# Patient Record
Sex: Female | Born: 1960
Health system: Southern US, Community
[De-identification: ages and names within clinical notes are randomized; demographics above are authoritative.]

## PROBLEM LIST (undated history)

## (undated) DIAGNOSIS — M51379 Other intervertebral disc degeneration, lumbosacral region without mention of lumbar back pain or lower extremity pain: Secondary | ICD-10-CM

## (undated) DIAGNOSIS — E785 Hyperlipidemia, unspecified: Secondary | ICD-10-CM

## (undated) DIAGNOSIS — M5382 Other specified dorsopathies, cervical region: Secondary | ICD-10-CM

## (undated) DIAGNOSIS — M47816 Spondylosis without myelopathy or radiculopathy, lumbar region: Secondary | ICD-10-CM

## (undated) DIAGNOSIS — T7840XA Allergy, unspecified, initial encounter: Secondary | ICD-10-CM

## (undated) DIAGNOSIS — J449 Chronic obstructive pulmonary disease, unspecified: Secondary | ICD-10-CM

## (undated) DIAGNOSIS — G894 Chronic pain syndrome: Secondary | ICD-10-CM

## (undated) DIAGNOSIS — M1710 Unilateral primary osteoarthritis, unspecified knee: Secondary | ICD-10-CM

## (undated) DIAGNOSIS — G43019 Migraine without aura, intractable, without status migrainosus: Secondary | ICD-10-CM

## (undated) DIAGNOSIS — F32A Depression, unspecified: Secondary | ICD-10-CM

## (undated) DIAGNOSIS — F329 Major depressive disorder, single episode, unspecified: Secondary | ICD-10-CM

## (undated) DIAGNOSIS — M5106 Intervertebral disc disorders with myelopathy, lumbar region: Secondary | ICD-10-CM

## (undated) DIAGNOSIS — K219 Gastro-esophageal reflux disease without esophagitis: Secondary | ICD-10-CM

## (undated) DIAGNOSIS — E079 Disorder of thyroid, unspecified: Secondary | ICD-10-CM

## (undated) DIAGNOSIS — F419 Anxiety disorder, unspecified: Secondary | ICD-10-CM

## (undated) DIAGNOSIS — M47817 Spondylosis without myelopathy or radiculopathy, lumbosacral region: Secondary | ICD-10-CM

## (undated) DIAGNOSIS — C801 Malignant (primary) neoplasm, unspecified: Secondary | ICD-10-CM

## (undated) DIAGNOSIS — M5137 Other intervertebral disc degeneration, lumbosacral region: Secondary | ICD-10-CM

## (undated) HISTORY — PX: OTHER SURGICAL HISTORY: SHX169

## (undated) HISTORY — DX: Depression, unspecified: F32.A

## (undated) HISTORY — DX: Disorder of thyroid, unspecified: E07.9

## (undated) HISTORY — DX: Hyperlipidemia, unspecified: E78.5

## (undated) HISTORY — PX: COLONOSCOPY: SHX174

## (undated) HISTORY — DX: Migraine without aura, intractable, without status migrainosus: G43.019

## (undated) HISTORY — DX: Malignant (primary) neoplasm, unspecified: C80.1

## (undated) HISTORY — DX: Major depressive disorder, single episode, unspecified: F32.9

## (undated) HISTORY — DX: Other intervertebral disc degeneration, lumbosacral region: M51.37

## (undated) HISTORY — DX: Allergy, unspecified, initial encounter: T78.40XA

## (undated) HISTORY — DX: Chronic obstructive pulmonary disease, unspecified: J44.9

## (undated) HISTORY — DX: Spondylosis without myelopathy or radiculopathy, lumbar region: M47.816

## (undated) HISTORY — DX: Other specified dorsopathies, cervical region: M53.82

## (undated) HISTORY — PX: JOINT REPLACEMENT: SHX530

## (undated) HISTORY — DX: Intervertebral disc disorders with myelopathy, lumbar region: M51.06

## (undated) HISTORY — PX: BREAST CYST ASPIRATION: SHX578

## (undated) HISTORY — PX: THYROID SURGERY: SHX805

## (undated) HISTORY — PX: KNEE ARTHROPLASTY: SHX992

## (undated) HISTORY — DX: Other intervertebral disc degeneration, lumbosacral region without mention of lumbar back pain or lower extremity pain: M51.379

## (undated) HISTORY — DX: Anxiety disorder, unspecified: F41.9

## (undated) HISTORY — DX: Spondylosis without myelopathy or radiculopathy, lumbosacral region: M47.817

## (undated) HISTORY — DX: Unilateral primary osteoarthritis, unspecified knee: M17.10

## (undated) HISTORY — DX: Gastro-esophageal reflux disease without esophagitis: K21.9

## (undated) HISTORY — DX: Chronic pain syndrome: G89.4

## (undated) HISTORY — PX: COSMETIC SURGERY: SHX468

---
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
## 1993-03-09 HISTORY — PX: OTHER SURGICAL HISTORY: SHX169

## 1993-07-07 HISTORY — PX: CARPAL TUNNEL RELEASE: SHX101

## 1997-10-07 HISTORY — PX: TUBAL LIGATION: SHX77

## 2001-08-06 HISTORY — PX: HEMORROIDECTOMY: SUR656

## 2002-04-30 ENCOUNTER — Encounter: Admission: RE | Admit: 2002-04-30 | Discharge: 2002-07-29 | Payer: Self-pay

## 2002-06-07 HISTORY — PX: ABDOMINAL HYSTERECTOMY: SHX81

## 2002-08-26 ENCOUNTER — Encounter
Admission: RE | Admit: 2002-08-26 | Discharge: 2002-11-24 | Payer: Self-pay | Admitting: Physical Medicine & Rehabilitation

## 2003-01-21 ENCOUNTER — Encounter
Admission: RE | Admit: 2003-01-21 | Discharge: 2003-04-21 | Payer: Self-pay | Admitting: Physical Medicine & Rehabilitation

## 2003-02-07 HISTORY — PX: KNEE ARTHROSCOPY: SUR90

## 2003-05-27 ENCOUNTER — Encounter: Admission: RE | Admit: 2003-05-27 | Discharge: 2003-08-25 | Payer: Self-pay | Admitting: Family Medicine

## 2003-09-09 ENCOUNTER — Encounter
Admission: RE | Admit: 2003-09-09 | Discharge: 2003-11-09 | Payer: Self-pay | Admitting: Physical Medicine & Rehabilitation

## 2003-11-09 ENCOUNTER — Encounter
Admission: RE | Admit: 2003-11-09 | Discharge: 2004-02-07 | Payer: Self-pay | Admitting: Physical Medicine & Rehabilitation

## 2003-11-10 ENCOUNTER — Ambulatory Visit: Payer: Self-pay | Admitting: Physical Medicine & Rehabilitation

## 2003-12-08 HISTORY — PX: REPLACEMENT TOTAL KNEE: SUR1224

## 2003-12-25 ENCOUNTER — Inpatient Hospital Stay (HOSPITAL_COMMUNITY): Admission: RE | Admit: 2003-12-25 | Discharge: 2003-12-28 | Payer: Self-pay | Admitting: Orthopedic Surgery

## 2004-03-10 ENCOUNTER — Encounter
Admission: RE | Admit: 2004-03-10 | Discharge: 2004-06-03 | Payer: Self-pay | Admitting: Physical Medicine & Rehabilitation

## 2004-03-11 ENCOUNTER — Ambulatory Visit: Payer: Self-pay | Admitting: Physical Medicine & Rehabilitation

## 2004-06-03 ENCOUNTER — Encounter
Admission: RE | Admit: 2004-06-03 | Discharge: 2004-09-01 | Payer: Self-pay | Admitting: Physical Medicine & Rehabilitation

## 2004-06-07 ENCOUNTER — Ambulatory Visit: Payer: Self-pay | Admitting: Physical Medicine & Rehabilitation

## 2004-09-16 ENCOUNTER — Ambulatory Visit: Payer: Self-pay | Admitting: Physical Medicine & Rehabilitation

## 2004-09-16 ENCOUNTER — Encounter
Admission: RE | Admit: 2004-09-16 | Discharge: 2004-12-15 | Payer: Self-pay | Admitting: Physical Medicine & Rehabilitation

## 2004-10-27 ENCOUNTER — Encounter
Admission: RE | Admit: 2004-10-27 | Discharge: 2004-10-27 | Payer: Self-pay | Admitting: Physical Medicine and Rehabilitation

## 2004-11-07 ENCOUNTER — Other Ambulatory Visit: Admission: RE | Admit: 2004-11-07 | Discharge: 2004-11-07 | Payer: Self-pay | Admitting: Interventional Radiology

## 2004-11-07 ENCOUNTER — Encounter
Admission: RE | Admit: 2004-11-07 | Discharge: 2004-11-07 | Payer: Self-pay | Admitting: Physical Medicine and Rehabilitation

## 2004-11-07 ENCOUNTER — Encounter (INDEPENDENT_AMBULATORY_CARE_PROVIDER_SITE_OTHER): Payer: Self-pay | Admitting: Specialist

## 2004-11-17 ENCOUNTER — Ambulatory Visit: Payer: Self-pay | Admitting: Physical Medicine & Rehabilitation

## 2005-01-06 HISTORY — PX: THYROID LOBECTOMY: SHX420

## 2005-01-12 ENCOUNTER — Encounter (INDEPENDENT_AMBULATORY_CARE_PROVIDER_SITE_OTHER): Payer: Self-pay | Admitting: Specialist

## 2005-01-12 ENCOUNTER — Ambulatory Visit (HOSPITAL_COMMUNITY): Admission: RE | Admit: 2005-01-12 | Discharge: 2005-01-13 | Payer: Self-pay | Admitting: Surgery

## 2005-04-05 ENCOUNTER — Ambulatory Visit (HOSPITAL_COMMUNITY): Admission: RE | Admit: 2005-04-05 | Discharge: 2005-04-05 | Payer: Self-pay | Admitting: Surgery

## 2005-04-10 ENCOUNTER — Ambulatory Visit (HOSPITAL_COMMUNITY): Admission: RE | Admit: 2005-04-10 | Discharge: 2005-04-10 | Payer: Self-pay

## 2005-06-26 ENCOUNTER — Encounter (INDEPENDENT_AMBULATORY_CARE_PROVIDER_SITE_OTHER): Payer: Self-pay | Admitting: Specialist

## 2005-06-26 ENCOUNTER — Ambulatory Visit (HOSPITAL_BASED_OUTPATIENT_CLINIC_OR_DEPARTMENT_OTHER): Admission: RE | Admit: 2005-06-26 | Discharge: 2005-06-26 | Payer: Self-pay | Admitting: Plastic Surgery

## 2005-06-28 ENCOUNTER — Ambulatory Visit (HOSPITAL_BASED_OUTPATIENT_CLINIC_OR_DEPARTMENT_OTHER): Admission: RE | Admit: 2005-06-28 | Discharge: 2005-06-28 | Payer: Self-pay | Admitting: Plastic Surgery

## 2005-07-11 ENCOUNTER — Ambulatory Visit (HOSPITAL_BASED_OUTPATIENT_CLINIC_OR_DEPARTMENT_OTHER): Admission: RE | Admit: 2005-07-11 | Discharge: 2005-07-11 | Payer: Self-pay | Admitting: Plastic Surgery

## 2005-07-11 ENCOUNTER — Encounter (INDEPENDENT_AMBULATORY_CARE_PROVIDER_SITE_OTHER): Payer: Self-pay | Admitting: *Deleted

## 2005-08-02 ENCOUNTER — Ambulatory Visit (HOSPITAL_BASED_OUTPATIENT_CLINIC_OR_DEPARTMENT_OTHER): Admission: RE | Admit: 2005-08-02 | Discharge: 2005-08-02 | Payer: Self-pay | Admitting: Plastic Surgery

## 2005-08-29 ENCOUNTER — Encounter: Admission: RE | Admit: 2005-08-29 | Discharge: 2005-08-29 | Payer: Self-pay | Admitting: Surgery

## 2005-11-06 ENCOUNTER — Encounter
Admission: RE | Admit: 2005-11-06 | Discharge: 2006-02-04 | Payer: Self-pay | Admitting: Physical Medicine & Rehabilitation

## 2005-11-06 ENCOUNTER — Ambulatory Visit: Payer: Self-pay | Admitting: Physical Medicine & Rehabilitation

## 2005-12-04 ENCOUNTER — Encounter
Admission: RE | Admit: 2005-12-04 | Discharge: 2006-03-04 | Payer: Self-pay | Admitting: Physical Medicine & Rehabilitation

## 2005-12-27 ENCOUNTER — Encounter
Admission: RE | Admit: 2005-12-27 | Discharge: 2006-03-27 | Payer: Self-pay | Admitting: Physical Medicine & Rehabilitation

## 2006-01-01 ENCOUNTER — Ambulatory Visit: Payer: Self-pay | Admitting: Physical Medicine & Rehabilitation

## 2006-02-07 ENCOUNTER — Encounter
Admission: RE | Admit: 2006-02-07 | Discharge: 2006-05-08 | Payer: Self-pay | Admitting: Physical Medicine & Rehabilitation

## 2006-02-15 ENCOUNTER — Ambulatory Visit: Payer: Self-pay | Admitting: Physical Medicine & Rehabilitation

## 2006-02-28 ENCOUNTER — Encounter: Admission: RE | Admit: 2006-02-28 | Discharge: 2006-02-28 | Payer: Self-pay | Admitting: Surgery

## 2006-03-15 ENCOUNTER — Encounter
Admission: RE | Admit: 2006-03-15 | Discharge: 2006-06-13 | Payer: Self-pay | Admitting: Physical Medicine & Rehabilitation

## 2006-03-19 ENCOUNTER — Ambulatory Visit: Payer: Self-pay | Admitting: Physical Medicine & Rehabilitation

## 2006-04-02 ENCOUNTER — Ambulatory Visit: Payer: Self-pay | Admitting: Physical Medicine & Rehabilitation

## 2006-04-13 ENCOUNTER — Ambulatory Visit: Payer: Self-pay | Admitting: Physical Medicine & Rehabilitation

## 2006-06-20 ENCOUNTER — Ambulatory Visit: Payer: Self-pay | Admitting: Physical Medicine & Rehabilitation

## 2006-06-20 ENCOUNTER — Encounter
Admission: RE | Admit: 2006-06-20 | Discharge: 2006-09-18 | Payer: Self-pay | Admitting: Physical Medicine & Rehabilitation

## 2006-07-23 ENCOUNTER — Ambulatory Visit: Payer: Self-pay | Admitting: Physical Medicine & Rehabilitation

## 2006-09-19 ENCOUNTER — Encounter
Admission: RE | Admit: 2006-09-19 | Discharge: 2006-10-18 | Payer: Self-pay | Admitting: Physical Medicine & Rehabilitation

## 2006-09-20 ENCOUNTER — Ambulatory Visit: Payer: Self-pay | Admitting: Physical Medicine & Rehabilitation

## 2006-11-19 ENCOUNTER — Encounter
Admission: RE | Admit: 2006-11-19 | Discharge: 2007-02-17 | Payer: Self-pay | Admitting: Physical Medicine & Rehabilitation

## 2006-11-29 ENCOUNTER — Ambulatory Visit: Payer: Self-pay | Admitting: Physical Medicine & Rehabilitation

## 2006-12-13 ENCOUNTER — Inpatient Hospital Stay (HOSPITAL_COMMUNITY): Admission: RE | Admit: 2006-12-13 | Discharge: 2006-12-16 | Payer: Self-pay | Admitting: Orthopedic Surgery

## 2007-02-12 ENCOUNTER — Ambulatory Visit: Payer: Self-pay | Admitting: Physical Medicine & Rehabilitation

## 2007-03-15 ENCOUNTER — Encounter
Admission: RE | Admit: 2007-03-15 | Discharge: 2007-03-18 | Payer: Self-pay | Admitting: Physical Medicine & Rehabilitation

## 2007-03-15 ENCOUNTER — Ambulatory Visit: Payer: Self-pay | Admitting: Physical Medicine & Rehabilitation

## 2007-05-15 ENCOUNTER — Ambulatory Visit: Payer: Self-pay | Admitting: Physical Medicine & Rehabilitation

## 2007-05-15 ENCOUNTER — Encounter
Admission: RE | Admit: 2007-05-15 | Discharge: 2007-05-15 | Payer: Self-pay | Admitting: Physical Medicine & Rehabilitation

## 2007-08-02 ENCOUNTER — Encounter
Admission: RE | Admit: 2007-08-02 | Discharge: 2007-10-31 | Payer: Self-pay | Admitting: Physical Medicine & Rehabilitation

## 2007-08-02 ENCOUNTER — Ambulatory Visit: Payer: Self-pay | Admitting: Physical Medicine & Rehabilitation

## 2007-09-09 ENCOUNTER — Ambulatory Visit: Payer: Self-pay | Admitting: Physical Medicine & Rehabilitation

## 2007-10-16 ENCOUNTER — Ambulatory Visit: Payer: Self-pay | Admitting: Physical Medicine & Rehabilitation

## 2007-11-11 ENCOUNTER — Encounter
Admission: RE | Admit: 2007-11-11 | Discharge: 2008-02-09 | Payer: Self-pay | Admitting: Physical Medicine & Rehabilitation

## 2007-11-12 ENCOUNTER — Ambulatory Visit: Payer: Self-pay | Admitting: Physical Medicine & Rehabilitation

## 2007-12-10 ENCOUNTER — Ambulatory Visit: Payer: Self-pay | Admitting: Physical Medicine & Rehabilitation

## 2008-01-10 ENCOUNTER — Ambulatory Visit: Payer: Self-pay | Admitting: Physical Medicine & Rehabilitation

## 2008-02-11 ENCOUNTER — Encounter
Admission: RE | Admit: 2008-02-11 | Discharge: 2008-05-11 | Payer: Self-pay | Admitting: Physical Medicine & Rehabilitation

## 2008-02-12 ENCOUNTER — Ambulatory Visit: Payer: Self-pay | Admitting: Physical Medicine & Rehabilitation

## 2008-03-11 ENCOUNTER — Ambulatory Visit: Payer: Self-pay | Admitting: Physical Medicine & Rehabilitation

## 2008-03-20 ENCOUNTER — Encounter: Admission: RE | Admit: 2008-03-20 | Discharge: 2008-03-20 | Payer: Self-pay | Admitting: Surgery

## 2008-04-08 ENCOUNTER — Ambulatory Visit: Payer: Self-pay | Admitting: Physical Medicine & Rehabilitation

## 2008-05-05 ENCOUNTER — Encounter
Admission: RE | Admit: 2008-05-05 | Discharge: 2008-08-03 | Payer: Self-pay | Admitting: Physical Medicine & Rehabilitation

## 2008-05-11 ENCOUNTER — Ambulatory Visit: Payer: Self-pay | Admitting: Physical Medicine & Rehabilitation

## 2008-06-05 ENCOUNTER — Ambulatory Visit: Payer: Self-pay | Admitting: Physical Medicine & Rehabilitation

## 2008-07-03 ENCOUNTER — Ambulatory Visit: Payer: Self-pay | Admitting: Physical Medicine & Rehabilitation

## 2008-07-30 ENCOUNTER — Encounter
Admission: RE | Admit: 2008-07-30 | Discharge: 2008-10-28 | Payer: Self-pay | Admitting: Physical Medicine & Rehabilitation

## 2008-08-04 ENCOUNTER — Ambulatory Visit: Payer: Self-pay | Admitting: Physical Medicine & Rehabilitation

## 2008-09-02 ENCOUNTER — Ambulatory Visit: Payer: Self-pay | Admitting: Physical Medicine & Rehabilitation

## 2008-10-02 ENCOUNTER — Ambulatory Visit: Payer: Self-pay | Admitting: Physical Medicine & Rehabilitation

## 2008-10-30 ENCOUNTER — Encounter
Admission: RE | Admit: 2008-10-30 | Discharge: 2009-01-28 | Payer: Self-pay | Admitting: Physical Medicine & Rehabilitation

## 2008-11-02 ENCOUNTER — Ambulatory Visit: Payer: Self-pay | Admitting: Physical Medicine & Rehabilitation

## 2008-12-01 ENCOUNTER — Ambulatory Visit: Payer: Self-pay | Admitting: Physical Medicine & Rehabilitation

## 2008-12-29 ENCOUNTER — Ambulatory Visit: Payer: Self-pay | Admitting: Physical Medicine & Rehabilitation

## 2009-01-27 ENCOUNTER — Ambulatory Visit: Payer: Self-pay | Admitting: Physical Medicine & Rehabilitation

## 2009-02-18 ENCOUNTER — Encounter
Admission: RE | Admit: 2009-02-18 | Discharge: 2009-05-19 | Payer: Self-pay | Admitting: Physical Medicine & Rehabilitation

## 2009-02-24 ENCOUNTER — Ambulatory Visit: Payer: Self-pay | Admitting: Physical Medicine & Rehabilitation

## 2009-03-25 ENCOUNTER — Ambulatory Visit: Payer: Self-pay | Admitting: Physical Medicine & Rehabilitation

## 2009-04-23 ENCOUNTER — Ambulatory Visit: Payer: Self-pay | Admitting: Physical Medicine & Rehabilitation

## 2009-05-19 ENCOUNTER — Ambulatory Visit: Payer: Self-pay | Admitting: Physical Medicine & Rehabilitation

## 2009-06-15 ENCOUNTER — Encounter
Admission: RE | Admit: 2009-06-15 | Discharge: 2009-09-13 | Payer: Self-pay | Admitting: Physical Medicine & Rehabilitation

## 2009-06-17 ENCOUNTER — Ambulatory Visit: Payer: Self-pay | Admitting: Physical Medicine & Rehabilitation

## 2009-07-15 ENCOUNTER — Ambulatory Visit: Payer: Self-pay | Admitting: Physical Medicine & Rehabilitation

## 2009-08-13 ENCOUNTER — Ambulatory Visit: Payer: Self-pay | Admitting: Physical Medicine & Rehabilitation

## 2009-09-16 ENCOUNTER — Ambulatory Visit: Payer: Self-pay | Admitting: Physical Medicine & Rehabilitation

## 2009-09-16 ENCOUNTER — Encounter
Admission: RE | Admit: 2009-09-16 | Discharge: 2009-12-15 | Payer: Self-pay | Admitting: Physical Medicine & Rehabilitation

## 2009-10-14 ENCOUNTER — Ambulatory Visit: Payer: Self-pay | Admitting: Physical Medicine & Rehabilitation

## 2009-11-15 ENCOUNTER — Ambulatory Visit: Payer: Self-pay | Admitting: Physical Medicine & Rehabilitation

## 2009-12-09 ENCOUNTER — Ambulatory Visit: Payer: Self-pay | Admitting: Physical Medicine & Rehabilitation

## 2010-01-04 ENCOUNTER — Encounter
Admission: RE | Admit: 2010-01-04 | Discharge: 2010-02-25 | Payer: Self-pay | Source: Home / Self Care | Attending: Physical Medicine & Rehabilitation | Admitting: Physical Medicine & Rehabilitation

## 2010-01-06 ENCOUNTER — Ambulatory Visit: Payer: Self-pay | Admitting: Physical Medicine & Rehabilitation

## 2010-02-03 ENCOUNTER — Ambulatory Visit: Payer: Self-pay | Admitting: Physical Medicine & Rehabilitation

## 2010-02-03 DIAGNOSIS — M47816 Spondylosis without myelopathy or radiculopathy, lumbar region: Secondary | ICD-10-CM

## 2010-02-25 ENCOUNTER — Encounter
Admission: RE | Admit: 2010-02-25 | Discharge: 2010-03-02 | Payer: Self-pay | Source: Home / Self Care | Attending: Physical Medicine & Rehabilitation | Admitting: Physical Medicine & Rehabilitation

## 2010-03-02 ENCOUNTER — Ambulatory Visit
Admission: RE | Admit: 2010-03-02 | Discharge: 2010-03-02 | Payer: Self-pay | Source: Home / Self Care | Attending: Physical Medicine & Rehabilitation | Admitting: Physical Medicine & Rehabilitation

## 2010-03-31 ENCOUNTER — Ambulatory Visit: Payer: Self-pay

## 2010-04-07 ENCOUNTER — Encounter: Payer: Medicare Other | Attending: Physical Medicine & Rehabilitation

## 2010-04-07 ENCOUNTER — Ambulatory Visit

## 2010-04-07 DIAGNOSIS — G8929 Other chronic pain: Secondary | ICD-10-CM | POA: Insufficient documentation

## 2010-04-07 DIAGNOSIS — M1710 Unilateral primary osteoarthritis, unspecified knee: Secondary | ICD-10-CM | POA: Insufficient documentation

## 2010-04-07 DIAGNOSIS — M47817 Spondylosis without myelopathy or radiculopathy, lumbosacral region: Secondary | ICD-10-CM | POA: Insufficient documentation

## 2010-04-07 DIAGNOSIS — M5382 Other specified dorsopathies, cervical region: Secondary | ICD-10-CM

## 2010-04-07 DIAGNOSIS — F17200 Nicotine dependence, unspecified, uncomplicated: Secondary | ICD-10-CM | POA: Insufficient documentation

## 2010-04-07 DIAGNOSIS — M129 Arthropathy, unspecified: Secondary | ICD-10-CM | POA: Insufficient documentation

## 2010-04-07 DIAGNOSIS — M5137 Other intervertebral disc degeneration, lumbosacral region: Secondary | ICD-10-CM

## 2010-04-07 DIAGNOSIS — G43909 Migraine, unspecified, not intractable, without status migrainosus: Secondary | ICD-10-CM | POA: Insufficient documentation

## 2010-04-07 DIAGNOSIS — M545 Low back pain, unspecified: Secondary | ICD-10-CM | POA: Insufficient documentation

## 2010-04-07 DIAGNOSIS — M51379 Other intervertebral disc degeneration, lumbosacral region without mention of lumbar back pain or lower extremity pain: Secondary | ICD-10-CM

## 2010-04-07 DIAGNOSIS — M5380 Other specified dorsopathies, site unspecified: Secondary | ICD-10-CM

## 2010-05-10 ENCOUNTER — Encounter: Payer: Medicare Other | Attending: Physical Medicine & Rehabilitation

## 2010-05-10 DIAGNOSIS — M79609 Pain in unspecified limb: Secondary | ICD-10-CM | POA: Insufficient documentation

## 2010-05-10 DIAGNOSIS — F341 Dysthymic disorder: Secondary | ICD-10-CM | POA: Insufficient documentation

## 2010-05-10 DIAGNOSIS — M1710 Unilateral primary osteoarthritis, unspecified knee: Secondary | ICD-10-CM

## 2010-05-10 DIAGNOSIS — M5380 Other specified dorsopathies, site unspecified: Secondary | ICD-10-CM

## 2010-05-10 DIAGNOSIS — G43909 Migraine, unspecified, not intractable, without status migrainosus: Secondary | ICD-10-CM | POA: Insufficient documentation

## 2010-05-10 DIAGNOSIS — M5137 Other intervertebral disc degeneration, lumbosacral region: Secondary | ICD-10-CM

## 2010-05-10 DIAGNOSIS — M5382 Other specified dorsopathies, cervical region: Secondary | ICD-10-CM

## 2010-05-10 DIAGNOSIS — M545 Low back pain, unspecified: Secondary | ICD-10-CM | POA: Insufficient documentation

## 2010-05-10 DIAGNOSIS — M47817 Spondylosis without myelopathy or radiculopathy, lumbosacral region: Secondary | ICD-10-CM | POA: Insufficient documentation

## 2010-05-10 DIAGNOSIS — Z79899 Other long term (current) drug therapy: Secondary | ICD-10-CM | POA: Insufficient documentation

## 2010-05-10 DIAGNOSIS — M51379 Other intervertebral disc degeneration, lumbosacral region without mention of lumbar back pain or lower extremity pain: Secondary | ICD-10-CM

## 2010-06-08 ENCOUNTER — Encounter: Payer: Medicare Other | Attending: Physical Medicine & Rehabilitation | Admitting: Physical Medicine & Rehabilitation

## 2010-06-08 DIAGNOSIS — F329 Major depressive disorder, single episode, unspecified: Secondary | ICD-10-CM

## 2010-06-08 DIAGNOSIS — M25579 Pain in unspecified ankle and joints of unspecified foot: Secondary | ICD-10-CM | POA: Insufficient documentation

## 2010-06-08 DIAGNOSIS — M545 Low back pain, unspecified: Secondary | ICD-10-CM | POA: Insufficient documentation

## 2010-06-08 DIAGNOSIS — Z96659 Presence of unspecified artificial knee joint: Secondary | ICD-10-CM | POA: Insufficient documentation

## 2010-06-08 DIAGNOSIS — G43909 Migraine, unspecified, not intractable, without status migrainosus: Secondary | ICD-10-CM | POA: Insufficient documentation

## 2010-06-08 DIAGNOSIS — M1710 Unilateral primary osteoarthritis, unspecified knee: Secondary | ICD-10-CM | POA: Insufficient documentation

## 2010-06-08 DIAGNOSIS — G8929 Other chronic pain: Secondary | ICD-10-CM | POA: Insufficient documentation

## 2010-06-08 DIAGNOSIS — M25569 Pain in unspecified knee: Secondary | ICD-10-CM | POA: Insufficient documentation

## 2010-06-08 DIAGNOSIS — M5380 Other specified dorsopathies, site unspecified: Secondary | ICD-10-CM

## 2010-06-08 DIAGNOSIS — M129 Arthropathy, unspecified: Secondary | ICD-10-CM | POA: Insufficient documentation

## 2010-06-08 DIAGNOSIS — M47817 Spondylosis without myelopathy or radiculopathy, lumbosacral region: Secondary | ICD-10-CM | POA: Insufficient documentation

## 2010-06-08 DIAGNOSIS — G43019 Migraine without aura, intractable, without status migrainosus: Secondary | ICD-10-CM

## 2010-06-09 NOTE — Assessment & Plan Note (Signed)
[name] is back regarding her low back and leg pain.  She continues to do well with the osteoarthritis brace for left knee, feels more stable, and has more independence.  She recently purchased a new cane, that is able stand up on its own and swivels with a small base, and she has been happy with this as well.  She did notice significant  improvement with the increase in Neurontin.  Sleep has been poor.  Mood has been generally at baseline.  She rates her pain overall 7/10.  REVIEW OF SYSTEMS:  Notable for back spasms, occasional numbness, trouble walking, anxiety, depression, confusion, constipation, poor appetite.  Full 12-point review is in the written health and history section of the chart.  SOCIAL HISTORY:  The patient is married, living with her spouse, still smoking half-pack of cigarettes per day.  She does state that she and her husband are now serious about trying to quit.  PHYSICAL EXAMINATION:  VITAL SIGNS:  Blood pressure is 146/70, pulse 126, respiratory rate 18, she is satting 96% on room air. GENERAL:  The patient is pleasant, alert.  Weight is stable.  Knee is unchanged with some generalized swelling.  Color is intact as well as temperature.  She walked with much better shift on the left side today. She still does not bend the knee enough in stance phase, but overall shift at that side is much improved. HEART:  Regular. CHEST:  Clear. ABDOMEN:  Soft, nontender. NEUROLOGIC:  Cognitively, she is alert and appropriate.  Cranial nerve exam is intact.  ASSESSMENT: 1. Chronic low back pain with lumbar spondylosis and facet     arthropathy. 2. Osteoarthritis of bilateral knees, left greater than right. 3. History of migraine headaches.  PLAN: 1. We will trial propranolol 10 mg b.i.d. for 1 week, then t.i.d.     thereafter.  She may stay with the Neurontin as well. 2. Encouraged smoking cessation efforts as these will impact her     headaches as well. 3. I  refilled her MS Contin 100 mg q.12 hours #60 as well oxycodone 15     mg one q.6 hours p.r.n. #120. 4. I will see the patient back here in about 3 months.  I will have     her followup with the nurse practitioner in 1 month.  She will call     about 2 weeks to give me an update regarding her trial of     propranolol.     Ranelle Oyster, M.D. Electronically Signed    ZTS/MedQ D:  06/08/2010 12:28:55  T:  06/09/2010 00:23:42  Job #:  [id number]  cc:   Jonny Ruiz L. Rendall, M.D. Fax: [phone]  Hewitt Shorts

## 2010-06-21 NOTE — Assessment & Plan Note (Signed)
[name] is back regarding her back and leg pain.  She went back on  Lyrica and has not noticed much of a change.  She had continued with  Tofranil and Cymbalta as well.  She does feel that the Opana immediate  release and extended release are helping her especially at the 15 mg  immediate release dose  p.r.n.  She is still smoking.  Her life has not  changed dramatically.  She is not involved in any type of social or  recreational activities at this point.  She rates her pain at 5 to 6/10  on the average.  She describes as a sharp, burning, stabbing, aching,  and constant.  Pain is related to walking and bending and in most  standing activities.  She can walk 5-8 minutes without stopping using  her cane.  She has not followed up with surgery recently.  Essentially  not much has changed.   REVIEW OF SYSTEMS:  Notable for bladder control issue, numbness,  tingling, back spasm, trouble walking, knee pain, weight gain, nausea,  vomiting, constipation, and poor appetite.   SOCIAL HISTORY:  The patient is married, living with her spouse and  still smoking a pack a day of cigarettes.   PHYSICAL EXAMINATION:  VITAL SIGNS:  Blood pressure is 108/63, pulse is  107, and respiratory rate 18.  She is sating 94% on room air.  GENERAL:  The patient is a pleasant, alert, and oriented.  She remains  antalgic on the left.  She has pain in the back with range of motion  today.  Leg is chronically discolored per routine with no allodynia or  temperature changes.  She has some edema still around the surgical site.  HEART:  Regular.  CHEST:  Clear.  ABDOMEN:  Soft and nontender, and she remains overweight.   ASSESSMENT:  1. Chronic low back pain with a facet arthropathy and degenerative      disk disease.  2. Questionable left sacroiliac joint involvement.  3. Chronic osteoarthritis of the knees, status post left total knee      replacement, November 2006.   PLAN:  1. Continue Lyrica for now.  2.  We will transition over from Cymbalta to [facility] to see if we could      improve her mood and energy.  Her life seems to continue centering      around her pain.  Until she is able to shift her focus somewhat      things will be difficult, what I believe for her.  3. Continue Opana ER/IR with 40 mg and 10 mg doses respectively.  We      wrote prescriptions for #90 and #180 today.  4. I will see her back in about 6 weeks' time.  Consider Lyrica taper      and trial of another anticonvulsant.      Ranelle Oyster, M.D.  Electronically Signed     ZTS/MedQ  D:  08/02/2007 17:26:35  T:  08/03/2007 08:19:54  Job #:  [id number]   cc:   Jonny Ruiz L. Rendall, M.D.  Fax: [phone]

## 2010-06-21 NOTE — Procedures (Signed)
NAMEHARLEEN, [name] NO.:  [id number]   MEDICAL RECORD NO.:  [id number]          PATIENT TYPE:  REC   LOCATION:  [facility]                          FACILITY:  [facility]   PHYSICIAN:  Erick Colace, M.D.DATE OF BIRTH:  03-25-1960   DATE OF PROCEDURE:  10/18/2006  DATE OF DISCHARGE:                               OPERATIVE REPORT   PROCEDURE:  Bilateral sacroiliac injection.   INDICATIONS:  Low back, buttock pain.  Has had complete relief for 2  weeks with left SI injection, now feels to be wearing off.  She does  note that her right side appears to be more painful.  She has had rest  pain at 4 out of 10, however, with activity 8 out of 10.  Her pain is  only partially responsive to narcotic analgesic medications and not to  conservative care   Informed consent was obtained after describing risks and benefits of the  procedure to the patient.  These include bleeding, bruising, infection,  loss of bowel or bladder function, temporary or permanent paralysis.  She elects to proceed and has given written consent.  The patient was  placed prone on fluoroscopy table.  Betadine prep and sterile drape.  A  25-gauge inch and a half needle was used to anesthetize and skin and  subcu tissue with 1% lidocaine x2 mL and 25 gauge 3 inch spinal needle  was inserted first in the left SI joint.  AP and lateral imaging  utilized.  Omnipaque 180 x 0.5 mL demonstrated no intravascular uptake  then 1.5 mL of a solution containing 1 mL of 40 mg per mL Depo-Medrol  and 2 mL of 2% MPF lidocaine was injected.  The same procedure was  repeated on the right side and the same injectate, same technique and  equipment.  The patient tolerated procedure well.  Pre and post  injection vitals stable.  Post injection instructions given.  She will  follow up with Dr. Riley Morales next month and as I explained to the patient  can have 3 to 4 of these injections per year.  Would like to hold off  for another 2  to 3 months prior to the next one.  This was discussed  briefly on radiofrequency of sacroiliac although this is less reliable  than lumbar radiofrequency.  Pre injection pain level 3-4/10.  Post  injection 1/10.      Erick Colace, M.D.  Electronically Signed     AEK/MEDQ  D:  10/18/2006 14:58:01  T:  10/19/2006 09:36:48  Job:  [id number]

## 2010-06-21 NOTE — Assessment & Plan Note (Signed)
[name] is back regarding her chronic back and knee pain.  She is  complaining of some left leg pain radiating from her back down into her  foot.  The knee pain is there still as well.  She rates her pain at a 4  to 5 out of 10.  She states that it has been better at times.  She calls  the pain sharp, burning, stabbing, tingling, and aching.  The pain  bothers her most when walking, bending, and standing.  We recently  increased her Opana in March to 40 mg q.8h and seems to be helping  somewhat with Oxycodone for breakthrough pain.  She maintains on  Neurontin and Cymbalta as well.  Sleep remains poor.   She is awaiting surgery at some point from Dr. Priscille Kluver.  She tells me  Dr. Priscille Kluver is waiting for her pain to be substantially improved before  entering surgery.   REVIEW OF SYSTEMS:  Notable for bladder control problems, weakness,  confusion, depression, tingling, trouble walking, spasms.  She also has  had some suicidal thoughts and would like some help in dealing with  these.  The patient reports weight gain, nausea, constipation, poor  appetite, limb swelling.   SOCIAL HISTORY:  The patient is married and living with her spouse.  She  still smokes one-and-a-half packs of cigarettes per day.   PHYSICAL EXAMINATION:  VITAL SIGNS:  Blood pressure is 106/67, pulse is  102, respiratory rate 16, her saturation is 95% on room air.  GENERAL:  The patient is pleasant, alert and oriented x3.  Her affect is  a bit flat.  EXTREMITIES:  She walks with a limp in the left leg.  She has crepitus  and pain in the left knee with extension and flexion, particularly with  resistance.  She has pain with meniscal maneuvers.  No obvious knee  instability was seen.  She has sensitivity over the distal anterior  thigh as well as the medial leg across the knee joint.  I would not call  this frank allodynia.  There is no obvious temperature change.  The area  is slightly reddened, but not significantly  so.  The legs are edematous  at 1+ bilaterally.  The back is stable with extension and flexion.  Straight leg testing and seated slump test were equivocal.  HEART:  Regular rhythm, slightly tachycardic.  CHEST:  Clear.  ABDOMEN:  Soft and nontender.   ASSESSMENT:  1. Chronic osteoarthritis of the knee, status post previous surgery,      with pending left total knee replacement.  2. Chronic low back pain and facet arthropathy.   PLAN:  1. Continue with Opana at 40 mg q.8h and Oxycodone 15 mg q.4-6h p.r.n.  2. Will switch from Neurontin over to Lyrica, transitioning over, over      the next month's time.  3. Begin a trial of Tofranil-PM 75 mg q.h.s.  4. Cymbalta will remain at 60 mg daily to assist with neuropathic      pain.  5. May consider potentially a lumbar sympathetic ganglion block if she      does not improve further with medications.  6. She will get a Psychology referral.  7. Will see her back in one month with our nurse clinician and in two      months with me.      Ranelle Oyster, M.D.  Electronically Signed     ZTS/MedQ  D:  06/22/2006 16:56:36  T:  06/22/2006  20:19:45  Job #:  [id number]   cc:   Jonny Ruiz L. Rendall, M.D.  Fax: [phone]

## 2010-06-21 NOTE — Discharge Summary (Signed)
[name], [name]          ACCOUNT NO.:  [id number]   MEDICAL RECORD NO.:  [id number]          PATIENT TYPE:  INP   LOCATION:  1604                         FACILITY:  [facility]   PHYSICIAN:  John L. Morales, M.D.  DATE OF BIRTH:  April 09, 1960   DATE OF ADMISSION:  12/13/2006  DATE OF DISCHARGE:  12/16/2006                               DISCHARGE SUMMARY   ADMISSION DIAGNOSIS:  Osteoarthritis of left knee, status post  patellofemoral knee prosthesis.   PROCEDURE:  Revision of left knee with removal of patellofemoral  prosthesis and conversion to a total knee arthroplasty with computer  navigation.   HISTORY:  This is a 50 year old white female with a history of  patellofemoral arthroplasty on the left knee by Dr. Lequita Halt in November  2005.  Since surgery, she has had pain in the left knee.  She is in  constant pain,  which is worse with standing, walking, or any activity.  It wakes her at nighttime.  She has failed conservative treatment and  because of this sharp, dull, stabbing, throbbing and aching pain is  indicated for removal of the patellofemoral arthroplasty and change to a  total knee arthroplasty.   PREOPERATIVE DIAGNOSIS:  Failed patellofemoral prosthesis.   DISCHARGE DIAGNOSIS:  1. Failed patellofemoral prosthesis.  2. History of thyroid cancer.  3. History of reflex sympathetic dystrophy.  4. Hypothyroidism.  5. Migraines.  6. Gastroesophageal reflux disease.  7. Exogenous obesity.   HOSPITAL COURSE:  A 50 year old female admitted December 13, 2006, after  appropriate laboratory studies were obtained as well as taken to the  operating room, where she underwent removal of a patellofemoral  prosthesis and revision to a total knee arthroplasty, left knee.  She  tolerated the procedure well.  Placed on a Dilaudid PCA full-dose pump.  Two  grams of Ancef IV was given intraoperatively.  A Foley was also  placed intraoperatively.  Postoperatively placed on Arixtra 2.5  mg  injected daily starting December 14, 2006 at 8:00 a.m.  Consults with PT,  OT were made.  She was allowed to be weightbearing as tolerated.  She  was allowed out of bed to chair the following day.  Chest x-ray was  ordered.  She was started on Chantix for her nicotine use.  The  following day, her Foley was discontinued.  She was placed on oxycodone  15 mg p.o. q.6h. p.r.n. breakthrough pain.  The remainder of her  hospital course was uneventful and she was discharged on the 9th to  return back to the office for followup.   LABORATORY DATA:  Admitted with hemoglobin of 16, hematocrit 47%, white  count 14,400, platelets 312,000.  Discharge hemoglobin 10.8, hematocrit  31.9%, white count 11,100, platelets 277,000.  Preop sodium 141,  potassium 4.1, chloride 104, CO2 27, glucose 111, BUN 7, creatinine  0.77, GFR greater than 60, total protein 6.2, albumin 3.5, AST 25, ALT  28, ALP 102, bilirubin 0.7.  Discharge sodium 140, potassium 4, chloride  107, CO2 29, glucose 142, BUN 3, creatinine 0.61.  Urinalysis revealed a  small amount of hemoglobin, few epithelials, 0 to 2 whites,  rare  bacteria, and hyaline casts and granular casts were noted.  Blood type  A+.  Antibody screen negative.  Gram stain of November 6 revealed  abundant white cells, predominantly  mononuclear.  Urine culture showed  no growth.  Wound and tissue cultures x2 aerobically and anaerobically  revealed no growth.  Radiographic studies of November 3 revealed chest x-  ray with chronic lung markings without evidence of active or acute  disease.  An November 7 chest x-ray showed chronic lung changes, no  acute pulmonary changes.   DISCHARGE INSTRUCTIONS:  No restrictions on diet.  Keep her wound clean  and dry and change dressing daily.  Call for any signs of infection.  She is to use crutches for ambulation, weightbearing as tolerated.  She  is to follow the blue instruction sheet.  CPM 0 to 90 degrees for 6 to 8  hours  per day,  increasing by 10 degrees daily.  [facility] for home  health.   MEDICATIONS:  1. Include Arixtra 2.5 mg 1 injection daily at 8:00 a.m., last dose      December 20, 2006.  2. Opana 40 mg ER 1 tablet q.8h. as needed for pain.  3. Oxycodone 15 mg 1 tablet q.4-6h. p.r.n. breakthrough pain.  4. Robaxin 500 mg 1 tablet every 8 hours as needed for spasms.   Appointment with Dr. Priscille Kluver 1 week after discharge.  She was discharged  in improved condition.      Oris Drone Petrarca, P.A.-C.      [name], M.D.  Electronically Signed    BDP/MEDQ  D:  01/16/2007  T:  01/16/2007  Job:  [id number]

## 2010-06-21 NOTE — Assessment & Plan Note (Signed)
REASON FOR VISIT:  [name] is back regarding her knee and back pain.  She had good results initially with the left SI joint injection, but it  began to wear off and she had bilateral ones done on September 11, which  did not give her as a good of result.  She now has knee surgery  scheduled for November 6, at [facility], with Dr. Priscille Kluver.  Overall, she rates her pain a 4-5/10.  She has been seeing Dr. Wallace Cullens of  psychiatry in [place] and has really been enjoying visits and  feels that it is benefitting her, especially from a counseling  standpoint.  She describes her pain as sharp, burning, tingling,  stabbing and aching.  Pain interferes with general activity,  relationship with others and enjoyment of life on a moderate to severe  level.   REVIEW OF SYSTEMS:  Notable for spasms, numbness, occasional confusion,  depression and anxiety, weight gain, constipation, nausea, limb  swelling.  Other pertinent positives are listed above.   SOCIAL HISTORY:  The patient is married and living with her spouse.  She  still smokes one-half packs of cigarettes per day.   PHYSICAL EXAMINATION:  VITAL SIGNS:  Blood pressure 127/80, pulse 118,  respirations 18, saturations 96% on room air.  GENERAL:  The patient is pleasant, alert and oriented x3.  I told her  mood was brighter today.  NEUROLOGIC:  She is walking with her cane and is antalgic to the left.  Motor and sensory exam is generally intact except for some pain in the  left leg.  She still has some peripatellar swelling on the left knee.  No temperature or color changes are noted on the left leg.  HEART:  Regular rhythm, but tachycardic.  CHEST:  Clear.  ABDOMEN:  Soft, nontender.  Weight is still unchanged.   ASSESSMENT:  1. Chronic low back pain with facet arthropathy and degenerative disc      disease.  She has had some left sacroiliac joint inflammation as      well.  2. Chronic osteoarthritis of the left knee, due now  for left total      knee replacement on December 13, 2006.   PLAN:  1. I will see the patient in the hospital for pain management as      needed.  2. I refilled Opana ER 40 mg q.8 h. and oxycodone 15 mg q.4 h. p.r.n.      today.  3. Continue Tofranil and Lyrica for nerve pain and mood.  Cymbalta is      on board as well for mood and neuropathic pain.  4. Will prescribe Chantix 1 mg x12 weeks for smoking cessation as the      patient seems to be earnest about this as she was recently      diagnosed with COPD by her PCP.  5. Schedule her back with me in 3 months here in the office and she      will see the nurses in approximately 1 month.      Ranelle Oyster, M.D.  Electronically Signed    ZTS/MedQ  D:  11/30/2006 09:59:18  T:  11/30/2006 21:40:13  Job #:  [id number]   cc:   Jonny Ruiz L. Rendall, M.D.  Fax: [phone]

## 2010-06-21 NOTE — Assessment & Plan Note (Signed)
[name] is back regarding her chronic knee pain and back pain.  Her  pain has been generally stable to 5-6/10.  Has not any problems with the  change of MS Contin 100 q.12 h, and in fact today I think, she is doing  a bit better.  She uses her oxycodone for breakthrough pain.  Neurontin  has helped a bit from a generalized pain standpoint.  She describes pain  as sharp, burning, stabbing, dull, tingling, and aching.  Pain  interferes general activity, relations with others, and enjoyment of  life on a moderate-to-severe level.  Sleep is fair.   REVIEW OF SYSTEMS:  Notable for the above.  Does report some depression  and anxiety, as well as numbness, tremor, tingling, weight gain  recently.  Night sweats, limb swelling, poor appetite, constipation.  Full 14-point review is in the written health and history section of the  chart.   SOCIAL HISTORY:  The patient is married, living with her spouse.  She is  still smoking half pack a day, but trying to cut down.   PHYSICAL EXAMINATION:  Blood pressure is 112/56, pulse is 87,  respiratory rate 80.  She is sating 95% on room air.  The patient is  pleasant, alert and oriented x3.  She walks with her cane and still has  some antalgia on the left side.  Mood is generally fair.  She is a bit  more bright than she was last visit.  Cognitively, she is intact.  She  is still displaying some pain inhibition in the left knee and lower  extremity.  There is moderate swelling of the left knee again.  Back is  limited with flexion and extension.  Heart is regular.  Chest is clear.  Abdomen is soft, nontender.   ASSESSMENT:  1. Chronic low back pain with facet arthropathy and degenerative disk      disease.  2. Osteoarthritis of the bilateral knees, left greater than right      status post left knee replacement.  3. Left sacroiliac joint dysfunction.  4. Coccydynia.   PLAN:  1. Continue Neurontin 600 mg t.i.d.  We will stay with MS Contin 100  mg q.12 h #60 and oxycodone 15 mg one q. 6 h p.r.n. #100.      Encourage smoking cessation, weight loss, and increased exercise.  2. We will see her back with nursing in 1 month and me in 3 months.      [name], M.D.  Electronically Signed     ZTS/MedQ  D:  05/11/2008 10:01:05  T:  05/11/2008 22:27:17  Job #:  [id number]   cc:   Jonny Ruiz L. Rendall, M.D.  Fax: [phone]

## 2010-06-21 NOTE — Assessment & Plan Note (Signed)
[name] is back regarding her knee and back pain.  No substantial  changes are noted from last visit in June.  She switched over to [facility]  and does not notice a big change with it specifically.  Her biggest  issue today is that she is temporally out of insurance until her  Medicare-D Program kicks in.  Several of her medications including  Savella, Lyrica, and the Opana cannot be paid for currently.  She wants  to discuss alternatives today.  She rates her pain as a 6 to 7/10.  Describes it as sharp burning, stabbing, aching, and tingling.  The pain  is usually worse with walking and activities as well as stress.   REVIEW OF SYSTEMS:  Notable for bladder control problems, weakness,  numbness, tingling, trouble walking, spasms, confusion, depression,  anxiety, history of suicidal thoughts, waking, vomiting, and nausea.  Full review is in the written health and history section in the chart.   SOCIAL HISTORY:  The patient is married and living with the spouse and  smoking one pack of cigarettes per day.   PHYSICAL EXAMINATION:  VITAL SIGNS:  Blood pressure is 129/70, pulse is  94, and respiratory rate 18.  She is sating 95% on room air.  GENERAL:  The patient is pleasant, alert, and oriented x3.  Mood is  generally appropriate.  She is antalgic on the left side.  She remains  overweight.  MUSCULOSKELETAL:  Left knee is swollen without frank allodynia.  Strength is inhibited at the knee due to pain inhibition.  HEART:  Regular.  CHEST:  Clear.  ABDOMEN:  Soft and nontender.   ASSESSMENT:  1. Chronic low back pain with facet arthropathy and degenerative disk      disease.  2. Questionable left sacroiliac joint dysfunction.  3. Osteoarthritis of the knee status post left total knee replacement      in November 2006.   PLAN:  1. We will switch Lyrica to Neurontin 300 mg t.i.d.  2. We will change Opana ER to MS Contin 60 mg q.8 h. with 50 mg      oxycodone for breakthrough pain  q.6 h. p.r.n.  The current Opana 40      q.8 h. comes out to a greater dose equally analgesically compared      to the MS Contin I wrote today.  However, financial considerations      are being made here.  If she needs adjustment upward, we can do so.      I asked her to call our office if any problems or symptoms of      withdrawal are noted.  3. I gave her samples of Savella for now.  She will take the      equivalent of 50 mg once a day for the time being.  She should have      coverage in the fall.  4. We will see her back in one month with the nurse clinic for      followup and refills and then I will see her in 2 months.      [name], M.D.  Electronically Signed     ZTS/MedQ  D:  09/09/2007 13:18:03  T:  09/10/2007 03:56:27  Job #:  [id number]   cc:   [name], M.D.  Fax: [phone]

## 2010-06-21 NOTE — Assessment & Plan Note (Signed)
Ms. Tove is back regarding her chronic pain, particularly in the  back and knee.  She has not done as well with the change from Opana to  MS Contin.  She was unable to continue with Savella due to lack of  coverage by her insurance, which is Medicare AP plan.  She is also  complaining of coccygeal discomfort as well.  Her pain is a 7/10 today  and described as stabbing, sharp, burning, constant, aching, and  tingling.  Pain interferes with general activity, in relationship with  others, and enjoyment of life on a 9/10 level.  Sleep is fair.  Pain  increases with most activities of exertion and improves with rest, heat,  ice, medications, TENS unit, etc.   REVIEW OF SYSTEMS:  Notable for the above as well as some bladder  control issues, confusion, depression, anxiety, back spasms, and  numbness in her hands.  She also has been losing weight by desire.  She  lost about 25 pounds.  She reports occasional nausea, reflux,  constipation, poor appetite, and limb swelling.  full review is in the  written health and history section of the chart.   SOCIAL HISTORY:  The patient is married, living with her spouse, and her  spouse and she are still smoking 1 pack of cigarette per day, but they  both want to quit.   PHYSICAL EXAMINATION:  VITAL SIGNS:  Blood pressure is 114/78, pulse is  102, respiratory rate 18, and she is sating 95% on room air.  GENERAL:  The patient is pleasant, alert, and oriented x3.  I thought  her mood was generally more upbeat.  She has lost weight.  Left knee is  somewhat swollen without redness or irritation.  Strength is still an  issue at the knee due to some pain inhibition proximally.  BACK:  Somewhat tender with range of motion today.  She had pain with  palpation over the lower lumbar segments as well as the coccygeal region  today, left more than right.  HEART:  Regular.  CHEST:  Clear.  ABDOMEN:  Soft and nontender.   IMPRESSION:  1. Chronic low back  pain with facet arthropathy and degenerative disk      disease.  2. Questionable left sacroiliac joint dysfunction.  3. Osteoarthritis of left knee status post left knee replacement and      chronic associated pain.  4. Coccydynia.   PLAN:  1. Continue with Neurontin.  2. We will change MS Contin back to Opana ER 40 mg every 8 hours.  3. She will use oxycodone 15 mg IR for breakthrough pain 1 every 6      hours p.r.n., #90.  4. Encourage weight loss.  She needs to increase her activity levels      as a whole.  5. I wrote her a prescription for Chantix 1 mg b.i.d.  6. We will begin Celexa 20 mg nightly for mood.  7. We will see her back in about 1 month's time.      Ranelle Oyster, M.D.  Electronically Signed     ZTS/MedQ  D:  11/12/2007 13:14:12  T:  11/13/2007 02:31:03  Job #:  [id number]   cc:   Jonny Ruiz L. Rendall, M.D.  Fax: [phone]

## 2010-06-21 NOTE — Procedures (Signed)
[name], [name] NO.:  [id number]   MEDICAL RECORD NO.:  [id number]           PATIENT TYPE:   LOCATION:                                 FACILITY:   PHYSICIAN:  Erick Colace, M.D.DATE OF BIRTH:  September 07, 1960   DATE OF PROCEDURE:  09/20/2006  DATE OF DISCHARGE:                               OPERATIVE REPORT   PROCEDURE:  This is a left sacroiliac joint injection under fluoroscopic  guidance.   INDICATION:  Sacroiliac pain only partially relieved with medication  management pain interferes with mobility and ADLs.   ALLERGIES:  NSAIDs.   PROCEDURE IN DETAIL:  Informed consent was obtained after describing  risks and benefits of the procedure to the patient.  These include  bleeding, bruising, infection, loss of bowel or bladder function,  temporary or permanent paralysis, and she elected to proceed and has  given written consent.  The patient was placed prone on fluoroscopy  table.  Betadine prep, sterile drape, 25 gauge inch and a half needle  was used to anesthetize skin and subcu tissue 1% lidocaine x2 mL.  A 25  gauge 3 inch spinal needle was inserted in the left SI joint under  fluoroscopic guidance.  AP lateral imaging utilized.  Omnipaque 180 x  0.5 mL demonstrated no intravascular uptake and solution containing 0.5  mL of 40 mg mL Depo-Medrol and 1 mL of 2% MPF lidocaine were injected.  The patient tolerated the procedure well.  Post injection instructions  given.      Erick Colace, M.D.  Electronically Signed     AEK/MEDQ  D:  12/06/2006 17:21:08  T:  12/07/2006 11:44:40  Job:  [id number]

## 2010-06-21 NOTE — Op Note (Signed)
[name], [name]          ACCOUNT NO.:  [id number]   MEDICAL RECORD NO.:  [id number]          PATIENT TYPE:  INP   LOCATION:  1604                         FACILITY:  [facility]   PHYSICIAN:  John L. Rendall, M.D.  DATE OF BIRTH:  October 01, 1960   DATE OF PROCEDURE:  DATE OF DISCHARGE:                               OPERATIVE REPORT   PREOPERATIVE DIAGNOSIS:  Painful patellofemoral left knee prosthesis.   SURGICAL PROCEDURE:  Removal of patellofemoral prosthesis and insertion  of full total knee arthroplasty with computer navigation assistance.   POSTOPERATIVE DIAGNOSIS:  Painful patellofemoral left knee prosthesis.   SURGEON:  John L. Rendall, M.D.   ASSISTANT:  Richardean Canal, P.A.-C.   ANESTHESIA:  General with femoral nerve block.   FINDINGS:  Some bony overgrowth and reaction about the femoral portion  of the patellofemoral prosthesis with chronic synovitis  there plus a 6-  cm vertical midline cyst down the center of the tibia.   PROCEDURE:  Under general anesthesia with femoral nerve block, the left  leg was prepared with DuraPrep and draped as a sterile field.  The  previous midline surgical scar was excised.  The patella was everted  with the medial parapatellar extension.  The patellar component was  dislodged and base plate left in place.  The knee was then debrided in  preparation for computer navigation.  Schanz pins were placed in the  superomedial tibia through stab wounds and in the distal medial femur.  After appropriate debridement, the femoral head was then located using  the computer.  The medial and lateral malleoli were identified.  The  proximal tibia and distal femur were then mapped.  At this point,  decision was made to remove the patellar baseplate, as the total  thickness of the patellar component was approximately 15 mm of bone.  It  was removed, taking it down to 13 mm of bone and then freshened up,  removing another 1.5 mm so it was right at 11 mm  thick.  It was then  redrilled for three pegs.  The femoral component was then removed by  undermining it around the periphery with 1/2-inch osteotome, and then  undermining it superiorly, it was able to be popped loose.  At this  point, it turned into a standard knee replacement.  It should be noted  that the joint fluid appeared normal.  There did not appear to be any  infection, but still, gram stain and C&S were sent.  The joint fluid  came back as only a few white blood cells, mixture of polys and lymphs.  Following this, the proximal tibia was resected at a level of about 8  mm.  Balancing of the ligaments was then done within 1-1/2 degrees of  anatomical alignment.  The distal femur was then trimmed, taking the  anterior and posterior flare first for a size standard femoral  component. The distal femoral cut was then made.  It was found with the  blocks that 15-mm flexion and extension gaps were perfect.  At this  point, the lamina spreader was inserted, and it was noted that the  center of the tibia was hollow, and immediately you could place more  than your little finger down it right down the middle to a level of  about 6 cm.  The remaining peripheral bone, however, looked good.  Culture and sensitivity of fluid in this cyst was sent.  At this point,  the recessing guide was used on the femur after completing meniscal and  cruciate ligament debridement.  The proximal tibia was sized to a #3 due  to this large cyst cavity. A trial of an MBT revision, size 3, was used  along with a 15 bearing and a standard femur.  This gave excellent fit,  alignment, and stability.  The permanent components were then obtained.  The bony surfaces were prepared with pulse irrigation.  All components  were then cemented in place and excess cement removed.  The tourniquet  was let down at 1 hour and 35 minutes.  Several small vessels were  cauterized.  Minor resection of debris was carried out.  The  knee was  then closed in layers with #1 Tycron, #1 Vicryl, 2-0 Vicryl and  subcutaneous suture with Steri-Strips.   The patient returned to recovery in good condition.      John L. Rendall, M.D.  Electronically Signed     JLR/MEDQ  D:  12/13/2006  T:  12/13/2006  Job:  [id number]

## 2010-06-21 NOTE — Assessment & Plan Note (Signed)
[name] is back regarding her chronic knee pain and back pain.  Knee  has continued to be a problem, particularly in the left side.  She rates  her pain at 5 to 6/10.  She has frequent swelling with activity.  Pain  is sharp, burning, stabbing, tingling, and aching.  Pain interferes with  general activity, relations with others, and enjoyment of life on a  moderate-to-severe level.  Sleep is poor.  She continues to have  depression and anxiety.  She does not do much around the house at this  point.  She feels that she is limited by pain.  She remains on oxycodone  15 mg 1 q.6 h. p.r.n. and has been on MS Contin 60 mg q.8 h. for pain  control as she could no longer afford the Opana due to her donut hole.   REVIEW OF SYSTEMS:  Notable for tingling, spasms, trouble walking,  nausea, occasional constipation, fluctuating weight, and night sweats.  Other pertinent positives are above and full review is in the written  health and history section of the chart.   SOCIAL HISTORY:  The patient is married, living with her spouse.  She is  still smoking a half pack of cigarettes per day.   PHYSICAL EXAMINATION:  VITAL SIGNS:  Blood pressure is 113/80, pulse is  103, and respiratory rate 18.  She is sating 95% on room air.  GENERAL:  The patient is generally pleasant, alert, and oriented x3.  She walks with a cane.  She is antalgic to the left.  Mood is fair,  although generally flat and remains somewhat defeatist in attitude.  Cognition is appropriate.  Alert and oriented.  Strength was decreased  due to pain inhibition in the left knee more than right.  She has  moderate swelling in the left leg today.  Back is stable, no specific  changes noted there.  She has some pain in the lower lumbar regions.  HEART:  Regular.  CHEST:  Clear.  ABDOMEN:  Soft and nontender.   ASSESSMENT:  1. Chronic low back pain with facet arthropathy and degenerative disk      disease.  2. Osteoarthritis of bilateral  knees, left affected more than right      status post left knee replacement.  3. Questionable left sacroiliac joint dysfunction.  4. Coccydynia.   PLAN:  1. We will increase Neurontin ultimately up to 600 mg t.i.d.  2. Change MS Contin to 100 mg q.12 h.  Consider further titration.  I      felt, however, that going from 60 q.8 to 100 q.8 was too big a jump      today.  3. We will give her few extra oxycodone 15 mg for breakthrough pain,      #100.  4. Continue weight control.  5. We will increase Celexa to 40 mg at bedtime.  6. I will see her back in 3 months with nurse clinic followup in 1      month's time.      Ranelle Oyster, M.D.  Electronically Signed     ZTS/MedQ  D:  02/12/2008 10:43:12  T:  02/13/2008 02:02:32  Job #:  [id number]   cc:   Jonny Ruiz L. Rendall, M.D.  Fax: [phone]

## 2010-06-21 NOTE — Assessment & Plan Note (Signed)
[name] is back regarding her back and knee pain.  The left knee has  been about stable.  She states that her left low back is bothering her  more than anything now.  She remains on Opana 40 mg q.8 hours and  oxycodone 15 mg for breakthrough pain q.4-6 hours p.r.n.  The patient  describes the pain as sharp, burning, stabbing, tingling, aching,  constant.  Pain interferes with general activity, relations with others  and enjoyment of life on a severe level.  Sleep is fair.  Lyrica has not  made much of a difference for her in her generalized pain.  The Tofranil  has helped her sleep a bit more.   REVIEW OF SYSTEMS:  The patient does report depression, anxiety, and  some suicidal thoughts, although she has no intentions of following  through with this.  She reports some tingling, bladder control problems,  weight gain, nausea, vomiting and limb swelling.  Other pertinent  positives as listed above.   SOCIAL HISTORY:  Is without significant change today.  Her husband is  supportive and she still smokes 2 packs of cigarettes per day.   PHYSICAL EXAMINATION:  Blood pressure is 110/86, pulse is 99,  respiratory rate 18.  She is sating 97% on room air.  The patient is pleasant, alert and oriented x3.  Affect is generally appropriate. She is a bit flat at times.  She walks with a limp on her left side.  She continues to have crepitus  and painful range of motion left knee, although may be a bit improved.  Knee is slightly swollen.  No warmth or erythema is appreciated.  No  knee instability is appreciated.  Low back is painful with flexion and  extension today.  Left PSIS area is painful with deep palpation.  Patrick's test is positive as is compression of the left pelvis.  Strength is generally a 4+ to 5 out of 5 with pain inhibition on the  left.  HEART:  Regular.  CHEST:  Clear.  ABDOMEN:  Soft, nontender.  The patient remains overweight.   ASSESSMENT:  1. Chronic osteoarthritis of  the left knee status post prior surgery      with pending left total knee replacement.  2. Chronic low back pain with facet arthropathy and degenerative disc      disease.  The patient's pain may perhaps be left sacroiliac joints      inflammation.   PLAN:  1. Set patient up for a left SI joint injection with Dr. Wynn Banker.  2. Will increase Lyrica to 150 mg b.i.d. then to t.i.d. after 1 week's      time.  3. Increase Tofranil to 150 mg nightly.  4. To use Cymbalta for neuropathic pain.  5. Will make referral to psychology in the [facility] area for pain      management and coping skills.  6. Patient is clear from my standpoint to have surgery in her left      knee.  I am not sure if there is any need to wait      further here.  Pain in the left knee is definitely related to      weightbearing and activity levels.  7. I will see her back pending SI joint injection.      Ranelle Oyster, M.D.  Electronically Signed     ZTS/MedQ  D:  08/29/2006 09:54:57  T:  08/29/2006 14:28:49  Job #:  [id number]   cc:  John L. Rendall, M.D.  Fax: [phone]

## 2010-06-21 NOTE — Assessment & Plan Note (Signed)
[name] is back regarding her chronic low back and left knee pain.  Not a lot of change since I last saw her.  She has a good and bad days.  She has tried to increase her activity somewhat.  She has been engaged  with birth of her new grandchild and spent few weeks with her daughter  helping care for the baby.  She has not had any new contact with  orthopedic surgery regarding her left knee.  Her pain seems to be in the  6-7/10 range and she described as sharp, burning, dull, stabbing,  constant tingling, and aching.  Pain interferes with general activity,  relations with others, enjoyment of life on a 9/10 level.  Sleep is  still poor and reasons for this are multifactorial due to restlessness,  pain, and interruptions at night.  Pain increases with walking, bending,  standing, and some activities and improves with heat, sometimes ice to  her knees, therapy, her medications, and relative rest.   REVIEW OF SYSTEMS:  Notable for multiple issues including bladder  control issues, numbness, tremor, tingling, spasms, depression,  confusion, anxiety, weight gain, night sweats, skin rash, nausea,  vomiting, constipation, poor appetite, and limb swelling.  She does  report of suicidal thoughts in the past, but not actively.  Full review  is in the written health and history section of the chart.   SOCIAL HISTORY:  Noted above.  She is married and living with her  spouse.  She continues to smoke half pack of cigarettes per day.   PHYSICAL EXAMINATION:  VITAL SIGNS:  Blood pressure is 98/59, pulse is  72, respiratory rate 18.  She is sating 95% on room air.  EXTREMITIES:  The patient walks with significant antalgia on the left.  She had some stiffness in the hamstrings particularly today and has some  problems with full extension as well as knee flexion past 90 degrees on  the left side.  Significant crepitus was appreciated with range today  and she had pain with palpation over the anterior  aspect of the knee.  A  moderate swelling is seen around the knee and slight discoloration of  the skin is seen as well, but nothing dramatic.  There is no frank  allodynia, warmth, or other acute changes noted.  Mood is generally  appropriate.  She is a bit flat and at times become somewhat tearful,  but seems to be functional from a mood and cognitive standpoint at this  point.  BACK:  Remains limited more flexion than extension on exam today.  HEART:  Regular.  CHEST:  Clear.  ABDOMEN:  Soft, nontender.  She remains overweight.   ASSESSMENT:  1. Chronic low back pain with facet arthropathy and degenerative disk      disease.  2. Osteoarthritis of bilateral knees left greater than right, status      post left knee replacement.  Left knee continues to cause chronic      pain.  3. Left sacroiliac joint dysfunction.  4. Constant coccydynia.   PLAN:  1. We will not change Neurontin 600 mg t.i.d.  I did refill this      today.  2. Continue MS Contin 100 mg q.12 h #16.  3. Oxycodone 15 mg 1 q. 6 hours p.r.n. #100.  4. Continue work on weight loss, appropriate diet, and smoking      cessation.  5. We will set the patient up with a TENS unit for her left knee  to      see if she feels any benefit with this.  6. Voltaren gel p.r.n. to the left knee as well.  7. I will see her back in 3 months with 11-month Nursing Clinic visit.      Ranelle Oyster, M.D.  Electronically Signed     ZTS/MedQ  D:  08/04/2008 13:00:39  T:  08/05/2008 04:35:54  Job #:  [id number]   cc:   Bard Herbert

## 2010-06-21 NOTE — Assessment & Plan Note (Signed)
[name] is back regarding her left knee.  She had her total knee  replacement on November 6.  She feels that her leg is moving much better  after therapy, etc.  She is still having some pain over the patella and  infrapatellar regions, however.  She describes it as aching and tingling  at times.  She does note swelling with it intermittently, but no  temperature or color changes.  The area can be sensitive.  She uses  Opana ER for baseline pain control with oxycodone for breakthrough.  She  is also on Cymbalta, Tofranil and Lyrica.  She has had problems getting  these filled due to financial constraints.  She really has not had  Lyrica since November.   REVIEW OF SYSTEMS:  Notable for trouble walking, spasms, tingling,  depression, confusion, anxiety, suicidal thoughts, weight gain with more  recent weight loss, vomiting, nausea, abdominal pain, limb swelling.  Nausea particularly has been a problem.   SOCIAL HISTORY:  Patient is married, living with her spouse.  She smokes  1-1/2 packs of cigarettes per day.   PHYSICAL EXAMINATION:  Blood pressure 123/66, pulse 96, respiratory rate  20.  She is satting 96% on room air.  Patient is pleasant, alert and  oriented x3.  She walks with less antalgia than I have seen in some time  on the left leg.  The left leg is still tender with range of motion.  The leg was slightly reddish in coloration.  No frank allodynia was  appreciated today.  Some edema was noted in the infrapatellar region.  No temperature change was appreciated.  HEART:  Regular.  CHEST:  Clear.  ABDOMEN:  Soft, nontender.   ASSESSMENT:  1. Chronic low back pain with  facet arthropathy and degenerative disk      disease.  2. Questionable left SI joint involvement.  3. Chronic osteoarthritis of the knee status post left total knee      replacement November 6.  Patient still with some symptoms of pain      surrounding the left knee, which remain a problem.   PLAN:  1. I  wonder if the patient being off Lyrica for the last two to three      months has played a role in increasing pain in the left knee.  I      would like her to resume that medication.  2. Continue Tofranil and Cymbalta as well.  3. We will change her oxycodone to Opana immediate release, 10 mg 1      q.6 hours p.r.n. #120 to replace the oxycodone.  4. Patient needs to take her smoking cessation seriously for both      health and monetary reasons.  5. I will see her back in about three months here in Pain Clinic with      nurse followup in two months.      Ranelle Oyster, M.D.  Electronically Signed     ZTS/MedQ  D:  03/18/2007 16:39:09  T:  03/19/2007 15:17:36  Job #:  [id number]   cc:   Bard Herbert, M.D.

## 2010-06-24 NOTE — H&P (Signed)
NAMEQUETZALLI, CLOS NO.:  [id number]   MEDICAL RECORD NO.:  [id number]          PATIENT TYPE:  INP   LOCATION:  0452                         FACILITY:  [facility]   PHYSICIAN:  Ollen Gross, M.D.    DATE OF BIRTH:  06-Nov-1960   DATE OF ADMISSION:  12/25/2003  DATE OF DISCHARGE:                                HISTORY & PHYSICAL   Date of office visit, history and physical:  December 22, 2003.   CHIEF COMPLAINT:  Left knee pain.   HISTORY OF PRESENT ILLNESS:  The patient is a 50 year old female who has  been seen by Dr. Lequita Halt for ongoing left knee pain.  She has undergone  workup and had an MRI, which did show normal medial and lateral compartments  of the knee but she had significant degeneration of the patellofemoral  compartment.  She also had some subchondral cyst formation of the patella.  It looked like the patella was centered nicely, but the degeneration has  persisted and worsened.  This has been an ongoing problem for her.  She has  moderate pain with ambulation especially in and around the kneecap.  Her  symptoms have been quite progressive in nature, centered around the  patellofemoral compartment.  Due to the lack of improvement with  conservative measures, it is felt the next course would be to pursue a  patellofemoral replacement.  This is discussed at length with her, went over  everything in detail and the patient elected to proceed with a  patellofemoral replacement.   ALLERGIES:  NORVASC causes swelling of the hands and feet.  INFLAMMATORIES,  NSAIDs cause swelling of hands, feet, neck, and face with some choking.  IBUPROFEN, swelling of the face, neck, hands, and feet, difficulty  breathing.   Intolerances:  Aspirin causes stomach bleeding.  Imitrex lowers blood  pressure.   PAST MEDICAL HISTORY:  1.  Migraines.  2.  Depression.  3.  History of bronchitis.  4.  History of pneumonia.  5.  Hemorrhoids.  6.  History of urinary tract  infection.  7.  Urinary incontinence.  8.  Uterine fibroid disease.   PAST SURGICAL HISTORY:  1.  Tonsillectomy.  2.  Right elbow ulnar nerve surgery.  3.  Left elbow ulnar nerve surgery.  4.  Bilateral carpal tunnel procedures.  5.  Tubal ligation.  6.  Exploratory laparotomy with removal of scar tissue from tubal ligation.  7.  Hemorrhoid surgery.  8.  Left knee arthroscopic procedure.  9.  Abdominal hysterectomy with right oophorectomy.  10. Right knee arthroscopic knee surgery and a second left knee arthroscopic      knee surgery.   SOCIAL HISTORY:  Married.  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  Smoker, less than one pack per day.  No alcohol.  Has two  children.  Her husband and friend will be assisting with care after surgery.   FAMILY HISTORY:  Father, age 28, with a history of hypertension, heart  disease, diabetes, and also hemophilia.  Mother, age 32, with a history of  hypertension and arthritis.  She  does have grandparents with a history of  heart disease, diabetes, stroke, arthritis, and Alzheimer's.   REVIEW OF SYSTEMS:  GENERAL:  No fevers, chills, night sweats.  NEUROLOGIC:  Occasional blurred vision.  No seizure, syncope, or paralysis.  RESPIRATORY:  No shortness of breath, productive cough, or hemoptysis.  CARDIOVASCULAR:  No chest pain, angina, or orthopnea.  GASTROINTESTINAL:  She does have some  hemorrhoids and has had some previous blood but no recent blood or mucus in  the stool. No nausea or vomiting.  GENITOURINARY:  She does have a little  bit of frequency and some nocturia, occasional urgency.  She does have some  urinary incontinence.  No dysuria or hematuria.  MUSCULOSKELETAL:  Pertinent  to that of the knee, found in the history of present illness.   PHYSICAL EXAMINATION:  VITAL SIGNS:  Pulse 80, respirations 16, blood  pressure 116/80.  GENERAL:  A 50 year old white female, well-nourished, well-developed, large   frame.  Slightly overweight.  Alert and oriented and cooperative.  HEENT:  Normocephalic, atraumatic.  Pupils are round and reactive.  EOMs  intact.  NECK:  Supple.  CHEST:  Clear anterior, posterior.  No rhonchi, rubs, or wheezing.  CARDIAC:  Regular rate and rhythm, no murmurs.  ABDOMEN:  Soft, slightly round abdomen.  Bowel sounds are present.  Nontender.  RECTAL, BREASTS, GENITALIA:  Not done, not pertinent to present illness.  EXTREMITIES:  Left knee shows range of motion of 0-120 degrees, normal  tracking in the patella, trochlear groove, marked crepitus is noted.   IMPRESSION:  1.  Left patellofemoral osteoarthritis.  2.  Migraines.  3.  Depression.  4.  History of bronchitis.  5.  History of pneumonia.  6.  Hemorrhoids.  7.  Past history of urinary tract infection.  8.  Urinary incontinence.  9.  History of uterine fibroid disease leading to hysterectomy.   PLAN:  The patient will be admitted to [facility] to undergo left  knee patellofemoral replacement arthroplasty.  Surgery will be performed by  Ollen Gross, M.D.     Alex   ALP/MEDQ  D:  12/27/2003  T:  12/28/2003  Job:  [id number]

## 2010-07-07 ENCOUNTER — Encounter: Payer: Medicare Other | Admitting: Neurosurgery

## 2010-07-07 DIAGNOSIS — M545 Low back pain, unspecified: Secondary | ICD-10-CM

## 2010-07-07 DIAGNOSIS — M25559 Pain in unspecified hip: Secondary | ICD-10-CM

## 2010-07-08 NOTE — Assessment & Plan Note (Signed)
SUBJECTIVE:  [name] returns today as followup for chronic leg pain with some knee and ankle pain.  She states that nothing really new has been ongoing.  She has also had history of migraines.  She was started on propranolol by Dr. Riley Kill last appointment.  She is up to three times a day now.  She states that her migraines decreased in length as well as intensity.  She rates her pain now as average 6-7, sharp anywhere from dull, stabbing, and constant.  All activities aggravate.  Heat, rest, ice therapy, and medication tend to help.  She walks with a cane.  She does have a brace, it is stabilized and has brace on her left knee where she has had a knee replacement.  REVIEW OF SYSTEMS:  Notable for those difficulties described above as well as weight loss, nausea, vomiting, constipation, poor appetite, limb swelling, as well as some bladder control issues, paresthesias, trouble walking, dizziness, depression, anxiety.  She has had suicidal thoughts. No plans.  No signs of aberrant behavior.  PAST MEDICAL HISTORY:  Unchanged.  SOCIAL HISTORY:  She is married and lives with her spouse.  FAMILY HISTORY:  Unchanged.  PHYSICAL EXAMINATION:  Blood pressure 120/67, pulse 86, respiration 18, O2 sats 95% on room air.  She has weak motor strength in the left lower extremity with forward extension, but her gait is somewhat improved with the use of brace and the cane.  She still walks with a limp. Constitutionally, she is slightly obese and she is alert and oriented x3.  Her affect is bright.  She is very pleasant person.  ASSESSMENT: 1. Chronic low back pain with lumbar spondylosis and facet     arthropathy. 2. Osteoarthritis of the knees status post left knee replacement. 3. Migraine headaches.  PLAN: 1. She will continue her propranolol as prescribed three times a day. 2. She will continue her Neurontin. 3. We went ahead and refilled her MS Contin 100 mg q.12, 60 and     oxycodone  15 mg q.6, 120. 4. She will follow up back here in a month for nurse visit and another     month with Dr. Riley Kill.  Questions were encouraged and answered.     [name] Electronically Signed    RLW/MedQ D:  07/07/2010 12:01:51  T:  07/08/2010 02:14:35  Job #:  [id number]

## 2010-08-04 ENCOUNTER — Encounter: Payer: Medicare Other | Attending: Physical Medicine & Rehabilitation | Admitting: Neurosurgery

## 2010-08-04 DIAGNOSIS — M47817 Spondylosis without myelopathy or radiculopathy, lumbosacral region: Secondary | ICD-10-CM

## 2010-08-04 DIAGNOSIS — M1710 Unilateral primary osteoarthritis, unspecified knee: Secondary | ICD-10-CM | POA: Insufficient documentation

## 2010-08-04 DIAGNOSIS — G43909 Migraine, unspecified, not intractable, without status migrainosus: Secondary | ICD-10-CM | POA: Insufficient documentation

## 2010-08-04 DIAGNOSIS — Z79899 Other long term (current) drug therapy: Secondary | ICD-10-CM | POA: Insufficient documentation

## 2010-08-04 DIAGNOSIS — F341 Dysthymic disorder: Secondary | ICD-10-CM | POA: Insufficient documentation

## 2010-08-04 DIAGNOSIS — M79609 Pain in unspecified limb: Secondary | ICD-10-CM | POA: Insufficient documentation

## 2010-08-04 DIAGNOSIS — M545 Low back pain, unspecified: Secondary | ICD-10-CM | POA: Insufficient documentation

## 2010-08-05 NOTE — Assessment & Plan Note (Signed)
[name] follows up today for her chronic leg pain with some left knee and ankle pain.  She states that this is unchanged.  She does have a hinged brace on today that tends to help, but she has problems with migraines and feels like she has one coming on today.  She rates her pain is at a 6-9, sharp burning, stabbing-type pain that is constant. General activity level is 9.  Sleep patterns are poor.  Walking, bending, standing, and most activities aggravate.  Heat, rest, therapy, TENS, and injections help with medication.  She uses a cane to ambulate. She does drive and climb steps.  She can walk about 10 minutes at a time.  REVIEW OF SYSTEMS:  Notable for those difficulties described above as well as some weight loss and gain fluctuations, night sweats, nausea, vomiting, constipation, poor appetite, and limb swelling.  She has some bladder control issues as well as paresthesias, depression, anxiety, and suicidal thoughts, though she has no plans and no signs of aberrant behavior.  PAST MEDICAL HISTORY:  Unchanged.  SOCIAL HISTORY:  She is married, lives with her spouse.  FAMILY HISTORY:  Unchanged.  PHYSICAL EXAMINATION:  Blood pressure 122/68, pulse 86, respirations 12, and O2 sats 96 on room air.  Her motor strength is weak in the left lower extremity, about 3/5 in the iliopsoas, 5/5 on the right to resistance testing.  Her sensation is intact.  She does have a migraine today, so she is photosensitive light.  Constitutionally, she is obese. She is alert and oriented x3.  Her affect is bright.  ASSESSMENT: 1. Chronic low back pain with spondylosis and facet arthropathy. 2. Arthritis of the knees, left knee replacement history. 3. Migraine headaches.  PLAN: 1. She will continue her current medication regimen. 2. Refilled her oxycodone 15 mg 1 p.o. q.6 h., #120 with no refill and     MS Contin 100 mg 1 p.o. 12 hours, #60 with no refill.  We will see     her back in 1 month.   Her questions were encouraged and answered.     [name] L. [name] Electronically Signed    RLW/MedQ D:  08/04/2010 11:33:32  T:  08/05/2010 01:09:58  Job #:  [id number]

## 2010-09-01 ENCOUNTER — Encounter: Payer: Medicare Other | Admitting: Neurosurgery

## 2010-09-05 ENCOUNTER — Encounter: Payer: Medicare Other | Attending: Neurosurgery | Admitting: Neurosurgery

## 2010-09-05 DIAGNOSIS — M545 Low back pain, unspecified: Secondary | ICD-10-CM | POA: Insufficient documentation

## 2010-09-05 DIAGNOSIS — M1710 Unilateral primary osteoarthritis, unspecified knee: Secondary | ICD-10-CM | POA: Insufficient documentation

## 2010-09-05 DIAGNOSIS — M533 Sacrococcygeal disorders, not elsewhere classified: Secondary | ICD-10-CM | POA: Insufficient documentation

## 2010-09-05 DIAGNOSIS — G8929 Other chronic pain: Secondary | ICD-10-CM | POA: Insufficient documentation

## 2010-09-05 DIAGNOSIS — IMO0002 Reserved for concepts with insufficient information to code with codable children: Secondary | ICD-10-CM | POA: Insufficient documentation

## 2010-09-05 DIAGNOSIS — M129 Arthropathy, unspecified: Secondary | ICD-10-CM | POA: Insufficient documentation

## 2010-09-06 NOTE — Assessment & Plan Note (Signed)
[name] is a patient of Dr. Rosalyn Charters, seen for chronic low back pain and chronic bilateral knee osteoarthritis.  She reports no change today even though she does have some increased pain in her knees from time to time.  She states it is pretty much the same as always, it is a constant pain, sharp, burning, stabbing with paresthesias.  Average pain is a 7 to an 8.  Activity level is a 9.  Sleep patterns are poor.  Most all activities aggravate it; rest, heat, TENS unit, medication tend to help.  She does get injections from time to time.  She walks with a cane.  She can climb steps and drive.  She can only walk 5-10 minutes at a time.  She is on disability.  REVIEW OF SYSTEMS:  Notable for those difficulties described above as well as tremors and tingling, confusion, depression, anxiety.  Had thoughts of suicide with no plan.  She has no aberrant behaviors.  Her Oswestry score is 74.  She has also had some problems with weight fluctuations, night sweats, GI upset, poor appetite, and lower extremity limb swelling.  PAST MEDICAL HISTORY:  Unchanged.  SOCIAL HISTORY:  She is married.  She lives with her husband.  FAMILY HISTORY:  Unchanged.  PHYSICAL EXAMINATION:  VITAL SIGNS:  Her blood pressure is 115/31, second pressure was 97/63, pulse 97, respirations 18, O2 sats 97 on room air. EXTREMITIES:  She does give way to pain in the lower extremities.  Her reflexes are somewhat diminished about 1 at the quadriceps.  Her sensation is intact, diminished in the lower extremities. CONSTITUTIONAL:  She is within normal limits.  She is alert and oriented x3.  Her affect is bright today.  She does ambulate again with a cane and she has a significant limp.  She has a brace on her lower extremity.  ASSESSMENT: 1. Chronic low back pain, facet arthropathy. 2. Degenerative disk disease. 3. Osteoarthritis of knees bilaterally. 4. Left sacroiliac joint dysfunction. 5. Calcodynia.  PLAN: 1.  Refill her oxycodone 15 mg 1 p.o. q.6 h. p.r.n., 120 with no     refill. 2. MS Contin 100 mg 1 p.o. q.12, 60 with no refill. 3. She will follow up with Dr. Riley Kill in 1 month.  She states she has     appointment already scheduled.  Her questions were encouraged and     answered.     Derrel Moore L. Blima Dessert Electronically Signed    RLW/MedQ D:  09/05/2010 16:04:14  T:  09/06/2010 02:14:53  Job #:  [id number]

## 2010-10-03 ENCOUNTER — Encounter: Payer: Medicare Other | Attending: Neurosurgery | Admitting: Neurosurgery

## 2010-10-03 DIAGNOSIS — M25569 Pain in unspecified knee: Secondary | ICD-10-CM

## 2010-10-03 DIAGNOSIS — M545 Low back pain, unspecified: Secondary | ICD-10-CM

## 2010-10-03 DIAGNOSIS — M1710 Unilateral primary osteoarthritis, unspecified knee: Secondary | ICD-10-CM

## 2010-10-03 DIAGNOSIS — M51379 Other intervertebral disc degeneration, lumbosacral region without mention of lumbar back pain or lower extremity pain: Secondary | ICD-10-CM | POA: Insufficient documentation

## 2010-10-03 DIAGNOSIS — M5137 Other intervertebral disc degeneration, lumbosacral region: Secondary | ICD-10-CM | POA: Insufficient documentation

## 2010-10-03 DIAGNOSIS — M129 Arthropathy, unspecified: Secondary | ICD-10-CM | POA: Insufficient documentation

## 2010-10-03 DIAGNOSIS — G8929 Other chronic pain: Secondary | ICD-10-CM | POA: Insufficient documentation

## 2010-10-03 NOTE — Assessment & Plan Note (Signed)
This is a patient of Dr. Riley Kill, seen for chronic low back pain, bilateral knee osteoarthritis, and she reports that she has been having some interscapular spasm-type pain, but otherwise no changes.  She reports her pain is 6-7.  This is a sharp burning with constant pain that is tingling, aching, dull, stabbing sensation.  General activity level of 9.  Sleep patterns are poor.  Walking, bending, standing, and most activity aggravates.  Heat therapy, medication, and some injections help.  She uses a cane for ambulation.  She does climb steps and drive. She can only walk about 5 minutes at a time.  She is on disability.  REVIEW OF SYSTEMS:  Notable for those difficulties described above as well as some weight gain, night sweats, nausea, vomiting, constipation, abdominal pain, poor appetite, limb swelling, coughing, paresthesias, trouble with ambulation, confusion, depression, anxiety, panic attacks. She does have suicidal thoughts, no plan.  Her Oswestry score is 74.  No apparent aberrant behaviors.  PAST MEDICAL HISTORY:  Unchanged.  SOCIAL HISTORY:  She is married.  FAMILY HISTORY:  Unchanged.  PHYSICAL EXAMINATION:  VITAL SIGNS:  Blood pressure 127/81, pulse 87, respirations 18, O2 sats 96 on room air. NEUROLOGIC:  Motor strength is challenged in lower extremities.  She has a brace on her left lower extremity.  She has had a history of knee replacement there.  Sensation is somewhat diminished throughout the upper and lower extremities.  Constitutionally, she is obese.  She is alert and oriented x3.  She has her left brace on and cane available today.  ASSESSMENT: 1. Chronic low back pain. 2. Facet arthropathy. 3. Degenerative disk disease. 4. Osteoarthritis of the knees.  PLAN: 1. Refill her oxycodone 15 mg 1 p.o. q.6 h p.r.n., 120 with no refill. 2. MS Contin 100 mg 1 p.o. q.12 h, 60 with no refill.  She was     counseled on both medicine.  She is about 2 days short on  both as     far as her pill counts.  She will follow in the clinic in 1 month.     Her questions were encouraged and answered. 3. For her spasms in her interscapular area, we are going to start her     on Flexeril 5 mg 1 p.o. t.i.d. p.r.n., 160 with 1 refill.     [name] L. Blima Dessert Electronically Signed    RLW/MedQ D:  10/03/2010 15:05:47  T:  10/03/2010 23:12:28  Job #:  [id number]

## 2010-10-31 ENCOUNTER — Encounter: Payer: Medicare Other | Admitting: Physical Medicine & Rehabilitation

## 2010-11-03 ENCOUNTER — Encounter: Payer: Medicare Other | Attending: Neurosurgery | Admitting: Neurosurgery

## 2010-11-03 ENCOUNTER — Ambulatory Visit: Payer: Medicare Other | Admitting: Neurosurgery

## 2010-11-04 ENCOUNTER — Ambulatory Visit: Payer: Medicare Other | Admitting: Neurosurgery

## 2010-11-15 LAB — CBC
HCT: 31.9 — ABNORMAL LOW
HCT: 33.5 — ABNORMAL LOW
HCT: 43.6
HCT: 47 — ABNORMAL HIGH
Hemoglobin: 10.8 — ABNORMAL LOW
Hemoglobin: 11.5 — ABNORMAL LOW
Hemoglobin: 14.9
Hemoglobin: 16 — ABNORMAL HIGH
MCHC: 33.9
MCHC: 34.1
MCHC: 34.2
MCV: 89.9
MCV: 89.9
MCV: 90.4
MCV: 90.4
Platelets: 242
Platelets: 277
Platelets: 312
Platelets: 312
RBC: 3.55 — ABNORMAL LOW
RBC: 3.73 — ABNORMAL LOW
RBC: 4.06
RBC: 4.83
RBC: 5.2 — ABNORMAL HIGH
RDW: 12.5
RDW: 13.5
RDW: 13.7
WBC: 11.1 — ABNORMAL HIGH
WBC: 14.4 — ABNORMAL HIGH
WBC: 7.6
WBC: 9.1
WBC: 9.8

## 2010-11-15 LAB — BASIC METABOLIC PANEL WITH GFR
BUN: 4 — ABNORMAL LOW
CO2: 27
Calcium: 7.9 — ABNORMAL LOW
Chloride: 105
Creatinine, Ser: 0.68
GFR calc non Af Amer: >60
Glucose, Bld: 116 — ABNORMAL HIGH
Potassium: 4.3
Sodium: 139

## 2010-11-15 LAB — BASIC METABOLIC PANEL
CO2: 29
CO2: 30
Chloride: 104
Chloride: 107
Creatinine, Ser: 0.61
Creatinine, Ser: 0.66
GFR calc Af Amer: >60
GFR calc Af Amer: >60
Glucose, Bld: 142 — ABNORMAL HIGH
Potassium: 3.7
Sodium: 138

## 2010-11-15 LAB — URINE MICROSCOPIC-ADD ON

## 2010-11-15 LAB — WOUND CULTURE
Culture: NO GROWTH
Culture: NO GROWTH
Gram Stain: NONE SEEN

## 2010-11-15 LAB — URINALYSIS, ROUTINE W REFLEX MICROSCOPIC
Bilirubin Urine: NEGATIVE
Glucose, UA: NEGATIVE
Ketones, ur: NEGATIVE
Leukocytes, UA: NEGATIVE
Nitrite: NEGATIVE
Protein, ur: NEGATIVE
Specific Gravity, Urine: 1.021
Urobilinogen, UA: 1
pH: 5.5

## 2010-11-15 LAB — CROSSMATCH
ABO/RH(D): A POS
Antibody Screen: NEGATIVE

## 2010-11-15 LAB — DIFFERENTIAL
Basophils Absolute: 0
Basophils Absolute: 0.1
Basophils Relative: 0
Basophils Relative: 1
Eosinophils Absolute: 0.1
Eosinophils Absolute: 0.1
Eosinophils Relative: 1
Eosinophils Relative: 2
Lymphocytes Relative: 20
Lymphocytes Relative: 35
Lymphs Abs: 2.7
Lymphs Abs: 2.9
Monocytes Absolute: 0.6
Monocytes Absolute: 0.9 — ABNORMAL HIGH
Monocytes Relative: 7
Monocytes Relative: 8
Neutro Abs: 10.3 — ABNORMAL HIGH
Neutro Abs: 4.2
Neutrophils Relative %: 55
Neutrophils Relative %: 72

## 2010-11-15 LAB — GRAM STAIN: Gram Stain: NONE SEEN

## 2010-11-15 LAB — COMPREHENSIVE METABOLIC PANEL
ALT: 28
AST: 25
Albumin: 3.5
Alkaline Phosphatase: 102
BUN: 7
CO2: 27
Calcium: 9.2
Chloride: 104
Creatinine, Ser: 0.77
GFR calc Af Amer: >60
GFR calc non Af Amer: >60
Glucose, Bld: 111 — ABNORMAL HIGH
Potassium: 4.1
Sodium: 141
Total Bilirubin: 0.7
Total Protein: 6.2

## 2010-11-15 LAB — URINE CULTURE
Colony Count: NO GROWTH
Culture: NO GROWTH
Special Requests: NEGATIVE

## 2010-11-15 LAB — ANAEROBIC CULTURE: Gram Stain: NONE SEEN

## 2010-11-15 LAB — PROTIME-INR
INR: 0.9
Prothrombin Time: 12.4

## 2010-11-15 LAB — APTT: aPTT: 31

## 2010-11-15 LAB — ABO/RH: ABO/RH(D): A POS

## 2010-12-09 ENCOUNTER — Encounter: Payer: Medicare Other | Attending: Physical Medicine & Rehabilitation | Admitting: Physical Medicine & Rehabilitation

## 2010-12-09 DIAGNOSIS — M545 Low back pain, unspecified: Secondary | ICD-10-CM | POA: Insufficient documentation

## 2010-12-09 DIAGNOSIS — M1710 Unilateral primary osteoarthritis, unspecified knee: Secondary | ICD-10-CM

## 2010-12-09 DIAGNOSIS — M5380 Other specified dorsopathies, site unspecified: Secondary | ICD-10-CM

## 2010-12-09 DIAGNOSIS — M549 Dorsalgia, unspecified: Secondary | ICD-10-CM | POA: Insufficient documentation

## 2010-12-09 DIAGNOSIS — M25519 Pain in unspecified shoulder: Secondary | ICD-10-CM | POA: Insufficient documentation

## 2010-12-09 DIAGNOSIS — G894 Chronic pain syndrome: Secondary | ICD-10-CM | POA: Insufficient documentation

## 2010-12-09 DIAGNOSIS — M51379 Other intervertebral disc degeneration, lumbosacral region without mention of lumbar back pain or lower extremity pain: Secondary | ICD-10-CM | POA: Insufficient documentation

## 2010-12-09 DIAGNOSIS — M47817 Spondylosis without myelopathy or radiculopathy, lumbosacral region: Secondary | ICD-10-CM

## 2010-12-09 DIAGNOSIS — M479 Spondylosis, unspecified: Secondary | ICD-10-CM | POA: Insufficient documentation

## 2010-12-09 DIAGNOSIS — M5137 Other intervertebral disc degeneration, lumbosacral region: Secondary | ICD-10-CM | POA: Insufficient documentation

## 2010-12-09 DIAGNOSIS — IMO0001 Reserved for inherently not codable concepts without codable children: Secondary | ICD-10-CM

## 2010-12-09 DIAGNOSIS — R109 Unspecified abdominal pain: Secondary | ICD-10-CM | POA: Insufficient documentation

## 2010-12-09 NOTE — Assessment & Plan Note (Signed)
[name] is back regarding her chronic pain syndrome.  From a knee standpoint, she is still pretty much in the same boat with pain more with exertion and weightbearing.  Medications seemed to fairly control that pain.  She has complaints of more left flank/side pain as well as pain in her left upper back/shoulder region.  Pain is about 7-8/10.  Our nurse practitioner started her on some Flexeril at last visit that seemed to help her a bit.  Sleep is poor.  REVIEW OF SYSTEMS:  Notable for multiple items.  Full 12-point review is in the written health and history section of the chart.  SOCIAL HISTORY:  The patient is married, living with her spouse.  She is still smoking half-pack of cigarettes per day.  PHYSICAL EXAMINATION:  VITAL SIGNS:  Blood pressure is 121/78, pulse is 101, respiratory rate 18 and she is satting 92% on room air. GENERAL:  The patient is pleasant and alert. EXTREMITIES:  She remains antalgic on the left leg.  The left leg generally has some more edema than the right, but is cool to touch. Wound is well approximated.  She has pain with palpation over the left lower back and into the left and right flanks.  Pain on either side is worse with rotation as well as lateral bending to the respective side. She had minimal pain with flexion or extension, however.  Left shoulder is tender along the rhomboid specifically the lower rhomboids.  She had some pain with rotator cuff and scapular movement.  Strength is generally preserved throughout 5/5 except for pain inhibition at the left knee.  ASSESSMENT: 1. Chronic low back pain. 2. Facet arthropathy. 3. Degenerative disk disease. 4. Osteoarthritis of the knees. 5. Myofascial pain over the lower trunk and/low back as well as the     left rhomboid.  PLAN: 1. After informed consent, I injected the trigger point and the left     rhomboid with 2 mL of 1% lidocaine.  The patient tolerated well. 2. Continue with Flexeril for  muscle spasms 1 t.i.d. p.r.n. 3. I refilled her oxycodone #120 15 mg and MS Contin 100 mg 1 q.12 h.     scheduled today. 4. Refilled Celexa, Pamelor and propranolol.  Regarding the     propranolol, I asked her to try go up to 20 mg q.8 h. for migraine     prophylaxis.  She did note that 10 mg is all that beneficial.  If     this does not prove helpful, we will look at tapering this     medication off. 5. We will see her back in a month.     Ranelle Oyster, M.D. Electronically Signed    ZTS/MedQ D:  12/09/2010 13:21:17  T:  12/09/2010 13:42:04  Job #:  [id number]

## 2011-01-06 ENCOUNTER — Encounter: Payer: Medicare Other | Attending: Neurosurgery | Admitting: Neurosurgery

## 2011-01-06 DIAGNOSIS — M25569 Pain in unspecified knee: Secondary | ICD-10-CM | POA: Insufficient documentation

## 2011-01-06 DIAGNOSIS — M1710 Unilateral primary osteoarthritis, unspecified knee: Secondary | ICD-10-CM | POA: Insufficient documentation

## 2011-01-06 DIAGNOSIS — M545 Low back pain, unspecified: Secondary | ICD-10-CM | POA: Insufficient documentation

## 2011-01-06 DIAGNOSIS — R42 Dizziness and giddiness: Secondary | ICD-10-CM | POA: Insufficient documentation

## 2011-01-06 DIAGNOSIS — F411 Generalized anxiety disorder: Secondary | ICD-10-CM | POA: Insufficient documentation

## 2011-01-06 DIAGNOSIS — M129 Arthropathy, unspecified: Secondary | ICD-10-CM | POA: Insufficient documentation

## 2011-01-06 DIAGNOSIS — R279 Unspecified lack of coordination: Secondary | ICD-10-CM | POA: Insufficient documentation

## 2011-01-06 DIAGNOSIS — M51379 Other intervertebral disc degeneration, lumbosacral region without mention of lumbar back pain or lower extremity pain: Secondary | ICD-10-CM | POA: Insufficient documentation

## 2011-01-06 DIAGNOSIS — M5106 Intervertebral disc disorders with myelopathy, lumbar region: Secondary | ICD-10-CM

## 2011-01-06 DIAGNOSIS — R209 Unspecified disturbances of skin sensation: Secondary | ICD-10-CM | POA: Insufficient documentation

## 2011-01-06 DIAGNOSIS — F3289 Other specified depressive episodes: Secondary | ICD-10-CM | POA: Insufficient documentation

## 2011-01-06 DIAGNOSIS — G894 Chronic pain syndrome: Secondary | ICD-10-CM

## 2011-01-06 DIAGNOSIS — M5137 Other intervertebral disc degeneration, lumbosacral region: Secondary | ICD-10-CM | POA: Insufficient documentation

## 2011-01-06 DIAGNOSIS — F329 Major depressive disorder, single episode, unspecified: Secondary | ICD-10-CM | POA: Insufficient documentation

## 2011-01-06 DIAGNOSIS — M7989 Other specified soft tissue disorders: Secondary | ICD-10-CM | POA: Insufficient documentation

## 2011-01-07 NOTE — Assessment & Plan Note (Signed)
This is a patient of Dr. Riley Kill seen for chronic low back and knee pain. She reports no change in her pain, it is 7 or 8.  It is sharp, burning, dull, stabbing, tingling, aching, constant pain.  General activity level is none.  Sleep patterns are poor.  Walking, bending, standing and activity aggravate.  Rest, heat, medication and injections help.  She uses a cane for ambulation.  She climb steps, drive.  She can walk about 10 minutes at a time.  She is on disability.  She needs help with bathing, meal prep, and household duties.  Review of systems are notable for difficulties as described above as well as some weight fluctuations, night sweats, GI issues, limb swelling, bladder control issues, weakness, paresthesias, trouble walking, dizziness, depression and anxiety.  She has panic attack with suicidal thoughts, but she has no plan.  Encouraged her to see a primary care if it worsens or the ED at [facility].  Past medical history, social history, and family history are unchanged.  PHYSICAL EXAMINATION:  Her blood pressure is 143/76, pulse 120, respirations 18, O2 sats 97% on room air.  Motor strength and sensation are somewhat diminished in the upper and lower extremities given her pain.  Constitutionally, she is obese.  She is alert and oriented x3.  ASSESSMENT: 1. Chronic low back pain. 2. Facet arthropathy. 3. Degenerative disk disease. 4. Osteoarthritis of the knees.  PLAN: 1. Refill MS Contin 100 mg one p.o. q.12 hours, 60 with no refill. 2. Oxycodone 15 mg one p.o. q.6 hours p.r.n., 120 with no refill.  Her     questions were encouraged and answered.  We will see her in a     month.     [name] L. Blima Dessert Electronically Signed    RLW/MedQ D:  01/06/2011 14:32:16  T:  01/07/2011 06:12:44  Job #:  [id number]

## 2011-02-01 ENCOUNTER — Encounter: Payer: Medicare Other | Attending: Neurosurgery | Admitting: Neurosurgery

## 2011-02-01 DIAGNOSIS — M51379 Other intervertebral disc degeneration, lumbosacral region without mention of lumbar back pain or lower extremity pain: Secondary | ICD-10-CM | POA: Insufficient documentation

## 2011-02-01 DIAGNOSIS — M545 Low back pain, unspecified: Secondary | ICD-10-CM | POA: Insufficient documentation

## 2011-02-01 DIAGNOSIS — M1710 Unilateral primary osteoarthritis, unspecified knee: Secondary | ICD-10-CM

## 2011-02-01 DIAGNOSIS — M5137 Other intervertebral disc degeneration, lumbosacral region: Secondary | ICD-10-CM | POA: Insufficient documentation

## 2011-02-01 DIAGNOSIS — M129 Arthropathy, unspecified: Secondary | ICD-10-CM | POA: Insufficient documentation

## 2011-02-01 DIAGNOSIS — M25569 Pain in unspecified knee: Secondary | ICD-10-CM | POA: Insufficient documentation

## 2011-02-01 DIAGNOSIS — G8929 Other chronic pain: Secondary | ICD-10-CM | POA: Insufficient documentation

## 2011-02-01 DIAGNOSIS — M5380 Other specified dorsopathies, site unspecified: Secondary | ICD-10-CM

## 2011-02-02 NOTE — Assessment & Plan Note (Signed)
This is a patient of Dr. Riley Kill seen for chronic low back and knee pain. She reports no change in her pain.  It is 6 or 7.  It is a sharp, stabbing, dull, tingling, aching pain.  General activity level is none. Sleep patterns are poor.  Walking, bending, standing, activity aggravate.  Rest, heat, injections, meds, and TENs unit help.  She uses a cane for ambulation.  She can climb steps and drive.  She can walk about 5 minutes at a time.  She is on disability.  She needs some help with household duties.  REVIEW OF SYSTEMS:  Notable for difficulties described above as well as some night sweats, weight gain, weight loss, nausea, vomiting, constipation, abdominal pain, poor appetite, limb swelling, bladder control issues, weakness, numbness, tingling, trouble walking, spasms, dizziness, confusion, depression, anxiety, suicidal thoughts, panic attacks.  She is encouraged to see her primary care in the ED if that worsens.  No aberrant behaviors.  PAST MEDICAL HISTORY/SOCIAL HISTORY/FAMILY HISTORY:  Unchanged.  PHYSICAL EXAMINATION:  Blood pressure is 122/70, pulse 86, respirations 16, O2 sats 95 on room air.  Her motor strength and sensation are diminished in the upper and lower extremities throughout. Constitutionally she is morbidly obese.  She is alert and oriented x3. Very depressed affect.  She walks with a significant limp with single- point cane.  ASSESSMENT: 1. Chronic low back pain. 2. Facet arthropathy. 3. Degenerative disc disease. 4. Osteoarthritis of the knees.  PLAN: 1. Refill OxyContin 15 mg, 1 p.o. q.6 hours p.r.n., #120 with no     refill 2. MS Contin 100 mg 1 p.o. q.12 hours, #60 with no refill.  Her     questions were encouraged and answered.  We will see her in a     month.     Joselinne Lawal L. Blima Dessert Electronically Signed    RLW/MedQ D:  02/01/2011 13:10:42  T:  02/02/2011 04:29:51  Job #:  [id number]

## 2011-03-02 ENCOUNTER — Encounter: Payer: Medicare Other | Attending: Neurosurgery | Admitting: Neurosurgery

## 2011-03-02 DIAGNOSIS — M545 Low back pain, unspecified: Secondary | ICD-10-CM | POA: Insufficient documentation

## 2011-03-02 DIAGNOSIS — M5380 Other specified dorsopathies, site unspecified: Secondary | ICD-10-CM

## 2011-03-02 DIAGNOSIS — M129 Arthropathy, unspecified: Secondary | ICD-10-CM | POA: Insufficient documentation

## 2011-03-02 DIAGNOSIS — IMO0002 Reserved for concepts with insufficient information to code with codable children: Secondary | ICD-10-CM | POA: Insufficient documentation

## 2011-03-02 DIAGNOSIS — M1710 Unilateral primary osteoarthritis, unspecified knee: Secondary | ICD-10-CM | POA: Insufficient documentation

## 2011-03-02 DIAGNOSIS — M25569 Pain in unspecified knee: Secondary | ICD-10-CM | POA: Insufficient documentation

## 2011-03-02 NOTE — Assessment & Plan Note (Signed)
This is a patient of Dr. Rosalyn Charters, seen for chronic low back pain and knee pain.  She reports no change in her pain.  It is a sharp, burning, stabbing, constant pain.  General activity level is 9.  Sleep patterns are poor.  All activities aggravate.  Rest, heat, medication, TENS unit help.  She walks with a cane.  She does climb steps and drive.  She can walk about 10 minutes at a time.  Functionally, she is unemployed, she needs help with household duties.  REVIEW OF SYSTEMS:  Notable for the difficulties described above as well as some bladder control issues, night sweats, weight gain, GI issues, limb swelling, paresthesia, dizziness, depression, anxiety, confusion, panic attacks with suicidal thoughts.  She is encouraged to follow up with ED or primary care if that worsens.  No aberrant behaviors.  Last pill count and UDS correct.  PAST MEDICAL HISTORY:  Unchanged.  SOCIAL HISTORY:  Unchanged.  FAMILY HISTORY:  Unchanged.  PHYSICAL EXAMINATION:  VITAL SIGNS:  Blood pressure is 121/64, pulse 88, respirations 16, O2 sats 95 on room air. CONSTITUTIONAL:  She is obese.  She is alert and oriented x3. NEUROLOGIC:  She does walk with a limp and a brace on the lower extremity.  Sensation is intact.  Strength is somewhat diminished in the lower extremities.  ASSESSMENT: 1. Chronic low back pain. 2. Facet arthropathy. 3. Degenerative disk disease. 4. Osteoarthritis of the knees.  PLAN: 1. Refill MS Contin 100 mg 1 p.o. q.12 h., 60 with no refill. 2. Oxycodone 15 mg 1 p.o. q.6 h. p.r.n., 120 with no refill.  Her     questions were encouraged and answered.  She will follow up in a     month.     [name] L. [name] Electronically Signed    RLW/MedQ D:  03/02/2011 10:45:12  T:  03/02/2011 20:51:36  Job #:  [id number]

## 2011-03-28 ENCOUNTER — Encounter: Payer: Medicare Other | Attending: Physical Medicine & Rehabilitation | Admitting: *Deleted

## 2011-03-28 ENCOUNTER — Encounter: Payer: Self-pay | Admitting: *Deleted

## 2011-03-28 VITALS — BP 140/82 | HR 122 | Resp 18 | Ht 69.0 in | Wt 239.0 lb

## 2011-03-28 DIAGNOSIS — G8929 Other chronic pain: Secondary | ICD-10-CM | POA: Insufficient documentation

## 2011-03-28 DIAGNOSIS — M545 Low back pain, unspecified: Secondary | ICD-10-CM | POA: Insufficient documentation

## 2011-03-28 DIAGNOSIS — M129 Arthropathy, unspecified: Secondary | ICD-10-CM | POA: Insufficient documentation

## 2011-03-28 DIAGNOSIS — M1710 Unilateral primary osteoarthritis, unspecified knee: Secondary | ICD-10-CM | POA: Insufficient documentation

## 2011-03-28 DIAGNOSIS — G894 Chronic pain syndrome: Secondary | ICD-10-CM

## 2011-03-28 DIAGNOSIS — IMO0002 Reserved for concepts with insufficient information to code with codable children: Secondary | ICD-10-CM | POA: Insufficient documentation

## 2011-03-28 MED ORDER — OXYCODONE HCL 15 MG PO TABS: 15.0000 mg | ORAL_TABLET | Freq: Four times a day (QID) | ORAL | Status: DC | PRN

## 2011-03-28 MED ORDER — MORPHINE SULFATE CR 100 MG PO TB12: 100.0000 mg | ORAL_TABLET | Freq: Two times a day (BID) | ORAL | Status: DC

## 2011-03-28 NOTE — Progress Notes (Signed)
[name] c/o increased sensitivity in knees lately; cannot stand weight of bedcovers on her legs. Not wearing brace today due to knee tenderness. Gait is slow and unsteady. Denies recent falls.

## 2011-04-05 ENCOUNTER — Ambulatory Visit (INDEPENDENT_AMBULATORY_CARE_PROVIDER_SITE_OTHER): Payer: Medicare Other | Admitting: Family Medicine

## 2011-04-05 ENCOUNTER — Encounter: Payer: Self-pay | Admitting: Family Medicine

## 2011-04-05 VITALS — BP 146/83 | HR 128 | Ht 69.0 in | Wt 239.0 lb

## 2011-04-05 DIAGNOSIS — R110 Nausea: Secondary | ICD-10-CM

## 2011-04-05 DIAGNOSIS — Z13220 Encounter for screening for lipoid disorders: Secondary | ICD-10-CM

## 2011-04-05 DIAGNOSIS — C4491 Basal cell carcinoma of skin, unspecified: Secondary | ICD-10-CM | POA: Insufficient documentation

## 2011-04-05 DIAGNOSIS — Z720 Tobacco use: Secondary | ICD-10-CM

## 2011-04-05 DIAGNOSIS — G9050 Complex regional pain syndrome I, unspecified: Secondary | ICD-10-CM

## 2011-04-05 DIAGNOSIS — Z85850 Personal history of malignant neoplasm of thyroid: Secondary | ICD-10-CM

## 2011-04-05 DIAGNOSIS — Z1231 Encounter for screening mammogram for malignant neoplasm of breast: Secondary | ICD-10-CM

## 2011-04-05 DIAGNOSIS — C73 Malignant neoplasm of thyroid gland: Secondary | ICD-10-CM

## 2011-04-05 DIAGNOSIS — G43119 Migraine with aura, intractable, without status migrainosus: Secondary | ICD-10-CM | POA: Insufficient documentation

## 2011-04-05 DIAGNOSIS — Z85828 Personal history of other malignant neoplasm of skin: Secondary | ICD-10-CM

## 2011-04-05 DIAGNOSIS — F17200 Nicotine dependence, unspecified, uncomplicated: Secondary | ICD-10-CM

## 2011-04-05 DIAGNOSIS — Z87891 Personal history of nicotine dependence: Secondary | ICD-10-CM | POA: Insufficient documentation

## 2011-04-05 DIAGNOSIS — Z1211 Encounter for screening for malignant neoplasm of colon: Secondary | ICD-10-CM

## 2011-04-05 LAB — COMPLETE METABOLIC PANEL WITH GFR
ALT: 29 U/L (ref 0–35)
CO2: 20 mEq/L (ref 19–32)
Creat: 0.7 mg/dL (ref 0.50–1.10)
GFR, Est African American: >89 mL/min
GFR, Est Non African American: >89 mL/min
Glucose, Bld: 124 mg/dL — ABNORMAL HIGH (ref 70–99)
Total Bilirubin: 0.5 mg/dL (ref 0.3–1.2)

## 2011-04-05 LAB — LIPID PANEL
HDL: 49 mg/dL (ref >39)
Triglycerides: 235 mg/dL — ABNORMAL HIGH (ref <150)

## 2011-04-05 LAB — TSH: TSH: 6.098 u[IU]/mL — ABNORMAL HIGH (ref 0.350–4.500)

## 2011-04-05 NOTE — Patient Instructions (Signed)
We will call you with your lab results. If you don't here from us in about a week then please give us a call at [phone].  

## 2011-04-05 NOTE — Progress Notes (Signed)
Subjective:    Patient ID: [name], female    DOB: [birth year], 51 y.o.   MRN: [id number]  HPI  Here to estab care.  BP usually runs in the 120s.  Normall BP is well controlled.  SE with amlodipine.  No BP meds on her list.   Seen at [facility].  Dr. Riley Kill.  There for almost 9 years for chronic pain, RSD. He rx most of her meds  Hx of basal cell Ca- has several new places on her face. Will refer to Derm for full skin check.   Tob Abuse - She and her husband plan on quitting. She is looking at the e-cigarettes.  Hasn't set a quit date.    Hx of thyroid cancer- no new sxs but overdue for her yearly bloodwork and [place].    Review of Systems  Constitutional: Positive for diaphoresis. Negative for fever and unexpected weight change.       Weakness   HENT: Negative for hearing loss, rhinorrhea and tinnitus.        Allergies.   Eyes: Negative for visual disturbance.  Respiratory: Negative for cough and wheezing.   Cardiovascular: Negative for chest pain and palpitations.  Gastrointestinal: Positive for nausea and vomiting. Negative for diarrhea and blood in stool.  Genitourinary: Negative for vaginal bleeding, vaginal discharge and difficulty urinating.  Musculoskeletal: Negative for myalgias and arthralgias.  Skin: Negative for rash.       Change in mole.   Neurological: Positive for headaches.  Hematological: Negative for adenopathy. Does not bruise/bleed easily.  Psychiatric/Behavioral: Positive for sleep disturbance and dysphoric mood. The patient is nervous/anxious.     BP 146/83  Pulse 128  Ht 5\' 9"  (1.753 m)  Wt 239 lb (108.41 kg)  BMI 35.29 kg/m2    Allergies  Allergen Reactions  . Aspirin Other (See Comments)    Abdominal bleeding   . Ibuprofen Swelling  . Imitrex (Sumatriptan Base) Other (See Comments)    Drops BP   . Norvasc (Amlodipine Besylate) Swelling  . Nsaids Swelling  . Tape     Skin blisters under surgical tape    Past  Medical History  Diagnosis Date  . Chronic pain syndrome   . Intervertebral lumbar disc disorder with myelopathy, lumbar region   . Facet syndrome, lumbar   . Primary localized osteoarthrosis, lower leg   . Degeneration of lumbar or lumbosacral intervertebral disc   . Unspecified musculoskeletal disorders and symptoms referable to neck     cervical/trapezius  . Migraine without aura, with intractable migraine, so stated, without mention of status migrainosus   . Depression   . Lumbosacral spondylosis without myelopathy     Past Surgical History  Procedure Date  . Tonsillectomy 09/1972  . Ulnar nerve entrapment Feb 1995    left elbow  . Carpal tunnel release 07/1993    both hands  . Tubal ligation Sept 1999  . Hemorroidectomy July 2003  . Knee arthroplasty 09/2001, 05/2003    left knee, chondromalasia  . Abdominal hysterectomy May 2004  . Replacement total knee Nov 2005    Left   . Thyroid lobectomy 01/2005    malignant area removed from right lobe  . Basel cell carcinoma 06/2005, 07/2005    nasal tip and reconstruction  . Knee arthroscopy jan 2005    Right knee, tisse release, chrondromalacia    History   Social History  . Marital Status: Married    Spouse Name: Sullivan Lone  Number of Children: N/A  . Years of Education: N/A   Occupational History  . On disability    Social History Main Topics  . Smoking status: Current Everyday Smoker -- 0.5 packs/day for 39 years    Types: Cigarettes  . Smokeless tobacco: Not on file   Comment: going to try e-sticks to quit  . Alcohol Use: No  . Drug Use: No  . Sexually Active: Not Currently -- Female partner(s)   Other Topics Concern  . Not on file   Social History Narrative   8-10 cups of soda a day.  On disability. No regular exercise.      Family History  Problem Relation Age of Onset  . Depression Mother   . Hypertension Mother   . Hypertension Father   . Heart failure Father   . Diabetes Father   . Heart attack  Father          Objective:   Physical Exam  Constitutional: She is oriented to person, place, and time. She appears well-developed and well-nourished.  HENT:  Head: Normocephalic and atraumatic.  Neck: Neck supple. No thyromegaly present.  Cardiovascular: Normal rate, regular rhythm and normal heart sounds.   Pulmonary/Chest: Effort normal and breath sounds normal.  Lymphadenopathy:    She has no cervical adenopathy.  Neurological: She is alert and oriented to person, place, and time.  Skin: Skin is warm and dry.  Psychiatric: She has a normal mood and affect. Her behavior is normal.          Assessment & Plan:  Hx of Thyroid Cancer- Will schedule for [place] adn check TSH  Elevated BP - Schedule CPE in next month nd will recheck BP  Tob Abuse - Gave support.  Call if need additional help  Basal cell with new skin finding on face - REfer to Derm for full skin check.    Due for screening lipids and mammo. Given lab slip.  Will refer for screening mammo.    Due for screening colon cancer. Will refer.

## 2011-04-06 ENCOUNTER — Other Ambulatory Visit: Payer: Self-pay | Admitting: Family Medicine

## 2011-04-06 ENCOUNTER — Encounter: Payer: Self-pay | Admitting: Family Medicine

## 2011-04-06 ENCOUNTER — Encounter: Payer: Self-pay | Admitting: Gastroenterology

## 2011-04-06 DIAGNOSIS — E785 Hyperlipidemia, unspecified: Secondary | ICD-10-CM | POA: Insufficient documentation

## 2011-04-06 MED ORDER — LEVOTHYROXINE SODIUM 25 MCG PO TABS: 25.0000 ug | ORAL_TABLET | Freq: Every day | ORAL | Status: DC

## 2011-04-06 MED ORDER — PRAVASTATIN SODIUM 40 MG PO TABS: 40.0000 mg | ORAL_TABLET | Freq: Every day | ORAL | Status: DC

## 2011-04-12 ENCOUNTER — Ambulatory Visit (AMBULATORY_SURGERY_CENTER): Payer: Medicare Other | Admitting: *Deleted

## 2011-04-12 VITALS — Ht 69.0 in | Wt 237.0 lb

## 2011-04-12 DIAGNOSIS — Z1211 Encounter for screening for malignant neoplasm of colon: Secondary | ICD-10-CM

## 2011-04-12 MED ORDER — PEG-KCL-NACL-NASULF-NA ASC-C 100 G PO SOLR: ORAL | Status: DC

## 2011-04-12 NOTE — Progress Notes (Signed)
Pt had a colonoscopy in [facility] 12 years ago.  Doesn't remember who did the procedure but states she didn't have any polyps  No allergy to egg or soy products

## 2011-04-18 ENCOUNTER — Ambulatory Visit
Admission: RE | Admit: 2011-04-18 | Discharge: 2011-04-18 | Disposition: A | Discharge status: Home / Self Care | Payer: Medicare Other | Source: Ambulatory Visit | Attending: Family Medicine | Admitting: Family Medicine

## 2011-04-18 DIAGNOSIS — C73 Malignant neoplasm of thyroid gland: Secondary | ICD-10-CM

## 2011-04-18 DIAGNOSIS — Z1231 Encounter for screening mammogram for malignant neoplasm of breast: Secondary | ICD-10-CM

## 2011-04-20 ENCOUNTER — Ambulatory Visit (INDEPENDENT_AMBULATORY_CARE_PROVIDER_SITE_OTHER): Payer: Medicare Other | Admitting: Family Medicine

## 2011-04-20 ENCOUNTER — Encounter: Payer: Self-pay | Admitting: Family Medicine

## 2011-04-20 ENCOUNTER — Telehealth: Payer: Self-pay | Admitting: Family Medicine

## 2011-04-20 VITALS — BP 132/85 | HR 95 | Ht 69.0 in | Wt 241.0 lb

## 2011-04-20 DIAGNOSIS — R7309 Other abnormal glucose: Secondary | ICD-10-CM

## 2011-04-20 DIAGNOSIS — Z0000 Encounter for general adult medical examination without abnormal findings: Secondary | ICD-10-CM

## 2011-04-20 DIAGNOSIS — F329 Major depressive disorder, single episode, unspecified: Secondary | ICD-10-CM | POA: Insufficient documentation

## 2011-04-20 DIAGNOSIS — F339 Major depressive disorder, recurrent, unspecified: Secondary | ICD-10-CM | POA: Insufficient documentation

## 2011-04-20 DIAGNOSIS — F32A Depression, unspecified: Secondary | ICD-10-CM

## 2011-04-20 DIAGNOSIS — Z9181 History of falling: Secondary | ICD-10-CM

## 2011-04-20 DIAGNOSIS — Z23 Encounter for immunization: Secondary | ICD-10-CM

## 2011-04-20 NOTE — Progress Notes (Signed)
Subjective:    [name] is a 51 y.o. female who presents for Medicare Annual/Subsequent preventive examination.  Preventive Screening-Counseling & Management  Tobacco History  Smoking status  . Current Everyday Smoker -- 0.5 packs/day for 39 years  . Types: Cigarettes  Smokeless tobacco  . Never Used  Comment: going to try e-sticks to quit     Problems Prior to Visit 1.   Current Problems (verified) Patient Active Problem List  Diagnoses  . Basal cell cancer  . Tobacco abuse  . RSD (reflex sympathetic dystrophy)  . Migraine with aura  . History of thyroid cancer  . Hyperlipidemia    Medications Prior to Visit Current Outpatient Prescriptions on File Prior to Visit  Medication Sig Dispense Refill  . citalopram (CELEXA) 40 MG tablet Take 40 mg by mouth at bedtime.      . cyclobenzaprine (FLEXERIL) 5 MG tablet Take 5 mg by mouth 3 (three) times daily as needed.      . diclofenac sodium (VOLTAREN) 1 % GEL Apply 1 application topically 4 (four) times daily. L knee and lower back      . gabapentin (NEURONTIN) 600 MG tablet Take 600 mg by mouth 4 (four) times daily.      Marland Kitchen levothyroxine (SYNTHROID, LEVOTHROID) 25 MCG tablet Take 1 tablet (25 mcg total) by mouth daily.  30 tablet  3  . morphine (MS CONTIN) 100 MG 12 hr tablet Take 1 tablet (100 mg total) by mouth every 12 (twelve) hours.  60 tablet  0  . nortriptyline (PAMELOR) 25 MG capsule Take 25 mg by mouth at bedtime.      Marland Kitchen oxyCODONE (ROXICODONE) 15 MG immediate release tablet Take 1 tablet (15 mg total) by mouth every 6 (six) hours as needed for pain.  120 tablet  0  . peg 3350 powder (MOVIPREP) 100 G SOLR Moviprep as directed  1 kit  0  . pravastatin (PRAVACHOL) 40 MG tablet Take 1 tablet (40 mg total) by mouth at bedtime.  30 tablet  3  . promethazine (PHENERGAN) 12.5 MG tablet Take 12.5 mg by mouth every 12 (twelve) hours.      . rizatriptan (MAXALT) 10 MG tablet Take 10 mg by mouth as needed. May repeat in  2 hours if needed        Current Medications (verified) Current Outpatient Prescriptions  Medication Sig Dispense Refill  . citalopram (CELEXA) 40 MG tablet Take 40 mg by mouth at bedtime.      . cyclobenzaprine (FLEXERIL) 5 MG tablet Take 5 mg by mouth 3 (three) times daily as needed.      . diclofenac sodium (VOLTAREN) 1 % GEL Apply 1 application topically 4 (four) times daily. L knee and lower back      . gabapentin (NEURONTIN) 600 MG tablet Take 600 mg by mouth 4 (four) times daily.      Marland Kitchen levothyroxine (SYNTHROID, LEVOTHROID) 25 MCG tablet Take 1 tablet (25 mcg total) by mouth daily.  30 tablet  3  . morphine (MS CONTIN) 100 MG 12 hr tablet Take 1 tablet (100 mg total) by mouth every 12 (twelve) hours.  60 tablet  0  . nortriptyline (PAMELOR) 25 MG capsule Take 25 mg by mouth at bedtime.      Marland Kitchen oxyCODONE (ROXICODONE) 15 MG immediate release tablet Take 1 tablet (15 mg total) by mouth every 6 (six) hours as needed for pain.  120 tablet  0  . peg 3350 powder (MOVIPREP) 100 G  SOLR Moviprep as directed  1 kit  0  . pravastatin (PRAVACHOL) 40 MG tablet Take 1 tablet (40 mg total) by mouth at bedtime.  30 tablet  3  . promethazine (PHENERGAN) 12.5 MG tablet Take 12.5 mg by mouth every 12 (twelve) hours.      . rizatriptan (MAXALT) 10 MG tablet Take 10 mg by mouth as needed. May repeat in 2 hours if needed         Allergies (verified) Aspirin; Ibuprofen; Imitrex; Norvasc; Nsaids; and Tape   PAST HISTORY  Family History Family History  Problem Relation Age of Onset  . Depression Mother   . Hypertension Mother   . Hypertension Father   . Heart failure Father   . Diabetes Father   . Heart attack Father   . Colon cancer Neg Hx   . Esophageal cancer Neg Hx   . Stomach cancer Neg Hx   . Rectal cancer Neg Hx     Social History History  Substance Use Topics  . Smoking status: Current Everyday Smoker -- 0.5 packs/day for 39 years    Types: Cigarettes  . Smokeless tobacco: Never Used    Comment: going to try e-sticks to quit  . Alcohol Use: No     Are there smokers in your home (other than you)? Yes  Risk Factors Current exercise habits: no exercise secondary to back problems  Dietary issues discussed: generally healthy   Cardiac risk factors: dyslipidemia, obesity (BMI >= 30 kg/m2), sedentary lifestyle and smoking/ tobacco exposure.  Depression Screen (Note: if answer to either of the following is "Yes", a more complete depression screening is indicated)   Over the past two weeks, have you felt down, depressed or hopeless? Yes  Over the past two weeks, have you felt little interest or pleasure in doing things? Yes  Have you lost interest or pleasure in daily life? Yes  Do you often feel hopeless? Yes    Activities of Daily Living In your present state of health, do you have any difficulty performing the following activities?:  Driving? No Managing money?  No Feeding yourself? No Getting from bed to chair? No Climbing a flight of stairs? No Preparing food and eating?: No Bathing or showering? No Getting dressed: No Getting to the toilet? No Using the toilet:No Moving around from place to place: No In the past year have you fallen or had a near fall?:No   Are you sexually active?  Yes  Do you have more than one partner?  No  Hearing Difficulties: No Do you often ask people to speak up or repeat themselves? No Do you experience ringing or noises in your ears? No Do you have difficulty understanding soft or whispered voices? No   Do you feel that you have a problem with memory? No  Do you often misplace items? No  Do you feel safe at home?  Yes  Cognitive Testing  Alert? Yes  Normal Appearance?Yes  Oriented to person? Yes  Place? Yes   Time? Yes  Recall of three objects?  Yes  Can perform simple calculations? Yes  Displays appropriate judgment?Yes  Can read the correct time from a watch face?Yes   List the Names of Other  Physician/Practitioners you currently use: 1.  Dr. Riley Kill for pain management.   Indicate any recent Medical Services you may have received from other than [facility] providers in the past year (date may be approximate).   There is no immunization history on file for this  patient.  Screening Tests Health Maintenance  Topic Date Due  . Colonoscopy  02/06/2009  . Influenza Vaccine  11/07/2011  . Mammogram  04/17/2013  . Tetanus/tdap  02/07/2016    All answers were reviewed with the patient and necessary referrals were made:  Raychell Holcomb,Laytoya, MD   04/20/2011   History reviewed: allergies, current medications, past family history, past medical history, past social history, past surgical history and problem list  Review of Systems A comprehensive review of systems was negative.    Objective:     Vision by Snellen chart: right eye:20/30, left eye:20/30  Body mass index is 35.59 kg/(m^2). BP 132/85  Pulse 95  Ht 5\' 9"  (1.753 m)  Wt 241 lb (109.317 kg)  BMI 35.59 kg/m2  BP 132/85  Pulse 95  Ht 5\' 9"  (1.753 m)  Wt 241 lb (109.317 kg)  BMI 35.59 kg/m2  General Appearance:    Alert, cooperative, no distress, appears stated age  Head:    Normocephalic, without obvious abnormality, atraumatic  Eyes:    PERRL, conjunctiva/corneas clear, EOM's intact, fundi    benign, both eyes  Ears:    Normal TM's and external ear canals, both ears  Nose:   Nares normal, septum midline, mucosa normal, no drainage    or sinus tenderness  Throat:   Lips, mucosa, and tongue normal; teeth and gums normal  Neck:   Supple, symmetrical, trachea midline, no adenopathy;    thyroid:  no enlargement/tenderness/nodules; no carotid   bruit or JVD  Back:     Symmetric, no curvature, ROM normal, no CVA tenderness  Lungs:     Clear to auscultation bilaterally, respirations unlabored  Chest Wall:    No tenderness or deformity   Heart:    Regular rate and rhythm, S1 and S2 normal, no murmur, rub   or gallop  Breast  Exam:    No tenderness, masses, or nipple abnormality  Abdomen:     Soft, non-tender, bowel sounds active all four quadrants,    no masses, no organomegaly  Genitalia:    Normal female without lesion, discharge or tenderness  Rectal:    Normal tone, normal prostate, no masses or tenderness;   guaiac negative stool  Extremities:   Extremities normal, atraumatic, no cyanosis or edema  Pulses:   2+ and symmetric all extremities  Skin:   Skin color, texture, turgor normal, no rashes or lesions  Lymph nodes:   Cervical, supraclavicular, and axillary nodes normal  Neurologic:   CNII-XII intact, normal strength, sensation and reflexes    throughout       Assessment:     Annual Wellness Exam      Plan:     During the course of the visit the patient was educated and counseled about appropriate screening and preventive services including:    Pneumococcal vaccine   Smoking cessation counseling  Colonoscopy - She has it schedule fore [birth year] on [birth year].   Had her mammo done 2 days ago.   She does have depression based on her depression screening questionnaire. Her PHQ 9 score was 22 today which is significant for moderate to severe depression. I will have her come back for followup visit to discuss her mood. Abnormal glucose-she did have an elevated blood sugar reading on her fasting labs. Her A1c was reassuring today at 5.5  Lab Results  Component Value Date   HGBA1C 5.5 04/20/2011     Diet review for nutrition referral? Yes ____  Not Indicated _x___  Patient Instructions (the written plan) was given to the patient.  Medicare Attestation I have personally reviewed: The patient's medical and social history Their use of alcohol, tobacco or illicit drugs Their current medications and supplements The patient's functional ability including ADLs,fall risks, home safety risks, cognitive, and hearing and visual impairment Diet and physical activities Evidence for depression  or mood disorders  The patient's weight, height, BMI, and visual acuity have been recorded in the chart.  I have made referrals, counseling, and provided education to the patient based on review of the above and I have provided the patient with a written personalized care plan for preventive services.     Wandell Scullion,Daphine, MD   04/20/2011    Hyperlipidemia-she did start her side and is tolerating it well. We will recheck her lab work in about 2 months.  Hypothyroidism-we restarted her thyroid medication which he had been off of for quite some time. Recheck lab work in 2 months and we can adjust her medication.

## 2011-04-20 NOTE — Telephone Encounter (Signed)
Call pt: Based on the quiestionnaire she filled out her score was 22. I know she is already on citalopram. Is she interested in seeing a couselor?  I think it would be helpful. Or we could consider changing her med.  Has she tried something that has worked better than the citalopram?

## 2011-04-21 NOTE — Telephone Encounter (Signed)
Pt informed and states that she will think about it.

## 2011-04-21 NOTE — Telephone Encounter (Signed)
LM for pt to returncall

## 2011-04-21 NOTE — Telephone Encounter (Signed)
OK, that is fair but it could help her cope with her loss and dealing with her chronic medical problems.

## 2011-04-21 NOTE — Telephone Encounter (Signed)
Pt states that she has been on 3-4 different medications. Says that she feels the Celexa is working for her b/c she is not crying as much. States that she is feeling down right now due to her missing daughter and grandbaby since they have went back home. Says she is also down b/c she is in pain all the time and is a burden to her husband. States that she doesn't feel that counseling will help b/c it won't change her situation.

## 2011-04-25 ENCOUNTER — Other Ambulatory Visit: Payer: Medicare Other

## 2011-04-25 ENCOUNTER — Ambulatory Visit: Payer: Medicare Other

## 2011-04-26 ENCOUNTER — Encounter: Payer: Self-pay | Admitting: Gastroenterology

## 2011-04-26 ENCOUNTER — Ambulatory Visit (AMBULATORY_SURGERY_CENTER): Payer: Medicare Other | Admitting: Gastroenterology

## 2011-04-26 VITALS — BP 111/70 | HR 93 | Temp 98.2°F | Resp 17 | Ht 69.0 in | Wt 237.0 lb

## 2011-04-26 DIAGNOSIS — D175 Benign lipomatous neoplasm of intra-abdominal organs: Secondary | ICD-10-CM

## 2011-04-26 DIAGNOSIS — Z1211 Encounter for screening for malignant neoplasm of colon: Secondary | ICD-10-CM

## 2011-04-26 MED ORDER — SODIUM CHLORIDE 0.9 % IV SOLN: 500.0000 mL | INTRAVENOUS | Status: DC

## 2011-04-26 NOTE — Progress Notes (Signed)
Patient did not experience any of the following events: a burn prior to discharge; a fall within the facility; wrong site/side/patient/procedure/implant event; or a hospital transfer or hospital admission upon discharge from the facility. (G8907) Patient did not have preoperative order for IV antibiotic SSI prophylaxis. (G8918)  

## 2011-04-26 NOTE — Op Note (Signed)
[facility] [address] [place], [place]  [address]  COLONOSCOPY PROCEDURE REPORT  PATIENT:  [name], [name]  MR#:  [id number] BIRTHDATE:  1960/06/11, 50 yrs. old  GENDER:  female ENDOSCOPIST:  Vania Rea. Jarold Motto, MD, [facility] REF. BY:  [name], M.D. PROCEDURE DATE:  04/26/2011 PROCEDURE:  Average-risk screening colonoscopy G0121 ASA CLASS:  Class III INDICATIONS:  Routine Risk Screening MEDICATIONS:   propofol (Diprivan) 250 mg IV  DESCRIPTION OF PROCEDURE:   After the risks and benefits and of the procedure were explained, informed consent was obtained. Digital rectal exam was performed and revealed no abnormalities. The [id number] endoscope was introduced through the anus and advanced to the cecum, which was identified by both the appendix and ileocecal valve.  The quality of the prep was excellent, using MoviPrep.  The instrument was then slowly withdrawn as the colon was fully examined. <<PROCEDUREIMAGES>>  FINDINGS:  ULTRASONIC FINDINGS:  A lipoma was found in the right colon.  large right colon lipomas noted.see pictures. No polyps or cancers were seen.   Retroflexed views in the rectum revealed no abnormalities.    The scope was then withdrawn from the patient and the procedure completed.  COMPLICATIONS:  None ENDOSCOPIC IMPRESSION: 1) Lipoma in the right colon 2) Normal colonoscopy otherwise RECOMMENDATIONS: 1) Continue current colorectal screening recommendations for "routine risk" patients with a repeat colonoscopy in 10 years.  REPEAT EXAM:  No  ______________________________ Vania Rea. Jarold Motto, MD, [facility]  CC:  n. eSIGNED:   Vania Rea. Virdell Hoiland at 04/26/2011 02:00 PM  Felicity Pellegrini, [id number]

## 2011-04-26 NOTE — Progress Notes (Signed)
Pressure applied to abdomen to reach cecum.  

## 2011-04-26 NOTE — Patient Instructions (Signed)
YOU HAD AN ENDOSCOPIC PROCEDURE TODAY AT THE [facility]: Refer to the procedure report that was given to you for any specific questions about what was found during the examination.  If the procedure report does not answer your questions, please call your gastroenterologist to clarify.  If you requested that your care partner not be given the details of your procedure findings, then the procedure report has been included in a sealed envelope for you to review at your convenience later.  YOU SHOULD EXPECT: Some feelings of bloating in the abdomen. Passage of more gas than usual.  Walking can help get rid of the air that was put into your GI tract during the procedure and reduce the bloating. If you had a lower endoscopy (such as a colonoscopy or flexible sigmoidoscopy) you may notice spotting of blood in your stool or on the toilet paper. If you underwent a bowel prep for your procedure, then you may not have a normal bowel movement for a few days.  DIET: Your first meal following the procedure should be a light meal and then it is ok to progress to your normal diet.  A half-sandwich or bowl of soup is an example of a good first meal.  Heavy or fried foods are harder to digest and may make you feel nauseous or bloated.  Likewise meals heavy in dairy and vegetables can cause extra gas to form and this can also increase the bloating.  Drink plenty of fluids but you should avoid alcoholic beverages for 24 hours.  ACTIVITY: Your care partner should take you home directly after the procedure.  You should plan to take it easy, moving slowly for the rest of the day.  You can resume normal activity the day after the procedure however you should NOT DRIVE or use heavy machinery for 24 hours (because of the sedation medicines used during the test).    SYMPTOMS TO REPORT IMMEDIATELY: A gastroenterologist can be reached at any hour.  During normal business hours, 8:30 AM to 5:00 PM Monday through Friday,  call [phone].  After hours and on weekends, please call the GI answering service at [phone] who will take a message and have the physician on call contact you.   Following lower endoscopy (colonoscopy or flexible sigmoidoscopy):  Excessive amounts of blood in the stool  Significant tenderness or worsening of abdominal pains  Swelling of the abdomen that is new, acute  Fever of 100F or higher    FOLLOW UP: If any biopsies were taken you will be contacted by phone or by letter within the next 1-3 weeks.  Call your gastroenterologist if you have not heard about the biopsies in 3 weeks.  Our staff will call the home number listed on your records the next business day following your procedure to check on you and address any questions or concerns that you may have at that time regarding the information given to you following your procedure. This is a courtesy call and so if there is no answer at the home number and we have not heard from you through the emergency physician on call, we will assume that you have returned to your regular daily activities without incident.  SIGNATURES/CONFIDENTIALITY: You and/or your care partner have signed paperwork which will be entered into your electronic medical record.  These signatures attest to the fact that that the information above on your After Visit Summary has been reviewed and is understood.  Full responsibility of the confidentiality   of this discharge information lies with you and/or your care-partner.     

## 2011-04-27 ENCOUNTER — Telehealth: Payer: Self-pay | Admitting: *Deleted

## 2011-04-27 NOTE — Telephone Encounter (Signed)
  Follow up Call-  Call back number 04/26/2011  Post procedure Call Back phone  # [phone]  Permission to leave phone message Yes     Patient questions:  Do you have a fever, pain , or abdominal swelling? no Pain Score  0 *  Have you tolerated food without any problems? yes  Have you been able to return to your normal activities? yes  Do you have any questions about your discharge instructions: Diet   no Medications  no Follow up visit  no  Do you have questions or concerns about your Care? no  Actions: * If pain score is 4 or above: No action needed, pain <4.

## 2011-05-02 ENCOUNTER — Encounter: Payer: Self-pay | Admitting: Physical Medicine & Rehabilitation

## 2011-05-02 ENCOUNTER — Encounter: Payer: Medicare Other | Attending: Neurosurgery | Admitting: Physical Medicine & Rehabilitation

## 2011-05-02 VITALS — BP 151/85 | HR 116 | Resp 20 | Ht 69.0 in | Wt 243.8 lb

## 2011-05-02 DIAGNOSIS — M5137 Other intervertebral disc degeneration, lumbosacral region: Secondary | ICD-10-CM

## 2011-05-02 DIAGNOSIS — M1710 Unilateral primary osteoarthritis, unspecified knee: Secondary | ICD-10-CM | POA: Insufficient documentation

## 2011-05-02 DIAGNOSIS — M1712 Unilateral primary osteoarthritis, left knee: Secondary | ICD-10-CM

## 2011-05-02 DIAGNOSIS — M47816 Spondylosis without myelopathy or radiculopathy, lumbar region: Secondary | ICD-10-CM

## 2011-05-02 DIAGNOSIS — M47817 Spondylosis without myelopathy or radiculopathy, lumbosacral region: Secondary | ICD-10-CM

## 2011-05-02 DIAGNOSIS — M51379 Other intervertebral disc degeneration, lumbosacral region without mention of lumbar back pain or lower extremity pain: Secondary | ICD-10-CM

## 2011-05-02 DIAGNOSIS — G43109 Migraine with aura, not intractable, without status migrainosus: Secondary | ICD-10-CM

## 2011-05-02 DIAGNOSIS — IMO0002 Reserved for concepts with insufficient information to code with codable children: Secondary | ICD-10-CM

## 2011-05-02 DIAGNOSIS — Z96652 Presence of left artificial knee joint: Secondary | ICD-10-CM | POA: Insufficient documentation

## 2011-05-02 DIAGNOSIS — M5136 Other intervertebral disc degeneration, lumbar region: Secondary | ICD-10-CM

## 2011-05-02 MED ORDER — MORPHINE SULFATE CR 100 MG PO TB12: 100.0000 mg | ORAL_TABLET | Freq: Two times a day (BID) | ORAL | Status: DC

## 2011-05-02 MED ORDER — OXYCODONE HCL 15 MG PO TABS: 15.0000 mg | ORAL_TABLET | Freq: Four times a day (QID) | ORAL | Status: DC | PRN

## 2011-05-02 MED ORDER — MORPHINE SULFATE CR 100 MG PO TB12: 100.0000 mg | ORAL_TABLET | Freq: Two times a day (BID) | ORAL | Status: AC

## 2011-05-02 MED ORDER — OXYCODONE HCL 15 MG PO TABS: 15.0000 mg | ORAL_TABLET | ORAL | Status: AC | PRN

## 2011-05-02 MED ORDER — RIZATRIPTAN BENZOATE 10 MG PO TABS: 10.0000 mg | ORAL_TABLET | ORAL | Status: DC | PRN

## 2011-05-02 MED ORDER — OXYCODONE HCL 15 MG PO TABS: 15.0000 mg | ORAL_TABLET | Freq: Four times a day (QID) | ORAL | Status: AC | PRN

## 2011-05-02 NOTE — Progress Notes (Signed)
Subjective:    Patient ID: [name], female    DOB: [birth year], 51 y.o.   MRN: [id number]  HPI [name] is back regarding her chronic pain issues.  Pain levels have been stable.  Screening testing done per family physician was done last month and was all negative.  She sees Cintia Gleed for her primary care currently.   Headaches have been holding steady.  She uses maxalt for rescue therapy with some success. She sometimes has the headache upon waking the next morning.   Low back and knee are unchanged. Her current medications make the pain tolerable.  She is moving her bowels and bladder without difficulty. She had a normal colonoscopy last month as part of her work up.   Pain Inventory Average Pain 6 Pain Right Now 9 My pain is constant, sharp, burning, dull, stabbing, tingling and aching  In the last 24 hours, has pain interfered with the following? General activity 9 Relation with others 9 Enjoyment of life 9 What TIME of day is your pain at its worst? changes, but mostly all of the time Sleep (in general) Poor  Pain is worse with: walking, bending, standing and some activites Pain improves with: rest, heat/ice, therapy/exercise, medication, TENS and injections Relief from Meds: 6  Mobility use a cane how many minutes can you walk? 5-10 ability to climb steps?  yes do you drive?  yes Do you have any goals in this area?  yes  Function disabled: date disabled 08/2005 I need assistance with the following:  bathing, meal prep, household duties and shopping Do you have any goals in this area?  yes  Neuro/Psych weakness numbness tremor tingling trouble walking spasms dizziness confusion depression anxiety suicidal thoughts Does not have a plan in place  Prior Studies Any changes since last visit?  no  Physicians involved in your care Any changes since last visit?  no       Review of Systems  Constitutional: Positive for diaphoresis and  unexpected weight change.       Weight gain, night sweats, poor appetite  Cardiovascular: Positive for leg swelling.  Gastrointestinal: Positive for nausea, vomiting and constipation.  Musculoskeletal: Positive for back pain and gait problem.  Neurological: Positive for tremors, weakness and numbness.  Psychiatric/Behavioral: Positive for suicidal ideas and dysphoric mood. The patient is nervous/anxious.        No plan for suicide identified, just thoughts, panic attacks       Objective:   Physical Exam  Constitutional: She is oriented to person, place, and time. She appears well-developed and well-nourished.       overweight  HENT:  Head: Normocephalic.  Eyes: Conjunctivae and EOM are normal. Pupils are equal, round, and reactive to light.  Neck: Normal range of motion.  Cardiovascular: Normal rate and regular rhythm.   Pulmonary/Chest: Effort normal and breath sounds normal.  Abdominal: Soft.  Musculoskeletal: Normal range of motion.       Left knee with warmth and swelling with pain with resisted flex and ext.  She can flex easily and touch her toes. She has pain with extension past 20 degrees. She has some discomfort with extension from seated position  She ambulates using a straight cane to off load the left knee.  Neurological: She is alert and oriented to person, place, and time. She has normal strength. No cranial nerve deficit or sensory deficit.  Skin: Skin is warm.  Psychiatric: She has a normal mood and affect. Her behavior is normal.  Judgment and thought content normal.          Assessment & Plan:  ASSESSMENT:  1. Chronic low back pain.  2. Facet arthropathy.  3. Degenerative disk disease.  4. Osteoarthritis of the knees. 5. Migraine headaches with aura  PLAN:  1. Refill MS Contin 100 mg 1 p.o. q.12 h., 60 with no refill.  I gave her second scripts for next month.  She has been stable with these meds. Encouraged ongoing activity as tolerated. 2. Oxycodone 15  mg 1 p.o. q.6 h. p.r.n., 120 with no refill with a script for next month. 3. maxalt was refilled today.    Her questions were encouraged and answered. She will follow up in a  Month. All in all she's doing fairly well right now.

## 2011-05-02 NOTE — Patient Instructions (Signed)
Continue activity to tolerance! 

## 2011-05-03 ENCOUNTER — Encounter: Payer: Self-pay | Admitting: Physical Medicine & Rehabilitation

## 2011-06-26 ENCOUNTER — Other Ambulatory Visit: Payer: Self-pay | Admitting: *Deleted

## 2011-06-26 MED ORDER — CITALOPRAM HYDROBROMIDE 40 MG PO TABS: 40.0000 mg | ORAL_TABLET | Freq: Every day | ORAL | Status: DC

## 2011-06-26 MED ORDER — NORTRIPTYLINE HCL 25 MG PO CAPS: 25.0000 mg | ORAL_CAPSULE | Freq: Every day | ORAL | Status: DC

## 2011-06-30 ENCOUNTER — Encounter: Payer: Medicare Other | Attending: Physical Medicine & Rehabilitation | Admitting: Physical Medicine and Rehabilitation

## 2011-06-30 ENCOUNTER — Encounter: Payer: Self-pay | Admitting: Physical Medicine and Rehabilitation

## 2011-06-30 VITALS — BP 133/71 | HR 109 | Ht 69.0 in | Wt 251.0 lb

## 2011-06-30 DIAGNOSIS — G8929 Other chronic pain: Secondary | ICD-10-CM | POA: Insufficient documentation

## 2011-06-30 DIAGNOSIS — M1710 Unilateral primary osteoarthritis, unspecified knee: Secondary | ICD-10-CM | POA: Insufficient documentation

## 2011-06-30 DIAGNOSIS — G43109 Migraine with aura, not intractable, without status migrainosus: Secondary | ICD-10-CM | POA: Insufficient documentation

## 2011-06-30 DIAGNOSIS — M545 Low back pain, unspecified: Secondary | ICD-10-CM

## 2011-06-30 DIAGNOSIS — M25561 Pain in right knee: Secondary | ICD-10-CM

## 2011-06-30 DIAGNOSIS — M25569 Pain in unspecified knee: Secondary | ICD-10-CM

## 2011-06-30 DIAGNOSIS — M129 Arthropathy, unspecified: Secondary | ICD-10-CM | POA: Insufficient documentation

## 2011-06-30 DIAGNOSIS — IMO0002 Reserved for concepts with insufficient information to code with codable children: Secondary | ICD-10-CM | POA: Insufficient documentation

## 2011-06-30 DIAGNOSIS — M533 Sacrococcygeal disorders, not elsewhere classified: Secondary | ICD-10-CM | POA: Insufficient documentation

## 2011-06-30 MED ORDER — OXYCODONE HCL 15 MG PO TABS: 15.0000 mg | ORAL_TABLET | Freq: Four times a day (QID) | ORAL | Status: DC | PRN

## 2011-06-30 MED ORDER — MORPHINE SULFATE ER 100 MG PO TBCR: 100.0000 mg | EXTENDED_RELEASE_TABLET | Freq: Two times a day (BID) | ORAL | Status: DC

## 2011-06-30 NOTE — Patient Instructions (Signed)
Continue with exercise program for your back and your knees. Continue with pain medication.

## 2011-06-30 NOTE — Progress Notes (Signed)
Subjective:    Patient ID: [name], female    DOB: 07/04/1960, 51 y.o.   MRN: [id number]  Shoulder Pain   Back Pain  Knee Pain   The patient is a 51 year old female female, who presents withLBP and bilateral knee pain , worse on the left s/p TKR .  The patient complains about  moderate to severe pain in her low back, especially in her sacrum when sitting for a prolonged time. Patient denies any radiating symptoms. Patient reports, that her symptoms are stable, she has good and bad days. Applying heat/ ice, taking medications ,changing positions alleviate the symptoms. Prolonged sitting or standing  aggrevates the symptoms. The patient grades his pain as a 7 /10. Patient is here for a refill med check.  Pain Inventory Average Pain 7 Pain Right Now 7 My pain is constant, sharp, burning, dull, stabbing, tingling and aching  In the last 24 hours, has pain interfered with the following? General activity 9 Relation with others 9 Enjoyment of life 9 What TIME of day is your pain at its worst? varies Sleep (in general) Poor  Pain is worse with: walking, bending, standing and some activites Pain improves with: rest, heat/ice, therapy/exercise, medication, TENS and injections Relief from Meds: 6  Mobility use a cane how many minutes can you walk? 5 ability to climb steps?  yes do you drive?  yes Do you have any goals in this area?  yes  Function disabled: date disabled 2005/2007 I need assistance with the following:  bathing, meal prep, household duties and shopping Do you have any goals in this area?  yes  Neuro/Psych bladder control problems weakness numbness tremor tingling trouble walking spasms dizziness confusion depression anxiety suicidal thoughts-no plan on going issue.  Prior Studies Any changes since last visit?  no  Physicians involved in your care Any changes since last visit?  no   Family History  Problem Relation Age of Onset  .  Depression Mother   . Hypertension Mother   . Hypertension Father   . Heart failure Father   . Diabetes Father   . Heart attack Father   . Colon cancer Neg Hx   . Esophageal cancer Neg Hx   . Stomach cancer Neg Hx   . Rectal cancer Neg Hx    History   Social History  . Marital Status: Married    Spouse Name: Sullivan Lone    Number of Children: 1  . Years of Education: some colle   Occupational History  . On disability    Social History Main Topics  . Smoking status: Current Everyday Smoker -- 0.5 packs/day for 39 years    Types: Cigarettes  . Smokeless tobacco: Never Used   Comment: going to try e-sticks to quit  . Alcohol Use: No  . Drug Use: No  . Sexually Active: Not Currently -- Female partner(s)   Other Topics Concern  . None   Social History Narrative   8-10 cups of soda a day.  On disability. No regular exercise.     Past Surgical History  Procedure Date  . Tonsillectomy 09/1972  . Ulnar nerve entrapment Feb 1995    left elbow  . Carpal tunnel release 07/1993    both hands  . Tubal ligation Sept 1999  . Hemorroidectomy July 2003  . Knee arthroplasty 09/2001, 05/2003    left knee, chondromalasia  . Abdominal hysterectomy May 2004  . Replacement total knee Nov 2005    Left   .  Thyroid lobectomy 01/2005    malignant area removed from right lobe  . Basel cell carcinoma 06/2005, 07/2005    nasal tip and reconstruction  . Knee arthroscopy jan 2005    Right knee, tisse release, chrondromalacia  . Colonoscopy   . Thyroid surgery     thyroid lobe removal   Past Medical History  Diagnosis Date  . Chronic pain syndrome   . Intervertebral lumbar disc disorder with myelopathy, lumbar region   . Facet syndrome, lumbar   . Primary localized osteoarthrosis, lower leg   . Degeneration of lumbar or lumbosacral intervertebral disc   . Unspecified musculoskeletal disorders and symptoms referable to neck     cervical/trapezius  . Migraine without aura, with intractable  migraine, so stated, without mention of status migrainosus   . Depression   . Lumbosacral spondylosis without myelopathy   . Allergy   . Anxiety   . Cancer     cancerous nodules on thyroid  . Cancer     basal cell Cancer-nose  . COPD (chronic obstructive pulmonary disease)   . GERD (gastroesophageal reflux disease)   . Hyperlipidemia   . Thyroid disease     hypothyroid   BP 133/71  Pulse 109  Ht 5\' 9"  (1.753 m)  Wt 251 lb (113.853 kg)  BMI 37.07 kg/m2  SpO2 94%     Review of Systems  Constitutional: Positive for diaphoresis and unexpected weight change.  Gastrointestinal: Positive for nausea and vomiting.  Musculoskeletal: Positive for back pain.  All other systems reviewed and are negative.       Objective:   Physical Exam  Constitutional: She is oriented to person, place, and time. She appears well-developed.  HENT:  Head: Normocephalic.  Eyes: Pupils are equal, round, and reactive to light.  Neck: Normal range of motion.  Neurological: She is alert and oriented to person, place, and time.  Skin: Skin is warm and dry.  Psychiatric: She has a normal mood and affect.  Symmetric normal motor tone is noted throughout. Normal muscle bulk. Muscle testing reveals 5/5 muscle strength of the upper extremity, and 5/5 of the lower extremity, except left quadriceps 4/5. Full range of motion in upper and lower extremities, except left knee restricted in extension 10 degrees.. ROM of spine is restricted.  DTR in the upper and lower extremity are present and symmetric 1+. No clonus is noted.  Patient arises from chair with difficulty. Wide  based gait with cane and forward flexed spine. Left knee is mildly swollen, but not warm.         Assessment & Plan:  1. Chronic low back pain.  2. Facet arthropathy.  3. Degenerative disk disease.  4. Osteoarthritis of the knees.  5. Migraine headaches with aura  PLAN:  1. Refill MS Contin 100 mg 1 p.o. q.12 h., 60 with no refill.  She has been stable with these meds. Encouraged ongoing activity as tolerated, and continue exercise program for her knees and lower back. Also showed her some exercises for her neck.  2. Oxycodone 15 mg 1 p.o. q.6 h. p.r.n., 120 with no refills.

## 2011-07-11 ENCOUNTER — Telehealth: Payer: Self-pay | Admitting: Family Medicine

## 2011-07-11 NOTE — Telephone Encounter (Signed)
Call pt: Got a letter from her insurance co about her meds.   They rec EKG once a year to look for something called QT prolongation that can happen as a med SE from her psych meds.  We can do anytime that she would like.

## 2011-07-11 NOTE — Telephone Encounter (Signed)
Pt notified and pt states she will call back because her husband had back surgery and it s not a good time right now

## 2011-07-19 ENCOUNTER — Other Ambulatory Visit: Payer: Self-pay

## 2011-07-19 MED ORDER — CYCLOBENZAPRINE HCL 5 MG PO TABS: 5.0000 mg | ORAL_TABLET | Freq: Three times a day (TID) | ORAL | Status: DC | PRN

## 2011-07-31 ENCOUNTER — Encounter: Payer: Medicare Other | Attending: Physical Medicine & Rehabilitation | Admitting: Physical Medicine and Rehabilitation

## 2011-07-31 ENCOUNTER — Encounter: Payer: Self-pay | Admitting: Physical Medicine and Rehabilitation

## 2011-07-31 VITALS — BP 142/66 | HR 120 | Resp 16 | Ht 69.0 in | Wt 249.0 lb

## 2011-07-31 DIAGNOSIS — M1710 Unilateral primary osteoarthritis, unspecified knee: Secondary | ICD-10-CM | POA: Insufficient documentation

## 2011-07-31 DIAGNOSIS — Z96659 Presence of unspecified artificial knee joint: Secondary | ICD-10-CM | POA: Insufficient documentation

## 2011-07-31 DIAGNOSIS — G43109 Migraine with aura, not intractable, without status migrainosus: Secondary | ICD-10-CM | POA: Insufficient documentation

## 2011-07-31 DIAGNOSIS — M25561 Pain in right knee: Secondary | ICD-10-CM

## 2011-07-31 DIAGNOSIS — E039 Hypothyroidism, unspecified: Secondary | ICD-10-CM | POA: Insufficient documentation

## 2011-07-31 DIAGNOSIS — F17200 Nicotine dependence, unspecified, uncomplicated: Secondary | ICD-10-CM | POA: Insufficient documentation

## 2011-07-31 DIAGNOSIS — M129 Arthropathy, unspecified: Secondary | ICD-10-CM | POA: Insufficient documentation

## 2011-07-31 DIAGNOSIS — M545 Low back pain, unspecified: Secondary | ICD-10-CM | POA: Insufficient documentation

## 2011-07-31 DIAGNOSIS — G8929 Other chronic pain: Secondary | ICD-10-CM | POA: Insufficient documentation

## 2011-07-31 DIAGNOSIS — J449 Chronic obstructive pulmonary disease, unspecified: Secondary | ICD-10-CM | POA: Insufficient documentation

## 2011-07-31 DIAGNOSIS — E785 Hyperlipidemia, unspecified: Secondary | ICD-10-CM | POA: Insufficient documentation

## 2011-07-31 DIAGNOSIS — M25562 Pain in left knee: Secondary | ICD-10-CM

## 2011-07-31 DIAGNOSIS — K219 Gastro-esophageal reflux disease without esophagitis: Secondary | ICD-10-CM | POA: Insufficient documentation

## 2011-07-31 DIAGNOSIS — J4489 Other specified chronic obstructive pulmonary disease: Secondary | ICD-10-CM | POA: Insufficient documentation

## 2011-07-31 DIAGNOSIS — M25569 Pain in unspecified knee: Secondary | ICD-10-CM | POA: Insufficient documentation

## 2011-07-31 DIAGNOSIS — IMO0002 Reserved for concepts with insufficient information to code with codable children: Secondary | ICD-10-CM | POA: Insufficient documentation

## 2011-07-31 MED ORDER — OXYCODONE HCL 15 MG PO TABS: 15.0000 mg | ORAL_TABLET | Freq: Four times a day (QID) | ORAL | Status: DC | PRN

## 2011-07-31 MED ORDER — MORPHINE SULFATE ER 100 MG PO TBCR: 100.0000 mg | EXTENDED_RELEASE_TABLET | Freq: Two times a day (BID) | ORAL | Status: DC

## 2011-07-31 NOTE — Patient Instructions (Signed)
Look into water exercises at your [employer], silversneakers program, continue with medication, continue with exercising.

## 2011-07-31 NOTE — Progress Notes (Signed)
Subjective:    Patient ID: [name], female    DOB: 1960/07/23, 51 y.o.   MRN: [id number]  HPI The patient is a 51 year old female female, who presents withLBP and bilateral knee pain , worse on the left s/p TKR . The patient complains about moderate to severe pain in her low back, especially in her sacrum when sitting for a prolonged time. Patient denies any radiating symptoms. Patient reports, that her symptoms are stable, she has good and bad days. Applying heat/ ice, taking medications ,changing positions alleviate the symptoms. Prolonged sitting or standing aggrevates the symptoms. The patient grades his pain as a 7 /10. Patient is here for a refill med check.  Pain Inventory Average Pain 7 Pain Right Now 8 My pain is constant, sharp, burning, dull, stabbing, tingling and aching  In the last 24 hours, has pain interfered with the following? General activity 9 Relation with others 9 Enjoyment of life 9 What TIME of day is your pain at its worst? it vareis Sleep (in general) Poor  Pain is worse with: walking, bending, standing and some activites Pain improves with: rest, heat/ice, medication, TENS and injections Relief from Meds: 6  Mobility use a cane ability to climb steps?  yes do you drive?  yes Do you have any goals in this area?  yes  Function disabled: date disabled 2007 I need assistance with the following:  bathing, meal prep, household duties and shopping Do you have any goals in this area?  yes  Neuro/Psych bladder control problems weakness tremor tingling trouble walking spasms dizziness confusion depression anxiety suicidal thoughts-no plan nothing new.  Prior Studies Any changes since last visit?  no  Physicians involved in your care Any changes since last visit?  no   Family History  Problem Relation Age of Onset  . Depression Mother   . Hypertension Mother   . Hypertension Father   . Heart failure Father   . Diabetes Father   .  Heart attack Father   . Colon cancer Neg Hx   . Esophageal cancer Neg Hx   . Stomach cancer Neg Hx   . Rectal cancer Neg Hx    History   Social History  . Marital Status: Married    Spouse Name: Sullivan Lone    Number of Children: 1  . Years of Education: some colle   Occupational History  . On disability    Social History Main Topics  . Smoking status: Current Everyday Smoker -- 0.5 packs/day for 39 years    Types: Cigarettes  . Smokeless tobacco: Never Used   Comment: going to try e-sticks to quit  . Alcohol Use: No  . Drug Use: No  . Sexually Active: Not Currently -- Female partner(s)   Other Topics Concern  . None   Social History Narrative   8-10 cups of soda a day.  On disability. No regular exercise.     Past Surgical History  Procedure Date  . Tonsillectomy 09/1972  . Ulnar nerve entrapment Feb 1995    left elbow  . Carpal tunnel release 07/1993    both hands  . Tubal ligation Sept 1999  . Hemorroidectomy July 2003  . Knee arthroplasty 09/2001, 05/2003    left knee, chondromalasia  . Abdominal hysterectomy May 2004  . Replacement total knee Nov 2005    Left   . Thyroid lobectomy 01/2005    malignant area removed from right lobe  . Basel cell carcinoma 06/2005, 07/2005  nasal tip and reconstruction  . Knee arthroscopy jan 2005    Right knee, tisse release, chrondromalacia  . Colonoscopy   . Thyroid surgery     thyroid lobe removal   Past Medical History  Diagnosis Date  . Chronic pain syndrome   . Intervertebral lumbar disc disorder with myelopathy, lumbar region   . Facet syndrome, lumbar   . Primary localized osteoarthrosis, lower leg   . Degeneration of lumbar or lumbosacral intervertebral disc   . Unspecified musculoskeletal disorders and symptoms referable to neck     cervical/trapezius  . Migraine without aura, with intractable migraine, so stated, without mention of status migrainosus   . Depression   . Lumbosacral spondylosis without myelopathy     . Allergy   . Anxiety   . Cancer     cancerous nodules on thyroid  . Cancer     basal cell Cancer-nose  . COPD (chronic obstructive pulmonary disease)   . GERD (gastroesophageal reflux disease)   . Hyperlipidemia   . Thyroid disease     hypothyroid   BP 142/66  Pulse 120  Resp 16  Ht 5\' 9"  (1.753 m)  Wt 249 lb (112.946 kg)  BMI 36.77 kg/m2  SpO2 94%     Review of Systems     Objective:   Physical Exam Constitutional: She is oriented to person, place, and time. She appears well-developed.  HENT:  Head: Normocephalic.  Eyes: Pupils are equal, round, and reactive to light.  Neck: Normal range of motion.  Neurological: She is alert and oriented to person, place, and time.  Skin: Skin is warm and dry.  Psychiatric: She has a normal mood and affect.  Symmetric normal motor tone is noted throughout. Normal muscle bulk. Muscle testing reveals 5/5 muscle strength of the upper extremity, and 5/5 of the lower extremity, except left quadriceps 4/5. Full range of motion in upper and lower extremities, except left knee restricted in extension 10 degrees.. ROM of spine is restricted.  DTR in the upper and lower extremity are present and symmetric 1+. No clonus is noted.  Patient arises from chair with difficulty. Wide based gait with cane and forward flexed spine. Left knee is mildly swollen, but not warm.         Assessment & Plan:  1. Chronic low back pain.  2. Facet arthropathy.  3. Degenerative disk disease.  4. Osteoarthritis of the knees.  5. Migraine headaches with aura  PLAN:  1. Refill MS Contin 100 mg 1 p.o. q.12 h., 60 with no refill. She has been stable with these meds. Encouraged ongoing activity as tolerated, and continue exercise program for her knees and lower back. Also showed her some exercises for her neck. Encouraged patient to do water exercises or walking in the water, she reports that her insurance with even pay for this when she goes to a gym were they  have the silver sneaker program. 2. Oxycodone 15 mg 1 p.o. q.6 h. p.r.n., 120 with no refills. Followup in a month

## 2011-08-02 ENCOUNTER — Other Ambulatory Visit: Payer: Self-pay | Admitting: *Deleted

## 2011-08-02 MED ORDER — LEVOTHYROXINE SODIUM 25 MCG PO TABS: 25.0000 ug | ORAL_TABLET | Freq: Every day | ORAL | Status: DC

## 2011-08-02 MED ORDER — PRAVASTATIN SODIUM 40 MG PO TABS: 40.0000 mg | ORAL_TABLET | Freq: Every day | ORAL | Status: DC

## 2011-08-22 ENCOUNTER — Other Ambulatory Visit: Payer: Self-pay | Admitting: *Deleted

## 2011-08-22 MED ORDER — CITALOPRAM HYDROBROMIDE 40 MG PO TABS: 40.0000 mg | ORAL_TABLET | Freq: Every day | ORAL | Status: DC

## 2011-08-22 MED ORDER — CYCLOBENZAPRINE HCL 5 MG PO TABS: 5.0000 mg | ORAL_TABLET | Freq: Three times a day (TID) | ORAL | Status: DC | PRN

## 2011-08-22 MED ORDER — NORTRIPTYLINE HCL 25 MG PO CAPS: 25.0000 mg | ORAL_CAPSULE | Freq: Every day | ORAL | Status: DC

## 2011-08-28 ENCOUNTER — Encounter: Payer: Medicare Other | Attending: Physical Medicine & Rehabilitation | Admitting: Physical Medicine & Rehabilitation

## 2011-08-28 ENCOUNTER — Encounter: Payer: Self-pay | Admitting: Physical Medicine & Rehabilitation

## 2011-08-28 VITALS — BP 137/72 | HR 129 | Resp 16 | Ht 69.0 in | Wt 251.0 lb

## 2011-08-28 DIAGNOSIS — F32A Depression, unspecified: Secondary | ICD-10-CM

## 2011-08-28 DIAGNOSIS — G43109 Migraine with aura, not intractable, without status migrainosus: Secondary | ICD-10-CM

## 2011-08-28 DIAGNOSIS — M51369 Other intervertebral disc degeneration, lumbar region without mention of lumbar back pain or lower extremity pain: Secondary | ICD-10-CM

## 2011-08-28 DIAGNOSIS — M1712 Unilateral primary osteoarthritis, left knee: Secondary | ICD-10-CM

## 2011-08-28 DIAGNOSIS — M1710 Unilateral primary osteoarthritis, unspecified knee: Secondary | ICD-10-CM

## 2011-08-28 DIAGNOSIS — M47817 Spondylosis without myelopathy or radiculopathy, lumbosacral region: Secondary | ICD-10-CM

## 2011-08-28 DIAGNOSIS — M5137 Other intervertebral disc degeneration, lumbosacral region: Secondary | ICD-10-CM

## 2011-08-28 DIAGNOSIS — M47816 Spondylosis without myelopathy or radiculopathy, lumbar region: Secondary | ICD-10-CM

## 2011-08-28 DIAGNOSIS — M5136 Other intervertebral disc degeneration, lumbar region: Secondary | ICD-10-CM

## 2011-08-28 DIAGNOSIS — F3289 Other specified depressive episodes: Secondary | ICD-10-CM | POA: Insufficient documentation

## 2011-08-28 DIAGNOSIS — IMO0002 Reserved for concepts with insufficient information to code with codable children: Secondary | ICD-10-CM | POA: Insufficient documentation

## 2011-08-28 DIAGNOSIS — M51379 Other intervertebral disc degeneration, lumbosacral region without mention of lumbar back pain or lower extremity pain: Secondary | ICD-10-CM | POA: Insufficient documentation

## 2011-08-28 DIAGNOSIS — F329 Major depressive disorder, single episode, unspecified: Secondary | ICD-10-CM

## 2011-08-28 MED ORDER — OXYCODONE HCL 15 MG PO TABS: 15.0000 mg | ORAL_TABLET | Freq: Four times a day (QID) | ORAL | Status: DC | PRN

## 2011-08-28 MED ORDER — NORTRIPTYLINE HCL 25 MG PO CAPS: 25.0000 mg | ORAL_CAPSULE | Freq: Every day | ORAL | Status: DC

## 2011-08-28 MED ORDER — CITALOPRAM HYDROBROMIDE 40 MG PO TABS: 40.0000 mg | ORAL_TABLET | Freq: Every day | ORAL | Status: DC

## 2011-08-28 MED ORDER — MORPHINE SULFATE ER 100 MG PO TBCR: 100.0000 mg | EXTENDED_RELEASE_TABLET | Freq: Two times a day (BID) | ORAL | Status: DC

## 2011-08-28 MED ORDER — CYCLOBENZAPRINE HCL 5 MG PO TABS: 5.0000 mg | ORAL_TABLET | Freq: Three times a day (TID) | ORAL | Status: DC | PRN

## 2011-08-28 MED ORDER — GABAPENTIN 600 MG PO TABS: 600.0000 mg | ORAL_TABLET | Freq: Four times a day (QID) | ORAL | Status: DC

## 2011-08-28 NOTE — Progress Notes (Signed)
Subjective:    Patient ID: [name], female    DOB: 1960-10-26, 51 y.o.   MRN: [id number]  HPI  Adonna Horsley, is back regarding her chronic low back and knee pain. She had a rough month or so as her favorite dog passed away from [employer]. She also fell twice at the beginning of the month when she got up from her recliner. She attributes the falls to her headache/vertigo symptoms.   From a knee standpoint, her knees have been fairly stable. She is limited in her every day mobility still due to pain and swelling. She is able to walk short distances within the house. She can't make it to her mailbox due to pain and fatigue.   Her back flares up when she stands longer periods of time. The left low back area seems to flare up first.   She is interested in a powered mobility device such as a scooter to help her move occasionally at home and to get out into the community as well.  She feels confined to her house due to her pain.   Pain Inventory Average Pain 8 Pain Right Now 7 My pain is constant, sharp, burning, dull, stabbing, tingling and aching  In the last 24 hours, has pain interfered with the following? General activity 9 Relation with others 9 Enjoyment of life 9 What TIME of day is your pain at its worst? varies Sleep (in general) Poor  Pain is worse with: walking, bending, standing and some activites Pain improves with: rest, heat/ice, therapy/exercise, medication, TENS and injections Relief from Meds: 6  Mobility use a cane how many minutes can you walk? less than 5  ability to climb steps?  yes do you drive?  yes Do you have any goals in this area?  yes  Function disabled: date disabled 2007 I need assistance with the following:  bathing, meal prep, household duties and shopping Do you have any goals in this area?  yes  Neuro/Psych bladder control problems weakness tremor tingling trouble  walking spasms dizziness confusion depression anxiety  Prior Studies Any changes since last visit?  no  Physicians involved in your care Any changes since last visit?  no   Family History  Problem Relation Age of Onset  . Depression Mother   . Hypertension Mother   . Hypertension Father   . Heart failure Father   . Diabetes Father   . Heart attack Father   . Colon cancer Neg Hx   . Esophageal cancer Neg Hx   . Stomach cancer Neg Hx   . Rectal cancer Neg Hx    History   Social History  . Marital Status: Married    Spouse Name: Sullivan Lone    Number of Children: 1  . Years of Education: some colle   Occupational History  . On disability    Social History Main Topics  . Smoking status: Current Everyday Smoker -- 0.5 packs/day for 39 years    Types: Cigarettes  . Smokeless tobacco: Never Used   Comment: going to try e-sticks to quit  . Alcohol Use: No  . Drug Use: No  . Sexually Active: Not Currently -- Female partner(s)   Other Topics Concern  . None   Social History Narrative   8-10 cups of soda a day.  On disability. No regular exercise.     Past Surgical History  Procedure Date  . Tonsillectomy 09/1972  . Ulnar nerve entrapment Feb 1995    left elbow  .  Carpal tunnel release 07/1993    both hands  . Tubal ligation Sept 1999  . Hemorroidectomy July 2003  . Knee arthroplasty 09/2001, 05/2003    left knee, chondromalasia  . Abdominal hysterectomy May 2004  . Replacement total knee Nov 2005    Left   . Thyroid lobectomy 01/2005    malignant area removed from right lobe  . Basel cell carcinoma 06/2005, 07/2005    nasal tip and reconstruction  . Knee arthroscopy jan 2005    Right knee, tisse release, chrondromalacia  . Colonoscopy   . Thyroid surgery     thyroid lobe removal   Past Medical History  Diagnosis Date  . Chronic pain syndrome   . Intervertebral lumbar disc disorder with myelopathy, lumbar region   . Facet syndrome, lumbar   . Primary  localized osteoarthrosis, lower leg   . Degeneration of lumbar or lumbosacral intervertebral disc   . Unspecified musculoskeletal disorders and symptoms referable to neck     cervical/trapezius  . Migraine without aura, with intractable migraine, so stated, without mention of status migrainosus   . Depression   . Lumbosacral spondylosis without myelopathy   . Allergy   . Anxiety   . Cancer     cancerous nodules on thyroid  . Cancer     basal cell Cancer-nose  . COPD (chronic obstructive pulmonary disease)   . GERD (gastroesophageal reflux disease)   . Hyperlipidemia   . Thyroid disease     hypothyroid   BP 137/72  Pulse 129  Resp 16  Ht 5\' 9"  (1.753 m)  Wt 251 lb (113.853 kg)  BMI 37.07 kg/m2  SpO2 95%     Review of Systems  Constitutional: Positive for diaphoresis and unexpected weight change.  Gastrointestinal: Positive for nausea, vomiting and constipation.  Musculoskeletal: Positive for myalgias, back pain, arthralgias and gait problem.  Neurological: Positive for weakness and numbness.  Psychiatric/Behavioral: Positive for suicidal ideas and dysphoric mood. The patient is nervous/anxious.   All other systems reviewed and are negative.       Objective:   Physical Exam Constitutional: She is oriented to person, place, and time. She appears well-developed and well-nourished.  overweight  HENT:  Head: Normocephalic.  Eyes: Conjunctivae and EOM are normal. Pupils are equal, round, and reactive to light.  Neck: Normal range of motion.  Cardiovascular: Normal rate and regular rhythm.  Pulmonary/Chest: Effort normal and breath sounds normal.  Abdominal: Soft.  Musculoskeletal: Normal range of motion.  Left knee with warmth and swelling with pain with resisted flex and ext. She is wearing her OA knee  Brace. She can flex and touch her toes with pain. She has pain with extension past 20 degrees. She has some discomfort with extension from seated position  She  ambulates using a straight cane to off load the left knee. Significant pain with WB on the left is noted.   Neurological: She is alert and oriented to person, place, and time. She has normal strength. No cranial nerve deficit or sensory deficit.  Skin: Skin is warm.  Psychiatric: She has a normal mood and affect. Her behavior is normal. Judgment and thought content normal.  Assessment & Plan:   ASSESSMENT:  1. Chronic low back pain.  2. Facet arthropathy.  3. Degenerative disk disease.  4. Osteoarthritis of the knees.  5. Migraine headaches with aura   PLAN:  1. Refill MS Contin 100 mg 1 p.o. q.12 h., 60 with no refill. I gave her second  scripts for next month. She has been stable with these meds. Encouraged ongoing activity as tolerated.  2. Oxycodone 15 mg 1 p.o. q.6 h. p.r.n., 120 with no refill with a script for next month.  3. I think she is appropriate for a powered scooter to allow her safer mobility around her home, to allow her to get out in the community, and to help minimize the pain from her knee and back issues.   4. Her questions were encouraged and answered. She will follow up in a  Month. I did refill her neurontin, pamelor, flexeril, and celexa today

## 2011-08-28 NOTE — Patient Instructions (Signed)
Call me with questions 

## 2011-09-04 ENCOUNTER — Telehealth: Payer: Self-pay | Admitting: Physical Medicine & Rehabilitation

## 2011-09-04 NOTE — Telephone Encounter (Signed)
[facility] called and said she had appointment on 09/04/11. Does not have another appointment until 10/02/11. (Not quite sure of the purpose of this message other than we might be getting a fax from [facility]).

## 2011-09-07 ENCOUNTER — Telehealth: Payer: Self-pay | Admitting: Physical Medicine & Rehabilitation

## 2011-09-07 NOTE — Telephone Encounter (Signed)
I contacted [facility] and told them that the pt has to be seen to get this paperwork done per [facility]. She has an appointment on 10/02/11.

## 2011-09-07 NOTE — Telephone Encounter (Signed)
F/u on paperwork faxed 08/23/11.  Needs Rx and note from 09/04/11. ASAP.

## 2011-10-02 ENCOUNTER — Encounter
Payer: Medicare Other | Attending: Physical Medicine and Rehabilitation | Admitting: Physical Medicine and Rehabilitation

## 2011-10-02 ENCOUNTER — Encounter: Payer: Self-pay | Admitting: Physical Medicine and Rehabilitation

## 2011-10-02 VITALS — BP 118/62 | HR 102 | Resp 14 | Ht 69.0 in | Wt 254.0 lb

## 2011-10-02 DIAGNOSIS — J449 Chronic obstructive pulmonary disease, unspecified: Secondary | ICD-10-CM | POA: Insufficient documentation

## 2011-10-02 DIAGNOSIS — F32A Depression, unspecified: Secondary | ICD-10-CM

## 2011-10-02 DIAGNOSIS — M1710 Unilateral primary osteoarthritis, unspecified knee: Secondary | ICD-10-CM | POA: Insufficient documentation

## 2011-10-02 DIAGNOSIS — E039 Hypothyroidism, unspecified: Secondary | ICD-10-CM | POA: Insufficient documentation

## 2011-10-02 DIAGNOSIS — M1712 Unilateral primary osteoarthritis, left knee: Secondary | ICD-10-CM

## 2011-10-02 DIAGNOSIS — Z85828 Personal history of other malignant neoplasm of skin: Secondary | ICD-10-CM | POA: Insufficient documentation

## 2011-10-02 DIAGNOSIS — M545 Low back pain, unspecified: Secondary | ICD-10-CM

## 2011-10-02 DIAGNOSIS — Z85850 Personal history of malignant neoplasm of thyroid: Secondary | ICD-10-CM | POA: Insufficient documentation

## 2011-10-02 DIAGNOSIS — F329 Major depressive disorder, single episode, unspecified: Secondary | ICD-10-CM

## 2011-10-02 DIAGNOSIS — M129 Arthropathy, unspecified: Secondary | ICD-10-CM | POA: Insufficient documentation

## 2011-10-02 DIAGNOSIS — M47817 Spondylosis without myelopathy or radiculopathy, lumbosacral region: Secondary | ICD-10-CM

## 2011-10-02 DIAGNOSIS — E785 Hyperlipidemia, unspecified: Secondary | ICD-10-CM | POA: Insufficient documentation

## 2011-10-02 DIAGNOSIS — Z96659 Presence of unspecified artificial knee joint: Secondary | ICD-10-CM | POA: Insufficient documentation

## 2011-10-02 DIAGNOSIS — IMO0002 Reserved for concepts with insufficient information to code with codable children: Secondary | ICD-10-CM

## 2011-10-02 DIAGNOSIS — G43109 Migraine with aura, not intractable, without status migrainosus: Secondary | ICD-10-CM

## 2011-10-02 DIAGNOSIS — M5136 Other intervertebral disc degeneration, lumbar region: Secondary | ICD-10-CM

## 2011-10-02 DIAGNOSIS — K219 Gastro-esophageal reflux disease without esophagitis: Secondary | ICD-10-CM | POA: Insufficient documentation

## 2011-10-02 DIAGNOSIS — F17200 Nicotine dependence, unspecified, uncomplicated: Secondary | ICD-10-CM | POA: Insufficient documentation

## 2011-10-02 DIAGNOSIS — M25569 Pain in unspecified knee: Secondary | ICD-10-CM | POA: Insufficient documentation

## 2011-10-02 DIAGNOSIS — J4489 Other specified chronic obstructive pulmonary disease: Secondary | ICD-10-CM | POA: Insufficient documentation

## 2011-10-02 DIAGNOSIS — G8929 Other chronic pain: Secondary | ICD-10-CM

## 2011-10-02 DIAGNOSIS — M47816 Spondylosis without myelopathy or radiculopathy, lumbar region: Secondary | ICD-10-CM

## 2011-10-02 DIAGNOSIS — M5137 Other intervertebral disc degeneration, lumbosacral region: Secondary | ICD-10-CM

## 2011-10-02 MED ORDER — MORPHINE SULFATE ER 100 MG PO TBCR: 100.0000 mg | EXTENDED_RELEASE_TABLET | Freq: Two times a day (BID) | ORAL | Status: DC

## 2011-10-02 MED ORDER — OXYCODONE HCL 15 MG PO TABS: 15.0000 mg | ORAL_TABLET | Freq: Four times a day (QID) | ORAL | Status: DC | PRN

## 2011-10-02 NOTE — Progress Notes (Signed)
Subjective:    Patient ID: [name], female    DOB: 1960-10-11, 51 y.o.   MRN: [id number]  HPI The patient is a 51 year old female female, who presents withLBP and bilateral knee pain , worse on the left s/p TKR . The patient complains about moderate to severe pain in her low back, especially in her sacrum when sitting for a prolonged time. Patient denies any radiating symptoms. Patient reports, that her symptoms are stable, she has good and bad days. Applying heat/ ice, taking medications ,changing positions alleviate the symptoms. Prolonged sitting or standing aggrevates the symptoms. The patient grades his pain as a 7 /10. Patient is here for a refill med check. The patient reports, that her insurance has denied her a scooter.  Pain Inventory Average Pain 8 Pain Right Now 7 My pain is constant, sharp, burning, dull, stabbing, tingling and aching  In the last 24 hours, has pain interfered with the following? General activity 9 Relation with others 9 Enjoyment of life 9 What TIME of day is your pain at its worst? varies Sleep (in general) Poor  Pain is worse with: walking, bending, standing and some activites Pain improves with: rest, heat/ice, therapy/exercise, medication, TENS and injections Relief from Meds: 6  Mobility use a cane how many minutes can you walk? 5 ability to climb steps?  yes do you drive?  yes Do you have any goals in this area?  yes  Function not employed: date last employed 1/05 disabled: date disabled 07/07 I need assistance with the following:  bathing, meal prep, household duties and shopping Do you have any goals in this area?  yes  Neuro/Psych bladder control problems weakness numbness tremor tingling trouble walking spasms dizziness confusion depression anxiety suicidal thoughts  Prior Studies Any changes since last visit?  no  Physicians involved in your care Any changes since last visit?  no   Family History  Problem  Relation Age of Onset  . Depression Mother   . Hypertension Mother   . Hypertension Father   . Heart failure Father   . Diabetes Father   . Heart attack Father   . Colon cancer Neg Hx   . Esophageal cancer Neg Hx   . Stomach cancer Neg Hx   . Rectal cancer Neg Hx    History   Social History  . Marital Status: Married    Spouse Name: Sullivan Lone    Number of Children: 1  . Years of Education: some colle   Occupational History  . On disability    Social History Main Topics  . Smoking status: Current Everyday Smoker -- 0.5 packs/day for 39 years    Types: Cigarettes  . Smokeless tobacco: Never Used   Comment: going to try e-sticks to quit  . Alcohol Use: No  . Drug Use: No  . Sexually Active: Not Currently -- Female partner(s)   Other Topics Concern  . None   Social History Narrative   8-10 cups of soda a day.  On disability. No regular exercise.     Past Surgical History  Procedure Date  . Tonsillectomy 09/1972  . Ulnar nerve entrapment Feb 1995    left elbow  . Carpal tunnel release 07/1993    both hands  . Tubal ligation Sept 1999  . Hemorroidectomy July 2003  . Knee arthroplasty 09/2001, 05/2003    left knee, chondromalasia  . Abdominal hysterectomy May 2004  . Replacement total knee Nov 2005    Left   .  Thyroid lobectomy 01/2005    malignant area removed from right lobe  . Basel cell carcinoma 06/2005, 07/2005    nasal tip and reconstruction  . Knee arthroscopy jan 2005    Right knee, tisse release, chrondromalacia  . Colonoscopy   . Thyroid surgery     thyroid lobe removal   Past Medical History  Diagnosis Date  . Chronic pain syndrome   . Intervertebral lumbar disc disorder with myelopathy, lumbar region   . Facet syndrome, lumbar   . Primary localized osteoarthrosis, lower leg   . Degeneration of lumbar or lumbosacral intervertebral disc   . Unspecified musculoskeletal disorders and symptoms referable to neck     cervical/trapezius  . Migraine without  aura, with intractable migraine, so stated, without mention of status migrainosus   . Depression   . Lumbosacral spondylosis without myelopathy   . Allergy   . Anxiety   . Cancer     cancerous nodules on thyroid  . Cancer     basal cell Cancer-nose  . COPD (chronic obstructive pulmonary disease)   . GERD (gastroesophageal reflux disease)   . Hyperlipidemia   . Thyroid disease     hypothyroid   BP 118/62  Pulse 102  Resp 14  Ht 5\' 9"  (1.753 m)  Wt 254 lb (115.214 kg)  BMI 37.51 kg/m2  SpO2 94%    Review of Systems  Constitutional: Positive for appetite change and unexpected weight change.  Gastrointestinal: Positive for constipation.  Musculoskeletal: Positive for myalgias, back pain, arthralgias and gait problem.  Neurological: Positive for dizziness, weakness and numbness.  Psychiatric/Behavioral: Positive for suicidal ideas, confusion and dysphoric mood. The patient is nervous/anxious.   All other systems reviewed and are negative.       Objective:   Physical Exam Constitutional: She is oriented to person, place, and time. She appears well-developed.  HENT:  Head: Normocephalic.  Eyes: Pupils are equal, round, and reactive to light.  Neck: Normal range of motion.  Neurological: She is alert and oriented to person, place, and time.  Skin: Skin is warm and dry.  Psychiatric: She has a normal mood and affect.  Symmetric normal motor tone is noted throughout. Normal muscle bulk. Muscle testing reveals 5/5 muscle strength of the upper extremity, and 5/5 of the lower extremity, except left quadriceps 4/5. Full range of motion in upper and lower extremities, except left knee restricted in extension 10 degrees.. ROM of spine is restricted.  DTR in the upper and lower extremity are present and symmetric 1+. No clonus is noted.  Patient arises from chair with difficulty. Wide based gait with cane and forward flexed spine. Left knee is mildly swollen, but not warm.           Assessment & Plan:  1. Chronic low back pain.  2. Facet arthropathy.  3. Degenerative disk disease.  4. Osteoarthritis of the knees.  5. Migraine headaches with aura  PLAN:  1. Refill MS Contin 100 mg 1 p.o. q.12 h., 60 with no refill. She has been stable with these meds. Encouraged ongoing activity as tolerated, and continue exercise program for her knees and lower back. Also showed her some exercises for her neck. Encouraged patient to do water exercises or walking in the water, she reports that her insurance will even pay for this when she goes to a gym were they have the silver sneaker program. Encouraged patient to reapply for a scooter, because I think it would be very beneficial for her,  to get to her gym, and to get around safely. 2. Oxycodone 15 mg 1 p.o. q.6 h. p.r.n., 120 with no refills.  Follow up in one month. Followup in a month

## 2011-10-02 NOTE — Patient Instructions (Signed)
Advised patient to start water aerobics, which would be very beneficial.

## 2011-10-25 ENCOUNTER — Ambulatory Visit: Payer: Medicare Other | Admitting: Family Medicine

## 2011-10-30 ENCOUNTER — Encounter
Payer: Medicare Other | Attending: Physical Medicine and Rehabilitation | Admitting: Physical Medicine and Rehabilitation

## 2011-10-30 ENCOUNTER — Encounter: Payer: Self-pay | Admitting: Physical Medicine and Rehabilitation

## 2011-10-30 VITALS — BP 145/78 | HR 128 | Resp 16 | Ht 69.0 in | Wt 251.0 lb

## 2011-10-30 DIAGNOSIS — M47816 Spondylosis without myelopathy or radiculopathy, lumbar region: Secondary | ICD-10-CM

## 2011-10-30 DIAGNOSIS — F329 Major depressive disorder, single episode, unspecified: Secondary | ICD-10-CM

## 2011-10-30 DIAGNOSIS — G43109 Migraine with aura, not intractable, without status migrainosus: Secondary | ICD-10-CM

## 2011-10-30 DIAGNOSIS — M1710 Unilateral primary osteoarthritis, unspecified knee: Secondary | ICD-10-CM | POA: Insufficient documentation

## 2011-10-30 DIAGNOSIS — M47817 Spondylosis without myelopathy or radiculopathy, lumbosacral region: Secondary | ICD-10-CM

## 2011-10-30 DIAGNOSIS — M5137 Other intervertebral disc degeneration, lumbosacral region: Secondary | ICD-10-CM

## 2011-10-30 DIAGNOSIS — M1712 Unilateral primary osteoarthritis, left knee: Secondary | ICD-10-CM

## 2011-10-30 DIAGNOSIS — M545 Low back pain, unspecified: Secondary | ICD-10-CM

## 2011-10-30 DIAGNOSIS — IMO0002 Reserved for concepts with insufficient information to code with codable children: Secondary | ICD-10-CM | POA: Insufficient documentation

## 2011-10-30 DIAGNOSIS — G8929 Other chronic pain: Secondary | ICD-10-CM | POA: Insufficient documentation

## 2011-10-30 DIAGNOSIS — M5136 Other intervertebral disc degeneration, lumbar region: Secondary | ICD-10-CM

## 2011-10-30 DIAGNOSIS — F32A Depression, unspecified: Secondary | ICD-10-CM

## 2011-10-30 DIAGNOSIS — M129 Arthropathy, unspecified: Secondary | ICD-10-CM | POA: Insufficient documentation

## 2011-10-30 MED ORDER — MORPHINE SULFATE ER 100 MG PO TBCR: 100.0000 mg | EXTENDED_RELEASE_TABLET | Freq: Two times a day (BID) | ORAL | Status: DC

## 2011-10-30 MED ORDER — OXYCODONE HCL 15 MG PO TABS: 15.0000 mg | ORAL_TABLET | Freq: Four times a day (QID) | ORAL | Status: DC | PRN

## 2011-10-30 NOTE — Patient Instructions (Signed)
Start with aquatic exercising.

## 2011-10-30 NOTE — Progress Notes (Signed)
Subjective:    Patient ID: [name], female    DOB: January 13, 1961, 51 y.o.   MRN: [id number]  HPI The patient is a 51 year old female female, who presents withLBP and bilateral knee pain , worse on the left s/p TKR . The patient complains about moderate to severe pain in her low back, especially in her sacrum when sitting for a prolonged time. Patient denies any radiating symptoms. Patient reports, that her symptoms are stable, she has good and bad days. Applying heat/ ice, taking medications ,changing positions alleviate the symptoms. Prolonged sitting or standing aggrevates the symptoms. The patient grades his pain as a 7 /10.The patient states, that her insurance is paying for a membership in a gym, and that she wants to start water exercises. Patient is here for a refill med check.  Pain Inventory Average Pain 8 Pain Right Now 8 My pain is constant, sharp, burning, dull, stabbing, tingling and aching  In the last 24 hours, has pain interfered with the following? General activity 9 Relation with others 9 Enjoyment of life 9 What TIME of day is your pain at its worst? varies Sleep (in general) Poor  Pain is worse with: walking, bending, sitting, standing and some activites Pain improves with: rest, heat/ice, therapy/exercise, pacing activities, medication, TENS and injections Relief from Meds: 6  Mobility use a cane how many minutes can you walk? 5 ability to climb steps?  yes do you drive?  yes Do you have any goals in this area?  yes  Function not employed: date last employed 1/05 disabled: date disabled 07/07  Neuro/Psych bladder control problems weakness numbness tremor tingling trouble walking spasms dizziness confusion depression anxiety suicidal thoughts  Prior Studies Any changes since last visit?  no  Physicians involved in your care Any changes since last visit?  no   Family History  Problem Relation Age of Onset  . Depression Mother   .  Hypertension Mother   . Hypertension Father   . Heart failure Father   . Diabetes Father   . Heart attack Father   . Colon cancer Neg Hx   . Esophageal cancer Neg Hx   . Stomach cancer Neg Hx   . Rectal cancer Neg Hx    History   Social History  . Marital Status: Married    Spouse Name: Sullivan Lone    Number of Children: 1  . Years of Education: some colle   Occupational History  . On disability    Social History Main Topics  . Smoking status: Current Every Day Smoker -- 0.5 packs/day for 39 years    Types: Cigarettes  . Smokeless tobacco: Never Used   Comment: going to try e-sticks to quit  . Alcohol Use: No  . Drug Use: No  . Sexually Active: Not Currently -- Female partner(s)   Other Topics Concern  . None   Social History Narrative   8-10 cups of soda a day.  On disability. No regular exercise.     Past Surgical History  Procedure Date  . Tonsillectomy 09/1972  . Ulnar nerve entrapment Feb 1995    left elbow  . Carpal tunnel release 07/1993    both hands  . Tubal ligation Sept 1999  . Hemorroidectomy July 2003  . Knee arthroplasty 09/2001, 05/2003    left knee, chondromalasia  . Abdominal hysterectomy May 2004  . Replacement total knee Nov 2005    Left   . Thyroid lobectomy 01/2005    malignant  area removed from right lobe  . Basel cell carcinoma 06/2005, 07/2005    nasal tip and reconstruction  . Knee arthroscopy jan 2005    Right knee, tisse release, chrondromalacia  . Colonoscopy   . Thyroid surgery     thyroid lobe removal   Past Medical History  Diagnosis Date  . Chronic pain syndrome   . Intervertebral lumbar disc disorder with myelopathy, lumbar region   . Facet syndrome, lumbar   . Primary localized osteoarthrosis, lower leg   . Degeneration of lumbar or lumbosacral intervertebral disc   . Unspecified musculoskeletal disorders and symptoms referable to neck     cervical/trapezius  . Migraine without aura, with intractable migraine, so stated,  without mention of status migrainosus   . Depression   . Lumbosacral spondylosis without myelopathy   . Allergy   . Anxiety   . Cancer     cancerous nodules on thyroid  . Cancer     basal cell Cancer-nose  . COPD (chronic obstructive pulmonary disease)   . GERD (gastroesophageal reflux disease)   . Hyperlipidemia   . Thyroid disease     hypothyroid   BP 145/78  Pulse 128  Resp 16  Ht 5\' 9"  (1.753 m)  Wt 251 lb (113.853 kg)  BMI 37.07 kg/m2  SpO2 92%     Review of Systems  Constitutional: Positive for diaphoresis and unexpected weight change.  Gastrointestinal: Positive for nausea, vomiting and constipation.  Musculoskeletal: Positive for myalgias, back pain, arthralgias and gait problem.  Neurological: Positive for dizziness, weakness and numbness.  Psychiatric/Behavioral: Positive for suicidal ideas, confusion and dysphoric mood. The patient is nervous/anxious.   All other systems reviewed and are negative.       Objective:   Physical Exam Constitutional: She is oriented to person, place, and time. She appears well-developed.  HENT:  Head: Normocephalic.  Eyes: Pupils are equal, round, and reactive to light.  Neck: Normal range of motion.  Neurological: She is alert and oriented to person, place, and time.  Skin: Skin is warm and dry.  Psychiatric: She has a normal mood and affect.  Symmetric normal motor tone is noted throughout. Normal muscle bulk. Muscle testing reveals 5/5 muscle strength of the upper extremity, and 5/5 of the lower extremity, except left quadriceps 4/5. Full range of motion in upper and lower extremities, except left knee restricted in extension 10 degrees.. ROM of spine is restricted.  DTR in the upper and lower extremity are present and symmetric 1+. No clonus is noted.  Patient arises from chair with difficulty. Wide based gait with cane and forward flexed spine. Left knee is mildly swollen, but not warm.         Assessment & Plan:  1.  Chronic low back pain.  2. Facet arthropathy.  3. Degenerative disk disease.  4. Osteoarthritis of the knees.  5. Migraine headaches with aura  PLAN:  1. Refill MS Contin 100 mg 1 p.o. q.12 h., 60 with no refill. She has been stable with these meds. Encouraged ongoing activity as tolerated, and continue exercise program for her knees and lower back. Also showed her some exercises for her neck. Encouraged patient to do water exercises or walking in the water, she reports that her insurance will even pay for this when she goes to a gym were they have the silver sneaker program.   2. Oxycodone 15 mg 1 p.o. q.6 h. p.r.n., 120 with no refills.  Follow up in one month.

## 2011-11-27 ENCOUNTER — Encounter: Payer: Self-pay | Admitting: Physical Medicine and Rehabilitation

## 2011-11-27 ENCOUNTER — Encounter
Payer: Medicare Other | Attending: Physical Medicine and Rehabilitation | Admitting: Physical Medicine and Rehabilitation

## 2011-11-27 VITALS — BP 139/79 | HR 119 | Resp 16 | Ht 69.0 in | Wt 252.0 lb

## 2011-11-27 DIAGNOSIS — M5136 Other intervertebral disc degeneration, lumbar region: Secondary | ICD-10-CM

## 2011-11-27 DIAGNOSIS — E785 Hyperlipidemia, unspecified: Secondary | ICD-10-CM | POA: Insufficient documentation

## 2011-11-27 DIAGNOSIS — J449 Chronic obstructive pulmonary disease, unspecified: Secondary | ICD-10-CM | POA: Insufficient documentation

## 2011-11-27 DIAGNOSIS — E039 Hypothyroidism, unspecified: Secondary | ICD-10-CM | POA: Insufficient documentation

## 2011-11-27 DIAGNOSIS — F17200 Nicotine dependence, unspecified, uncomplicated: Secondary | ICD-10-CM | POA: Insufficient documentation

## 2011-11-27 DIAGNOSIS — K219 Gastro-esophageal reflux disease without esophagitis: Secondary | ICD-10-CM | POA: Insufficient documentation

## 2011-11-27 DIAGNOSIS — M51379 Other intervertebral disc degeneration, lumbosacral region without mention of lumbar back pain or lower extremity pain: Secondary | ICD-10-CM | POA: Insufficient documentation

## 2011-11-27 DIAGNOSIS — F329 Major depressive disorder, single episode, unspecified: Secondary | ICD-10-CM

## 2011-11-27 DIAGNOSIS — M47817 Spondylosis without myelopathy or radiculopathy, lumbosacral region: Secondary | ICD-10-CM

## 2011-11-27 DIAGNOSIS — M47816 Spondylosis without myelopathy or radiculopathy, lumbar region: Secondary | ICD-10-CM

## 2011-11-27 DIAGNOSIS — G8929 Other chronic pain: Secondary | ICD-10-CM | POA: Insufficient documentation

## 2011-11-27 DIAGNOSIS — M545 Low back pain, unspecified: Secondary | ICD-10-CM | POA: Insufficient documentation

## 2011-11-27 DIAGNOSIS — M1712 Unilateral primary osteoarthritis, left knee: Secondary | ICD-10-CM

## 2011-11-27 DIAGNOSIS — M129 Arthropathy, unspecified: Secondary | ICD-10-CM | POA: Insufficient documentation

## 2011-11-27 DIAGNOSIS — G43109 Migraine with aura, not intractable, without status migrainosus: Secondary | ICD-10-CM

## 2011-11-27 DIAGNOSIS — F32A Depression, unspecified: Secondary | ICD-10-CM

## 2011-11-27 DIAGNOSIS — IMO0002 Reserved for concepts with insufficient information to code with codable children: Secondary | ICD-10-CM

## 2011-11-27 DIAGNOSIS — M1710 Unilateral primary osteoarthritis, unspecified knee: Secondary | ICD-10-CM | POA: Insufficient documentation

## 2011-11-27 DIAGNOSIS — J4489 Other specified chronic obstructive pulmonary disease: Secondary | ICD-10-CM | POA: Insufficient documentation

## 2011-11-27 DIAGNOSIS — M5137 Other intervertebral disc degeneration, lumbosacral region: Secondary | ICD-10-CM

## 2011-11-27 DIAGNOSIS — M25569 Pain in unspecified knee: Secondary | ICD-10-CM | POA: Insufficient documentation

## 2011-11-27 MED ORDER — OXYCODONE HCL 15 MG PO TABS: 15.0000 mg | ORAL_TABLET | Freq: Four times a day (QID) | ORAL | Status: DC | PRN

## 2011-11-27 MED ORDER — MORPHINE SULFATE ER 100 MG PO TBCR: 100.0000 mg | EXTENDED_RELEASE_TABLET | Freq: Two times a day (BID) | ORAL | Status: DC

## 2011-11-27 NOTE — Patient Instructions (Signed)
[facility]

## 2011-11-27 NOTE — Progress Notes (Signed)
Subjective:    Patient ID: [name], female    DOB: April 02, 1960, 51 y.o.   MRN: [id number]  HPI The patient is a 51 year old female female, who presents withLBP and bilateral knee pain , worse on the left s/p TKR . The patient complains about moderate to severe pain in her low back, especially in her sacrum when sitting for a prolonged time. Patient denies any radiating symptoms. Patient reports, that her symptoms are stable, she has good and bad days. Applying heat/ ice, taking medications ,changing positions alleviate the symptoms. Prolonged sitting or standing aggrevates the symptoms. The patient grades his pain as a 7 /10.The patient states, that her insurance is paying for a membership in a gym, and that she wants to start water exercises. Patient is here for a refill med check.  Pain Inventory Average Pain 8 Pain Right Now 7 My pain is constant, sharp, burning, dull, stabbing, tingling and aching  In the last 24 hours, has pain interfered with the following? General activity 9 Relation with others 9 Enjoyment of life 9 What TIME of day is your pain at its worst? varies Sleep (in general) Poor  Pain is worse with: walking, bending, standing and some activites Pain improves with: rest, heat/ice, therapy/exercise, medication, TENS and injections Relief from Meds: 7  Mobility use a cane how many minutes can you walk? 5-10 ability to climb steps?  yes do you drive?  yes Do you have any goals in this area?  yes  Function not employed: date last employed 01/05 disabled: date disabled 07/07 I need assistance with the following:  bathing, meal prep, household duties and shopping Do you have any goals in this area?  yes  Neuro/Psych bladder control problems weakness numbness tremor tingling trouble walking spasms dizziness confusion depression anxiety suicidal thoughts  Prior Studies Any changes since last visit?  no  Physicians involved in your care Any  changes since last visit?  no   Family History  Problem Relation Age of Onset  . Depression Mother   . Hypertension Mother   . Hypertension Father   . Heart failure Father   . Diabetes Father   . Heart attack Father   . Colon cancer Neg Hx   . Esophageal cancer Neg Hx   . Stomach cancer Neg Hx   . Rectal cancer Neg Hx    History   Social History  . Marital Status: Married    Spouse Name: Sullivan Lone    Number of Children: 1  . Years of Education: some colle   Occupational History  . On disability    Social History Main Topics  . Smoking status: Current Every Day Smoker -- 0.5 packs/day for 39 years    Types: Cigarettes  . Smokeless tobacco: Never Used   Comment: going to try e-sticks to quit  . Alcohol Use: No  . Drug Use: No  . Sexually Active: Not Currently -- Female partner(s)   Other Topics Concern  . None   Social History Narrative   8-10 cups of soda a day.  On disability. No regular exercise.     Past Surgical History  Procedure Date  . Tonsillectomy 09/1972  . Ulnar nerve entrapment Feb 1995    left elbow  . Carpal tunnel release 07/1993    both hands  . Tubal ligation Sept 1999  . Hemorroidectomy July 2003  . Knee arthroplasty 09/2001, 05/2003    left knee, chondromalasia  . Abdominal hysterectomy May 2004  .  Replacement total knee Nov 2005    Left   . Thyroid lobectomy 01/2005    malignant area removed from right lobe  . Basel cell carcinoma 06/2005, 07/2005    nasal tip and reconstruction  . Knee arthroscopy jan 2005    Right knee, tisse release, chrondromalacia  . Colonoscopy   . Thyroid surgery     thyroid lobe removal   Past Medical History  Diagnosis Date  . Chronic pain syndrome   . Intervertebral lumbar disc disorder with myelopathy, lumbar region   . Facet syndrome, lumbar   . Primary localized osteoarthrosis, lower leg   . Degeneration of lumbar or lumbosacral intervertebral disc   . Unspecified musculoskeletal disorders and symptoms  referable to neck     cervical/trapezius  . Migraine without aura, with intractable migraine, so stated, without mention of status migrainosus   . Depression   . Lumbosacral spondylosis without myelopathy   . Allergy   . Anxiety   . Cancer     cancerous nodules on thyroid  . Cancer     basal cell Cancer-nose  . COPD (chronic obstructive pulmonary disease)   . GERD (gastroesophageal reflux disease)   . Hyperlipidemia   . Thyroid disease     hypothyroid   BP 139/79  Pulse 119  Resp 16  Ht 5\' 9"  (1.753 m)  Wt 252 lb (114.306 kg)  BMI 37.21 kg/m2  SpO2 95%     Review of Systems  Constitutional: Positive for diaphoresis.  Gastrointestinal: Positive for nausea, vomiting and constipation.  Neurological: Positive for dizziness, weakness and numbness.  Psychiatric/Behavioral: Positive for confusion and dysphoric mood. The patient is nervous/anxious.   All other systems reviewed and are negative.       Objective:   Physical Exam Physical Exam  Constitutional: She is oriented to person, place, and time. She appears well-developed.  HENT:  Head: Normocephalic.  Eyes: Pupils are equal, round, and reactive to light.  Neck: Normal range of motion.  Neurological: She is alert and oriented to person, place, and time.  Skin: Skin is warm and dry.  Psychiatric: She has a normal mood and affect.  Symmetric normal motor tone is noted throughout. Normal muscle bulk. Muscle testing reveals 5/5 muscle strength of the upper extremity, and 5/5 of the lower extremity, except left quadriceps 4/5. Full range of motion in upper and lower extremities, except left knee restricted in extension 10 degrees.. ROM of spine is restricted.  DTR in the upper and lower extremity are present and symmetric 1+. No clonus is noted.  Patient arises from chair with difficulty. Wide based gait with cane and forward flexed spine. Left knee is mildly swollen, but not warm.         Assessment & Plan:  1.  Chronic low back pain.  2. Facet arthropathy.  3. Degenerative disk disease.  4. Osteoarthritis of the knees.  5. Migraine headaches with aura  PLAN:  1. Refill MS Contin 100 mg 1 p.o. q.12 h., 60 with no refill. She has been stable with these meds. Encouraged ongoing activity as tolerated, and continue exercise program for her knees and lower back. Also showed her some exercises for her neck. Encouraged patient to do water exercises or walking in the water, she reports that her insurance will even pay for this when she goes to a gym were they have the silver sneaker program.  2. Oxycodone 15 mg 1 p.o. q.6 h. p.r.n., 120 with no refills.  Follow up  in one month.

## 2011-11-28 ENCOUNTER — Ambulatory Visit (INDEPENDENT_AMBULATORY_CARE_PROVIDER_SITE_OTHER): Payer: Medicare Other | Admitting: Family Medicine

## 2011-11-28 ENCOUNTER — Encounter: Payer: Self-pay | Admitting: Family Medicine

## 2011-11-28 VITALS — BP 133/78 | HR 119 | Ht 69.0 in | Wt 252.0 lb

## 2011-11-28 DIAGNOSIS — F17200 Nicotine dependence, unspecified, uncomplicated: Secondary | ICD-10-CM

## 2011-11-28 DIAGNOSIS — D239 Other benign neoplasm of skin, unspecified: Secondary | ICD-10-CM

## 2011-11-28 DIAGNOSIS — L989 Disorder of the skin and subcutaneous tissue, unspecified: Secondary | ICD-10-CM

## 2011-11-28 DIAGNOSIS — Z716 Tobacco abuse counseling: Secondary | ICD-10-CM

## 2011-11-28 DIAGNOSIS — L820 Inflamed seborrheic keratosis: Secondary | ICD-10-CM

## 2011-11-28 DIAGNOSIS — L821 Other seborrheic keratosis: Secondary | ICD-10-CM

## 2011-11-28 DIAGNOSIS — D229 Melanocytic nevi, unspecified: Secondary | ICD-10-CM

## 2011-11-28 DIAGNOSIS — Z7189 Other specified counseling: Secondary | ICD-10-CM

## 2011-11-28 DIAGNOSIS — Z720 Tobacco use: Secondary | ICD-10-CM

## 2011-11-28 MED ORDER — VARENICLINE TARTRATE 0.5 MG X 11 & 1 MG X 42 PO MISC: ORAL | Status: DC

## 2011-11-28 NOTE — Patient Instructions (Addendum)

## 2011-11-28 NOTE — Progress Notes (Signed)
Subjective:    Patient ID: [name], female    DOB: Oct 20, 1960, 51 y.o.   MRN: [id number]  HPI Tob abuse - here because she is interested in going smoking. She's here today with her husband who also wants to quit smoking. She has taken Chantix in the past and would like to start again. She tolerated it well. She is currently smoking a half to 1 pack a day.  She has a lesion on her left upper back that she would like me to look at today. She said she had seen a dermatologist not on with her for most of her moles and was told that everything looked reassuring as not bothersome. They recently she noticed some irritation in the mall on her left upper back. It started bleeding. She does not remember traumatizing it. She also has a new lesion on low mid back as well. This lesion started about a year ago has also been irritated and itchy. She says she's worried about it because it looks very much like a basal cell skin cancer she had removed from her nose.  Review of Systems     Objective:   Physical Exam  Constitutional: She is oriented to person, place, and time. She appears well-developed and well-nourished.  HENT:  Head: Normocephalic and atraumatic.  Cardiovascular: Normal rate, regular rhythm and normal heart sounds.   Pulmonary/Chest: Effort normal and breath sounds normal.  Neurological: She is alert and oriented to person, place, and time.  Skin: Skin is warm and dry.       She has an approximately 1/2 cm cracked and bleeding seborrheic keratosis on her left upper back. She has some smaller seborrheic keratoses scattered on her back. On the lower mid back right over the spine she has a dome shaped hyperkeratotic lesion. No cracking or bleeding or evidence of infection.  Psychiatric: She has a normal mood and affect. Her behavior is normal.          Assessment & Plan:  Tob abuse - Discussed chantix.  We discussed the potential side effects of the medication has taken  appropriately. I strongly encouraged her to complete 12 week course and she can extend longer she would like. We also discussed using a smoking cessation program in conjunction with the medication to increase her potential for success.  Inflammed/irriated seb keratosis, actively bleeding.  We discussed the option of removing the inflamed seborrheic keratosis especially because his actively bleeding and irritated. I am concerned like to do a shave biopsy and sent for pathology. The lesion on her low back was hyperkeratotic and very domed shaped. I did a shave biopsy of this lesion as well to sent for pathology since she does have a prior history of basal cancer. No pearlescense to the lesions.  Shave Biopsy Procedure Note  Pre-operative Diagnosis: Inflamed seborrheic keratosis.  Post-operative Diagnosis: same  Locations:left upper back  Indications: bleeding irritated  Anesthesia: Lidocaine 1% with epinephrine without added sodium bicarbonate  Procedure Details  History of allergy to iodine: no  Patient informed of the risks (including bleeding and infection) and benefits of the  procedure and Verbal informed consent obtained.  The lesion and surrounding area were given a sterile prep using betadyne and draped in the usual sterile fashion. A scalpel was used to shave an area of skin approximately 1.5cm by 1cm.  Hemostasis achieved with alumuninum chloride. Antibiotic ointment and a sterile dressing applied.  The specimen was sent for pathologic examination. The patient tolerated  the procedure well.  EBL: 0 ml  Findings: Will send for path  Condition: Stable  Complications: none.  Plan: 1. Instructed to keep the wound dry and covered for 24-48h and clean thereafter. 2. Warning signs of infection were reviewed.   3. Recommended that the patient use on chronic pain meds as needed for pain.  4. Return in prn  Shave Biopsy Procedure Note  Pre-operative Diagnosis: Suspicious  lesion  Post-operative Diagnosis: same  Locations:middle  lower back  Indications: worrisome, present x 1 year,   Anesthesia: Lidocaine 1% with epinephrine without added sodium bicarbonate  Procedure Details  History of allergy to iodine: no  Patient informed of the risks (including bleeding and infection) and benefits of the  procedure and Verbal informed consent obtained.  The lesion and surrounding area were given a sterile prep using betadyne and draped in the usual sterile fashion. A scalpel was used to shave an area of skin approximately 0.8 cm by 0.8cm.  Hemostasis achieved with alumuninum chloride. Antibiotic ointment and a sterile dressing applied.  The specimen was sent for pathologic examination. The patient tolerated the procedure well.  EBL: 0 ml  Findings: Will send for path  Condition: Stable  Complications: none.  Plan: 1. Instructed to keep the wound dry and covered for 24-48h and clean thereafter. 2. Warning signs of infection were reviewed.   3. Recommended that the patient use on chronic pain meds as needed for pain.  4. Return in prn

## 2011-12-05 ENCOUNTER — Other Ambulatory Visit: Payer: Self-pay | Admitting: *Deleted

## 2011-12-05 MED ORDER — PRAVASTATIN SODIUM 40 MG PO TABS: 40.0000 mg | ORAL_TABLET | Freq: Every day | ORAL | Status: DC

## 2011-12-05 MED ORDER — LEVOTHYROXINE SODIUM 25 MCG PO TABS: 25.0000 ug | ORAL_TABLET | Freq: Every day | ORAL | Status: DC

## 2011-12-26 ENCOUNTER — Encounter: Payer: Self-pay | Admitting: Physical Medicine and Rehabilitation

## 2011-12-26 ENCOUNTER — Encounter
Payer: Medicare Other | Attending: Physical Medicine and Rehabilitation | Admitting: Physical Medicine and Rehabilitation

## 2011-12-26 VITALS — BP 145/81 | HR 110 | Resp 16 | Ht 69.0 in | Wt 256.0 lb

## 2011-12-26 DIAGNOSIS — M545 Low back pain, unspecified: Secondary | ICD-10-CM | POA: Insufficient documentation

## 2011-12-26 DIAGNOSIS — G43109 Migraine with aura, not intractable, without status migrainosus: Secondary | ICD-10-CM

## 2011-12-26 DIAGNOSIS — IMO0002 Reserved for concepts with insufficient information to code with codable children: Secondary | ICD-10-CM

## 2011-12-26 DIAGNOSIS — G8929 Other chronic pain: Secondary | ICD-10-CM | POA: Insufficient documentation

## 2011-12-26 DIAGNOSIS — F329 Major depressive disorder, single episode, unspecified: Secondary | ICD-10-CM

## 2011-12-26 DIAGNOSIS — F32A Depression, unspecified: Secondary | ICD-10-CM

## 2011-12-26 DIAGNOSIS — Z5181 Encounter for therapeutic drug level monitoring: Secondary | ICD-10-CM

## 2011-12-26 DIAGNOSIS — M5137 Other intervertebral disc degeneration, lumbosacral region: Secondary | ICD-10-CM

## 2011-12-26 DIAGNOSIS — M47816 Spondylosis without myelopathy or radiculopathy, lumbar region: Secondary | ICD-10-CM

## 2011-12-26 DIAGNOSIS — M47817 Spondylosis without myelopathy or radiculopathy, lumbosacral region: Secondary | ICD-10-CM

## 2011-12-26 DIAGNOSIS — M1710 Unilateral primary osteoarthritis, unspecified knee: Secondary | ICD-10-CM | POA: Insufficient documentation

## 2011-12-26 DIAGNOSIS — G9050 Complex regional pain syndrome I, unspecified: Secondary | ICD-10-CM

## 2011-12-26 DIAGNOSIS — M1712 Unilateral primary osteoarthritis, left knee: Secondary | ICD-10-CM

## 2011-12-26 DIAGNOSIS — M549 Dorsalgia, unspecified: Secondary | ICD-10-CM

## 2011-12-26 DIAGNOSIS — M129 Arthropathy, unspecified: Secondary | ICD-10-CM | POA: Insufficient documentation

## 2011-12-26 DIAGNOSIS — M5136 Other intervertebral disc degeneration, lumbar region: Secondary | ICD-10-CM

## 2011-12-26 MED ORDER — OXYCODONE HCL 15 MG PO TABS: 15.0000 mg | ORAL_TABLET | Freq: Four times a day (QID) | ORAL | Status: DC | PRN

## 2011-12-26 MED ORDER — MORPHINE SULFATE ER 100 MG PO TBCR: 100.0000 mg | EXTENDED_RELEASE_TABLET | Freq: Two times a day (BID) | ORAL | Status: DC

## 2011-12-26 NOTE — Patient Instructions (Signed)
Try to  work out in the pool regularly, as pain and migraines allow.

## 2011-12-26 NOTE — Progress Notes (Signed)
Subjective:    Patient ID: [name], female    DOB: March 16, 1960, 51 y.o.   MRN: [id number]  HPI The patient is a 51 year old female female, who presents withLBP and bilateral knee pain , worse on the left s/p TKR . The patient complains about moderate to severe pain in her low back, especially in her sacrum when sitting for a prolonged time. Patient denies any radiating symptoms. Patient reports, that her symptoms are stable, she has good and bad days. Applying heat/ ice, taking medications ,changing positions alleviate the symptoms. Prolonged sitting or standing aggrevates the symptoms. The patient grades his pain as a 7 /10.The patient states, that her insurance is paying for a membership in a gym, and that she wants to start water exercises. Patient is here for a refill med check.  Pain Inventory Average Pain 8 Pain Right Now 7 My pain is constant, sharp, burning, dull, stabbing, tingling and aching  In the last 24 hours, has pain interfered with the following? General activity 9 Relation with others 9 Enjoyment of life 9 What TIME of day is your pain at its worst? all the time Sleep (in general) Poor  Pain is worse with: walking, bending, standing and some activites Pain improves with: rest, heat/ice, therapy/exercise, pacing activities, medication, TENS and injections Relief from Meds: 6  Mobility use a cane how many minutes can you walk? 5 ability to climb steps?  yes do you drive?  yes Do you have any goals in this area?  yes  Function disabled: date disabled 1/05 I need assistance with the following:  bathing, meal prep, household duties and shopping Do you have any goals in this area?  yes  Neuro/Psych bladder control problems weakness numbness tremor tingling trouble walking spasms dizziness confusion depression anxiety suicidal thoughts  Prior Studies Any changes since last visit?  no  Physicians involved in your care Any changes since last  visit?  no   Family History  Problem Relation Age of Onset  . Depression Mother   . Hypertension Mother   . Hypertension Father   . Heart failure Father   . Diabetes Father   . Heart attack Father   . Colon cancer Neg Hx   . Esophageal cancer Neg Hx   . Stomach cancer Neg Hx   . Rectal cancer Neg Hx    History   Social History  . Marital Status: Married    Spouse Name: Sullivan Lone    Number of Children: 1  . Years of Education: some colle   Occupational History  . On disability    Social History Main Topics  . Smoking status: Current Every Day Smoker -- 0.5 packs/day for 39 years    Types: Cigarettes  . Smokeless tobacco: Never Used     Comment: going to try e-sticks to quit  . Alcohol Use: No  . Drug Use: No  . Sexually Active: Not Currently -- Female partner(s)   Other Topics Concern  . None   Social History Narrative   8-10 cups of soda a day.  On disability. No regular exercise.     Past Surgical History  Procedure Date  . Tonsillectomy 09/1972  . Ulnar nerve entrapment Feb 1995    left elbow  . Carpal tunnel release 07/1993    both hands  . Tubal ligation Sept 1999  . Hemorroidectomy July 2003  . Knee arthroplasty 09/2001, 05/2003    left knee, chondromalasia  . Abdominal hysterectomy May 2004  .  Replacement total knee Nov 2005    Left   . Thyroid lobectomy 01/2005    malignant area removed from right lobe  . Basel cell carcinoma 06/2005, 07/2005    nasal tip and reconstruction  . Knee arthroscopy jan 2005    Right knee, tisse release, chrondromalacia  . Colonoscopy   . Thyroid surgery     thyroid lobe removal   Past Medical History  Diagnosis Date  . Chronic pain syndrome   . Intervertebral lumbar disc disorder with myelopathy, lumbar region   . Facet syndrome, lumbar   . Primary localized osteoarthrosis, lower leg   . Degeneration of lumbar or lumbosacral intervertebral disc   . Unspecified musculoskeletal disorders and symptoms referable to neck       cervical/trapezius  . Migraine without aura, with intractable migraine, so stated, without mention of status migrainosus   . Depression   . Lumbosacral spondylosis without myelopathy   . Allergy   . Anxiety   . Cancer     cancerous nodules on thyroid  . Cancer     basal cell Cancer-nose  . COPD (chronic obstructive pulmonary disease)   . GERD (gastroesophageal reflux disease)   . Hyperlipidemia   . Thyroid disease     hypothyroid   BP 145/81  Pulse 110  Resp 16  Ht 5\' 9"  (1.753 m)  Wt 256 lb (116.121 kg)  BMI 37.80 kg/m2  SpO2 95%     Review of Systems  Constitutional: Positive for diaphoresis and unexpected weight change.  Gastrointestinal: Positive for nausea and constipation.  Musculoskeletal: Positive for myalgias, back pain, arthralgias and gait problem.  Neurological: Positive for dizziness, weakness and numbness.  Psychiatric/Behavioral: Positive for suicidal ideas, confusion and dysphoric mood. The patient is nervous/anxious.   All other systems reviewed and are negative.       Objective:   Physical Exam Constitutional: She is oriented to person, place, and time. She appears well-developed.  HENT:  Head: Normocephalic.  Eyes: Pupils are equal, round, and reactive to light.  Neck: Normal range of motion.  Neurological: She is alert and oriented to person, place, and time.  Skin: Skin is warm and dry.  Psychiatric: She has a normal mood and affect.  Symmetric normal motor tone is noted throughout. Normal muscle bulk. Muscle testing reveals 5/5 muscle strength of the upper extremity, and 5/5 of the lower extremity, except left quadriceps 4/5. Full range of motion in upper and lower extremities, except left knee restricted in extension 10 degrees.. ROM of spine is restricted.  DTR in the upper and lower extremity are present and symmetric 1+. No clonus is noted.  Patient arises from chair with difficulty. Wide based gait with cane and forward flexed spine.  Left knee is mildly swollen, but not warm.         Assessment & Plan:  1. Chronic low back pain.  2. Facet arthropathy.  3. Degenerative disk disease.  4. Osteoarthritis of the knees.  5. Migraine headaches with aura  PLAN:  1. Refill MS Contin 100 mg 1 p.o. q.12 h., 60 with no refill. She has been stable with these meds. Encouraged ongoing activity as tolerated, and continue exercise program for her knees and lower back. Also showed her some exercises for her neck. Encouraged patient to do water exercises or walking in the water, she reports that her insurance will even pay for this when she goes to a gym were they have the silver sneaker program.  2. Oxycodone  15 mg 1 p.o. q.6 h. p.r.n., 120 with no refills.  Encouraged patient to stop smoking, with the chantix when she can afford the medication. Follow up in one month.

## 2012-01-01 ENCOUNTER — Other Ambulatory Visit: Payer: Self-pay | Admitting: *Deleted

## 2012-01-01 DIAGNOSIS — M5136 Other intervertebral disc degeneration, lumbar region: Secondary | ICD-10-CM

## 2012-01-01 DIAGNOSIS — M47816 Spondylosis without myelopathy or radiculopathy, lumbar region: Secondary | ICD-10-CM

## 2012-01-01 DIAGNOSIS — M1712 Unilateral primary osteoarthritis, left knee: Secondary | ICD-10-CM

## 2012-01-01 DIAGNOSIS — G43109 Migraine with aura, not intractable, without status migrainosus: Secondary | ICD-10-CM

## 2012-01-01 DIAGNOSIS — F329 Major depressive disorder, single episode, unspecified: Secondary | ICD-10-CM

## 2012-01-01 DIAGNOSIS — F32A Depression, unspecified: Secondary | ICD-10-CM

## 2012-01-01 MED ORDER — GABAPENTIN 600 MG PO TABS: 600.0000 mg | ORAL_TABLET | Freq: Four times a day (QID) | ORAL | Status: DC

## 2012-01-24 ENCOUNTER — Encounter: Payer: Self-pay | Admitting: Physical Medicine & Rehabilitation

## 2012-01-24 ENCOUNTER — Encounter: Payer: Medicare Other | Attending: Physical Medicine and Rehabilitation | Admitting: Physical Medicine & Rehabilitation

## 2012-01-24 VITALS — BP 143/88 | HR 121 | Resp 16 | Ht 69.0 in | Wt 254.0 lb

## 2012-01-24 DIAGNOSIS — M7918 Myalgia, other site: Secondary | ICD-10-CM

## 2012-01-24 DIAGNOSIS — IMO0002 Reserved for concepts with insufficient information to code with codable children: Secondary | ICD-10-CM | POA: Insufficient documentation

## 2012-01-24 DIAGNOSIS — M47817 Spondylosis without myelopathy or radiculopathy, lumbosacral region: Secondary | ICD-10-CM

## 2012-01-24 DIAGNOSIS — M1710 Unilateral primary osteoarthritis, unspecified knee: Secondary | ICD-10-CM | POA: Insufficient documentation

## 2012-01-24 DIAGNOSIS — F3289 Other specified depressive episodes: Secondary | ICD-10-CM | POA: Insufficient documentation

## 2012-01-24 DIAGNOSIS — G43109 Migraine with aura, not intractable, without status migrainosus: Secondary | ICD-10-CM | POA: Insufficient documentation

## 2012-01-24 DIAGNOSIS — M47816 Spondylosis without myelopathy or radiculopathy, lumbar region: Secondary | ICD-10-CM

## 2012-01-24 DIAGNOSIS — F32A Depression, unspecified: Secondary | ICD-10-CM

## 2012-01-24 DIAGNOSIS — M51379 Other intervertebral disc degeneration, lumbosacral region without mention of lumbar back pain or lower extremity pain: Secondary | ICD-10-CM | POA: Insufficient documentation

## 2012-01-24 DIAGNOSIS — IMO0001 Reserved for inherently not codable concepts without codable children: Secondary | ICD-10-CM

## 2012-01-24 DIAGNOSIS — M5136 Other intervertebral disc degeneration, lumbar region: Secondary | ICD-10-CM

## 2012-01-24 DIAGNOSIS — M5137 Other intervertebral disc degeneration, lumbosacral region: Secondary | ICD-10-CM

## 2012-01-24 DIAGNOSIS — M1712 Unilateral primary osteoarthritis, left knee: Secondary | ICD-10-CM

## 2012-01-24 DIAGNOSIS — F329 Major depressive disorder, single episode, unspecified: Secondary | ICD-10-CM

## 2012-01-24 MED ORDER — OXYCODONE HCL 15 MG PO TABS: 15.0000 mg | ORAL_TABLET | Freq: Four times a day (QID) | ORAL | Status: DC | PRN

## 2012-01-24 MED ORDER — MORPHINE SULFATE ER 100 MG PO TBCR: 100.0000 mg | EXTENDED_RELEASE_TABLET | Freq: Two times a day (BID) | ORAL | Status: DC

## 2012-01-24 NOTE — Progress Notes (Signed)
Subjective:    Patient ID: [name], female    DOB: 11/14/1960, 51 y.o.   MRN: [id number]  HPI  Lamira is back regarding her chronic pain. She has been suffering with a bronchitis over the last couple weeks. The bronchitis triggered 2 migraine headaches.   She has had a shooting pain under her left shoulder blade. We had given her a TPI in the past which was quite helpful. The area affects her when she's sitting, standing, reading, etc. The pain generally shoots up to her neck.  Her left knee still remains problematic. The pain is worse with standing and any activity. She wears her knee brace whenver possible, although she doesn't wear it as often with pants because it slides.  Pain Inventory Average Pain 7 Pain Right Now 7 My pain is constant, sharp, burning, dull, stabbing, tingling and aching  In the last 24 hours, has pain interfered with the following? General activity 9 Relation with others 9 Enjoyment of life 9 What TIME of day is your pain at its worst? all the time Sleep (in general) Poor  Pain is worse with: walking, bending, standing and some activites Pain improves with: rest, heat/ice, therapy/exercise, medication and TENS Relief from Meds: 6  Mobility use a cane how many minutes can you walk? 5 ability to climb steps?  yes do you drive?  yes Do you have any goals in this area?  yes  Function not employed: date last employed 1/05 disabled: date disabled 07/07 I need assistance with the following:  bathing, meal prep, household duties and shopping Do you have any goals in this area?  yes  Neuro/Psych bladder control problems weakness numbness tremor tingling trouble walking spasms dizziness confusion depression anxiety suicidal thoughts  Prior Studies Any changes since last visit?  no  Physicians involved in your care Any changes since last visit?  no   Family History  Problem Relation Age of Onset  . Depression Mother   .  Hypertension Mother   . Hypertension Father   . Heart failure Father   . Diabetes Father   . Heart attack Father   . Colon cancer Neg Hx   . Esophageal cancer Neg Hx   . Stomach cancer Neg Hx   . Rectal cancer Neg Hx    History   Social History  . Marital Status: Married    Spouse Name: Sullivan Lone    Number of Children: 1  . Years of Education: some colle   Occupational History  . On disability    Social History Main Topics  . Smoking status: Current Every Day Smoker -- 0.5 packs/day for 39 years    Types: Cigarettes  . Smokeless tobacco: Never Used     Comment: going to try e-sticks to quit  . Alcohol Use: No  . Drug Use: No  . Sexually Active: Not Currently -- Female partner(s)   Other Topics Concern  . None   Social History Narrative   8-10 cups of soda a day.  On disability. No regular exercise.     Past Surgical History  Procedure Date  . Tonsillectomy 09/1972  . Ulnar nerve entrapment Feb 1995    left elbow  . Carpal tunnel release 07/1993    both hands  . Tubal ligation Sept 1999  . Hemorroidectomy July 2003  . Knee arthroplasty 09/2001, 05/2003    left knee, chondromalasia  . Abdominal hysterectomy May 2004  . Replacement total knee Nov 2005    Left   .  Thyroid lobectomy 01/2005    malignant area removed from right lobe  . Basel cell carcinoma 06/2005, 07/2005    nasal tip and reconstruction  . Knee arthroscopy jan 2005    Right knee, tisse release, chrondromalacia  . Colonoscopy   . Thyroid surgery     thyroid lobe removal   Past Medical History  Diagnosis Date  . Chronic pain syndrome   . Intervertebral lumbar disc disorder with myelopathy, lumbar region   . Facet syndrome, lumbar   . Primary localized osteoarthrosis, lower leg   . Degeneration of lumbar or lumbosacral intervertebral disc   . Unspecified musculoskeletal disorders and symptoms referable to neck     cervical/trapezius  . Migraine without aura, with intractable migraine, so stated,  without mention of status migrainosus   . Depression   . Lumbosacral spondylosis without myelopathy   . Allergy   . Anxiety   . Cancer     cancerous nodules on thyroid  . Cancer     basal cell Cancer-nose  . COPD (chronic obstructive pulmonary disease)   . GERD (gastroesophageal reflux disease)   . Hyperlipidemia   . Thyroid disease     hypothyroid   BP 143/88  Pulse 121  Resp 16  Ht 5\' 9"  (1.753 m)  Wt 254 lb (115.214 kg)  BMI 37.51 kg/m2  SpO2 93%     Review of Systems  Constitutional: Positive for diaphoresis and unexpected weight change.  HENT: Positive for neck pain.   Respiratory: Positive for cough and wheezing.   Gastrointestinal: Positive for nausea, vomiting, abdominal pain and constipation.  Musculoskeletal: Positive for back pain and gait problem.  Neurological: Positive for dizziness, tremors, weakness and numbness.  Psychiatric/Behavioral: Positive for confusion and dysphoric mood. The patient is nervous/anxious.   All other systems reviewed and are negative.       Objective:   Physical Exam  Constitutional: She is oriented to person, place, and time. She appears well-developed and well-nourished.  overweight  HENT:  Head: Normocephalic.  Eyes: Conjunctivae and EOM are normal. Pupils are equal, round, and reactive to light.  Neck: Normal range of motion.  Cardiovascular: Normal rate and regular rhythm.  Pulmonary/Chest: Effort normal and breath sounds normal.  Abdominal: Soft.  Musculoskeletal: Normal range of motion.  Left knee with warmth and swelling with pain with resisted flex and ext. She is wearing her OA knee Brace. She can flex and touch her toes with pain. She has pain with extension past 20-30 degrees. She has some discomfort with extension from seated position  She ambulates using a straight cane to off load the left knee. Continued  pain with WB on the left is noted.  Trigger point noted at the inferior/medial edge of the left scapula.   Neurological: She is alert and oriented to person, place, and time. She has normal strength. No cranial nerve deficit or sensory deficit.  Skin: Skin is warm.  Psychiatric: She has a normal mood and affect. Her behavior is normal. Judgment and thought content normal.   Assessment & Plan:   ASSESSMENT:  1. Chronic low back pain.  2. Facet arthropathy.  3. Degenerative disk disease.  4. Osteoarthritis of the knees.  5. Migraine headaches with aura  6. Myofascial pain---rhomboids affected on the left today  PLAN:  1. Refill MS Contin 100 mg 1 p.o. q.12 h., 60 with no refill. I gave her second scripts for next month. She has been stable with these meds. Encouraged ongoing activity as  tolerated.  2. Oxycodone 15 mg 1 p.o. q.6 h. p.r.n., 120 with no refill with a script for next month.  3. After informed consent, I injected the left periscapular trigger point with 2cc 1% lidocaine. The patient tolerated well.  4. Her questions were encouraged and answered. She will follow up in a  Month with PA. I did refill her neurontin, pamelor, flexeril, and celexa today

## 2012-01-24 NOTE — Patient Instructions (Signed)
Call me with questions 

## 2012-02-21 ENCOUNTER — Encounter: Payer: Self-pay | Admitting: Physical Medicine and Rehabilitation

## 2012-02-21 ENCOUNTER — Telehealth: Payer: Self-pay | Admitting: *Deleted

## 2012-02-21 ENCOUNTER — Encounter
Payer: Medicare Other | Attending: Physical Medicine and Rehabilitation | Admitting: Physical Medicine and Rehabilitation

## 2012-02-21 VITALS — BP 173/83 | HR 124 | Resp 18 | Ht 69.0 in | Wt 259.0 lb

## 2012-02-21 DIAGNOSIS — M25569 Pain in unspecified knee: Secondary | ICD-10-CM | POA: Insufficient documentation

## 2012-02-21 DIAGNOSIS — F329 Major depressive disorder, single episode, unspecified: Secondary | ICD-10-CM

## 2012-02-21 DIAGNOSIS — G43109 Migraine with aura, not intractable, without status migrainosus: Secondary | ICD-10-CM

## 2012-02-21 DIAGNOSIS — M545 Low back pain, unspecified: Secondary | ICD-10-CM | POA: Insufficient documentation

## 2012-02-21 DIAGNOSIS — M5137 Other intervertebral disc degeneration, lumbosacral region: Secondary | ICD-10-CM

## 2012-02-21 DIAGNOSIS — M533 Sacrococcygeal disorders, not elsewhere classified: Secondary | ICD-10-CM | POA: Insufficient documentation

## 2012-02-21 DIAGNOSIS — F32A Depression, unspecified: Secondary | ICD-10-CM

## 2012-02-21 DIAGNOSIS — M1712 Unilateral primary osteoarthritis, left knee: Secondary | ICD-10-CM

## 2012-02-21 DIAGNOSIS — M1710 Unilateral primary osteoarthritis, unspecified knee: Secondary | ICD-10-CM

## 2012-02-21 DIAGNOSIS — M5136 Other intervertebral disc degeneration, lumbar region: Secondary | ICD-10-CM

## 2012-02-21 DIAGNOSIS — M47816 Spondylosis without myelopathy or radiculopathy, lumbar region: Secondary | ICD-10-CM

## 2012-02-21 DIAGNOSIS — IMO0002 Reserved for concepts with insufficient information to code with codable children: Secondary | ICD-10-CM

## 2012-02-21 DIAGNOSIS — M47817 Spondylosis without myelopathy or radiculopathy, lumbosacral region: Secondary | ICD-10-CM

## 2012-02-21 MED ORDER — MORPHINE SULFATE ER 100 MG PO TBCR: 100.0000 mg | EXTENDED_RELEASE_TABLET | Freq: Two times a day (BID) | ORAL | Status: DC

## 2012-02-21 MED ORDER — VARENICLINE TARTRATE 1 MG PO TABS: 1.0000 mg | ORAL_TABLET | Freq: Two times a day (BID) | ORAL | Status: DC

## 2012-02-21 MED ORDER — OXYCODONE HCL 15 MG PO TABS: 15.0000 mg | ORAL_TABLET | Freq: Four times a day (QID) | ORAL | Status: DC | PRN

## 2012-02-21 NOTE — Telephone Encounter (Signed)
Pt calls and would like to get the Chantix 2nd month pack sent to [facility]

## 2012-02-21 NOTE — Progress Notes (Signed)
Subjective:    Patient ID: [name], female    DOB: [birth year], 52 y.o.   MRN: [id number]  HPI The patient is a 52 year old female female, who presents withLBP and bilateral knee pain , worse on the left s/p TKR . The patient complains about moderate to severe pain in her low back, especially in her sacrum when sitting for a prolonged time. Patient denies any radiating symptoms. Patient reports, that her symptoms are stable, she has good and bad days. Applying heat/ ice, taking medications ,changing positions alleviate the symptoms. Prolonged sitting or standing aggrevates the symptoms. The patient grades his pain as a 7 /10.The patient states, that she has started water exercises, and that she is not smoking for 19 days now !. Patient is here for a refill med check.She states, that it will be difficult to get her medication from the small pharmacy she has used until now, she might switch pharmacies.  Pain Inventory Average Pain 7 Pain Right Now 7 My pain is constant, sharp, burning, dull, stabbing, tingling and aching  In the last 24 hours, has pain interfered with the following? General activity 9 Relation with others 9 Enjoyment of life 9 What TIME of day is your pain at its worst? all the time Sleep (in general) Poor  Pain is worse with: walking, bending, standing and some activites Pain improves with: rest, heat/ice, therapy/exercise, medication, TENS and injections Relief from Meds: 6  Mobility use a cane how many minutes can you walk? 5-10 ability to climb steps?  yes do you drive?  yes Do you have any goals in this area?  yes  Function not employed: date last employed 02-24-03 disabled: date disabled 7/07 I need assistance with the following:  bathing, meal prep, household duties and shopping Do you have any goals in this area?  yes  Neuro/Psych bladder control problems weakness numbness tremor tingling trouble  walking spasms dizziness confusion depression anxiety suicidal thoughts no plan  Prior Studies x-rays CT/MRI  Physicians involved in your care Any changes since last visit?  no   Family History  Problem Relation Age of Onset  . Depression Mother   . Hypertension Mother   . Hypertension Father   . Heart failure Father   . Diabetes Father   . Heart attack Father   . Colon cancer Neg Hx   . Esophageal cancer Neg Hx   . Stomach cancer Neg Hx   . Rectal cancer Neg Hx    History   Social History  . Marital Status: Married    Spouse Name: Sullivan Lone    Number of Children: 1  . Years of Education: some colle   Occupational History  . On disability    Social History Main Topics  . Smoking status: Former Smoker -- 0.5 packs/day for 39 years    Types: Cigarettes    Quit date: 02/03/2012  . Smokeless tobacco: Never Used     Comment: going to try e-sticks to quit  . Alcohol Use: No  . Drug Use: No  . Sexually Active: Not Currently -- Female partner(s)   Other Topics Concern  . None   Social History Narrative   8-10 cups of soda a day.  On disability. No regular exercise.     Past Surgical History  Procedure Date  . Tonsillectomy 09/1972  . Ulnar nerve entrapment Feb 1995    left elbow  . Carpal tunnel release 07/1993    both hands  . Tubal  ligation Sept 1999  . Hemorroidectomy July 2003  . Knee arthroplasty 09/2001, 05/2003    left knee, chondromalasia  . Abdominal hysterectomy May 2004  . Replacement total knee Nov 2005    Left   . Thyroid lobectomy 01/2005    malignant area removed from right lobe  . Basel cell carcinoma 06/2005, 07/2005    nasal tip and reconstruction  . Knee arthroscopy jan 2005    Right knee, tisse release, chrondromalacia  . Colonoscopy   . Thyroid surgery     thyroid lobe removal   Past Medical History  Diagnosis Date  . Chronic pain syndrome   . Intervertebral lumbar disc disorder with myelopathy, lumbar region   . Facet syndrome,  lumbar   . Primary localized osteoarthrosis, lower leg   . Degeneration of lumbar or lumbosacral intervertebral disc   . Unspecified musculoskeletal disorders and symptoms referable to neck     cervical/trapezius  . Migraine without aura, with intractable migraine, so stated, without mention of status migrainosus   . Depression   . Lumbosacral spondylosis without myelopathy   . Allergy   . Anxiety   . Cancer     cancerous nodules on thyroid  . Cancer     basal cell Cancer-nose  . COPD (chronic obstructive pulmonary disease)   . GERD (gastroesophageal reflux disease)   . Hyperlipidemia   . Thyroid disease     hypothyroid   BP 173/83  Pulse 124  Resp 18  Ht 5\' 9"  (1.753 m)  Wt 259 lb (117.482 kg)  BMI 38.25 kg/m2  SpO2 97%    Review of Systems  Constitutional: Positive for diaphoresis, appetite change and unexpected weight change.  Respiratory: Positive for cough and wheezing.   Cardiovascular: Positive for leg swelling.  Gastrointestinal: Positive for nausea, vomiting and constipation.  Musculoskeletal: Positive for back pain and gait problem.  Neurological: Positive for dizziness, tremors, weakness and numbness.       Tingling, spasms  Psychiatric/Behavioral: Positive for suicidal ideas, confusion and dysphoric mood. The patient is nervous/anxious.   All other systems reviewed and are negative.       Objective:   Physical Exam Constitutional: She is oriented to person, place, and time. She appears well-developed.  HENT:  Head: Normocephalic.  Eyes: Pupils are equal, round, and reactive to light.  Neck: Normal range of motion.  Neurological: She is alert and oriented to person, place, and time.  Skin: Skin is warm and dry.  Psychiatric: She has a normal mood and affect.  Symmetric normal motor tone is noted throughout. Normal muscle bulk. Muscle testing reveals 5/5 muscle strength of the upper extremity, and 5/5 of the lower extremity, except left quadriceps 4/5.  Full range of motion in upper and lower extremities, except left knee restricted in extension 10 degrees.. ROM of spine is restricted.  DTR in the upper and lower extremity are present and symmetric 1+. No clonus is noted.  Patient arises from chair with difficulty. Wide based gait with cane and forward flexed spine. Left knee is mildly swollen, but not warm.         Assessment & Plan:  1. Refill MS Contin 100 mg 1 p.o. q.12 h., 60 with no refill. She has been stable with these meds. Encouraged ongoing activity as tolerated, and continue exercise program for her knees and lower back. Also showed her some exercises for her neck. Encouraged patient to continue with water exercises or walking in the water.  2. Oxycodone 15  mg 1 p.o. q.6 h. p.r.n., 120 with no refills.  Encouraged patient to continue with not smoking, with the chantix.  She states, that it will be difficult to get her medication from the small pharmacy she has used until now, she might switch pharmacies.She will keep [place] informed . Follow up in one month.

## 2012-02-21 NOTE — Telephone Encounter (Signed)
Pt informed

## 2012-02-21 NOTE — Patient Instructions (Signed)
Continue with exercising in the pool, as pain permits.

## 2012-02-21 NOTE — Telephone Encounter (Signed)
rx sent

## 2012-02-26 ENCOUNTER — Other Ambulatory Visit: Payer: Self-pay | Admitting: *Deleted

## 2012-02-26 DIAGNOSIS — M5136 Other intervertebral disc degeneration, lumbar region: Secondary | ICD-10-CM

## 2012-02-26 DIAGNOSIS — M47816 Spondylosis without myelopathy or radiculopathy, lumbar region: Secondary | ICD-10-CM

## 2012-02-26 DIAGNOSIS — F329 Major depressive disorder, single episode, unspecified: Secondary | ICD-10-CM

## 2012-02-26 DIAGNOSIS — F32A Depression, unspecified: Secondary | ICD-10-CM

## 2012-02-26 DIAGNOSIS — G43109 Migraine with aura, not intractable, without status migrainosus: Secondary | ICD-10-CM

## 2012-02-26 DIAGNOSIS — M1712 Unilateral primary osteoarthritis, left knee: Secondary | ICD-10-CM

## 2012-02-26 MED ORDER — CYCLOBENZAPRINE HCL 5 MG PO TABS: 5.0000 mg | ORAL_TABLET | Freq: Three times a day (TID) | ORAL | Status: DC | PRN

## 2012-03-04 ENCOUNTER — Telehealth: Payer: Self-pay | Admitting: *Deleted

## 2012-03-04 NOTE — Telephone Encounter (Signed)
[name]'s new pharmacy is calling about refilling her promethazine. You probably have filled it before in the old red chart but I se no mention of it in any of the notes. Do you want to refill?

## 2012-03-05 MED ORDER — PROMETHAZINE HCL 12.5 MG PO TABS: 12.5000 mg | ORAL_TABLET | Freq: Two times a day (BID) | ORAL | Status: DC

## 2012-03-05 NOTE — Telephone Encounter (Signed)
Ok to fill phenergan

## 2012-03-05 NOTE — Telephone Encounter (Signed)
Phenergan escribed to Pharmacy

## 2012-03-21 ENCOUNTER — Encounter: Payer: Medicare Other | Admitting: Physical Medicine and Rehabilitation

## 2012-04-08 ENCOUNTER — Encounter: Payer: Self-pay | Admitting: Physical Medicine and Rehabilitation

## 2012-04-08 ENCOUNTER — Encounter
Payer: Medicare Other | Attending: Physical Medicine and Rehabilitation | Admitting: Physical Medicine and Rehabilitation

## 2012-04-08 VITALS — BP 148/67 | HR 123 | Resp 16 | Ht 69.0 in | Wt 254.0 lb

## 2012-04-08 DIAGNOSIS — M5136 Other intervertebral disc degeneration, lumbar region: Secondary | ICD-10-CM

## 2012-04-08 DIAGNOSIS — M5137 Other intervertebral disc degeneration, lumbosacral region: Secondary | ICD-10-CM

## 2012-04-08 DIAGNOSIS — G43109 Migraine with aura, not intractable, without status migrainosus: Secondary | ICD-10-CM

## 2012-04-08 DIAGNOSIS — K219 Gastro-esophageal reflux disease without esophagitis: Secondary | ICD-10-CM | POA: Insufficient documentation

## 2012-04-08 DIAGNOSIS — M47816 Spondylosis without myelopathy or radiculopathy, lumbar region: Secondary | ICD-10-CM

## 2012-04-08 DIAGNOSIS — F329 Major depressive disorder, single episode, unspecified: Secondary | ICD-10-CM

## 2012-04-08 DIAGNOSIS — Z96659 Presence of unspecified artificial knee joint: Secondary | ICD-10-CM | POA: Insufficient documentation

## 2012-04-08 DIAGNOSIS — E039 Hypothyroidism, unspecified: Secondary | ICD-10-CM | POA: Insufficient documentation

## 2012-04-08 DIAGNOSIS — M545 Low back pain, unspecified: Secondary | ICD-10-CM | POA: Insufficient documentation

## 2012-04-08 DIAGNOSIS — M47817 Spondylosis without myelopathy or radiculopathy, lumbosacral region: Secondary | ICD-10-CM

## 2012-04-08 DIAGNOSIS — J449 Chronic obstructive pulmonary disease, unspecified: Secondary | ICD-10-CM | POA: Insufficient documentation

## 2012-04-08 DIAGNOSIS — IMO0002 Reserved for concepts with insufficient information to code with codable children: Secondary | ICD-10-CM

## 2012-04-08 DIAGNOSIS — M25569 Pain in unspecified knee: Secondary | ICD-10-CM | POA: Insufficient documentation

## 2012-04-08 DIAGNOSIS — F32A Depression, unspecified: Secondary | ICD-10-CM

## 2012-04-08 DIAGNOSIS — J4489 Other specified chronic obstructive pulmonary disease: Secondary | ICD-10-CM | POA: Insufficient documentation

## 2012-04-08 DIAGNOSIS — E785 Hyperlipidemia, unspecified: Secondary | ICD-10-CM | POA: Insufficient documentation

## 2012-04-08 DIAGNOSIS — M1712 Unilateral primary osteoarthritis, left knee: Secondary | ICD-10-CM

## 2012-04-08 MED ORDER — CITALOPRAM HYDROBROMIDE 40 MG PO TABS: 40.0000 mg | ORAL_TABLET | Freq: Every day | ORAL | Status: DC

## 2012-04-08 MED ORDER — MORPHINE SULFATE ER 100 MG PO TBCR: 100.0000 mg | EXTENDED_RELEASE_TABLET | Freq: Two times a day (BID) | ORAL | Status: DC

## 2012-04-08 MED ORDER — PROMETHAZINE HCL 12.5 MG PO TABS: 12.5000 mg | ORAL_TABLET | Freq: Two times a day (BID) | ORAL | Status: DC

## 2012-04-08 MED ORDER — OXYCODONE HCL 15 MG PO TABS: 15.0000 mg | ORAL_TABLET | Freq: Four times a day (QID) | ORAL | Status: DC | PRN

## 2012-04-08 MED ORDER — NORTRIPTYLINE HCL 25 MG PO CAPS: 25.0000 mg | ORAL_CAPSULE | Freq: Every day | ORAL | Status: DC

## 2012-04-08 NOTE — Patient Instructions (Signed)
Try to stay as active as tolerated 

## 2012-04-08 NOTE — Progress Notes (Signed)
Subjective:    Patient ID: [name], female    DOB: June 02, 1960, 52 y.o.   MRN: [id number]  HPI The patient is a 52 year old female female, who presents withLBP and bilateral knee pain , worse on the left s/p TKR . The patient complains about moderate to severe pain in her low back, especially in her sacrum when sitting for a prolonged time. Patient denies any radiating symptoms. Patient reports, that her symptoms are stable, she has good and bad days. Applying heat/ ice, taking medications ,changing positions alleviate the symptoms. Prolonged sitting or standing aggrevates the symptoms. The patient grades his pain as a 7 /10.The patient states, that she has started water exercises, and that she is not smoking for 10 weeks now !. Patient is here for a refill med check. pharmacies.   Pain Inventory Average Pain 8 Pain Right Now 7 My pain is constant, sharp, burning, dull, stabbing, tingling and aching  In the last 24 hours, has pain interfered with the following? General activity 9 Relation with others 9 Enjoyment of life 9 What TIME of day is your pain at its worst? all Sleep (in general) Poor  Pain is worse with: walking, bending, standing and some activites Pain improves with: rest, heat/ice, therapy/exercise, medication, TENS and injections Relief from Meds: 6  Mobility use a cane how many minutes can you walk? 5 ability to climb steps?  yes do you drive?  yes  Function disabled: date disabled 02/24/2003 I need assistance with the following:  bathing, meal prep, household duties and shopping  Neuro/Psych bladder control problems weakness numbness tremor tingling trouble walking spasms dizziness confusion depression anxiety  Prior Studies Any changes since last visit?  no  Physicians involved in your care Any changes since last visit?  no   Family History  Problem Relation Age of Onset  . Depression Mother   . Hypertension Mother   . Hypertension  Father   . Heart failure Father   . Diabetes Father   . Heart attack Father   . Colon cancer Neg Hx   . Esophageal cancer Neg Hx   . Stomach cancer Neg Hx   . Rectal cancer Neg Hx    History   Social History  . Marital Status: Married    Spouse Name: Sullivan Lone    Number of Children: 1  . Years of Education: some colle   Occupational History  . On disability    Social History Main Topics  . Smoking status: Former Smoker -- 0.50 packs/day for 39 years    Types: Cigarettes    Quit date: 02/03/2012  . Smokeless tobacco: Never Used     Comment: going to try e-sticks to quit  . Alcohol Use: No  . Drug Use: No  . Sexually Active: Not Currently -- Female partner(s)   Other Topics Concern  . None   Social History Narrative   8-10 cups of soda a day.  On disability. No regular exercise.     Past Surgical History  Procedure Laterality Date  . Tonsillectomy  09/1972  . Ulnar nerve entrapment  Feb 1995    left elbow  . Carpal tunnel release  07/1993    both hands  . Tubal ligation  Sept 1999  . Hemorroidectomy  July 2003  . Knee arthroplasty  09/2001, 05/2003    left knee, chondromalasia  . Abdominal hysterectomy  May 2004  . Replacement total knee  Nov 2005    Left   .  Thyroid lobectomy  01/2005    malignant area removed from right lobe  . Basel cell carcinoma  06/2005, 07/2005    nasal tip and reconstruction  . Knee arthroscopy  jan 2005    Right knee, tisse release, chrondromalacia  . Colonoscopy    . Thyroid surgery      thyroid lobe removal   Past Medical History  Diagnosis Date  . Chronic pain syndrome   . Intervertebral lumbar disc disorder with myelopathy, lumbar region   . Facet syndrome, lumbar   . Primary localized osteoarthrosis, lower leg   . Degeneration of lumbar or lumbosacral intervertebral disc   . Unspecified musculoskeletal disorders and symptoms referable to neck     cervical/trapezius  . Migraine without aura, with intractable migraine, so stated,  without mention of status migrainosus   . Depression   . Lumbosacral spondylosis without myelopathy   . Allergy   . Anxiety   . Cancer     cancerous nodules on thyroid  . Cancer     basal cell Cancer-nose  . COPD (chronic obstructive pulmonary disease)   . GERD (gastroesophageal reflux disease)   . Hyperlipidemia   . Thyroid disease     hypothyroid   BP 148/67  Pulse 123  Resp 16  Ht 5\' 9"  (1.753 m)  Wt 254 lb (115.214 kg)  BMI 37.49 kg/m2  SpO2 93%   Review of Systems  Constitutional: Positive for chills, diaphoresis and appetite change.  Gastrointestinal: Positive for nausea, vomiting and diarrhea.  Genitourinary:       Bladder control problems  Musculoskeletal: Positive for gait problem.       Spasms  Neurological: Positive for tremors, weakness and numbness.       Tingling  Psychiatric/Behavioral: Positive for confusion and dysphoric mood. The patient is nervous/anxious.   All other systems reviewed and are negative.       Objective:   Physical Exam Constitutional: She is oriented to person, place, and time. She appears well-developed.  HENT:  Head: Normocephalic.  Eyes: Pupils are equal, round, and reactive to light.  Neck: Normal range of motion.  Neurological: She is alert and oriented to person, place, and time.  Skin: Skin is warm and dry.  Psychiatric: She has a normal mood and affect.  Symmetric normal motor tone is noted throughout. Normal muscle bulk. Muscle testing reveals 5/5 muscle strength of the upper extremity, and 5/5 of the lower extremity, except left quadriceps 4/5. Full range of motion in upper and lower extremities, except left knee restricted in extension 10 degrees.. ROM of spine is restricted.  DTR in the upper and lower extremity are present and symmetric 1+. No clonus is noted.  Patient arises from chair with difficulty. Wide based gait with cane and forward flexed spine.        Assessment & Plan:  1. Refill MS Contin 100 mg 1  p.o. q.12 h., 60 with no refill. She has been stable with these meds. Encouraged ongoing activity as tolerated, and continue exercise program for her knees and lower back. Also showed her some exercises for her neck. Encouraged patient to continue with water exercises or walking in the water.  2. Oxycodone 15 mg 1 p.o. q.6 h. p.r.n., 120 with no refills.  Encouraged patient to continue with not smoking, with the chantix.    Follow up in one month.

## 2012-04-22 ENCOUNTER — Telehealth: Payer: Self-pay

## 2012-04-22 ENCOUNTER — Other Ambulatory Visit: Payer: Self-pay

## 2012-04-22 DIAGNOSIS — G43109 Migraine with aura, not intractable, without status migrainosus: Secondary | ICD-10-CM

## 2012-04-22 DIAGNOSIS — M1712 Unilateral primary osteoarthritis, left knee: Secondary | ICD-10-CM

## 2012-04-22 DIAGNOSIS — M5136 Other intervertebral disc degeneration, lumbar region: Secondary | ICD-10-CM

## 2012-04-22 DIAGNOSIS — F329 Major depressive disorder, single episode, unspecified: Secondary | ICD-10-CM

## 2012-04-22 DIAGNOSIS — F32A Depression, unspecified: Secondary | ICD-10-CM

## 2012-04-22 DIAGNOSIS — M47816 Spondylosis without myelopathy or radiculopathy, lumbar region: Secondary | ICD-10-CM

## 2012-04-22 MED ORDER — CYCLOBENZAPRINE HCL 5 MG PO TABS: 5.0000 mg | ORAL_TABLET | Freq: Three times a day (TID) | ORAL | Status: DC | PRN

## 2012-04-22 NOTE — Telephone Encounter (Signed)
Flexeril needs prior auth per pharmacy.  Other medications patient can try include meloxicam, diclofenac.

## 2012-04-23 NOTE — Telephone Encounter (Signed)
Prior auth initiated and faxed.  Waiting on approval.

## 2012-05-07 ENCOUNTER — Encounter
Payer: Medicare Other | Attending: Physical Medicine and Rehabilitation | Admitting: Physical Medicine and Rehabilitation

## 2012-05-07 ENCOUNTER — Encounter: Payer: Self-pay | Admitting: Physical Medicine and Rehabilitation

## 2012-05-07 VITALS — BP 144/81 | HR 106 | Resp 16 | Ht 69.0 in | Wt 257.0 lb

## 2012-05-07 DIAGNOSIS — IMO0002 Reserved for concepts with insufficient information to code with codable children: Secondary | ICD-10-CM

## 2012-05-07 DIAGNOSIS — G8929 Other chronic pain: Secondary | ICD-10-CM

## 2012-05-07 DIAGNOSIS — F329 Major depressive disorder, single episode, unspecified: Secondary | ICD-10-CM

## 2012-05-07 DIAGNOSIS — M25569 Pain in unspecified knee: Secondary | ICD-10-CM

## 2012-05-07 DIAGNOSIS — M1710 Unilateral primary osteoarthritis, unspecified knee: Secondary | ICD-10-CM

## 2012-05-07 DIAGNOSIS — Z79899 Other long term (current) drug therapy: Secondary | ICD-10-CM | POA: Insufficient documentation

## 2012-05-07 DIAGNOSIS — M47817 Spondylosis without myelopathy or radiculopathy, lumbosacral region: Secondary | ICD-10-CM | POA: Insufficient documentation

## 2012-05-07 DIAGNOSIS — M25562 Pain in left knee: Secondary | ICD-10-CM

## 2012-05-07 DIAGNOSIS — M51379 Other intervertebral disc degeneration, lumbosacral region without mention of lumbar back pain or lower extremity pain: Secondary | ICD-10-CM | POA: Insufficient documentation

## 2012-05-07 DIAGNOSIS — G43109 Migraine with aura, not intractable, without status migrainosus: Secondary | ICD-10-CM | POA: Insufficient documentation

## 2012-05-07 DIAGNOSIS — Z96659 Presence of unspecified artificial knee joint: Secondary | ICD-10-CM | POA: Insufficient documentation

## 2012-05-07 DIAGNOSIS — M5137 Other intervertebral disc degeneration, lumbosacral region: Secondary | ICD-10-CM

## 2012-05-07 DIAGNOSIS — M5136 Other intervertebral disc degeneration, lumbar region: Secondary | ICD-10-CM

## 2012-05-07 DIAGNOSIS — F3289 Other specified depressive episodes: Secondary | ICD-10-CM | POA: Insufficient documentation

## 2012-05-07 DIAGNOSIS — F32A Depression, unspecified: Secondary | ICD-10-CM

## 2012-05-07 DIAGNOSIS — Z5181 Encounter for therapeutic drug level monitoring: Secondary | ICD-10-CM | POA: Insufficient documentation

## 2012-05-07 DIAGNOSIS — M1712 Unilateral primary osteoarthritis, left knee: Secondary | ICD-10-CM

## 2012-05-07 DIAGNOSIS — M47816 Spondylosis without myelopathy or radiculopathy, lumbar region: Secondary | ICD-10-CM

## 2012-05-07 MED ORDER — OXYCODONE HCL 15 MG PO TABS: 15.0000 mg | ORAL_TABLET | Freq: Four times a day (QID) | ORAL | Status: DC | PRN

## 2012-05-07 MED ORDER — MORPHINE SULFATE ER 100 MG PO TBCR: 100.0000 mg | EXTENDED_RELEASE_TABLET | Freq: Two times a day (BID) | ORAL | Status: DC

## 2012-05-07 NOTE — Progress Notes (Signed)
Subjective:    Patient ID: [name], female    DOB: April 12, 1960, 52 y.o.   MRN: [id number]  HPI The patient is a 52 year old female female, who presents withLBP and bilateral knee pain , worse on the left s/p TKR . The patient complains about moderate to severe pain in her low back, especially in her sacrum when sitting for a prolonged time. Patient denies any radiating symptoms. Patient reports, that her symptoms are stable, she has good and bad days. Applying heat/ ice, taking medications ,changing positions alleviate the symptoms. Prolonged sitting or standing aggrevates the symptoms. The patient grades his pain as a 7 /10.The patient states, that she is doing water exercises, and that she has not smoked for 14 weeks now !. Patient is here for a refill med check.   Pain Inventory Average Pain 7 Pain Right Now 7 My pain is constant, sharp, burning, dull, stabbing, tingling and aching  In the last 24 hours, has pain interfered with the following? General activity 9 Relation with others 9 Enjoyment of life 9 What TIME of day is your pain at its worst? all the time Sleep (in general) Poor  Pain is worse with: walking, bending, standing and some activites Pain improves with: rest, heat/ice, therapy/exercise, pacing activities, medication, TENS and injections Relief from Meds: 7  Mobility use a cane how many minutes can you walk? 5 ability to climb steps?  yes do you drive?  yes Do you have any goals in this area?  yes  Function not employed: date last employed 05 disabled: date disabled 07 I need assistance with the following:  bathing, meal prep, household duties and shopping Do you have any goals in this area?  yes  Neuro/Psych bladder control problems weakness numbness tremor tingling trouble walking spasms dizziness confusion depression anxiety suicidal thoughts  Prior Studies Any changes since last visit?  no  Physicians involved in your care Any  changes since last visit?  no   Family History  Problem Relation Age of Onset  . Depression Mother   . Hypertension Mother   . Hypertension Father   . Heart failure Father   . Diabetes Father   . Heart attack Father   . Colon cancer Neg Hx   . Esophageal cancer Neg Hx   . Stomach cancer Neg Hx   . Rectal cancer Neg Hx    History   Social History  . Marital Status: Married    Spouse Name: Sullivan Lone    Number of Children: 1  . Years of Education: some colle   Occupational History  . On disability    Social History Main Topics  . Smoking status: Former Smoker -- 0.50 packs/day for 39 years    Types: Cigarettes    Quit date: 02/03/2012  . Smokeless tobacco: Never Used     Comment: going to try e-sticks to quit  . Alcohol Use: No  . Drug Use: No  . Sexually Active: Not Currently -- Female partner(s)   Other Topics Concern  . None   Social History Narrative   8-10 cups of soda a day.  On disability. No regular exercise.     Past Surgical History  Procedure Laterality Date  . Tonsillectomy  09/1972  . Ulnar nerve entrapment  Feb 1995    left elbow  . Carpal tunnel release  07/1993    both hands  . Tubal ligation  Sept 1999  . Hemorroidectomy  July 2003  . Knee  arthroplasty  09/2001, 05/2003    left knee, chondromalasia  . Abdominal hysterectomy  May 2004  . Replacement total knee  Nov 2005    Left   . Thyroid lobectomy  01/2005    malignant area removed from right lobe  . Basel cell carcinoma  06/2005, 07/2005    nasal tip and reconstruction  . Knee arthroscopy  jan 2005    Right knee, tisse release, chrondromalacia  . Colonoscopy    . Thyroid surgery      thyroid lobe removal   Past Medical History  Diagnosis Date  . Chronic pain syndrome   . Intervertebral lumbar disc disorder with myelopathy, lumbar region   . Facet syndrome, lumbar   . Primary localized osteoarthrosis, lower leg   . Degeneration of lumbar or lumbosacral intervertebral disc   . Unspecified  musculoskeletal disorders and symptoms referable to neck     cervical/trapezius  . Migraine without aura, with intractable migraine, so stated, without mention of status migrainosus   . Depression   . Lumbosacral spondylosis without myelopathy   . Allergy   . Anxiety   . Cancer     cancerous nodules on thyroid  . Cancer     basal cell Cancer-nose  . COPD (chronic obstructive pulmonary disease)   . GERD (gastroesophageal reflux disease)   . Hyperlipidemia   . Thyroid disease     hypothyroid   BP 144/81  Pulse 106  Resp 16  Ht 5\' 9"  (1.753 m)  Wt 257 lb (116.574 kg)  BMI 37.93 kg/m2  SpO2 96%     Review of Systems  Constitutional: Positive for diaphoresis.  Respiratory: Positive for cough.   Gastrointestinal: Positive for nausea and constipation.  Genitourinary: Positive for difficulty urinating.  Neurological: Positive for dizziness, tremors, weakness and numbness.  Psychiatric/Behavioral: Positive for suicidal ideas.  All other systems reviewed and are negative.       Objective:   Physical Exam Constitutional: She is oriented to person, place, and time. She appears well-developed.  HENT:  Head: Normocephalic.  Eyes: Pupils are equal, round, and reactive to light.  Neck: Normal range of motion.  Neurological: She is alert and oriented to person, place, and time.  Skin: Skin is warm and dry.  Psychiatric: She has a normal mood and affect.  Symmetric normal motor tone is noted throughout. Normal muscle bulk. Muscle testing reveals 5/5 muscle strength of the upper extremity, and 5/5 of the lower extremity, except left quadriceps 4/5. Full range of motion in upper and lower extremities, except left knee restricted in extension 10 degrees.. ROM of spine is restricted.  DTR in the upper and lower extremity are present and symmetric 1+. No clonus is noted.  Patient arises from chair with difficulty. Wide based gait with cane and forward flexed spine.         Assessment & Plan:  1. Refill MS Contin 100 mg 1 p.o. q.12 h., 60 with no refill. She has been stable with these meds. Encouraged ongoing activity as tolerated, and continue exercise program for her knees and lower back. Also showed her some exercises for her neck. Encouraged patient to continue with water exercises or walking in the water.  2. Oxycodone 15 mg 1 p.o. q.6 h. p.r.n., 120 with no refills.  Encouraged patient to continue with not smoking, with the chantix.  Follow up in one month.

## 2012-05-07 NOTE — Patient Instructions (Signed)
Continue with your aquatic exercises 

## 2012-05-14 ENCOUNTER — Telehealth: Payer: Self-pay | Admitting: *Deleted

## 2012-05-14 NOTE — Telephone Encounter (Signed)
Requires office visit due to Dr. M's absence. 

## 2012-05-14 NOTE — Telephone Encounter (Signed)
Pt calls and was taking the Chantix and states " she fell off the wagon" and would like to start over on the med

## 2012-05-15 NOTE — Telephone Encounter (Signed)
Patient notified and will schedule office visit

## 2012-05-22 ENCOUNTER — Telehealth: Payer: Self-pay | Admitting: *Deleted

## 2012-05-22 MED ORDER — VARENICLINE TARTRATE 0.5 MG X 11 & 1 MG X 42 PO MISC: ORAL | Status: DC

## 2012-05-22 NOTE — Telephone Encounter (Signed)
Pt calls and sttaes they started the Chantix back in December but they fell off the wagon a few weeks ago and started back smoking due to stressors but they would like to try again. Request you send the Chantix start pack again. Barry Dienes, LPN

## 2012-05-22 NOTE — Telephone Encounter (Signed)
Okay to resend starter pack.

## 2012-05-26 ENCOUNTER — Other Ambulatory Visit: Payer: Self-pay | Admitting: Family Medicine

## 2012-06-05 ENCOUNTER — Encounter (HOSPITAL_BASED_OUTPATIENT_CLINIC_OR_DEPARTMENT_OTHER): Payer: Medicare Other | Admitting: Physical Medicine and Rehabilitation

## 2012-06-05 ENCOUNTER — Encounter: Payer: Self-pay | Admitting: Physical Medicine and Rehabilitation

## 2012-06-05 VITALS — BP 132/76 | HR 112 | Resp 14 | Ht 69.0 in | Wt 254.0 lb

## 2012-06-05 DIAGNOSIS — Z79899 Other long term (current) drug therapy: Secondary | ICD-10-CM

## 2012-06-05 DIAGNOSIS — F329 Major depressive disorder, single episode, unspecified: Secondary | ICD-10-CM

## 2012-06-05 DIAGNOSIS — M1710 Unilateral primary osteoarthritis, unspecified knee: Secondary | ICD-10-CM

## 2012-06-05 DIAGNOSIS — Z96659 Presence of unspecified artificial knee joint: Secondary | ICD-10-CM

## 2012-06-05 DIAGNOSIS — M1712 Unilateral primary osteoarthritis, left knee: Secondary | ICD-10-CM

## 2012-06-05 DIAGNOSIS — M47817 Spondylosis without myelopathy or radiculopathy, lumbosacral region: Secondary | ICD-10-CM

## 2012-06-05 DIAGNOSIS — M5136 Other intervertebral disc degeneration, lumbar region: Secondary | ICD-10-CM

## 2012-06-05 DIAGNOSIS — Z96652 Presence of left artificial knee joint: Secondary | ICD-10-CM

## 2012-06-05 DIAGNOSIS — Z5181 Encounter for therapeutic drug level monitoring: Secondary | ICD-10-CM

## 2012-06-05 DIAGNOSIS — M47816 Spondylosis without myelopathy or radiculopathy, lumbar region: Secondary | ICD-10-CM

## 2012-06-05 DIAGNOSIS — F32A Depression, unspecified: Secondary | ICD-10-CM

## 2012-06-05 DIAGNOSIS — G43109 Migraine with aura, not intractable, without status migrainosus: Secondary | ICD-10-CM

## 2012-06-05 DIAGNOSIS — M5137 Other intervertebral disc degeneration, lumbosacral region: Secondary | ICD-10-CM

## 2012-06-05 DIAGNOSIS — IMO0002 Reserved for concepts with insufficient information to code with codable children: Secondary | ICD-10-CM

## 2012-06-05 MED ORDER — OXYCODONE HCL 15 MG PO TABS: 15.0000 mg | ORAL_TABLET | Freq: Four times a day (QID) | ORAL | Status: DC | PRN

## 2012-06-05 MED ORDER — MORPHINE SULFATE ER 100 MG PO TBCR: 100.0000 mg | EXTENDED_RELEASE_TABLET | Freq: Two times a day (BID) | ORAL | Status: DC

## 2012-06-05 NOTE — Progress Notes (Signed)
Subjective:    Patient ID: [name], female    DOB: 1960-11-05, 52 y.o.   MRN: [id number]  HPI The patient is a 52 year old female female, who presents withLBP and bilateral knee pain , worse on the left s/p TKR . The patient complains about moderate to severe pain in her low back, especially in her sacrum when sitting for a prolonged time. Patient denies any radiating symptoms. Patient reports, that her symptoms are stable, she has good and bad days. Applying heat/ ice, taking medications ,changing positions alleviate the symptoms. Prolonged sitting or standing aggrevates the symptoms. The patient grades his pain as a 7 /10.The patient states, that she is doing water exercises. Patient is here for a refill med check.   Pain Inventory Average Pain 8 Pain Right Now 7 My pain is constant, sharp, burning, dull, stabbing, tingling and aching  In the last 24 hours, has pain interfered with the following? General activity 9 Relation with others 9 Enjoyment of life 9 What TIME of day is your pain at its worst? all the time Sleep (in general) Poor  Pain is worse with: walking, bending, standing and some activites Pain improves with: rest, heat/ice, therapy/exercise, medication, TENS and injections Relief from Meds: 7  Mobility use a cane how many minutes can you walk? 5-10 ability to climb steps?  yes do you drive?  yes Do you have any goals in this area?  yes  Function disabled: date disabled 07 I need assistance with the following:  bathing, meal prep, household duties and shopping Do you have any goals in this area?  yes  Neuro/Psych bladder control problems weakness numbness tremor tingling trouble walking spasms dizziness confusion depression anxiety suicidal thoughts  Prior Studies Any changes since last visit?  no  Physicians involved in your care Any changes since last visit?  no   Family History  Problem Relation Age of Onset  . Depression Mother    . Hypertension Mother   . Hypertension Father   . Heart failure Father   . Diabetes Father   . Heart attack Father   . Colon cancer Neg Hx   . Esophageal cancer Neg Hx   . Stomach cancer Neg Hx   . Rectal cancer Neg Hx    History   Social History  . Marital Status: Married    Spouse Name: Sullivan Lone    Number of Children: 1  . Years of Education: some colle   Occupational History  . On disability    Social History Main Topics  . Smoking status: Former Smoker -- 0.50 packs/day for 39 years    Types: Cigarettes    Quit date: 02/03/2012  . Smokeless tobacco: Never Used     Comment: going to try e-sticks to quit  . Alcohol Use: No  . Drug Use: No  . Sexually Active: Not Currently -- Female partner(s)   Other Topics Concern  . None   Social History Narrative   8-10 cups of soda a day.  On disability. No regular exercise.     Past Surgical History  Procedure Laterality Date  . Tonsillectomy  09/1972  . Ulnar nerve entrapment  Feb 1995    left elbow  . Carpal tunnel release  07/1993    both hands  . Tubal ligation  Sept 1999  . Hemorroidectomy  July 2003  . Knee arthroplasty  09/2001, 05/2003    left knee, chondromalasia  . Abdominal hysterectomy  May 2004  .  Replacement total knee  Nov 2005    Left   . Thyroid lobectomy  01/2005    malignant area removed from right lobe  . Basel cell carcinoma  06/2005, 07/2005    nasal tip and reconstruction  . Knee arthroscopy  jan 2005    Right knee, tisse release, chrondromalacia  . Colonoscopy    . Thyroid surgery      thyroid lobe removal   Past Medical History  Diagnosis Date  . Chronic pain syndrome   . Intervertebral lumbar disc disorder with myelopathy, lumbar region   . Facet syndrome, lumbar   . Primary localized osteoarthrosis, lower leg   . Degeneration of lumbar or lumbosacral intervertebral disc   . Unspecified musculoskeletal disorders and symptoms referable to neck     cervical/trapezius  . Migraine without  aura, with intractable migraine, so stated, without mention of status migrainosus   . Depression   . Lumbosacral spondylosis without myelopathy   . Allergy   . Anxiety   . Cancer     cancerous nodules on thyroid  . Cancer     basal cell Cancer-nose  . COPD (chronic obstructive pulmonary disease)   . GERD (gastroesophageal reflux disease)   . Hyperlipidemia   . Thyroid disease     hypothyroid   BP 132/76  Pulse 112  Resp 14  Ht 5\' 9"  (1.753 m)  Wt 254 lb (115.214 kg)  BMI 37.49 kg/m2  SpO2 94%     Review of Systems  Constitutional: Positive for unexpected weight change.  Cardiovascular: Positive for leg swelling.  Gastrointestinal: Positive for vomiting, diarrhea and constipation.  Genitourinary: Positive for difficulty urinating.  Musculoskeletal: Positive for back pain and gait problem.  Neurological: Positive for dizziness, tremors, weakness and numbness.  Psychiatric/Behavioral: Positive for confusion and dysphoric mood. The patient is nervous/anxious.   All other systems reviewed and are negative.       Objective:   Physical Exam Constitutional: She is oriented to person, place, and time. She appears well-developed.  HENT:  Head: Normocephalic.  Eyes: Pupils are equal, round, and reactive to light.  Neck: Normal range of motion.  Neurological: She is alert and oriented to person, place, and time.  Skin: Skin is warm and dry.  Psychiatric: She has a normal mood and affect.  Symmetric normal motor tone is noted throughout. Normal muscle bulk. Muscle testing reveals 5/5 muscle strength of the upper extremity, and 5/5 of the lower extremity, except left quadriceps 4/5. Full range of motion in upper and lower extremities, except left knee restricted in extension 10 degrees.. ROM of spine is restricted.  DTR in the upper and lower extremity are present and symmetric 1+. No clonus is noted.  Patient arises from chair with difficulty. Wide based gait with cane and  forward flexed spine.        Assessment & Plan:  This is a 52 year old female with 1.Lumbar spondylosis 2.Hx of TKR on the left 3.Migraine 4.Myalgia   Plan 1. Refill MS Contin 100 mg 1 p.o. q.12 h., 60 with no refill. She has been stable with these meds. Encouraged ongoing activity as tolerated, and continue exercise program for her knees and lower back. Also showed her some exercises for her neck. Encouraged patient to continue with water exercises or walking in the water.  2. Oxycodone 15 mg 1 p.o. q.6 h. p.r.n., 120 with no refills.  Encouraged patient to continue with not smoking, with the chantix.  Follow up in  one month.

## 2012-06-05 NOTE — Addendum Note (Signed)
Addended by: [name] on: 06/05/2012 11:56 AM   Modules accepted: Orders

## 2012-06-05 NOTE — Patient Instructions (Signed)
Continue with your aquatic exercises, continue with your smoking cessation.

## 2012-06-17 ENCOUNTER — Ambulatory Visit (INDEPENDENT_AMBULATORY_CARE_PROVIDER_SITE_OTHER): Payer: Medicare Other | Admitting: Family Medicine

## 2012-06-17 ENCOUNTER — Encounter: Payer: Self-pay | Admitting: Family Medicine

## 2012-06-17 VITALS — BP 146/90 | HR 113 | Ht 69.0 in | Wt 256.0 lb

## 2012-06-17 DIAGNOSIS — R599 Enlarged lymph nodes, unspecified: Secondary | ICD-10-CM

## 2012-06-17 DIAGNOSIS — R591 Generalized enlarged lymph nodes: Secondary | ICD-10-CM

## 2012-06-17 DIAGNOSIS — E039 Hypothyroidism, unspecified: Secondary | ICD-10-CM

## 2012-06-17 NOTE — Progress Notes (Signed)
  Subjective:    Patient ID: [name], female    DOB: 07/24/1960, 52 y.o.   MRN: [id number]  HPI  Home health noticed swollen LN under her ear on the right. Nontender or painful. Has been there for 3 months but no ST or dysphagia.  Says sizes goes up an down. No ear pain pressure.   No dental pain. Needs some dental work but has lost of fillings but no pain ow swelling or abscess.    Hypothyroid - Due tor recheck levels.  No weight, skin, or hair changes.      Review of Systems     Objective:   Physical Exam  Constitutional: She is oriented to person, place, and time. She appears well-developed and well-nourished.  HENT:  Head: Normocephalic and atraumatic.  Right Ear: External ear normal.  Left Ear: External ear normal.  Nose: Nose normal.  Mouth/Throat: Oropharynx is clear and moist.  TMs and canals are clear.   Eyes: Conjunctivae and EOM are normal. Pupils are equal, round, and reactive to light.  Neck: Neck supple. No thyromegaly present.  She actually has a very small lymph node over the upper anterior cervical neck on the right. It is soft and mobile. It's less than half a centimeter. The area that she was concerned about was actually almost over the lower jaw. I was unable to feel any distinct lymph nodes. There is a little bit more soft tissue there but this could just be anatomical as well as maybe a little bit of swelling. She has for dentition which could be contributing.  Cardiovascular: Normal rate, regular rhythm and normal heart sounds.   Pulmonary/Chest: Effort normal and breath sounds normal. She has no wheezes.  Lymphadenopathy:    She has no cervical adenopathy.  Neurological: She is alert and oriented to person, place, and time.  Skin: Skin is warm and dry.  Psychiatric: She has a normal mood and affect. Her behavior is normal.          Assessment & Plan:  Right lymphadenopathy - will check CBC w/ diff today. Gave reassurance. Mointor for change  in size, increased firmness.    Hypothyroid - Recheck TSH today.   Blood pressures are slightly elevated today this is unusual for her. We will keep an eye on it. She is followed closely with her pain management Dr.

## 2012-06-17 NOTE — Patient Instructions (Signed)
And you can check with your insurance to see if they will cover removal of a seborrheic keratosis.

## 2012-06-18 ENCOUNTER — Encounter: Payer: Self-pay | Admitting: Family Medicine

## 2012-06-18 LAB — CBC WITH DIFFERENTIAL/PLATELET
Basophils Absolute: 0 10*3/uL (ref 0.0–0.1)
Eosinophils Absolute: 0.2 10*3/uL (ref 0.0–0.7)
Eosinophils Relative: 2 % (ref 0–5)
Lymphocytes Relative: 41 % (ref 12–46)
MCV: 89.3 fL (ref 78.0–100.0)
Neutrophils Relative %: 48 % (ref 43–77)
Platelets: 350 10*3/uL (ref 150–400)
RBC: 5.21 MIL/uL — ABNORMAL HIGH (ref 3.87–5.11)
RDW: 12.5 % (ref 11.5–15.5)
WBC: 10.4 10*3/uL (ref 4.0–10.5)

## 2012-06-18 LAB — TSH: TSH: 4.201 u[IU]/mL (ref 0.350–4.500)

## 2012-06-27 ENCOUNTER — Telehealth: Payer: Self-pay | Admitting: *Deleted

## 2012-06-27 NOTE — Telephone Encounter (Signed)
Needs rx for continue month boxes of Chantix sent to [facility]. Pt completed the starter pack

## 2012-06-28 ENCOUNTER — Other Ambulatory Visit: Payer: Self-pay | Admitting: Family Medicine

## 2012-07-02 ENCOUNTER — Telehealth: Payer: Self-pay | Admitting: Family Medicine

## 2012-07-02 NOTE — Telephone Encounter (Signed)
Due for screening choleserterol.  Needs lipid panel and CMP.  WE can order and she can come fasting any time.

## 2012-07-03 NOTE — Telephone Encounter (Signed)
Pt called and lvm for pt and his wife informing them that they are overdue for labs and if he needed a referral to have an eye exam done or if he is currently seeing someone he will need to schedule an appt. Pt's asked to return call for f/u.Loralee Pacas [place]

## 2012-07-04 NOTE — Telephone Encounter (Signed)
Pt will call and schedule an appt next week.[name]

## 2012-07-05 ENCOUNTER — Encounter
Payer: Medicare Other | Attending: Physical Medicine and Rehabilitation | Admitting: Physical Medicine and Rehabilitation

## 2012-07-05 ENCOUNTER — Encounter: Payer: Self-pay | Admitting: Physical Medicine and Rehabilitation

## 2012-07-05 VITALS — BP 123/71 | HR 120 | Resp 17 | Ht 69.0 in | Wt 254.0 lb

## 2012-07-05 DIAGNOSIS — Z96659 Presence of unspecified artificial knee joint: Secondary | ICD-10-CM | POA: Insufficient documentation

## 2012-07-05 DIAGNOSIS — M47812 Spondylosis without myelopathy or radiculopathy, cervical region: Secondary | ICD-10-CM | POA: Insufficient documentation

## 2012-07-05 DIAGNOSIS — F32A Depression, unspecified: Secondary | ICD-10-CM

## 2012-07-05 DIAGNOSIS — F329 Major depressive disorder, single episode, unspecified: Secondary | ICD-10-CM

## 2012-07-05 DIAGNOSIS — M1710 Unilateral primary osteoarthritis, unspecified knee: Secondary | ICD-10-CM

## 2012-07-05 DIAGNOSIS — M25562 Pain in left knee: Secondary | ICD-10-CM

## 2012-07-05 DIAGNOSIS — Z96652 Presence of left artificial knee joint: Secondary | ICD-10-CM

## 2012-07-05 DIAGNOSIS — M1712 Unilateral primary osteoarthritis, left knee: Secondary | ICD-10-CM

## 2012-07-05 DIAGNOSIS — G43909 Migraine, unspecified, not intractable, without status migrainosus: Secondary | ICD-10-CM | POA: Insufficient documentation

## 2012-07-05 DIAGNOSIS — M47816 Spondylosis without myelopathy or radiculopathy, lumbar region: Secondary | ICD-10-CM

## 2012-07-05 DIAGNOSIS — M25569 Pain in unspecified knee: Secondary | ICD-10-CM | POA: Insufficient documentation

## 2012-07-05 DIAGNOSIS — M47817 Spondylosis without myelopathy or radiculopathy, lumbosacral region: Secondary | ICD-10-CM

## 2012-07-05 DIAGNOSIS — IMO0002 Reserved for concepts with insufficient information to code with codable children: Secondary | ICD-10-CM

## 2012-07-05 DIAGNOSIS — G43109 Migraine with aura, not intractable, without status migrainosus: Secondary | ICD-10-CM

## 2012-07-05 DIAGNOSIS — M5137 Other intervertebral disc degeneration, lumbosacral region: Secondary | ICD-10-CM

## 2012-07-05 DIAGNOSIS — M5136 Other intervertebral disc degeneration, lumbar region: Secondary | ICD-10-CM

## 2012-07-05 DIAGNOSIS — IMO0001 Reserved for inherently not codable concepts without codable children: Secondary | ICD-10-CM | POA: Insufficient documentation

## 2012-07-05 MED ORDER — OXYCODONE HCL 15 MG PO TABS: 15.0000 mg | ORAL_TABLET | Freq: Four times a day (QID) | ORAL | Status: DC | PRN

## 2012-07-05 MED ORDER — MORPHINE SULFATE ER 100 MG PO TBCR: 100.0000 mg | EXTENDED_RELEASE_TABLET | Freq: Two times a day (BID) | ORAL | Status: DC

## 2012-07-05 MED ORDER — GABAPENTIN 600 MG PO TABS: 600.0000 mg | ORAL_TABLET | Freq: Four times a day (QID) | ORAL | Status: DC

## 2012-07-05 NOTE — Progress Notes (Signed)
Subjective:    Patient ID: [name], female    DOB: April 22, 1960, 52 y.o.   MRN: [id number]  HPI The patient is a 52 year old female female, who presents withLBP and bilateral knee pain , worse on the left s/p TKR . The patient complains about moderate to severe pain in her low back, especially in her sacrum when sitting for a prolonged time. Patient denies any radiating symptoms. Patient reports, that her symptoms are stable, she has good and bad days. Applying heat/ ice, taking medications ,changing positions alleviate the symptoms. Prolonged sitting or standing aggrevates the symptoms. The patient grades his pain as a 7 /10.The patient states, that she is doing water exercises. Patient is here for a refill med check.   Pain Inventory Average Pain 7 Pain Right Now 7 My pain is constant, sharp, burning, dull, stabbing, tingling and aching  In the last 24 hours, has pain interfered with the following? General activity 9 Relation with others 9 Enjoyment of life 9 What TIME of day is your pain at its worst? na Sleep (in general) na  Pain is worse with: walking, sitting, standing and some activites Pain improves with: rest, heat/ice, therapy/exercise, medication, TENS and injections Relief from Meds: 6  Mobility use a cane how many minutes can you walk? 5 ability to climb steps?  yes do you drive?  yes  Function disabled: date disabled 7-07 I need assistance with the following:  bathing, meal prep, household duties and shopping Do you have any goals in this area?  yes  Neuro/Psych bladder control problems weakness numbness tremor tingling trouble walking spasms dizziness confusion depression anxiety  Prior Studies Any changes since last visit?  no  Physicians involved in your care Any changes since last visit?  no   Family History  Problem Relation Age of Onset  . Depression Mother   . Hypertension Mother   . Hypertension Father   . Heart failure Father    . Diabetes Father   . Heart attack Father   . Colon cancer Neg Hx   . Esophageal cancer Neg Hx   . Stomach cancer Neg Hx   . Rectal cancer Neg Hx    History   Social History  . Marital Status: Married    Spouse Name: Sullivan Lone    Number of Children: 1  . Years of Education: some colle   Occupational History  . On disability    Social History Main Topics  . Smoking status: Former Smoker -- 0.50 packs/day for 39 years    Types: Cigarettes    Quit date: 02/03/2012  . Smokeless tobacco: Never Used     Comment: going to try e-sticks to quit  . Alcohol Use: No  . Drug Use: No  . Sexually Active: Not Currently -- Female partner(s)   Other Topics Concern  . None   Social History Narrative   8-10 cups of soda a day.  On disability. No regular exercise.     Past Surgical History  Procedure Laterality Date  . Tonsillectomy  09/1972  . Ulnar nerve entrapment  Feb 1995    left elbow  . Carpal tunnel release  07/1993    both hands  . Tubal ligation  Sept 1999  . Hemorroidectomy  July 2003  . Knee arthroplasty  09/2001, 05/2003    left knee, chondromalasia  . Abdominal hysterectomy  May 2004  . Replacement total knee  Nov 2005    Left   . Thyroid  lobectomy  01/2005    malignant area removed from right lobe  . Basel cell carcinoma  06/2005, 07/2005    nasal tip and reconstruction  . Knee arthroscopy  jan 2005    Right knee, tisse release, chrondromalacia  . Colonoscopy    . Thyroid surgery      thyroid lobe removal   Past Medical History  Diagnosis Date  . Chronic pain syndrome   . Intervertebral lumbar disc disorder with myelopathy, lumbar region   . Facet syndrome, lumbar   . Primary localized osteoarthrosis, lower leg   . Degeneration of lumbar or lumbosacral intervertebral disc   . Unspecified musculoskeletal disorders and symptoms referable to neck     cervical/trapezius  . Migraine without aura, with intractable migraine, so stated, without mention of status  migrainosus   . Depression   . Lumbosacral spondylosis without myelopathy   . Allergy   . Anxiety   . Cancer     cancerous nodules on thyroid  . Cancer     basal cell Cancer-nose  . COPD (chronic obstructive pulmonary disease)   . GERD (gastroesophageal reflux disease)   . Hyperlipidemia   . Thyroid disease     hypothyroid   BP 123/71  Pulse 120  Resp 17  Ht 5\' 9"  (1.753 m)  Wt 254 lb (115.214 kg)  BMI 37.49 kg/m2  SpO2 96%      Review of Systems  Constitutional: Positive for diaphoresis, appetite change and unexpected weight change.  Cardiovascular: Positive for leg swelling.  Gastrointestinal: Positive for nausea, vomiting, abdominal pain and constipation.  Musculoskeletal: Positive for gait problem.  Neurological: Positive for dizziness, tremors, weakness and numbness.       Tingling, and spasms   Psychiatric/Behavioral: Positive for confusion and dysphoric mood.  All other systems reviewed and are negative.       Objective:   Physical Exam  Constitutional: She is oriented to person, place, and time. She appears well-developed.  HENT:  Head: Normocephalic.  Eyes: Pupils are equal, round, and reactive to light.  Neck: Normal range of motion.  Neurological: She is alert and oriented to person, place, and time.  Skin: Skin is warm and dry.  Psychiatric: She has a normal mood and affect.  Symmetric normal motor tone is noted throughout. Normal muscle bulk. Muscle testing reveals 5/5 muscle strength of the upper extremity, and 5/5 of the lower extremity, except left quadriceps 4/5. Full range of motion in upper and lower extremities, except left knee restricted in extension 10 degrees.. ROM of spine is restricted.  DTR in the upper and lower extremity are present and symmetric 1+. No clonus is noted.  Patient arises from chair with difficulty. Wide based gait with cane and forward flexed spine.       Assessment & Plan:  This is a 51 year old female with   1.Lumbar spondylosis  2.Hx of TKR on the left  3.Migraine  4.Myalgia  Plan  1. Refill MS Contin 100 mg 1 p.o. q.12 h., 60 with no refill. She has been stable with these meds. Encouraged ongoing activity as tolerated, and continue exercise program for her knees and lower back. Also showed her some exercises for her neck. Encouraged patient to continue with water exercises or walking in the water.  2. Oxycodone 15 mg 1 p.o. q.6 h. p.r.n., 120 with no refills.  Encouraged patient to continue with not smoking, with the chantix.  Follow up in one month.

## 2012-07-05 NOTE — Patient Instructions (Signed)
Continue with your aquatic exercising 

## 2012-08-02 ENCOUNTER — Encounter: Payer: Self-pay | Admitting: Physical Medicine & Rehabilitation

## 2012-08-02 ENCOUNTER — Encounter: Payer: Medicare Other | Attending: Physical Medicine and Rehabilitation | Admitting: Physical Medicine & Rehabilitation

## 2012-08-02 VITALS — BP 156/76 | HR 111 | Resp 17 | Ht 69.0 in | Wt 255.0 lb

## 2012-08-02 DIAGNOSIS — F329 Major depressive disorder, single episode, unspecified: Secondary | ICD-10-CM

## 2012-08-02 DIAGNOSIS — M5136 Other intervertebral disc degeneration, lumbar region: Secondary | ICD-10-CM

## 2012-08-02 DIAGNOSIS — M1712 Unilateral primary osteoarthritis, left knee: Secondary | ICD-10-CM

## 2012-08-02 DIAGNOSIS — G90529 Complex regional pain syndrome I of unspecified lower limb: Secondary | ICD-10-CM

## 2012-08-02 DIAGNOSIS — M51379 Other intervertebral disc degeneration, lumbosacral region without mention of lumbar back pain or lower extremity pain: Secondary | ICD-10-CM | POA: Insufficient documentation

## 2012-08-02 DIAGNOSIS — IMO0001 Reserved for inherently not codable concepts without codable children: Secondary | ICD-10-CM

## 2012-08-02 DIAGNOSIS — M7918 Myalgia, other site: Secondary | ICD-10-CM

## 2012-08-02 DIAGNOSIS — IMO0002 Reserved for concepts with insufficient information to code with codable children: Secondary | ICD-10-CM | POA: Insufficient documentation

## 2012-08-02 DIAGNOSIS — M47817 Spondylosis without myelopathy or radiculopathy, lumbosacral region: Secondary | ICD-10-CM

## 2012-08-02 DIAGNOSIS — M47816 Spondylosis without myelopathy or radiculopathy, lumbar region: Secondary | ICD-10-CM

## 2012-08-02 DIAGNOSIS — G90522 Complex regional pain syndrome I of left lower limb: Secondary | ICD-10-CM | POA: Insufficient documentation

## 2012-08-02 DIAGNOSIS — M5137 Other intervertebral disc degeneration, lumbosacral region: Secondary | ICD-10-CM

## 2012-08-02 DIAGNOSIS — F3289 Other specified depressive episodes: Secondary | ICD-10-CM | POA: Insufficient documentation

## 2012-08-02 DIAGNOSIS — F32A Depression, unspecified: Secondary | ICD-10-CM

## 2012-08-02 DIAGNOSIS — G43109 Migraine with aura, not intractable, without status migrainosus: Secondary | ICD-10-CM

## 2012-08-02 DIAGNOSIS — M1710 Unilateral primary osteoarthritis, unspecified knee: Secondary | ICD-10-CM | POA: Insufficient documentation

## 2012-08-02 MED ORDER — MORPHINE SULFATE ER 100 MG PO TBCR: 100.0000 mg | EXTENDED_RELEASE_TABLET | Freq: Two times a day (BID) | ORAL | Status: DC

## 2012-08-02 MED ORDER — OXYCODONE HCL 15 MG PO TABS: 15.0000 mg | ORAL_TABLET | Freq: Four times a day (QID) | ORAL | Status: DC | PRN

## 2012-08-02 MED ORDER — GABAPENTIN 800 MG PO TABS: 800.0000 mg | ORAL_TABLET | Freq: Four times a day (QID) | ORAL | Status: DC

## 2012-08-02 NOTE — Progress Notes (Signed)
Subjective:    Patient ID: [name], female    DOB: 01-29-1961, 52 y.o.   MRN: [id number]  HPI  [name] is back regarding her chronic pain. She generally has been doing fairly well. She tries to get to the pool when she can. She has a headache today. She feels hyper-sensitive to pain today. This happens every 3 weeks or so where she feels pins and needles particularly at the left knee/leg.   She continues on her gabapentin but is not taking it 4 x a day, because she forgot it was rx'ed that way. She feels that it helps her tingling,pain. She is on the pamelor at bedtime as prescribed.  She remains as active as possible but is limited by her pain in general.   No recent changes with her narcotics have been recently made. She is tolerating them well and feels that they are effective.   Pain Inventory Average Pain 7 Pain Right Now 8 My pain is constant, sharp, burning, dull, stabbing, tingling and aching  In the last 24 hours, has pain interfered with the following? General activity 9 Relation with others 9 Enjoyment of life 9 What TIME of day is your pain at its worst? na Sleep (in general) Poor  Pain is worse with: walking, bending, standing and some activites Pain improves with: rest, heat/ice, therapy/exercise, medication, TENS and injections Relief from Meds: 6  Mobility use a cane how many minutes can you walk? 5-10 ability to climb steps?  yes do you drive?  yes Do you have any goals in this area?  yes  Function disabled: date disabled 08-2005 I need assistance with the following:  bathing, meal prep, household duties and shopping Do you have any goals in this area?  yes  Neuro/Psych bladder control problems weakness tremor tingling trouble walking spasms dizziness confusion depression anxiety suicidal thoughts  Prior Studies Any changes since last visit?  no  Physicians involved in your care Any changes since last visit?  no   Family  History  Problem Relation Age of Onset  . Depression Mother   . Hypertension Mother   . Hypertension Father   . Heart failure Father   . Diabetes Father   . Heart attack Father   . Colon cancer Neg Hx   . Esophageal cancer Neg Hx   . Stomach cancer Neg Hx   . Rectal cancer Neg Hx    History   Social History  . Marital Status: Married    Spouse Name: Sullivan Lone    Number of Children: 1  . Years of Education: some colle   Occupational History  . On disability    Social History Main Topics  . Smoking status: Former Smoker -- 0.50 packs/day for 39 years    Types: Cigarettes    Quit date: 02/03/2012  . Smokeless tobacco: Never Used     Comment: going to try e-sticks to quit  . Alcohol Use: No  . Drug Use: No  . Sexually Active: Not Currently -- Female partner(s)   Other Topics Concern  . None   Social History Narrative   8-10 cups of soda a day.  On disability. No regular exercise.     Past Surgical History  Procedure Laterality Date  . Tonsillectomy  09/1972  . Ulnar nerve entrapment  Feb 1995    left elbow  . Carpal tunnel release  07/1993    both hands  . Tubal ligation  Sept 1999  . Hemorroidectomy  July  2003  . Knee arthroplasty  09/2001, 05/2003    left knee, chondromalasia  . Abdominal hysterectomy  May 2004  . Replacement total knee  Nov 2005    Left   . Thyroid lobectomy  01/2005    malignant area removed from right lobe  . Basel cell carcinoma  06/2005, 07/2005    nasal tip and reconstruction  . Knee arthroscopy  jan 2005    Right knee, tisse release, chrondromalacia  . Colonoscopy    . Thyroid surgery      thyroid lobe removal   Past Medical History  Diagnosis Date  . Chronic pain syndrome   . Intervertebral lumbar disc disorder with myelopathy, lumbar region   . Facet syndrome, lumbar   . Primary localized osteoarthrosis, lower leg   . Degeneration of lumbar or lumbosacral intervertebral disc   . Unspecified musculoskeletal disorders and symptoms  referable to neck     cervical/trapezius  . Migraine without aura, with intractable migraine, so stated, without mention of status migrainosus   . Depression   . Lumbosacral spondylosis without myelopathy   . Allergy   . Anxiety   . Cancer     cancerous nodules on thyroid  . Cancer     basal cell Cancer-nose  . COPD (chronic obstructive pulmonary disease)   . GERD (gastroesophageal reflux disease)   . Hyperlipidemia   . Thyroid disease     hypothyroid   BP 156/76  Pulse 111  Resp 17  Ht 5\' 9"  (1.753 m)  Wt 255 lb (115.667 kg)  BMI 37.64 kg/m2  SpO2 97%     Review of Systems  Constitutional: Positive for diaphoresis.  Cardiovascular: Positive for leg swelling.  Gastrointestinal: Positive for nausea, vomiting and constipation.  Musculoskeletal: Positive for gait problem.  Neurological: Positive for dizziness, tremors and weakness.       Tingling,spasms  Psychiatric/Behavioral: Positive for suicidal ideas, confusion, dysphoric mood and agitation.  All other systems reviewed and are negative.       Objective:   Physical Exam  Constitutional: She is oriented to person, place, and time. She appears well-developed and well-nourished.  overweight  HENT:  Head: Normocephalic.  Eyes: Conjunctivae and EOM are normal. Pupils are equal, round, and reactive to light.  Neck: Normal range of motion.  Cardiovascular: Normal rate and regular rhythm.  Pulmonary/Chest: Effort normal and breath sounds normal.  Abdominal: Soft.  Musculoskeletal: Normal range of motion.  Left knee with warmth and swelling with pain with resisted flex and ext. She is wearing her OA knee Brace. She can flex and touch her toes with pain. She has pain with extension past 20-30 degrees. She has some discomfort with extension from seated position.  She ambulates using a straight cane to off load the left knee. Continued pain with WB on the left is noted. The left leg/thigh is almost allodynic to touch.  Color and temperature are normal.  Trigger point noted at the inferior/medial edge of the left scapula.  Neurological: She is alert and oriented to person, place, and time. She has normal strength. No cranial nerve deficit or sensory deficit.  Skin: Skin is warm.  Psychiatric: She has a normal mood and affect. Her behavior is normal. Judgment and thought content normal.   Assessment & Plan:   ASSESSMENT:  1. Chronic low back pain.  2. Facet arthropathy.  3. Degenerative disk disease.  4. Osteoarthritis of the knees.  5. Migraine headaches with aura  6. Myofascial pain 7. RSD  left lower extremity   PLAN:  1. Refilled MS Contin 100 mg 1 p.o. q.12 h., 60 with no refill. I gave her second scripts for next month. She has been stable with these meds. Encouraged ongoing activity as tolerated.  2. Oxycodone 15 mg 1 p.o. q.6 h. p.r.n., 120 with no refill with a script for next month.  3. She is having what it appears to be ongoing neuropathic pain which is flaring every few weeks or so in the left leg. The gabapentin helps. Will get her back up to the prescribed dose for one month then titrate to 800mg  qid thereafter. Also can consider increasing her pamelor to 50mg  qhs---she will call me if she would like to pursue that.  4. Her questions were encouraged and answered. She will follow up in a  Month with my PA. I did refill her neurontin, pamelor, flexeril, and celexa today

## 2012-08-02 NOTE — Patient Instructions (Signed)
INCREASE YOUR GABAPENTIN TO 600MG  4X DAILY---CONTINUE UNTIL BOTTLE IS EMPTY AND FILL THE NEW PRESCRIPTION I GAVE YOU TODAY.   IF YOU WOULD LIKE, IN A FEW WEEKS, INCREASE THE NORTRIPTYLINE TO 50MG  AT BEDTIME TO SEE IF IT HELPS YOUR PAIN.     CALL ME WITH ANY PROBLEMS OR QUESTIONS (#[phone]).  HAVE A GOOD DAY

## 2012-08-12 ENCOUNTER — Telehealth: Payer: Self-pay

## 2012-08-12 MED ORDER — NORTRIPTYLINE HCL 50 MG PO CAPS: 50.0000 mg | ORAL_CAPSULE | Freq: Every day | ORAL | Status: DC

## 2012-08-12 NOTE — Telephone Encounter (Signed)
Patient says doubling her nortriptyline has helped.  She request a refill since she has double her old script.  Per note new script for nortriptyline 50mg  sent to pharmacy.  Patient aware.

## 2012-08-20 ENCOUNTER — Other Ambulatory Visit: Payer: Self-pay | Admitting: Family Medicine

## 2012-08-26 ENCOUNTER — Other Ambulatory Visit: Payer: Self-pay

## 2012-08-26 DIAGNOSIS — M1712 Unilateral primary osteoarthritis, left knee: Secondary | ICD-10-CM

## 2012-08-26 DIAGNOSIS — F32A Depression, unspecified: Secondary | ICD-10-CM

## 2012-08-26 DIAGNOSIS — F329 Major depressive disorder, single episode, unspecified: Secondary | ICD-10-CM

## 2012-08-26 DIAGNOSIS — G43109 Migraine with aura, not intractable, without status migrainosus: Secondary | ICD-10-CM

## 2012-08-26 DIAGNOSIS — M51369 Other intervertebral disc degeneration, lumbar region without mention of lumbar back pain or lower extremity pain: Secondary | ICD-10-CM

## 2012-08-26 DIAGNOSIS — M5136 Other intervertebral disc degeneration, lumbar region: Secondary | ICD-10-CM

## 2012-08-26 DIAGNOSIS — M47816 Spondylosis without myelopathy or radiculopathy, lumbar region: Secondary | ICD-10-CM

## 2012-08-26 MED ORDER — CYCLOBENZAPRINE HCL 5 MG PO TABS: 5.0000 mg | ORAL_TABLET | Freq: Three times a day (TID) | ORAL | Status: DC | PRN

## 2012-08-29 ENCOUNTER — Encounter
Payer: Medicare Other | Attending: Physical Medicine and Rehabilitation | Admitting: Physical Medicine and Rehabilitation

## 2012-08-29 ENCOUNTER — Encounter: Payer: Self-pay | Admitting: Physical Medicine and Rehabilitation

## 2012-08-29 VITALS — BP 151/73 | HR 130 | Resp 14 | Ht 69.0 in | Wt 260.0 lb

## 2012-08-29 DIAGNOSIS — F3289 Other specified depressive episodes: Secondary | ICD-10-CM

## 2012-08-29 DIAGNOSIS — M1712 Unilateral primary osteoarthritis, left knee: Secondary | ICD-10-CM

## 2012-08-29 DIAGNOSIS — G90529 Complex regional pain syndrome I of unspecified lower limb: Secondary | ICD-10-CM | POA: Insufficient documentation

## 2012-08-29 DIAGNOSIS — G43109 Migraine with aura, not intractable, without status migrainosus: Secondary | ICD-10-CM

## 2012-08-29 DIAGNOSIS — F32A Depression, unspecified: Secondary | ICD-10-CM

## 2012-08-29 DIAGNOSIS — IMO0002 Reserved for concepts with insufficient information to code with codable children: Secondary | ICD-10-CM

## 2012-08-29 DIAGNOSIS — M51379 Other intervertebral disc degeneration, lumbosacral region without mention of lumbar back pain or lower extremity pain: Secondary | ICD-10-CM

## 2012-08-29 DIAGNOSIS — M1710 Unilateral primary osteoarthritis, unspecified knee: Secondary | ICD-10-CM | POA: Insufficient documentation

## 2012-08-29 DIAGNOSIS — F329 Major depressive disorder, single episode, unspecified: Secondary | ICD-10-CM

## 2012-08-29 DIAGNOSIS — Z79899 Other long term (current) drug therapy: Secondary | ICD-10-CM | POA: Insufficient documentation

## 2012-08-29 DIAGNOSIS — G8929 Other chronic pain: Secondary | ICD-10-CM | POA: Insufficient documentation

## 2012-08-29 DIAGNOSIS — M5136 Other intervertebral disc degeneration, lumbar region: Secondary | ICD-10-CM

## 2012-08-29 DIAGNOSIS — M47817 Spondylosis without myelopathy or radiculopathy, lumbosacral region: Secondary | ICD-10-CM

## 2012-08-29 DIAGNOSIS — M5137 Other intervertebral disc degeneration, lumbosacral region: Secondary | ICD-10-CM | POA: Insufficient documentation

## 2012-08-29 DIAGNOSIS — IMO0001 Reserved for inherently not codable concepts without codable children: Secondary | ICD-10-CM | POA: Insufficient documentation

## 2012-08-29 DIAGNOSIS — M47816 Spondylosis without myelopathy or radiculopathy, lumbar region: Secondary | ICD-10-CM

## 2012-08-29 MED ORDER — OXYCODONE HCL 15 MG PO TABS: 15.0000 mg | ORAL_TABLET | Freq: Four times a day (QID) | ORAL | Status: DC | PRN

## 2012-08-29 MED ORDER — MORPHINE SULFATE ER 100 MG PO TBCR: 100.0000 mg | EXTENDED_RELEASE_TABLET | Freq: Two times a day (BID) | ORAL | Status: DC

## 2012-08-29 NOTE — Patient Instructions (Signed)
Continue with your aquatic exercise program 

## 2012-08-29 NOTE — Progress Notes (Signed)
Subjective:    Patient ID: [name], female    DOB: 05-26-1960, 51 y.o.   MRN: [id number]  HPI [name] is back regarding her chronic pain. She generally has been doing fairly well. She tries to get to the pool when she can.  . She is on the pamelor at bedtime as prescribed.  She remains as active as possible but is limited by her pain in general.  No recent changes with her narcotics have been recently made. She is tolerating them well and feels that they are effective.   Pain Inventory Average Pain 7 Pain Right Now 6 My pain is constant, sharp, burning, dull, stabbing, tingling and aching  In the last 24 hours, has pain interfered with the following? General activity 9 Relation with others 9 Enjoyment of life 9 What TIME of day is your pain at its worst? constant Sleep (in general) Poor  Pain is worse with: walking, bending, standing and some activites Pain improves with: rest, heat/ice, therapy/exercise, medication, TENS and injections Relief from Meds: 7  Mobility use a cane how many minutes can you walk? 5-10 ability to climb steps?  yes do you drive?  yes Do you have any goals in this area?  yes  Function disabled: date disabled 2007 I need assistance with the following:  bathing, meal prep, household duties and shopping Do you have any goals in this area?  yes  Neuro/Psych weakness tremor tingling trouble walking spasms dizziness confusion depression anxiety suicidal thoughts-no plan  Prior Studies Any changes since last visit?  no  Physicians involved in your care Any changes since last visit?  no   Family History  Problem Relation Age of Onset  . Depression Mother   . Hypertension Mother   . Hypertension Father   . Heart failure Father   . Diabetes Father   . Heart attack Father   . Colon cancer Neg Hx   . Esophageal cancer Neg Hx   . Stomach cancer Neg Hx   . Rectal cancer Neg Hx    History   Social History  . Marital  Status: Married    Spouse Name: [name]    Number of Children: 1  . Years of Education: some colle   Occupational History  . On disability    Social History Main Topics  . Smoking status: Former Smoker -- 0.50 packs/day for 39 years    Types: Cigarettes    Quit date: 02/03/2012  . Smokeless tobacco: Never Used     Comment: going to try e-sticks to quit  . Alcohol Use: No  . Drug Use: No  . Sexually Active: Not Currently -- Female partner(s)   Other Topics Concern  . None   Social History Narrative   8-10 cups of soda a day.  On disability. No regular exercise.     Past Surgical History  Procedure Laterality Date  . Tonsillectomy  09/1972  . Ulnar nerve entrapment  Feb 1995    left elbow  . Carpal tunnel release  07/1993    both hands  . Tubal ligation  Sept 1999  . Hemorroidectomy  July 2003  . Knee arthroplasty  09/2001, 05/2003    left knee, chondromalasia  . Abdominal hysterectomy  May 2004  . Replacement total knee  Nov 2005    Left   . Thyroid lobectomy  01/2005    malignant area removed from right lobe  . Basel cell carcinoma  06/2005, 07/2005    nasal tip  and reconstruction  . Knee arthroscopy  jan 2005    Right knee, tisse release, chrondromalacia  . Colonoscopy    . Thyroid surgery      thyroid lobe removal   Past Medical History  Diagnosis Date  . Chronic pain syndrome   . Intervertebral lumbar disc disorder with myelopathy, lumbar region   . Facet syndrome, lumbar   . Primary localized osteoarthrosis, lower leg   . Degeneration of lumbar or lumbosacral intervertebral disc   . Unspecified musculoskeletal disorders and symptoms referable to neck     cervical/trapezius  . Migraine without aura, with intractable migraine, so stated, without mention of status migrainosus   . Depression   . Lumbosacral spondylosis without myelopathy   . Allergy   . Anxiety   . Cancer     cancerous nodules on thyroid  . Cancer     basal cell Cancer-nose  . COPD (chronic  obstructive pulmonary disease)   . GERD (gastroesophageal reflux disease)   . Hyperlipidemia   . Thyroid disease     hypothyroid   BP 151/73  Pulse 130  Resp 14  Ht 5\' 9"  (1.753 m)  Wt 260 lb (117.935 kg)  BMI 38.38 kg/m2  SpO2 16%     Review of Systems  Constitutional: Positive for diaphoresis.  Gastrointestinal: Positive for nausea, vomiting and constipation.  Musculoskeletal: Positive for back pain and gait problem.  Neurological: Positive for tremors and weakness.  Psychiatric/Behavioral: Positive for suicidal ideas, confusion and dysphoric mood. The patient is nervous/anxious.   All other systems reviewed and are negative.       Objective:   Physical Exam .   Constitutional: She is oriented to person, place, and time. She appears well-developed.  HENT:  Head: Normocephalic.  Eyes: Pupils are equal, round, and reactive to light.  Neck: Normal range of motion.  Neurological: She is alert and oriented to person, place, and time.  Skin: Skin is warm and dry.  Psychiatric: She has a normal mood and affect.  Symmetric normal motor tone is noted throughout. Normal muscle bulk. Muscle testing reveals 5/5 muscle strength of the upper extremity, and 5/5 of the lower extremity, except left quadriceps 4/5. Full range of motion in upper and lower extremities, except left knee restricted in extension 10 degrees.. ROM of spine is restricted.  DTR in the upper and lower extremity are present and symmetric 1+. No clonus is noted.  Patient arises from chair with difficulty. Wide based gait with cane and forward flexed spine.       Assessment & Plan:  1. Chronic low back pain.  2. Facet arthropathy.  3. Degenerative disk disease.  4. Osteoarthritis of the knees.  5. Migraine headaches with aura  6. Myofascial pain  7. RSD left lower extremity  PLAN:  1. Refilled MS Contin 100 mg 1 p.o. q.12 h., 60 with no refill. . She has been stable with these meds. Encouraged ongoing  activity as tolerated.  2. Oxycodone 15 mg 1 p.o. q.6 h. p.r.n., 120 with no refill .  3. She is having what it appears to be ongoing neuropathic pain which is flaring every few weeks or so in the left leg. The gabapentin helps. Will get her back up to the prescribed dose for one month then titrate to 800mg  qid thereafter. Also can consider increasing her pamelor to 50mg  qhs---she will call me if she would like to pursue that, which she did, we increased the pamelor..  4. Her questions  were encouraged and answered. She will follow up in a  Month with PA.

## 2012-09-24 ENCOUNTER — Encounter: Payer: Self-pay | Admitting: Physical Medicine & Rehabilitation

## 2012-09-24 ENCOUNTER — Encounter: Payer: Medicare Other | Attending: Physical Medicine and Rehabilitation | Admitting: Physical Medicine & Rehabilitation

## 2012-09-24 VITALS — BP 163/80 | HR 123 | Resp 18 | Ht 69.0 in | Wt 267.0 lb

## 2012-09-24 DIAGNOSIS — M47816 Spondylosis without myelopathy or radiculopathy, lumbar region: Secondary | ICD-10-CM

## 2012-09-24 DIAGNOSIS — G90529 Complex regional pain syndrome I of unspecified lower limb: Secondary | ICD-10-CM

## 2012-09-24 DIAGNOSIS — G43119 Migraine with aura, intractable, without status migrainosus: Secondary | ICD-10-CM

## 2012-09-24 DIAGNOSIS — M5137 Other intervertebral disc degeneration, lumbosacral region: Secondary | ICD-10-CM

## 2012-09-24 DIAGNOSIS — M7918 Myalgia, other site: Secondary | ICD-10-CM

## 2012-09-24 DIAGNOSIS — F329 Major depressive disorder, single episode, unspecified: Secondary | ICD-10-CM

## 2012-09-24 DIAGNOSIS — IMO0002 Reserved for concepts with insufficient information to code with codable children: Secondary | ICD-10-CM

## 2012-09-24 DIAGNOSIS — F32A Depression, unspecified: Secondary | ICD-10-CM

## 2012-09-24 DIAGNOSIS — M47817 Spondylosis without myelopathy or radiculopathy, lumbosacral region: Secondary | ICD-10-CM

## 2012-09-24 DIAGNOSIS — M5136 Other intervertebral disc degeneration, lumbar region: Secondary | ICD-10-CM

## 2012-09-24 DIAGNOSIS — IMO0001 Reserved for inherently not codable concepts without codable children: Secondary | ICD-10-CM

## 2012-09-24 DIAGNOSIS — M1712 Unilateral primary osteoarthritis, left knee: Secondary | ICD-10-CM

## 2012-09-24 DIAGNOSIS — M1710 Unilateral primary osteoarthritis, unspecified knee: Secondary | ICD-10-CM

## 2012-09-24 DIAGNOSIS — G90522 Complex regional pain syndrome I of left lower limb: Secondary | ICD-10-CM

## 2012-09-24 MED ORDER — OXYCODONE HCL 15 MG PO TABS: 15.0000 mg | ORAL_TABLET | Freq: Four times a day (QID) | ORAL | Status: DC | PRN

## 2012-09-24 MED ORDER — MORPHINE SULFATE ER 100 MG PO TBCR: 100.0000 mg | EXTENDED_RELEASE_TABLET | Freq: Two times a day (BID) | ORAL | Status: DC

## 2012-09-24 NOTE — Progress Notes (Signed)
Subjective:    Patient ID: [name], female    DOB: [birth year], 52 y.o.   MRN: [id number]  HPI  [name] is back regarding her chronic pain. She is on day 3 of migraine, with pain predominantly behind her eyes, photosensitivity. Her maxalt didn't stop it. Typically she's getting about 3 per month, often more. On average she has 11-12 days per month with headaches. She uses the maxalt with onset of the headaches. She typically uses 10-20mg  for rescue which often works.   She is having problems with constipation. She is taking miralax once daily.      Pain Inventory Average Pain 8 Pain Right Now 7 My pain is constant, sharp, burning, dull, stabbing, tingling and aching  In the last 24 hours, has pain interfered with the following? General activity 9 Relation with others 9 Enjoyment of life 9 What TIME of day is your pain at its worst? constant Sleep (in general) Poor  Pain is worse with: walking, bending, standing and some activites Pain improves with: rest, heat/ice, therapy/exercise, pacing activities, medication, TENS and injections Relief from Meds: 5  Mobility use a cane how many minutes can you walk? 5-10 ability to climb steps?  yes do you drive?  yes Do you have any goals in this area?  yes  Function disabled: date disabled 2007 I need assistance with the following:  dressing, meal prep, household duties and shopping Do you have any goals in this area?  yes  Neuro/Psych bladder control problems weakness numbness tremor tingling trouble walking spasms dizziness confusion depression anxiety suicidal thoughts  Prior Studies Any changes since last visit?  no  Physicians involved in your care Any changes since last visit?  no   Family History  Problem Relation Age of Onset  . Depression Mother   . Hypertension Mother   . Hypertension Father   . Heart failure Father   . Diabetes Father   . Heart attack Father   . Colon cancer Neg Hx    . Esophageal cancer Neg Hx   . Stomach cancer Neg Hx   . Rectal cancer Neg Hx    History   Social History  . Marital Status: Married    Spouse Name: Sullivan Lone    Number of Children: 1  . Years of Education: some colle   Occupational History  . On disability    Social History Main Topics  . Smoking status: Former Smoker -- 0.50 packs/day for 39 years    Types: Cigarettes    Quit date: 02/03/2012  . Smokeless tobacco: Never Used     Comment: going to try e-sticks to quit  . Alcohol Use: No  . Drug Use: No  . Sexual Activity: Not Currently    Partners: Male   Other Topics Concern  . None   Social History Narrative   8-10 cups of soda a day.  On disability. No regular exercise.     Past Surgical History  Procedure Laterality Date  . Tonsillectomy  09/1972  . Ulnar nerve entrapment  Feb 1995    left elbow  . Carpal tunnel release  07/1993    both hands  . Tubal ligation  Sept 1999  . Hemorroidectomy  July 2003  . Knee arthroplasty  09/2001, 05/2003    left knee, chondromalasia  . Abdominal hysterectomy  May 2004  . Replacement total knee  Nov 2005    Left   . Thyroid lobectomy  01/2005    malignant area removed  from right lobe  . Basel cell carcinoma  06/2005, 07/2005    nasal tip and reconstruction  . Knee arthroscopy  jan 2005    Right knee, tisse release, chrondromalacia  . Colonoscopy    . Thyroid surgery      thyroid lobe removal   Past Medical History  Diagnosis Date  . Chronic pain syndrome   . Intervertebral lumbar disc disorder with myelopathy, lumbar region   . Facet syndrome, lumbar   . Primary localized osteoarthrosis, lower leg   . Degeneration of lumbar or lumbosacral intervertebral disc   . Unspecified musculoskeletal disorders and symptoms referable to neck     cervical/trapezius  . Migraine without aura, with intractable migraine, so stated, without mention of status migrainosus   . Depression   . Lumbosacral spondylosis without myelopathy   .  Allergy   . Anxiety   . Cancer     cancerous nodules on thyroid  . Cancer     basal cell Cancer-nose  . COPD (chronic obstructive pulmonary disease)   . GERD (gastroesophageal reflux disease)   . Hyperlipidemia   . Thyroid disease     hypothyroid   BP 163/80  Pulse 123  Resp 18  Ht 5\' 9"  (1.753 m)  Wt 267 lb (121.11 kg)  BMI 39.41 kg/m2  SpO2 93%     Review of Systems  Constitutional: Positive for diaphoresis, appetite change and unexpected weight change.  HENT: Positive for neck pain.   Gastrointestinal: Positive for nausea, vomiting, abdominal pain and constipation.  Musculoskeletal: Positive for myalgias, back pain, arthralgias and gait problem.  Neurological: Positive for dizziness, tremors, weakness, numbness and headaches.  Psychiatric/Behavioral: Positive for suicidal ideas, confusion and dysphoric mood. The patient is nervous/anxious.   All other systems reviewed and are negative.       Objective:   Physical Exam  Constitutional: She is oriented to person, place, and time. She appears well-developed and well-nourished.  overweight  HENT:  Head: Normocephalic.  Eyes: Conjunctivae and EOM are normal. Pupils are equal, round, and reactive to light.  Neck: Normal range of motion.  Cardiovascular: Normal rate and regular rhythm.  Pulmonary/Chest: Effort normal and breath sounds normal.  Abdominal: Soft.  Musculoskeletal: Normal range of motion.  Left knee with warmth and swelling with pain with resisted flex and ext as well as touch.  She is wearing her OA knee Brace. She is wearing a cotton sleeve over the knee. She has pain with extension past 20-30 degrees. She has some discomfort with extension from seated position.  She ambulates using a straight cane to off load the left knee. Continued pain with WB on the left is noted. The left leg/thigh is almost allodynic to touch. Color and temperature are normal.  Trigger point noted at the inferior/medial edge of the  left scapula.  Neurological: She is alert and oriented to person, place, and time. She has normal strength. No cranial nerve deficit or sensory deficit. She is photophobic today. CN norma.  Skin: Skin is warm.  Psychiatric: She has a normal mood and affect. Her behavior is normal. Judgment and thought content normal.  Assessment & Plan:   ASSESSMENT:  1. Chronic low back pain.  2. Facet arthropathy.  3. Degenerative disk disease.  4. Osteoarthritis of the knees.  5. Migraine headaches intractable with aura  6. Myofascial pain  7. RSD left lower extremity    PLAN:  1. Refilled MS Contin 100 mg 1 p.o. q.12 h., 60 with no  refill. I gave her second scripts for next month. She has been stable with these meds.  2. Oxycodone 15 mg 1 p.o. q.6 h. p.r.n., 120 with no refill with a script for next month.  3. Continue neuronting 800mg  qid for neuropathic pain. 4. Will see if we can get her set up with botox injections for intractable migraine headaches given her headache intensity and frequency.  5.Her questions were encouraged and answered. She will follow up in about a month regarding injections if we attain approval. Additionally we reviewed options for rx of her constipation.

## 2012-09-24 NOTE — Patient Instructions (Signed)
CALL ME WITH ANY PROBLEMS OR QUESTIONS (#[phone]).  HAVE A GOOD DAY  

## 2012-10-17 ENCOUNTER — Other Ambulatory Visit: Payer: Self-pay

## 2012-10-17 MED ORDER — NORTRIPTYLINE HCL 50 MG PO CAPS: 50.0000 mg | ORAL_CAPSULE | Freq: Every day | ORAL | Status: DC

## 2012-10-23 ENCOUNTER — Encounter: Payer: Self-pay | Admitting: Physical Medicine & Rehabilitation

## 2012-10-23 ENCOUNTER — Encounter: Payer: Medicare Other | Attending: Physical Medicine and Rehabilitation | Admitting: Physical Medicine & Rehabilitation

## 2012-10-23 VITALS — HR 132 | Resp 16 | Ht 69.0 in | Wt 264.0 lb

## 2012-10-23 DIAGNOSIS — M1710 Unilateral primary osteoarthritis, unspecified knee: Secondary | ICD-10-CM | POA: Insufficient documentation

## 2012-10-23 DIAGNOSIS — M5137 Other intervertebral disc degeneration, lumbosacral region: Secondary | ICD-10-CM | POA: Insufficient documentation

## 2012-10-23 DIAGNOSIS — Z79899 Other long term (current) drug therapy: Secondary | ICD-10-CM | POA: Insufficient documentation

## 2012-10-23 DIAGNOSIS — G43119 Migraine with aura, intractable, without status migrainosus: Secondary | ICD-10-CM

## 2012-10-23 DIAGNOSIS — M47817 Spondylosis without myelopathy or radiculopathy, lumbosacral region: Secondary | ICD-10-CM | POA: Insufficient documentation

## 2012-10-23 DIAGNOSIS — F329 Major depressive disorder, single episode, unspecified: Secondary | ICD-10-CM

## 2012-10-23 DIAGNOSIS — IMO0002 Reserved for concepts with insufficient information to code with codable children: Secondary | ICD-10-CM | POA: Insufficient documentation

## 2012-10-23 DIAGNOSIS — Z5181 Encounter for therapeutic drug level monitoring: Secondary | ICD-10-CM | POA: Insufficient documentation

## 2012-10-23 DIAGNOSIS — G90529 Complex regional pain syndrome I of unspecified lower limb: Secondary | ICD-10-CM

## 2012-10-23 DIAGNOSIS — F3289 Other specified depressive episodes: Secondary | ICD-10-CM | POA: Insufficient documentation

## 2012-10-23 DIAGNOSIS — M51379 Other intervertebral disc degeneration, lumbosacral region without mention of lumbar back pain or lower extremity pain: Secondary | ICD-10-CM | POA: Insufficient documentation

## 2012-10-23 DIAGNOSIS — IMO0001 Reserved for inherently not codable concepts without codable children: Secondary | ICD-10-CM | POA: Insufficient documentation

## 2012-10-23 MED ORDER — OXYCODONE HCL 15 MG PO TABS: 15.0000 mg | ORAL_TABLET | Freq: Four times a day (QID) | ORAL | Status: DC | PRN

## 2012-10-23 MED ORDER — MORPHINE SULFATE ER 100 MG PO TBCR: 100.0000 mg | EXTENDED_RELEASE_TABLET | Freq: Two times a day (BID) | ORAL | Status: DC

## 2012-10-23 NOTE — Progress Notes (Signed)
Botox Injection for chronic migraine headaches  Dilution: 100 Units/ 2ml preservative free NS Indication: refractory headaches incompletely responsive to other more conservative measures.  Informed consent was obtained after describing risks and benefits of the procedure with the patient. This includes bleeding, bruising, infection, excessive weakness, or medication side effects. A REMS form is on file and signed. Needle: 27g 1/2 inch needle   Number of units per muscle:  Right suboccipital 10 units 2 access points Right frontalis 30 units 4 access points Procerus 20 units 2 access points Left frontalis 30 units 4 access points Left suboccipital 10 units 2 access points  All injections were done after  after negative drawback for blood. The patient tolerated the procedure well. Post procedure instructions were given. A followup appointment was made.  I refilled her narcotics for next month.

## 2012-10-23 NOTE — Patient Instructions (Signed)
CALL ME WITH ANY PROBLEMS OR QUESTIONS (#[phone]).  HAVE A GOOD DAY  

## 2012-10-24 ENCOUNTER — Other Ambulatory Visit: Payer: Self-pay | Admitting: Physical Medicine and Rehabilitation

## 2012-11-19 ENCOUNTER — Other Ambulatory Visit: Payer: Self-pay | Admitting: Physical Medicine and Rehabilitation

## 2012-11-20 ENCOUNTER — Other Ambulatory Visit: Payer: Self-pay | Admitting: Family Medicine

## 2012-12-18 ENCOUNTER — Encounter: Payer: Medicare Other | Admitting: Physical Medicine & Rehabilitation

## 2012-12-24 ENCOUNTER — Other Ambulatory Visit: Payer: Self-pay

## 2012-12-24 DIAGNOSIS — F32A Depression, unspecified: Secondary | ICD-10-CM

## 2012-12-24 DIAGNOSIS — F329 Major depressive disorder, single episode, unspecified: Secondary | ICD-10-CM

## 2012-12-24 DIAGNOSIS — M1712 Unilateral primary osteoarthritis, left knee: Secondary | ICD-10-CM

## 2012-12-24 DIAGNOSIS — G43109 Migraine with aura, not intractable, without status migrainosus: Secondary | ICD-10-CM

## 2012-12-24 MED ORDER — GABAPENTIN 800 MG PO TABS: 800.0000 mg | ORAL_TABLET | Freq: Four times a day (QID) | ORAL | Status: DC

## 2012-12-25 ENCOUNTER — Other Ambulatory Visit: Payer: Self-pay

## 2012-12-25 DIAGNOSIS — M1712 Unilateral primary osteoarthritis, left knee: Secondary | ICD-10-CM

## 2012-12-25 DIAGNOSIS — G43109 Migraine with aura, not intractable, without status migrainosus: Secondary | ICD-10-CM

## 2012-12-25 DIAGNOSIS — F329 Major depressive disorder, single episode, unspecified: Secondary | ICD-10-CM

## 2012-12-25 DIAGNOSIS — F32A Depression, unspecified: Secondary | ICD-10-CM

## 2012-12-25 MED ORDER — GABAPENTIN 800 MG PO TABS: 800.0000 mg | ORAL_TABLET | Freq: Four times a day (QID) | ORAL | Status: DC

## 2012-12-26 ENCOUNTER — Encounter
Payer: Medicare Other | Attending: Physical Medicine and Rehabilitation | Admitting: Physical Medicine and Rehabilitation

## 2012-12-26 ENCOUNTER — Encounter: Payer: Self-pay | Admitting: Physical Medicine and Rehabilitation

## 2012-12-26 VITALS — BP 157/69 | HR 125 | Resp 14 | Ht 69.5 in | Wt 263.0 lb

## 2012-12-26 DIAGNOSIS — IMO0001 Reserved for inherently not codable concepts without codable children: Secondary | ICD-10-CM | POA: Insufficient documentation

## 2012-12-26 DIAGNOSIS — M47817 Spondylosis without myelopathy or radiculopathy, lumbosacral region: Secondary | ICD-10-CM

## 2012-12-26 DIAGNOSIS — G90529 Complex regional pain syndrome I of unspecified lower limb: Secondary | ICD-10-CM | POA: Insufficient documentation

## 2012-12-26 DIAGNOSIS — M5137 Other intervertebral disc degeneration, lumbosacral region: Secondary | ICD-10-CM | POA: Insufficient documentation

## 2012-12-26 DIAGNOSIS — M51379 Other intervertebral disc degeneration, lumbosacral region without mention of lumbar back pain or lower extremity pain: Secondary | ICD-10-CM | POA: Insufficient documentation

## 2012-12-26 DIAGNOSIS — G8929 Other chronic pain: Secondary | ICD-10-CM | POA: Insufficient documentation

## 2012-12-26 DIAGNOSIS — G43119 Migraine with aura, intractable, without status migrainosus: Secondary | ICD-10-CM

## 2012-12-26 DIAGNOSIS — M1710 Unilateral primary osteoarthritis, unspecified knee: Secondary | ICD-10-CM | POA: Insufficient documentation

## 2012-12-26 DIAGNOSIS — Z79899 Other long term (current) drug therapy: Secondary | ICD-10-CM | POA: Insufficient documentation

## 2012-12-26 DIAGNOSIS — G43109 Migraine with aura, not intractable, without status migrainosus: Secondary | ICD-10-CM | POA: Insufficient documentation

## 2012-12-26 DIAGNOSIS — G894 Chronic pain syndrome: Secondary | ICD-10-CM

## 2012-12-26 MED ORDER — OXYCODONE HCL 15 MG PO TABS: 15.0000 mg | ORAL_TABLET | Freq: Four times a day (QID) | ORAL | Status: DC | PRN

## 2012-12-26 MED ORDER — MORPHINE SULFATE ER 100 MG PO TBCR: 100.0000 mg | EXTENDED_RELEASE_TABLET | Freq: Two times a day (BID) | ORAL | Status: DC

## 2012-12-26 MED ORDER — RIZATRIPTAN BENZOATE 10 MG PO TABS: 10.0000 mg | ORAL_TABLET | ORAL | Status: DC | PRN

## 2012-12-26 NOTE — Progress Notes (Signed)
Subjective:    Patient ID: [name], female    DOB: [birth year], 52 y.o.   MRN: [id number]  HPI [name] is back regarding her chronic pain. She generally has been doing fairly well. She tries to get to the pool when she can.  . She is on the pamelor at bedtime as prescribed.  She remains as active as possible but is limited by her pain in general.  No recent changes with her narcotics have been recently made. She is tolerating them well and feels that they are effective.  She reports that the last Botox injection has helped to decrease the duration of her migraines.  Pain Inventory Average Pain 8 Pain Right Now 7 My pain is constant, sharp, burning, dull, stabbing, tingling and aching  In the last 24 hours, has pain interfered with the following? General activity 9 Relation with others 9 Enjoyment of life 9 What TIME of day is your pain at its worst? varies Sleep (in general) Poor  Pain is worse with: walking, bending, standing and some activites Pain improves with: rest, heat/ice, therapy/exercise, medication, TENS and injections Relief from Meds: 7  Mobility walk with assistance use a cane how many minutes can you walk? 5 ability to climb steps?  yes do you drive?  yes transfers alone Do you have any goals in this area?  yes  Function not employed: date last employed 02/24/2003 disabled: date disabled 08/2005 I need assistance with the following:  bathing, meal prep, household duties and shopping Do you have any goals in this area?  yes  Neuro/Psych bladder control problems weakness tremor tingling trouble walking spasms dizziness confusion depression anxiety  Prior Studies Any changes since last visit?  no  Physicians involved in your care Any changes since last visit?  no   Family History  Problem Relation Age of Onset  . Depression Mother   . Hypertension Mother   . Hypertension Father   . Heart failure Father   . Diabetes Father    . Heart attack Father   . Colon cancer Neg Hx   . Esophageal cancer Neg Hx   . Stomach cancer Neg Hx   . Rectal cancer Neg Hx    History   Social History  . Marital Status: Married    Spouse Name: Sullivan Lone    Number of Children: 1  . Years of Education: some colle   Occupational History  . On disability    Social History Main Topics  . Smoking status: Former Smoker -- 0.50 packs/day for 39 years    Types: Cigarettes    Quit date: 02/03/2012  . Smokeless tobacco: Never Used     Comment: going to try e-sticks to quit  . Alcohol Use: No  . Drug Use: No  . Sexual Activity: Not Currently    Partners: Male   Other Topics Concern  . None   Social History Narrative   8-10 cups of soda a day.  On disability. No regular exercise.     Past Surgical History  Procedure Laterality Date  . Tonsillectomy  09/1972  . Ulnar nerve entrapment  Feb 1995    left elbow  . Carpal tunnel release  07/1993    both hands  . Tubal ligation  Sept 1999  . Hemorroidectomy  July 2003  . Knee arthroplasty  09/2001, 05/2003    left knee, chondromalasia  . Abdominal hysterectomy  May 2004  . Replacement total knee  Nov 2005    Left   .  Thyroid lobectomy  01/2005    malignant area removed from right lobe  . Basel cell carcinoma  06/2005, 07/2005    nasal tip and reconstruction  . Knee arthroscopy  jan 2005    Right knee, tisse release, chrondromalacia  . Colonoscopy    . Thyroid surgery      thyroid lobe removal   Past Medical History  Diagnosis Date  . Chronic pain syndrome   . Intervertebral lumbar disc disorder with myelopathy, lumbar region   . Facet syndrome, lumbar   . Primary localized osteoarthrosis, lower leg   . Degeneration of lumbar or lumbosacral intervertebral disc   . Unspecified musculoskeletal disorders and symptoms referable to neck     cervical/trapezius  . Migraine without aura, with intractable migraine, so stated, without mention of status migrainosus   . Depression    . Lumbosacral spondylosis without myelopathy   . Allergy   . Anxiety   . Cancer     cancerous nodules on thyroid  . Cancer     basal cell Cancer-nose  . COPD (chronic obstructive pulmonary disease)   . GERD (gastroesophageal reflux disease)   . Hyperlipidemia   . Thyroid disease     hypothyroid   BP 157/69  Pulse 125  Resp 14  Ht 5' 9.5" (1.765 m)  Wt 263 lb (119.296 kg)  BMI 38.29 kg/m2  SpO2 94%      Review of Systems  Constitutional: Positive for diaphoresis and appetite change.  Cardiovascular: Positive for leg swelling.  Gastrointestinal: Positive for nausea and vomiting.  Genitourinary:       Bladder control problems  Musculoskeletal: Positive for gait problem.  Neurological: Positive for dizziness, tremors and weakness.       Tingling, spasms  Psychiatric/Behavioral: Positive for confusion and dysphoric mood. The patient is nervous/anxious.   All other systems reviewed and are negative.       Objective:   Physical Exam Constitutional: She is oriented to person, place, and time. She appears well-developed.  HENT:  Head: Normocephalic.  Eyes: Pupils are equal, round, and reactive to light.  Neck: Normal range of motion.  Neurological: She is alert and oriented to person, place, and time.  Skin: Skin is warm and dry.  Psychiatric: She has a normal mood and affect.  Symmetric normal motor tone is noted throughout. Normal muscle bulk. Muscle testing reveals 5/5 muscle strength of the upper extremity, and 5/5 of the lower extremity, except left quadriceps 4/5. Full range of motion in upper and lower extremities, except left knee restricted in extension 10 degrees.. ROM of spine is restricted.  DTR in the upper and lower extremity are present and symmetric 1+. No clonus is noted.  Patient arises from chair with difficulty. Wide based gait with cane and forward flexed spine.        Assessment & Plan:  1. Chronic low back pain.  2. Facet arthropathy.  3.  Degenerative disk disease.  4. Osteoarthritis of the knees.  5. Migraine headaches with aura, last botox injection helped with decreasing the duration of her migraines  6. Myofascial pain  7. RSD left lower extremity  PLAN:  1. Refilled MS Contin 100 mg 1 p.o. q.12 h., 60 with no refill. . She has been stable with these meds. Encouraged ongoing activity as tolerated.  2. Oxycodone 15 mg 1 p.o. q.6 h. p.r.n., 120 with no refill .  3. She is having what it appears to be ongoing neuropathic pain which is flaring every  few weeks or so in the left leg. The gabapentin 800mg  qid helps. Will get her back up to the prescribed dose for one month then titrate to 800mg  qid thereafter.   Continue withPamelor 50mg  qhs 4. Her questions were encouraged and answered. She will follow up in a  Month with PA.

## 2012-12-26 NOTE — Patient Instructions (Addendum)
Stay as active as tolerated, continue with the aquatic exercises

## 2013-01-09 ENCOUNTER — Other Ambulatory Visit: Payer: Self-pay

## 2013-01-09 DIAGNOSIS — F329 Major depressive disorder, single episode, unspecified: Secondary | ICD-10-CM

## 2013-01-09 DIAGNOSIS — M47816 Spondylosis without myelopathy or radiculopathy, lumbar region: Secondary | ICD-10-CM

## 2013-01-09 DIAGNOSIS — M1712 Unilateral primary osteoarthritis, left knee: Secondary | ICD-10-CM

## 2013-01-09 DIAGNOSIS — M5136 Other intervertebral disc degeneration, lumbar region: Secondary | ICD-10-CM

## 2013-01-09 DIAGNOSIS — F32A Depression, unspecified: Secondary | ICD-10-CM

## 2013-01-09 DIAGNOSIS — G43109 Migraine with aura, not intractable, without status migrainosus: Secondary | ICD-10-CM

## 2013-01-09 MED ORDER — CYCLOBENZAPRINE HCL 5 MG PO TABS: 5.0000 mg | ORAL_TABLET | Freq: Three times a day (TID) | ORAL | Status: DC | PRN

## 2013-01-21 ENCOUNTER — Other Ambulatory Visit: Payer: Self-pay | Admitting: *Deleted

## 2013-01-21 DIAGNOSIS — G43119 Migraine with aura, intractable, without status migrainosus: Secondary | ICD-10-CM

## 2013-01-21 MED ORDER — MORPHINE SULFATE ER 100 MG PO TBCR: 100.0000 mg | EXTENDED_RELEASE_TABLET | Freq: Two times a day (BID) | ORAL | Status: DC

## 2013-01-21 MED ORDER — OXYCODONE HCL 15 MG PO TABS: 15.0000 mg | ORAL_TABLET | Freq: Four times a day (QID) | ORAL | Status: DC | PRN

## 2013-01-21 NOTE — Telephone Encounter (Signed)
RX printed early for controlled medication for the visit with RN on 01/27/13 (to be signed by MD) 

## 2013-01-22 ENCOUNTER — Other Ambulatory Visit: Payer: Self-pay | Admitting: *Deleted

## 2013-01-22 DIAGNOSIS — G43119 Migraine with aura, intractable, without status migrainosus: Secondary | ICD-10-CM

## 2013-01-22 MED ORDER — OXYCODONE HCL 15 MG PO TABS: 15.0000 mg | ORAL_TABLET | Freq: Four times a day (QID) | ORAL | Status: DC | PRN

## 2013-01-22 MED ORDER — MORPHINE SULFATE ER 100 MG PO TBCR: 100.0000 mg | EXTENDED_RELEASE_TABLET | Freq: Two times a day (BID) | ORAL | Status: DC

## 2013-01-22 NOTE — Telephone Encounter (Addendum)
Erroneous encounter- this message and rx's were printed and documented twice. Appt is sched for 01/27/13:: RX printed early for controlled medication for the visit with RN on 01/28/13 (to be signed by MD)

## 2013-01-27 ENCOUNTER — Encounter: Payer: Medicare Other | Attending: Physical Medicine & Rehabilitation | Admitting: *Deleted

## 2013-01-27 ENCOUNTER — Encounter: Payer: Self-pay | Admitting: *Deleted

## 2013-01-27 VITALS — BP 138/44 | HR 111 | Resp 14

## 2013-01-27 DIAGNOSIS — IMO0001 Reserved for inherently not codable concepts without codable children: Secondary | ICD-10-CM | POA: Insufficient documentation

## 2013-01-27 DIAGNOSIS — M7918 Myalgia, other site: Secondary | ICD-10-CM

## 2013-01-27 DIAGNOSIS — G8929 Other chronic pain: Secondary | ICD-10-CM | POA: Insufficient documentation

## 2013-01-27 DIAGNOSIS — G43109 Migraine with aura, not intractable, without status migrainosus: Secondary | ICD-10-CM | POA: Insufficient documentation

## 2013-01-27 DIAGNOSIS — M51379 Other intervertebral disc degeneration, lumbosacral region without mention of lumbar back pain or lower extremity pain: Secondary | ICD-10-CM | POA: Insufficient documentation

## 2013-01-27 DIAGNOSIS — G90522 Complex regional pain syndrome I of left lower limb: Secondary | ICD-10-CM

## 2013-01-27 DIAGNOSIS — G90529 Complex regional pain syndrome I of unspecified lower limb: Secondary | ICD-10-CM | POA: Insufficient documentation

## 2013-01-27 DIAGNOSIS — M5136 Other intervertebral disc degeneration, lumbar region: Secondary | ICD-10-CM

## 2013-01-27 DIAGNOSIS — M1712 Unilateral primary osteoarthritis, left knee: Secondary | ICD-10-CM

## 2013-01-27 DIAGNOSIS — M47816 Spondylosis without myelopathy or radiculopathy, lumbar region: Secondary | ICD-10-CM

## 2013-01-27 DIAGNOSIS — M1710 Unilateral primary osteoarthritis, unspecified knee: Secondary | ICD-10-CM | POA: Insufficient documentation

## 2013-01-27 DIAGNOSIS — M5137 Other intervertebral disc degeneration, lumbosacral region: Secondary | ICD-10-CM | POA: Insufficient documentation

## 2013-01-27 NOTE — Progress Notes (Signed)
Here for pill count and medication refills. No changes in medication list or pain levels.  Morphine Sulfate ER 100 mg Fill date 12/30/12 # 60    Today NV# 11 and Oxycodone 15 mg # 120 Fill date 12/30/12 Today NV# 2.  Pill counts appropriate.  Return to clinic in one month for RN visit pill count and med refill.

## 2013-01-27 NOTE — Patient Instructions (Signed)
Follow up one month with RN for pill count and med refill 

## 2013-02-17 ENCOUNTER — Other Ambulatory Visit: Payer: Self-pay | Admitting: Family Medicine

## 2013-02-19 ENCOUNTER — Other Ambulatory Visit: Payer: Self-pay | Admitting: *Deleted

## 2013-02-19 DIAGNOSIS — G43119 Migraine with aura, intractable, without status migrainosus: Secondary | ICD-10-CM

## 2013-02-19 MED ORDER — OXYCODONE HCL 15 MG PO TABS: 15.0000 mg | ORAL_TABLET | Freq: Four times a day (QID) | ORAL | Status: DC | PRN

## 2013-02-19 MED ORDER — MORPHINE SULFATE ER 100 MG PO TBCR: 100.0000 mg | EXTENDED_RELEASE_TABLET | Freq: Two times a day (BID) | ORAL | Status: DC

## 2013-02-19 NOTE — Telephone Encounter (Signed)
RX printed early for controlled medication for the visit with RN on 02/24/13 (to be signed by MD) 

## 2013-02-21 ENCOUNTER — Other Ambulatory Visit: Payer: Self-pay

## 2013-02-21 MED ORDER — NORTRIPTYLINE HCL 50 MG PO CAPS: 50.0000 mg | ORAL_CAPSULE | Freq: Every day | ORAL | Status: DC

## 2013-02-24 ENCOUNTER — Encounter: Payer: Medicare Other | Attending: Physical Medicine & Rehabilitation | Admitting: *Deleted

## 2013-02-24 ENCOUNTER — Other Ambulatory Visit: Payer: Self-pay

## 2013-02-24 ENCOUNTER — Telehealth: Payer: Self-pay | Admitting: *Deleted

## 2013-02-24 VITALS — BP 144/89 | HR 114 | Resp 16 | Wt 261.0 lb

## 2013-02-24 DIAGNOSIS — M51379 Other intervertebral disc degeneration, lumbosacral region without mention of lumbar back pain or lower extremity pain: Secondary | ICD-10-CM | POA: Insufficient documentation

## 2013-02-24 DIAGNOSIS — IMO0002 Reserved for concepts with insufficient information to code with codable children: Secondary | ICD-10-CM | POA: Insufficient documentation

## 2013-02-24 DIAGNOSIS — Z760 Encounter for issue of repeat prescription: Secondary | ICD-10-CM | POA: Insufficient documentation

## 2013-02-24 DIAGNOSIS — IMO0001 Reserved for inherently not codable concepts without codable children: Secondary | ICD-10-CM | POA: Insufficient documentation

## 2013-02-24 DIAGNOSIS — M5136 Other intervertebral disc degeneration, lumbar region: Secondary | ICD-10-CM

## 2013-02-24 DIAGNOSIS — M47817 Spondylosis without myelopathy or radiculopathy, lumbosacral region: Secondary | ICD-10-CM | POA: Insufficient documentation

## 2013-02-24 DIAGNOSIS — G90522 Complex regional pain syndrome I of left lower limb: Secondary | ICD-10-CM

## 2013-02-24 DIAGNOSIS — M1712 Unilateral primary osteoarthritis, left knee: Secondary | ICD-10-CM

## 2013-02-24 DIAGNOSIS — G90529 Complex regional pain syndrome I of unspecified lower limb: Secondary | ICD-10-CM | POA: Insufficient documentation

## 2013-02-24 DIAGNOSIS — M51369 Other intervertebral disc degeneration, lumbar region without mention of lumbar back pain or lower extremity pain: Secondary | ICD-10-CM

## 2013-02-24 DIAGNOSIS — M5137 Other intervertebral disc degeneration, lumbosacral region: Secondary | ICD-10-CM | POA: Insufficient documentation

## 2013-02-24 DIAGNOSIS — M7918 Myalgia, other site: Secondary | ICD-10-CM

## 2013-02-24 DIAGNOSIS — M47816 Spondylosis without myelopathy or radiculopathy, lumbar region: Secondary | ICD-10-CM

## 2013-02-24 DIAGNOSIS — M1710 Unilateral primary osteoarthritis, unspecified knee: Secondary | ICD-10-CM | POA: Insufficient documentation

## 2013-02-24 MED ORDER — TIZANIDINE HCL 2 MG PO TABS: 2.0000 mg | ORAL_TABLET | Freq: Three times a day (TID) | ORAL | Status: DC | PRN

## 2013-02-24 NOTE — Patient Instructions (Signed)
Follow up in one month with Dr Naaman Plummer or PA

## 2013-02-24 NOTE — Progress Notes (Signed)
Here for pill count and medication refills. Morphine Sulfate ER 100 mg #60 Fill date 01/27/13    Today NV#  16 and Oxycodone 15 mg # 120  Fill date 01/27/13 Today NV# 10  Pill counts are appropriate. VSS    Pain level: 7  Opioid risk score today is 5, because of + history with her mother and prescription meds and herself with chronic low level depression.  No changes in medications or pain level.  Her insurance company wants her to change her muscle relaxer from flexeril to another medication.  Tizanidine is on the list.  I will send this information to Dr Naaman Plummer to switch her to a preferred med. Follow in one month with Dr Naaman Plummer or Algis Liming PA.  She had stopped smoking for about 3 months but she has slipped up and is going to go back to PCP to get another rx for Chantix.

## 2013-02-24 NOTE — Telephone Encounter (Signed)
Tizanidine 2mg  q6 prn #90. May take tid, 4RF

## 2013-02-24 NOTE — Telephone Encounter (Signed)
[name] was in for pill count and med refill today.  Her insurance company is wanting her to switch from flexeril to a preferred med which is tizanidine.  She is currently on flexeril 5 mg tid #90.  Please advise about switching her to the tizanidine.

## 2013-02-24 NOTE — Telephone Encounter (Signed)
Tizanidine sent to pharmacy. Patient notified.

## 2013-02-26 ENCOUNTER — Ambulatory Visit (INDEPENDENT_AMBULATORY_CARE_PROVIDER_SITE_OTHER): Payer: Medicare Other | Admitting: Family Medicine

## 2013-02-26 ENCOUNTER — Encounter: Payer: Self-pay | Admitting: Family Medicine

## 2013-02-26 VITALS — BP 125/65 | HR 108 | Temp 100.6°F | Ht 69.6 in | Wt 261.0 lb

## 2013-02-26 DIAGNOSIS — E039 Hypothyroidism, unspecified: Secondary | ICD-10-CM

## 2013-02-26 DIAGNOSIS — Z9189 Other specified personal risk factors, not elsewhere classified: Secondary | ICD-10-CM

## 2013-02-26 DIAGNOSIS — Z720 Tobacco use: Secondary | ICD-10-CM

## 2013-02-26 DIAGNOSIS — Z9289 Personal history of other medical treatment: Secondary | ICD-10-CM

## 2013-02-26 DIAGNOSIS — L821 Other seborrheic keratosis: Secondary | ICD-10-CM

## 2013-02-26 DIAGNOSIS — F17200 Nicotine dependence, unspecified, uncomplicated: Secondary | ICD-10-CM

## 2013-02-26 MED ORDER — VARENICLINE TARTRATE 0.5 MG X 11 & 1 MG X 42 PO MISC: ORAL | Status: DC

## 2013-02-26 NOTE — Progress Notes (Signed)
   Subjective:    Patient ID: [name], female    DOB: 11-20-1960, 53 y.o.   MRN: [id number]  HPI  Would like to quit smoking. He tried to quit 3 times last year.  She had quit for 3 months. No hx of COPD.  Says does usually gets bronchitis in the spring. Has had Chantix in the past and did well with it.    She has several moles on her face and her back that she would like me to look at today. She would like to have them removed. Not bothersome but she does not like them. On her face she feels like they are disfiguring. Her father had similar moles.   Hypothyroidism-she has gained a few pounds and she is she has been very inactive because of her chronic back pain. Has noticed a year since we last checked her thyroid level. No recent skin or hair changes. Review of Systems     Objective:   Physical Exam  Constitutional: She is oriented to person, place, and time. She appears well-developed and well-nourished.  HENT:  Head: Normocephalic and atraumatic.  Cardiovascular: Normal rate, regular rhythm and normal heart sounds.   Pulmonary/Chest: Effort normal and breath sounds normal.  Neurological: She is alert and oriented to person, place, and time.  Skin: Skin is warm and dry.  She has some scattered brown seborrheic keratoses on her temples and scalp as well as on her back. These are very amenable to cryotherapy.  Psychiatric: She has a normal mood and affect. Her behavior is normal.          Assessment & Plan:  Smoker - will restart Chantix. Discussed potential side effects of the medication. She plans on stopping smoking and the next day or 2. Her husband is quitting as well. Encouraged her to complete the full 3 month treatment plan.   Seborrheic keratosis-explained to her that these are typically hereditary, they are not from sun damage. recommended she check with her insurance to see if it's covered. Typically we can treat these with cryotherapy.  She's interested  in the shingles vaccine. Encouraged her to check with her insurance to see if it's covered or not, then she is under age 23.  Hypothyroidism-recheck TSH today. She's taking her medication consistently.  Obesity-she's very unhappy with her weight gain. She's not able to exercise regularly because of her back. Encouraged her to go online and look at you to 4 beers a chair exercises. These can be done sitting in a chair. She says she does have some hand weights at home and these can be added for extra toning and strengthening. She does get some physical activity and work on at least stretching. This could potentially help with her pain in her back in addition to helping her burn more calories for weight loss.  Refer for mammogram. Last mammogram was the most 2 years ago.

## 2013-02-26 NOTE — Patient Instructions (Addendum)
Most of the lesions on her face and back are seborrheic keratoses. We typically treat this with freezing them with liquid nitrogen called cryotherapy. He can check with your insurance to see if it's a covered benefit. Also recommend that you call your insurance company to see if the shingles vaccine is covered since she were under age 53.    Smoking Cessation, Tips for Success If you are ready to quit smoking, congratulations! You have chosen to help yourself be healthier. Cigarettes bring nicotine, tar, carbon monoxide, and other irritants into your body. Your lungs, heart, and blood vessels will be able to work better without these poisons. There are many different ways to quit smoking. Nicotine gum, nicotine patches, a nicotine inhaler, or nicotine nasal spray can help with physical craving. Hypnosis, support groups, and medicines help break the habit of smoking. WHAT THINGS CAN I DO TO MAKE QUITTING EASIER?  Here are some tips to help you quit for good:  Pick a date when you will quit smoking completely. Tell all of your friends and family about your plan to quit on that date.  Do not try to slowly cut down on the number of cigarettes you are smoking. Pick a quit date and quit smoking completely starting on that day.  Throw away all cigarettes.   Clean and remove all ashtrays from your home, work, and car.   On a card, write down your reasons for quitting. Carry the card with you and read it when you get the urge to smoke.   Cleanse your body of nicotine. Drink enough water and fluids to keep your urine clear or pale yellow. Do this after quitting to flush the nicotine from your body.   Learn to predict your moods. Do not let a bad situation be your excuse to have a cigarette. Some situations in your life might tempt you into wanting a cigarette.   Never have "just one" cigarette. It leads to wanting another and another. Remind yourself of your decision to quit.   Change habits  associated with smoking. If you smoked while driving or when feeling stressed, try other activities to replace smoking. Stand up when drinking your coffee. Brush your teeth after eating. Sit in a different chair when you read the paper. Avoid alcohol while trying to quit, and try to drink fewer caffeinated beverages. Alcohol and caffeine may urge you to smoke.   Avoid foods and drinks that can trigger a desire to smoke, such as sugary or spicy foods and alcohol.   Ask people who smoke not to smoke around you.   Have something planned to do right after eating or having a cup of coffee. For example, plan to take a walk or exercise.   Try a relaxation exercise to calm you down and decrease your stress. Remember, you may be tense and nervous for the first 2 weeks after you quit, but this will pass.   Find new activities to keep your hands busy. Play with a pen, coin, or rubber band. Doodle or draw things on paper.   Brush your teeth right after eating. This will help cut down on the craving for the taste of tobacco after meals. You can also try mouthwash.   Use oral substitutes in place of cigarettes. Try using lemon drops, carrots, cinnamon sticks, or chewing gum. Keep them handy so they are available when you have the urge to smoke.   When you have the urge to smoke, try deep breathing.  Designate your home as a nonsmoking area.   If you are a heavy smoker, ask your health care provider about a prescription for nicotine chewing gum. It can ease your withdrawal from nicotine.   Reward yourself. Set aside the cigarette money you save and buy yourself something nice.   Look for support from others. Join a support group or smoking cessation program. Ask someone at home or at work to help you with your plan to quit smoking.   Always ask yourself, "Do I need this cigarette or is this just a reflex?" Tell yourself, "Today, I choose not to smoke," or "I do not want to smoke." You are  reminding yourself of your decision to quit.  Do not replace cigarette smoking with electronic cigarettes (commonly called e-cigarettes). The safety of e-cigarettes is unknown, and some may contain harmful chemicals.  If you relapse, do not give up! Plan ahead and think about what you will do the next time you get the urge to smoke.  HOW WILL I FEEL WHEN I QUIT SMOKING? You may have symptoms of withdrawal because your body is used to nicotine (the addictive substance in cigarettes). You may crave cigarettes, be irritable, feel very hungry, cough often, get headaches, or have difficulty concentrating. The withdrawal symptoms are only temporary. They are strongest when you first quit but will go away within 10 14 days. When withdrawal symptoms occur, stay in control. Think about your reasons for quitting. Remind yourself that these are signs that your body is healing and getting used to being without cigarettes. Remember that withdrawal symptoms are easier to treat than the major diseases that smoking can cause.  Even after the withdrawal is over, expect periodic urges to smoke. However, these cravings are generally short lived and will go away whether you smoke or not. Do not smoke!  WHAT RESOURCES ARE AVAILABLE TO HELP ME QUIT SMOKING? Your health care provider can direct you to community resources or hospitals for support, which may include:  Group support.  Education.  Hypnosis.  Therapy. Document Released: 10/22/2003 Document Revised: 11/13/2012 Document Reviewed: 07/11/2012 [facility] Patient Information 2014 [place], [place].

## 2013-03-26 ENCOUNTER — Encounter: Payer: Medicare Other | Attending: Physical Medicine and Rehabilitation | Admitting: Physical Medicine & Rehabilitation

## 2013-03-26 ENCOUNTER — Encounter: Payer: Self-pay | Admitting: Physical Medicine & Rehabilitation

## 2013-03-26 ENCOUNTER — Encounter (INDEPENDENT_AMBULATORY_CARE_PROVIDER_SITE_OTHER): Payer: Self-pay

## 2013-03-26 VITALS — BP 133/77 | HR 122 | Resp 14 | Ht 69.0 in | Wt 254.0 lb

## 2013-03-26 DIAGNOSIS — M1712 Unilateral primary osteoarthritis, left knee: Secondary | ICD-10-CM

## 2013-03-26 DIAGNOSIS — IMO0001 Reserved for inherently not codable concepts without codable children: Secondary | ICD-10-CM

## 2013-03-26 DIAGNOSIS — M1710 Unilateral primary osteoarthritis, unspecified knee: Secondary | ICD-10-CM | POA: Insufficient documentation

## 2013-03-26 DIAGNOSIS — M7918 Myalgia, other site: Secondary | ICD-10-CM

## 2013-03-26 DIAGNOSIS — M47816 Spondylosis without myelopathy or radiculopathy, lumbar region: Secondary | ICD-10-CM

## 2013-03-26 DIAGNOSIS — M47817 Spondylosis without myelopathy or radiculopathy, lumbosacral region: Secondary | ICD-10-CM

## 2013-03-26 DIAGNOSIS — M1711 Unilateral primary osteoarthritis, right knee: Secondary | ICD-10-CM

## 2013-03-26 DIAGNOSIS — IMO0002 Reserved for concepts with insufficient information to code with codable children: Secondary | ICD-10-CM | POA: Insufficient documentation

## 2013-03-26 DIAGNOSIS — G9050 Complex regional pain syndrome I, unspecified: Secondary | ICD-10-CM

## 2013-03-26 DIAGNOSIS — G43119 Migraine with aura, intractable, without status migrainosus: Secondary | ICD-10-CM

## 2013-03-26 MED ORDER — NORTRIPTYLINE HCL 50 MG PO CAPS: 100.0000 mg | ORAL_CAPSULE | Freq: Every day | ORAL | Status: DC

## 2013-03-26 MED ORDER — OXYCODONE HCL 15 MG PO TABS: 15.0000 mg | ORAL_TABLET | Freq: Four times a day (QID) | ORAL | Status: DC | PRN

## 2013-03-26 MED ORDER — MORPHINE SULFATE ER 100 MG PO TBCR: 100.0000 mg | EXTENDED_RELEASE_TABLET | Freq: Two times a day (BID) | ORAL | Status: DC

## 2013-03-26 MED ORDER — NONFORMULARY OR COMPOUNDED ITEM: Status: DC

## 2013-03-26 NOTE — Patient Instructions (Signed)
PLEASE CALL ME WITH ANY PROBLEMS OR QUESTIONS (#[phone]).      

## 2013-03-26 NOTE — Progress Notes (Signed)
Subjective:    Patient ID: [name], female    DOB: May 22, 1960, 53 y.o.   MRN: [id number]  HPI  Cathlyn is back regarding her chronic pain. She presents today with increased pain and headache. She also hasn't slept well and her knees have been hypersensitive to touch. She's had difficulty putting her pants on over the last 2 days as her knees have been that sensitive. She is not sure if it is the cold weather or what which has exacerbated her pain.   She remains limited with activity due to her generalized knee pain. She does try to do some basic walking and exercise.  She remains on her other medications as i have previously presribed.   The botox injections in September provided about a month of pain relief. Her headaches tend to be worse when her knee pain is bad and when she doesn't sleep as well.      Pain Inventory Average Pain 8 Pain Right Now 8 My pain is constant, sharp, burning, dull, stabbing, tingling and aching  In the last 24 hours, has pain interfered with the following? General activity 10 Relation with others 10 Enjoyment of life 10 What TIME of day is your pain at its worst? all day Sleep (in general) Poor  Pain is worse with: walking, bending, standing and some activites Pain improves with: rest, heat/ice, therapy/exercise, medication, TENS and injections Relief from Meds: 6  Mobility walk with assistance use a cane how many minutes can you walk? 5 ability to climb steps?  yes do you drive?  yes Do you have any goals in this area?  yes  Function not employed: date last employed 02/24/03 disabled: date disabled 08/2005 I need assistance with the following:  bathing, meal prep, household duties and shopping Do you have any goals in this area?  yes  Neuro/Psych bladder control problems weakness tremor tingling trouble walking spasms dizziness confusion depression anxiety suicidal thoughts  Prior Studies Any changes since last  visit?  no  Physicians involved in your care Any changes since last visit?  no   Family History  Problem Relation Age of Onset  . Depression Mother   . Hypertension Mother   . Hypertension Father   . Heart failure Father   . Diabetes Father   . Heart attack Father   . Colon cancer Neg Hx   . Esophageal cancer Neg Hx   . Stomach cancer Neg Hx   . Rectal cancer Neg Hx    History   Social History  . Marital Status: Married    Spouse Name: [name]    Number of Children: 1  . Years of Education: some colle   Occupational History  . On disability    Social History Main Topics  . Smoking status: Former Smoker -- 0.50 packs/day for 39 years    Types: Cigarettes    Quit date: 02/03/2012  . Smokeless tobacco: Never Used     Comment: going to try e-sticks to quit  . Alcohol Use: No  . Drug Use: No  . Sexual Activity: Not Currently    Partners: Male   Other Topics Concern  . None   Social History Narrative   8-10 cups of soda a day.  On disability. No regular exercise.     Past Surgical History  Procedure Laterality Date  . Tonsillectomy  09/1972  . Ulnar nerve entrapment  Feb 1995    left elbow  . Carpal tunnel release  07/1993  both hands  . Tubal ligation  Sept 1999  . Hemorroidectomy  July 2003  . Knee arthroplasty  09/2001, 05/2003    left knee, chondromalasia  . Abdominal hysterectomy  May 2004  . Replacement total knee  Nov 2005    Left   . Thyroid lobectomy  01/2005    malignant area removed from right lobe  . Basel cell carcinoma  06/2005, 07/2005    nasal tip and reconstruction  . Knee arthroscopy  jan 2005    Right knee, tisse release, chrondromalacia  . Colonoscopy    . Thyroid surgery      thyroid lobe removal   Past Medical History  Diagnosis Date  . Chronic pain syndrome   . Intervertebral lumbar disc disorder with myelopathy, lumbar region   . Facet syndrome, lumbar   . Primary localized osteoarthrosis, lower leg   . Degeneration of lumbar  or lumbosacral intervertebral disc   . Unspecified musculoskeletal disorders and symptoms referable to neck     cervical/trapezius  . Migraine without aura, with intractable migraine, so stated, without mention of status migrainosus   . Depression   . Lumbosacral spondylosis without myelopathy   . Allergy   . Anxiety   . Cancer     cancerous nodules on thyroid  . Cancer     basal cell Cancer-nose  . COPD (chronic obstructive pulmonary disease)   . GERD (gastroesophageal reflux disease)   . Hyperlipidemia   . Thyroid disease     hypothyroid   BP 133/77  Pulse 122  Resp 14  Ht 5\' 9"  (1.753 m)  Wt 254 lb (115.214 kg)  BMI 37.49 kg/m2  SpO2 96%  Opioid Risk Score:   Fall Risk Score: Moderate Fall Risk (6-13 points) (pt educated on fall risk, declined brochure)   Review of Systems  Constitutional: Positive for diaphoresis, appetite change and unexpected weight change.  Cardiovascular: Positive for leg swelling.  Gastrointestinal: Positive for nausea, vomiting and constipation.  Genitourinary:       Bladder control problems  Musculoskeletal: Positive for back pain and neck pain.  Neurological: Positive for dizziness, tremors and weakness.       Tingling, spasms  Psychiatric/Behavioral: Positive for suicidal ideas, confusion and dysphoric mood. The patient is nervous/anxious.        Pt has suicidal thoughts, but she does not have a plan.  All other systems reviewed and are negative.       Objective:   Physical Exam   Constitutional: She is oriented to person, place, and time. She appears well-developed.  HENT:  Head: Normocephalic.  Eyes: Pupils are equal, round, and reactive to light.  Neck: Normal range of motion.  Neurological: She is alert and oriented to person, place, and time.  Skin: Skin is warm and dry.  Psychiatric: She has a normal mood and affect.  Symmetric normal motor tone is noted throughout. Normal muscle bulk. Muscle testing reveals 5/5 muscle  strength of the upper extremity, and 5/5 of the lower extremity, except left quadriceps 4/5. Full range of motion in upper and lower extremities, except left knee restricted in extension largely due to pain. Both knees are slightly erythematous without substantial swelling or warmth. Both knees are allodynic.Marland Kitchen ROM of spine is restricted.  DTR in the upper and lower extremity are present and symmetric 1+. No clonus is noted.    Wide based gait with cane and forward flexed spine.   Assessment & Plan:   1. Chronic low back pain.  2. Facet arthropathy.  3. Degenerative disk disease.  4. Osteoarthritis of the knees.  5. Migraine headaches with aura, last botox injection helped with decreasing the duration of her migraines  6. Myofascial pain  7. RSD left lower extremity and right (knees most affected)    PLAN:  1. Refilled MS Contin 100 mg 1 p.o. q.12 h., 60 with no refill. . She has been stable with these meds. Encouraged ongoing activity as tolerated.  2. Oxycodone 15 mg 1 p.o. q.6 h. p.r.n., 120 with no refill .  3. Continue with gabapentin 800mg  qid which does provide relief 4. Increase pamelor to 100mg  qhs 5. Try custom compounded cream, ketamine 4%, ketoprofen 20%, baclofen 5%, elavil 5% for neuropathic knee pain. 6. Given her increased pain and history of flares such as today's, I would like to pursue aTriple Phase bone scan--to assess definitively for RSD---consider lumbar sympathetic plexus blocks pending study. 7. Her questions were encouraged and answered. She will follow up in a  Month .

## 2013-04-15 ENCOUNTER — Other Ambulatory Visit: Payer: Self-pay | Admitting: *Deleted

## 2013-04-15 DIAGNOSIS — G43119 Migraine with aura, intractable, without status migrainosus: Secondary | ICD-10-CM

## 2013-04-15 MED ORDER — OXYCODONE HCL 15 MG PO TABS: 15.0000 mg | ORAL_TABLET | Freq: Four times a day (QID) | ORAL | Status: DC | PRN

## 2013-04-15 MED ORDER — MORPHINE SULFATE ER 100 MG PO TBCR: 100.0000 mg | EXTENDED_RELEASE_TABLET | Freq: Two times a day (BID) | ORAL | Status: DC

## 2013-04-15 NOTE — Telephone Encounter (Signed)
RX printed for MD to sign for RN visit 04/16/13

## 2013-04-16 ENCOUNTER — Encounter: Payer: Medicare Other | Admitting: *Deleted

## 2013-04-18 ENCOUNTER — Ambulatory Visit (HOSPITAL_COMMUNITY)
Admission: RE | Admit: 2013-04-18 | Discharge: 2013-04-18 | Disposition: A | Discharge status: Home / Self Care | Payer: Medicare Other | Source: Ambulatory Visit | Attending: Physical Medicine & Rehabilitation | Admitting: Physical Medicine & Rehabilitation

## 2013-04-18 ENCOUNTER — Other Ambulatory Visit: Payer: Self-pay | Admitting: Physical Medicine & Rehabilitation

## 2013-04-18 ENCOUNTER — Encounter (HOSPITAL_COMMUNITY)
Admission: RE | Admit: 2013-04-18 | Discharge: 2013-04-18 | Disposition: A | Discharge status: Home / Self Care | Payer: Medicare Other | Source: Ambulatory Visit | Attending: Physical Medicine & Rehabilitation | Admitting: Physical Medicine & Rehabilitation

## 2013-04-18 DIAGNOSIS — M25562 Pain in left knee: Principal | ICD-10-CM

## 2013-04-18 DIAGNOSIS — G9050 Complex regional pain syndrome I, unspecified: Secondary | ICD-10-CM

## 2013-04-18 DIAGNOSIS — M25561 Pain in right knee: Secondary | ICD-10-CM

## 2013-04-18 DIAGNOSIS — M79609 Pain in unspecified limb: Secondary | ICD-10-CM | POA: Insufficient documentation

## 2013-04-18 DIAGNOSIS — M25469 Effusion, unspecified knee: Secondary | ICD-10-CM | POA: Insufficient documentation

## 2013-04-18 DIAGNOSIS — M25569 Pain in unspecified knee: Secondary | ICD-10-CM | POA: Insufficient documentation

## 2013-04-18 DIAGNOSIS — M7989 Other specified soft tissue disorders: Secondary | ICD-10-CM | POA: Insufficient documentation

## 2013-04-18 MED ORDER — TECHNETIUM TC 99M MEDRONATE IV KIT
25.0000 | PACK | Freq: Once | INTRAVENOUS | Status: AC | PRN
  Administered 2013-04-18: 25 via INTRAVENOUS

## 2013-04-21 ENCOUNTER — Other Ambulatory Visit: Payer: Self-pay

## 2013-04-21 ENCOUNTER — Telehealth: Payer: Self-pay | Admitting: Physical Medicine & Rehabilitation

## 2013-04-21 MED ORDER — PROMETHAZINE HCL 12.5 MG PO TABS: ORAL_TABLET | ORAL | Status: DC

## 2013-04-21 NOTE — Telephone Encounter (Signed)
Please inform [name] that there are no signs of RSD/Complex regional pain on her bone scan.

## 2013-04-21 NOTE — Telephone Encounter (Signed)
Patient informed of bone scan results.

## 2013-04-22 ENCOUNTER — Other Ambulatory Visit: Payer: Self-pay

## 2013-04-22 MED ORDER — CITALOPRAM HYDROBROMIDE 40 MG PO TABS: ORAL_TABLET | ORAL | Status: DC

## 2013-05-15 ENCOUNTER — Other Ambulatory Visit: Payer: Self-pay | Admitting: Family Medicine

## 2013-05-19 ENCOUNTER — Other Ambulatory Visit: Payer: Self-pay

## 2013-05-19 DIAGNOSIS — F329 Major depressive disorder, single episode, unspecified: Secondary | ICD-10-CM

## 2013-05-19 DIAGNOSIS — F32A Depression, unspecified: Secondary | ICD-10-CM

## 2013-05-19 DIAGNOSIS — G43109 Migraine with aura, not intractable, without status migrainosus: Secondary | ICD-10-CM

## 2013-05-19 DIAGNOSIS — M1712 Unilateral primary osteoarthritis, left knee: Secondary | ICD-10-CM

## 2013-05-19 MED ORDER — GABAPENTIN 800 MG PO TABS
800.0000 mg | ORAL_TABLET | Freq: Four times a day (QID) | ORAL | Status: DC
Start: 2013-05-19 — End: 2013-10-08

## 2013-05-20 ENCOUNTER — Other Ambulatory Visit: Payer: Self-pay | Admitting: *Deleted

## 2013-05-20 DIAGNOSIS — G43119 Migraine with aura, intractable, without status migrainosus: Secondary | ICD-10-CM

## 2013-05-20 MED ORDER — MORPHINE SULFATE ER 100 MG PO TBCR: 100.0000 mg | EXTENDED_RELEASE_TABLET | Freq: Two times a day (BID) | ORAL | Status: DC

## 2013-05-20 MED ORDER — OXYCODONE HCL 15 MG PO TABS: 15.0000 mg | ORAL_TABLET | Freq: Four times a day (QID) | ORAL | Status: DC | PRN

## 2013-05-20 NOTE — Telephone Encounter (Signed)
[facility] rx printed for MD to sign for RN refill visit 05/23/13

## 2013-05-23 ENCOUNTER — Encounter: Payer: Medicare Other | Attending: Physical Medicine and Rehabilitation | Admitting: *Deleted

## 2013-05-23 ENCOUNTER — Encounter: Payer: Self-pay | Admitting: *Deleted

## 2013-05-23 VITALS — BP 123/57 | HR 110 | Resp 14

## 2013-05-23 DIAGNOSIS — M545 Low back pain, unspecified: Secondary | ICD-10-CM | POA: Insufficient documentation

## 2013-05-23 DIAGNOSIS — M25561 Pain in right knee: Secondary | ICD-10-CM

## 2013-05-23 DIAGNOSIS — M25562 Pain in left knee: Secondary | ICD-10-CM

## 2013-05-23 DIAGNOSIS — Z79899 Other long term (current) drug therapy: Secondary | ICD-10-CM

## 2013-05-23 DIAGNOSIS — Z5181 Encounter for therapeutic drug level monitoring: Secondary | ICD-10-CM

## 2013-05-23 DIAGNOSIS — G8929 Other chronic pain: Secondary | ICD-10-CM

## 2013-05-23 DIAGNOSIS — M25569 Pain in unspecified knee: Secondary | ICD-10-CM | POA: Insufficient documentation

## 2013-05-23 NOTE — Progress Notes (Signed)
Here for pill count and medication refills. MS Contin 100 mg #60 fill date 04/28/13 Today # 18  Oxycodone 15 mg  # 120 Fill date 04/28/13   Today #22. Pill counts are appropriate.   I have given her refills on these medications.  She has been having migraines lately and the last one lasted 3 days.  She had to refill her phenergan which she has not had to do in quite some time. She will return to see the NP next month for refills.

## 2013-05-23 NOTE — Patient Instructions (Signed)
Follow up one month with NP

## 2013-06-02 ENCOUNTER — Other Ambulatory Visit: Payer: Self-pay | Admitting: Physical Medicine & Rehabilitation

## 2013-06-02 NOTE — Progress Notes (Signed)
Urine drug screen from 05/23/2013 was consistent. 

## 2013-06-16 ENCOUNTER — Other Ambulatory Visit: Payer: Self-pay

## 2013-06-16 MED ORDER — PROMETHAZINE HCL 12.5 MG PO TABS: ORAL_TABLET | ORAL | Status: DC

## 2013-06-19 ENCOUNTER — Encounter: Payer: Medicare Other | Attending: Physical Medicine and Rehabilitation | Admitting: Registered Nurse

## 2013-06-19 ENCOUNTER — Encounter: Payer: Self-pay | Admitting: Registered Nurse

## 2013-06-19 VITALS — BP 128/88 | HR 114 | Resp 14 | Ht 69.0 in | Wt 254.0 lb

## 2013-06-19 DIAGNOSIS — M7918 Myalgia, other site: Secondary | ICD-10-CM

## 2013-06-19 DIAGNOSIS — M47817 Spondylosis without myelopathy or radiculopathy, lumbosacral region: Secondary | ICD-10-CM

## 2013-06-19 DIAGNOSIS — M1712 Unilateral primary osteoarthritis, left knee: Secondary | ICD-10-CM

## 2013-06-19 DIAGNOSIS — M1710 Unilateral primary osteoarthritis, unspecified knee: Secondary | ICD-10-CM

## 2013-06-19 DIAGNOSIS — IMO0002 Reserved for concepts with insufficient information to code with codable children: Secondary | ICD-10-CM

## 2013-06-19 DIAGNOSIS — Z79899 Other long term (current) drug therapy: Secondary | ICD-10-CM

## 2013-06-19 DIAGNOSIS — IMO0001 Reserved for inherently not codable concepts without codable children: Secondary | ICD-10-CM

## 2013-06-19 DIAGNOSIS — M47816 Spondylosis without myelopathy or radiculopathy, lumbar region: Secondary | ICD-10-CM

## 2013-06-19 DIAGNOSIS — G43119 Migraine with aura, intractable, without status migrainosus: Secondary | ICD-10-CM | POA: Insufficient documentation

## 2013-06-19 DIAGNOSIS — G9050 Complex regional pain syndrome I, unspecified: Secondary | ICD-10-CM

## 2013-06-19 DIAGNOSIS — G43109 Migraine with aura, not intractable, without status migrainosus: Secondary | ICD-10-CM

## 2013-06-19 DIAGNOSIS — M1711 Unilateral primary osteoarthritis, right knee: Secondary | ICD-10-CM

## 2013-06-19 DIAGNOSIS — Z5181 Encounter for therapeutic drug level monitoring: Secondary | ICD-10-CM

## 2013-06-19 MED ORDER — OXYCODONE HCL 15 MG PO TABS: 15.0000 mg | ORAL_TABLET | Freq: Four times a day (QID) | ORAL | Status: DC | PRN

## 2013-06-19 MED ORDER — MORPHINE SULFATE ER 100 MG PO TBCR: 100.0000 mg | EXTENDED_RELEASE_TABLET | Freq: Two times a day (BID) | ORAL | Status: DC

## 2013-06-19 NOTE — Progress Notes (Signed)
Subjective:    Patient ID: [name], female    DOB: 27-Apr-1960, 53 y.o.   MRN: [id number]  HPI: [name]. [name] is a 53 year old female who returns for follow up for chronic pain and medication refill. She says her pain is located in in her middle back, lower back and left knee. She rates her pain 7. Her current exercise regime is chair exercises, and aquatic exercises. She usually goes to the [facility] twice a week for aquatic exercises. The month of April she was only able to go twice a month due to migraines. She admits she has been having a lot of migraines lately without any aura. She is having a migraine right now that has lasted for 4 days, she says "it's easing up". Also states the Maxalt helps.  Pain Inventory Average Pain 8 Pain Right Now 7 My pain is constant, sharp, burning, dull, stabbing, tingling and aching  In the last 24 hours, has pain interfered with the following? General activity 9 Relation with others 9 Enjoyment of life 9 What TIME of day is your pain at its worst? constant all day Sleep (in general) Poor  Pain is worse with: walking, bending, standing and some activites Pain improves with: rest, heat/ice, therapy/exercise, medication, TENS and injections Relief from Meds: 6  Mobility walk with assistance use a cane how many minutes can you walk? 5-7 ability to climb steps?  yes do you drive?  yes Do you have any goals in this area?  yes  Function not employed: date last employed 02/24/2003 disabled: date disabled 08/2005 I need assistance with the following:  bathing, meal prep, household duties and shopping Do you have any goals in this area?  yes  Neuro/Psych weakness tremor tingling trouble walking spasms dizziness confusion depression anxiety  Prior Studies Any changes since last visit?  no  Physicians involved in your care Any changes since last visit?  no   Family History  Problem Relation Age of Onset  .  Depression Mother   . Hypertension Mother   . Hypertension Father   . Heart failure Father   . Diabetes Father   . Heart attack Father   . Colon cancer Neg Hx   . Esophageal cancer Neg Hx   . Stomach cancer Neg Hx   . Rectal cancer Neg Hx    History   Social History  . Marital Status: Married    Spouse Name: [name]    Number of Children: 1  . Years of Education: some colle   Occupational History  . On disability    Social History Main Topics  . Smoking status: Former Smoker -- 0.50 packs/day for 39 years    Types: Cigarettes    Quit date: 02/03/2012  . Smokeless tobacco: Never Used     Comment: going to try e-sticks to quit  . Alcohol Use: No  . Drug Use: No  . Sexual Activity: Not Currently    Partners: Male   Other Topics Concern  . None   Social History Narrative   8-10 cups of soda a day.  On disability. No regular exercise.     Past Surgical History  Procedure Laterality Date  . Tonsillectomy  09/1972  . Ulnar nerve entrapment  Feb 1995    left elbow  . Carpal tunnel release  07/1993    both hands  . Tubal ligation  Sept 1999  . Hemorroidectomy  July 2003  . Knee arthroplasty  09/2001,  05/2003    left knee, chondromalasia  . Abdominal hysterectomy  May 2004  . Replacement total knee  Nov 2005    Left   . Thyroid lobectomy  01/2005    malignant area removed from right lobe  . Basel cell carcinoma  06/2005, 07/2005    nasal tip and reconstruction  . Knee arthroscopy  jan 2005    Right knee, tisse release, chrondromalacia  . Colonoscopy    . Thyroid surgery      thyroid lobe removal   Past Medical History  Diagnosis Date  . Chronic pain syndrome   . Intervertebral lumbar disc disorder with myelopathy, lumbar region   . Facet syndrome, lumbar   . Primary localized osteoarthrosis, lower leg   . Degeneration of lumbar or lumbosacral intervertebral disc   . Unspecified musculoskeletal disorders and symptoms referable to neck     cervical/trapezius  .  Migraine without aura, with intractable migraine, so stated, without mention of status migrainosus   . Depression   . Lumbosacral spondylosis without myelopathy   . Allergy   . Anxiety   . Cancer     cancerous nodules on thyroid  . Cancer     basal cell Cancer-nose  . COPD (chronic obstructive pulmonary disease)   . GERD (gastroesophageal reflux disease)   . Hyperlipidemia   . Thyroid disease     hypothyroid   BP 128/88  Pulse 114  Resp 14  Ht 5\' 9"  (1.753 m)  Wt 254 lb (115.214 kg)  BMI 37.49 kg/m2  SpO2 97%  Opioid Risk Score:   Fall Risk Score: High Fall Risk (>13 points) (pt educated on fall risk, pt given brochure previously)   Review of Systems  Constitutional: Positive for diaphoresis, appetite change and unexpected weight change.  Cardiovascular: Positive for leg swelling.  Gastrointestinal: Positive for nausea, vomiting and constipation.  Musculoskeletal: Positive for back pain and neck pain.  Neurological: Positive for dizziness, tremors and weakness.       Tingling, spasms  Psychiatric/Behavioral: Positive for confusion. The patient is nervous/anxious.        Depression  All other systems reviewed and are negative.      Objective:   Physical Exam  Nursing note and vitals reviewed. Constitutional: She is oriented to person, place, and time. She appears well-developed and well-nourished.  HENT:  Head: Normocephalic and atraumatic.  Neck: Normal range of motion. Neck supple.  Cardiovascular: Normal rate, regular rhythm and normal heart sounds.   Pulmonary/Chest: Effort normal and breath sounds normal.  Musculoskeletal:  Mormal Muscle Bulk: Muscle Testing Reveals: Upper Extremities: Full ROM and Muscle Strength 5/5 Spine Flexion 90 degrees. Lumbar Paraspinal tenderness: L-4-L-5. Lower Extremities: Bilateral Flexion produces pain into patella, no tenderness or swelling with palpation Walks with a three prong cane. Arises from chair with  ease Circumduction Gait  Neurological: She is alert and oriented to person, place, and time.  Skin: Skin is warm and dry.  Psychiatric: She has a normal mood and affect.          Assessment & Plan:  1. Chronic Low Back Pain: Refilled: Oxycodone 15 mg one tablet every 6 hours as needed #120 and MS Contin 100 mg one tablet BID 2. Degenerative Disk Disease:Continue exercise, and heat therapy. Continue Current Medication Regime. 3. Osteoarthritis of Bilateral Knees: On Compound Ointment. Continue exercise and heat therapy. 4. Migraine Headaches: On Maxalt/ Relief Noted.  20 minutes of face to face patient care time was spent during this visit.  All questions were encouraged and answered.  F/U in 1 month.

## 2013-07-15 ENCOUNTER — Other Ambulatory Visit: Payer: Self-pay

## 2013-07-15 MED ORDER — TIZANIDINE HCL 2 MG PO TABS: 2.0000 mg | ORAL_TABLET | Freq: Three times a day (TID) | ORAL | Status: DC | PRN

## 2013-07-24 ENCOUNTER — Encounter: Payer: Self-pay | Admitting: Registered Nurse

## 2013-07-24 ENCOUNTER — Encounter: Payer: Medicare Other | Attending: Physical Medicine and Rehabilitation | Admitting: Registered Nurse

## 2013-07-24 VITALS — BP 145/95 | HR 110 | Resp 16 | Ht 69.0 in | Wt 254.0 lb

## 2013-07-24 DIAGNOSIS — IMO0001 Reserved for inherently not codable concepts without codable children: Secondary | ICD-10-CM

## 2013-07-24 DIAGNOSIS — IMO0002 Reserved for concepts with insufficient information to code with codable children: Secondary | ICD-10-CM

## 2013-07-24 DIAGNOSIS — M47816 Spondylosis without myelopathy or radiculopathy, lumbar region: Secondary | ICD-10-CM

## 2013-07-24 DIAGNOSIS — M1711 Unilateral primary osteoarthritis, right knee: Secondary | ICD-10-CM

## 2013-07-24 DIAGNOSIS — M1710 Unilateral primary osteoarthritis, unspecified knee: Secondary | ICD-10-CM

## 2013-07-24 DIAGNOSIS — G43119 Migraine with aura, intractable, without status migrainosus: Secondary | ICD-10-CM

## 2013-07-24 DIAGNOSIS — Z79899 Other long term (current) drug therapy: Secondary | ICD-10-CM

## 2013-07-24 DIAGNOSIS — Z5181 Encounter for therapeutic drug level monitoring: Secondary | ICD-10-CM

## 2013-07-24 DIAGNOSIS — G9050 Complex regional pain syndrome I, unspecified: Secondary | ICD-10-CM

## 2013-07-24 DIAGNOSIS — M1712 Unilateral primary osteoarthritis, left knee: Secondary | ICD-10-CM

## 2013-07-24 DIAGNOSIS — M47817 Spondylosis without myelopathy or radiculopathy, lumbosacral region: Secondary | ICD-10-CM

## 2013-07-24 DIAGNOSIS — M7918 Myalgia, other site: Secondary | ICD-10-CM

## 2013-07-24 MED ORDER — OXYCODONE HCL 15 MG PO TABS: 15.0000 mg | ORAL_TABLET | Freq: Four times a day (QID) | ORAL | Status: DC | PRN

## 2013-07-24 MED ORDER — MORPHINE SULFATE ER 100 MG PO TBCR: 100.0000 mg | EXTENDED_RELEASE_TABLET | Freq: Two times a day (BID) | ORAL | Status: DC

## 2013-07-24 NOTE — Progress Notes (Signed)
Subjective:    Patient ID: [name], female    DOB: 1960/08/13, 53 y.o.   MRN: [id number]  HPI: [name] is a 53 year old female who returns for follow up for chronic pain and medication refill. She says her pain is located in her middle back, left hip, and bilateral knees.  Also has been having migraines that have been lasting 3-4 days. She rates her pain 7. Her current exercise regime is performing chair exercises and using the bands. She goes to [facility] for pool therapy. Spoke to her about smoking cessation classes as well. She verbalized understanding.  Pain Inventory Average Pain 8 Pain Right Now 7 My pain is constant, sharp, burning, dull, stabbing, tingling and aching  In the last 24 hours, has pain interfered with the following? General activity 9 Relation with others 9 Enjoyment of life 9 What TIME of day is your pain at its worst? varies Sleep (in general) Poor  Pain is worse with: walking, bending, standing and some activites Pain improves with: rest, heat/ice, medication, TENS and injections Relief from Meds: 5  Mobility use a cane how many minutes can you walk? 5 ability to climb steps?  yes do you drive?  yes Do you have any goals in this area?  yes  Function disabled: date disabled 07 I need assistance with the following:  bathing, meal prep, household duties and shopping Do you have any goals in this area?  yes  Neuro/Psych bladder control problems weakness numbness tremor tingling trouble walking spasms dizziness confusion depression  Prior Studies Any changes since last visit?  no  Physicians involved in your care Any changes since last visit?  no   Family History  Problem Relation Age of Onset  . Depression Mother   . Hypertension Mother   . Hypertension Father   . Heart failure Father   . Diabetes Father   . Heart attack Father   . Colon cancer Neg Hx   . Esophageal cancer Neg Hx   . Stomach cancer Neg Hx     . Rectal cancer Neg Hx    History   Social History  . Marital Status: Married    Spouse Name: [name]    Number of Children: 1  . Years of Education: some colle   Occupational History  . On disability    Social History Main Topics  . Smoking status: Former Smoker -- 0.50 packs/day for 39 years    Types: Cigarettes    Quit date: 02/03/2012  . Smokeless tobacco: Never Used     Comment: going to try e-sticks to quit  . Alcohol Use: No  . Drug Use: No  . Sexual Activity: Not Currently    Partners: Male   Other Topics Concern  . None   Social History Narrative   8-10 cups of soda a day.  On disability. No regular exercise.     Past Surgical History  Procedure Laterality Date  . Tonsillectomy  09/1972  . Ulnar nerve entrapment  Feb 1995    left elbow  . Carpal tunnel release  07/1993    both hands  . Tubal ligation  Sept 1999  . Hemorroidectomy  July 2003  . Knee arthroplasty  09/2001, 05/2003    left knee, chondromalasia  . Abdominal hysterectomy  May 2004  . Replacement total knee  Nov 2005    Left   . Thyroid lobectomy  01/2005    malignant area removed from right  lobe  . Basel cell carcinoma  06/2005, 07/2005    nasal tip and reconstruction  . Knee arthroscopy  jan 2005    Right knee, tisse release, chrondromalacia  . Colonoscopy    . Thyroid surgery      thyroid lobe removal   Past Medical History  Diagnosis Date  . Chronic pain syndrome   . Intervertebral lumbar disc disorder with myelopathy, lumbar region   . Facet syndrome, lumbar   . Primary localized osteoarthrosis, lower leg   . Degeneration of lumbar or lumbosacral intervertebral disc   . Unspecified musculoskeletal disorders and symptoms referable to neck     cervical/trapezius  . Migraine without aura, with intractable migraine, so stated, without mention of status migrainosus   . Depression   . Lumbosacral spondylosis without myelopathy   . Allergy   . Anxiety   . Cancer     cancerous nodules  on thyroid  . Cancer     basal cell Cancer-nose  . COPD (chronic obstructive pulmonary disease)   . GERD (gastroesophageal reflux disease)   . Hyperlipidemia   . Thyroid disease     hypothyroid   BP 145/95  Pulse 110  Resp 16  Ht 5\' 9"  (1.753 m)  Wt 254 lb (115.214 kg)  BMI 37.49 kg/m2  SpO2 94%  Opioid Risk Score:   Fall Risk Score: Moderate Fall Risk (6-13 points) (patient educated handout declined)   Review of Systems  Constitutional: Positive for diaphoresis.  Gastrointestinal: Positive for nausea and vomiting.  Musculoskeletal: Positive for back pain and gait problem.  Neurological: Positive for dizziness, tremors, weakness and numbness.  Psychiatric/Behavioral: Positive for confusion and dysphoric mood. The patient is nervous/anxious.   All other systems reviewed and are negative.      Objective:   Physical Exam  Nursing note and vitals reviewed. Constitutional: She is oriented to person, place, and time. She appears well-developed and well-nourished.  HENT:  Head: Normocephalic and atraumatic.  Neck: Normal range of motion. Neck supple.  Cardiovascular: Normal rate, regular rhythm and normal heart sounds.   Pulmonary/Chest: Effort normal and breath sounds normal.  Musculoskeletal:  Normal Muscle Bulk and Muscle Testing Reveals: Upper Extremities: Full ROM and Musclle Strength 5/5 Spine of Scapula Tenderness on the Left Side. Lower Extremities: Bilateral Flexion Produces Pain into Patella. Hypersensitivity to Anterior portion of thighs Uses a three prong cane for support Arises from chair with ease Antalgic gait.  Neurological: She is alert and oriented to person, place, and time.  Skin: Skin is warm and dry.  Psychiatric: She has a normal mood and affect.          Assessment & Plan:  1.Chronic Low Back Pain: Refilled: Oxycodone 15 mg one tablet every 6 hours as needed #120 and MS Contin 100 mg one tablet BID  2. Degenerative Disk Disease:Continue  exercise, and heat therapy. Continue Current Medication Regime.  3. Osteoarthritis of Bilateral Knees: On Compound Ointment. Continue exercise and heat therapy.  4. Migraine Headaches: On Maxalt/ Relief Noted.  20 minutes of face to face patient care time was spent during this visit. All questions were encouraged and answered.  F/U in 1 month.

## 2013-08-05 ENCOUNTER — Other Ambulatory Visit: Payer: Self-pay

## 2013-08-05 DIAGNOSIS — M47816 Spondylosis without myelopathy or radiculopathy, lumbar region: Secondary | ICD-10-CM

## 2013-08-05 DIAGNOSIS — M7918 Myalgia, other site: Secondary | ICD-10-CM

## 2013-08-05 DIAGNOSIS — G9050 Complex regional pain syndrome I, unspecified: Secondary | ICD-10-CM

## 2013-08-05 MED ORDER — NORTRIPTYLINE HCL 50 MG PO CAPS: 100.0000 mg | ORAL_CAPSULE | Freq: Every day | ORAL | Status: DC

## 2013-08-09 ENCOUNTER — Other Ambulatory Visit: Payer: Self-pay | Admitting: Family Medicine

## 2013-08-21 ENCOUNTER — Encounter: Payer: Self-pay | Admitting: Registered Nurse

## 2013-08-21 ENCOUNTER — Encounter: Payer: Medicare Other | Attending: Physical Medicine and Rehabilitation | Admitting: Registered Nurse

## 2013-08-21 VITALS — BP 96/62 | HR 120 | Resp 16 | Ht 69.0 in | Wt 256.0 lb

## 2013-08-21 DIAGNOSIS — Z5181 Encounter for therapeutic drug level monitoring: Secondary | ICD-10-CM

## 2013-08-21 DIAGNOSIS — M1710 Unilateral primary osteoarthritis, unspecified knee: Secondary | ICD-10-CM

## 2013-08-21 DIAGNOSIS — G9050 Complex regional pain syndrome I, unspecified: Secondary | ICD-10-CM

## 2013-08-21 DIAGNOSIS — M1711 Unilateral primary osteoarthritis, right knee: Secondary | ICD-10-CM

## 2013-08-21 DIAGNOSIS — M7918 Myalgia, other site: Secondary | ICD-10-CM

## 2013-08-21 DIAGNOSIS — G43119 Migraine with aura, intractable, without status migrainosus: Secondary | ICD-10-CM | POA: Insufficient documentation

## 2013-08-21 DIAGNOSIS — M47816 Spondylosis without myelopathy or radiculopathy, lumbar region: Secondary | ICD-10-CM

## 2013-08-21 DIAGNOSIS — Z79899 Other long term (current) drug therapy: Secondary | ICD-10-CM

## 2013-08-21 DIAGNOSIS — M47817 Spondylosis without myelopathy or radiculopathy, lumbosacral region: Secondary | ICD-10-CM

## 2013-08-21 DIAGNOSIS — M1712 Unilateral primary osteoarthritis, left knee: Secondary | ICD-10-CM

## 2013-08-21 DIAGNOSIS — IMO0001 Reserved for inherently not codable concepts without codable children: Secondary | ICD-10-CM

## 2013-08-21 DIAGNOSIS — IMO0002 Reserved for concepts with insufficient information to code with codable children: Secondary | ICD-10-CM

## 2013-08-21 MED ORDER — OXYCODONE HCL 15 MG PO TABS: 15.0000 mg | ORAL_TABLET | Freq: Four times a day (QID) | ORAL | Status: DC | PRN

## 2013-08-21 MED ORDER — MORPHINE SULFATE ER 100 MG PO TBCR: 100.0000 mg | EXTENDED_RELEASE_TABLET | Freq: Two times a day (BID) | ORAL | Status: DC

## 2013-08-21 NOTE — Progress Notes (Signed)
Subjective:    Patient ID: KARLEA MCKIBBIN, female    DOB: [birth year], 53 y.o.   MRN: [id number]  HPI: Mrs. LAKIESHA RALPHS is a 53 year old female who returns for follow up for chronic pain and medication refill. She says her pain is located in her left shoulder blade and bilateral knees. He rates her pain 7. Her current exercise regime is performing chair exercises daily and she has went to the pool twice in a month. She desires to go to pool more often and will be working towards this goal.  Pain Inventory Average Pain 8 Pain Right Now 7 My pain is constant, sharp, burning, dull, stabbing, tingling and aching  In the last 24 hours, has pain interfered with the following? General activity 9 Relation with others 9 Enjoyment of life 9 What TIME of day is your pain at its worst? constant Sleep (in general) Poor  Pain is worse with: walking, bending, standing and some activites Pain improves with: rest, heat/ice, medication, TENS and injections Relief from Meds: 6  Mobility use a cane how many minutes can you walk? 5 ability to climb steps?  yes do you drive?  yes Do you have any goals in this area?  yes  Function not employed: date last employed 02/2003 disabled: date disabled 08/2005 I need assistance with the following:  bathing, meal prep, household duties and shopping Do you have any goals in this area?  yes  Neuro/Psych weakness numbness tremor tingling trouble walking spasms confusion depression anxiety  Prior Studies Any changes since last visit?  no  Physicians involved in your care Any changes since last visit?  no   Family History  Problem Relation Age of Onset  . Depression Mother   . Hypertension Mother   . Hypertension Father   . Heart failure Father   . Diabetes Father   . Heart attack Father   . Colon cancer Neg Hx   . Esophageal cancer Neg Hx   . Stomach cancer Neg Hx   . Rectal cancer Neg Hx    History   Social History  .  Marital Status: Married    Spouse Name: [name]    Number of Children: 1  . Years of Education: some colle   Occupational History  . On disability    Social History Main Topics  . Smoking status: Former Smoker -- 0.50 packs/day for 39 years    Types: Cigarettes    Quit date: 02/03/2012  . Smokeless tobacco: Never Used     Comment: going to try e-sticks to quit  . Alcohol Use: No  . Drug Use: No  . Sexual Activity: Not Currently    Partners: Male   Other Topics Concern  . None   Social History Narrative   8-10 cups of soda a day.  On disability. No regular exercise.     Past Surgical History  Procedure Laterality Date  . Tonsillectomy  09/1972  . Ulnar nerve entrapment  Feb 1995    left elbow  . Carpal tunnel release  07/1993    both hands  . Tubal ligation  Sept 1999  . Hemorroidectomy  July 2003  . Knee arthroplasty  09/2001, 05/2003    left knee, chondromalasia  . Abdominal hysterectomy  May 2004  . Replacement total knee  Nov 2005    Left   . Thyroid lobectomy  01/2005    malignant area removed from right lobe  . Basel cell carcinoma  06/2005, 07/2005    nasal tip and reconstruction  . Knee arthroscopy  jan 2005    Right knee, tisse release, chrondromalacia  . Colonoscopy    . Thyroid surgery      thyroid lobe removal   Past Medical History  Diagnosis Date  . Chronic pain syndrome   . Intervertebral lumbar disc disorder with myelopathy, lumbar region   . Facet syndrome, lumbar   . Primary localized osteoarthrosis, lower leg   . Degeneration of lumbar or lumbosacral intervertebral disc   . Unspecified musculoskeletal disorders and symptoms referable to neck     cervical/trapezius  . Migraine without aura, with intractable migraine, so stated, without mention of status migrainosus   . Depression   . Lumbosacral spondylosis without myelopathy   . Allergy   . Anxiety   . Cancer     cancerous nodules on thyroid  . Cancer     basal cell Cancer-nose  . COPD  (chronic obstructive pulmonary disease)   . GERD (gastroesophageal reflux disease)   . Hyperlipidemia   . Thyroid disease     hypothyroid   BP 96/62  Pulse 120  Resp 16  Ht 5\' 9"  (1.753 m)  Wt 256 lb (116.121 kg)  BMI 37.79 kg/m2  SpO2 94%  Opioid Risk Score:   Fall Risk Score: High Fall Risk (>13 points) (patient educated handout declined)   Review of Systems  Constitutional: Positive for diaphoresis.  Gastrointestinal: Positive for nausea, vomiting and constipation.  Musculoskeletal: Positive for arthralgias, back pain, gait problem and myalgias.  Neurological: Positive for tremors, weakness and numbness.  Psychiatric/Behavioral: Positive for confusion and dysphoric mood. The patient is nervous/anxious.   All other systems reviewed and are negative.      Objective:   Physical Exam  Nursing note and vitals reviewed. Constitutional: She is oriented to person, place, and time. She appears well-developed and well-nourished.  HENT:  Head: Normocephalic and atraumatic.  Neck: Normal range of motion. Neck supple.  Cardiovascular: Normal rate and regular rhythm.   Pulmonary/Chest: Effort normal and breath sounds normal.  Musculoskeletal: She exhibits edema.  Normal Muscle Bulk and Muscle Testing Reveals: Upper Extremities: Full ROM and Muscle Strength 5/5 Left Spine of Scapula Tenderness Lower Extremities: Full ROM and Muscle Strength 5/5 Bilateral Flexion Produces Pain into Patella Lower Extremities Edema 2+ Wide Based Gait/ Antalgic gait  Neurological: She is alert and oriented to person, place, and time.  Skin: Skin is warm and dry.          Assessment & Plan:  1.Chronic Low Back Pain: Refilled: Oxycodone 15 mg one tablet every 6 hours as needed #120 and MS Contin 100 mg one tablet BID  2. Degenerative Disk Disease:Continue exercise, and heat therapy. Continue Current Medication Regime.  3. Osteoarthritis of Bilateral Knees: On Compound Ointment. Continue exercise  and heat therapy.  4. Migraine Headaches: On Maxalt.  20 minutes of face to face patient care time was spent during this visit. All questions were encouraged and answered.   F/U in 1 month.

## 2013-09-12 ENCOUNTER — Telehealth: Payer: Self-pay

## 2013-09-12 NOTE — Telephone Encounter (Signed)
Left a message for patient to return call. She will need a Medicare Wellness visit.

## 2013-09-13 ENCOUNTER — Other Ambulatory Visit: Payer: Self-pay | Admitting: Family Medicine

## 2013-09-30 ENCOUNTER — Encounter: Payer: Medicare Other | Attending: Physical Medicine and Rehabilitation | Admitting: Physical Medicine & Rehabilitation

## 2013-09-30 ENCOUNTER — Encounter: Payer: Self-pay | Admitting: Physical Medicine & Rehabilitation

## 2013-09-30 VITALS — BP 157/87 | HR 125 | Resp 14 | Ht 69.0 in | Wt 245.0 lb

## 2013-09-30 DIAGNOSIS — Z720 Tobacco use: Secondary | ICD-10-CM

## 2013-09-30 DIAGNOSIS — M1711 Unilateral primary osteoarthritis, right knee: Secondary | ICD-10-CM

## 2013-09-30 DIAGNOSIS — G90522 Complex regional pain syndrome I of left lower limb: Secondary | ICD-10-CM

## 2013-09-30 DIAGNOSIS — M1710 Unilateral primary osteoarthritis, unspecified knee: Secondary | ICD-10-CM

## 2013-09-30 DIAGNOSIS — G43119 Migraine with aura, intractable, without status migrainosus: Secondary | ICD-10-CM | POA: Diagnosis not present

## 2013-09-30 DIAGNOSIS — G90529 Complex regional pain syndrome I of unspecified lower limb: Secondary | ICD-10-CM

## 2013-09-30 DIAGNOSIS — M1712 Unilateral primary osteoarthritis, left knee: Secondary | ICD-10-CM

## 2013-09-30 DIAGNOSIS — F32A Depression, unspecified: Secondary | ICD-10-CM

## 2013-09-30 DIAGNOSIS — F329 Major depressive disorder, single episode, unspecified: Secondary | ICD-10-CM

## 2013-09-30 DIAGNOSIS — M51379 Other intervertebral disc degeneration, lumbosacral region without mention of lumbar back pain or lower extremity pain: Secondary | ICD-10-CM

## 2013-09-30 DIAGNOSIS — F3289 Other specified depressive episodes: Secondary | ICD-10-CM

## 2013-09-30 DIAGNOSIS — M5137 Other intervertebral disc degeneration, lumbosacral region: Secondary | ICD-10-CM

## 2013-09-30 DIAGNOSIS — M7918 Myalgia, other site: Secondary | ICD-10-CM

## 2013-09-30 DIAGNOSIS — F17200 Nicotine dependence, unspecified, uncomplicated: Secondary | ICD-10-CM

## 2013-09-30 DIAGNOSIS — IMO0001 Reserved for inherently not codable concepts without codable children: Secondary | ICD-10-CM

## 2013-09-30 DIAGNOSIS — M5136 Other intervertebral disc degeneration, lumbar region: Secondary | ICD-10-CM

## 2013-09-30 MED ORDER — MORPHINE SULFATE ER 100 MG PO TBCR: 100.0000 mg | EXTENDED_RELEASE_TABLET | Freq: Two times a day (BID) | ORAL | Status: DC

## 2013-09-30 MED ORDER — OXYCODONE HCL 15 MG PO TABS: 15.0000 mg | ORAL_TABLET | Freq: Four times a day (QID) | ORAL | Status: DC | PRN

## 2013-09-30 NOTE — Progress Notes (Signed)
Subjective:    Patient ID: [name], female    DOB: [birth year], 53 y.o.   MRN: [id number]  HPI  Kellyanne is back regarding her chronic pain. Her left shoulder has been acting up over the last couple months. It is irritating her with basic activities using the arm and sometimes even with rest. The injection we had performed there last year has been helpful up until recently.   Her triple phase bone scan was negative for CRPS.  Her knee pain is persistent.   She is reporting 3 severe migraine headaches per month. She has not been able to ID a specific stimulus. Sometimes her maxalt helps, and sometimes it doesn't. We have tried anticonvulsants in the past.   She remains on her MS contin and oxycodone.   [name] has cut back to about 5 cigarettes per day. She and her husband are both tryting to quit.   Pain Inventory Average Pain 8 Pain Right Now 7 My pain is constant, sharp, burning, dull, stabbing, tingling and aching  In the last 24 hours, has pain interfered with the following? General activity 9 Relation with others 9 Enjoyment of life 9 What TIME of day is your pain at its worst? varies Sleep (in general) Poor  Pain is worse with: walking, bending, standing and some activites Pain improves with: rest, heat/ice, therapy/exercise, medication, TENS and injections Relief from Meds: 7  Mobility walk with assistance use a cane how many minutes can you walk? 5 ability to climb steps?  yes do you drive?  yes Do you have any goals in this area?  yes  Function disabled: date disabled 02/2005 I need assistance with the following:  bathing, meal prep, household duties and shopping Do you have any goals in this area?  yes  Neuro/Psych bladder control problems weakness tremor tingling trouble walking spasms dizziness confusion depression anxiety  Prior Studies Any changes since last visit?  no  Physicians involved in your care Any changes since last  visit?  no   Family History  Problem Relation Age of Onset  . Depression Mother   . Hypertension Mother   . Hypertension Father   . Heart failure Father   . Diabetes Father   . Heart attack Father   . Colon cancer Neg Hx   . Esophageal cancer Neg Hx   . Stomach cancer Neg Hx   . Rectal cancer Neg Hx    History   Social History  . Marital Status: Married    Spouse Name: [name]    Number of Children: 1  . Years of Education: some colle   Occupational History  . On disability    Social History Main Topics  . Smoking status: Former Smoker -- 0.50 packs/day for 39 years    Types: Cigarettes    Quit date: 02/03/2012  . Smokeless tobacco: Never Used     Comment: going to try e-sticks to quit  . Alcohol Use: No  . Drug Use: No  . Sexual Activity: Not Currently    Partners: Male   Other Topics Concern  . None   Social History Narrative   8-10 cups of soda a day.  On disability. No regular exercise.     Past Surgical History  Procedure Laterality Date  . Tonsillectomy  09/1972  . Ulnar nerve entrapment  Feb 1995    left elbow  . Carpal tunnel release  07/1993    both hands  . Tubal ligation  Sept 1999  .  Hemorroidectomy  July 2003  . Knee arthroplasty  09/2001, 05/2003    left knee, chondromalasia  . Abdominal hysterectomy  May 2004  . Replacement total knee  Nov 2005    Left   . Thyroid lobectomy  01/2005    malignant area removed from right lobe  . Basel cell carcinoma  06/2005, 07/2005    nasal tip and reconstruction  . Knee arthroscopy  jan 2005    Right knee, tisse release, chrondromalacia  . Colonoscopy    . Thyroid surgery      thyroid lobe removal   Past Medical History  Diagnosis Date  . Chronic pain syndrome   . Intervertebral lumbar disc disorder with myelopathy, lumbar region   . Facet syndrome, lumbar   . Primary localized osteoarthrosis, lower leg   . Degeneration of lumbar or lumbosacral intervertebral disc   . Unspecified musculoskeletal  disorders and symptoms referable to neck     cervical/trapezius  . Migraine without aura, with intractable migraine, so stated, without mention of status migrainosus   . Depression   . Lumbosacral spondylosis without myelopathy   . Allergy   . Anxiety   . Cancer     cancerous nodules on thyroid  . Cancer     basal cell Cancer-nose  . COPD (chronic obstructive pulmonary disease)   . GERD (gastroesophageal reflux disease)   . Hyperlipidemia   . Thyroid disease     hypothyroid   BP 157/87  Pulse 125  Resp 14  Ht 5\' 9"  (1.753 m)  Wt 245 lb (111.131 kg)  BMI 36.16 kg/m2  SpO2 94%  Opioid Risk Score:   Fall Risk Score: Moderate Fall Risk (6-13 points) (pt educated, declined handout)    Review of Systems  Constitutional: Positive for diaphoresis, appetite change and unexpected weight change.  Cardiovascular: Positive for leg swelling.  Gastrointestinal: Positive for nausea, vomiting and constipation.  Genitourinary:       Bladder control problems  Musculoskeletal: Positive for back pain and gait problem.  Neurological: Positive for dizziness, tremors and weakness.       Tingling, spasms  Psychiatric/Behavioral: Positive for confusion. The patient is nervous/anxious.        Depression  All other systems reviewed and are negative.      Objective:   Physical Exam  Constitutional: She is oriented to person, place, and time. She appears well-developed.  HENT:  Head: Normocephalic.  Eyes: Pupils are equal, round, and reactive to light.  Neck: Normal range of motion.  Neurological: She is alert and oriented to person, place, and time.  Skin: Skin is warm and dry.  Psychiatric: She has a normal mood and affect.  Symmetric normal motor tone is noted throughout. Normal muscle bulk. Muscle testing reveals 5/5 muscle strength of the upper extremity, and 5/5 of the lower extremity, except left quadriceps 4/5. Full range of motion in upper and lower extremities, except left knee  restricted in extension largely due to pain. Both knees are slightly erythematous without substantial swelling or warmth. Both knees are allodynic.Marland Kitchen ROM of spine is restricted. Left medial scapular border is tender. She has a palpable triggerpoint along the left lower rhomboids/paraspinal. RTC maneuvers are negative DTR in the upper and lower extremity are present and symmetric 1+. No clonus is noted.  Wide based gait with cane and forward flexed spine.  Assessment & Plan:   1. Chronic low back pain.  2. Facet arthropathy.  3. Degenerative disk disease.  4. Osteoarthritis of the  knees.  5. Migraine headaches with aura, last botox injection helped with decreasing the duration of her migraines  6. Myofascial pain. Left rhomboid 7. RSD? left lower extremity and right near knees---TP Bone Scan does not support this dx however.   PLAN:  1. Refilled MS Contin 100 mg 1 p.o. q.12 h., 60 with no refill. . She has been stable with these meds. Encouraged ongoing activity as tolerated.  2. Oxycodone 15 mg 1 p.o. q.6 h. p.r.n., 120 with no refill .  3. Continue with gabapentin 800mg  qid for her centralized pain.discussed perhaps another anticonvulsant trial for migraine prophylaxis, but i asked that she accomplish complete smoking cessation first..  4. mainatin pamelor to 100mg  qhs   5. Can continue custom compounded cream, ketamine 4%, ketoprofen 20%, baclofen 5%, elavil 5% for neuropathic knee pain.  6. After informed consent and preparation of the skin with isopropyl alcohol, I injected the left lower rhomboid with 2cc of 1% lidocaine. The patient tolerated well, and no complications were experienced. Post-injection instructions were provided.    She will follow up in a about a Month with our NP.

## 2013-09-30 NOTE — Patient Instructions (Signed)
PLEASE CALL ME WITH ANY PROBLEMS OR QUESTIONS (#[phone]).      

## 2013-10-02 ENCOUNTER — Telehealth: Payer: Self-pay

## 2013-10-02 MED ORDER — RIZATRIPTAN BENZOATE 10 MG PO TABS: 10.0000 mg | ORAL_TABLET | ORAL | Status: DC | PRN

## 2013-10-02 NOTE — Telephone Encounter (Signed)
Pharmacy request received to refill Rizatriptan. Refill sent to pharmacy.

## 2013-10-08 ENCOUNTER — Other Ambulatory Visit: Payer: Self-pay

## 2013-10-08 DIAGNOSIS — F32A Depression, unspecified: Secondary | ICD-10-CM

## 2013-10-08 DIAGNOSIS — F329 Major depressive disorder, single episode, unspecified: Secondary | ICD-10-CM

## 2013-10-08 MED ORDER — GABAPENTIN 800 MG PO TABS: 800.0000 mg | ORAL_TABLET | Freq: Four times a day (QID) | ORAL | Status: DC

## 2013-10-09 ENCOUNTER — Other Ambulatory Visit: Payer: Self-pay | Admitting: Family Medicine

## 2013-10-16 ENCOUNTER — Other Ambulatory Visit: Payer: Self-pay

## 2013-10-16 MED ORDER — CITALOPRAM HYDROBROMIDE 40 MG PO TABS: ORAL_TABLET | ORAL | Status: DC

## 2013-10-29 ENCOUNTER — Encounter: Payer: Self-pay | Admitting: Registered Nurse

## 2013-10-29 ENCOUNTER — Encounter: Payer: Medicare Other | Attending: Physical Medicine and Rehabilitation | Admitting: Registered Nurse

## 2013-10-29 VITALS — BP 157/99 | HR 110 | Resp 14 | Ht 69.0 in | Wt 250.0 lb

## 2013-10-29 DIAGNOSIS — M5136 Other intervertebral disc degeneration, lumbar region: Secondary | ICD-10-CM

## 2013-10-29 DIAGNOSIS — M7918 Myalgia, other site: Secondary | ICD-10-CM

## 2013-10-29 DIAGNOSIS — IMO0001 Reserved for inherently not codable concepts without codable children: Secondary | ICD-10-CM

## 2013-10-29 DIAGNOSIS — Z79899 Other long term (current) drug therapy: Secondary | ICD-10-CM

## 2013-10-29 DIAGNOSIS — M1710 Unilateral primary osteoarthritis, unspecified knee: Secondary | ICD-10-CM

## 2013-10-29 DIAGNOSIS — M5137 Other intervertebral disc degeneration, lumbosacral region: Secondary | ICD-10-CM

## 2013-10-29 DIAGNOSIS — M1711 Unilateral primary osteoarthritis, right knee: Secondary | ICD-10-CM

## 2013-10-29 DIAGNOSIS — M1712 Unilateral primary osteoarthritis, left knee: Secondary | ICD-10-CM

## 2013-10-29 DIAGNOSIS — Z5181 Encounter for therapeutic drug level monitoring: Secondary | ICD-10-CM

## 2013-10-29 DIAGNOSIS — G90522 Complex regional pain syndrome I of left lower limb: Secondary | ICD-10-CM

## 2013-10-29 DIAGNOSIS — G90529 Complex regional pain syndrome I of unspecified lower limb: Secondary | ICD-10-CM

## 2013-10-29 DIAGNOSIS — G43119 Migraine with aura, intractable, without status migrainosus: Secondary | ICD-10-CM | POA: Insufficient documentation

## 2013-10-29 MED ORDER — OXYCODONE HCL 15 MG PO TABS: 15.0000 mg | ORAL_TABLET | Freq: Four times a day (QID) | ORAL | Status: DC | PRN

## 2013-10-29 MED ORDER — MORPHINE SULFATE ER 100 MG PO TBCR: 100.0000 mg | EXTENDED_RELEASE_TABLET | Freq: Two times a day (BID) | ORAL | Status: DC

## 2013-10-29 NOTE — Addendum Note (Signed)
Addended by: Caro Hight on: 10/29/2013 11:10 AM   Modules accepted: Orders

## 2013-10-29 NOTE — Progress Notes (Signed)
Subjective:    Patient ID: [name], female    DOB: 1960/09/02, 53 y.o.   MRN: [id number]  HPI: [name] is a 53 year old female who returns for follow up for chronic pain and medication refill. She says her pain is located in her mid and lower back, bilateral hips and bilateral knees. She says she is getting over a migraine. She says they are lasting longer, she says this one lasted 4 days. The Maxalt helps at times. She rates her pain 7.  Status Post Lidocaine injection into left lower rhomboid with good relief noted. Her current exercise regime is performing chair exercises daily. She and her husband has stopped smoking, its been a week so far.  Pain Inventory Average Pain 8 Pain Right Now 7 My pain is constant, sharp, burning, dull, stabbing, tingling and aching  In the last 24 hours, has pain interfered with the following? General activity 9 Relation with others 9 Enjoyment of life 9 What TIME of day is your pain at its worst? All Sleep (in general) Poor  Pain is worse with: walking, bending, standing and some activites Pain improves with: rest, heat/ice, medication, TENS and injections Relief from Meds: 7  Mobility use a cane  Function disabled: date disabled 2004  Neuro/Psych bladder control problems weakness numbness tingling trouble walking spasms dizziness confusion depression anxiety  Prior Studies Any changes since last visit?  no  Physicians involved in your care Any changes since last visit?  no   Family History  Problem Relation Age of Onset  . Depression Mother   . Hypertension Mother   . Hypertension Father   . Heart failure Father   . Diabetes Father   . Heart attack Father   . Colon cancer Neg Hx   . Esophageal cancer Neg Hx   . Stomach cancer Neg Hx   . Rectal cancer Neg Hx    History   Social History  . Marital Status: Married    Spouse Name: [name]    Number of Children: 1  . Years of Education:  some colle   Occupational History  . On disability    Social History Main Topics  . Smoking status: Former Smoker -- 0.50 packs/day for 39 years    Types: Cigarettes    Quit date: 02/03/2012  . Smokeless tobacco: Never Used     Comment: going to try e-sticks to quit  . Alcohol Use: No  . Drug Use: No  . Sexual Activity: Not Currently    Partners: Male   Other Topics Concern  . None   Social History Narrative   8-10 cups of soda a day.  On disability. No regular exercise.     Past Surgical History  Procedure Laterality Date  . Tonsillectomy  09/1972  . Ulnar nerve entrapment  Feb 1995    left elbow  . Carpal tunnel release  07/1993    both hands  . Tubal ligation  Sept 1999  . Hemorroidectomy  July 2003  . Knee arthroplasty  09/2001, 05/2003    left knee, chondromalasia  . Abdominal hysterectomy  May 2004  . Replacement total knee  Nov 2005    Left   . Thyroid lobectomy  01/2005    malignant area removed from right lobe  . Basel cell carcinoma  06/2005, 07/2005    nasal tip and reconstruction  . Knee arthroscopy  jan 2005    Right knee, tisse release, chrondromalacia  .  Colonoscopy    . Thyroid surgery      thyroid lobe removal   Past Medical History  Diagnosis Date  . Chronic pain syndrome   . Intervertebral lumbar disc disorder with myelopathy, lumbar region   . Facet syndrome, lumbar   . Primary localized osteoarthrosis, lower leg   . Degeneration of lumbar or lumbosacral intervertebral disc   . Unspecified musculoskeletal disorders and symptoms referable to neck     cervical/trapezius  . Migraine without aura, with intractable migraine, so stated, without mention of status migrainosus   . Depression   . Lumbosacral spondylosis without myelopathy   . Allergy   . Anxiety   . Cancer     cancerous nodules on thyroid  . Cancer     basal cell Cancer-nose  . COPD (chronic obstructive pulmonary disease)   . GERD (gastroesophageal reflux disease)   .  Hyperlipidemia   . Thyroid disease     hypothyroid   BP 157/99  Pulse 110  Resp 14  Ht 5\' 9"  (1.753 m)  Wt 250 lb (113.399 kg)  BMI 36.90 kg/m2  SpO2 96%  Opioid Risk Score:   Fall Risk Score: Low Fall Risk (0-5 points)   Review of Systems     Objective:   Physical Exam  Nursing note and vitals reviewed. Constitutional: She is oriented to person, place, and time. She appears well-developed and well-nourished.  HENT:  Head: Normocephalic and atraumatic.  Neck: Normal range of motion. Neck supple.  Cardiovascular: Normal rate and regular rhythm.   Pulmonary/Chest: Effort normal and breath sounds normal.  Musculoskeletal:  Normal Muscle Bulk and Muscle Testing Reveals: Upper Extremities: Full ROM and Muscle strength 5/5 Thoracic Paraspinal Tenderness: T- 7- T-9 Lumbar Paraspinal Tenderness: L-3- L-4 Sacral Paraspinal Tenderness: S1 Lower Extremities: Full ROM and Muscle Strength 5/5 Bilateral Lower extremities Flexion Produces Pain Into Patella Arises from Chair with slight Difficulty/ Using 3 Prong cane for support Antalgic gait  Neurological: She is alert and oriented to person, place, and time.  Skin: Skin is warm and dry.  Psychiatric: She has a normal mood and affect.          Assessment & Plan:  1.Chronic Low Back Pain: Refilled: Oxycodone 15 mg one tablet every 6 hours as needed #120 and MS Contin 100 mg one tablet BID  2. Degenerative Disk Disease:Continue exercise, and heat therapy. Continue Current Medication Regime.  3. Osteoarthritis of Bilateral Knees: On Compound Ointment. Continue exercise and heat therapy.  4. Migraine Headaches: On Maxalt.  5. Smoking Cessation: Has Quit 7 days.  20 minutes of face to face patient care time was spent during this visit. All questions were encouraged and answered.   F/U in 1 month.

## 2013-11-26 ENCOUNTER — Encounter: Payer: Medicare Other | Attending: Physical Medicine and Rehabilitation | Admitting: *Deleted

## 2013-11-26 ENCOUNTER — Encounter: Payer: Medicare Other | Admitting: Registered Nurse

## 2013-11-26 VITALS — BP 161/83 | HR 111 | Resp 14 | Wt 260.0 lb

## 2013-11-26 DIAGNOSIS — Z760 Encounter for issue of repeat prescription: Secondary | ICD-10-CM | POA: Diagnosis not present

## 2013-11-26 DIAGNOSIS — M5136 Other intervertebral disc degeneration, lumbar region: Secondary | ICD-10-CM

## 2013-11-26 DIAGNOSIS — G90522 Complex regional pain syndrome I of left lower limb: Secondary | ICD-10-CM

## 2013-11-26 DIAGNOSIS — M7918 Myalgia, other site: Secondary | ICD-10-CM

## 2013-11-26 DIAGNOSIS — M1711 Unilateral primary osteoarthritis, right knee: Secondary | ICD-10-CM

## 2013-11-26 DIAGNOSIS — M1712 Unilateral primary osteoarthritis, left knee: Secondary | ICD-10-CM

## 2013-11-26 DIAGNOSIS — Z5181 Encounter for therapeutic drug level monitoring: Secondary | ICD-10-CM

## 2013-11-26 MED ORDER — OXYCODONE HCL 15 MG PO TABS: 15.0000 mg | ORAL_TABLET | Freq: Four times a day (QID) | ORAL | Status: DC | PRN

## 2013-11-26 MED ORDER — MORPHINE SULFATE ER 100 MG PO TBCR: 100.0000 mg | EXTENDED_RELEASE_TABLET | Freq: Two times a day (BID) | ORAL | Status: DC

## 2013-11-26 NOTE — Progress Notes (Signed)
Here for follow up med refill and pill count.  Pill count done and appropriate.  No falls and has been educated previously with handout was declined. Prescription given and will return in one month to see NP.

## 2013-12-02 ENCOUNTER — Other Ambulatory Visit: Payer: Self-pay | Admitting: *Deleted

## 2013-12-02 MED ORDER — PROMETHAZINE HCL 12.5 MG PO TABS: ORAL_TABLET | ORAL | Status: DC

## 2013-12-02 NOTE — Telephone Encounter (Signed)
recvd fax for Promethazine 12.5 mg from [facility]... Sent in electronically

## 2013-12-05 ENCOUNTER — Other Ambulatory Visit: Payer: Self-pay | Admitting: *Deleted

## 2013-12-05 DIAGNOSIS — M47816 Spondylosis without myelopathy or radiculopathy, lumbar region: Secondary | ICD-10-CM

## 2013-12-05 DIAGNOSIS — G9050 Complex regional pain syndrome I, unspecified: Secondary | ICD-10-CM

## 2013-12-05 DIAGNOSIS — M7918 Myalgia, other site: Secondary | ICD-10-CM

## 2013-12-05 MED ORDER — NORTRIPTYLINE HCL 50 MG PO CAPS: 100.0000 mg | ORAL_CAPSULE | Freq: Every day | ORAL | Status: DC

## 2013-12-11 ENCOUNTER — Other Ambulatory Visit: Payer: Self-pay | Admitting: *Deleted

## 2013-12-11 MED ORDER — TIZANIDINE HCL 2 MG PO TABS: 2.0000 mg | ORAL_TABLET | Freq: Three times a day (TID) | ORAL | Status: DC | PRN

## 2013-12-11 NOTE — Telephone Encounter (Signed)
Rec'd fax requesting refill Tizanidine HCL 2 mg tablet, filed electronic refill request per office protocol

## 2013-12-24 ENCOUNTER — Encounter: Payer: Self-pay | Admitting: Registered Nurse

## 2013-12-24 ENCOUNTER — Encounter: Payer: Medicare Other | Attending: Physical Medicine and Rehabilitation | Admitting: Registered Nurse

## 2013-12-24 VITALS — BP 141/77 | HR 98 | Resp 14 | Wt 251.6 lb

## 2013-12-24 DIAGNOSIS — M797 Fibromyalgia: Secondary | ICD-10-CM

## 2013-12-24 DIAGNOSIS — Z79899 Other long term (current) drug therapy: Secondary | ICD-10-CM

## 2013-12-24 DIAGNOSIS — M5136 Other intervertebral disc degeneration, lumbar region: Secondary | ICD-10-CM

## 2013-12-24 DIAGNOSIS — Z760 Encounter for issue of repeat prescription: Secondary | ICD-10-CM | POA: Diagnosis not present

## 2013-12-24 DIAGNOSIS — Z5181 Encounter for therapeutic drug level monitoring: Secondary | ICD-10-CM

## 2013-12-24 DIAGNOSIS — M7918 Myalgia, other site: Secondary | ICD-10-CM

## 2013-12-24 DIAGNOSIS — G90522 Complex regional pain syndrome I of left lower limb: Secondary | ICD-10-CM

## 2013-12-24 DIAGNOSIS — M1711 Unilateral primary osteoarthritis, right knee: Secondary | ICD-10-CM

## 2013-12-24 DIAGNOSIS — M1712 Unilateral primary osteoarthritis, left knee: Secondary | ICD-10-CM

## 2013-12-24 MED ORDER — OXYCODONE HCL 15 MG PO TABS: 15.0000 mg | ORAL_TABLET | Freq: Four times a day (QID) | ORAL | Status: DC | PRN

## 2013-12-24 MED ORDER — MORPHINE SULFATE ER 100 MG PO TBCR: 100.0000 mg | EXTENDED_RELEASE_TABLET | Freq: Two times a day (BID) | ORAL | Status: DC

## 2013-12-24 NOTE — Progress Notes (Signed)
Subjective:    Patient ID: [name], female    DOB: [birth year], 53 y.o.   MRN: [id number]  HPI: [name] is a 53 year old female who returns for follow up for chronic pain and medication refill. She says her pain is located in her neck, bilateral shoulders,lower back,left hip and bilateral knees. She had a migraine last week it lasted 5 days.The Maxalt helps at times. She rates her pain 6. Her current exercise regime is performing chair exercises every other day.  She and her husband are still working on not smoking, their down to 2-3 cigarettes a day. Encouraged to continue. She's having dental surgery on Friday 12/26/2013 instructed not to take the percocet that was prescribed, she has not picked it up. This has been verified via [place].   Script was printed via entry error and discarded.  Pain Inventory Average Pain 7 Pain Right Now 6 My pain is constant, sharp, burning, dull, stabbing, tingling and aching  In the last 24 hours, has pain interfered with the following? General activity 9 Relation with others 9 Enjoyment of life 9 What TIME of day is your pain at its worst? all Sleep (in general) Poor  Pain is worse with: walking, bending, standing and some activites Pain improves with: rest, heat/ice, therapy/exercise, pacing activities, medication, TENS and injections Relief from Meds: 6  Mobility use a cane ability to climb steps?  yes do you drive?  yes  Function disabled: date disabled . I need assistance with the following:  bathing, meal prep, household duties and shopping  Neuro/Psych weakness numbness tremor tingling trouble walking spasms dizziness confusion depression anxiety  Prior Studies Any changes since last visit?  no  Physicians involved in your care Any changes since last visit?  no   Family History  Problem Relation Age of Onset  . Depression Mother   .  Hypertension Mother   . Hypertension Father   . Heart failure Father   . Diabetes Father   . Heart attack Father   . Colon cancer Neg Hx   . Esophageal cancer Neg Hx   . Stomach cancer Neg Hx   . Rectal cancer Neg Hx    History   Social History  . Marital Status: Married    Spouse Name: [name]    Number of Children: 1  . Years of Education: some colle   Occupational History  . On disability    Social History Main Topics  . Smoking status: Former Smoker -- 0.50 packs/day for 39 years    Types: Cigarettes    Quit date: 02/03/2012  . Smokeless tobacco: Never Used     Comment: going to try e-sticks to quit  . Alcohol Use: No  . Drug Use: No  . Sexual Activity:    Partners: Male   Other Topics Concern  . None   Social History Narrative   8-10 cups of soda a day.  On disability. No regular exercise.     Past Surgical History  Procedure Laterality Date  . Tonsillectomy  09/1972  . Ulnar nerve entrapment  Feb 1995    left elbow  . Carpal tunnel release  07/1993    both hands  . Tubal ligation  Sept 1999  . Hemorroidectomy  July 2003  . Knee arthroplasty  09/2001, 05/2003    left knee, chondromalasia  . Abdominal hysterectomy  May 2004  . Replacement total knee  Nov 2005  Left   . Thyroid lobectomy  01/2005    malignant area removed from right lobe  . Basel cell carcinoma  06/2005, 07/2005    nasal tip and reconstruction  . Knee arthroscopy  jan 2005    Right knee, tisse release, chrondromalacia  . Colonoscopy    . Thyroid surgery      thyroid lobe removal   Past Medical History  Diagnosis Date  . Chronic pain syndrome   . Intervertebral lumbar disc disorder with myelopathy, lumbar region   . Facet syndrome, lumbar   . Primary localized osteoarthrosis, lower leg   . Degeneration of lumbar or lumbosacral intervertebral disc   . Unspecified musculoskeletal disorders and symptoms referable to neck     cervical/trapezius  . Migraine without aura, with  intractable migraine, so stated, without mention of status migrainosus   . Depression   . Lumbosacral spondylosis without myelopathy   . Allergy   . Anxiety   . Cancer     cancerous nodules on thyroid  . Cancer     basal cell Cancer-nose  . COPD (chronic obstructive pulmonary disease)   . GERD (gastroesophageal reflux disease)   . Hyperlipidemia   . Thyroid disease     hypothyroid   BP 141/77 mmHg  Pulse 98  Resp 14  Wt 251 lb 9.6 oz (114.125 kg)  SpO2 92%  Opioid Risk Score:   Fall Risk Score: Moderate Fall Risk (6-13 points) (previoulsy educated and given handout on fall  prevention in the home)  Review of Systems  Constitutional: Positive for diaphoresis, appetite change and unexpected weight change.  Gastrointestinal: Positive for nausea, vomiting and constipation.  Musculoskeletal: Positive for gait problem.       Spasms  Neurological: Positive for dizziness, tremors, weakness and numbness.       Tingling  Psychiatric/Behavioral: Positive for dysphoric mood. The patient is nervous/anxious.   All other systems reviewed and are negative.      Objective:   Physical Exam  Constitutional: She is oriented to person, place, and time. She appears well-developed and well-nourished.  HENT:  Head: Normocephalic and atraumatic.  Neck: Normal range of motion. Neck supple.  Cardiovascular: Normal rate and regular rhythm.   Pulmonary/Chest: Effort normal and breath sounds normal.  Musculoskeletal:  Normal Muscle Bulk and Muscle testing Reveals: Upper extremities: Full ROM and Muscle strength 5/5 Right Spine of Scapula Tenderness Lumbar Paraspinal Tenderness: L-3- L-5 Lower Extremities: Full ROM and Muscle strength 5/5 Bilateral Lower extremities Flexion Produces Pain into Patella. Patella with swelling and tenderness with palpation. Arises from chair with ease Antalgic gait   Neurological: She is alert and oriented to person, place, and time.  Skin: Skin is warm and dry.    Psychiatric: She has a normal mood and affect.  Nursing note and vitals reviewed.         Assessment & Plan:  1.Chronic Low Back Pain: Refilled: Oxycodone 15 mg one tablet every 6 hours as needed #120 and MS Contin 100 mg one tablet BID  2. Degenerative Disk Disease:Continue exercise, and heat therapy. Continue Current Medication Regime.  3. Osteoarthritis of Bilateral Knees: On Compound Ointment. Continue exercise and heat therapy.  4. Migraine Headaches: On Maxalt.  5. Smoking Cessation: Cutting Back: Encouraged to Continue  20 minutes of face to face patient care time was spent during this visit. All questions were encouraged and answered.   F/U in 1 month.

## 2014-01-12 ENCOUNTER — Ambulatory Visit: Payer: Medicare Other | Admitting: Family Medicine

## 2014-01-16 ENCOUNTER — Ambulatory Visit: Payer: Medicare Other | Admitting: Family Medicine

## 2014-01-22 ENCOUNTER — Encounter: Payer: Self-pay | Admitting: Family Medicine

## 2014-01-22 ENCOUNTER — Ambulatory Visit (INDEPENDENT_AMBULATORY_CARE_PROVIDER_SITE_OTHER): Payer: Medicare Other | Admitting: Family Medicine

## 2014-01-22 VITALS — BP 122/83 | HR 114 | Ht 69.5 in | Wt 246.0 lb

## 2014-01-22 DIAGNOSIS — E041 Nontoxic single thyroid nodule: Secondary | ICD-10-CM

## 2014-01-22 DIAGNOSIS — Z85850 Personal history of malignant neoplasm of thyroid: Secondary | ICD-10-CM

## 2014-01-22 DIAGNOSIS — E785 Hyperlipidemia, unspecified: Secondary | ICD-10-CM

## 2014-01-22 DIAGNOSIS — Z720 Tobacco use: Secondary | ICD-10-CM

## 2014-01-22 MED ORDER — LEVOTHYROXINE SODIUM 25 MCG PO TABS: ORAL_TABLET | ORAL | Status: DC

## 2014-01-22 MED ORDER — VARENICLINE TARTRATE 0.5 MG X 11 & 1 MG X 42 PO MISC: ORAL | Status: DC

## 2014-01-22 MED ORDER — PRAVASTATIN SODIUM 40 MG PO TABS: 40.0000 mg | ORAL_TABLET | Freq: Every day | ORAL | Status: DC

## 2014-01-22 NOTE — Progress Notes (Signed)
   Subjective:    Patient ID: [name], female    DOB: [birth year], 53 y.o.   MRN: [id number]  HPI Tobacco abuse-she would like assessed to discuss getting on Chantix.  Says not sure it worked last time.  She was smoking 2.5 ppd last time tried to quit. She is now down to half a pack a day.  Her husband is quitting at the same time. Her duaghter has quit as well. Says has been doing adult coloring books to help her reduce stress.    Hyperlipidemia-she's currently on pravastatin. She is due to repeat lab work. No myalgias or S.E.   Hypothyroidism-no recent skin or hair changes. No weight changes. She does have a prior history of thyroid lobe being removed for thyroid cancer. She says her last ultrasound was 2 years ago and she know she is overdue. She would like to schedule that as soon as possible.   Review of Systems     Objective:   Physical Exam  Constitutional: She is oriented to person, place, and time. She appears well-developed and well-nourished.  HENT:  Head: Normocephalic and atraumatic.  Neck: Neck supple. No thyromegaly present.  Cardiovascular: Normal rate, regular rhythm and normal heart sounds.   Pulmonary/Chest: Effort normal and breath sounds normal.  Lymphadenopathy:    She has no cervical adenopathy.  Neurological: She is alert and oriented to person, place, and time.  Skin: Skin is warm and dry.  Psychiatric: She has a normal mood and affect. Her behavior is normal.          Assessment & Plan:  Tobacco abuse-she is willing to try the Chantix again and see if it was helpful. She was able to quit for 3 months when she used it about a year and a half ago. She did not have any side effects at that time. Reminded her about potential side effects and strategies to continue to cut back. Next  Hyperlipidemia-due to repeat lipid panel. Continue with pravastatin. Call with results once available. We'll also check liver enzymes. Next  Hypothyroidism/history  of thyroid cancer-due to recheck TSH just make sure the levels are adequate. Her last level was done over a year ago. She promises that she will have it drawn today. We'll go ahead and schedule for ultrasound evaluation as well. She did have a small nodule 2 years ago that was less than 5 mm.

## 2014-01-27 ENCOUNTER — Encounter: Payer: Medicare Other | Attending: Physical Medicine and Rehabilitation | Admitting: Registered Nurse

## 2014-01-27 ENCOUNTER — Encounter: Payer: Self-pay | Admitting: Registered Nurse

## 2014-01-27 VITALS — BP 125/94 | HR 105 | Resp 14

## 2014-01-27 DIAGNOSIS — M5136 Other intervertebral disc degeneration, lumbar region: Secondary | ICD-10-CM

## 2014-01-27 DIAGNOSIS — G90522 Complex regional pain syndrome I of left lower limb: Secondary | ICD-10-CM

## 2014-01-27 DIAGNOSIS — M797 Fibromyalgia: Secondary | ICD-10-CM

## 2014-01-27 DIAGNOSIS — M1711 Unilateral primary osteoarthritis, right knee: Secondary | ICD-10-CM

## 2014-01-27 DIAGNOSIS — Z79899 Other long term (current) drug therapy: Secondary | ICD-10-CM

## 2014-01-27 DIAGNOSIS — Z5181 Encounter for therapeutic drug level monitoring: Secondary | ICD-10-CM

## 2014-01-27 DIAGNOSIS — M1712 Unilateral primary osteoarthritis, left knee: Secondary | ICD-10-CM

## 2014-01-27 DIAGNOSIS — M7918 Myalgia, other site: Secondary | ICD-10-CM

## 2014-01-27 DIAGNOSIS — M7062 Trochanteric bursitis, left hip: Secondary | ICD-10-CM

## 2014-01-27 DIAGNOSIS — Z760 Encounter for issue of repeat prescription: Secondary | ICD-10-CM | POA: Diagnosis not present

## 2014-01-27 MED ORDER — METHYLPREDNISOLONE 4 MG PO KIT: PACK | ORAL | Status: DC

## 2014-01-27 MED ORDER — MORPHINE SULFATE ER 100 MG PO TBCR: 100.0000 mg | EXTENDED_RELEASE_TABLET | Freq: Two times a day (BID) | ORAL | Status: DC

## 2014-01-27 MED ORDER — OXYCODONE HCL 15 MG PO TABS: 15.0000 mg | ORAL_TABLET | Freq: Four times a day (QID) | ORAL | Status: DC | PRN

## 2014-01-27 NOTE — Progress Notes (Signed)
Subjective:    Patient ID: [name], female    DOB: 1960/04/17, 53 y.o.   MRN: [id number]  HPI: [name] is a 52 year old female who returns for follow up for chronic pain and medication refill. She says her pain is located in her lower back,left hip and bilateral knees right greater than left. She rates her pain 7. Her current exercise regime is performing chair exercises every other day.  She still working on not smoking, she's smokingto 2-3 cigarettes a day. Started Chantix her quit smoking day 01/28/14. Encouraged to continue.  Pain Inventory Average Pain 7 Pain Right Now 7 My pain is constant, sharp, burning, dull, stabbing, tingling and aching  In the last 24 hours, has pain interfered with the following? General activity 9 Relation with others 9 Enjoyment of life 9 What TIME of day is your pain at its worst? all Sleep (in general) Poor  Pain is worse with: walking, bending, standing and some activites Pain improves with: rest, heat/ice, therapy/exercise, medication, TENS and injections Relief from Meds: 7  Mobility walk with assistance use a cane how many minutes can you walk? 5-7 ability to climb steps?  yes do you drive?  yes Do you have any goals in this area?  yes  Function not employed: date last employed 02/14/2003 disabled: date disabled 08/2005 I need assistance with the following:  bathing, meal prep, household duties and shopping Do you have any goals in this area?  yes  Neuro/Psych weakness numbness tremor tingling trouble walking spasms dizziness confusion depression anxiety  Prior Studies Any changes since last visit?  no  Physicians involved in your care Any changes since last visit?  no   Family History  Problem Relation Age of Onset  . Depression Mother   . Hypertension Mother   . Hypertension Father   . Heart failure Father   . Diabetes Father   . Heart attack Father   . Colon cancer Neg Hx   .  Esophageal cancer Neg Hx   . Stomach cancer Neg Hx   . Rectal cancer Neg Hx    History   Social History  . Marital Status: Married    Spouse Name: [name]    Number of Children: 1  . Years of Education: some colle   Occupational History  . On disability    Social History Main Topics  . Smoking status: Former Smoker -- 0.50 packs/day for 39 years    Types: Cigarettes    Quit date: 02/03/2012  . Smokeless tobacco: Never Used     Comment: going to try e-sticks to quit  . Alcohol Use: No  . Drug Use: No  . Sexual Activity:    Partners: Male   Other Topics Concern  . None   Social History Narrative   8-10 cups of soda a day.  On disability. No regular exercise.     Past Surgical History  Procedure Laterality Date  . Tonsillectomy  09/1972  . Ulnar nerve entrapment  Feb 1995    left elbow  . Carpal tunnel release  07/1993    both hands  . Tubal ligation  Sept 1999  . Hemorroidectomy  July 2003  . Knee arthroplasty  09/2001, 05/2003    left knee, chondromalasia  . Abdominal hysterectomy  May 2004  . Replacement total knee  Nov 2005    Left   . Thyroid lobectomy  01/2005    malignant area removed from right lobe  .  Basel cell carcinoma  06/2005, 07/2005    nasal tip and reconstruction  . Knee arthroscopy  jan 2005    Right knee, tisse release, chrondromalacia  . Colonoscopy    . Thyroid surgery      thyroid lobe removal   Past Medical History  Diagnosis Date  . Chronic pain syndrome   . Intervertebral lumbar disc disorder with myelopathy, lumbar region   . Facet syndrome, lumbar   . Primary localized osteoarthrosis, lower leg   . Degeneration of lumbar or lumbosacral intervertebral disc   . Unspecified musculoskeletal disorders and symptoms referable to neck     cervical/trapezius  . Migraine without aura, with intractable migraine, so stated, without mention of status migrainosus   . Depression   . Lumbosacral spondylosis without myelopathy   . Allergy   .  Anxiety   . Cancer     cancerous nodules on thyroid  . Cancer     basal cell Cancer-nose  . COPD (chronic obstructive pulmonary disease)   . GERD (gastroesophageal reflux disease)   . Hyperlipidemia   . Thyroid disease     hypothyroid   BP 125/94 mmHg  Pulse 105  Resp 14  SpO2 93%  Opioid Risk Score:   Fall Risk Score: Moderate Fall Risk (6-13 points) (pt given fall prevention pamphlet today) Review of Systems  Neurological: Positive for dizziness, tremors, weakness and numbness.       Spasms Tingling   Psychiatric/Behavioral: Positive for confusion and dysphoric mood. The patient is nervous/anxious.        Panic attacks  All other systems reviewed and are negative.      Objective:   Physical Exam  Constitutional: She is oriented to person, place, and time. She appears well-developed and well-nourished.  HENT:  Head: Normocephalic and atraumatic.  Neck: Normal range of motion. Neck supple.  Cardiovascular: Normal rate and regular rhythm.   Pulmonary/Chest: Effort normal and breath sounds normal.  Musculoskeletal:  Normal Muscle Bulk and Muscle Testing Reveals: Upper Extremities: Full ROM and Muscle strength 5/5 Left Spine of Scapula Tenderness Thoracic Paraspinal Tenderness: T-7- T-10 Mainly Left Side Left Greater Trochanteric Tenderness Lower Extremities: Full ROM and Muscle strength 5/5 Right Leg Flexion Produces pain into Lumbar and Patella Left Leg Flexion Produces Pain into Left Hip Arises from chair with slight difficulty/ Using three prong cane for support Antalgic Gait   Neurological: She is alert and oriented to person, place, and time.  Skin: Skin is warm and dry.  Psychiatric: She has a normal mood and affect.  Nursing note and vitals reviewed.         Assessment & Plan:  1.Chronic Low Back Pain: Refilled: Oxycodone 15 mg one tablet every 6 hours as needed #120 and MS Contin 100 mg one tablet BID  2. Degenerative Disk Disease:Continue  exercise, and heat therapy. Continue Current Medication Regime.  3. Osteoarthritis of Bilateral Knees: On Compound Ointment. Continue exercise and heat therapy.  4. Migraine Headaches: On Maxalt.  5. Smoking Cessation: Cutting Back: Encouraged to Continue 6. Left Greater Trochanteric Bursitis: RX: Medrol Dose Pak  20 minutes of face to face patient care time was spent during this visit. All questions were encouraged and answered.   F/U in 1 month.

## 2014-02-23 ENCOUNTER — Encounter: Payer: Medicare Other | Attending: Physical Medicine and Rehabilitation | Admitting: Registered Nurse

## 2014-02-23 ENCOUNTER — Encounter: Payer: Self-pay | Admitting: Registered Nurse

## 2014-02-23 ENCOUNTER — Other Ambulatory Visit: Payer: Self-pay | Admitting: Physical Medicine & Rehabilitation

## 2014-02-23 VITALS — BP 98/65 | HR 97 | Resp 14

## 2014-02-23 DIAGNOSIS — G894 Chronic pain syndrome: Secondary | ICD-10-CM | POA: Diagnosis not present

## 2014-02-23 DIAGNOSIS — Z5181 Encounter for therapeutic drug level monitoring: Secondary | ICD-10-CM | POA: Diagnosis not present

## 2014-02-23 DIAGNOSIS — M1712 Unilateral primary osteoarthritis, left knee: Secondary | ICD-10-CM | POA: Diagnosis not present

## 2014-02-23 DIAGNOSIS — Z760 Encounter for issue of repeat prescription: Secondary | ICD-10-CM | POA: Insufficient documentation

## 2014-02-23 DIAGNOSIS — G90522 Complex regional pain syndrome I of left lower limb: Secondary | ICD-10-CM

## 2014-02-23 DIAGNOSIS — Z79899 Other long term (current) drug therapy: Secondary | ICD-10-CM | POA: Diagnosis not present

## 2014-02-23 DIAGNOSIS — M1711 Unilateral primary osteoarthritis, right knee: Secondary | ICD-10-CM

## 2014-02-23 MED ORDER — MORPHINE SULFATE ER 100 MG PO TBCR: 100.0000 mg | EXTENDED_RELEASE_TABLET | Freq: Two times a day (BID) | ORAL | Status: DC

## 2014-02-23 MED ORDER — OXYCODONE HCL 15 MG PO TABS: 15.0000 mg | ORAL_TABLET | Freq: Four times a day (QID) | ORAL | Status: DC | PRN

## 2014-02-23 NOTE — Progress Notes (Signed)
Subjective:    Patient ID: [name], female    DOB: 11-Jun-1960, 54 y.o.   MRN: [id number]  HPI: [name] is a 54 year old female who returns for follow up for chronic pain and medication refill. She says her pain is located in her left shoulder, mid- lower back,  and bilateral knees right greater than left. She rates her pain 8. Her current exercise regime is performing chair exercises every other day and walking.  She still working on not smoking, she's smokingto 2-5 cigarettes a day. Continues  Chantix. Encouraged to continue.   Pain Inventory Average Pain 8 Pain Right Now 8 My pain is constant, sharp, burning, dull, stabbing, tingling and aching  In the last 24 hours, has pain interfered with the following? General activity 9 Relation with others 9 Enjoyment of life 9 What TIME of day is your pain at its worst? all Sleep (in general) Poor  Pain is worse with: walking, bending, standing and some activites Pain improves with: rest, heat/ice, therapy/exercise, medication, TENS and injections Relief from Meds: 7  Mobility walk with assistance use a cane how many minutes can you walk? 5+ ability to climb steps?  yes do you drive?  yes Do you have any goals in this area?  yes  Function not employed: date last employed 02/24/03 disabled: date disabled 08/2005 I need assistance with the following:  bathing, meal prep, household duties and shopping Do you have any goals in this area?  yes  Neuro/Psych weakness numbness tremor tingling trouble walking spasms dizziness confusion depression  Prior Studies Any changes since last visit?  no  Physicians involved in your care Any changes since last visit?  no   Family History  Problem Relation Age of Onset  . Depression Mother   . Hypertension Mother   . Hypertension Father   . Heart failure Father   . Diabetes Father   . Heart attack Father   . Colon cancer Neg Hx   . Esophageal  cancer Neg Hx   . Stomach cancer Neg Hx   . Rectal cancer Neg Hx    History   Social History  . Marital Status: Married    Spouse Name: [name]    Number of Children: 1  . Years of Education: some colle   Occupational History  . On disability    Social History Main Topics  . Smoking status: Former Smoker -- 0.50 packs/day for 39 years    Types: Cigarettes    Quit date: 02/03/2012  . Smokeless tobacco: Never Used     Comment: going to try e-sticks to quit  . Alcohol Use: No  . Drug Use: No  . Sexual Activity:    Partners: Male   Other Topics Concern  . None   Social History Narrative   8-10 cups of soda a day.  On disability. No regular exercise.     Past Surgical History  Procedure Laterality Date  . Tonsillectomy  09/1972  . Ulnar nerve entrapment  Feb 1995    left elbow  . Carpal tunnel release  07/1993    both hands  . Tubal ligation  Sept 1999  . Hemorroidectomy  July 2003  . Knee arthroplasty  09/2001, 05/2003    left knee, chondromalasia  . Abdominal hysterectomy  May 2004  . Replacement total knee  Nov 2005    Left   . Thyroid lobectomy  01/2005    malignant area removed from right  lobe  . Basel cell carcinoma  06/2005, 07/2005    nasal tip and reconstruction  . Knee arthroscopy  jan 2005    Right knee, tisse release, chrondromalacia  . Colonoscopy    . Thyroid surgery      thyroid lobe removal   Past Medical History  Diagnosis Date  . Chronic pain syndrome   . Intervertebral lumbar disc disorder with myelopathy, lumbar region   . Facet syndrome, lumbar   . Primary localized osteoarthrosis, lower leg   . Degeneration of lumbar or lumbosacral intervertebral disc   . Unspecified musculoskeletal disorders and symptoms referable to neck     cervical/trapezius  . Migraine without aura, with intractable migraine, so stated, without mention of status migrainosus   . Depression   . Lumbosacral spondylosis without myelopathy   . Allergy   . Anxiety   .  Cancer     cancerous nodules on thyroid  . Cancer     basal cell Cancer-nose  . COPD (chronic obstructive pulmonary disease)   . GERD (gastroesophageal reflux disease)   . Hyperlipidemia   . Thyroid disease     hypothyroid   BP 98/65 mmHg  Pulse 97  Resp 14  SpO2 90%  Opioid Risk Score:   Fall Risk Score: Moderate Fall Risk (6-13 points)  Review of Systems  Constitutional:       Night sweats  weight gain Poor appetite   Cardiovascular: Positive for leg swelling.  Gastrointestinal: Positive for nausea, vomiting and constipation.  Musculoskeletal: Positive for joint swelling and gait problem.  Neurological: Positive for dizziness, tremors, weakness and numbness.       Tingling Spasms    Psychiatric/Behavioral: Positive for confusion and dysphoric mood. The patient is nervous/anxious.        Panic attacks  All other systems reviewed and are negative.      Objective:   Physical Exam  Constitutional: She is oriented to person, place, and time. She appears well-developed and well-nourished.  HENT:  Head: Normocephalic and atraumatic.  Neck: Normal range of motion. Neck supple.  Cardiovascular: Normal rate.   Pulmonary/Chest: Effort normal and breath sounds normal.  Musculoskeletal:  Normal Muscle Bulk and Muscle Testing Reveals: Upper Extremities: Full ROM and Muscle strength 5/5 Thoracic and Lumbar Hypersensitivity Lower Extremities: Full ROM and Muscle strength 5/5 Arises from chair with ease/ Using 3 prong cane Narrow Based Gait  Neurological: She is alert and oriented to person, place, and time.  Skin: Skin is warm and dry.  Psychiatric: She has a normal mood and affect.  Nursing note and vitals reviewed.         Assessment & Plan:  1.Chronic Low Back Pain: Refilled: Oxycodone 15 mg one tablet every 6 hours as needed #120 and MS Contin 100 mg one tablet BID  2. Degenerative Disk Disease:Continue exercise, and heat therapy. Continue Current Medication  Regime.  3. Osteoarthritis of Bilateral Knees: On Compound Ointment. Continue exercise and heat therapy.  4. Migraine Headaches: On Maxalt.  5. Smoking Cessation: Cutting Back: Encouraged to Continue 6. Left Greater Trochanteric Bursitis: No complaints today  20 minutes of face to face patient care time was spent during this visit. All questions were encouraged and answered.   F/U in 1 month.

## 2014-02-26 LAB — PMP ALCOHOL METABOLITE (ETG): ETGU: NEGATIVE ng/mL

## 2014-03-01 LAB — PRESCRIPTION MONITORING PROFILE (SOLSTAS)
AMPHETAMINE/METH: NEGATIVE ng/mL
BUPRENORPHINE, URINE: NEGATIVE ng/mL
Barbiturate Screen, Urine: NEGATIVE ng/mL
Benzodiazepine Screen, Urine: NEGATIVE ng/mL
Cannabinoid Scrn, Ur: NEGATIVE ng/mL
Carisoprodol, Urine: NEGATIVE ng/mL
Cocaine Metabolites: NEGATIVE ng/mL
Creatinine, Urine: 28.39 mg/dL (ref >20.0)
ECSTASY: NEGATIVE ng/mL
Fentanyl, Ur: NEGATIVE ng/mL
METHADONE SCREEN, URINE: NEGATIVE ng/mL
Meperidine, Ur: NEGATIVE ng/mL
Nitrites, Initial: NEGATIVE ug/mL
Propoxyphene: NEGATIVE ng/mL
Tapentadol, urine: NEGATIVE ng/mL
Tramadol Scrn, Ur: NEGATIVE ng/mL
Zolpidem, Urine: NEGATIVE ng/mL
pH, Initial: 5.7 pH (ref 4.5–8.9)

## 2014-03-01 LAB — OPIATES/OPIOIDS (LC/MS-MS)
Codeine Urine: NEGATIVE ng/mL (ref <50)
HYDROCODONE: NEGATIVE ng/mL (ref <50)
HYDROMORPHONE: 202 ng/mL (ref <50)
Morphine Urine: 22826 ng/mL (ref <50)
Norhydrocodone, Ur: NEGATIVE ng/mL (ref <50)
Noroxycodone, Ur: 1690 ng/mL (ref <50)
Oxycodone, ur: 2592 ng/mL (ref <50)
Oxymorphone: 648 ng/mL (ref <50)

## 2014-03-01 LAB — OXYCODONE, URINE (LC/MS-MS)
Noroxycodone, Ur: 1690 ng/mL (ref <50)
Oxycodone, ur: 2592 ng/mL (ref <50)
Oxymorphone: 648 ng/mL (ref <50)

## 2014-03-04 ENCOUNTER — Other Ambulatory Visit: Payer: Self-pay | Admitting: *Deleted

## 2014-03-04 DIAGNOSIS — F329 Major depressive disorder, single episode, unspecified: Secondary | ICD-10-CM

## 2014-03-04 DIAGNOSIS — F32A Depression, unspecified: Secondary | ICD-10-CM

## 2014-03-04 MED ORDER — GABAPENTIN 800 MG PO TABS: 800.0000 mg | ORAL_TABLET | Freq: Four times a day (QID) | ORAL | Status: DC

## 2014-03-04 NOTE — Telephone Encounter (Signed)
recvd fax refill request - sending electronically.

## 2014-03-17 NOTE — Progress Notes (Signed)
Urine drug screen for this encounter is consistent for prescribed medication 

## 2014-03-25 ENCOUNTER — Encounter: Payer: Medicare Other | Attending: Physical Medicine and Rehabilitation | Admitting: Physical Medicine & Rehabilitation

## 2014-03-25 ENCOUNTER — Encounter: Payer: Self-pay | Admitting: Physical Medicine & Rehabilitation

## 2014-03-25 VITALS — BP 152/86 | HR 106 | Resp 14

## 2014-03-25 DIAGNOSIS — M1712 Unilateral primary osteoarthritis, left knee: Secondary | ICD-10-CM

## 2014-03-25 DIAGNOSIS — G90522 Complex regional pain syndrome I of left lower limb: Secondary | ICD-10-CM

## 2014-03-25 DIAGNOSIS — G9050 Complex regional pain syndrome I, unspecified: Secondary | ICD-10-CM | POA: Diagnosis not present

## 2014-03-25 DIAGNOSIS — M47816 Spondylosis without myelopathy or radiculopathy, lumbar region: Secondary | ICD-10-CM

## 2014-03-25 DIAGNOSIS — Z760 Encounter for issue of repeat prescription: Secondary | ICD-10-CM | POA: Insufficient documentation

## 2014-03-25 DIAGNOSIS — G43119 Migraine with aura, intractable, without status migrainosus: Secondary | ICD-10-CM | POA: Diagnosis not present

## 2014-03-25 MED ORDER — MORPHINE SULFATE ER 100 MG PO TBCR: 100.0000 mg | EXTENDED_RELEASE_TABLET | Freq: Two times a day (BID) | ORAL | Status: DC

## 2014-03-25 MED ORDER — OXYCODONE HCL 15 MG PO TABS: 15.0000 mg | ORAL_TABLET | Freq: Four times a day (QID) | ORAL | Status: DC | PRN

## 2014-03-25 NOTE — Patient Instructions (Signed)
WORK ON DE-SENSITIZING YOUR LEG WITH DIFFERENT TEMPERATURE AND TEXTURE SUBSTANCES.  CONTINUE WORKING ON SMOKING CESSATION

## 2014-03-25 NOTE — Progress Notes (Signed)
Subjective:    Patient ID: [name], female    DOB: October 02, 1960, 54 y.o.   MRN: [id number]  HPI   Sanari is back regarding her chronic pain. She states things are fairly well maintained with her medications.  She is down to less than 10 cigarettes per day. She failed chantix. She is slowly decreasing the amounts.    Her left knee remains sensitive. She wears shorts often because the sensation of fabric on the knees is irritating. She likes to put ice on her knees. She doesn't wear any supportive garments.      Pain Inventory Average Pain 7 Pain Right Now 8 My pain is constant, sharp, burning, dull, stabbing, tingling and aching  In the last 24 hours, has pain interfered with the following? General activity 9 Relation with others 9 Enjoyment of life 10 What TIME of day is your pain at its worst? ALL Sleep (in general) Poor  Pain is worse with: walking, bending, standing and some activites Pain improves with: rest, heat/ice, therapy/exercise, pacing activities, medication, TENS and injections Relief from Meds: 7  Mobility walk without assistance how many minutes can you walk? 5-7 ability to climb steps?  yes do you drive?  yes  Function disabled: date disabled .  Neuro/Psych weakness tremor tingling trouble walking spasms dizziness confusion depression anxiety  Prior Studies Any changes since last visit?  no  Physicians involved in your care Any changes since last visit?  no   Family History  Problem Relation Age of Onset  . Depression Mother   . Hypertension Mother   . Hypertension Father   . Heart failure Father   . Diabetes Father   . Heart attack Father   . Colon cancer Neg Hx   . Esophageal cancer Neg Hx   . Stomach cancer Neg Hx   . Rectal cancer Neg Hx    History   Social History  . Marital Status: Married    Spouse Name: [name]  . Number of Children: 1  . Years of Education: some colle   Occupational History  . On  disability    Social History Main Topics  . Smoking status: Former Smoker -- 0.50 packs/day for 39 years    Types: Cigarettes    Quit date: 02/03/2012  . Smokeless tobacco: Never Used     Comment: going to try e-sticks to quit  . Alcohol Use: No  . Drug Use: No  . Sexual Activity:    Partners: Male   Other Topics Concern  . None   Social History Narrative   8-10 cups of soda a day.  On disability. No regular exercise.     Past Surgical History  Procedure Laterality Date  . Tonsillectomy  09/1972  . Ulnar nerve entrapment  Feb 1995    left elbow  . Carpal tunnel release  07/1993    both hands  . Tubal ligation  Sept 1999  . Hemorroidectomy  July 2003  . Knee arthroplasty  09/2001, 05/2003    left knee, chondromalasia  . Abdominal hysterectomy  May 2004  . Replacement total knee  Nov 2005    Left   . Thyroid lobectomy  01/2005    malignant area removed from right lobe  . Basel cell carcinoma  06/2005, 07/2005    nasal tip and reconstruction  . Knee arthroscopy  jan 2005    Right knee, tisse release, chrondromalacia  . Colonoscopy    . Thyroid surgery  thyroid lobe removal   Past Medical History  Diagnosis Date  . Chronic pain syndrome   . Intervertebral lumbar disc disorder with myelopathy, lumbar region   . Facet syndrome, lumbar   . Primary localized osteoarthrosis, lower leg   . Degeneration of lumbar or lumbosacral intervertebral disc   . Unspecified musculoskeletal disorders and symptoms referable to neck     cervical/trapezius  . Migraine without aura, with intractable migraine, so stated, without mention of status migrainosus   . Depression   . Lumbosacral spondylosis without myelopathy   . Allergy   . Anxiety   . Cancer     cancerous nodules on thyroid  . Cancer     basal cell Cancer-nose  . COPD (chronic obstructive pulmonary disease)   . GERD (gastroesophageal reflux disease)   . Hyperlipidemia   . Thyroid disease     hypothyroid   BP 152/86  mmHg  Pulse 106  Resp 14  SpO2 96%  Opioid Risk Score:   Fall Risk Score: Moderate Fall Risk (6-13 points) Review of Systems  HENT: Negative.   Eyes: Negative.   Respiratory: Negative.   Cardiovascular: Negative.   Gastrointestinal: Negative.   Endocrine: Negative.   Genitourinary: Negative.   Musculoskeletal: Positive for myalgias, back pain and arthralgias.  Skin: Positive for rash.       Skin sensitivity causing pain  Allergic/Immunologic: Negative.   Neurological: Positive for dizziness, tremors, weakness and numbness.       Tingling, trouble walking, spasms  Hematological: Negative.   Psychiatric/Behavioral: Positive for confusion and dysphoric mood. The patient is nervous/anxious.        Objective:   Physical Exam   Constitutional: She is oriented to person, place, and time. She appears well-developed.  HENT:  Head: Normocephalic.  Eyes: Pupils are equal, round, and reactive to light.  Neck: Normal range of motion.  Neurological: She is alert and oriented to person, place, and time.  Skin: Skin is warm and dry.  Psychiatric: She has a normal mood and affect.  Symmetric normal motor tone is noted throughout. Normal muscle bulk. Muscle testing reveals 5/5 muscle strength of the upper extremity, and 5/5 of the lower extremity, except left quadriceps 4/5. Full range of motion in upper and lower extremities, except left knee restricted in extension largely due to pain. Left knee more sensitive to touch than the right---not allodynic DTR in the upper and lower extremity are present and symmetric 1+. No clonus is noted.  Wide based gait with cane and forward flexed spine.  Psych: mood is pleasant and up beat  Assessment & Plan:   1. Chronic low back pain.  2. Facet arthropathy.  3. Degenerative disk disease.  4. Osteoarthritis of the knees.  5. Migraine headaches with aura, last botox injection helped with decreasing the duration of her migraines  6. Myofascial pain.  Left rhomboid  7. RSD? left lower extremity and right near knees---TP Bone Scan does not support this dx however.    PLAN:  1. Refilled MS Contin 100 mg 1 p.o. q.12 h., 60 with no refill. . She has been stable with these meds. Encouraged ongoing activity as tolerated.   2. Oxycodone 15 mg 1 p.o. q.6 h. p.r.n., 120 with no refill .  3. Continue with gabapentin 800mg  qid for her centralized pain.  4. Maintain pamelor to 100mg  qhs  5. Can continue custom compounded cream, ketamine 4%, ketoprofen 20%, baclofen 5%, elavil 5% for neuropathic knee pain. Asked her to work  on desensitization exercises for her left knee (and right) as much as possible. Made several suggestions for her today. 6. She will follow up in a about a Month with our NP.

## 2014-04-08 ENCOUNTER — Other Ambulatory Visit: Payer: Self-pay | Admitting: *Deleted

## 2014-04-08 DIAGNOSIS — G9050 Complex regional pain syndrome I, unspecified: Secondary | ICD-10-CM

## 2014-04-08 DIAGNOSIS — M47816 Spondylosis without myelopathy or radiculopathy, lumbar region: Secondary | ICD-10-CM

## 2014-04-08 DIAGNOSIS — M7918 Myalgia, other site: Secondary | ICD-10-CM

## 2014-04-08 MED ORDER — NORTRIPTYLINE HCL 50 MG PO CAPS: 100.0000 mg | ORAL_CAPSULE | Freq: Every day | ORAL | Status: DC

## 2014-04-20 ENCOUNTER — Other Ambulatory Visit: Payer: Self-pay | Admitting: *Deleted

## 2014-04-20 MED ORDER — CITALOPRAM HYDROBROMIDE 40 MG PO TABS: ORAL_TABLET | ORAL | Status: DC

## 2014-04-21 ENCOUNTER — Encounter: Payer: Medicare Other | Attending: Physical Medicine and Rehabilitation | Admitting: Registered Nurse

## 2014-04-21 ENCOUNTER — Encounter: Payer: Self-pay | Admitting: Registered Nurse

## 2014-04-21 VITALS — BP 123/73 | HR 85 | Resp 16

## 2014-04-21 DIAGNOSIS — G894 Chronic pain syndrome: Secondary | ICD-10-CM

## 2014-04-21 DIAGNOSIS — M1712 Unilateral primary osteoarthritis, left knee: Secondary | ICD-10-CM

## 2014-04-21 DIAGNOSIS — G43119 Migraine with aura, intractable, without status migrainosus: Secondary | ICD-10-CM

## 2014-04-21 DIAGNOSIS — Z79899 Other long term (current) drug therapy: Secondary | ICD-10-CM

## 2014-04-21 DIAGNOSIS — Z5181 Encounter for therapeutic drug level monitoring: Secondary | ICD-10-CM

## 2014-04-21 DIAGNOSIS — Z760 Encounter for issue of repeat prescription: Secondary | ICD-10-CM | POA: Insufficient documentation

## 2014-04-21 DIAGNOSIS — M1711 Unilateral primary osteoarthritis, right knee: Secondary | ICD-10-CM

## 2014-04-21 DIAGNOSIS — F329 Major depressive disorder, single episode, unspecified: Secondary | ICD-10-CM

## 2014-04-21 DIAGNOSIS — G90522 Complex regional pain syndrome I of left lower limb: Secondary | ICD-10-CM

## 2014-04-21 DIAGNOSIS — G9050 Complex regional pain syndrome I, unspecified: Secondary | ICD-10-CM | POA: Diagnosis not present

## 2014-04-21 DIAGNOSIS — F32A Depression, unspecified: Secondary | ICD-10-CM

## 2014-04-21 DIAGNOSIS — M47816 Spondylosis without myelopathy or radiculopathy, lumbar region: Secondary | ICD-10-CM

## 2014-04-21 MED ORDER — MORPHINE SULFATE ER 100 MG PO TBCR: 100.0000 mg | EXTENDED_RELEASE_TABLET | Freq: Two times a day (BID) | ORAL | Status: DC

## 2014-04-21 MED ORDER — OXYCODONE HCL 15 MG PO TABS: 15.0000 mg | ORAL_TABLET | Freq: Four times a day (QID) | ORAL | Status: DC | PRN

## 2014-04-21 NOTE — Progress Notes (Signed)
Subjective:    Patient ID: [name], female    DOB: June 06, 1960, 54 y.o.   MRN: [id number]  HPI:[name] is a 54 year old female who returns for follow up for chronic pain and medication refill. She says her pain is located in her bilateral shoulder's, lower back,and bilateral knees left greater than right. She rates her pain 6. Her current exercise regime is performing chair exercises, using bands and walking three times a week. [name] scored a 20 on her PHQ-9 form, she admits to feeling depressed. She denies sucidal thoughts or plan at this time. Pamphlet for [place] given she says it's too far. A referral for behavior clinic was placed. At this time she says she denies counseling. She says maybe if their is a Social worker in her area she will reconsider.  Will ask Corene Cornea to see if [facility] has a satellite unit in her area, she is aware Corene Cornea will give her a call and verbalizes understanding.  Pain Inventory Average Pain 7 Pain Right Now 6 My pain is constant, sharp, burning, dull, stabbing, tingling and aching  In the last 24 hours, has pain interfered with the following? General activity 10 Relation with others 10 Enjoyment of life 10 What TIME of day is your pain at its worst? all Sleep (in general) Poor  Pain is worse with: walking, bending, standing and some activites Pain improves with: rest, heat/ice, medication, TENS and injections Relief from Meds: 7  Mobility use a cane how many minutes can you walk? 5-7 ability to climb steps?  yes do you drive?  yes  Function disabled: date disabled 07/07 I need assistance with the following:  bathing, meal prep, household duties and shopping  Neuro/Psych bladder control problems weakness tremor tingling trouble walking spasms confusion depression anxiety  Prior Studies Any changes since last visit?  no  Physicians involved in your care Any changes since last visit?   no   Family History  Problem Relation Age of Onset  . Depression Mother   . Hypertension Mother   . Hypertension Father   . Heart failure Father   . Diabetes Father   . Heart attack Father   . Colon cancer Neg Hx   . Esophageal cancer Neg Hx   . Stomach cancer Neg Hx   . Rectal cancer Neg Hx    History   Social History  . Marital Status: Married    Spouse Name: [name]  . Number of Children: 1  . Years of Education: some colle   Occupational History  . On disability    Social History Main Topics  . Smoking status: Former Smoker -- 0.50 packs/day for 39 years    Types: Cigarettes    Quit date: 02/03/2012  . Smokeless tobacco: Never Used     Comment: going to try e-sticks to quit  . Alcohol Use: No  . Drug Use: No  . Sexual Activity:    Partners: Male   Other Topics Concern  . None   Social History Narrative   8-10 cups of soda a day.  On disability. No regular exercise.     Past Surgical History  Procedure Laterality Date  . Tonsillectomy  09/1972  . Ulnar nerve entrapment  Feb 1995    left elbow  . Carpal tunnel release  07/1993    both hands  . Tubal ligation  Sept 1999  . Hemorroidectomy  July 2003  . Knee arthroplasty  09/2001, 05/2003  left knee, chondromalasia  . Abdominal hysterectomy  May 2004  . Replacement total knee  Nov 2005    Left   . Thyroid lobectomy  01/2005    malignant area removed from right lobe  . Basel cell carcinoma  06/2005, 07/2005    nasal tip and reconstruction  . Knee arthroscopy  jan 2005    Right knee, tisse release, chrondromalacia  . Colonoscopy    . Thyroid surgery      thyroid lobe removal   Past Medical History  Diagnosis Date  . Chronic pain syndrome   . Intervertebral lumbar disc disorder with myelopathy, lumbar region   . Facet syndrome, lumbar   . Primary localized osteoarthrosis, lower leg   . Degeneration of lumbar or lumbosacral intervertebral disc   . Unspecified musculoskeletal disorders and symptoms  referable to neck     cervical/trapezius  . Migraine without aura, with intractable migraine, so stated, without mention of status migrainosus   . Depression   . Lumbosacral spondylosis without myelopathy   . Allergy   . Anxiety   . Cancer     cancerous nodules on thyroid  . Cancer     basal cell Cancer-nose  . COPD (chronic obstructive pulmonary disease)   . GERD (gastroesophageal reflux disease)   . Hyperlipidemia   . Thyroid disease     hypothyroid   BP 123/73 mmHg  Pulse 85  Resp 16  SpO2 94%  Opioid Risk Score:   Fall Risk Score: Moderate Fall Risk (6-13 points) (previously educated and given handout)  Review of Systems  Constitutional: Positive for diaphoresis and unexpected weight change.  Cardiovascular: Positive for leg swelling.  Gastrointestinal: Positive for nausea, vomiting and constipation.  Musculoskeletal: Positive for gait problem.       Spasms  Neurological: Positive for tremors, weakness and numbness.       Tingling  Psychiatric/Behavioral: Positive for confusion and dysphoric mood. The patient is nervous/anxious.        Panic attacks  All other systems reviewed and are negative.      Objective:   Physical Exam  Constitutional: She is oriented to person, place, and time. She appears well-developed and well-nourished.  HENT:  Head: Normocephalic and atraumatic.  Neck: Normal range of motion. Neck supple.  Cardiovascular: Normal rate and regular rhythm.   Pulmonary/Chest: Effort normal and breath sounds normal.  Musculoskeletal:  Normal Muscle Bulk and Muscle Testing Reveals: Upper Extremities: Full ROM and Muscle Strength 5/5 Bilateral Spine of Scapula Tenderness Lumbar Paraspinal Tenderness: L-3- L-5 Lower Extremities: Full ROM and Muscle Strength 5/5 Bilateral Lower Extremities Flexion Produces Pain into Patella Patella Tenderness Noted with Palpation Arises from chair with ease/ using three prong cane Wide Based Antalgic gait     Neurological: She is alert and oriented to person, place, and time.  Skin: Skin is warm and dry.  Psychiatric: She has a normal mood and affect.  Nursing note and vitals reviewed.         Assessment & Plan:  1.Chronic Low Back Pain: Refilled: Oxycodone 15 mg one tablet every 6 hours as needed #120 and MS Contin 100 mg one tablet BID  2. Degenerative Disk Disease:Continue exercise, and heat therapy. Continue Current Medication Regime.  3. Osteoarthritis of Bilateral Knees: On Compound Ointment. Continue exercise and heat therapy.  4. Migraine Headaches: On Maxalt.  5. Smoking Cessation: Cutting Back: Encouraged to Continue   20 minutes of face to face patient care time was spent during this visit.  All questions were encouraged and answered.   F/U in 1 month.

## 2014-05-08 ENCOUNTER — Other Ambulatory Visit: Payer: Self-pay | Admitting: *Deleted

## 2014-05-08 MED ORDER — TIZANIDINE HCL 2 MG PO TABS: 2.0000 mg | ORAL_TABLET | Freq: Three times a day (TID) | ORAL | Status: DC | PRN

## 2014-05-18 ENCOUNTER — Encounter: Payer: Self-pay | Admitting: Registered Nurse

## 2014-05-18 ENCOUNTER — Encounter: Payer: Medicare Other | Attending: Physical Medicine and Rehabilitation | Admitting: Registered Nurse

## 2014-05-18 VITALS — BP 134/66 | HR 88 | Resp 14

## 2014-05-18 DIAGNOSIS — M1712 Unilateral primary osteoarthritis, left knee: Secondary | ICD-10-CM | POA: Diagnosis not present

## 2014-05-18 DIAGNOSIS — G90522 Complex regional pain syndrome I of left lower limb: Secondary | ICD-10-CM | POA: Diagnosis not present

## 2014-05-18 DIAGNOSIS — Z760 Encounter for issue of repeat prescription: Secondary | ICD-10-CM | POA: Insufficient documentation

## 2014-05-18 DIAGNOSIS — G894 Chronic pain syndrome: Secondary | ICD-10-CM

## 2014-05-18 DIAGNOSIS — G9050 Complex regional pain syndrome I, unspecified: Secondary | ICD-10-CM | POA: Diagnosis not present

## 2014-05-18 DIAGNOSIS — Z79899 Other long term (current) drug therapy: Secondary | ICD-10-CM

## 2014-05-18 DIAGNOSIS — M7062 Trochanteric bursitis, left hip: Secondary | ICD-10-CM

## 2014-05-18 DIAGNOSIS — Z5181 Encounter for therapeutic drug level monitoring: Secondary | ICD-10-CM

## 2014-05-18 DIAGNOSIS — M47816 Spondylosis without myelopathy or radiculopathy, lumbar region: Secondary | ICD-10-CM

## 2014-05-18 DIAGNOSIS — Z720 Tobacco use: Secondary | ICD-10-CM

## 2014-05-18 MED ORDER — MORPHINE SULFATE ER 100 MG PO TBCR: 100.0000 mg | EXTENDED_RELEASE_TABLET | Freq: Two times a day (BID) | ORAL | Status: DC

## 2014-05-18 MED ORDER — OXYCODONE HCL 15 MG PO TABS: 15.0000 mg | ORAL_TABLET | Freq: Four times a day (QID) | ORAL | Status: DC | PRN

## 2014-05-18 NOTE — Progress Notes (Signed)
Subjective:    Patient ID: [name], female    DOB: 12-03-1960, 54 y.o.   MRN: [id number]  HPI:[name] is a 54 year old female who returns for follow up for chronic pain and medication refill. She says her pain is located in her left shoulder blade, bilateral hips and bilateral knees left greater than right. She rates her pain 8. Her current exercise regime is performing chair exercises, using bands and walking three times a week.   Pain Inventory Average Pain 7 Pain Right Now 8 My pain is constant, sharp, burning, dull, stabbing, tingling and aching  In the last 24 hours, has pain interfered with the following? General activity 9 Relation with others 9 Enjoyment of life 9 What TIME of day is your pain at its worst? ALL Sleep (in general) Poor  Pain is worse with: walking, bending, standing and some activites Pain improves with: rest, heat/ice, therapy/exercise, medication, TENS and injections Relief from Meds: 6  Mobility use a cane how many minutes can you walk? 5 ability to climb steps?  yes do you drive?  yes  Function disabled: date disabled .  Neuro/Psych bladder control problems weakness numbness tingling trouble walking spasms dizziness confusion depression anxiety  Prior Studies Any changes since last visit?  no  Physicians involved in your care Any changes since last visit?  no   Family History  Problem Relation Age of Onset  . Depression Mother   . Hypertension Mother   . Hypertension Father   . Heart failure Father   . Diabetes Father   . Heart attack Father   . Colon cancer Neg Hx   . Esophageal cancer Neg Hx   . Stomach cancer Neg Hx   . Rectal cancer Neg Hx    History   Social History  . Marital Status: Married    Spouse Name: [name]  . Number of Children: 1  . Years of Education: some colle   Occupational History  . On disability    Social History Main Topics  . Smoking status: Former Smoker --  0.50 packs/day for 39 years    Types: Cigarettes    Quit date: 02/03/2012  . Smokeless tobacco: Never Used     Comment: going to try e-sticks to quit  . Alcohol Use: No  . Drug Use: No  . Sexual Activity:    Partners: Male   Other Topics Concern  . None   Social History Narrative   8-10 cups of soda a day.  On disability. No regular exercise.     Past Surgical History  Procedure Laterality Date  . Tonsillectomy  09/1972  . Ulnar nerve entrapment  Feb 1995    left elbow  . Carpal tunnel release  07/1993    both hands  . Tubal ligation  Sept 1999  . Hemorroidectomy  July 2003  . Knee arthroplasty  09/2001, 05/2003    left knee, chondromalasia  . Abdominal hysterectomy  May 2004  . Replacement total knee  Nov 2005    Left   . Thyroid lobectomy  01/2005    malignant area removed from right lobe  . Basel cell carcinoma  06/2005, 07/2005    nasal tip and reconstruction  . Knee arthroscopy  jan 2005    Right knee, tisse release, chrondromalacia  . Colonoscopy    . Thyroid surgery      thyroid lobe removal   Past Medical History  Diagnosis Date  . Chronic  pain syndrome   . Intervertebral lumbar disc disorder with myelopathy, lumbar region   . Facet syndrome, lumbar   . Primary localized osteoarthrosis, lower leg   . Degeneration of lumbar or lumbosacral intervertebral disc   . Unspecified musculoskeletal disorders and symptoms referable to neck     cervical/trapezius  . Migraine without aura, with intractable migraine, so stated, without mention of status migrainosus   . Depression   . Lumbosacral spondylosis without myelopathy   . Allergy   . Anxiety   . Cancer     cancerous nodules on thyroid  . Cancer     basal cell Cancer-nose  . COPD (chronic obstructive pulmonary disease)   . GERD (gastroesophageal reflux disease)   . Hyperlipidemia   . Thyroid disease     hypothyroid   BP 134/66 mmHg  Pulse 88  Resp 14  SpO2 94%  Opioid Risk Score:   Fall Risk Score:  Moderate Fall Risk (6-13 points)`1  Depression screen PHQ 2/9  Depression screen [facility] 2/9 04/21/2014  Decreased Interest 3  PHQ - 2 Score 3  Altered sleeping 3  Tired, decreased energy 3  Change in appetite 3  Feeling bad or failure about yourself  2  Trouble concentrating 2  Moving slowly or fidgety/restless 1  Suicidal thoughts 1  PHQ-9 Score 18     Review of Systems  Constitutional: Positive for appetite change and unexpected weight change.       Night sweats  HENT: Negative.   Eyes: Negative.   Respiratory: Negative.   Cardiovascular: Negative.   Gastrointestinal: Positive for nausea and vomiting.  Endocrine: Negative.   Genitourinary:       Bladder control problems  Musculoskeletal: Positive for myalgias, back pain and arthralgias.  Skin: Negative.   Allergic/Immunologic: Negative.   Neurological: Positive for dizziness, weakness and numbness.       Tingling, trouble walking, spasms  Hematological: Negative.   Psychiatric/Behavioral: Positive for confusion and dysphoric mood. The patient is nervous/anxious.        Objective:   Physical Exam  Constitutional: She is oriented to person, place, and time. She appears well-developed and well-nourished.  HENT:  Head: Normocephalic and atraumatic.  Neck: Normal range of motion. Neck supple.  Cardiovascular: Normal rate and regular rhythm.   Pulmonary/Chest: Effort normal and breath sounds normal.  Musculoskeletal:  Normal Muscle Bulk and Muscle Testing Reveals: Upper Extremities: Full ROM and Muscle Strength 5/5 Left Spine of Scapula Tenderness Lower Extremities: Decreased ROM and Muscle Strength 4/5 Bilateral Lower Extremities Flexion Produces pain into Patella Arises from Chair with slight difficulty Wide Based Antalgic gait    Neurological: She is alert and oriented to person, place, and time.  Skin: Skin is warm and dry.  Psychiatric: She has a normal mood and affect.  Nursing note and vitals  reviewed.         Assessment & Plan:  1.Chronic Low Back Pain: Refilled: Oxycodone 15 mg one tablet every 6 hours as needed #120 and MS Contin 100 mg one tablet BID  2. Degenerative Disk Disease:Continue exercise, and heat therapy. Continue Current Medication Regime.  3. Osteoarthritis of Bilateral Knees: On Compound Ointment. Continue exercise and heat therapy.  4. Migraine Headaches: On Maxalt.  5. Smoking Cessation: Cutting Back ( 3- 5 cigarettes a day): Encouraged to Continue 6. Left Greater Trochanteric Bursitis: Schedule Cortisone Injection of Left Hip 20 minutes of face to face patient care time was spent during this visit. All questions were encouraged and  answered.   F/U in 1 month.

## 2014-06-18 ENCOUNTER — Encounter: Payer: Medicare Other | Attending: Physical Medicine and Rehabilitation | Admitting: Registered Nurse

## 2014-06-18 ENCOUNTER — Encounter: Payer: Self-pay | Admitting: Registered Nurse

## 2014-06-18 VITALS — BP 134/77 | HR 100 | Resp 14

## 2014-06-18 DIAGNOSIS — G90522 Complex regional pain syndrome I of left lower limb: Secondary | ICD-10-CM

## 2014-06-18 DIAGNOSIS — Z760 Encounter for issue of repeat prescription: Secondary | ICD-10-CM | POA: Insufficient documentation

## 2014-06-18 DIAGNOSIS — G894 Chronic pain syndrome: Secondary | ICD-10-CM | POA: Diagnosis not present

## 2014-06-18 DIAGNOSIS — M47816 Spondylosis without myelopathy or radiculopathy, lumbar region: Secondary | ICD-10-CM

## 2014-06-18 DIAGNOSIS — Z79899 Other long term (current) drug therapy: Secondary | ICD-10-CM | POA: Diagnosis not present

## 2014-06-18 DIAGNOSIS — Z5181 Encounter for therapeutic drug level monitoring: Secondary | ICD-10-CM | POA: Diagnosis not present

## 2014-06-18 DIAGNOSIS — Z720 Tobacco use: Secondary | ICD-10-CM

## 2014-06-18 DIAGNOSIS — M1712 Unilateral primary osteoarthritis, left knee: Secondary | ICD-10-CM

## 2014-06-18 MED ORDER — OXYCODONE HCL 15 MG PO TABS: 15.0000 mg | ORAL_TABLET | Freq: Four times a day (QID) | ORAL | Status: DC | PRN

## 2014-06-18 MED ORDER — MORPHINE SULFATE ER 100 MG PO TBCR: 100.0000 mg | EXTENDED_RELEASE_TABLET | Freq: Two times a day (BID) | ORAL | Status: DC

## 2014-06-18 NOTE — Progress Notes (Signed)
Subjective:    Patient ID: [name], female    DOB: 07-14-1960, 54 y.o.   MRN: [id number]  HPI: [name] is a 54 year old female who returns for follow up for chronic pain and medication refill. She says her pain is located in her bilateral shoulder blade's. Also states she has a migraine that has lasted for five days. She's taking her maxalt and phenergan.She rates her pain 7. Her current exercise regime is performing chair exercises, using bands and walking three times a week.   Pain Inventory Average Pain 8 Pain Right Now 7 My pain is constant, sharp, burning, dull, stabbing, tingling and aching  In the last 24 hours, has pain interfered with the following? General activity 9 Relation with others 9 Enjoyment of life 9 What TIME of day is your pain at its worst? varies Sleep (in general) Poor  Pain is worse with: walking, bending, standing and some activites Pain improves with: rest, heat/ice, therapy/exercise, medication, TENS and injections Relief from Meds: 7  Mobility walk with assistance use a cane how many minutes can you walk? 5-7 ability to climb steps?  yes do you drive?  yes Do you have any goals in this area?  yes  Function disabled: date disabled . I need assistance with the following:  bathing, meal prep, household duties and shopping Do you have any goals in this area?  yes  Neuro/Psych bladder control problems weakness tremor tingling trouble walking spasms dizziness confusion depression anxiety  Prior Studies Any changes since last visit?  no  Physicians involved in your care Any changes since last visit?  no   Family History  Problem Relation Age of Onset  . Depression Mother   . Hypertension Mother   . Hypertension Father   . Heart failure Father   . Diabetes Father   . Heart attack Father   . Colon cancer Neg Hx   . Esophageal cancer Neg Hx   . Stomach cancer Neg Hx   . Rectal cancer Neg Hx     History   Social History  . Marital Status: Married    Spouse Name: [name]  . Number of Children: 1  . Years of Education: some colle   Occupational History  . On disability    Social History Main Topics  . Smoking status: Former Smoker -- 0.50 packs/day for 39 years    Types: Cigarettes    Quit date: 02/03/2012  . Smokeless tobacco: Never Used     Comment: going to try e-sticks to quit  . Alcohol Use: No  . Drug Use: No  . Sexual Activity:    Partners: Male   Other Topics Concern  . None   Social History Narrative   8-10 cups of soda a day.  On disability. No regular exercise.     Past Surgical History  Procedure Laterality Date  . Tonsillectomy  09/1972  . Ulnar nerve entrapment  Feb 1995    left elbow  . Carpal tunnel release  07/1993    both hands  . Tubal ligation  Sept 1999  . Hemorroidectomy  July 2003  . Knee arthroplasty  09/2001, 05/2003    left knee, chondromalasia  . Abdominal hysterectomy  May 2004  . Replacement total knee  Nov 2005    Left   . Thyroid lobectomy  01/2005    malignant area removed from right lobe  . Basel cell carcinoma  06/2005, 07/2005    nasal  tip and reconstruction  . Knee arthroscopy  jan 2005    Right knee, tisse release, chrondromalacia  . Colonoscopy    . Thyroid surgery      thyroid lobe removal   Past Medical History  Diagnosis Date  . Chronic pain syndrome   . Intervertebral lumbar disc disorder with myelopathy, lumbar region   . Facet syndrome, lumbar   . Primary localized osteoarthrosis, lower leg   . Degeneration of lumbar or lumbosacral intervertebral disc   . Unspecified musculoskeletal disorders and symptoms referable to neck     cervical/trapezius  . Migraine without aura, with intractable migraine, so stated, without mention of status migrainosus   . Depression   . Lumbosacral spondylosis without myelopathy   . Allergy   . Anxiety   . Cancer     cancerous nodules on thyroid  . Cancer     basal cell  Cancer-nose  . COPD (chronic obstructive pulmonary disease)   . GERD (gastroesophageal reflux disease)   . Hyperlipidemia   . Thyroid disease     hypothyroid   BP 134/77 mmHg  Pulse 100  Resp 14  SpO2 93%  Opioid Risk Score:   Fall Risk Score: Moderate Fall Risk (6-13 points) (patient declined information)`1  Depression screen PHQ 2/9  Depression screen [facility] 2/9 04/21/2014  Decreased Interest 3  PHQ - 2 Score 3  Altered sleeping 3  Tired, decreased energy 3  Change in appetite 3  Feeling bad or failure about yourself  2  Trouble concentrating 2  Moving slowly or fidgety/restless 1  Suicidal thoughts 1  PHQ-9 Score 18     Review of Systems  Constitutional:       Night sweats Weight gain Poor appetite  Cardiovascular: Positive for leg swelling.  Gastrointestinal: Positive for nausea, vomiting, diarrhea and constipation.  Genitourinary:       Incontinence  Neurological: Positive for dizziness, tremors and weakness.       Tingling Spasms   Psychiatric/Behavioral: Positive for confusion. The patient is nervous/anxious.   All other systems reviewed and are negative.      Objective:   Physical Exam  Constitutional: She is oriented to person, place, and time. She appears well-developed and well-nourished.  HENT:  Head: Normocephalic and atraumatic.  Neck: Normal range of motion. Neck supple.  Cardiovascular: Normal rate and regular rhythm.   Pulmonary/Chest: Effort normal and breath sounds normal.  Musculoskeletal: She exhibits edema.  Normal Muscle Bulk and Muscle Testing Reveals: Upper Extremities: Full ROM and Muscle Strength 5/5 Thoracic Paraspinal Tenderness: T-3- T-7 Lumbar Hypersensitivity Lower Extremities: Full ROM and Muscle Strength 5/5 Lower Extremities with Pitting Edema 2 + Arises from chair slowly/ Using 3 Prong Cane for Support Antalgic Gait   Neurological: She is alert and oriented to person, place, and time.  Skin: Skin is warm and dry.    Psychiatric: She has a normal mood and affect.  Nursing note and vitals reviewed.         Assessment & Plan:  1.Chronic Low Back Pain: Refilled: Oxycodone 15 mg one tablet every 6 hours as needed #120 and MS Contin 100 mg one tablet every 12 hours #60. 2. Degenerative Disk Disease:Continue exercise, and heat therapy. Continue Current Medication Regime.  3. Osteoarthritis of Bilateral Knees: On Compound Ointment. Continue exercise and heat therapy.  4. Migraine Headaches: On Maxalt.  5. Smoking Cessation: Cutting Back ( 5 cigarettes a day): Encouraged to Continue with smoking cessation  20 minutes of face to face  patient care time was spent during this visit. All questions were encouraged and answered.   F/U in 1 month.

## 2014-06-19 ENCOUNTER — Other Ambulatory Visit: Payer: Self-pay | Admitting: Registered Nurse

## 2014-06-19 DIAGNOSIS — G894 Chronic pain syndrome: Secondary | ICD-10-CM | POA: Diagnosis not present

## 2014-06-19 DIAGNOSIS — Z79899 Other long term (current) drug therapy: Secondary | ICD-10-CM | POA: Diagnosis not present

## 2014-06-19 DIAGNOSIS — Z5181 Encounter for therapeutic drug level monitoring: Secondary | ICD-10-CM | POA: Diagnosis not present

## 2014-06-20 LAB — PMP ALCOHOL METABOLITE (ETG): Ethyl Glucuronide (EtG): NEGATIVE ng/mL

## 2014-06-23 LAB — OXYCODONE, URINE (LC/MS-MS)
Noroxycodone, Ur: 9775 ng/mL (ref <50)
OXYMORPHONE, URINE: 2057 ng/mL (ref <50)
Oxycodone, ur: 12841 ng/mL (ref <50)

## 2014-06-23 LAB — OPIATES/OPIOIDS (LC/MS-MS)
Codeine Urine: NEGATIVE ng/mL (ref <50)
HYDROMORPHONE: 388 ng/mL (ref <50)
Hydrocodone: NEGATIVE ng/mL (ref <50)
Morphine Urine: >75000 ng/mL (ref <50)
NORHYDROCODONE, UR: NEGATIVE ng/mL (ref <50)
NOROXYCODONE, UR: 9775 ng/mL (ref <50)
OXYCODONE, UR: 12841 ng/mL (ref <50)
OXYMORPHONE, URINE: 2057 ng/mL (ref <50)

## 2014-06-24 LAB — PRESCRIPTION MONITORING PROFILE (SOLSTAS)
Amphetamine/Meth: NEGATIVE ng/mL
Barbiturate Screen, Urine: NEGATIVE ng/mL
Benzodiazepine Screen, Urine: NEGATIVE ng/mL
Buprenorphine, Urine: NEGATIVE ng/mL
CARISOPRODOL, URINE: NEGATIVE ng/mL
COCAINE METABOLITES: NEGATIVE ng/mL
CREATININE, URINE: 130.72 mg/dL (ref >20.0)
Cannabinoid Scrn, Ur: NEGATIVE ng/mL
ECSTASY: NEGATIVE ng/mL
Fentanyl, Ur: NEGATIVE ng/mL
METHADONE SCREEN, URINE: NEGATIVE ng/mL
Meperidine, Ur: NEGATIVE ng/mL
Nitrites, Initial: NEGATIVE ug/mL
PH URINE, INITIAL: 5.6 pH (ref 4.5–8.9)
Propoxyphene: NEGATIVE ng/mL
TRAMADOL UR: NEGATIVE ng/mL
Tapentadol, urine: NEGATIVE ng/mL
Zolpidem, Urine: NEGATIVE ng/mL

## 2014-06-25 NOTE — Progress Notes (Signed)
Urine drug screen for this encounter is consistent for prescribed medication 

## 2014-07-20 ENCOUNTER — Encounter: Payer: Self-pay | Admitting: Physical Medicine & Rehabilitation

## 2014-07-20 ENCOUNTER — Encounter: Payer: Medicare Other | Attending: Physical Medicine and Rehabilitation | Admitting: Physical Medicine & Rehabilitation

## 2014-07-20 VITALS — BP 140/74 | HR 104 | Resp 14

## 2014-07-20 DIAGNOSIS — Z760 Encounter for issue of repeat prescription: Secondary | ICD-10-CM | POA: Diagnosis not present

## 2014-07-20 DIAGNOSIS — M47816 Spondylosis without myelopathy or radiculopathy, lumbar region: Secondary | ICD-10-CM

## 2014-07-20 DIAGNOSIS — M7918 Myalgia, other site: Secondary | ICD-10-CM

## 2014-07-20 DIAGNOSIS — M1712 Unilateral primary osteoarthritis, left knee: Secondary | ICD-10-CM | POA: Diagnosis not present

## 2014-07-20 DIAGNOSIS — G90522 Complex regional pain syndrome I of left lower limb: Secondary | ICD-10-CM | POA: Diagnosis not present

## 2014-07-20 DIAGNOSIS — M797 Fibromyalgia: Secondary | ICD-10-CM

## 2014-07-20 DIAGNOSIS — M51369 Other intervertebral disc degeneration, lumbar region without mention of lumbar back pain or lower extremity pain: Secondary | ICD-10-CM

## 2014-07-20 DIAGNOSIS — M5136 Other intervertebral disc degeneration, lumbar region: Secondary | ICD-10-CM

## 2014-07-20 DIAGNOSIS — M1711 Unilateral primary osteoarthritis, right knee: Secondary | ICD-10-CM

## 2014-07-20 MED ORDER — MORPHINE SULFATE ER 100 MG PO TBCR: 100.0000 mg | EXTENDED_RELEASE_TABLET | Freq: Two times a day (BID) | ORAL | Status: DC

## 2014-07-20 MED ORDER — OXYCODONE HCL 15 MG PO TABS: 15.0000 mg | ORAL_TABLET | Freq: Four times a day (QID) | ORAL | Status: DC | PRN

## 2014-07-20 NOTE — Progress Notes (Signed)
Subjective:    Patient ID: [name], female    DOB: [birth year], 54 y.o.   MRN: [id number]  HPI   [name] is here in follow up of her chronic pain. She has been doing better with her headaches until Saturday when they returned. She typically, has two -three per month which last four days. She had a mild effect with the botox injections after we last did those.   Her knee pain is generally stable.  Her left shoulder blade has been tighter over the last few weeks. She has had good results with trigger points in the past.   [name]'s sleep is still poor. She doesn't feel that she's falling into deep sleep.     Pain Inventory Average Pain 7 Pain Right Now 8 My pain is constant, burning, dull, stabbing, tingling and aching  In the last 24 hours, has pain interfered with the following? General activity 10 Relation with others 10 Enjoyment of life 10 What TIME of day is your pain at its worst? ALL Sleep (in general) Poor  Pain is worse with: walking, bending, standing and some activites Pain improves with: rest, heat/ice, therapy/exercise, pacing activities, medication, TENS and injections Relief from Meds: 6  Mobility walk without assistance use a walker how many minutes can you walk? 5-10 ability to climb steps?  yes do you drive?  yes  Function disabled: date disabled 2007  Neuro/Psych weakness numbness tremor tingling trouble walking spasms dizziness confusion depression anxiety  Prior Studies Any changes since last visit?  no  Physicians involved in your care Any changes since last visit?  no   Family History  Problem Relation Age of Onset  . Depression Mother   . Hypertension Mother   . Hypertension Father   . Heart failure Father   . Diabetes Father   . Heart attack Father   . Colon cancer Neg Hx   . Esophageal cancer Neg Hx   . Stomach cancer Neg Hx   . Rectal cancer Neg Hx    History   Social History  . Marital Status:  Married    Spouse Name: [name]  . Number of Children: 1  . Years of Education: some colle   Occupational History  . On disability    Social History Main Topics  . Smoking status: Former Smoker -- 0.50 packs/day for .5 years    Types: Cigarettes    Quit date: 02/03/2012  . Smokeless tobacco: Never Used     Comment: going to try e-sticks to quit  . Alcohol Use: No  . Drug Use: No  . Sexual Activity:    Partners: Male   Other Topics Concern  . None   Social History Narrative   8-10 cups of soda a day.  On disability. No regular exercise.     Past Surgical History  Procedure Laterality Date  . Tonsillectomy  09/1972  . Ulnar nerve entrapment  Feb 1995    left elbow  . Carpal tunnel release  07/1993    both hands  . Tubal ligation  Sept 1999  . Hemorroidectomy  July 2003  . Knee arthroplasty  09/2001, 05/2003    left knee, chondromalasia  . Abdominal hysterectomy  May 2004  . Replacement total knee  Nov 2005    Left   . Thyroid lobectomy  01/2005    malignant area removed from right lobe  . Basel cell carcinoma  06/2005, 07/2005    nasal tip and reconstruction  .  Knee arthroscopy  jan 2005    Right knee, tisse release, chrondromalacia  . Colonoscopy    . Thyroid surgery      thyroid lobe removal   Past Medical History  Diagnosis Date  . Chronic pain syndrome   . Intervertebral lumbar disc disorder with myelopathy, lumbar region   . Facet syndrome, lumbar   . Primary localized osteoarthrosis, lower leg   . Degeneration of lumbar or lumbosacral intervertebral disc   . Unspecified musculoskeletal disorders and symptoms referable to neck     cervical/trapezius  . Migraine without aura, with intractable migraine, so stated, without mention of status migrainosus   . Depression   . Lumbosacral spondylosis without myelopathy   . Allergy   . Anxiety   . Cancer     cancerous nodules on thyroid  . Cancer     basal cell Cancer-nose  . COPD (chronic obstructive pulmonary  disease)   . GERD (gastroesophageal reflux disease)   . Hyperlipidemia   . Thyroid disease     hypothyroid   BP 140/74 mmHg  Pulse 104  Resp 14  SpO2 96%  Opioid Risk Score:   Fall Risk Score: Moderate Fall Risk (6-13 points)`1  Depression screen PHQ 2/9  Depression screen [facility] 2/9 04/21/2014  Decreased Interest 3  PHQ - 2 Score 3  Altered sleeping 3  Tired, decreased energy 3  Change in appetite 3  Feeling bad or failure about yourself  2  Trouble concentrating 2  Moving slowly or fidgety/restless 1  Suicidal thoughts 1  PHQ-9 Score 18     Review of Systems  Constitutional:       Night sweats, weight loss/gain  HENT: Negative.   Eyes: Negative.   Respiratory: Negative.   Cardiovascular: Negative.   Gastrointestinal: Positive for nausea, vomiting and constipation.  Endocrine: Negative.   Genitourinary: Negative.   Musculoskeletal: Positive for myalgias, back pain, arthralgias and neck pain.  Skin: Negative.   Allergic/Immunologic: Negative.   Neurological: Positive for dizziness, tremors, weakness and numbness.       Tingling, trouble walking, spasms  Hematological: Negative.   Psychiatric/Behavioral: Positive for confusion and dysphoric mood. The patient is nervous/anxious.        Objective:   Physical Exam  Constitutional: She is oriented to person, place, and time. She appears well-developed.  HENT:  Head: Normocephalic.  Eyes: Pupils are equal, round, and reactive to light.  Neck: Normal range of motion.  Neurological: She is alert and oriented to person, place, and time.  Skin: Skin is warm and dry.  Psychiatric: She has a normal mood and affect.  Symmetric normal motor tone is noted throughout. Normal muscle bulk. Muscle testing reveals 5/5 muscle strength of the upper extremity, and 5/5 of the lower extremity, except left quadriceps 4/5. Full range of motion in upper and lower extremities, except left knee restricted in extension largely due to pain.  Left knee more sensitive to touch than the right---not allodynic  DTR in the upper and lower extremity are present and symmetric 1+. No clonus is noted. Uses cane for balance Back: left lower rhomboids taut,tender to touch Psych: mood is pleasant and up beat   Assessment & Plan:   1. Chronic low back pain.  2. Facet arthropathy.  3. Degenerative disk disease.  4. Osteoarthritis of the knees.  5. Migraine headaches with aura 6. Myofascial pain. Left rhomboid  7. RSD? left lower extremity and right near knees---TP Bone Scan does not support this dx however.  PLAN:  1. Refilled MS Contin 100 mg 1 p.o. q.12 h., 60 with no refill.  She remains stable with these meds. Encouraged ongoing activity as tolerated.  2. Oxycodone 15 mg 1 p.o. q.6 h. p.r.n., 120 with no refill.   3. Continue with gabapentin 800mg  qid for her centralized pain. I won't pursue further botox injections 4. After informed consent and preparation of the skin with isopropyl alcohol, I injected the left lower rhomboid with 2cc of 1% lidocaine. The patient tolerated well, and no complications were experienced. Post-injection instructions were provided.  5. Continue custom compounded cream, ketamine 4%, ketoprofen 20%, baclofen 5%, elavil 5% for neuropathic knee pain.  6. Consider sleep study 7. She will follow up in a about a Month with our NP.

## 2014-07-20 NOTE — Patient Instructions (Signed)
PLEASE CALL ME WITH ANY PROBLEMS OR QUESTIONS (#[phone]).  HAVE A GOOD DAY!    

## 2014-08-01 ENCOUNTER — Other Ambulatory Visit: Payer: Self-pay | Admitting: Registered Nurse

## 2014-08-04 ENCOUNTER — Other Ambulatory Visit: Payer: Self-pay | Admitting: *Deleted

## 2014-08-04 DIAGNOSIS — M7918 Myalgia, other site: Secondary | ICD-10-CM

## 2014-08-04 DIAGNOSIS — G9050 Complex regional pain syndrome I, unspecified: Secondary | ICD-10-CM

## 2014-08-04 DIAGNOSIS — M47816 Spondylosis without myelopathy or radiculopathy, lumbar region: Secondary | ICD-10-CM

## 2014-08-04 MED ORDER — NORTRIPTYLINE HCL 50 MG PO CAPS: 100.0000 mg | ORAL_CAPSULE | Freq: Every day | ORAL | Status: DC

## 2014-08-04 NOTE — Telephone Encounter (Signed)
Recd fax refill request for Nortriptyline hcl 50 mg caps #60  #RF 2 sent in electronically

## 2014-08-14 ENCOUNTER — Other Ambulatory Visit: Payer: Self-pay | Admitting: Family Medicine

## 2014-08-18 ENCOUNTER — Ambulatory Visit: Payer: Medicare Other | Admitting: Family Medicine

## 2014-08-19 ENCOUNTER — Encounter: Payer: Self-pay | Admitting: Registered Nurse

## 2014-08-19 ENCOUNTER — Encounter: Payer: Medicare Other | Attending: Physical Medicine and Rehabilitation | Admitting: Registered Nurse

## 2014-08-19 VITALS — BP 114/70 | HR 98 | Resp 14

## 2014-08-19 DIAGNOSIS — Z760 Encounter for issue of repeat prescription: Secondary | ICD-10-CM | POA: Diagnosis not present

## 2014-08-19 DIAGNOSIS — M5136 Other intervertebral disc degeneration, lumbar region: Secondary | ICD-10-CM

## 2014-08-19 DIAGNOSIS — Z5181 Encounter for therapeutic drug level monitoring: Secondary | ICD-10-CM

## 2014-08-19 DIAGNOSIS — M7918 Myalgia, other site: Secondary | ICD-10-CM

## 2014-08-19 DIAGNOSIS — M1712 Unilateral primary osteoarthritis, left knee: Secondary | ICD-10-CM | POA: Diagnosis not present

## 2014-08-19 DIAGNOSIS — M1711 Unilateral primary osteoarthritis, right knee: Secondary | ICD-10-CM

## 2014-08-19 DIAGNOSIS — M797 Fibromyalgia: Secondary | ICD-10-CM | POA: Diagnosis not present

## 2014-08-19 DIAGNOSIS — G894 Chronic pain syndrome: Secondary | ICD-10-CM

## 2014-08-19 DIAGNOSIS — Z79899 Other long term (current) drug therapy: Secondary | ICD-10-CM

## 2014-08-19 MED ORDER — MORPHINE SULFATE ER 100 MG PO TBCR: 100.0000 mg | EXTENDED_RELEASE_TABLET | Freq: Two times a day (BID) | ORAL | Status: DC

## 2014-08-19 MED ORDER — OXYCODONE HCL 15 MG PO TABS: 15.0000 mg | ORAL_TABLET | Freq: Four times a day (QID) | ORAL | Status: DC | PRN

## 2014-08-19 NOTE — Progress Notes (Signed)
Subjective:    Patient ID: [name], female    DOB: 08/06/1960, 54 y.o.   MRN: [id number]  HPI: [name] is a 54 year old female who returns for follow up for chronic pain and medication refill. She says her pain is located in her lower back mainly left side, bilateral hips and bilateral knees.She rates her pain 7. Her current exercise regime is performing chair exercises, using bands and walking three times a week.   Pain Inventory Average Pain 8 Pain Right Now 7 My pain is constant, sharp, burning, dull, stabbing, tingling and aching  In the last 24 hours, has pain interfered with the following? General activity 10 Relation with others 10 Enjoyment of life 10 What TIME of day is your pain at its worst? all Sleep (in general) Poor  Pain is worse with: walking, bending, standing and some activites Pain improves with: rest, heat/ice, therapy/exercise, medication, TENS and injections Relief from Meds: 8  Mobility walk with assistance use a cane ability to climb steps?  yes do you drive?  yes Do you have any goals in this area?  yes  Function not employed: date last employed . disabled: date disabled . I need assistance with the following:  bathing, meal prep, household duties and shopping Do you have any goals in this area?  yes  Neuro/Psych bladder control problems weakness numbness tremor tingling trouble walking spasms dizziness confusion depression anxiety  Prior Studies Any changes since last visit?  no  Physicians involved in your care Any changes since last visit?  no   Family History  Problem Relation Age of Onset  . Depression Mother   . Hypertension Mother   . Hypertension Father   . Heart failure Father   . Diabetes Father   . Heart attack Father   . Colon cancer Neg Hx   . Esophageal cancer Neg Hx   . Stomach cancer Neg Hx   . Rectal cancer Neg Hx    History   Social History  . Marital Status: Married   Spouse Name: [name]  . Number of Children: 1  . Years of Education: some colle   Occupational History  . On disability    Social History Main Topics  . Smoking status: Former Smoker -- 0.50 packs/day for .5 years    Types: Cigarettes    Quit date: 02/03/2012  . Smokeless tobacco: Never Used     Comment: going to try e-sticks to quit  . Alcohol Use: No  . Drug Use: No  . Sexual Activity:    Partners: Male   Other Topics Concern  . None   Social History Narrative   8-10 cups of soda a day.  On disability. No regular exercise.     Past Surgical History  Procedure Laterality Date  . Tonsillectomy  09/1972  . Ulnar nerve entrapment  Feb 1995    left elbow  . Carpal tunnel release  07/1993    both hands  . Tubal ligation  Sept 1999  . Hemorroidectomy  July 2003  . Knee arthroplasty  09/2001, 05/2003    left knee, chondromalasia  . Abdominal hysterectomy  May 2004  . Replacement total knee  Nov 2005    Left   . Thyroid lobectomy  01/2005    malignant area removed from right lobe  . Basel cell carcinoma  06/2005, 07/2005    nasal tip and reconstruction  . Knee arthroscopy  jan 2005  Right knee, tisse release, chrondromalacia  . Colonoscopy    . Thyroid surgery      thyroid lobe removal   Past Medical History  Diagnosis Date  . Chronic pain syndrome   . Intervertebral lumbar disc disorder with myelopathy, lumbar region   . Facet syndrome, lumbar   . Primary localized osteoarthrosis, lower leg   . Degeneration of lumbar or lumbosacral intervertebral disc   . Unspecified musculoskeletal disorders and symptoms referable to neck     cervical/trapezius  . Migraine without aura, with intractable migraine, so stated, without mention of status migrainosus   . Depression   . Lumbosacral spondylosis without myelopathy   . Allergy   . Anxiety   . Cancer     cancerous nodules on thyroid  . Cancer     basal cell Cancer-nose  . COPD (chronic obstructive pulmonary disease)   .  GERD (gastroesophageal reflux disease)   . Hyperlipidemia   . Thyroid disease     hypothyroid   BP 114/70 mmHg  Pulse 98  Resp 14  SpO2 94%  Opioid Risk Score:   Fall Risk Score: Moderate Fall Risk (6-13 points) (pt declined educational material)`1  Depression screen PHQ 2/9  Depression screen [facility] 2/9 04/21/2014  Decreased Interest 3  PHQ - 2 Score 3  Altered sleeping 3  Tired, decreased energy 3  Change in appetite 3  Feeling bad or failure about yourself  2  Trouble concentrating 2  Moving slowly or fidgety/restless 1  Suicidal thoughts 1  PHQ-9 Score 18     Review of Systems  Constitutional: Positive for appetite change and unexpected weight change.       Night sweats Weight gain Poor appetite  Cardiovascular: Positive for leg swelling.  Gastrointestinal: Positive for nausea, vomiting and constipation.  Genitourinary: Positive for urgency.       Incontinence  Musculoskeletal: Positive for gait problem.  Neurological: Positive for dizziness, tremors, weakness and numbness.       Tingling Spasms   Psychiatric/Behavioral: Positive for confusion and dysphoric mood. The patient is nervous/anxious.   All other systems reviewed and are negative.      Objective:   Physical Exam  Constitutional: She is oriented to person, place, and time. She appears well-developed and well-nourished.  HENT:  Head: Normocephalic and atraumatic.  Neck: Normal range of motion. Neck supple.  Cardiovascular: Normal rate and regular rhythm.   Pulmonary/Chest: Effort normal and breath sounds normal.  Musculoskeletal:  Normal Muscle Bulk and Muscle Testing Reveals: Upper Extremities: Full ROM and Muscle Strength 5/5 Thoracic Paraspinal Tenderness: Mainly Right Side T-4-T-7 Lumbar Paraspinal Tenderness: L-3- L-5 Lower Extremities: Full ROM and Muscle Strength 5/5 Arises from chair slowly/ Using three prong cane Wide Based Gait    Neurological: She is alert and oriented to person,  place, and time.  Skin: Skin is warm and dry.  Psychiatric: She has a normal mood and affect.  Nursing note and vitals reviewed.         Assessment & Plan:  1.Chronic Low Back Pain: Refilled: Oxycodone 15 mg one tablet every 6 hours as needed #120 and MS Contin 100 mg one tablet every 12 hours #60. 2. Degenerative Disk Disease:Continue exercise, and heat therapy. Continue Current Medication Regime.  3. Osteoarthritis of Bilateral Knees: On Compound Ointment. Continue exercise and heat therapy.  4. Migraine Headaches: On Maxalt.  5. Smoking Cessation: Cutting Back ( 5 cigarettes a day): Encouraged to Continue with smoking cessation  20 minutes of face  to face patient care time was spent during this visit. All questions were encouraged and answered.   F/U in 1 month.

## 2014-09-02 ENCOUNTER — Ambulatory Visit: Payer: Medicare Other | Admitting: Family Medicine

## 2014-09-14 ENCOUNTER — Other Ambulatory Visit: Payer: Self-pay | Admitting: Family Medicine

## 2014-09-15 NOTE — Telephone Encounter (Signed)
Pt needs lab work completed

## 2014-09-17 ENCOUNTER — Encounter: Payer: Self-pay | Admitting: Registered Nurse

## 2014-09-17 ENCOUNTER — Encounter: Payer: Medicare Other | Attending: Physical Medicine and Rehabilitation | Admitting: Registered Nurse

## 2014-09-17 VITALS — BP 130/69 | HR 93

## 2014-09-17 DIAGNOSIS — G894 Chronic pain syndrome: Secondary | ICD-10-CM

## 2014-09-17 DIAGNOSIS — M1711 Unilateral primary osteoarthritis, right knee: Secondary | ICD-10-CM

## 2014-09-17 DIAGNOSIS — M5136 Other intervertebral disc degeneration, lumbar region: Secondary | ICD-10-CM | POA: Diagnosis not present

## 2014-09-17 DIAGNOSIS — M7918 Myalgia, other site: Secondary | ICD-10-CM

## 2014-09-17 DIAGNOSIS — Z720 Tobacco use: Secondary | ICD-10-CM

## 2014-09-17 DIAGNOSIS — M797 Fibromyalgia: Secondary | ICD-10-CM

## 2014-09-17 DIAGNOSIS — M1712 Unilateral primary osteoarthritis, left knee: Secondary | ICD-10-CM | POA: Diagnosis not present

## 2014-09-17 DIAGNOSIS — Z760 Encounter for issue of repeat prescription: Secondary | ICD-10-CM | POA: Insufficient documentation

## 2014-09-17 DIAGNOSIS — Z5181 Encounter for therapeutic drug level monitoring: Secondary | ICD-10-CM

## 2014-09-17 DIAGNOSIS — Z79899 Other long term (current) drug therapy: Secondary | ICD-10-CM

## 2014-09-17 MED ORDER — MORPHINE SULFATE ER 100 MG PO TBCR: 100.0000 mg | EXTENDED_RELEASE_TABLET | Freq: Two times a day (BID) | ORAL | Status: DC

## 2014-09-17 MED ORDER — OXYCODONE HCL 15 MG PO TABS: 15.0000 mg | ORAL_TABLET | Freq: Four times a day (QID) | ORAL | Status: DC | PRN

## 2014-09-17 NOTE — Progress Notes (Signed)
Subjective:    Patient ID: [name], female    DOB: 10-Jul-1960, 54 y.o.   MRN: [id number]  HPI: [name] is a 54 year old female who returns for follow up for chronic pain and medication refill. She says her pain is located in her lower back mainly left side and bilateral knees left greater than right. She rates her pain 8. Her current exercise regime is performing chair exercises, using bands and walking three times a week.  Her lower extremities edematous, she has a PCP appointment on 10/06/14. Encouraged to keep lower extremities elevated and to follow up with her PCP she verbalizes understanding.  Pain Inventory Average Pain 8 Pain Right Now 8 My pain is constant, sharp, burning, dull, stabbing, tingling and aching  In the last 24 hours, has pain interfered with the following? General activity 10 Relation with others 10 Enjoyment of life 10 What TIME of day is your pain at its worst? all Sleep (in general) Poor  Pain is worse with: walking, bending, standing and some activites Pain improves with: rest, heat/ice, therapy/exercise, pacing activities, medication, TENS and injections Relief from Meds: 7  Mobility use a cane how many minutes can you walk? 5 ability to climb steps?  yes do you drive?  yes  Function disabled: date disabled 08/2005 I need assistance with the following:  bathing, meal prep, household duties and shopping  Neuro/Psych weakness numbness tremor tingling trouble walking spasms dizziness confusion depression anxiety  Panic attacks are worse. She reports nothing has changed about her original phq9 (18)  Prior Studies Any changes since last visit?  no  Physicians involved in your care Any changes since last visit?  no   Family History  Problem Relation Age of Onset  . Depression Mother   . Hypertension Mother   . Hypertension Father   . Heart failure Father   . Diabetes Father   . Heart attack Father   .  Colon cancer Neg Hx   . Esophageal cancer Neg Hx   . Stomach cancer Neg Hx   . Rectal cancer Neg Hx    Social History   Social History  . Marital Status: Married    Spouse Name: [name]  . Number of Children: 1  . Years of Education: some colle   Occupational History  . On disability    Social History Main Topics  . Smoking status: Former Smoker -- 0.50 packs/day for .5 years    Types: Cigarettes    Quit date: 02/03/2012  . Smokeless tobacco: Never Used     Comment: going to try e-sticks to quit  . Alcohol Use: No  . Drug Use: No  . Sexual Activity:    Partners: Male   Other Topics Concern  . None   Social History Narrative   8-10 cups of soda a day.  On disability. No regular exercise.     Past Surgical History  Procedure Laterality Date  . Tonsillectomy  09/1972  . Ulnar nerve entrapment  Feb 1995    left elbow  . Carpal tunnel release  07/1993    both hands  . Tubal ligation  Sept 1999  . Hemorroidectomy  July 2003  . Knee arthroplasty  09/2001, 05/2003    left knee, chondromalasia  . Abdominal hysterectomy  May 2004  . Replacement total knee  Nov 2005    Left   . Thyroid lobectomy  01/2005    malignant area removed from right lobe  .  Basel cell carcinoma  06/2005, 07/2005    nasal tip and reconstruction  . Knee arthroscopy  jan 2005    Right knee, tisse release, chrondromalacia  . Colonoscopy    . Thyroid surgery      thyroid lobe removal   Past Medical History  Diagnosis Date  . Chronic pain syndrome   . Intervertebral lumbar disc disorder with myelopathy, lumbar region   . Facet syndrome, lumbar   . Primary localized osteoarthrosis, lower leg   . Degeneration of lumbar or lumbosacral intervertebral disc   . Unspecified musculoskeletal disorders and symptoms referable to neck     cervical/trapezius  . Migraine without aura, with intractable migraine, so stated, without mention of status migrainosus   . Depression   . Lumbosacral spondylosis without  myelopathy   . Allergy   . Anxiety   . Cancer     cancerous nodules on thyroid  . Cancer     basal cell Cancer-nose  . COPD (chronic obstructive pulmonary disease)   . GERD (gastroesophageal reflux disease)   . Hyperlipidemia   . Thyroid disease     hypothyroid   BP 135/82 mmHg  Pulse 93  SpO2 96%  Opioid Risk Score:   Fall Risk Score:  `1  Depression screen PHQ 2/9  Depression screen [facility] 2/9 09/17/2014 04/21/2014  Decreased Interest 3 3  Down, Depressed, Hopeless 3 -  PHQ - 2 Score 6 3  Altered sleeping - 3  Tired, decreased energy - 3  Change in appetite - 3  Feeling bad or failure about yourself  - 2  Trouble concentrating - 2  Moving slowly or fidgety/restless - 1  Suicidal thoughts - 1  PHQ-9 Score - 18   BP 135/82 mmHg  Pulse 93  SpO2 96%  Opioid Risk Score:   Fall Risk Score:  `1  Depression screen PHQ 2/9  Depression screen [facility] 2/9 09/17/2014 04/21/2014  Decreased Interest 3 3  Down, Depressed, Hopeless 3 -  PHQ - 2 Score 6 3  Altered sleeping - 3  Tired, decreased energy - 3  Change in appetite - 3  Feeling bad or failure about yourself  - 2  Trouble concentrating - 2  Moving slowly or fidgety/restless - 1  Suicidal thoughts - 1  PHQ-9 Score - 18    Review of Systems  Constitutional: Positive for diaphoresis and unexpected weight change.  Cardiovascular: Positive for leg swelling.       Generalized edema today  Gastrointestinal: Positive for nausea, vomiting and constipation.  Musculoskeletal: Positive for back pain.       Spasms  Neurological: Positive for dizziness, tremors and numbness.       Tingling  Psychiatric/Behavioral: Positive for confusion and dysphoric mood. The patient is nervous/anxious.        Panic attacks worse  All other systems reviewed and are negative.      Objective:   Physical Exam  Constitutional: She is oriented to person, place, and time. She appears well-developed and well-nourished.  HENT:  Head:  Normocephalic and atraumatic.  Neck: Normal range of motion. Neck supple.  Cardiovascular: Normal rate and regular rhythm.   Pulmonary/Chest: Effort normal and breath sounds normal.  Musculoskeletal: She exhibits edema.  Normal Muscle Bulk and Muscle testing Reveals:  Upper Extremities: Full ROM and Muscle Strength 5/5 Thoracic Paraspinal Tenderness: T-5- T-7 Lumbar Paraspinal Tenderness: L-3- L-5 Lower Extremities: Full ROM and Muscle Strength 5/5 Hypersensitivity toLower Extremities 3+ Pitting Edema Right Lower Extremity Flexion Produces  Pain into Patella Left Lower Extremity Flexion Produces pain into Patella Arises from chair slowly/ using 3 prong cane for support Antalgic gait  Neurological: She is alert and oriented to person, place, and time.  Skin: Skin is warm and dry.  Psychiatric: She has a normal mood and affect.  Nursing note and vitals reviewed.         Assessment & Plan:  1.Chronic Low Back Pain: Refilled: Oxycodone 15 mg one tablet every 6 hours as needed #120 and MS Contin 100 mg one tablet every 12 hours #60. 2. Degenerative Disk Disease:Continue exercise, and heat therapy. Continue Current Medication Regime.  3. Osteoarthritis of Bilateral Knees: On Compound Ointment. Continue exercise and heat therapy.  4. Migraine Headaches: On Maxalt.  5. Smoking Cessation: Cutting Back ( 5 cigarettes a day): Encouraged to Continue with smoking cessation  20 minutes of face to face patient care time was spent during this visit. All questions were encouraged and answered.   F/U in 1 month.

## 2014-10-06 ENCOUNTER — Ambulatory Visit (INDEPENDENT_AMBULATORY_CARE_PROVIDER_SITE_OTHER): Payer: Medicare Other | Admitting: Family Medicine

## 2014-10-06 ENCOUNTER — Encounter: Payer: Self-pay | Admitting: Family Medicine

## 2014-10-06 VITALS — BP 142/83 | HR 105 | Ht 69.5 in | Wt 257.0 lb

## 2014-10-06 DIAGNOSIS — Z720 Tobacco use: Secondary | ICD-10-CM

## 2014-10-06 DIAGNOSIS — E039 Hypothyroidism, unspecified: Secondary | ICD-10-CM

## 2014-10-06 DIAGNOSIS — L821 Other seborrheic keratosis: Secondary | ICD-10-CM | POA: Diagnosis not present

## 2014-10-06 DIAGNOSIS — E785 Hyperlipidemia, unspecified: Secondary | ICD-10-CM | POA: Diagnosis not present

## 2014-10-06 MED ORDER — LEVOTHYROXINE SODIUM 25 MCG PO TABS: ORAL_TABLET | ORAL | Status: DC

## 2014-10-06 MED ORDER — PRAVASTATIN SODIUM 40 MG PO TABS: 40.0000 mg | ORAL_TABLET | Freq: Every day | ORAL | Status: DC

## 2014-10-06 MED ORDER — NICOTINE 21 MG/24HR TD PT24: 21.0000 mg | MEDICATED_PATCH | Freq: Every day | TRANSDERMAL | Status: DC

## 2014-10-06 NOTE — Progress Notes (Signed)
Subjective:    Patient ID: [name], female    DOB: May 09, 1960, 54 y.o.   MRN: [id number]  HPI tob abuse - says the Chantix didn't work. Says she is interested in the nicotine patch. Has never used it before.  She currently smokes about half a pack per day.  Hypothyroid - needs thyoroid med refilled.   No recent skin or hair changes or weight changes.  Hyperlipidemia - refill the pravastatin.  Tolerated meds well without any myalgias or side effects.  Skin lesion on her temples she would like to remove.   She also has a mole on her right outer breast that she would like me to look at today.  Review of Systems  BP 142/83 mmHg  Pulse 105  Ht 5' 9.5" (1.765 m)  Wt 257 lb (116.574 kg)  BMI 37.42 kg/m2    Allergies  Allergen Reactions  . Aspirin Other (See Comments)    Abdominal bleeding   . Ibuprofen Swelling  . Imitrex [Sumatriptan Base] Other (See Comments)    Drops BP   . Norvasc [Amlodipine Besylate] Swelling  . Nsaids Swelling  . Tape     Skin blisters under surgical tape    Past Medical History  Diagnosis Date  . Chronic pain syndrome   . Intervertebral lumbar disc disorder with myelopathy, lumbar region   . Facet syndrome, lumbar   . Primary localized osteoarthrosis, lower leg   . Degeneration of lumbar or lumbosacral intervertebral disc   . Unspecified musculoskeletal disorders and symptoms referable to neck     cervical/trapezius  . Migraine without aura, with intractable migraine, so stated, without mention of status migrainosus   . Depression   . Lumbosacral spondylosis without myelopathy   . Allergy   . Anxiety   . Cancer     cancerous nodules on thyroid  . Cancer     basal cell Cancer-nose  . COPD (chronic obstructive pulmonary disease)   . GERD (gastroesophageal reflux disease)   . Hyperlipidemia   . Thyroid disease     hypothyroid    Past Surgical History  Procedure Laterality Date  . Tonsillectomy  09/1972  . Ulnar nerve  entrapment  Feb 1995    left elbow  . Carpal tunnel release  07/1993    both hands  . Tubal ligation  Sept 1999  . Hemorroidectomy  July 2003  . Knee arthroplasty  09/2001, 05/2003    left knee, chondromalasia  . Abdominal hysterectomy  May 2004  . Replacement total knee  Nov 2005    Left   . Thyroid lobectomy  01/2005    malignant area removed from right lobe  . Basel cell carcinoma  06/2005, 07/2005    nasal tip and reconstruction  . Knee arthroscopy  jan 2005    Right knee, tisse release, chrondromalacia  . Colonoscopy    . Thyroid surgery      thyroid lobe removal    Social History   Social History  . Marital Status: Married    Spouse Name: [name]  . Number of Children: 1  . Years of Education: some colle   Occupational History  . On disability    Social History Main Topics  . Smoking status: Former Smoker -- 0.50 packs/day for .5 years    Types: Cigarettes    Quit date: 02/03/2012  . Smokeless tobacco: Never Used     Comment: going to try e-sticks to quit  . Alcohol Use: No  .  Drug Use: No  . Sexual Activity:    Partners: Male   Other Topics Concern  . Not on file   Social History Narrative   8-10 cups of soda a day.  On disability. No regular exercise.      Family History  Problem Relation Age of Onset  . Depression Mother   . Hypertension Mother   . Hypertension Father   . Heart failure Father   . Diabetes Father   . Heart attack Father   . Colon cancer Neg Hx   . Esophageal cancer Neg Hx   . Stomach cancer Neg Hx   . Rectal cancer Neg Hx     Outpatient Encounter Prescriptions as of 10/06/2014  Medication Sig  . citalopram (CELEXA) 40 MG tablet take 1 tablet by mouth at bedtime  . gabapentin (NEURONTIN) 800 MG tablet take 1 tablet by mouth four times a day  . levothyroxine (SYNTHROID, LEVOTHROID) 25 MCG tablet take 1 tablet by mouth every morning BEFORE BREAKFAST  . morphine (MS CONTIN) 100 MG 12 hr tablet Take 1 tablet (100 mg total) by mouth 2  (two) times daily.  . nortriptyline (PAMELOR) 50 MG capsule Take 2 capsules (100 mg total) by mouth at bedtime.  Marland Kitchen oxyCODONE (ROXICODONE) 15 MG immediate release tablet Take 1 tablet (15 mg total) by mouth every 6 (six) hours as needed.  . pravastatin (PRAVACHOL) 40 MG tablet Take 1 tablet (40 mg total) by mouth daily.  . promethazine (PHENERGAN) 12.5 MG tablet TAKE 1 TABLET BY MOUTH EVERY 12 HOURS  . rizatriptan (MAXALT) 10 MG tablet Take 1 tablet (10 mg total) by mouth as needed. May repeat in 2 hours if needed  . tiZANidine (ZANAFLEX) 2 MG tablet Take 1 tablet (2 mg total) by mouth 3 (three) times daily as needed for muscle spasms.  . [DISCONTINUED] levothyroxine (SYNTHROID, LEVOTHROID) 25 MCG tablet take 1 tablet by mouth every morning BEFORE BREAKFAST  . [DISCONTINUED] pravastatin (PRAVACHOL) 40 MG tablet take 1 tablet by mouth daily  . nicotine (NICODERM CQ - DOSED IN MG/24 HOURS) 21 mg/24hr patch Place 1 patch (21 mg total) onto the skin daily.   No facility-administered encounter medications on file as of 10/06/2014.          Objective:   Physical Exam  Constitutional: She is oriented to person, place, and time. She appears well-developed and well-nourished.  HENT:  Head: Normocephalic and atraumatic.  Neck: Neck supple. No thyromegaly present.  Cardiovascular: Normal rate, regular rhythm and normal heart sounds.   Pulmonary/Chest: Effort normal and breath sounds normal.  Lymphadenopathy:    She has no cervical adenopathy.  Neurological: She is alert and oriented to person, place, and time.  Skin: Skin is warm and dry.  She has several seborrheic keratoses clustered over both temples. They vary in size from 1 cm to about 2 mm.  Psychiatric: She has a normal mood and affect. Her behavior is normal.          Assessment & Plan:  Tob abuse -  We discussed several options. I do think she'll be great candidate for the nicotine patch. I gave her handout with the protocol on how  to taper the patches. She will do the 21 mg patch for 6 weeks and then stepdown. Perception sent to pharmacy. Also recommended that she's become or lozenges for acute relief especially during the first hour before the patch kicks in and throughout the day as needed to help her taper.  Hypothyroidism-due to recheck TSH.   Hyperlipidemia-due to recheck lipid panel. Refills sent for pravastatin.   seborrheic keratoses on her temples bilaterally. Discussed diagnosis and that these are typically benign and usually genetically inherited. They are not from same damage. We discussed options for treatment if she would like removed. She should respond well to cryotherapy. We did discuss that if any of the lesions recur that she would probably benefit from repeat cryotherapy   Cryotherapy Procedure Note  Pre-operative Diagnosis: seb keratosis  Post-operative Diagnosis: same  Locations: bilateral face over the temples bilateally   Indications: Irritation  Anesthesia: not required    Procedure Details  Patient informed of risks (permanent scarring, infection, light or dark discoloration, bleeding, infection, weakness, numbness and recurrence of the lesion) and benefits of the procedure and verbal informed consent obtained.  The areas are treated with liquid nitrogen therapy, frozen until ice ball extended 2 mm beyond lesion, allowed to thaw, and treated again. The patient tolerated procedure well.  The patient was instructed on post-op care, warned that there may be blister formation, redness and pain. Recommend OTC analgesia as needed for pain.  Condition: Stable  Complications: none.  Plan: 1. Instructed to keep the area dry and covered for 24-48h and clean thereafter. 2. Warning signs of infection were reviewed.   3. Recommended that the patient use OTC acetaminophen as needed for pain.  Once the lesions themselves start to peel in a week or 2 she can just use Vaseline for healing. No  alcohol or peroxide and just regular soap and water in the shower. 4. Return PRN.

## 2014-10-09 ENCOUNTER — Other Ambulatory Visit: Payer: Self-pay | Admitting: *Deleted

## 2014-10-09 MED ORDER — RIZATRIPTAN BENZOATE 10 MG PO TABS: 10.0000 mg | ORAL_TABLET | ORAL | Status: DC | PRN

## 2014-10-14 ENCOUNTER — Other Ambulatory Visit: Payer: Self-pay | Admitting: Registered Nurse

## 2014-10-21 ENCOUNTER — Encounter: Payer: Medicare Other | Attending: Physical Medicine and Rehabilitation | Admitting: Physical Medicine & Rehabilitation

## 2014-10-21 ENCOUNTER — Encounter: Payer: Self-pay | Admitting: Physical Medicine & Rehabilitation

## 2014-10-21 ENCOUNTER — Other Ambulatory Visit: Payer: Self-pay | Admitting: Physical Medicine & Rehabilitation

## 2014-10-21 VITALS — BP 158/76 | HR 107 | Resp 14

## 2014-10-21 DIAGNOSIS — Z5181 Encounter for therapeutic drug level monitoring: Secondary | ICD-10-CM

## 2014-10-21 DIAGNOSIS — Z79899 Other long term (current) drug therapy: Secondary | ICD-10-CM | POA: Diagnosis not present

## 2014-10-21 DIAGNOSIS — G894 Chronic pain syndrome: Secondary | ICD-10-CM

## 2014-10-21 DIAGNOSIS — M797 Fibromyalgia: Secondary | ICD-10-CM | POA: Diagnosis not present

## 2014-10-21 DIAGNOSIS — M7918 Myalgia, other site: Secondary | ICD-10-CM

## 2014-10-21 DIAGNOSIS — Z760 Encounter for issue of repeat prescription: Secondary | ICD-10-CM | POA: Insufficient documentation

## 2014-10-21 MED ORDER — OXYCODONE HCL 15 MG PO TABS: 15.0000 mg | ORAL_TABLET | Freq: Four times a day (QID) | ORAL | Status: DC | PRN

## 2014-10-21 MED ORDER — MORPHINE SULFATE ER 100 MG PO TBCR: 100.0000 mg | EXTENDED_RELEASE_TABLET | Freq: Two times a day (BID) | ORAL | Status: DC

## 2014-10-21 NOTE — Progress Notes (Signed)
After informed consent and preparation of the skin with isopropyl alcohol, I injected the right lower rhomboid with 2cc of 1% lidocaine. The patient tolerated well, and no complications were experienced. Post-injection instructions were provided.   Meds were refilled today including oxycodone and ms contin with second rx's for next month  Meredith Staggers, MD, [facility] 10/21/2014 .

## 2014-10-21 NOTE — Patient Instructions (Signed)
PLEASE CALL ME WITH ANY PROBLEMS OR QUESTIONS (#[phone]).  HAVE A GOOD DAY!    

## 2014-10-22 LAB — PMP ALCOHOL METABOLITE (ETG): ETGU: NEGATIVE ng/mL

## 2014-10-26 LAB — OPIATES/OPIOIDS (LC/MS-MS)
CODEINE URINE: NEGATIVE ng/mL (ref <50)
HYDROCODONE: NEGATIVE ng/mL (ref <50)
Hydromorphone: 220 ng/mL (ref <50)
MORPHINE: 32999 ng/mL (ref <50)
NORHYDROCODONE, UR: NEGATIVE ng/mL (ref <50)
Noroxycodone, Ur: NEGATIVE ng/mL — AB (ref <50)
Oxycodone, ur: NEGATIVE ng/mL — AB (ref <50)
Oxymorphone: NEGATIVE ng/mL — AB (ref <50)

## 2014-10-27 LAB — PRESCRIPTION MONITORING PROFILE (SOLSTAS)
Amphetamine/Meth: NEGATIVE ng/mL
BUPRENORPHINE, URINE: NEGATIVE ng/mL
Barbiturate Screen, Urine: NEGATIVE ng/mL
Benzodiazepine Screen, Urine: NEGATIVE ng/mL
COCAINE METABOLITES: NEGATIVE ng/mL
Cannabinoid Scrn, Ur: NEGATIVE ng/mL
Carisoprodol, Urine: NEGATIVE ng/mL
Creatinine, Urine: 30.73 mg/dL (ref >20.0)
FENTANYL URINE: NEGATIVE ng/mL
MDMA URINE: NEGATIVE ng/mL
MEPERIDINE UR: NEGATIVE ng/mL
METHADONE SCREEN, URINE: NEGATIVE ng/mL
NITRITES URINE, INITIAL: NEGATIVE ug/mL
Oxycodone Screen, Ur: NEGATIVE ng/mL
Propoxyphene: NEGATIVE ng/mL
TAPENTADOLUR: NEGATIVE ng/mL
Tramadol Scrn, Ur: NEGATIVE ng/mL
ZOLPIDEM, URINE: NEGATIVE ng/mL
pH, Initial: 6.5 pH (ref 4.5–8.9)

## 2014-11-03 NOTE — Progress Notes (Signed)
Urine drug screen for this encounter is consistent for prescribed medication 

## 2014-12-01 ENCOUNTER — Other Ambulatory Visit: Payer: Self-pay | Admitting: *Deleted

## 2014-12-01 DIAGNOSIS — M7918 Myalgia, other site: Secondary | ICD-10-CM

## 2014-12-01 DIAGNOSIS — G9050 Complex regional pain syndrome I, unspecified: Secondary | ICD-10-CM

## 2014-12-01 DIAGNOSIS — M47816 Spondylosis without myelopathy or radiculopathy, lumbar region: Secondary | ICD-10-CM

## 2014-12-01 MED ORDER — TIZANIDINE HCL 2 MG PO TABS: 2.0000 mg | ORAL_TABLET | Freq: Three times a day (TID) | ORAL | Status: DC | PRN

## 2014-12-01 MED ORDER — NORTRIPTYLINE HCL 50 MG PO CAPS: 100.0000 mg | ORAL_CAPSULE | Freq: Every day | ORAL | Status: DC

## 2014-12-21 ENCOUNTER — Encounter: Payer: Medicare Other | Attending: Physical Medicine and Rehabilitation | Admitting: Registered Nurse

## 2014-12-21 ENCOUNTER — Encounter: Payer: Self-pay | Admitting: Registered Nurse

## 2014-12-21 VITALS — BP 150/82 | HR 102

## 2014-12-21 DIAGNOSIS — M797 Fibromyalgia: Secondary | ICD-10-CM | POA: Diagnosis not present

## 2014-12-21 DIAGNOSIS — Z760 Encounter for issue of repeat prescription: Secondary | ICD-10-CM | POA: Insufficient documentation

## 2014-12-21 DIAGNOSIS — Z79899 Other long term (current) drug therapy: Secondary | ICD-10-CM

## 2014-12-21 DIAGNOSIS — M5136 Other intervertebral disc degeneration, lumbar region: Secondary | ICD-10-CM | POA: Diagnosis not present

## 2014-12-21 DIAGNOSIS — G894 Chronic pain syndrome: Secondary | ICD-10-CM

## 2014-12-21 DIAGNOSIS — M1711 Unilateral primary osteoarthritis, right knee: Secondary | ICD-10-CM | POA: Diagnosis not present

## 2014-12-21 DIAGNOSIS — M51369 Other intervertebral disc degeneration, lumbar region without mention of lumbar back pain or lower extremity pain: Secondary | ICD-10-CM

## 2014-12-21 DIAGNOSIS — M1712 Unilateral primary osteoarthritis, left knee: Secondary | ICD-10-CM

## 2014-12-21 DIAGNOSIS — Z720 Tobacco use: Secondary | ICD-10-CM

## 2014-12-21 DIAGNOSIS — M7918 Myalgia, other site: Secondary | ICD-10-CM

## 2014-12-21 DIAGNOSIS — Z5181 Encounter for therapeutic drug level monitoring: Secondary | ICD-10-CM

## 2014-12-21 MED ORDER — OXYCODONE HCL 15 MG PO TABS: 15.0000 mg | ORAL_TABLET | Freq: Four times a day (QID) | ORAL | Status: DC | PRN

## 2014-12-21 MED ORDER — MORPHINE SULFATE ER 100 MG PO TBCR: 100.0000 mg | EXTENDED_RELEASE_TABLET | Freq: Two times a day (BID) | ORAL | Status: DC

## 2014-12-21 NOTE — Progress Notes (Signed)
Subjective:    Patient ID: [name], female    DOB: [birth year], 54 y.o.   MRN: [id number]  HPI: [name] is a 54 year old female who returns for follow up for chronic pain and medication refill. She says her pain is located in her lower back mainly left side and bilateral knees right greater than left. She rates her pain 7. Her current exercise regime is performing chair exercises, using bands and stretching.  Pain Inventory Average Pain 8 Pain Right Now 7 My pain is constant, sharp, burning, dull, stabbing, tingling and aching  In the last 24 hours, has pain interfered with the following? General activity 10 Relation with others 9 Enjoyment of life 10 What TIME of day is your pain at its worst? All the time Sleep (in general) Poor  Pain is worse with: walking, bending, standing and some activites Pain improves with: rest, heat/ice, medication, TENS and injections Relief from Meds: 6  Mobility walk with assistance use a cane how many minutes can you walk? 5-7 ability to climb steps?  yes do you drive?  yes Do you have any goals in this area?  yes  Function disabled: date disabled 08/2005 I need assistance with the following:  bathing, meal prep, household duties and shopping Do you have any goals in this area?  yes  Neuro/Psych weakness numbness tremor tingling trouble walking spasms dizziness confusion depression anxiety  Prior Studies Any changes since last visit?  no  Physicians involved in your care Any changes since last visit?  no   Family History  Problem Relation Age of Onset  . Depression Mother   . Hypertension Mother   . Hypertension Father   . Heart failure Father   . Diabetes Father   . Heart attack Father   . Colon cancer Neg Hx   . Esophageal cancer Neg Hx   . Stomach cancer Neg Hx   . Rectal cancer Neg Hx    Social History   Social History  . Marital Status: Married    Spouse Name: [name]  . Number  of Children: 1  . Years of Education: some colle   Occupational History  . On disability    Social History Main Topics  . Smoking status: Current Every Day Smoker -- 0.50 packs/day    Types: Cigarettes    Last Attempt to Quit: 02/03/2012  . Smokeless tobacco: Never Used     Comment: going to try e-sticks to quit  . Alcohol Use: No  . Drug Use: No  . Sexual Activity:    Partners: Male   Other Topics Concern  . None   Social History Narrative   8-10 cups of soda a day.  On disability. No regular exercise.     Past Surgical History  Procedure Laterality Date  . Tonsillectomy  09/1972  . Ulnar nerve entrapment  Feb 1995    left elbow  . Carpal tunnel release  07/1993    both hands  . Tubal ligation  Sept 1999  . Hemorroidectomy  July 2003  . Knee arthroplasty  09/2001, 05/2003    left knee, chondromalasia  . Abdominal hysterectomy  May 2004  . Replacement total knee  Nov 2005    Left   . Thyroid lobectomy  01/2005    malignant area removed from right lobe  . Basel cell carcinoma  06/2005, 07/2005    nasal tip and reconstruction  . Knee arthroscopy  jan 2005  Right knee, tisse release, chrondromalacia  . Colonoscopy    . Thyroid surgery      thyroid lobe removal   Past Medical History  Diagnosis Date  . Chronic pain syndrome   . Intervertebral lumbar disc disorder with myelopathy, lumbar region   . Facet syndrome, lumbar   . Primary localized osteoarthrosis, lower leg   . Degeneration of lumbar or lumbosacral intervertebral disc   . Unspecified musculoskeletal disorders and symptoms referable to neck     cervical/trapezius  . Migraine without aura, with intractable migraine, so stated, without mention of status migrainosus   . Depression   . Lumbosacral spondylosis without myelopathy   . Allergy   . Anxiety   . Cancer ([place])     cancerous nodules on thyroid  . Cancer ([place])     basal cell Cancer-nose  . COPD (chronic obstructive pulmonary disease) ([place])   . GERD  (gastroesophageal reflux disease)   . Hyperlipidemia   . Thyroid disease     hypothyroid   BP 152/77 mmHg  Pulse 111  SpO2 94%  Opioid Risk Score:   Fall Risk Score:  `1  Depression screen PHQ 2/9  Depression screen [facility] 2/9 09/17/2014 04/21/2014  Decreased Interest 3 3  Down, Depressed, Hopeless 3 -  PHQ - 2 Score 6 3  Altered sleeping - 3  Tired, decreased energy - 3  Change in appetite - 3  Feeling bad or failure about yourself  - 2  Trouble concentrating - 2  Moving slowly or fidgety/restless - 1  Suicidal thoughts - 1  PHQ-9 Score - 18     Review of Systems  Constitutional: Positive for diaphoresis, appetite change and unexpected weight change.  Respiratory: Positive for cough.   Cardiovascular: Positive for leg swelling.  Gastrointestinal: Positive for nausea, vomiting and constipation.  Musculoskeletal:       Spasms  Neurological: Positive for dizziness, weakness and numbness.       Gait instability Tingling   Psychiatric/Behavioral: Positive for confusion. The patient is nervous/anxious.        Depression  All other systems reviewed and are negative.      Objective:   Physical Exam  Constitutional: She is oriented to person, place, and time. She appears well-developed and well-nourished.  HENT:  Head: Normocephalic and atraumatic.  Neck: Normal range of motion. Neck supple.  Cardiovascular: Normal rate and regular rhythm.   Pulmonary/Chest: Effort normal and breath sounds normal.  Musculoskeletal:  Normal Muscle Bulk and Muscle Testing Reveals: Upper Extremities: Full ROM and Muscle Strength 5/5 Thoracic Paraspinal Tenderness: T-7- T-9 Lower Extremities: Full ROM and Muscle Strength 5/5 Bilateral Lower Extremities Flexion Produces Pain into Patella's Arises from chair slowly using three prong gait Antalgic Gait  Neurological: She is alert and oriented to person, place, and time.  Skin: Skin is warm and dry.  Psychiatric: She has a normal mood and  affect.  Nursing note and vitals reviewed.         Assessment & Plan:  1.Chronic Low Back Pain: Refilled: Oxycodone 15 mg one tablet every 6 hours as needed #120 and MS Contin 100 mg one tablet every 12 hours #60. 2. Degenerative Disk Disease:Continue exercise, and heat therapy. Continue Current Medication Regime.  3. Osteoarthritis of Bilateral Knees: On Compound Ointment. Continue exercise and heat therapy.  4. Migraine Headaches: On Maxalt.  5. Smoking Cessation: Cutting Back ( 3 cigarettes a day): Encouraged to Continue with smoking cessation  20 minutes of face to face  patient care time was spent during this visit. All questions were encouraged and answered.   F/U in 1 month.

## 2014-12-26 ENCOUNTER — Other Ambulatory Visit: Payer: Self-pay | Admitting: Registered Nurse

## 2015-01-14 ENCOUNTER — Encounter: Payer: Medicare Other | Attending: Physical Medicine and Rehabilitation | Admitting: Registered Nurse

## 2015-01-14 ENCOUNTER — Encounter: Payer: Self-pay | Admitting: Registered Nurse

## 2015-01-14 VITALS — BP 143/73 | HR 103

## 2015-01-14 DIAGNOSIS — Z79899 Other long term (current) drug therapy: Secondary | ICD-10-CM

## 2015-01-14 DIAGNOSIS — M5136 Other intervertebral disc degeneration, lumbar region: Secondary | ICD-10-CM

## 2015-01-14 DIAGNOSIS — Z760 Encounter for issue of repeat prescription: Secondary | ICD-10-CM | POA: Diagnosis not present

## 2015-01-14 DIAGNOSIS — M1712 Unilateral primary osteoarthritis, left knee: Secondary | ICD-10-CM

## 2015-01-14 DIAGNOSIS — M1711 Unilateral primary osteoarthritis, right knee: Secondary | ICD-10-CM | POA: Diagnosis not present

## 2015-01-14 DIAGNOSIS — M797 Fibromyalgia: Secondary | ICD-10-CM

## 2015-01-14 DIAGNOSIS — M7918 Myalgia, other site: Secondary | ICD-10-CM

## 2015-01-14 DIAGNOSIS — Z720 Tobacco use: Secondary | ICD-10-CM

## 2015-01-14 DIAGNOSIS — Z5181 Encounter for therapeutic drug level monitoring: Secondary | ICD-10-CM

## 2015-01-14 DIAGNOSIS — G894 Chronic pain syndrome: Secondary | ICD-10-CM

## 2015-01-14 DIAGNOSIS — K5909 Other constipation: Secondary | ICD-10-CM

## 2015-01-14 MED ORDER — MORPHINE SULFATE ER 100 MG PO TBCR: 100.0000 mg | EXTENDED_RELEASE_TABLET | Freq: Two times a day (BID) | ORAL | Status: DC

## 2015-01-14 MED ORDER — OXYCODONE HCL 15 MG PO TABS: 15.0000 mg | ORAL_TABLET | Freq: Four times a day (QID) | ORAL | Status: DC | PRN

## 2015-01-14 MED ORDER — LINACLOTIDE 145 MCG PO CAPS: 145.0000 ug | ORAL_CAPSULE | Freq: Every day | ORAL | Status: DC

## 2015-01-14 NOTE — Progress Notes (Signed)
Subjective:    Patient ID: [name], female    DOB: 11/11/1960, 54 y.o.   MRN: [id number]  HPI: [name] is a 54 year old female who returns for follow up for chronic pain and medication refill. She says her pain is located in her lower back mainly left side, left thigh radiating into lower extremity into her left knee and right knee. She rates her pain 7. Her current exercise regime is performing chair exercises, stretching using bands and lifting light weights ( 5lbs). Also complaing about constipation she has used colace, dulcolax, ex-lax and currently has been taking MiraLax daily for the last five years. Ineffective we will prescribe  Linzess today, she verbalizes understanding.  Pain Inventory Average Pain 8 Pain Right Now 7 My pain is constant, sharp, burning, dull, stabbing, tingling and aching  In the last 24 hours, has pain interfered with the following? General activity 9 Relation with others 9 Enjoyment of life 9 What TIME of day is your pain at its worst? All the time Sleep (in general) Poor  Pain is worse with: walking, bending, standing and some activites Pain improves with: rest, heat/ice, therapy/exercise, medication, TENS and injections Relief from Meds: 7  Mobility walk with assistance use a cane how many minutes can you walk? 5-7 ability to climb steps?  yes do you drive?  yes Do you have any goals in this area?  yes  Function disabled: date disabled 08/2005 I need assistance with the following:  bathing, meal prep, household duties and shopping Do you have any goals in this area?  yes  Neuro/Psych weakness numbness tremor tingling trouble walking spasms dizziness confusion depression anxiety  Prior Studies Any changes since last visit?  no  Physicians involved in your care Any changes since last visit?  no   Family History  Problem Relation Age of Onset  . Depression Mother   . Hypertension Mother   .  Hypertension Father   . Heart failure Father   . Diabetes Father   . Heart attack Father   . Colon cancer Neg Hx   . Esophageal cancer Neg Hx   . Stomach cancer Neg Hx   . Rectal cancer Neg Hx    Social History   Social History  . Marital Status: Married    Spouse Name: [name]  . Number of Children: 1  . Years of Education: some colle   Occupational History  . On disability    Social History Main Topics  . Smoking status: Current Every Day Smoker -- 0.50 packs/day    Types: Cigarettes    Last Attempt to Quit: 02/03/2012  . Smokeless tobacco: Never Used     Comment: going to try e-sticks to quit  . Alcohol Use: No  . Drug Use: No  . Sexual Activity:    Partners: Male   Other Topics Concern  . None   Social History Narrative   8-10 cups of soda a day.  On disability. No regular exercise.     Past Surgical History  Procedure Laterality Date  . Tonsillectomy  09/1972  . Ulnar nerve entrapment  Feb 1995    left elbow  . Carpal tunnel release  07/1993    both hands  . Tubal ligation  Sept 1999  . Hemorroidectomy  July 2003  . Knee arthroplasty  09/2001, 05/2003    left knee, chondromalasia  . Abdominal hysterectomy  May 2004  . Replacement total knee  Nov 2005  Left   . Thyroid lobectomy  01/2005    malignant area removed from right lobe  . Basel cell carcinoma  06/2005, 07/2005    nasal tip and reconstruction  . Knee arthroscopy  jan 2005    Right knee, tisse release, chrondromalacia  . Colonoscopy    . Thyroid surgery      thyroid lobe removal   Past Medical History  Diagnosis Date  . Chronic pain syndrome   . Intervertebral lumbar disc disorder with myelopathy, lumbar region   . Facet syndrome, lumbar   . Primary localized osteoarthrosis, lower leg   . Degeneration of lumbar or lumbosacral intervertebral disc   . Unspecified musculoskeletal disorders and symptoms referable to neck     cervical/trapezius  . Migraine without aura, with intractable  migraine, so stated, without mention of status migrainosus   . Depression   . Lumbosacral spondylosis without myelopathy   . Allergy   . Anxiety   . Cancer ([place])     cancerous nodules on thyroid  . Cancer ([place])     basal cell Cancer-nose  . COPD (chronic obstructive pulmonary disease) ([place])   . GERD (gastroesophageal reflux disease)   . Hyperlipidemia   . Thyroid disease     hypothyroid   BP 152/75 mmHg  Pulse 108  SpO2 95%  Opioid Risk Score:   Fall Risk Score:  `1  Depression screen PHQ 2/9  Depression screen [facility] 2/9 09/17/2014 04/21/2014  Decreased Interest 3 3  Down, Depressed, Hopeless 3 -  PHQ - 2 Score 6 3  Altered sleeping - 3  Tired, decreased energy - 3  Change in appetite - 3  Feeling bad or failure about yourself  - 2  Trouble concentrating - 2  Moving slowly or fidgety/restless - 1  Suicidal thoughts - 1  PHQ-9 Score - 18     Review of Systems  Constitutional: Positive for diaphoresis and appetite change.  Cardiovascular: Positive for leg swelling.  Gastrointestinal: Positive for nausea, vomiting and constipation.  Musculoskeletal: Positive for gait problem.       Spasms  Neurological: Positive for tremors, weakness and numbness.       Tingling  Psychiatric/Behavioral: Positive for confusion. The patient is nervous/anxious.        Depression  All other systems reviewed and are negative.      Objective:   Physical Exam  Constitutional: She is oriented to person, place, and time. She appears well-developed and well-nourished.  HENT:  Head: Normocephalic and atraumatic.  Neck: Normal range of motion. Neck supple.  Cardiovascular: Normal rate and regular rhythm.   Pulmonary/Chest: Effort normal and breath sounds normal.  Musculoskeletal:  Normal Muscle Bulk and Muscle Testing Reveals: Upper Extremities: Full ROM and Muscle Strength 5/5 Thoracic and Lumbar Hypersensitivity Lower Extremities: Full ROM and Muscle Strength 5/5 Right Lower Extremity  Flexion Produces Pain into Patella Left Lower Extremity Flexion Produces Pain into lower Extremity Arises from chair slowly/ using three prong cane for support Antalgic Gait   Neurological: She is alert and oriented to person, place, and time.  Skin: Skin is warm and dry.  Psychiatric: She has a normal mood and affect.  Nursing note and vitals reviewed.         Assessment & Plan:  1.Chronic Low Back Pain: Refilled: Oxycodone 15 mg one tablet every 6 hours as needed #120 and MS Contin 100 mg one tablet every 12 hours #60. 2. Degenerative Disk Disease:Continue exercise, and heat therapy. Continue Current Medication  Regime.  3. Osteoarthritis of Bilateral Knees: On Compound Ointment. Continue exercise and heat therapy.  4. Migraine Headaches: On Maxalt.  5. Smoking Cessation: Cutting Back ( 5 cigarettes a day): Encouraged to Continue with smoking cessation 6. Constipation: RX: Linzess  20 minutes of face to face patient care time was spent during this visit. All questions were encouraged and answered.   F/U in 2 months.

## 2015-03-16 ENCOUNTER — Encounter: Payer: Medicare Other | Attending: Physical Medicine and Rehabilitation | Admitting: Physical Medicine & Rehabilitation

## 2015-03-16 ENCOUNTER — Encounter: Payer: Self-pay | Admitting: Physical Medicine & Rehabilitation

## 2015-03-16 VITALS — BP 135/96 | HR 96

## 2015-03-16 DIAGNOSIS — M7918 Myalgia, other site: Secondary | ICD-10-CM

## 2015-03-16 DIAGNOSIS — G9050 Complex regional pain syndrome I, unspecified: Secondary | ICD-10-CM

## 2015-03-16 DIAGNOSIS — Z760 Encounter for issue of repeat prescription: Secondary | ICD-10-CM | POA: Insufficient documentation

## 2015-03-16 DIAGNOSIS — G894 Chronic pain syndrome: Secondary | ICD-10-CM

## 2015-03-16 DIAGNOSIS — Z79899 Other long term (current) drug therapy: Secondary | ICD-10-CM

## 2015-03-16 DIAGNOSIS — M797 Fibromyalgia: Secondary | ICD-10-CM | POA: Diagnosis not present

## 2015-03-16 DIAGNOSIS — Z5181 Encounter for therapeutic drug level monitoring: Secondary | ICD-10-CM

## 2015-03-16 MED ORDER — OXYCODONE HCL 15 MG PO TABS: 15.0000 mg | ORAL_TABLET | Freq: Four times a day (QID) | ORAL | Status: DC | PRN

## 2015-03-16 MED ORDER — MORPHINE SULFATE ER 100 MG PO TBCR: 100.0000 mg | EXTENDED_RELEASE_TABLET | Freq: Two times a day (BID) | ORAL | Status: DC

## 2015-03-16 NOTE — Patient Instructions (Signed)
  PLEASE CALL ME WITH ANY PROBLEMS OR QUESTIONS (#[phone]).      

## 2015-03-16 NOTE — Progress Notes (Signed)
Procedure Note  Trigger Point Injection   After informed consent and preparation of the skin with isopropyl alcohol, I injected the left lower rhomboid and left lumbar paraspinals at L5 with 2cc of 1% lidocaine.---two injections total. The patient tolerated well, and no complications were experienced. Post-injection instructions were provided.   Additionally I refilled her narcotic prescriptions today. She also submitted urine for UDS.

## 2015-03-21 LAB — TOXASSURE SELECT,+ANTIDEPR,UR: PDF: 0

## 2015-03-21 LAB — 6-ACETYLMORPHINE,TOXASSURE ADD
6-ACETYLMORPHINE: NEGATIVE
6-acetylmorphine: NOT DETECTED ng/mg creat

## 2015-03-22 NOTE — Progress Notes (Signed)
Urine drug screen for this encounter is consistent for prescribed medication 

## 2015-03-28 ENCOUNTER — Other Ambulatory Visit: Payer: Self-pay | Admitting: Family Medicine

## 2015-04-02 ENCOUNTER — Other Ambulatory Visit: Payer: Self-pay | Admitting: *Deleted

## 2015-04-02 DIAGNOSIS — M47816 Spondylosis without myelopathy or radiculopathy, lumbar region: Secondary | ICD-10-CM

## 2015-04-02 DIAGNOSIS — M7918 Myalgia, other site: Secondary | ICD-10-CM

## 2015-04-02 DIAGNOSIS — G9050 Complex regional pain syndrome I, unspecified: Secondary | ICD-10-CM

## 2015-04-02 MED ORDER — NORTRIPTYLINE HCL 50 MG PO CAPS: 100.0000 mg | ORAL_CAPSULE | Freq: Every day | ORAL | Status: DC

## 2015-04-12 ENCOUNTER — Other Ambulatory Visit: Payer: Self-pay | Admitting: Registered Nurse

## 2015-04-13 ENCOUNTER — Other Ambulatory Visit: Payer: Self-pay | Admitting: *Deleted

## 2015-04-13 MED ORDER — CITALOPRAM HYDROBROMIDE 40 MG PO TABS: 40.0000 mg | ORAL_TABLET | Freq: Every day | ORAL | Status: DC

## 2015-05-10 ENCOUNTER — Encounter: Payer: Self-pay | Admitting: Registered Nurse

## 2015-05-10 ENCOUNTER — Encounter: Payer: Medicare Other | Attending: Physical Medicine and Rehabilitation | Admitting: Registered Nurse

## 2015-05-10 VITALS — BP 118/92 | HR 99 | Resp 14

## 2015-05-10 DIAGNOSIS — M1711 Unilateral primary osteoarthritis, right knee: Secondary | ICD-10-CM

## 2015-05-10 DIAGNOSIS — M7918 Myalgia, other site: Secondary | ICD-10-CM

## 2015-05-10 DIAGNOSIS — Z760 Encounter for issue of repeat prescription: Secondary | ICD-10-CM | POA: Diagnosis not present

## 2015-05-10 DIAGNOSIS — Z5181 Encounter for therapeutic drug level monitoring: Secondary | ICD-10-CM

## 2015-05-10 DIAGNOSIS — M1712 Unilateral primary osteoarthritis, left knee: Secondary | ICD-10-CM | POA: Diagnosis not present

## 2015-05-10 DIAGNOSIS — G894 Chronic pain syndrome: Secondary | ICD-10-CM

## 2015-05-10 DIAGNOSIS — G9050 Complex regional pain syndrome I, unspecified: Secondary | ICD-10-CM | POA: Diagnosis not present

## 2015-05-10 DIAGNOSIS — M5136 Other intervertebral disc degeneration, lumbar region: Secondary | ICD-10-CM

## 2015-05-10 DIAGNOSIS — Z79899 Other long term (current) drug therapy: Secondary | ICD-10-CM

## 2015-05-10 DIAGNOSIS — M797 Fibromyalgia: Secondary | ICD-10-CM | POA: Diagnosis not present

## 2015-05-10 DIAGNOSIS — G90522 Complex regional pain syndrome I of left lower limb: Secondary | ICD-10-CM

## 2015-05-10 MED ORDER — OXYCODONE HCL 15 MG PO TABS: 15.0000 mg | ORAL_TABLET | Freq: Four times a day (QID) | ORAL | Status: DC | PRN

## 2015-05-10 MED ORDER — MORPHINE SULFATE ER 100 MG PO TBCR: 100.0000 mg | EXTENDED_RELEASE_TABLET | Freq: Two times a day (BID) | ORAL | Status: DC

## 2015-05-10 NOTE — Progress Notes (Signed)
Subjective:    Patient ID: [name], female    DOB: May 10, 1960, 54 y.o.   MRN: [id number]  HPI: [name] is a 55 year old female who returns for follow up for chronic pain and medication refill. She states her pain is located in her lower back mainly left side, left hip and bilateral knees left greater than right. She rates her pain 8. Her current exercise regime is performing chair exercises and stretching using bands. S/P Trigger Point Injection Left Rhomboid injection with no relief noted.  Pain Inventory Average Pain 7 Pain Right Now 8 My pain is constant, sharp, burning, dull, stabbing, tingling and aching  In the last 24 hours, has pain interfered with the following? General activity 10 Relation with others 10 Enjoyment of life 10 What TIME of day is your pain at its worst? morning, daytime, evening, night Sleep (in general) Poor  Pain is worse with: walking, bending, standing and some activites Pain improves with: rest, heat/ice, therapy/exercise, medication, TENS and injections Relief from Meds: 5  Mobility use a cane how many minutes can you walk? 5-10 ability to climb steps?  yes do you drive?  yes Do you have any goals in this area?  yes  Function not employed: date last employed 02/24/2003 disabled: date disabled 08/2005 I need assistance with the following:  bathing, meal prep, household duties and shopping Do you have any goals in this area?  yes  Neuro/Psych bladder control problems weakness numbness tremor tingling trouble walking spasms dizziness confusion depression anxiety  Prior Studies Any changes since last visit?  no bone scan x-rays CT/MRI  Physicians involved in your care Any changes since last visit?  no Primary care .   Family History  Problem Relation Age of Onset  . Depression Mother   . Hypertension Mother   . Hypertension Father   . Heart failure Father   . Diabetes Father   . Heart attack  Father   . Colon cancer Neg Hx   . Esophageal cancer Neg Hx   . Stomach cancer Neg Hx   . Rectal cancer Neg Hx    Social History   Social History  . Marital Status: Married    Spouse Name: [name]  . Number of Children: 1  . Years of Education: some colle   Occupational History  . On disability    Social History Main Topics  . Smoking status: Current Every Day Smoker -- 0.50 packs/day    Types: Cigarettes    Last Attempt to Quit: 02/03/2012  . Smokeless tobacco: Never Used     Comment: going to try e-sticks to quit  . Alcohol Use: No  . Drug Use: No  . Sexual Activity:    Partners: Male   Other Topics Concern  . None   Social History Narrative   8-10 cups of soda a day.  On disability. No regular exercise.     Past Surgical History  Procedure Laterality Date  . Tonsillectomy  09/1972  . Ulnar nerve entrapment  Feb 1995    left elbow  . Carpal tunnel release  07/1993    both hands  . Tubal ligation  Sept 1999  . Hemorroidectomy  July 2003  . Knee arthroplasty  09/2001, 05/2003    left knee, chondromalasia  . Abdominal hysterectomy  May 2004  . Replacement total knee  Nov 2005    Left   . Thyroid lobectomy  01/2005    malignant area  removed from right lobe  . Basel cell carcinoma  06/2005, 07/2005    nasal tip and reconstruction  . Knee arthroscopy  jan 2005    Right knee, tisse release, chrondromalacia  . Colonoscopy    . Thyroid surgery      thyroid lobe removal   Past Medical History  Diagnosis Date  . Chronic pain syndrome   . Intervertebral lumbar disc disorder with myelopathy, lumbar region   . Facet syndrome, lumbar   . Primary localized osteoarthrosis, lower leg   . Degeneration of lumbar or lumbosacral intervertebral disc   . Unspecified musculoskeletal disorders and symptoms referable to neck     cervical/trapezius  . Migraine without aura, with intractable migraine, so stated, without mention of status migrainosus   . Depression   . Lumbosacral  spondylosis without myelopathy   . Allergy   . Anxiety   . Cancer ([place])     cancerous nodules on thyroid  . Cancer ([place])     basal cell Cancer-nose  . COPD (chronic obstructive pulmonary disease) ([place])   . GERD (gastroesophageal reflux disease)   . Hyperlipidemia   . Thyroid disease     hypothyroid   BP 154/81 mmHg  Pulse 105  Resp 14  SpO2 96%  Opioid Risk Score:   Fall Risk Score:  `1  Depression screen PHQ 2/9  Depression screen [facility] 2/9 03/16/2015 09/17/2014 04/21/2014  Decreased Interest 3 3 3   Down, Depressed, Hopeless 3 3 -  PHQ - 2 Score 6 6 3   Altered sleeping - - 3  Tired, decreased energy - - 3  Change in appetite - - 3  Feeling bad or failure about yourself  - - 2  Trouble concentrating - - 2  Moving slowly or fidgety/restless - - 1  Suicidal thoughts - - 1  PHQ-9 Score - - 18    `  Review of Systems  Constitutional: Positive for diaphoresis and appetite change.       Bladder control problems   Cardiovascular:       Limb swelling   Gastrointestinal: Positive for nausea, vomiting and constipation.  Neurological: Positive for tremors, weakness and numbness.       Tingling   Psychiatric/Behavioral: Positive for confusion and dysphoric mood. The patient is nervous/anxious.   All other systems reviewed and are negative.      Objective:   Physical Exam  Constitutional: She is oriented to person, place, and time. She appears well-developed and well-nourished.  HENT:  Head: Normocephalic and atraumatic.  Neck: Normal range of motion. Neck supple.  Cardiovascular: Normal rate and regular rhythm.   Pulmonary/Chest: Effort normal and breath sounds normal.  Musculoskeletal:  Normal Muscle Bulk and Muscle Testing Reveals: Upper Extremities: Full ROM and Muscle Strength 5/5 Rhomboid Tenderness: Left Greater than Right Lower Extremities: Right: Full ROM and Muscle Strength 5/5 Right Lower Extremity Flexion Produces Pain into Patella Left Lower Extremity: Full  ROM and Muscle Strength 5/5 Left Lower Extremity Flexion Produces Pain into Left Hip and Left Patella Arises from chair slowly using three prong cane for support Antalgic gait  Neurological: She is alert and oriented to person, place, and time.  Skin: Skin is warm and dry.  Psychiatric: She has a normal mood and affect.  Nursing note and vitals reviewed.         Assessment & Plan:  1.Chronic Low Back Pain: Refilled: Oxycodone 15 mg one tablet every 6 hours as needed #120 and MS Contin 100 mg one  tablet every 12 hours #60. Second script given for the following month. 2. Degenerative Disk Disease:Continue exercise, and heat therapy. Continue Current Medication Regime.  3. Osteoarthritis of Bilateral Knees:  Continue exercise and heat therapy.  4. Migraine Headaches: On Maxalt.  5. Smoking Cessation: Encouraged to Continue with smoking cessation 6. Constipation: Continue Linzess  20 minutes of face to face patient care time was spent during this visit. All questions were encouraged and answered.   F/U in 2 months.

## 2015-05-13 ENCOUNTER — Ambulatory Visit: Payer: Medicare Other | Admitting: Family Medicine

## 2015-05-17 ENCOUNTER — Telehealth: Payer: Self-pay

## 2015-05-17 MED ORDER — TIZANIDINE HCL 2 MG PO TABS: 2.0000 mg | ORAL_TABLET | Freq: Three times a day (TID) | ORAL | Status: DC | PRN

## 2015-05-17 NOTE — Telephone Encounter (Signed)
A refill request came in for Tizanidine 2 mg TID PRN muscle spasms. Please advise on refill? Did not see recent documentation on this.

## 2015-05-17 NOTE — Telephone Encounter (Signed)
i refilled 

## 2015-05-26 ENCOUNTER — Other Ambulatory Visit: Payer: Self-pay | Admitting: Family Medicine

## 2015-05-26 ENCOUNTER — Ambulatory Visit (INDEPENDENT_AMBULATORY_CARE_PROVIDER_SITE_OTHER): Payer: Medicare Other | Admitting: Family Medicine

## 2015-05-26 ENCOUNTER — Encounter: Payer: Self-pay | Admitting: Family Medicine

## 2015-05-26 ENCOUNTER — Ambulatory Visit: Payer: Medicare Other

## 2015-05-26 VITALS — BP 132/81 | HR 102 | Wt 264.0 lb

## 2015-05-26 DIAGNOSIS — E039 Hypothyroidism, unspecified: Secondary | ICD-10-CM

## 2015-05-26 DIAGNOSIS — Z1231 Encounter for screening mammogram for malignant neoplasm of breast: Secondary | ICD-10-CM

## 2015-05-26 DIAGNOSIS — B079 Viral wart, unspecified: Secondary | ICD-10-CM | POA: Diagnosis not present

## 2015-05-26 DIAGNOSIS — L609 Nail disorder, unspecified: Secondary | ICD-10-CM

## 2015-05-26 DIAGNOSIS — G894 Chronic pain syndrome: Secondary | ICD-10-CM

## 2015-05-26 DIAGNOSIS — D509 Iron deficiency anemia, unspecified: Secondary | ICD-10-CM | POA: Diagnosis not present

## 2015-05-26 DIAGNOSIS — E785 Hyperlipidemia, unspecified: Secondary | ICD-10-CM

## 2015-05-26 DIAGNOSIS — R7301 Impaired fasting glucose: Secondary | ICD-10-CM | POA: Diagnosis not present

## 2015-05-26 DIAGNOSIS — L821 Other seborrheic keratosis: Secondary | ICD-10-CM

## 2015-05-26 LAB — COMPLETE METABOLIC PANEL WITH GFR
ALT: 23 U/L (ref 6–29)
AST: 23 U/L (ref 10–35)
Albumin: 4 g/dL (ref 3.6–5.1)
Alkaline Phosphatase: 105 U/L (ref 33–130)
BUN: 7 mg/dL (ref 7–25)
CALCIUM: 9.2 mg/dL (ref 8.6–10.4)
CHLORIDE: 104 mmol/L (ref 98–110)
CO2: 25 mmol/L (ref 20–31)
CREATININE: 0.71 mg/dL (ref 0.50–1.05)
GFR, Est Non African American: >89 mL/min (ref >=60)
Glucose, Bld: 131 mg/dL — ABNORMAL HIGH (ref 65–99)
Potassium: 3.9 mmol/L (ref 3.5–5.3)
Sodium: 140 mmol/L (ref 135–146)
Total Bilirubin: 0.6 mg/dL (ref 0.2–1.2)
Total Protein: 6.3 g/dL (ref 6.1–8.1)

## 2015-05-26 LAB — TSH: TSH: 3.18 mIU/L

## 2015-05-26 LAB — IRON,TIBC AND FERRITIN PANEL
%SAT: 19 % (ref 11–50)
FERRITIN: 42 ng/mL (ref 10–232)
Iron: 64 ug/dL (ref 45–160)
TIBC: 341 ug/dL (ref 250–450)

## 2015-05-26 LAB — HEMOGLOBIN A1C
Hgb A1c MFr Bld: 6.2 % — ABNORMAL HIGH (ref <5.7)
Mean Plasma Glucose: 131 mg/dL

## 2015-05-26 LAB — LIPID PANEL
Cholesterol: 153 mg/dL (ref 125–200)
HDL: 40 mg/dL — AB (ref >=46)
LDL CALC: 67 mg/dL (ref <130)
TRIGLYCERIDES: 228 mg/dL — AB (ref <150)
Total CHOL/HDL Ratio: 3.8 Ratio (ref <=5.0)
VLDL: 46 mg/dL — AB (ref <30)

## 2015-05-26 LAB — MAGNESIUM: Magnesium: 2 mg/dL (ref 1.5–2.5)

## 2015-05-26 NOTE — Progress Notes (Signed)
Subjective:    Patient ID: ODA BREER, female    DOB: Oct 19, 1960, 55 y.o.   MRN: [id number]  HPI Hypothyroidism-she's currently on Levothroid 25 g daily. No skin or hair changes. No major weight changes. Taking medication Regularly on an empty stomach before breakfast. She has noticed that she's had some thinning hair over the last year. She says it's about half the thickness that it was. She denies any major skin changes. History of thyroid cancer.  He also has some vertical nail ridges been there for about 3 years. She just wanted me to look at them today and see if I felt like it could be related to a deficiency.  Complex regional pain syndrome with degenerative arthritis of lumbar spine and other joints. Followed by pain management. Currently on oxycodone and morphine and also on Linzess to counteract constipation.  Review of Systems  BP 132/81 mmHg  Pulse 102  Wt 264 lb (119.75 kg)  SpO2 96%    Allergies  Allergen Reactions  . Aspirin Other (See Comments)    Abdominal bleeding   . Ibuprofen Swelling  . Imitrex [Sumatriptan Base] Other (See Comments)    Drops BP   . Norvasc [Amlodipine Besylate] Swelling  . Nsaids Swelling  . Tape     Skin blisters under surgical tape    Past Medical History  Diagnosis Date  . Chronic pain syndrome   . Intervertebral lumbar disc disorder with myelopathy, lumbar region   . Facet syndrome, lumbar   . Primary localized osteoarthrosis, lower leg   . Degeneration of lumbar or lumbosacral intervertebral disc   . Unspecified musculoskeletal disorders and symptoms referable to neck     cervical/trapezius  . Migraine without aura, with intractable migraine, so stated, without mention of status migrainosus   . Depression   . Lumbosacral spondylosis without myelopathy   . Allergy   . Anxiety   . Cancer ([place])     cancerous nodules on thyroid  . Cancer ([place])     basal cell Cancer-nose  . COPD (chronic obstructive pulmonary  disease) ([place])   . GERD (gastroesophageal reflux disease)   . Hyperlipidemia   . Thyroid disease     hypothyroid    Past Surgical History  Procedure Laterality Date  . Tonsillectomy  09/1972  . Ulnar nerve entrapment  Feb 1995    left elbow  . Carpal tunnel release  07/1993    both hands  . Tubal ligation  Sept 1999  . Hemorroidectomy  July 2003  . Knee arthroplasty  09/2001, 05/2003    left knee, chondromalasia  . Abdominal hysterectomy  May 2004  . Replacement total knee  Nov 2005    Left   . Thyroid lobectomy  01/2005    malignant area removed from right lobe  . Basel cell carcinoma  06/2005, 07/2005    nasal tip and reconstruction  . Knee arthroscopy  jan 2005    Right knee, tisse release, chrondromalacia  . Colonoscopy    . Thyroid surgery      thyroid lobe removal    Social History   Social History  . Marital Status: Married    Spouse Name: [name]  . Number of Children: 1  . Years of Education: some colle   Occupational History  . On disability    Social History Main Topics  . Smoking status: Current Every Day Smoker -- 0.50 packs/day    Types: Cigarettes    Last Attempt to Quit: 02/03/2012  .  Smokeless tobacco: Never Used     Comment: going to try e-sticks to quit  . Alcohol Use: No  . Drug Use: No  . Sexual Activity:    Partners: Male   Other Topics Concern  . Not on file   Social History Narrative   8-10 cups of soda a day.  On disability. No regular exercise.      Family History  Problem Relation Age of Onset  . Depression Mother   . Hypertension Mother   . Hypertension Father   . Heart failure Father   . Diabetes Father   . Heart attack Father   . Colon cancer Neg Hx   . Esophageal cancer Neg Hx   . Stomach cancer Neg Hx   . Rectal cancer Neg Hx     Outpatient Encounter Prescriptions as of 05/26/2015  Medication Sig  . citalopram (CELEXA) 40 MG tablet Take 1 tablet (40 mg total) by mouth at bedtime.  . gabapentin (NEURONTIN) 800 MG  tablet take 1 tablet by mouth four times a day  . levothyroxine (SYNTHROID, LEVOTHROID) 25 MCG tablet Take 1 tablet (25 mcg total) by mouth daily before breakfast. APPOINTMENT NEEDED FOR FUTURE REFILLS  . LINZESS 145 MCG CAPS capsule take 1 capsule by mouth once daily  . morphine (MS CONTIN) 100 MG 12 hr tablet Take 1 tablet (100 mg total) by mouth 2 (two) times daily.  . nortriptyline (PAMELOR) 50 MG capsule Take 2 capsules (100 mg total) by mouth at bedtime.  Marland Kitchen oxyCODONE (ROXICODONE) 15 MG immediate release tablet Take 1 tablet (15 mg total) by mouth every 6 (six) hours as needed.  . pravastatin (PRAVACHOL) 40 MG tablet Take 1 tablet (40 mg total) by mouth daily.  . promethazine (PHENERGAN) 12.5 MG tablet TAKE 1 TABLET BY MOUTH EVERY 12 HOURS  . rizatriptan (MAXALT) 10 MG tablet Take 1 tablet (10 mg total) by mouth as needed. May repeat in 2 hours if needed  . tiZANidine (ZANAFLEX) 2 MG tablet Take 1 tablet (2 mg total) by mouth 3 (three) times daily as needed for muscle spasms.   No facility-administered encounter medications on file as of 05/26/2015.          Objective:   Physical Exam  Constitutional: She is oriented to person, place, and time. She appears well-developed and well-nourished.  HENT:  Head: Normocephalic and atraumatic.  Neck: Neck supple. No thyromegaly present.  Cardiovascular: Normal rate, regular rhythm and normal heart sounds.   Pulmonary/Chest: Effort normal and breath sounds normal.  Lymphadenopathy:    She has no cervical adenopathy.  Neurological: She is alert and oriented to person, place, and time.  Skin: Skin is warm and dry.  She does have some longitudinal nail ridges but no discoloration. Under the right axilla more anteriorly just along the edge of the breast she has a smaller approximately 1 cm seborrheic keratosis. Just posterior to that she has an approximate 2 cm inflamed seborrheic keratosis. And she has a similar one and a half centimeter more  keratotic type lesion near the left axilla just a slightly posterior.  Psychiatric: She has a normal mood and affect. Her behavior is normal.          Assessment & Plan:  Hypothyroidism- due to recheck levels. We have given her a lab slip twice in the last 2 years ago the lab and she did not go but says she will go this morning.  Nail abnormality-we'll check for extra nutrient deficiencies.  Chronic pain-from complex regional pain syndrome as well as degenerative arthritis of the lumbar spine and knees. Currently on chronic pain medication following with pain management clinic and on Linzess for constipation.  Inflamed seborrheic keratoses-they get irritated because the right under both underarms and they get rubbed with any activity. Shave biopsies performed on all 3 lesions. Will call patient with results once available. Follow-up wound care discussed and provided in written form.  Also due for cholesterol screening. Lab slip provided.    Breast cancer screening-mammogram ordered today.  Shave Biopsy Procedure Note  Pre-operative Diagnosis: Seborrheic keratosis  Post-operative Diagnosis: same  Locations: 2 lesion under the right axilla and one under the left.    Indications: irritation under arms.   Anesthesia: Lidocaine 1% with epinephrine without added sodium bicarbonate  Procedure Details  Patient informed of the risks (including bleeding and infection) and benefits of the  procedure and Verbal informed consent obtained.  The lesion and surrounding area were given a sterile prep using betadyne and draped in the usual sterile fashion. A scalpel was used to shave an area of skin approximately 2cm by 2cm.  Hemostasis achieved with monopolar electrodesiccation. Antibiotic ointment and a sterile dressing applied.  The specimen was sent for pathologic examination. The patient tolerated the procedure well.  EBL: 0.5 ml  Findings: Await pathology.    Condition: Stable  Complications: none.  Plan: 1. Instructed to keep the wound dry and covered for 24-48h and clean thereafter. 2. Warning signs of infection were reviewed.   3. Recommended that the patient use OTC acetaminophen as needed for pain.  4. Return PRN.

## 2015-05-26 NOTE — Patient Instructions (Signed)
Keep the areas covered for the next 24 hours. After that okay to get wet in the shower but do not scrub the area. Do not apply any alcohol or peroxide products. Just let the water run over in the shower, pat dry, and place Vaseline over the area. Reapply Vaseline once or twice a day for the next 2 weeks until the area is completely healed.

## 2015-06-02 ENCOUNTER — Encounter: Payer: Self-pay | Admitting: Family Medicine

## 2015-06-02 DIAGNOSIS — E119 Type 2 diabetes mellitus without complications: Secondary | ICD-10-CM | POA: Insufficient documentation

## 2015-06-02 DIAGNOSIS — R7301 Impaired fasting glucose: Secondary | ICD-10-CM | POA: Insufficient documentation

## 2015-06-02 LAB — VITAMIN B6: VITAMIN B6: 5.1 ng/mL (ref 2.1–21.7)

## 2015-06-03 ENCOUNTER — Telehealth: Payer: Self-pay

## 2015-06-03 DIAGNOSIS — E038 Other specified hypothyroidism: Secondary | ICD-10-CM

## 2015-06-03 NOTE — Telephone Encounter (Signed)
The thyroid looked OK so we are not chaning her dose. Ok to refill if she needs refills. Can refill for one year.  [place] order placed.

## 2015-06-04 ENCOUNTER — Ambulatory Visit: Payer: Medicare Other

## 2015-06-04 ENCOUNTER — Other Ambulatory Visit: Payer: Medicare Other

## 2015-06-04 MED ORDER — LEVOTHYROXINE SODIUM 25 MCG PO TABS: 25.0000 ug | ORAL_TABLET | Freq: Every day | ORAL | Status: DC

## 2015-06-04 NOTE — Telephone Encounter (Signed)
Pt notified that dosage would not change.  Medication refilled for 1 year.

## 2015-06-09 ENCOUNTER — Other Ambulatory Visit: Payer: Medicare Other

## 2015-06-09 ENCOUNTER — Ambulatory Visit: Payer: Medicare Other

## 2015-07-06 ENCOUNTER — Other Ambulatory Visit: Payer: Self-pay | Admitting: Family Medicine

## 2015-07-06 NOTE — Telephone Encounter (Signed)
[facility] pharmacy faxed a refill request for patient for Promethazine 12.5, Nortriptyline HCl 50 mg These mediation are not mentioned in your last OV note. They have not been filled in several months, Please advise

## 2015-07-07 ENCOUNTER — Telehealth: Payer: Self-pay | Admitting: Registered Nurse

## 2015-07-07 MED ORDER — PROMETHAZINE HCL 12.5 MG PO TABS: ORAL_TABLET | ORAL | Status: DC

## 2015-07-07 NOTE — Telephone Encounter (Signed)
Spoke with [name], she has a migraine with nausea, phenergan ordered, she verbalizes understanding.

## 2015-07-12 ENCOUNTER — Encounter: Payer: Medicare Other | Attending: Physical Medicine and Rehabilitation | Admitting: Registered Nurse

## 2015-07-12 ENCOUNTER — Encounter: Payer: Self-pay | Admitting: Registered Nurse

## 2015-07-12 VITALS — BP 140/84 | HR 90 | Resp 16

## 2015-07-12 DIAGNOSIS — Z720 Tobacco use: Secondary | ICD-10-CM

## 2015-07-12 DIAGNOSIS — M1711 Unilateral primary osteoarthritis, right knee: Secondary | ICD-10-CM

## 2015-07-12 DIAGNOSIS — G9050 Complex regional pain syndrome I, unspecified: Secondary | ICD-10-CM | POA: Diagnosis not present

## 2015-07-12 DIAGNOSIS — Z760 Encounter for issue of repeat prescription: Secondary | ICD-10-CM | POA: Insufficient documentation

## 2015-07-12 DIAGNOSIS — Z79899 Other long term (current) drug therapy: Secondary | ICD-10-CM | POA: Diagnosis not present

## 2015-07-12 DIAGNOSIS — G894 Chronic pain syndrome: Secondary | ICD-10-CM | POA: Diagnosis not present

## 2015-07-12 DIAGNOSIS — Z5181 Encounter for therapeutic drug level monitoring: Secondary | ICD-10-CM

## 2015-07-12 DIAGNOSIS — M797 Fibromyalgia: Secondary | ICD-10-CM

## 2015-07-12 DIAGNOSIS — M7918 Myalgia, other site: Secondary | ICD-10-CM

## 2015-07-12 DIAGNOSIS — M1712 Unilateral primary osteoarthritis, left knee: Secondary | ICD-10-CM

## 2015-07-12 DIAGNOSIS — M5136 Other intervertebral disc degeneration, lumbar region: Secondary | ICD-10-CM

## 2015-07-12 DIAGNOSIS — M51369 Other intervertebral disc degeneration, lumbar region without mention of lumbar back pain or lower extremity pain: Secondary | ICD-10-CM

## 2015-07-12 MED ORDER — OXYCODONE HCL 15 MG PO TABS: 15.0000 mg | ORAL_TABLET | Freq: Four times a day (QID) | ORAL | Status: DC | PRN

## 2015-07-12 MED ORDER — MORPHINE SULFATE ER 100 MG PO TBCR: 100.0000 mg | EXTENDED_RELEASE_TABLET | Freq: Two times a day (BID) | ORAL | Status: DC

## 2015-07-12 NOTE — Progress Notes (Signed)
Subjective:    Patient ID: [name], female    DOB: 1960/11/08, 55 y.o.   MRN: [id number]  HPI: [name] is a 55 year old female who returns for follow up for chronic pain and medication refill. She states her pain is located in her mid-lower back and bilateral knees right greater than left. She rates her pain 8. Her current exercise regime is performing chair exercises using stretching bands and walking short distances.  We discussed smoking cessation she still smoking on an average 3-4 cigarets a day.   Pain Inventory Average Pain 8 Pain Right Now 8 My pain is constant, sharp, burning, dull, stabbing, tingling and aching  In the last 24 hours, has pain interfered with the following? General activity 10 Relation with others 10 Enjoyment of life 10 What TIME of day is your pain at its worst? NA Sleep (in general) Poor  Pain is worse with: walking, bending, standing and some activites Pain improves with: rest, heat/ice, therapy/exercise, medication, TENS and injections Relief from Meds: 5  Mobility use a cane how many minutes can you walk? 5-7 ability to climb steps?  yes do you drive?  yes Do you have any goals in this area?  yes  Function not employed: date last employed NA disabled: date disabled NA I need assistance with the following:  bathing, meal prep, household duties and shopping Do you have any goals in this area?  yes  Neuro/Psych weakness numbness tremor tingling trouble walking spasms confusion depression anxiety  Prior Studies Any changes since last visit?  no bone scan x-rays CT/MRI  Physicians involved in your care Any changes since last visit?  no Primary care NA   Family History  Problem Relation Age of Onset  . Depression Mother   . Hypertension Mother   . Hypertension Father   . Heart failure Father   . Diabetes Father   . Heart attack Father   . Colon cancer Neg Hx   . Esophageal cancer Neg Hx   .  Stomach cancer Neg Hx   . Rectal cancer Neg Hx    Social History   Social History  . Marital Status: Married    Spouse Name: [name]  . Number of Children: 1  . Years of Education: some colle   Occupational History  . On disability    Social History Main Topics  . Smoking status: Current Every Day Smoker -- 0.50 packs/day    Types: Cigarettes    Last Attempt to Quit: 02/03/2012  . Smokeless tobacco: Never Used     Comment: going to try e-sticks to quit  . Alcohol Use: No  . Drug Use: No  . Sexual Activity:    Partners: Male   Other Topics Concern  . None   Social History Narrative   8-10 cups of soda a day.  On disability. No regular exercise.     Past Surgical History  Procedure Laterality Date  . Tonsillectomy  09/1972  . Ulnar nerve entrapment  Feb 1995    left elbow  . Carpal tunnel release  07/1993    both hands  . Tubal ligation  Sept 1999  . Hemorroidectomy  July 2003  . Knee arthroplasty  09/2001, 05/2003    left knee, chondromalasia  . Abdominal hysterectomy  May 2004  . Replacement total knee  Nov 2005    Left   . Thyroid lobectomy  01/2005    malignant area removed from right lobe  .  Basel cell carcinoma  06/2005, 07/2005    nasal tip and reconstruction  . Knee arthroscopy  jan 2005    Right knee, tisse release, chrondromalacia  . Colonoscopy    . Thyroid surgery      thyroid lobe removal   Past Medical History  Diagnosis Date  . Chronic pain syndrome   . Intervertebral lumbar disc disorder with myelopathy, lumbar region   . Facet syndrome, lumbar   . Primary localized osteoarthrosis, lower leg   . Degeneration of lumbar or lumbosacral intervertebral disc   . Unspecified musculoskeletal disorders and symptoms referable to neck     cervical/trapezius  . Migraine without aura, with intractable migraine, so stated, without mention of status migrainosus   . Depression   . Lumbosacral spondylosis without myelopathy   . Allergy   . Anxiety   .  Cancer ([place])     cancerous nodules on thyroid  . Cancer ([place])     basal cell Cancer-nose  . COPD (chronic obstructive pulmonary disease) ([place])   . GERD (gastroesophageal reflux disease)   . Hyperlipidemia   . Thyroid disease     hypothyroid   BP 140/84 mmHg  Pulse 90  Resp 16  SpO2 94%  Opioid Risk Score:   Fall Risk Score:  `1  Depression screen PHQ 2/9  Depression screen [facility] 2/9 03/16/2015 09/17/2014 04/21/2014  Decreased Interest 3 3 3   Down, Depressed, Hopeless 3 3 -  PHQ - 2 Score 6 6 3   Altered sleeping - - 3  Tired, decreased energy - - 3  Change in appetite - - 3  Feeling bad or failure about yourself  - - 2  Trouble concentrating - - 2  Moving slowly or fidgety/restless - - 1  Suicidal thoughts - - 1  PHQ-9 Score - - 18      Review of Systems  Constitutional: Positive for appetite change.  Cardiovascular:       Limb swelling   Gastrointestinal: Positive for nausea and vomiting.  Musculoskeletal: Positive for gait problem.  Neurological: Positive for tremors, weakness and numbness.       Tingling Spasms   Psychiatric/Behavioral: Positive for confusion and dysphoric mood. The patient is nervous/anxious.   All other systems reviewed and are negative.      Objective:   Physical Exam  Constitutional: She is oriented to person, place, and time. She appears well-developed and well-nourished.  HENT:  Head: Normocephalic and atraumatic.  Neck: Normal range of motion. Neck supple.  Cardiovascular: Normal rate and regular rhythm.   Pulmonary/Chest: Effort normal and breath sounds normal.  Musculoskeletal:  Normal Muscle Bulk and Muscle Testing Reveals: Upper Extremities: Full ROM and Muscle Strength 5/5 Thoracic and Lumbar Hypersensitivity Lower Extremities: Left: Full ROM and Muscle Strength 5/5 Right: Decreased ROM and Muscle Strength 4/5 Right Lower Extremity Flexion Produces pain into Patella Arises from chair slowly using 3 prong cane for  support Antalgic Gait   Neurological: She is alert and oriented to person, place, and time.  Skin: Skin is warm and dry.  Psychiatric: She has a normal mood and affect.  Nursing note and vitals reviewed.         Assessment & Plan:  1.Chronic Low Back Pain: Refilled: Oxycodone 15 mg one tablet every 6 hours as needed #120 and MS Contin 100 mg one tablet every 12 hours #60. Second script given for the following month. We will continue the opioid monitoring program, this consists of regular clinic visits, examinations, urine  drug screen, pill counts as well as use of [place] Controlled Substance Reporting System. 2. Degenerative Disk Disease:Continue exercise, and heat therapy. Continue Current Medication Regime.  3. Osteoarthritis of Bilateral Knees: Continue exercise and heat therapy.  4. Migraine Headaches: On Maxalt.  5. Smoking Cessation: Encouraged to Continue with smoking cessation 6. Constipation: Continue Linzess  20 minutes of face to face patient care time was spent during this visit. All questions were encouraged and answered.   F/U in 2 months.

## 2015-07-17 LAB — TOXASSURE SELECT,+ANTIDEPR,UR: PDF: 0

## 2015-07-17 LAB — 6-ACETYLMORPHINE,TOXASSURE ADD
6-ACETYLMORPHINE: NEGATIVE
6-ACETYLMORPHINE: NOT DETECTED ng/mg{creat}

## 2015-07-28 ENCOUNTER — Other Ambulatory Visit: Payer: Self-pay | Admitting: *Deleted

## 2015-07-28 DIAGNOSIS — M7918 Myalgia, other site: Secondary | ICD-10-CM

## 2015-07-28 DIAGNOSIS — M47816 Spondylosis without myelopathy or radiculopathy, lumbar region: Secondary | ICD-10-CM

## 2015-07-28 DIAGNOSIS — G9050 Complex regional pain syndrome I, unspecified: Secondary | ICD-10-CM

## 2015-07-28 MED ORDER — NORTRIPTYLINE HCL 50 MG PO CAPS: 100.0000 mg | ORAL_CAPSULE | Freq: Every day | ORAL | Status: DC

## 2015-08-06 DIAGNOSIS — H5213 Myopia, bilateral: Secondary | ICD-10-CM | POA: Diagnosis not present

## 2015-09-06 ENCOUNTER — Encounter: Payer: Self-pay | Admitting: Registered Nurse

## 2015-09-06 ENCOUNTER — Other Ambulatory Visit: Payer: Self-pay

## 2015-09-06 ENCOUNTER — Encounter: Payer: Medicare Other | Attending: Physical Medicine and Rehabilitation | Admitting: Registered Nurse

## 2015-09-06 VITALS — BP 118/71 | HR 106 | Temp 99.3°F

## 2015-09-06 DIAGNOSIS — M797 Fibromyalgia: Secondary | ICD-10-CM

## 2015-09-06 DIAGNOSIS — M1712 Unilateral primary osteoarthritis, left knee: Secondary | ICD-10-CM | POA: Diagnosis not present

## 2015-09-06 DIAGNOSIS — Z5181 Encounter for therapeutic drug level monitoring: Secondary | ICD-10-CM

## 2015-09-06 DIAGNOSIS — G9050 Complex regional pain syndrome I, unspecified: Secondary | ICD-10-CM | POA: Diagnosis not present

## 2015-09-06 DIAGNOSIS — Z760 Encounter for issue of repeat prescription: Secondary | ICD-10-CM | POA: Insufficient documentation

## 2015-09-06 DIAGNOSIS — M7918 Myalgia, other site: Secondary | ICD-10-CM

## 2015-09-06 DIAGNOSIS — G894 Chronic pain syndrome: Secondary | ICD-10-CM

## 2015-09-06 DIAGNOSIS — Z79899 Other long term (current) drug therapy: Secondary | ICD-10-CM

## 2015-09-06 DIAGNOSIS — M1711 Unilateral primary osteoarthritis, right knee: Secondary | ICD-10-CM

## 2015-09-06 MED ORDER — MORPHINE SULFATE ER 100 MG PO TBCR: 100.0000 mg | EXTENDED_RELEASE_TABLET | Freq: Two times a day (BID) | ORAL | 0 refills | Status: DC

## 2015-09-06 MED ORDER — LINACLOTIDE 145 MCG PO CAPS: 145.0000 ug | ORAL_CAPSULE | Freq: Every day | ORAL | 5 refills | Status: DC

## 2015-09-06 MED ORDER — OXYCODONE HCL 15 MG PO TABS: 15.0000 mg | ORAL_TABLET | Freq: Four times a day (QID) | ORAL | 0 refills | Status: DC | PRN

## 2015-09-06 NOTE — Telephone Encounter (Signed)
Received a fax for Linzess refill. Refill sent.

## 2015-09-06 NOTE — Progress Notes (Signed)
Subjective:    Patient ID: [name], female    DOB: [birth year], 55 y.o.   MRN: [id number]  HPI: [name] is a 55 year old female who returns for follow up for chronic pain and medication refill. She states her pain is located in her lower back and right knee. Refuses X-rays at this time, would like to wait until she see's Dr. Naaman Plummer. She rates her pain 7. Her current exercise regime is performing chair exercises every other day, using stretching bands and walking short distances.  We discussed smoking cessation she still smoking on an average 3-4 cigarets a day.   Pain Inventory Average Pain 8 Pain Right Now 7 My pain is constant, sharp, burning, dull, stabbing, tingling and aching  In the last 24 hours, has pain interfered with the following? General activity 10 Relation with others 10 Enjoyment of life 10 What TIME of day is your pain at its worst? all Sleep (in general) Poor  Pain is worse with: walking, bending, standing and some activites Pain improves with: rest, heat/ice, therapy/exercise, medication, TENS and injections Relief from Meds: na  Mobility use a cane ability to climb steps?  yes do you drive?  yes  Function disabled: date disabled . I need assistance with the following:  bathing, meal prep, household duties and shopping  Neuro/Psych weakness numbness tremor tingling spasms dizziness confusion depression anxiety  Prior Studies Any changes since last visit?  no  Physicians involved in your care Any changes since last visit?  no   Family History  Problem Relation Age of Onset  . Depression Mother   . Hypertension Mother   . Hypertension Father   . Heart failure Father   . Diabetes Father   . Heart attack Father   . Colon cancer Neg Hx   . Esophageal cancer Neg Hx   . Stomach cancer Neg Hx   . Rectal cancer Neg Hx    Social History   Social History  . Marital status: Married    Spouse name: [name]  .  Number of children: 1  . Years of education: some colle   Occupational History  . On disability Unemployed   Social History Main Topics  . Smoking status: Current Every Day Smoker    Packs/day: 0.50    Types: Cigarettes    Last attempt to quit: 02/03/2012  . Smokeless tobacco: Never Used     Comment: going to try e-sticks to quit  . Alcohol use No  . Drug use: No  . Sexual activity: Not Currently    Partners: Male   Other Topics Concern  . None   Social History Narrative   8-10 cups of soda a day.  On disability. No regular exercise.     Past Surgical History:  Procedure Laterality Date  . ABDOMINAL HYSTERECTOMY  May 2004  . Basel Cell Carcinoma  06/2005, 07/2005   nasal tip and reconstruction  . CARPAL TUNNEL RELEASE  07/1993   both hands  . COLONOSCOPY    . HEMORROIDECTOMY  July 2003  . KNEE ARTHROPLASTY  09/2001, 05/2003   left knee, chondromalasia  . KNEE ARTHROSCOPY  jan 2005   Right knee, tisse release, chrondromalacia  . REPLACEMENT TOTAL KNEE  Nov 2005   Left   . THYROID LOBECTOMY  01/2005   malignant area removed from right lobe  . THYROID SURGERY     thyroid lobe removal  . TONSILLECTOMY  09/1972  . TUBAL LIGATION  Sept 1999  . Ulnar nerve entrapment  Feb 1995   left elbow   Past Medical History:  Diagnosis Date  . Allergy   . Anxiety   . Cancer ([place])    cancerous nodules on thyroid  . Cancer ([place])    basal cell Cancer-nose  . Chronic pain syndrome   . COPD (chronic obstructive pulmonary disease) ([place])   . Degeneration of lumbar or lumbosacral intervertebral disc   . Depression   . Facet syndrome, lumbar   . GERD (gastroesophageal reflux disease)   . Hyperlipidemia   . Intervertebral lumbar disc disorder with myelopathy, lumbar region   . Lumbosacral spondylosis without myelopathy   . Migraine without aura, with intractable migraine, so stated, without mention of status migrainosus   . Primary localized osteoarthrosis, lower leg   . Thyroid  disease    hypothyroid  . Unspecified musculoskeletal disorders and symptoms referable to neck    cervical/trapezius   BP 118/71 (BP Location: Left Arm, Patient Position: Sitting, Cuff Size: Large)   Pulse (!) 106   Temp 99.3 F (37.4 C) (Oral)   SpO2 93%   Opioid Risk Score:   Fall Risk Score:  `1  Depression screen PHQ 2/9  Depression screen [facility] 2/9 09/06/2015 03/16/2015 09/17/2014 04/21/2014  Decreased Interest 3 3 3 3   Down, Depressed, Hopeless - 3 3 -  PHQ - 2 Score 3 6 6 3   Altered sleeping - - - 3  Tired, decreased energy - - - 3  Change in appetite - - - 3  Feeling bad or failure about yourself  - - - 2  Trouble concentrating - - - 2  Moving slowly or fidgety/restless - - - 1  Suicidal thoughts - - - 1  PHQ-9 Score - - - 18    Review of Systems  Psychiatric/Behavioral:       Panic attacks  All other systems reviewed and are negative.      Objective:   Physical Exam  Constitutional: She is oriented to person, place, and time. She appears well-developed and well-nourished.  HENT:  Head: Normocephalic and atraumatic.  Neck: Normal range of motion. Neck supple.  Cardiovascular: Normal rate and regular rhythm.   Pulmonary/Chest: Effort normal and breath sounds normal.  Musculoskeletal: She exhibits edema.  Normal Muscle Bulk and Muscle Testing Reveals: Upper Extremities: Full ROM and Muscle Strength 5/5 Thoracic and Lumbar Hypersensitivity Lower Extremities: Full ROM and Muscle Strength 5/5 Right Lower Extremity Flexion Produces pain into the Patella Arises from Table slowly using 3 prong cane for support Antalgic Gait  Neurological: She is alert and oriented to person, place, and time.  Skin: Skin is warm and dry.  Psychiatric: She has a normal mood and affect.  Nursing note and vitals reviewed.         Assessment & Plan:  1.Chronic Low Back Pain: Refilled: Oxycodone 15 mg one tablet every 6 hours as needed #120 and MS Contin 100 mg one tablet every 12  hours #60. Second script given for the following month. Continue Gabapentin and Pamelor We will continue the opioid monitoring program, this consists of regular clinic visits, examinations, urine drug screen, pill counts as well as use of [place] Controlled Substance Reporting System. 2. Degenerative Disk Disease:Continue exercise, and heat therapy. Continue Current Medication Regime.  3. Osteoarthritis of Bilateral Knees: Continue exercise and heat therapy.  4. Migraine Headaches: On Maxalt.  5. Smoking Cessation: Encouraged to Continue with smoking cessation 6. Constipation: Continue Linzess  7. Muscle Spasm: Continue Tizanidine  20 minutes of face to face patient care time was spent during this visit. All questions were encouraged and answered.   F/U in 2 months.

## 2015-09-10 ENCOUNTER — Ambulatory Visit: Payer: Medicare Other | Admitting: Registered Nurse

## 2015-09-13 ENCOUNTER — Other Ambulatory Visit: Payer: Self-pay | Admitting: Physical Medicine & Rehabilitation

## 2015-09-25 ENCOUNTER — Other Ambulatory Visit: Payer: Self-pay | Admitting: Family Medicine

## 2015-09-30 ENCOUNTER — Other Ambulatory Visit: Payer: Self-pay | Admitting: Physical Medicine & Rehabilitation

## 2015-10-21 ENCOUNTER — Other Ambulatory Visit: Payer: Self-pay | Admitting: Physical Medicine & Rehabilitation

## 2015-11-04 ENCOUNTER — Other Ambulatory Visit: Payer: Self-pay | Admitting: Physical Medicine & Rehabilitation

## 2015-11-04 DIAGNOSIS — G9050 Complex regional pain syndrome I, unspecified: Secondary | ICD-10-CM

## 2015-11-04 DIAGNOSIS — M7918 Myalgia, other site: Secondary | ICD-10-CM

## 2015-11-04 DIAGNOSIS — M47816 Spondylosis without myelopathy or radiculopathy, lumbar region: Secondary | ICD-10-CM

## 2015-11-10 ENCOUNTER — Encounter: Payer: Medicare Other | Attending: Physical Medicine and Rehabilitation | Admitting: Physical Medicine & Rehabilitation

## 2015-11-10 ENCOUNTER — Encounter: Payer: Self-pay | Admitting: Physical Medicine & Rehabilitation

## 2015-11-10 DIAGNOSIS — M7918 Myalgia, other site: Secondary | ICD-10-CM

## 2015-11-10 DIAGNOSIS — Z79899 Other long term (current) drug therapy: Secondary | ICD-10-CM | POA: Diagnosis not present

## 2015-11-10 DIAGNOSIS — G9050 Complex regional pain syndrome I, unspecified: Secondary | ICD-10-CM

## 2015-11-10 DIAGNOSIS — Z760 Encounter for issue of repeat prescription: Secondary | ICD-10-CM | POA: Insufficient documentation

## 2015-11-10 DIAGNOSIS — M791 Myalgia: Secondary | ICD-10-CM

## 2015-11-10 DIAGNOSIS — Z5181 Encounter for therapeutic drug level monitoring: Secondary | ICD-10-CM | POA: Diagnosis not present

## 2015-11-10 DIAGNOSIS — G894 Chronic pain syndrome: Secondary | ICD-10-CM

## 2015-11-10 MED ORDER — MORPHINE SULFATE ER 100 MG PO TBCR: 100.0000 mg | EXTENDED_RELEASE_TABLET | Freq: Two times a day (BID) | ORAL | 0 refills | Status: DC

## 2015-11-10 MED ORDER — OXYCODONE HCL 15 MG PO TABS: 15.0000 mg | ORAL_TABLET | Freq: Four times a day (QID) | ORAL | 0 refills | Status: DC | PRN

## 2015-11-10 NOTE — Addendum Note (Signed)
Addended by: Caro Hight on: 11/10/2015 10:16 AM   Modules accepted: Orders

## 2015-11-10 NOTE — Patient Instructions (Signed)
PLEASE CALL ME WITH ANY PROBLEMS OR QUESTIONS ([phone])  

## 2015-11-10 NOTE — Progress Notes (Signed)
Subjective:    Patient ID: ZITLALLY OUTLER, female    DOB: 03-Jan-1961, 55 y.o.   MRN: [id number]  HPI   [name] is here in follow up of her chronic pain. Things have been fairly stable from a standpoint of pain except for her right knee which has been bothering her more because she's favoring it more for so long. It swells at times.   She maintains on her medications as prescribed. She has been compliant with our program.   She's working on he smoking cessation and continues with her HEP as she can.   Pain Inventory Average Pain 8 Pain Right Now 8 My pain is constant, sharp, burning, dull, stabbing, tingling and aching  In the last 24 hours, has pain interfered with the following? General activity 10 Relation with others 10 Enjoyment of life 10 What TIME of day is your pain at its worst? all Sleep (in general) Poor  Pain is worse with: walking, bending, standing and some activites Pain improves with: rest, heat/ice, therapy/exercise, pacing activities, medication, TENS and injections Relief from Meds: 4  Mobility use a cane how many minutes can you walk? 5-7 ability to climb steps?  yes do you drive?  yes  Function disabled: date disabled . I need assistance with the following:  bathing, meal prep, household duties and shopping  Neuro/Psych bladder control problems weakness numbness tremor tingling trouble walking spasms dizziness confusion depression anxiety  Prior Studies Any changes since last visit?  no  Physicians involved in your care Any changes since last visit?  no   Family History  Problem Relation Age of Onset  . Depression Mother   . Hypertension Mother   . Hypertension Father   . Heart failure Father   . Diabetes Father   . Heart attack Father   . Colon cancer Neg Hx   . Esophageal cancer Neg Hx   . Stomach cancer Neg Hx   . Rectal cancer Neg Hx    Social History   Social History  . Marital status: Married    Spouse  name: [name]  . Number of children: 1  . Years of education: some colle   Occupational History  . On disability Unemployed   Social History Main Topics  . Smoking status: Current Every Day Smoker    Packs/day: 0.50    Types: Cigarettes    Last attempt to quit: 02/03/2012  . Smokeless tobacco: Never Used     Comment: going to try e-sticks to quit  . Alcohol use No  . Drug use: No  . Sexual activity: Not Currently    Partners: Male   Other Topics Concern  . None   Social History Narrative   8-10 cups of soda a day.  On disability. No regular exercise.     Past Surgical History:  Procedure Laterality Date  . ABDOMINAL HYSTERECTOMY  May 2004  . Basel Cell Carcinoma  06/2005, 07/2005   nasal tip and reconstruction  . CARPAL TUNNEL RELEASE  07/1993   both hands  . COLONOSCOPY    . HEMORROIDECTOMY  July 2003  . KNEE ARTHROPLASTY  09/2001, 05/2003   left knee, chondromalasia  . KNEE ARTHROSCOPY  jan 2005   Right knee, tisse release, chrondromalacia  . REPLACEMENT TOTAL KNEE  Nov 2005   Left   . THYROID LOBECTOMY  01/2005   malignant area removed from right lobe  . THYROID SURGERY     thyroid lobe removal  . TONSILLECTOMY  09/1972  . TUBAL LIGATION  Sept 1999  . Ulnar nerve entrapment  Feb 1995   left elbow   Past Medical History:  Diagnosis Date  . Allergy   . Anxiety   . Cancer ([place])    cancerous nodules on thyroid  . Cancer ([place])    basal cell Cancer-nose  . Chronic pain syndrome   . COPD (chronic obstructive pulmonary disease) ([place])   . Degeneration of lumbar or lumbosacral intervertebral disc   . Depression   . Facet syndrome, lumbar   . GERD (gastroesophageal reflux disease)   . Hyperlipidemia   . Intervertebral lumbar disc disorder with myelopathy, lumbar region   . Lumbosacral spondylosis without myelopathy   . Migraine without aura, with intractable migraine, so stated, without mention of status migrainosus   . Primary localized osteoarthrosis, lower leg    . Thyroid disease    hypothyroid  . Unspecified musculoskeletal disorders and symptoms referable to neck    cervical/trapezius   BP 132/78   Pulse (!) 106   Resp 16   SpO2 95%   Opioid Risk Score:   Fall Risk Score:  `1  Depression screen PHQ 2/9  Depression screen [facility] 2/9 11/10/2015 09/06/2015 03/16/2015 09/17/2014 04/21/2014  Decreased Interest 3 3 3 3 3   Down, Depressed, Hopeless 3 - 3 3 -  PHQ - 2 Score 6 3 6 6 3   Altered sleeping - - - - 3  Tired, decreased energy - - - - 3  Change in appetite - - - - 3  Feeling bad or failure about yourself  - - - - 2  Trouble concentrating - - - - 2  Moving slowly or fidgety/restless - - - - 1  Suicidal thoughts - - - - 1  PHQ-9 Score - - - - 18    Review of Systems  Constitutional: Positive for appetite change, diaphoresis and unexpected weight change.  Cardiovascular: Positive for leg swelling.  Gastrointestinal: Positive for nausea and vomiting.  Skin: Positive for rash.  All other systems reviewed and are negative.      Objective:   Physical Exam  Constitutional: She is oriented to person, place, and time. She appears well-developed and well-nourished.  HENT:  Head: Normocephalic and atraumatic.  Neck: Normal range of motion. Neck supple.  Cardiovascular: Normal rate and regular rhythm.   Pulmonary/Chest: Effort normal and breath sounds normal.  Musculoskeletal: She exhibits edema.  Normal Muscle Bulk and Muscle Testing Reveals: Upper Extremities: Full ROM and Muscle Strength 5/5 Thoracic and Lumbar Hypersensitivity Lower Extremities: Full ROM and Muscle Strength 5/5 Right Lower Extremity Flexion Produces pain into the Patella Pronged gait for support. Antalgic Gait bilaterally needs extra time rising from the chair Right knee with joint effusion. Medial and lateral joint line pain Neurological: She is alert and oriented to person, place, and time.  Skin: Skin is warm and dry.  Psychiatric: She has a normal mood and  affect.  Nursing note and vitals reviewed.         Assessment & Plan:  1.Chronic Low Back Pain: Refilled: Oxycodone 15 mg one tablet every 6 hours as needed #120 and MS Contin 100 mg one tablet every 12 hours #60. Second script given for the following month. Continue Gabapentin and Pamelor   -continue the opioid monitoring program, this consists of regular clinic visits, examinations, urine drug screen, pill counts as well as use of [place] Controlled Substance Reporting System. 2. Degenerative Disk Disease:Continue exercise, and heat therapy.  Continue Current Medication Regime.  3. Osteoarthritis of Bilateral Knees: Continue exercise and HEP  -will refer to [facility] for OA knee brace lateral supports  -may need to follow up with ortho also 4. Migraine Headaches: On Maxalt.  5. Smoking Cessation: Encouraged to Continue with smoking cessation 6. Constipation: Continue Linzess 7. Muscle Spasm: Continue Tizanidine  15 minutes of face to face patient care time was spent during this visit. All questions were encouraged and answered.

## 2015-11-18 LAB — TOXASSURE SELECT,+ANTIDEPR,UR

## 2015-11-18 LAB — 6-ACETYLMORPHINE,TOXASSURE ADD
6-ACETYLMORPHINE: NEGATIVE
6-ACETYLMORPHINE: NOT DETECTED ng/mg{creat}

## 2015-11-22 NOTE — Progress Notes (Signed)
Urine drug screen for this encounter is consistent for prescribed medication 

## 2015-12-01 ENCOUNTER — Other Ambulatory Visit: Payer: Self-pay | Admitting: *Deleted

## 2015-12-01 MED ORDER — PROMETHAZINE HCL 12.5 MG PO TABS: ORAL_TABLET | ORAL | 2 refills | Status: DC

## 2015-12-04 ENCOUNTER — Other Ambulatory Visit: Payer: Self-pay | Admitting: Physical Medicine & Rehabilitation

## 2015-12-04 DIAGNOSIS — G9050 Complex regional pain syndrome I, unspecified: Secondary | ICD-10-CM

## 2015-12-04 DIAGNOSIS — M7918 Myalgia, other site: Secondary | ICD-10-CM

## 2015-12-04 DIAGNOSIS — M47816 Spondylosis without myelopathy or radiculopathy, lumbar region: Secondary | ICD-10-CM

## 2015-12-06 MED ORDER — NORTRIPTYLINE HCL 50 MG PO CAPS: 100.0000 mg | ORAL_CAPSULE | Freq: Every day | ORAL | 2 refills | Status: DC

## 2016-01-05 ENCOUNTER — Other Ambulatory Visit: Payer: Self-pay

## 2016-01-05 ENCOUNTER — Telehealth: Payer: Self-pay

## 2016-01-05 MED ORDER — GABAPENTIN 800 MG PO TABS: 800.0000 mg | ORAL_TABLET | Freq: Four times a day (QID) | ORAL | 1 refills | Status: DC

## 2016-01-10 ENCOUNTER — Encounter: Payer: Self-pay | Admitting: Registered Nurse

## 2016-01-10 ENCOUNTER — Encounter: Payer: Medicare Other | Attending: Physical Medicine and Rehabilitation | Admitting: Registered Nurse

## 2016-01-10 VITALS — BP 112/80 | HR 95

## 2016-01-10 DIAGNOSIS — G9050 Complex regional pain syndrome I, unspecified: Secondary | ICD-10-CM

## 2016-01-10 DIAGNOSIS — M47816 Spondylosis without myelopathy or radiculopathy, lumbar region: Secondary | ICD-10-CM

## 2016-01-10 DIAGNOSIS — M1712 Unilateral primary osteoarthritis, left knee: Secondary | ICD-10-CM | POA: Diagnosis not present

## 2016-01-10 DIAGNOSIS — Z79899 Other long term (current) drug therapy: Secondary | ICD-10-CM

## 2016-01-10 DIAGNOSIS — M1288 Other specific arthropathies, not elsewhere classified, other specified site: Secondary | ICD-10-CM

## 2016-01-10 DIAGNOSIS — M7061 Trochanteric bursitis, right hip: Secondary | ICD-10-CM

## 2016-01-10 DIAGNOSIS — M791 Myalgia: Secondary | ICD-10-CM

## 2016-01-10 DIAGNOSIS — M1711 Unilateral primary osteoarthritis, right knee: Secondary | ICD-10-CM | POA: Diagnosis not present

## 2016-01-10 DIAGNOSIS — Z760 Encounter for issue of repeat prescription: Secondary | ICD-10-CM | POA: Diagnosis not present

## 2016-01-10 DIAGNOSIS — M7918 Myalgia, other site: Secondary | ICD-10-CM

## 2016-01-10 DIAGNOSIS — Z720 Tobacco use: Secondary | ICD-10-CM

## 2016-01-10 DIAGNOSIS — G894 Chronic pain syndrome: Secondary | ICD-10-CM

## 2016-01-10 DIAGNOSIS — M7062 Trochanteric bursitis, left hip: Secondary | ICD-10-CM

## 2016-01-10 DIAGNOSIS — Z5181 Encounter for therapeutic drug level monitoring: Secondary | ICD-10-CM

## 2016-01-10 MED ORDER — MORPHINE SULFATE ER 100 MG PO TBCR: 100.0000 mg | EXTENDED_RELEASE_TABLET | Freq: Two times a day (BID) | ORAL | 0 refills | Status: DC

## 2016-01-10 MED ORDER — OXYCODONE HCL 15 MG PO TABS: 15.0000 mg | ORAL_TABLET | Freq: Four times a day (QID) | ORAL | 0 refills | Status: DC | PRN

## 2016-01-10 NOTE — Progress Notes (Signed)
Subjective:    Patient ID: [name], female    DOB: 1960-07-11, 55 y.o.   MRN: [id number]  HPI: [name] is a 55 year old female who returns for follow up for chronic pain and medication refill. She states her pain is located in her left shoulder, lower back, bilateral hips and bilateral knees  R>L. She rates her pain 7. Her current exercise regime is performing chair exercises every other day, using stretching bands and walking short distances with her cane. Also states she woke up with a migraine this morning she has taken her Maxalt this morning.   We discussed smoking cessation she's still smoking on an average 3-5 cigarets a day. Will continue to stop smoking she states.   Pain Inventory Average Pain 8 Pain Right Now 7 My pain is constant, sharp, burning, dull, stabbing, tingling and aching  In the last 24 hours, has pain interfered with the following? General activity 9 Relation with others 10 Enjoyment of life 10 What TIME of day is your pain at its worst? all Sleep (in general) Poor  Pain is worse with: walking, bending, standing and some activites Pain improves with: rest, heat/ice, medication, TENS and injections Relief from Meds: 4  Mobility use a cane ability to climb steps?  yes do you drive?  yes Do you have any goals in this area?  yes  Function disabled: date disabled 2007 I need assistance with the following:  bathing, meal prep, household duties and shopping Do you have any goals in this area?  yes  Neuro/Psych bladder control problems weakness numbness tremor trouble walking spasms dizziness confusion depression anxiety  Prior Studies Any changes since last visit?  no  Physicians involved in your care Any changes since last visit?  no   Family History  Problem Relation Age of Onset  . Depression Mother   . Hypertension Mother   . Hypertension Father   . Heart failure Father   . Diabetes Father   . Heart  attack Father   . Colon cancer Neg Hx   . Esophageal cancer Neg Hx   . Stomach cancer Neg Hx   . Rectal cancer Neg Hx    Social History   Social History  . Marital status: Married    Spouse name: [name]  . Number of children: 1  . Years of education: some colle   Occupational History  . On disability Unemployed   Social History Main Topics  . Smoking status: Current Every Day Smoker    Packs/day: 0.50    Types: Cigarettes    Last attempt to quit: 02/03/2012  . Smokeless tobacco: Never Used     Comment: going to try e-sticks to quit  . Alcohol use No  . Drug use: No  . Sexual activity: Not Currently    Partners: Male   Other Topics Concern  . Not on file   Social History Narrative   8-10 cups of soda a day.  On disability. No regular exercise.     Past Surgical History:  Procedure Laterality Date  . ABDOMINAL HYSTERECTOMY  May 2004  . Basel Cell Carcinoma  06/2005, 07/2005   nasal tip and reconstruction  . CARPAL TUNNEL RELEASE  07/1993   both hands  . COLONOSCOPY    . HEMORROIDECTOMY  July 2003  . KNEE ARTHROPLASTY  09/2001, 05/2003   left knee, chondromalasia  . KNEE ARTHROSCOPY  jan 2005   Right knee, tisse release, chrondromalacia  .  REPLACEMENT TOTAL KNEE  Nov 2005   Left   . THYROID LOBECTOMY  01/2005   malignant area removed from right lobe  . THYROID SURGERY     thyroid lobe removal  . TONSILLECTOMY  09/1972  . TUBAL LIGATION  Sept 1999  . Ulnar nerve entrapment  Feb 1995   left elbow   Past Medical History:  Diagnosis Date  . Allergy   . Anxiety   . Cancer ([place])    cancerous nodules on thyroid  . Cancer ([place])    basal cell Cancer-nose  . Chronic pain syndrome   . COPD (chronic obstructive pulmonary disease) ([place])   . Degeneration of lumbar or lumbosacral intervertebral disc   . Depression   . Facet syndrome, lumbar   . GERD (gastroesophageal reflux disease)   . Hyperlipidemia   . Intervertebral lumbar disc disorder with myelopathy, lumbar  region   . Lumbosacral spondylosis without myelopathy   . Migraine without aura, with intractable migraine, so stated, without mention of status migrainosus   . Primary localized osteoarthrosis, lower leg   . Thyroid disease    hypothyroid  . Unspecified musculoskeletal disorders and symptoms referable to neck    cervical/trapezius   There were no vitals taken for this visit.  Opioid Risk Score:   Fall Risk Score:  `1  Depression screen PHQ 2/9  Depression screen [facility] 2/9 11/10/2015 09/06/2015 03/16/2015 09/17/2014 04/21/2014  Decreased Interest 3 3 3 3 3   Down, Depressed, Hopeless 3 - 3 3 -  PHQ - 2 Score 6 3 6 6 3   Altered sleeping - - - - 3  Tired, decreased energy - - - - 3  Change in appetite - - - - 3  Feeling bad or failure about yourself  - - - - 2  Trouble concentrating - - - - 2  Moving slowly or fidgety/restless - - - - 1  Suicidal thoughts - - - - 1  PHQ-9 Score - - - - 18   Review of Systems  HENT: Negative.   Eyes: Negative.   Respiratory: Negative.   Cardiovascular: Negative.   Gastrointestinal: Negative.   Endocrine: Negative.   Genitourinary: Positive for difficulty urinating.  Musculoskeletal: Positive for gait problem.       Spasms  Skin: Negative.   Allergic/Immunologic: Negative.   Neurological: Positive for dizziness, tremors, weakness and numbness.  Hematological: Negative.   Psychiatric/Behavioral: Positive for confusion and dysphoric mood. The patient is nervous/anxious.   All other systems reviewed and are negative.      Objective:   Physical Exam  Constitutional: She is oriented to person, place, and time. She appears well-developed and well-nourished.  HENT:  Head: Normocephalic and atraumatic.  Neck: Normal range of motion. Neck supple.  Cardiovascular: Normal rate and regular rhythm.   Pulmonary/Chest: Effort normal and breath sounds normal.  Musculoskeletal: She exhibits edema.  Normal Muscle Bulk and Muscle Testing Reveals: Upper  Extremities: Full ROM and Muscle Strength 5/5 Bilateral AC Joint Tenderness T-1- T-3 Paraspinal Tenderness Lumbar Paraspinal Tenderness nL-3-L-5 Bilateral Greater Trochanteric Tenderness Lower Extremities: Decreased ROM and Muscle Strength 4/5 Right Lower Extremity Flexion Produces Pain into Patella Arises from Table slowly Antalgic Gait    Neurological: She is alert and oriented to person, place, and time.  Skin: Skin is warm and dry.  Psychiatric: She has a normal mood and affect.  Nursing note and vitals reviewed.         Assessment & Plan:  1.Chronic Low Back  Pain: Refilled: Oxycodone 15 mg one tablet every 6 hours as needed #120 and MS Contin 100 mg one tablet every 12 hours #60. Second script given for the following month. Continue Gabapentin and Pamelor We will continue the opioid monitoring program, this consists of regular clinic visits, examinations, urine drug screen, pill counts as well as use of [place] Controlled Substance Reporting System. 2. Degenerative Disk Disease:Continue exercise, and heat therapy. Continue Current Medication Regime.  3. Osteoarthritis of Bilateral Knees: Continue exercise and heat therapy.  4. Migraine Headaches: On Maxalt.  5. Smoking Cessation: Encouraged to Continue with smoking cessation 6. Constipation: Continue Linzess 7. Muscle Spasm: Continue Tizanidine  20 minutes of face to face patient care time was spent during this visit. All questions were encouraged and answered.   F/U in 2 months.

## 2016-01-26 DIAGNOSIS — M1711 Unilateral primary osteoarthritis, right knee: Secondary | ICD-10-CM | POA: Diagnosis not present

## 2016-02-02 ENCOUNTER — Other Ambulatory Visit: Payer: Self-pay | Admitting: Physical Medicine & Rehabilitation

## 2016-02-02 NOTE — Telephone Encounter (Signed)
Error

## 2016-02-26 ENCOUNTER — Other Ambulatory Visit: Payer: Self-pay | Admitting: Registered Nurse

## 2016-03-03 ENCOUNTER — Other Ambulatory Visit: Payer: Self-pay | Admitting: Physical Medicine & Rehabilitation

## 2016-03-03 ENCOUNTER — Other Ambulatory Visit: Payer: Self-pay | Admitting: Registered Nurse

## 2016-03-09 ENCOUNTER — Encounter: Payer: Self-pay | Admitting: Registered Nurse

## 2016-03-09 ENCOUNTER — Telehealth: Payer: Self-pay | Admitting: Registered Nurse

## 2016-03-09 ENCOUNTER — Encounter: Payer: Medicare Other | Attending: Physical Medicine and Rehabilitation | Admitting: Registered Nurse

## 2016-03-09 VITALS — BP 108/73 | HR 106 | Resp 16

## 2016-03-09 DIAGNOSIS — F410 Panic disorder [episodic paroxysmal anxiety] without agoraphobia: Secondary | ICD-10-CM

## 2016-03-09 DIAGNOSIS — M1712 Unilateral primary osteoarthritis, left knee: Secondary | ICD-10-CM

## 2016-03-09 DIAGNOSIS — F411 Generalized anxiety disorder: Secondary | ICD-10-CM | POA: Diagnosis not present

## 2016-03-09 DIAGNOSIS — Z79899 Other long term (current) drug therapy: Secondary | ICD-10-CM

## 2016-03-09 DIAGNOSIS — Z760 Encounter for issue of repeat prescription: Secondary | ICD-10-CM | POA: Insufficient documentation

## 2016-03-09 DIAGNOSIS — M47816 Spondylosis without myelopathy or radiculopathy, lumbar region: Secondary | ICD-10-CM

## 2016-03-09 DIAGNOSIS — G894 Chronic pain syndrome: Secondary | ICD-10-CM

## 2016-03-09 DIAGNOSIS — M1711 Unilateral primary osteoarthritis, right knee: Secondary | ICD-10-CM

## 2016-03-09 DIAGNOSIS — M1288 Other specific arthropathies, not elsewhere classified, other specified site: Secondary | ICD-10-CM

## 2016-03-09 DIAGNOSIS — M791 Myalgia: Secondary | ICD-10-CM | POA: Diagnosis not present

## 2016-03-09 DIAGNOSIS — M7918 Myalgia, other site: Secondary | ICD-10-CM

## 2016-03-09 DIAGNOSIS — Z5181 Encounter for therapeutic drug level monitoring: Secondary | ICD-10-CM

## 2016-03-09 MED ORDER — OXYCODONE HCL 15 MG PO TABS: 15.0000 mg | ORAL_TABLET | Freq: Four times a day (QID) | ORAL | 0 refills | Status: DC | PRN

## 2016-03-09 MED ORDER — MORPHINE SULFATE ER 100 MG PO TBCR: 100.0000 mg | EXTENDED_RELEASE_TABLET | Freq: Two times a day (BID) | ORAL | 0 refills | Status: DC

## 2016-03-09 NOTE — Telephone Encounter (Signed)
On 03/09/2016 the [place] was reviewed no conflict was seen on the [place] with multiple prescribers. [name] has a signed narcotic contract with our office. If there were any discrepancies this would have been reported to her physician.

## 2016-03-09 NOTE — Progress Notes (Signed)
Subjective:    Patient ID: [name], female    DOB: Jul 02, 1960, 56 y.o.   MRN: [id number]  HPI: [name] is a 56 year old female who returns for follow up appointment for chronic pain and medication refill. She states her pain is located in her mid-lower back and  bilateral knees  R>L. She rates her pain 7. Her current exercise regime is performing chair exercises every other day,using stretching bands and walking short distances with her cane.  She has received her right knee brace, she was unable to wear today, due to hypersensitivity.  Also states she has quit smoking,last cigarette 02/07/2016   Pain Inventory Average Pain 8 Pain Right Now 7 My pain is constant, sharp, burning, dull, stabbing, tingling and aching  In the last 24 hours, has pain interfered with the following? General activity 9 Relation with others 9 Enjoyment of life 9 What TIME of day is your pain at its worst? n/a Sleep (in general) Poor  Pain is worse with: walking, bending, standing and some activites Pain improves with: rest, heat/ice, therapy/exercise, medication, TENS and injections Relief from Meds: 7  Mobility use a cane how many minutes can you walk? 5-7 ability to climb steps?  yes do you drive?  yes Do you have any goals in this area?  yes  Function not employed: date last employed 02/24/2003 disabled: date disabled 07/07 I need assistance with the following:  bathing, meal prep, household duties and shopping Do you have any goals in this area?  yes  Neuro/Psych weakness numbness tremor tingling trouble walking spasms dizziness confusion depression anxiety  Prior Studies Any changes since last visit?  no  Physicians involved in your care Any changes since last visit?  no   Family History  Problem Relation Age of Onset  . Depression Mother   . Hypertension Mother   . Hypertension Father   . Heart failure Father   . Diabetes Father   . Heart  attack Father   . Colon cancer Neg Hx   . Esophageal cancer Neg Hx   . Stomach cancer Neg Hx   . Rectal cancer Neg Hx    Social History   Social History  . Marital status: Married    Spouse name: [name]  . Number of children: 1  . Years of education: some colle   Occupational History  . On disability Unemployed   Social History Main Topics  . Smoking status: Former Smoker    Packs/day: 0.50    Types: Cigarettes    Quit date: 02/06/2016  . Smokeless tobacco: Never Used     Comment: going to try e-sticks to quit  . Alcohol use No  . Drug use: No  . Sexual activity: Not Currently    Partners: Male   Other Topics Concern  . None   Social History Narrative   8-10 cups of soda a day.  On disability. No regular exercise.     Past Surgical History:  Procedure Laterality Date  . ABDOMINAL HYSTERECTOMY  May 2004  . Basel Cell Carcinoma  06/2005, 07/2005   nasal tip and reconstruction  . CARPAL TUNNEL RELEASE  07/1993   both hands  . COLONOSCOPY    . HEMORROIDECTOMY  July 2003  . KNEE ARTHROPLASTY  09/2001, 05/2003   left knee, chondromalasia  . KNEE ARTHROSCOPY  jan 2005   Right knee, tisse release, chrondromalacia  . REPLACEMENT TOTAL KNEE  Nov 2005   Left   .  THYROID LOBECTOMY  01/2005   malignant area removed from right lobe  . THYROID SURGERY     thyroid lobe removal  . TONSILLECTOMY  09/1972  . TUBAL LIGATION  Sept 1999  . Ulnar nerve entrapment  Feb 1995   left elbow   Past Medical History:  Diagnosis Date  . Allergy   . Anxiety   . Cancer ([place])    cancerous nodules on thyroid  . Cancer ([place])    basal cell Cancer-nose  . Chronic pain syndrome   . COPD (chronic obstructive pulmonary disease) ([place])   . Degeneration of lumbar or lumbosacral intervertebral disc   . Depression   . Facet syndrome, lumbar   . GERD (gastroesophageal reflux disease)   . Hyperlipidemia   . Intervertebral lumbar disc disorder with myelopathy, lumbar region   . Lumbosacral  spondylosis without myelopathy   . Migraine without aura, with intractable migraine, so stated, without mention of status migrainosus   . Primary localized osteoarthrosis, lower leg   . Thyroid disease    hypothyroid  . Unspecified musculoskeletal disorders and symptoms referable to neck    cervical/trapezius   Pulse (!) 107   Resp 16   SpO2 92%   Opioid Risk Score:   Fall Risk Score:  `1  Depression screen PHQ 2/9  Depression screen [facility] 2/9 11/10/2015 09/06/2015 03/16/2015 09/17/2014 04/21/2014  Decreased Interest 3 3 3 3 3   Down, Depressed, Hopeless 3 - 3 3 -  PHQ - 2 Score 6 3 6 6 3   Altered sleeping - - - - 3  Tired, decreased energy - - - - 3  Change in appetite - - - - 3  Feeling bad or failure about yourself  - - - - 2  Trouble concentrating - - - - 2  Moving slowly or fidgety/restless - - - - 1  Suicidal thoughts - - - - 1  PHQ-9 Score - - - - 18     Review of Systems  Constitutional: Positive for appetite change and unexpected weight change.       Night sweats  HENT: Negative.   Eyes: Negative.   Respiratory: Negative.   Cardiovascular: Negative.   Gastrointestinal: Positive for nausea and vomiting.  Endocrine: Negative.   Genitourinary: Negative.   Musculoskeletal:       Limb swelling  Skin: Negative.   Allergic/Immunologic: Negative.   Neurological: Negative.   Hematological: Negative.   Psychiatric/Behavioral: Negative.   All other systems reviewed and are negative.      Objective:   Physical Exam  Constitutional: She is oriented to person, place, and time. She appears well-developed and well-nourished.  HENT:  Head: Normocephalic and atraumatic.  Neck: Normal range of motion. Neck supple.  Cardiovascular: Normal rate and regular rhythm.   Pulmonary/Chest: Effort normal and breath sounds normal.  Musculoskeletal:  Normal Muscle Bulk and Muscle Testing Reveals: Upper Extremities: Full ROM and Muscle Strength 5/5 Thoracic Paraspinal Tenderness:  T-5-T-7 Lower Extremities: Left: Full ROM and Muscle Strength 5/5 Right: Decreased ROM and Muscle Strength 4/5 Right Lower Extremity Flexion Produces Pain into Patella Arises from Table Slowly using 3 prong cane for support Antalgic Gait    Neurological: She is alert and oriented to person, place, and time.  Skin: Skin is warm and dry.  Psychiatric: She has a normal mood and affect.  Nursing note and vitals reviewed.         Assessment & Plan:  1.Chronic Low Back Pain: 03/09/2016  Refilled: Oxycodone  15 mg one tablet every 6 hours as needed #120 and MS Contin 100 mg one tablet every 12 hours #60. Second script given for the following month. Continue Gabapentin and Pamelor We will continue the opioid monitoring program, this consists of regular clinic visits, examinations, urine drug screen, pill counts as well as use of [place] Controlled Substance Reporting System. 2. Degenerative Disk Disease:Continue exercise, and heat therapy. Continue Current Medication Regime. 03/09/2016 3. Osteoarthritis of Bilateral Knees: Continue exercise and heat therapy. 03/09/2016 4. Migraine Headaches: On Maxalt. 03/09/2016 5. Smoking Cessation: She has quit smoking since 03/09/2016 6. Constipation: Continue Linzess. 03/09/2016 7. Muscle Spasm: Continue Tizanidine.03/09/2016 8. Anxiety/ Panic Attacks: Referral to Psychology  20 minutes of face to face patient care time was spent during this visit. All questions were encouraged and answered.   F/U in 1 month

## 2016-03-24 ENCOUNTER — Other Ambulatory Visit: Payer: Self-pay | Admitting: Physical Medicine & Rehabilitation

## 2016-04-05 NOTE — Telephone Encounter (Signed)
error 

## 2016-04-06 ENCOUNTER — Encounter: Payer: Medicare Other | Attending: Physical Medicine and Rehabilitation | Admitting: Registered Nurse

## 2016-04-06 ENCOUNTER — Encounter: Payer: Self-pay | Admitting: Registered Nurse

## 2016-04-06 VITALS — BP 149/86 | HR 112

## 2016-04-06 DIAGNOSIS — G43119 Migraine with aura, intractable, without status migrainosus: Secondary | ICD-10-CM

## 2016-04-06 DIAGNOSIS — G894 Chronic pain syndrome: Secondary | ICD-10-CM | POA: Diagnosis not present

## 2016-04-06 DIAGNOSIS — M7062 Trochanteric bursitis, left hip: Secondary | ICD-10-CM | POA: Diagnosis not present

## 2016-04-06 DIAGNOSIS — M791 Myalgia: Secondary | ICD-10-CM | POA: Diagnosis not present

## 2016-04-06 DIAGNOSIS — M7918 Myalgia, other site: Secondary | ICD-10-CM

## 2016-04-06 DIAGNOSIS — Z79899 Other long term (current) drug therapy: Secondary | ICD-10-CM

## 2016-04-06 DIAGNOSIS — F411 Generalized anxiety disorder: Secondary | ICD-10-CM

## 2016-04-06 DIAGNOSIS — Z5181 Encounter for therapeutic drug level monitoring: Secondary | ICD-10-CM

## 2016-04-06 DIAGNOSIS — M47816 Spondylosis without myelopathy or radiculopathy, lumbar region: Secondary | ICD-10-CM

## 2016-04-06 DIAGNOSIS — M1712 Unilateral primary osteoarthritis, left knee: Secondary | ICD-10-CM | POA: Diagnosis not present

## 2016-04-06 DIAGNOSIS — M7061 Trochanteric bursitis, right hip: Secondary | ICD-10-CM

## 2016-04-06 DIAGNOSIS — Z760 Encounter for issue of repeat prescription: Secondary | ICD-10-CM | POA: Diagnosis not present

## 2016-04-06 DIAGNOSIS — M1288 Other specific arthropathies, not elsewhere classified, other specified site: Secondary | ICD-10-CM | POA: Diagnosis not present

## 2016-04-06 DIAGNOSIS — M1711 Unilateral primary osteoarthritis, right knee: Secondary | ICD-10-CM

## 2016-04-06 DIAGNOSIS — G9050 Complex regional pain syndrome I, unspecified: Secondary | ICD-10-CM | POA: Diagnosis not present

## 2016-04-06 MED ORDER — OXYCODONE HCL 15 MG PO TABS: 15.0000 mg | ORAL_TABLET | Freq: Four times a day (QID) | ORAL | 0 refills | Status: DC | PRN

## 2016-04-06 MED ORDER — MORPHINE SULFATE ER 100 MG PO TBCR: 100.0000 mg | EXTENDED_RELEASE_TABLET | Freq: Two times a day (BID) | ORAL | 0 refills | Status: DC

## 2016-04-06 NOTE — Progress Notes (Signed)
Subjective:    Patient ID: [name], female    DOB: [birth year], 58 y.o.   MRN: [id number]  HPI:  [name] is a 56 year old female who returns for follow up appointment for chronic pain and medication refill. She states her pain is located in her neck, left shoulder,lower back, bilateral hips and  bilateral knees R>L. She rates her pain 8. Her current exercise regime is using simply fit board for five minutes daily,  performing chair exercises every other day,using stretching bands and walking short distances with her cane.  Pain Inventory Average Pain 9 Pain Right Now 8 My pain is constant, sharp, burning, dull, stabbing, tingling and aching  In the last 24 hours, has pain interfered with the following? General activity 9 Relation with others 10 Enjoyment of life 10 What TIME of day is your pain at its worst? . Sleep (in general) Poor  Pain is worse with: walking, bending, standing and some activites Pain improves with: rest, heat/ice, therapy/exercise, pacing activities, medication and TENS Relief from Meds: 6  Mobility use a cane ability to climb steps?  yes do you drive?  yes  Function not employed: date last employed . disabled: date disabled .  Neuro/Psych weakness numbness tremor tingling trouble walking spasms dizziness confusion depression anxiety  Prior Studies Any changes since last visit?  no  Physicians involved in your care Any changes since last visit?  no   Family History  Problem Relation Age of Onset  . Depression Mother   . Hypertension Mother   . Hypertension Father   . Heart failure Father   . Diabetes Father   . Heart attack Father   . Colon cancer Neg Hx   . Esophageal cancer Neg Hx   . Stomach cancer Neg Hx   . Rectal cancer Neg Hx    Social History   Social History  . Marital status: Married    Spouse name: [name]  . Number of children: 1  . Years of education: some colle   Occupational  History  . On disability Unemployed   Social History Main Topics  . Smoking status: Former Smoker    Packs/day: 0.50    Types: Cigarettes    Quit date: 02/06/2016  . Smokeless tobacco: Never Used     Comment: going to try e-sticks to quit  . Alcohol use No  . Drug use: No  . Sexual activity: Not Currently    Partners: Male   Other Topics Concern  . Not on file   Social History Narrative   8-10 cups of soda a day.  On disability. No regular exercise.     Past Surgical History:  Procedure Laterality Date  . ABDOMINAL HYSTERECTOMY  May 2004  . Basel Cell Carcinoma  06/2005, 07/2005   nasal tip and reconstruction  . CARPAL TUNNEL RELEASE  07/1993   both hands  . COLONOSCOPY    . HEMORROIDECTOMY  July 2003  . KNEE ARTHROPLASTY  09/2001, 05/2003   left knee, chondromalasia  . KNEE ARTHROSCOPY  jan 2005   Right knee, tisse release, chrondromalacia  . REPLACEMENT TOTAL KNEE  Nov 2005   Left   . THYROID LOBECTOMY  01/2005   malignant area removed from right lobe  . THYROID SURGERY     thyroid lobe removal  . TONSILLECTOMY  09/1972  . TUBAL LIGATION  Sept 1999  . Ulnar nerve entrapment  Feb 1995   left elbow   Past  Medical History:  Diagnosis Date  . Allergy   . Anxiety   . Cancer ([place])    cancerous nodules on thyroid  . Cancer ([place])    basal cell Cancer-nose  . Chronic pain syndrome   . COPD (chronic obstructive pulmonary disease) ([place])   . Degeneration of lumbar or lumbosacral intervertebral disc   . Depression   . Facet syndrome, lumbar   . GERD (gastroesophageal reflux disease)   . Hyperlipidemia   . Intervertebral lumbar disc disorder with myelopathy, lumbar region   . Lumbosacral spondylosis without myelopathy   . Migraine without aura, with intractable migraine, so stated, without mention of status migrainosus   . Primary localized osteoarthrosis, lower leg   . Thyroid disease    hypothyroid  . Unspecified musculoskeletal disorders and symptoms referable to  neck    cervical/trapezius   There were no vitals taken for this visit.  Opioid Risk Score:   Fall Risk Score:  `1  Depression screen PHQ 2/9  Depression screen [facility] 2/9 11/10/2015 09/06/2015 03/16/2015 09/17/2014 04/21/2014  Decreased Interest 3 3 3 3 3   Down, Depressed, Hopeless 3 - 3 3 -  PHQ - 2 Score 6 3 6 6 3   Altered sleeping - - - - 3  Tired, decreased energy - - - - 3  Change in appetite - - - - 3  Feeling bad or failure about yourself  - - - - 2  Trouble concentrating - - - - 2  Moving slowly or fidgety/restless - - - - 1  Suicidal thoughts - - - - 1  PHQ-9 Score - - - - 18    Review of Systems  Constitutional: Positive for appetite change, diaphoresis and unexpected weight change.  HENT: Negative.   Eyes: Negative.   Respiratory: Negative.   Cardiovascular: Negative.   Gastrointestinal: Positive for nausea and vomiting.  Endocrine: Negative.   Genitourinary: Negative.   Musculoskeletal: Positive for back pain and gait problem.  Skin: Negative.   Allergic/Immunologic: Negative.   Neurological: Positive for weakness and numbness.  Hematological: Negative.   Psychiatric/Behavioral: Negative.        Objective:   Physical Exam  Constitutional: She is oriented to person, place, and time. She appears well-developed and well-nourished.  HENT:  Head: Normocephalic.  Neck: Normal range of motion. Neck supple.  Cervical Paraspinal Tenderness: C-5-C-6  Cardiovascular: Normal rate and regular rhythm.   Pulmonary/Chest: Effort normal and breath sounds normal.  Musculoskeletal:  Normal Muscle Bulk and Muscle Testing Reveals: Upper Extremities: Full ROM and Muscle Strength 5/5 Thoracic Paraspinal Tenderness: T-7-T-9  T-11-T-12 Lumbar Hypersensitivity Bilateral Greater Trochanteric Tenderness Lower Extremities: Full ROM and Muscle Strength 5/5 Lower Extremities with Hypersensitivity with Palpation Arises from Table slowly using three prong cane for support Antalgic  Gait   Neurological: She is alert and oriented to person, place, and time.  Skin: Skin is warm and dry.  Psychiatric: She has a normal mood and affect.  Nursing note and vitals reviewed.         Assessment & Plan:  1.Chronic Low Back Pain/ Lumbar Facet Arthropathy: 04/06/2016  Refilled: Oxycodone 15 mg one tablet every 6 hours as needed #120 and MS Contin 100 mg one tablet every 12 hours #60. Second script given for the following month to accommodate scheduled appointment. Continue Gabapentin and Pamelor We will continue the opioid monitoring program, this consists of regular clinic visits, examinations, urine drug screen, pill counts as well as use of [place] Controlled Substance  Reporting System. 2. Degenerative Disk Disease:Continue exercise, and heat therapy. Continue Current Medication Regime. 04/06/2016 3. Bilateral Greater Trochanteric Bursitis: Continue with Heat and Ice Therapy. 04/06/2016 4. Osteoarthritis of Bilateral Knees: Continue exercise and heat therapy. 04/06/2016 5. Migraine Headaches: On Maxalt. 04/06/2016 6. Smoking Cessation: She has quit smoking since 03/09/2016 7. Constipation: Continue Linzess. 04/06/2016 8. Muscle Spasm: Continue Tizanidine.04/06/2016 9. DepressionAnxiety/ Panic Attacks: Continue Celexa Referral to Psychology 10. RSD: Continue with current medication Regime  30 minutes of face to face patient care time was spent during this visit. All questions were encouraged and answered.   F/U in 1 month

## 2016-04-06 NOTE — Addendum Note (Signed)
Addended by: Marland Mcalpine B on: 04/06/2016 11:10 AM   Modules accepted: Orders

## 2016-04-08 ENCOUNTER — Other Ambulatory Visit: Payer: Self-pay | Admitting: Family Medicine

## 2016-04-25 ENCOUNTER — Encounter (HOSPITAL_BASED_OUTPATIENT_CLINIC_OR_DEPARTMENT_OTHER): Payer: Medicare Other | Admitting: Psychology

## 2016-04-25 DIAGNOSIS — Z760 Encounter for issue of repeat prescription: Secondary | ICD-10-CM | POA: Diagnosis not present

## 2016-04-25 DIAGNOSIS — M47816 Spondylosis without myelopathy or radiculopathy, lumbar region: Secondary | ICD-10-CM

## 2016-04-25 DIAGNOSIS — F410 Panic disorder [episodic paroxysmal anxiety] without agoraphobia: Secondary | ICD-10-CM | POA: Diagnosis not present

## 2016-04-25 DIAGNOSIS — M1288 Other specific arthropathies, not elsewhere classified, other specified site: Secondary | ICD-10-CM | POA: Diagnosis not present

## 2016-04-25 DIAGNOSIS — F411 Generalized anxiety disorder: Secondary | ICD-10-CM | POA: Diagnosis not present

## 2016-04-25 DIAGNOSIS — G9050 Complex regional pain syndrome I, unspecified: Secondary | ICD-10-CM | POA: Diagnosis not present

## 2016-04-28 ENCOUNTER — Other Ambulatory Visit: Payer: Self-pay | Admitting: Physical Medicine & Rehabilitation

## 2016-05-05 ENCOUNTER — Other Ambulatory Visit: Payer: Self-pay | Admitting: Physical Medicine & Rehabilitation

## 2016-05-09 ENCOUNTER — Encounter: Payer: Self-pay | Admitting: Psychology

## 2016-05-09 NOTE — Progress Notes (Signed)
Neuropsychological Consultation   Patient:   [name]   DOB:   Jul 24, 1960  MR Number:  [id number]  Location:  [facility] [address], [place] [id number] [place] [address] Dept: [phone]           Date of Service:   04/25/2016  Start Time:   9 AM End Time:   10 AM  Provider/Observer:  Ilean Skill, Psy.D.       Clinical Neuropsychologist       Billing Code/Service: [phone] 4 Units  Chief Complaint:    Panic attacks, anxiety, depression and chronic pain (RSD)  Reason for Service:  The patient is a 55 year old female that was referred for psychotherapeutic interventions because of issues related to panic attacks, anxiety, rash the patient has been dealing with chronic pain as well as cluster type migraine as well. The patient describes panic attacks as having a racing heart, becoming and feeling clammy and sweaty, experiencing tremors as well as. The patient experiences chronic pain in her shoulders, knees, hips, and low back. She reports that her migraine headaches are having a longer and longer duration going on for years.  The patient reports that she has been diagnosed with degenerative disc in her back as well as degenerative cartilage in her knees. She is also subsequently developed RSD. She reports that Dr. Maureen Ralphs performed a partial knee replacement in the left knee but feels he cannot do more about it. The patient reports that her sleep is very disturbed and broken up during the evening. She reports that she gets about 2 and half hours of sleep each night and will sleep some during the day or night. She feels like she is a "vampire" for being up at night all the time. The patient reports that she is able to do many daily activities such as cooking and that her husband gets by just by eating frozen meals.  Current Status:  The patient describes moderate to  significant symptoms of depression, anxiety, appetite changes, insomnia, confusion, memory problems, loss of interest, episodic suicidal ideation, marital distress, low energy, panic attacks, obsessive thoughts, or concentration. She reports that she feels like she just does not have a life  Reliability of Information: Information is derived by review of available medical records as well as a one-hour clinical interview with the patient.  Behavioral Observation: [name]  presents as a 56 y.o.-year-old Right  Female who appeared her stated age. her dress was Appropriate and she was Well Groomed and her manners were Appropriate to the situation.  her participation was indicative of Appropriate behaviors.  There were any physical disabilities noted.  she displayed an appropriate level of cooperation and motivation.     Interactions:    Active Appropriate and Attentive  Attention:   The patient did appear to be distracted by some internal preoccupations as well as distress reviewing all of the difficulties that she has been having. and attention span appeared shorter than expected for age  Memory:   within normal limits; recent and remote memory intact  Visuo-spatial:  within normal limits  Speech (Volume):  normal  Speech:   normal; normal  Thought Process:  Coherent  Though Content:  WNL;   Orientation:   person, place, time/date and situation  Judgment:   Fair  Planning:   Fair  Affect:    Anxious and Depressed  Mood:    Anxious and  Depressed  Insight:   Good  Intelligence:   normal  Marital Status/Living: The patient was born and raised in [place].  She is married and has been divorced 3 times. She is a 78 year old daughter and a 2 year old adopted daughter. She currently lives with her husband ill. Her mother is now living in a nursing home in [place] when she sees her to 3 times year.  Current Employment: The patient is not  currently working.  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  Substance Use:  No concerns of substance abuse are reported.    Education:   HS Graduate  Medical History:   Past Medical History:  Diagnosis Date  . Allergy   . Anxiety   . Cancer ([place])    cancerous nodules on thyroid  . Cancer ([place])    basal cell Cancer-nose  . Chronic pain syndrome   . COPD (chronic obstructive pulmonary disease) ([place])   . Degeneration of lumbar or lumbosacral intervertebral disc   . Depression   . Facet syndrome, lumbar   . GERD (gastroesophageal reflux disease)   . Hyperlipidemia   . Intervertebral lumbar disc disorder with myelopathy, lumbar region   . Lumbosacral spondylosis without myelopathy   . Migraine without aura, with intractable migraine, so stated, without mention of status migrainosus   . Primary localized osteoarthrosis, lower leg   . Thyroid disease    hypothyroid  . Unspecified musculoskeletal disorders and symptoms referable to neck    cervical/trapezius       @problemlist @       Psychiatric History:  The patient has dealt with anxiety and depression as well as panic attacks for some time. She denies any prior formal psychiatric interventions as most of her medical interventions have been due to her physical injuries and medical status.  Family Med/Psych History:  Family History  Problem Relation Age of Onset  . Depression Mother   . Hypertension Mother   . Hypertension Father   . Heart failure Father   . Diabetes Father   . Heart attack Father   . Colon cancer Neg Hx   . Esophageal cancer Neg Hx   . Stomach cancer Neg Hx   . Rectal cancer Neg  Hx     Risk of Suicide/Violence: low the patient does acknowledge times of suicidal ideation she reports that she feels like she does not have a life and there are times where she will have suicidal faults and feels like she just gets tired of hurting all the time and that she has no friends in. The suicidal ideations appear to be more of ideas of escaping from her current situation and no true intent to harm herself. She denies developing any plan or any actions towards harming herself. We did discuss this issue and she does contract for safety and we went over the plan if she were to develop any type of intent.  Impression/DX:  The patient has a long history of significant depression anxiety as well as panic attacks. She is ongoing issues with RSD and chronic pain to she has had numerous orthopedic issues  throughout her life. The patient has started taking Celexa which was prescribed by Dr. Tessa Lerner to help with pain and depression. She reports that it is helping some and she does not have as many crying spells.  Disposition/Plan:  We will set the patient up for individual psychotherapeutic interventions to help manage the issues of depression anxiety as well as better cope with her chronic pain symptoms.  Diagnosis:    RSD (reflex sympathetic dystrophy)  Lumbar facet arthropathy  Generalized anxiety disorder  Panic attacks         Electronically Signed   _______________________ Ilean Skill, Psy.D.

## 2016-05-12 ENCOUNTER — Encounter: Payer: Medicare Other | Attending: Physical Medicine and Rehabilitation | Admitting: Psychology

## 2016-05-12 ENCOUNTER — Encounter: Payer: Self-pay | Admitting: Psychology

## 2016-05-12 DIAGNOSIS — F411 Generalized anxiety disorder: Secondary | ICD-10-CM | POA: Diagnosis not present

## 2016-05-12 DIAGNOSIS — F410 Panic disorder [episodic paroxysmal anxiety] without agoraphobia: Secondary | ICD-10-CM

## 2016-05-12 DIAGNOSIS — M1288 Other specific arthropathies, not elsewhere classified, other specified site: Secondary | ICD-10-CM | POA: Diagnosis not present

## 2016-05-12 DIAGNOSIS — Z760 Encounter for issue of repeat prescription: Secondary | ICD-10-CM | POA: Insufficient documentation

## 2016-05-12 DIAGNOSIS — M47816 Spondylosis without myelopathy or radiculopathy, lumbar region: Secondary | ICD-10-CM

## 2016-05-12 DIAGNOSIS — G9050 Complex regional pain syndrome I, unspecified: Secondary | ICD-10-CM | POA: Diagnosis not present

## 2016-05-12 NOTE — Progress Notes (Signed)
Patient:  [name]   DOB: [birth year]  MR Number: [id number]  Location: [facility] [address], [place] [id number] [place] [address] Dept: [phone]  Start: 8 AM End: 9 AM  Provider/Observer:     Edgardo Roys PSYD  Chief Complaint:      Chief Complaint  Patient presents with  . Pain  . Stress  . Anxiety  . Panic Attack  . Sleeping Problem    Reason For Service:     The patient is a 56 year old female that was referred for psychotherapeutic interventions because of issues related to panic attacks, anxiety, rash the patient has been dealing with chronic pain as well as cluster type migraine as well. The patient describes panic attacks as having a racing heart, becoming and feeling clammy and sweaty, experiencing tremors as well as. The patient experiences chronic pain in her shoulders, knees, hips, and low back. She reports that her migraine headaches are having a longer and longer duration going on for years.  The patient reports that she has been diagnosed with degenerative disc in her back as well as degenerative cartilage in her knees. She is also subsequently developed RSD. She reports that Dr. Maureen Ralphs performed a partial knee replacement in the left knee but feels he cannot do more about it. The patient reports that her sleep is very disturbed and broken up during the evening. She reports that she gets about 2 and half hours of sleep each night and will sleep some during the day or night. She feels like she is a "vampire" for being up at night all the time. The patient reports that she is able to do many daily activities such as cooking and that her husband gets by just by eating frozen meals.  Interventions Strategy:  Cognitive/behavioral psychotherapeutic interventions along with building coping skills and strategies and other adaptive techniques around  issues related to RSD, depression, and anxiety.  Participation Level:   Active  Participation Quality:  Appropriate      Behavioral Observation:  Well Groomed, Alert, and Tearful.   Current Psychosocial Factors: The patient reports that she has had some frustrations primarily related to her difficulty being able to see her granddaughter due to changes in his school schedule. The patient reports that this is one of the big things that she been the patient reports that she continues to sleep very poorly at night and does a lot of napping during the day.  Content of Session:   Reviewed current symptoms and work on therapeutic interventions are in issues related to her stressors around her marriage, medical issues, memory and cognitive changes etc.  Current Status:   The patient reports that she has been working on some of the coping adaptive strategies we have develop.  Patient Progress:   Stable  Target Goals:   Target goals include working better adaptive strategies.  Last Reviewed:   05/12/2016  Impression/Diagnosis:   The patient has a long history of significant depression anxiety as well as panic attacks. She is ongoing issues with RSD and chronic pain to she has had numerous orthopedic issues throughout her life. The patient has started taking Celexa which was prescribed by Dr. Tessa Lerner to help with pain and depression. She reports that it is helping some and she does not have as many crying spells  Diagnosis:   RSD (reflex sympathetic dystrophy)  Lumbar facet arthropathy  Generalized anxiety  disorder  Panic attacks

## 2016-05-15 ENCOUNTER — Encounter: Payer: Self-pay | Admitting: Physical Medicine & Rehabilitation

## 2016-05-15 ENCOUNTER — Encounter (HOSPITAL_BASED_OUTPATIENT_CLINIC_OR_DEPARTMENT_OTHER): Payer: Medicare Other | Admitting: Physical Medicine & Rehabilitation

## 2016-05-15 VITALS — BP 139/83 | HR 94 | Resp 14

## 2016-05-15 DIAGNOSIS — Z5181 Encounter for therapeutic drug level monitoring: Secondary | ICD-10-CM | POA: Diagnosis not present

## 2016-05-15 DIAGNOSIS — Z79899 Other long term (current) drug therapy: Secondary | ICD-10-CM

## 2016-05-15 DIAGNOSIS — G894 Chronic pain syndrome: Secondary | ICD-10-CM

## 2016-05-15 DIAGNOSIS — M791 Myalgia: Secondary | ICD-10-CM | POA: Diagnosis not present

## 2016-05-15 DIAGNOSIS — M7918 Myalgia, other site: Secondary | ICD-10-CM

## 2016-05-15 DIAGNOSIS — Z760 Encounter for issue of repeat prescription: Secondary | ICD-10-CM | POA: Diagnosis not present

## 2016-05-15 MED ORDER — MORPHINE SULFATE ER 100 MG PO TBCR: 100.0000 mg | EXTENDED_RELEASE_TABLET | Freq: Two times a day (BID) | ORAL | 0 refills | Status: DC

## 2016-05-15 MED ORDER — OXYCODONE HCL 15 MG PO TABS: 15.0000 mg | ORAL_TABLET | Freq: Four times a day (QID) | ORAL | 0 refills | Status: DC | PRN

## 2016-05-15 NOTE — Patient Instructions (Signed)
PLEASE FEEL FREE TO CALL OUR OFFICE WITH ANY PROBLEMS OR QUESTIONS ([phone])      

## 2016-05-15 NOTE — Progress Notes (Signed)
Subjective:    Patient ID: [name], female    DOB: 12-09-1960, 56 y.o.   MRN: [id number]  HPI   [name] is here in follow up of her chronic knee pain. She feels that the right knee is getting worse. She wears the neoprene, hinged brace which helps to a degree. Her right continues to swell quite a bit which inhibits tolerance of the brace. She is working with Dr. Sima Matas on better coping skills as it pertains to her depression. She is also addressing improving her sleep.   She remain son ms contin and oxycodone for pain control which seem to still provide relief. She forgot her empty bottles today which is a first!  She is proud to tell me that she quit smoking on 02/07/16 and hasn't fallen off the wagon yet!!  [name] is also working on low impact exercise and improving her diet.   Pain Inventory Average Pain 8 Pain Right Now 8 My pain is constant, sharp, burning, dull, stabbing, tingling and aching  In the last 24 hours, has pain interfered with the following? General activity 10 Relation with others 10 Enjoyment of life 10 What TIME of day is your pain at its worst? all Sleep (in general) Poor  Pain is worse with: walking, bending, standing and some activites Pain improves with: rest, heat/ice, medication, TENS and injections Relief from Meds: 6  Mobility walk with assistance use a cane how many minutes can you walk? 5-7 ability to climb steps?  yes do you drive?  yes Do you have any goals in this area?  yes  Function not employed: date last employed . disabled: date disabled . I need assistance with the following:  bathing, meal prep, household duties and shopping  Neuro/Psych bladder control problems weakness numbness tingling trouble walking spasms confusion depression anxiety  Prior Studies Any changes since last visit?  no bone scan x-rays CT/MRI  Physicians involved in your care Any changes since last visit?  no   Family  History  Problem Relation Age of Onset  . Depression Mother   . Hypertension Mother   . Hypertension Father   . Heart failure Father   . Diabetes Father   . Heart attack Father   . Colon cancer Neg Hx   . Esophageal cancer Neg Hx   . Stomach cancer Neg Hx   . Rectal cancer Neg Hx    Social History   Social History  . Marital status: Married    Spouse name: [name]  . Number of children: 1  . Years of education: some colle   Occupational History  . On disability Unemployed   Social History Main Topics  . Smoking status: Former Smoker    Packs/day: 0.50    Types: Cigarettes    Quit date: 02/06/2016  . Smokeless tobacco: Never Used     Comment: going to try e-sticks to quit  . Alcohol use No  . Drug use: No  . Sexual activity: Not Currently    Partners: Male   Other Topics Concern  . None   Social History Narrative   8-10 cups of soda a day.  On disability. No regular exercise.     Past Surgical History:  Procedure Laterality Date  . ABDOMINAL HYSTERECTOMY  May 2004  . Basel Cell Carcinoma  06/2005, 07/2005   nasal tip and reconstruction  . CARPAL TUNNEL RELEASE  07/1993   both hands  . COLONOSCOPY    . HEMORROIDECTOMY  July  2003  . KNEE ARTHROPLASTY  09/2001, 05/2003   left knee, chondromalasia  . KNEE ARTHROSCOPY  jan 2005   Right knee, tisse release, chrondromalacia  . REPLACEMENT TOTAL KNEE  Nov 2005   Left   . THYROID LOBECTOMY  01/2005   malignant area removed from right lobe  . THYROID SURGERY     thyroid lobe removal  . TONSILLECTOMY  09/1972  . TUBAL LIGATION  Sept 1999  . Ulnar nerve entrapment  Feb 1995   left elbow   Past Medical History:  Diagnosis Date  . Allergy   . Anxiety   . Cancer ([place])    cancerous nodules on thyroid  . Cancer ([place])    basal cell Cancer-nose  . Chronic pain syndrome   . COPD (chronic obstructive pulmonary disease) ([place])   . Degeneration of lumbar or lumbosacral intervertebral disc   . Depression   . Facet  syndrome, lumbar   . GERD (gastroesophageal reflux disease)   . Hyperlipidemia   . Intervertebral lumbar disc disorder with myelopathy, lumbar region   . Lumbosacral spondylosis without myelopathy   . Migraine without aura, with intractable migraine, so stated, without mention of status migrainosus   . Primary localized osteoarthrosis, lower leg   . Thyroid disease    hypothyroid  . Unspecified musculoskeletal disorders and symptoms referable to neck    cervical/trapezius   BP 139/83 (BP Location: Left Arm, Patient Position: Sitting, Cuff Size: Large)   Pulse 94   Resp 14   SpO2 97%   Opioid Risk Score:   Fall Risk Score:  `1  Depression screen PHQ 2/9  Depression screen [facility] 2/9 11/10/2015 09/06/2015 03/16/2015 09/17/2014 04/21/2014  Decreased Interest 3 3 3 3 3   Down, Depressed, Hopeless 3 - 3 3 -  PHQ - 2 Score 6 3 6 6 3   Altered sleeping - - - - 3  Tired, decreased energy - - - - 3  Change in appetite - - - - 3  Feeling bad or failure about yourself  - - - - 2  Trouble concentrating - - - - 2  Moving slowly or fidgety/restless - - - - 1  Suicidal thoughts - - - - 1  PHQ-9 Score - - - - 18    Review of Systems  Constitutional: Positive for appetite change, diaphoresis and unexpected weight change.  HENT: Negative.   Eyes: Negative.   Respiratory: Negative.   Cardiovascular: Negative.   Gastrointestinal: Positive for nausea and vomiting.  Endocrine: Negative.   Genitourinary: Positive for frequency and urgency.       Incontinence  Musculoskeletal: Positive for arthralgias, back pain, gait problem, myalgias, neck pain and neck stiffness.       Spasms   Skin: Negative.   Allergic/Immunologic: Negative.   Neurological: Positive for weakness and numbness.       Tingling  Hematological: Negative.   Psychiatric/Behavioral: Positive for confusion and dysphoric mood. The patient is nervous/anxious.   All other systems reviewed and are negative.      Objective:    Physical Exam  Constitutional: She is oriented to person, place, and time. She appears well-developedand well-nourished.  HENT:  Head: Normocephalicand atraumatic.  Neck: Normal range of motion. Neck supple.  Cardiovascular: RRR.  Pulmonary/Chest: Normal effort.  Musculoskeletal: She exhibits edema.  Normal Muscle Bulk and Muscle Testing Reveals: Upper Extremities: Full ROM and Muscle Strength 5/5 Thoracic and Lumbar Hypersensitivity Lower Extremities: Full ROM and Muscle Strength 5/5 Right Lower Extremity Flexion  Produces pain into the Patella Pronged gait for support. Antalgic Gaitbilaterally needs extra time rising from the chair Right knee with continued joint effusion. Persistent medial and lateral joint line pain Neurological: She is alertand oriented to person, place, and time. Continued effusion Skin: intact Psychiatric: She has a normal mood and affect. In good spirits today         Assessment & Plan:  1.Chronic Low Back Pain: Refilled: Oxycodone 15 mg one tablet every 6 hours as needed #120 and MS Contin 100 mg one tablet every 12 hours #60. Second script given for the following month. Continue Gabapentin and Pamelor             -We will continue the opioid monitoring program, this consists of regular clinic visits, examinations, urine drug screen, pill counts as well as use of [place] Controlled Substance Reporting System. [place] was reviewed today. UDS today 2. Degenerative Disk Disease:Continue exercise, and heat therapy.  3. Osteoarthritis of Bilateral Knees: Continue exercise and HEP  -discussed edema control. Reviewed stockings, thigh-high 4. Migraine Headaches: On Maxalt.  5. Smoking Cessation: 3 months plus cessation! 6. Constipation: Continue Linzess 7. Muscle Spasm: Continue Tizanidine 8. Chronic depression: celexa. Continue counseling per [place].   -reviewed the importance of establishing better sleep habits and avoiding naps during  the day.   15 minutes of face to face patient care time was spent during this visit. All questions were encouraged and answered. Greater than 50% of time during this encounter was spent counseling patient/family in regard to brace design and stocking options. Marland Kitchen

## 2016-05-19 LAB — TOXASSURE SELECT,+ANTIDEPR,UR

## 2016-05-19 LAB — 6-ACETYLMORPHINE,TOXASSURE ADD
6-ACETYLMORPHINE: NEGATIVE
6-acetylmorphine: NOT DETECTED ng/mg creat

## 2016-05-25 ENCOUNTER — Telehealth: Payer: Self-pay | Admitting: *Deleted

## 2016-05-25 NOTE — Telephone Encounter (Signed)
Urine drug screen for this encounter is consistent for prescribed medication 

## 2016-05-31 ENCOUNTER — Other Ambulatory Visit: Payer: Self-pay | Admitting: Family Medicine

## 2016-05-31 ENCOUNTER — Other Ambulatory Visit: Payer: Self-pay | Admitting: Physical Medicine & Rehabilitation

## 2016-06-03 ENCOUNTER — Other Ambulatory Visit: Payer: Self-pay | Admitting: Physical Medicine & Rehabilitation

## 2016-06-03 ENCOUNTER — Other Ambulatory Visit: Payer: Self-pay | Admitting: Family Medicine

## 2016-06-08 ENCOUNTER — Encounter: Payer: Medicare Other | Admitting: Psychology

## 2016-06-29 ENCOUNTER — Encounter: Payer: Self-pay | Admitting: Psychology

## 2016-06-29 ENCOUNTER — Encounter: Payer: Medicare Other | Attending: Physical Medicine and Rehabilitation | Admitting: Psychology

## 2016-06-29 DIAGNOSIS — F410 Panic disorder [episodic paroxysmal anxiety] without agoraphobia: Secondary | ICD-10-CM

## 2016-06-29 DIAGNOSIS — G9050 Complex regional pain syndrome I, unspecified: Secondary | ICD-10-CM | POA: Diagnosis not present

## 2016-06-29 DIAGNOSIS — Z760 Encounter for issue of repeat prescription: Secondary | ICD-10-CM | POA: Diagnosis not present

## 2016-06-29 DIAGNOSIS — F411 Generalized anxiety disorder: Secondary | ICD-10-CM | POA: Diagnosis not present

## 2016-06-29 DIAGNOSIS — G894 Chronic pain syndrome: Secondary | ICD-10-CM

## 2016-06-29 NOTE — Progress Notes (Signed)
Patient:  [name]   DOB: Jun 07, 1960  MR Number: [id number]  Location: [facility] [address], [place] [id number] [place] [address] Dept: [phone]  Start: 8 AM End: 9 AM  Provider/Observer:     Edgardo Roys PSYD  Chief Complaint:      Chief Complaint  Patient presents with  . Pain  . Anxiety  . Depression  . Stress    Reason For Service:     The patient is a 56 year old female that was referred for psychotherapeutic interventions because of issues related to panic attacks, anxiety, rash the patient has been dealing with chronic pain as well as cluster type migraine as well. The patient describes panic attacks as having a racing heart, becoming and feeling clammy and sweaty, experiencing tremors as well as. The patient experiences chronic pain in her shoulders, knees, hips, and low back. She reports that her migraine headaches are having a longer and longer duration going on for years.  The patient reports that she has been diagnosed with degenerative disc in her back as well as degenerative cartilage in her knees. She is also subsequently developed RSD. She reports that Dr. Maureen Ralphs performed a partial knee replacement in the left knee but feels he cannot do more about it. The patient reports that her sleep is very disturbed and broken up during the evening. She reports that she gets about 2 and half hours of sleep each night and will sleep some during the day or night. She feels like she is a "vampire" for being up at night all the time. The patient reports that she is able to do many daily activities such as cooking and that her husband gets by just by eating frozen meals.  Interventions Strategy:  Cognitive/behavioral psychotherapeutic interventions along with building coping skills and strategies and other adaptive techniques around issues related to RSD,  depression, and anxiety.  Participation Level:   Active  Participation Quality:  Appropriate      Behavioral Observation:  Well Groomed, Alert, and Tearful.   Current Psychosocial Factors: The patient reports that she has been working on having things to do that she looks forward to.  She is going to grand daughter's birth day this weekend and is really looking forward to it.  Content of Session:   Reviewed current symptoms and work on therapeutic interventions are in issues related to her stressors around her marriage, medical issues, memory and cognitive changes etc.  Current Status:   The patient reports that she has been working on some of the coping and adaptive strategies we have develop.  She has also been working on better diet (vegs and fruits with peels along with dried beans).   Needs to add antiinflammatory seasonings.  She has worked on slow but longer duration exercise.   Patient Progress:   Stable  Target Goals:   Target goals include working better adaptive strategies.  Last Reviewed:   06/29/2016  Impression/Diagnosis:   The patient has a long history of significant depression anxiety as well as panic attacks. She is ongoing issues with RSD and chronic pain to she has had numerous orthopedic issues throughout her life. The patient has started taking Celexa which was prescribed by Dr. Tessa Lerner to help with pain and depression. She reports that it is helping some and she does not have as many crying spells  Diagnosis:   Chronic pain syndrome  RSD (  reflex sympathetic dystrophy)  Generalized anxiety disorder  Panic attacks

## 2016-07-04 ENCOUNTER — Other Ambulatory Visit: Payer: Self-pay | Admitting: Family Medicine

## 2016-07-14 ENCOUNTER — Telehealth: Payer: Self-pay | Admitting: Registered Nurse

## 2016-07-14 ENCOUNTER — Encounter: Payer: Self-pay | Admitting: Registered Nurse

## 2016-07-14 ENCOUNTER — Encounter: Payer: Medicare Other | Attending: Physical Medicine and Rehabilitation | Admitting: Registered Nurse

## 2016-07-14 VITALS — BP 126/88 | HR 101

## 2016-07-14 DIAGNOSIS — M1711 Unilateral primary osteoarthritis, right knee: Secondary | ICD-10-CM | POA: Diagnosis not present

## 2016-07-14 DIAGNOSIS — G894 Chronic pain syndrome: Secondary | ICD-10-CM | POA: Diagnosis not present

## 2016-07-14 DIAGNOSIS — M7061 Trochanteric bursitis, right hip: Secondary | ICD-10-CM | POA: Diagnosis not present

## 2016-07-14 DIAGNOSIS — M47816 Spondylosis without myelopathy or radiculopathy, lumbar region: Secondary | ICD-10-CM

## 2016-07-14 DIAGNOSIS — Z79899 Other long term (current) drug therapy: Secondary | ICD-10-CM | POA: Diagnosis not present

## 2016-07-14 DIAGNOSIS — G9050 Complex regional pain syndrome I, unspecified: Secondary | ICD-10-CM | POA: Diagnosis not present

## 2016-07-14 DIAGNOSIS — M7918 Myalgia, other site: Secondary | ICD-10-CM

## 2016-07-14 DIAGNOSIS — Z5181 Encounter for therapeutic drug level monitoring: Secondary | ICD-10-CM | POA: Diagnosis not present

## 2016-07-14 DIAGNOSIS — F411 Generalized anxiety disorder: Secondary | ICD-10-CM | POA: Diagnosis not present

## 2016-07-14 DIAGNOSIS — M1712 Unilateral primary osteoarthritis, left knee: Secondary | ICD-10-CM

## 2016-07-14 DIAGNOSIS — M4696 Unspecified inflammatory spondylopathy, lumbar region: Secondary | ICD-10-CM | POA: Diagnosis not present

## 2016-07-14 DIAGNOSIS — Z760 Encounter for issue of repeat prescription: Secondary | ICD-10-CM | POA: Diagnosis not present

## 2016-07-14 DIAGNOSIS — M7062 Trochanteric bursitis, left hip: Secondary | ICD-10-CM | POA: Diagnosis not present

## 2016-07-14 DIAGNOSIS — M791 Myalgia: Secondary | ICD-10-CM

## 2016-07-14 MED ORDER — OXYCODONE HCL 15 MG PO TABS: 15.0000 mg | ORAL_TABLET | Freq: Four times a day (QID) | ORAL | 0 refills | Status: DC | PRN

## 2016-07-14 MED ORDER — MORPHINE SULFATE ER 100 MG PO TBCR: 100.0000 mg | EXTENDED_RELEASE_TABLET | Freq: Two times a day (BID) | ORAL | 0 refills | Status: DC

## 2016-07-14 NOTE — Progress Notes (Signed)
Subjective:    Patient ID: [name], female    DOB: 01-Dec-1960, 56 y.o.   MRN: [id number]  HPI: [name] is a 56 year old female who returns for follow up appointmentfor chronic pain and medication refill. She states her pain is located in her lower back, bilateral hips and bilateral knees R>L. She rates her pain 7. Her current exercise regime is using her simply fit board three days a week,  performing chair exercises every other day,using stretching bands and walking short distances with her cane.  [name] last UDS was performed on 05/15/2016, it was consistent.   [name] was visiting with her daughter, she was sitting in a chair setting up her weekly medication, when her daughters dogs jumped on her and medication spilled. She is short on her Oxycodone, and will continue to use her remaining Oxycodone until the next refill, she verbalizes understanding.    Pain Inventory Average Pain 8 Pain Right Now 7 My pain is constant, sharp, burning, dull, stabbing, tingling and aching  In the last 24 hours, has pain interfered with the following? General activity 9 Relation with others 9 Enjoyment of life 10 What TIME of day is your pain at its worst? all Sleep (in general) Poor  Pain is worse with: walking, bending, sitting, inactivity, standing and some activites Pain improves with: rest, heat/ice, therapy/exercise, pacing activities, medication, TENS and injections Relief from Meds: 6  Mobility use a cane use a walker how many minutes can you walk? 5-7 ability to climb steps?  yes do you drive?  no  Function disabled: date disabled 07/07 I need assistance with the following:  dressing, bathing, household duties and shopping  Neuro/Psych weakness numbness tremor tingling trouble walking spasms dizziness confusion depression anxiety  Prior Studies Any changes since last visit?  no  Physicians involved in your care Any changes  since last visit?  yes   Family History  Problem Relation Age of Onset  . Depression Mother   . Hypertension Mother   . Hypertension Father   . Heart failure Father   . Diabetes Father   . Heart attack Father   . Colon cancer Neg Hx   . Esophageal cancer Neg Hx   . Stomach cancer Neg Hx   . Rectal cancer Neg Hx    Social History   Social History  . Marital status: Married    Spouse name: [name]  . Number of children: 1  . Years of education: some colle   Occupational History  . On disability Unemployed   Social History Main Topics  . Smoking status: Former Smoker    Packs/day: 0.50    Types: Cigarettes    Quit date: 02/06/2016  . Smokeless tobacco: Never Used     Comment: going to try e-sticks to quit  . Alcohol use No  . Drug use: No  . Sexual activity: Not Currently    Partners: Male   Other Topics Concern  . None   Social History Narrative   8-10 cups of soda a day.  On disability. No regular exercise.     Past Surgical History:  Procedure Laterality Date  . ABDOMINAL HYSTERECTOMY  May 2004  . Basel Cell Carcinoma  06/2005, 07/2005   nasal tip and reconstruction  . CARPAL TUNNEL RELEASE  07/1993   both hands  . COLONOSCOPY    . HEMORROIDECTOMY  July 2003  . KNEE ARTHROPLASTY  09/2001, 05/2003   left knee,  chondromalasia  . KNEE ARTHROSCOPY  jan 2005   Right knee, tisse release, chrondromalacia  . REPLACEMENT TOTAL KNEE  Nov 2005   Left   . THYROID LOBECTOMY  01/2005   malignant area removed from right lobe  . THYROID SURGERY     thyroid lobe removal  . TONSILLECTOMY  09/1972  . TUBAL LIGATION  Sept 1999  . Ulnar nerve entrapment  Feb 1995   left elbow   Past Medical History:  Diagnosis Date  . Allergy   . Anxiety   . Cancer ([place])    cancerous nodules on thyroid  . Cancer ([place])    basal cell Cancer-nose  . Chronic pain syndrome   . COPD (chronic obstructive pulmonary disease) ([place])   . Degeneration of lumbar or lumbosacral intervertebral  disc   . Depression   . Facet syndrome, lumbar ([place])   . GERD (gastroesophageal reflux disease)   . Hyperlipidemia   . Intervertebral lumbar disc disorder with myelopathy, lumbar region   . Lumbosacral spondylosis without myelopathy   . Migraine without aura, with intractable migraine, so stated, without mention of status migrainosus   . Primary localized osteoarthrosis, lower leg   . Thyroid disease    hypothyroid  . Unspecified musculoskeletal disorders and symptoms referable to neck    cervical/trapezius   BP 126/88   Pulse (!) 101   SpO2 93%   Opioid Risk Score:  5 Fall Risk Score:  `1  Depression screen PHQ 2/9  Depression screen [facility] 2/9 07/14/2016 11/10/2015 09/06/2015 03/16/2015 09/17/2014 04/21/2014  Decreased Interest 3 3 3 3 3 3   Down, Depressed, Hopeless 3 3 - 3 3 -  PHQ - 2 Score 6 6 3 6 6 3   Altered sleeping - - - - - 3  Tired, decreased energy - - - - - 3  Change in appetite - - - - - 3  Feeling bad or failure about yourself  - - - - - 2  Trouble concentrating - - - - - 2  Moving slowly or fidgety/restless - - - - - 1  Suicidal thoughts - - - - - 1  PHQ-9 Score - - - - - 18    Review of Systems  Constitutional: Positive for appetite change, diaphoresis and unexpected weight change.  HENT: Negative.   Eyes: Negative.   Respiratory: Negative.   Cardiovascular: Positive for leg swelling.  Gastrointestinal: Positive for nausea and vomiting.  Endocrine: Negative.   Genitourinary: Negative.   Musculoskeletal: Positive for gait problem.  Skin: Negative.   Allergic/Immunologic: Negative.   Neurological: Positive for dizziness, tremors, weakness and numbness.  Hematological: Negative.   Psychiatric/Behavioral: Positive for confusion and dysphoric mood. The patient is nervous/anxious.   All other systems reviewed and are negative.      Objective:   Physical Exam  Constitutional: She is oriented to person, place, and time. She appears well-developed and  well-nourished.  HENT:  Head: Normocephalic and atraumatic.  Neck: Normal range of motion. Neck supple.  Cardiovascular: Normal rate and regular rhythm.   Pulmonary/Chest: Effort normal and breath sounds normal.  Musculoskeletal: She exhibits edema.  Normal Muscle Bulk and Muscle Testing Reveals: Upper Extremities: Full ROM and Muscle Strength 5/5 Thoracic Hypersensitivity Lumbar Paraspinal Tenderness: L-3-L-5 Bilateral Greater Trochanter Tenderness Lower Extremities: Full ROM and Muscle Strength 5/5 Bilateral Lower Extremities Flexion Produces Pain into Bilateral Patellas Arises from Table Slowly using 3 prong cane  Antalgic Gait  Neurological: She is alert and oriented to person,  place, and time.  Skin: Skin is warm and dry.  Psychiatric: She has a normal mood and affect.  Nursing note and vitals reviewed.         Assessment & Plan:  1.Chronic Low Back Pain/ Lumbar Facet Arthropathy: 07/14/2016 Refilled: Oxycodone 15 mg one tablet every 6 hours as needed #120 and MS Contin 100 mg one tablet every 12 hours #60.  Continue Gabapentin and Pamelor We will continue the opioid monitoring program, this consists of regular clinic visits, examinations, urine drug screen, pill counts as well as use of [place] Controlled Substance Reporting System. 2. Degenerative Disk Disease:Continue exercise, and heat therapy. Continue Current Medication Regime.07/14/2016 3. Bilateral Greater Trochanteric Bursitis: Continue with Heat and Ice Therapy. 07/14/2016 4. Osteoarthritis of Bilateral Knees: Continue exercise and heat therapy. 07/14/2016 5. Migraine Headaches: On Maxalt. 07/14/2016 6. Smoking Cessation: She has quit smoking since 07/14/2016 7. Constipation: Continue Linzess. 07/14/2016 8. Muscle Spasm: Continue Tizanidine.07/14/2016 9. DepressionAnxiety/ Panic Attacks: Continue Celexa and  Counseling with Dr. Sima Matas. Psychology Following. 07/14/2016 10. RSD: Continue with current  medication Regime. 07/14/2016  20 minutes of face to face patient care time was spent during this visit. All questions were encouraged and answered.    F/U in 77month

## 2016-07-14 NOTE — Telephone Encounter (Signed)
On 07/14/2016 the  [place] was reviewed no conflict was seen on the [place] with multiple prescribers. Ms. Feltes has a signed narcotic contract with our office. If there were any discrepancies this would have been reported to her physician.

## 2016-07-18 ENCOUNTER — Telehealth: Payer: Self-pay | Admitting: Family Medicine

## 2016-07-18 NOTE — Telephone Encounter (Signed)
Left pt a message to call office to schedule a Thyroid  F/u appt with Dr.metheney

## 2016-07-20 ENCOUNTER — Telehealth: Payer: Self-pay | Admitting: *Deleted

## 2016-07-20 LAB — 6-ACETYLMORPHINE,TOXASSURE ADD
6-ACETYLMORPHINE: NEGATIVE
6-acetylmorphine: NOT DETECTED ng/mg creat

## 2016-07-20 LAB — TOXASSURE SELECT,+ANTIDEPR,UR

## 2016-07-20 NOTE — Telephone Encounter (Signed)
Urine drug screen for this encounter is consistent for prescribed medication 

## 2016-07-30 ENCOUNTER — Other Ambulatory Visit: Payer: Self-pay | Admitting: Physical Medicine & Rehabilitation

## 2016-07-31 ENCOUNTER — Other Ambulatory Visit: Payer: Self-pay | Admitting: Family Medicine

## 2016-08-01 ENCOUNTER — Encounter: Payer: Medicare Other | Admitting: Psychology

## 2016-08-09 ENCOUNTER — Other Ambulatory Visit: Payer: Self-pay | Admitting: Physical Medicine & Rehabilitation

## 2016-08-09 ENCOUNTER — Other Ambulatory Visit: Payer: Self-pay | Admitting: Registered Nurse

## 2016-08-11 ENCOUNTER — Encounter: Payer: Medicare Other | Admitting: Family Medicine

## 2016-08-25 ENCOUNTER — Encounter: Payer: Self-pay | Admitting: Registered Nurse

## 2016-08-25 ENCOUNTER — Encounter: Payer: Medicare Other | Attending: Physical Medicine and Rehabilitation | Admitting: Registered Nurse

## 2016-08-25 VITALS — BP 103/72 | HR 93

## 2016-08-25 DIAGNOSIS — G43119 Migraine with aura, intractable, without status migrainosus: Secondary | ICD-10-CM

## 2016-08-25 DIAGNOSIS — M1712 Unilateral primary osteoarthritis, left knee: Secondary | ICD-10-CM

## 2016-08-25 DIAGNOSIS — M4696 Unspecified inflammatory spondylopathy, lumbar region: Secondary | ICD-10-CM | POA: Diagnosis not present

## 2016-08-25 DIAGNOSIS — Z5181 Encounter for therapeutic drug level monitoring: Secondary | ICD-10-CM | POA: Diagnosis not present

## 2016-08-25 DIAGNOSIS — M791 Myalgia: Secondary | ICD-10-CM | POA: Diagnosis not present

## 2016-08-25 DIAGNOSIS — M1711 Unilateral primary osteoarthritis, right knee: Secondary | ICD-10-CM | POA: Diagnosis not present

## 2016-08-25 DIAGNOSIS — G9050 Complex regional pain syndrome I, unspecified: Secondary | ICD-10-CM | POA: Diagnosis not present

## 2016-08-25 DIAGNOSIS — Z760 Encounter for issue of repeat prescription: Secondary | ICD-10-CM | POA: Diagnosis not present

## 2016-08-25 DIAGNOSIS — F411 Generalized anxiety disorder: Secondary | ICD-10-CM | POA: Diagnosis not present

## 2016-08-25 DIAGNOSIS — M7918 Myalgia, other site: Secondary | ICD-10-CM

## 2016-08-25 DIAGNOSIS — G894 Chronic pain syndrome: Secondary | ICD-10-CM

## 2016-08-25 DIAGNOSIS — F329 Major depressive disorder, single episode, unspecified: Secondary | ICD-10-CM | POA: Diagnosis not present

## 2016-08-25 DIAGNOSIS — M47816 Spondylosis without myelopathy or radiculopathy, lumbar region: Secondary | ICD-10-CM

## 2016-08-25 DIAGNOSIS — Z79899 Other long term (current) drug therapy: Secondary | ICD-10-CM | POA: Diagnosis not present

## 2016-08-25 MED ORDER — OXYCODONE HCL 15 MG PO TABS: 15.0000 mg | ORAL_TABLET | Freq: Four times a day (QID) | ORAL | 0 refills | Status: DC | PRN

## 2016-08-25 MED ORDER — MORPHINE SULFATE ER 100 MG PO TBCR: 100.0000 mg | EXTENDED_RELEASE_TABLET | Freq: Two times a day (BID) | ORAL | 0 refills | Status: DC

## 2016-08-25 NOTE — Progress Notes (Signed)
Subjective:    Patient ID: [name], female    DOB: 1960/06/18, 56 y.o.   MRN: [id number]  HPI:  [name] is a 56 year old female who returns for follow up appointmentfor chronic pain and medication refill. She states her pain is located in her lower back, bilateral hipsand bilateral knees R>L. She rates her pain 7.Her current exercise regime is using her simply fit board three days a week, performing chair exercises every other day,using stretching bands and walking short distances with her cane.  [name] states she fell two months ago, she was walking in front of her home and lost her footing. She fell on her buttocks her husband and neighbor helped her up, she didn't seek medical attention.   [name] last UDS was performed on 07/14/2016, it was consistent.   Pain Inventory Average Pain 9 Pain Right Now 8 My pain is constant, sharp, burning, dull, stabbing, tingling and aching  In the last 24 hours, has pain interfered with the following? General activity 9 Relation with others 9 Enjoyment of life 10 What TIME of day is your pain at its worst? all Sleep (in general) Poor  Pain is worse with: walking, bending, standing and some activites Pain improves with: rest, heat/ice, therapy/exercise, medication, TENS and injections Relief from Meds: 6  Mobility use a cane use a walker how many minutes can you walk? 5-7 ability to climb steps?  yes do you drive?  yes  Function disabled: date disabled 2007 I need assistance with the following:  bathing, meal prep, household duties and shopping  Neuro/Psych weakness numbness tremor tingling trouble walking spasms dizziness confusion depression anxiety  Prior Studies Any changes since last visit?  no  Physicians involved in your care Any changes since last visit?  no   Family History  Problem Relation Age of Onset  . Depression Mother   . Hypertension Mother   .  Hypertension Father   . Heart failure Father   . Diabetes Father   . Heart attack Father   . Colon cancer Neg Hx   . Esophageal cancer Neg Hx   . Stomach cancer Neg Hx   . Rectal cancer Neg Hx    Social History   Social History  . Marital status: Married    Spouse name: [name]  . Number of children: 1  . Years of education: some colle   Occupational History  . On disability Unemployed   Social History Main Topics  . Smoking status: Former Smoker    Packs/day: 0.50    Types: Cigarettes    Quit date: 02/06/2016  . Smokeless tobacco: Never Used     Comment: going to try e-sticks to quit  . Alcohol use No  . Drug use: No  . Sexual activity: Not Currently    Partners: Male   Other Topics Concern  . None   Social History Narrative   8-10 cups of soda a day.  On disability. No regular exercise.     Past Surgical History:  Procedure Laterality Date  . ABDOMINAL HYSTERECTOMY  May 2004  . Basel Cell Carcinoma  06/2005, 07/2005   nasal tip and reconstruction  . CARPAL TUNNEL RELEASE  07/1993   both hands  . COLONOSCOPY    . HEMORROIDECTOMY  July 2003  . KNEE ARTHROPLASTY  09/2001, 05/2003   left knee, chondromalasia  . KNEE ARTHROSCOPY  jan 2005   Right knee, tisse release, chrondromalacia  . REPLACEMENT  TOTAL KNEE  Nov 2005   Left   . THYROID LOBECTOMY  01/2005   malignant area removed from right lobe  . THYROID SURGERY     thyroid lobe removal  . TONSILLECTOMY  09/1972  . TUBAL LIGATION  Sept 1999  . Ulnar nerve entrapment  Feb 1995   left elbow   Past Medical History:  Diagnosis Date  . Allergy   . Anxiety   . Cancer ([place])    cancerous nodules on thyroid  . Cancer ([place])    basal cell Cancer-nose  . Chronic pain syndrome   . COPD (chronic obstructive pulmonary disease) ([place])   . Degeneration of lumbar or lumbosacral intervertebral disc   . Depression   . Facet syndrome, lumbar ([place])   . GERD (gastroesophageal reflux disease)   . Hyperlipidemia   .  Intervertebral lumbar disc disorder with myelopathy, lumbar region   . Lumbosacral spondylosis without myelopathy   . Migraine without aura, with intractable migraine, so stated, without mention of status migrainosus   . Primary localized osteoarthrosis, lower leg   . Thyroid disease    hypothyroid  . Unspecified musculoskeletal disorders and symptoms referable to neck    cervical/trapezius   BP 103/72   Pulse 93   SpO2 94%   Opioid Risk Score:  5 Fall Risk Score:  `1  Depression screen PHQ 2/9  Depression screen [facility] 2/9 08/25/2016 07/14/2016 11/10/2015 09/06/2015 03/16/2015 09/17/2014 04/21/2014  Decreased Interest 3 3 3 3 3 3 3   Down, Depressed, Hopeless 3 3 3  - 3 3 -  PHQ - 2 Score 6 6 6 3 6 6 3   Altered sleeping - - - - - - 3  Tired, decreased energy - - - - - - 3  Change in appetite - - - - - - 3  Feeling bad or failure about yourself  - - - - - - 2  Trouble concentrating - - - - - - 2  Moving slowly or fidgety/restless - - - - - - 1  Suicidal thoughts - - - - - - 1  PHQ-9 Score - - - - - - 18    Review of Systems  Constitutional: Positive for appetite change, diaphoresis and unexpected weight change.  HENT: Negative.   Eyes: Negative.   Cardiovascular: Positive for leg swelling.  Gastrointestinal: Positive for nausea and vomiting.  Endocrine: Negative.   Genitourinary: Negative.   Musculoskeletal: Positive for gait problem and joint swelling.       Spasms  Skin: Negative.   Allergic/Immunologic: Negative.   Neurological: Positive for dizziness, tremors and numbness.       Tingling  Hematological: Negative.   Psychiatric/Behavioral: Positive for confusion and dysphoric mood. The patient is nervous/anxious.        Panic attacks  All other systems reviewed and are negative.      Objective:   Physical Exam  Constitutional: She is oriented to person, place, and time. She appears well-developed and well-nourished.  HENT:  Head: Normocephalic and atraumatic.  Neck:  Normal range of motion. Neck supple.  Cardiovascular: Normal rate and regular rhythm.   Pulmonary/Chest: Effort normal and breath sounds normal.  Musculoskeletal:  Normal Muscle Bulk and Muscle Testing Reveals: Upper Extremities: Full ROM and Muscle Strength 5/5 Bilateral AC Joint Tenderness Lumbar Paraspinal Tenderness: L-3-L-5 Bilateral Greater Trochanter Tenderness Lower Extremities: Right: Decreased ROM and  Muscle Strength  5/5 Right Lower Extremity Flexion Produces Pain into Patella Left: Full ROM and Muscle Strength  5/5 Arises from Table Slowly using Cane for support Antalgic gait  Neurological: She is alert and oriented to person, place, and time.  Skin: Skin is warm and dry.  Psychiatric: She has a normal mood and affect.  Nursing note and vitals reviewed.         Assessment & Plan:  1.Chronic Low Back Pain/ Lumbar Facet Arthropathy: 08/25/2016 Refilled: Oxycodone 15 mg one tablet every 6 hours as needed #120 and MS Contin 100 mg one tablet every 12 hours #60. Second script given for the following month. Continue Gabapentin and Pamelor We will continue the opioid monitoring program, this consists of regular clinic visits, examinations, urine drug screen, pill counts as well as use of [place] Controlled Substance Reporting System. 2. Degenerative Disk Disease:Continue exercise, and heat therapy. Continue Current Medication Regime.08/25/2016 3. Bilateral Greater Trochanteric Bursitis: Continue with Heat and Ice Therapy. 08/25/2016 4. Osteoarthritis of Bilateral Knees: Continue exercise and heat therapy. 08/25/2016 5. Migraine Headaches: On Maxalt. 08/25/2016 6. Smoking Cessation: She has quit smoking since 08/25/2016 7. Constipation: Continue Linzess. 08/25/2016 8. Muscle Spasm: Continue Tizanidine.08/25/2016 9. DepressionAnxiety/ Panic Attacks: Continue Celexaand  Counseling with Dr. Sima Matas. Psychology Following. 08/25/2016 10. RSD: Continue with current  medication Regime. 08/25/2016  20 minutes of face to face patient care time was spent during this visit. All questions were encouraged and answered.     F/U in 36month

## 2016-08-28 DIAGNOSIS — D2311 Other benign neoplasm of skin of right eyelid, including canthus: Secondary | ICD-10-CM | POA: Diagnosis not present

## 2016-08-28 DIAGNOSIS — H527 Unspecified disorder of refraction: Secondary | ICD-10-CM | POA: Diagnosis not present

## 2016-08-28 DIAGNOSIS — H04129 Dry eye syndrome of unspecified lacrimal gland: Secondary | ICD-10-CM | POA: Diagnosis not present

## 2016-08-28 DIAGNOSIS — D2312 Other benign neoplasm of skin of left eyelid, including canthus: Secondary | ICD-10-CM | POA: Diagnosis not present

## 2016-08-28 DIAGNOSIS — H25813 Combined forms of age-related cataract, bilateral: Secondary | ICD-10-CM | POA: Diagnosis not present

## 2016-09-08 ENCOUNTER — Encounter: Payer: Medicare Other | Admitting: Psychology

## 2016-09-21 ENCOUNTER — Other Ambulatory Visit: Payer: Self-pay | Admitting: Family Medicine

## 2016-09-29 ENCOUNTER — Encounter: Payer: Self-pay | Admitting: Psychology

## 2016-09-29 ENCOUNTER — Encounter: Payer: Medicare Other | Attending: Physical Medicine and Rehabilitation | Admitting: Psychology

## 2016-09-29 ENCOUNTER — Telehealth: Payer: Self-pay

## 2016-09-29 DIAGNOSIS — F411 Generalized anxiety disorder: Secondary | ICD-10-CM | POA: Diagnosis not present

## 2016-09-29 DIAGNOSIS — Z760 Encounter for issue of repeat prescription: Secondary | ICD-10-CM | POA: Insufficient documentation

## 2016-09-29 DIAGNOSIS — G894 Chronic pain syndrome: Secondary | ICD-10-CM | POA: Diagnosis not present

## 2016-09-29 DIAGNOSIS — G9050 Complex regional pain syndrome I, unspecified: Secondary | ICD-10-CM | POA: Diagnosis not present

## 2016-09-29 NOTE — Telephone Encounter (Signed)
Prior authorization initiated for this patient on cover my meds

## 2016-09-29 NOTE — Telephone Encounter (Signed)
Prior authorization has returned as approved from patients insurance

## 2016-09-29 NOTE — Progress Notes (Signed)
Patient:  [name]   DOB: [birth year]  MR Number: [id number]  Location: [facility] [address], [place] [id number] [place]  [address] Dept: [phone]  Start: 8 AM End: 9 AM  Provider/Observer:     Edgardo Roys PSYD  Chief Complaint:      Chief Complaint  Patient presents with  . Anxiety  . Depression  . Pain  . Stress    Reason For Service:     The patient is a 56 year old female that was referred for psychotherapeutic interventions because of issues related to panic attacks, anxiety, rash the patient has been dealing with chronic pain as well as cluster type migraine as well. The patient describes panic attacks as having a racing heart, becoming and feeling clammy and sweaty, experiencing tremors as well as. The patient experiences chronic pain in her shoulders, knees, hips, and low back. She reports that her migraine headaches are having a longer and longer duration going on for years.  The patient reports that she has been diagnosed with degenerative disc in her back as well as degenerative cartilage in her knees. She is also subsequently developed RSD. She reports that Dr. Maureen Ralphs performed a partial knee replacement in the left knee but feels he cannot do more about it. The patient reports that her sleep is very disturbed and broken up during the evening. She reports that she gets about 2 and half hours of sleep each night and will sleep some during the day or night. She feels like she is a "vampire" for being up at night all the time. The patient reports that she is able to do many daily activities such as cooking and that her husband gets by just by eating frozen meals.  Interventions Strategy:  Cognitive/behavioral psychotherapeutic interventions along with building coping skills and strategies and other adaptive techniques around issues related to RSD,  depression, and anxiety.  Participation Level:   Active  Participation Quality:  Appropriate      Behavioral Observation:  Well Groomed, Alert, and Tearful.   Current Psychosocial Factors: The patient reports that she has been able to see her grandkids some over the summer.  She has had a lot of pain and while she loves cooking it is really hard to do cleaning etc.  Having more HAs has limiting her in doing things  Content of Session:   Reviewed current symptoms and work on therapeutic interventions are in issues related to her stressors around her marriage, medical issues, memory and cognitive changes etc.  Current Status:   The patient reports that headaches have been of greater duration.  She has also fallen in past month and this caused soft tissue injury..   Patient Progress:   Stable  Target Goals:   Target goals include working better adaptive strategies.  Last Reviewed:   09/29/2016  Impression/Diagnosis:   The patient has a long history of significant depression anxiety as well as panic attacks. She is ongoing issues with RSD and chronic pain to she has had numerous orthopedic issues throughout her life. The patient has started taking Celexa which was prescribed by Dr. Tessa Lerner to help with pain and depression. She reports that it is helping some and she does not have as many crying spells  Diagnosis:   RSD (reflex sympathetic dystrophy)  Chronic pain syndrome  Generalized anxiety disorder

## 2016-09-29 NOTE — Telephone Encounter (Signed)
[facility] pharmacy called needing prior Auth for MS cottin

## 2016-10-25 ENCOUNTER — Encounter: Payer: Self-pay | Admitting: Physical Medicine & Rehabilitation

## 2016-10-25 ENCOUNTER — Encounter: Payer: Medicare Other | Attending: Physical Medicine and Rehabilitation | Admitting: Physical Medicine & Rehabilitation

## 2016-10-25 DIAGNOSIS — M7918 Myalgia, other site: Secondary | ICD-10-CM

## 2016-10-25 DIAGNOSIS — Z760 Encounter for issue of repeat prescription: Secondary | ICD-10-CM | POA: Diagnosis not present

## 2016-10-25 DIAGNOSIS — Z79899 Other long term (current) drug therapy: Secondary | ICD-10-CM

## 2016-10-25 DIAGNOSIS — Z5181 Encounter for therapeutic drug level monitoring: Secondary | ICD-10-CM

## 2016-10-25 DIAGNOSIS — M791 Myalgia: Secondary | ICD-10-CM

## 2016-10-25 DIAGNOSIS — G894 Chronic pain syndrome: Secondary | ICD-10-CM | POA: Diagnosis not present

## 2016-10-25 MED ORDER — MORPHINE SULFATE ER 100 MG PO TBCR: 100.0000 mg | EXTENDED_RELEASE_TABLET | Freq: Two times a day (BID) | ORAL | 0 refills | Status: DC

## 2016-10-25 MED ORDER — OXYCODONE HCL 15 MG PO TABS: 15.0000 mg | ORAL_TABLET | Freq: Four times a day (QID) | ORAL | 0 refills | Status: DC | PRN

## 2016-10-25 NOTE — Progress Notes (Signed)
PROCEDURE:  After informed consent and preparation of the skin with isopropyl alcohol, I injected the left mid traps (2 sites) each with 2cc of 1% lidocaine. The patient tolerated well, and no complications were experienced. Post-injection instructions were provided.   Meredith Staggers, MD, [facility] 10/25/2016

## 2016-10-25 NOTE — Patient Instructions (Signed)
PLEASE FEEL FREE TO CALL OUR OFFICE WITH ANY PROBLEMS OR QUESTIONS ([phone])      

## 2016-11-09 ENCOUNTER — Encounter: Payer: Medicare Other | Admitting: Psychology

## 2016-11-15 ENCOUNTER — Other Ambulatory Visit: Payer: Self-pay | Admitting: Physical Medicine & Rehabilitation

## 2016-11-22 ENCOUNTER — Other Ambulatory Visit: Payer: Self-pay | Admitting: Physical Medicine & Rehabilitation

## 2016-11-27 ENCOUNTER — Other Ambulatory Visit: Payer: Self-pay | Admitting: Physical Medicine & Rehabilitation

## 2016-11-28 NOTE — Telephone Encounter (Signed)
Recieved electronic medication refill request for promethazine, last time this medication was prescribed was 05-01-2016 with 2 refills, no mention in previous 3 notes to continue this medication, please advise

## 2016-12-12 ENCOUNTER — Other Ambulatory Visit: Payer: Self-pay | Admitting: *Deleted

## 2016-12-12 MED ORDER — PROMETHAZINE HCL 12.5 MG PO TABS: 12.5000 mg | ORAL_TABLET | Freq: Two times a day (BID) | ORAL | 2 refills | Status: DC

## 2016-12-20 ENCOUNTER — Other Ambulatory Visit: Payer: Self-pay | Admitting: Family Medicine

## 2016-12-25 ENCOUNTER — Encounter: Payer: Self-pay | Admitting: Registered Nurse

## 2016-12-25 ENCOUNTER — Encounter: Payer: Medicare Other | Attending: Physical Medicine and Rehabilitation | Admitting: Registered Nurse

## 2016-12-25 ENCOUNTER — Other Ambulatory Visit: Payer: Self-pay

## 2016-12-25 VITALS — BP 115/72 | HR 96 | Resp 14

## 2016-12-25 DIAGNOSIS — M47816 Spondylosis without myelopathy or radiculopathy, lumbar region: Secondary | ICD-10-CM | POA: Diagnosis not present

## 2016-12-25 DIAGNOSIS — F329 Major depressive disorder, single episode, unspecified: Secondary | ICD-10-CM | POA: Diagnosis not present

## 2016-12-25 DIAGNOSIS — M7062 Trochanteric bursitis, left hip: Secondary | ICD-10-CM

## 2016-12-25 DIAGNOSIS — G43119 Migraine with aura, intractable, without status migrainosus: Secondary | ICD-10-CM | POA: Diagnosis not present

## 2016-12-25 DIAGNOSIS — G8929 Other chronic pain: Secondary | ICD-10-CM

## 2016-12-25 DIAGNOSIS — G894 Chronic pain syndrome: Secondary | ICD-10-CM | POA: Diagnosis not present

## 2016-12-25 DIAGNOSIS — M7918 Myalgia, other site: Secondary | ICD-10-CM

## 2016-12-25 DIAGNOSIS — F411 Generalized anxiety disorder: Secondary | ICD-10-CM

## 2016-12-25 DIAGNOSIS — Z760 Encounter for issue of repeat prescription: Secondary | ICD-10-CM | POA: Insufficient documentation

## 2016-12-25 DIAGNOSIS — Z79899 Other long term (current) drug therapy: Secondary | ICD-10-CM

## 2016-12-25 DIAGNOSIS — M7061 Trochanteric bursitis, right hip: Secondary | ICD-10-CM

## 2016-12-25 DIAGNOSIS — M1711 Unilateral primary osteoarthritis, right knee: Secondary | ICD-10-CM | POA: Diagnosis not present

## 2016-12-25 DIAGNOSIS — M1712 Unilateral primary osteoarthritis, left knee: Secondary | ICD-10-CM

## 2016-12-25 DIAGNOSIS — Z5181 Encounter for therapeutic drug level monitoring: Secondary | ICD-10-CM

## 2016-12-25 DIAGNOSIS — M546 Pain in thoracic spine: Secondary | ICD-10-CM

## 2016-12-25 DIAGNOSIS — M5136 Other intervertebral disc degeneration, lumbar region: Secondary | ICD-10-CM

## 2016-12-25 DIAGNOSIS — G9050 Complex regional pain syndrome I, unspecified: Secondary | ICD-10-CM

## 2016-12-25 MED ORDER — OXYCODONE HCL 15 MG PO TABS: 15.0000 mg | ORAL_TABLET | Freq: Four times a day (QID) | ORAL | 0 refills | Status: DC | PRN

## 2016-12-25 MED ORDER — MORPHINE SULFATE ER 100 MG PO TBCR: 100.0000 mg | EXTENDED_RELEASE_TABLET | Freq: Two times a day (BID) | ORAL | 0 refills | Status: DC

## 2016-12-25 MED ORDER — NORTRIPTYLINE HCL 50 MG PO CAPS: 100.0000 mg | ORAL_CAPSULE | Freq: Every day | ORAL | 2 refills | Status: DC

## 2016-12-25 NOTE — Progress Notes (Signed)
Subjective:    Patient ID: [name], female    DOB: [birth year], 58 y.o.   MRN: [id number]  HPI:  [name] is a 56 year old female who returns for follow up appointmentfor chronic pain and medication refill. She states her pain is located in her mid-lower back radiating into her lower extremities, bilateral hipsand bilateral knees R>L. She rates her pain 8.Her current exercise regime is using her simply fit board three days a week, performing chair exercises every other day,using stretching bands and walking short distances with her cane.  [name] Morphine equivalent is  290.00MME, we reviewed the PMP Aware  Web-site Reviewed.   [name] last UDS was performed on 07/14/2016, it was consistent.   Pain Inventory Average Pain 9 Pain Right Now 8 My pain is constant, sharp, burning, dull, stabbing, tingling and aching  In the last 24 hours, has pain interfered with the following? General activity 9 Relation with others 8 Enjoyment of life 9 What TIME of day is your pain at its worst? all Sleep (in general) Poor  Pain is worse with: walking, bending, standing and some activites Pain improves with: rest, heat/ice, therapy/exercise, medication, TENS and injections Relief from Meds: 6  Mobility walk with assistance use a cane use a walker how many minutes can you walk? 5-7 ability to climb steps?  yes do you drive?  yes Do you have any goals in this area?  yes  Function not employed: date last employed . disabled: date disabled . I need assistance with the following:  bathing, meal prep, household duties and shopping Do you have any goals in this area?  yes  Neuro/Psych weakness numbness tremor tingling trouble walking spasms dizziness confusion depression anxiety  Prior Studies Any changes since last visit?  no  Physicians involved in your care Any changes since last visit?  no   Family History  Problem Relation Age of  Onset  . Depression Mother   . Hypertension Mother   . Hypertension Father   . Heart failure Father   . Diabetes Father   . Heart attack Father   . Colon cancer Neg Hx   . Esophageal cancer Neg Hx   . Stomach cancer Neg Hx   . Rectal cancer Neg Hx    Social History   Socioeconomic History  . Marital status: Married    Spouse name: [name]  . Number of children: 1  . Years of education: some colle  . Highest education level: None  Social Needs  . Financial resource strain: None  . Food insecurity - worry: None  . Food insecurity - inability: None  . Transportation needs - medical: None  . Transportation needs - non-medical: None  Occupational History  . Occupation: On disability    Employer: UNEMPLOYED  Tobacco Use  . Smoking status: Former Smoker    Packs/day: 0.50    Types: Cigarettes    Last attempt to quit: 02/06/2016    Years since quitting: 0.8  . Smokeless tobacco: Never Used  . Tobacco comment: going to try e-sticks to quit  Substance and Sexual Activity  . Alcohol use: No    Alcohol/week: 0.0 oz  . Drug use: No  . Sexual activity: Not Currently    Partners: Male  Other Topics Concern  . None  Social History Narrative   8-10 cups of soda a day.  On disability. No regular exercise.     Past Surgical History:  Procedure Laterality  Date  . ABDOMINAL HYSTERECTOMY  May 2004  . Basel Cell Carcinoma  06/2005, 07/2005   nasal tip and reconstruction  . CARPAL TUNNEL RELEASE  07/1993   both hands  . COLONOSCOPY    . HEMORROIDECTOMY  July 2003  . KNEE ARTHROPLASTY  09/2001, 05/2003   left knee, chondromalasia  . KNEE ARTHROSCOPY  jan 2005   Right knee, tisse release, chrondromalacia  . REPLACEMENT TOTAL KNEE  Nov 2005   Left   . THYROID LOBECTOMY  01/2005   malignant area removed from right lobe  . THYROID SURGERY     thyroid lobe removal  . TONSILLECTOMY  09/1972  . TUBAL LIGATION  Sept 1999  . Ulnar nerve entrapment  Feb 1995   left elbow   Past Medical  History:  Diagnosis Date  . Allergy   . Anxiety   . Cancer ([place])    cancerous nodules on thyroid  . Cancer ([place])    basal cell Cancer-nose  . Chronic pain syndrome   . COPD (chronic obstructive pulmonary disease) ([place])   . Degeneration of lumbar or lumbosacral intervertebral disc   . Depression   . Facet syndrome, lumbar   . GERD (gastroesophageal reflux disease)   . Hyperlipidemia   . Intervertebral lumbar disc disorder with myelopathy, lumbar region   . Lumbosacral spondylosis without myelopathy   . Migraine without aura, with intractable migraine, so stated, without mention of status migrainosus   . Primary localized osteoarthrosis, lower leg   . Thyroid disease    hypothyroid  . Unspecified musculoskeletal disorders and symptoms referable to neck    cervical/trapezius   There were no vitals taken for this visit.  Opioid Risk Score:  5 Fall Risk Score:  `1  Depression screen PHQ 2/9  Depression screen [facility] 2/9 10/25/2016 08/25/2016 07/14/2016 11/10/2015 09/06/2015 03/16/2015 09/17/2014  Decreased Interest 3 3 3 3 3 3 3   Down, Depressed, Hopeless 3 3 3 3  - 3 3  PHQ - 2 Score 6 6 6 6 3 6 6   Altered sleeping - - - - - - -  Tired, decreased energy - - - - - - -  Change in appetite - - - - - - -  Feeling bad or failure about yourself  - - - - - - -  Trouble concentrating - - - - - - -  Moving slowly or fidgety/restless - - - - - - -  Suicidal thoughts - - - - - - -  PHQ-9 Score - - - - - - -    Review of Systems  Constitutional: Positive for appetite change, diaphoresis and unexpected weight change.  HENT: Negative.   Eyes: Negative.   Cardiovascular: Positive for leg swelling.  Gastrointestinal: Positive for nausea and vomiting.  Endocrine: Negative.   Genitourinary: Negative.   Musculoskeletal: Positive for arthralgias, back pain, gait problem, joint swelling and neck stiffness. Negative for neck pain.       Spasms  Skin: Negative.   Allergic/Immunologic: Negative.     Neurological: Positive for dizziness, tremors and numbness.       Tingling  Hematological: Negative.   Psychiatric/Behavioral: Positive for confusion and dysphoric mood. The patient is nervous/anxious.        Panic attacks  All other systems reviewed and are negative.      Objective:   Physical Exam  Constitutional: She is oriented to person, place, and time. She appears well-developed and well-nourished.  HENT:  Head: Normocephalic and atraumatic.  Neck: Normal range of motion. Neck supple.  Cardiovascular: Normal rate and regular rhythm.  Pulmonary/Chest: Effort normal and breath sounds normal.  Musculoskeletal:  Normal Muscle Bulk and Muscle Testing Reveals: Upper Extremities: Full ROM and Muscle Strength 5/5 Thoracic Paraspinal Tenderness: T-1-T-3 T-7-T-9 Lumbar Hypersensitivity Bilateral Greater Trochanter Tenderness R>L Lower Extremities: Right: Full  ROM and  Muscle Strength  5/5 Left: Decreased  ROM and Muscle Strength 5/5 Arises from Table Slowly using Cane for support Antalgic gait  Neurological: She is alert and oriented to person, place, and time.  Skin: Skin is warm and dry.  Psychiatric: She has a normal mood and affect.  Nursing note and vitals reviewed.         Assessment & Plan:  1.Chronic Bilateral Thoracic Back Pain/ Low Back Pain/ Lumbar Facet Arthropathy: 12/25/2016 Refilled: Oxycodone 15 mg one tablet every 6 hours as needed #120 and MS Contin 100 mg one tablet every 12 hours #60. Second script given for the following month. Continue Gabapentin and Pamelor We will continue the opioid monitoring program, this consists of regular clinic visits, examinations, urine drug screen, pill counts as well as use of [place] Controlled Substance Reporting System. 2. Degenerative Disk Disease:Continue exercise, and heat therapy. Continue Current Medication Regime.12/25/2016 3. Bilateral Greater Trochanteric Bursitis: Continue with Heat and Ice Therapy.  12/25/2016 4. Osteoarthritis of Bilateral Knees: Continue exercise and heat therapy. 12/25/2016 5. Migraine Headaches: Continue Maxalt. 12/25/2016 6. Smoking Cessation: She has quit smoking since 08/25/2016. Continue to Monitor 7. Constipation: Continue Linzess. 12/25/2016 8. Muscle Spasm: Continue Tizanidine.12/25/2016 9. DepressionAnxiety/ Panic Attacks: Continue Celexaand  Counseling with Dr. Sima Matas. Psychology Following. 12/25/2016 10. RSD: Continue with current medication Regime. 12/25/2016  20 minutes of face to face patient care time was spent during this visit. All questions were encouraged and answered.   F/U in 70months

## 2017-01-01 ENCOUNTER — Telehealth: Payer: Self-pay | Admitting: Registered Nurse

## 2017-01-01 NOTE — Telephone Encounter (Signed)
On 01/01/2017 the  [place] was reviewed no conflict was seen on the [place] with multiple prescribers. Ms. Poirier has a signed narcotic contract with our office. If there were any discrepancies this would have been reported to her physician.

## 2017-01-17 ENCOUNTER — Other Ambulatory Visit: Payer: Self-pay | Admitting: Physical Medicine & Rehabilitation

## 2017-01-19 ENCOUNTER — Encounter: Payer: Medicare Other | Admitting: Psychology

## 2017-02-12 ENCOUNTER — Other Ambulatory Visit: Payer: Self-pay | Admitting: Family Medicine

## 2017-02-12 ENCOUNTER — Other Ambulatory Visit: Payer: Self-pay | Admitting: Physical Medicine & Rehabilitation

## 2017-02-21 ENCOUNTER — Telehealth: Payer: Self-pay | Admitting: Family Medicine

## 2017-02-21 NOTE — Telephone Encounter (Signed)
Called pt and left a message that she should call our office to schedule a f/u with Dr. Madilyn Fireman  (F/U on Thyroid)

## 2017-02-26 ENCOUNTER — Encounter: Payer: Self-pay | Admitting: Registered Nurse

## 2017-02-26 ENCOUNTER — Telehealth: Payer: Self-pay

## 2017-02-26 ENCOUNTER — Encounter: Payer: Medicare Other | Attending: Physical Medicine and Rehabilitation | Admitting: Registered Nurse

## 2017-02-26 VITALS — BP 126/84 | HR 87

## 2017-02-26 DIAGNOSIS — Z760 Encounter for issue of repeat prescription: Secondary | ICD-10-CM | POA: Insufficient documentation

## 2017-02-26 DIAGNOSIS — Z79899 Other long term (current) drug therapy: Secondary | ICD-10-CM

## 2017-02-26 DIAGNOSIS — F329 Major depressive disorder, single episode, unspecified: Secondary | ICD-10-CM | POA: Diagnosis not present

## 2017-02-26 DIAGNOSIS — M5136 Other intervertebral disc degeneration, lumbar region: Secondary | ICD-10-CM

## 2017-02-26 DIAGNOSIS — G8929 Other chronic pain: Secondary | ICD-10-CM

## 2017-02-26 DIAGNOSIS — M1711 Unilateral primary osteoarthritis, right knee: Secondary | ICD-10-CM

## 2017-02-26 DIAGNOSIS — M546 Pain in thoracic spine: Secondary | ICD-10-CM

## 2017-02-26 DIAGNOSIS — M7062 Trochanteric bursitis, left hip: Secondary | ICD-10-CM

## 2017-02-26 DIAGNOSIS — Z5181 Encounter for therapeutic drug level monitoring: Secondary | ICD-10-CM | POA: Diagnosis not present

## 2017-02-26 DIAGNOSIS — G43119 Migraine with aura, intractable, without status migrainosus: Secondary | ICD-10-CM

## 2017-02-26 DIAGNOSIS — M1712 Unilateral primary osteoarthritis, left knee: Secondary | ICD-10-CM

## 2017-02-26 DIAGNOSIS — M7918 Myalgia, other site: Secondary | ICD-10-CM | POA: Diagnosis not present

## 2017-02-26 DIAGNOSIS — M7061 Trochanteric bursitis, right hip: Secondary | ICD-10-CM

## 2017-02-26 DIAGNOSIS — G894 Chronic pain syndrome: Secondary | ICD-10-CM | POA: Diagnosis not present

## 2017-02-26 DIAGNOSIS — M47816 Spondylosis without myelopathy or radiculopathy, lumbar region: Secondary | ICD-10-CM | POA: Diagnosis not present

## 2017-02-26 DIAGNOSIS — M51369 Other intervertebral disc degeneration, lumbar region without mention of lumbar back pain or lower extremity pain: Secondary | ICD-10-CM

## 2017-02-26 MED ORDER — NORTRIPTYLINE HCL 50 MG PO CAPS: 100.0000 mg | ORAL_CAPSULE | Freq: Every day | ORAL | 2 refills | Status: DC

## 2017-02-26 MED ORDER — OXYCODONE HCL 10 MG PO TABS: 10.0000 mg | ORAL_TABLET | Freq: Every day | ORAL | 0 refills | Status: DC | PRN

## 2017-02-26 MED ORDER — PROMETHAZINE HCL 12.5 MG PO TABS: 12.5000 mg | ORAL_TABLET | Freq: Two times a day (BID) | ORAL | 2 refills | Status: DC

## 2017-02-26 MED ORDER — MORPHINE SULFATE ER 100 MG PO TBCR: 100.0000 mg | EXTENDED_RELEASE_TABLET | Freq: Two times a day (BID) | ORAL | 0 refills | Status: DC

## 2017-02-26 MED ORDER — RIZATRIPTAN BENZOATE 10 MG PO TABS: ORAL_TABLET | ORAL | 2 refills | Status: DC

## 2017-02-26 MED ORDER — CITALOPRAM HYDROBROMIDE 40 MG PO TABS: 40.0000 mg | ORAL_TABLET | Freq: Every day | ORAL | 5 refills | Status: DC

## 2017-02-26 NOTE — Telephone Encounter (Addendum)
Prior auth started for oxycodone and morphine for this patient 02-26-2017

## 2017-02-26 NOTE — Telephone Encounter (Signed)
Both prior auth's came back today stating they are already completed and not needed at this time, oxy is good until 02-05-2018 and morphine not needed at this time.

## 2017-02-26 NOTE — Progress Notes (Signed)
Subjective:    Patient ID: [name], female    DOB: [birth year], 57 y.o.   MRN: [id number]  HPI:  [name] is a 57 year old female who returns for follow up appointmentfor chronic pain and medication refill. She states her pain is located in her lower back radiating into her bilateral hips L>R and left knee. She rates her pain 8. Her current exercise regime is walking, performing chair exercises using bands.   Discussed with [name] in detail regarding her pain and we reviewed her Pain level over the last few months, her analgesics and discussed her MME. Today we will decrease her Oxycodone, she became tearful, all questions answered and reassurance given. She was instructed to call office in three weeks to evaluate medication changes, she verbalizes understanding.   [name] Morphine equivalent is  309.33MME.  [name] last UDS was performed on 07/14/2016, it was consistent. Oral Swab Performed Today.   Pain Inventory Average Pain 9 Pain Right Now 8 My pain is constant, sharp, burning, dull, stabbing, tingling and aching  In the last 24 hours, has pain interfered with the following? General activity 10 Relation with others 10 Enjoyment of life 10 What TIME of day is your pain at its worst? all Sleep (in general) Poor  Pain is worse with: walking, bending, standing and some activites Pain improves with: rest, heat/ice, therapy/exercise, medication, TENS and injections Relief from Meds: 7  Mobility walk with assistance use a cane use a walker how many minutes can you walk? 5-7 ability to climb steps?  yes do you drive?  yes Do you have any goals in this area?  yes  Function not employed: date last employed . disabled: date disabled . I need assistance with the following:  bathing, meal prep, household duties and shopping Do you have any goals in this area?  yes  Neuro/Psych weakness numbness tremor tingling trouble  walking spasms dizziness confusion depression anxiety  Prior Studies Any changes since last visit?  no  Physicians involved in your care Any changes since last visit?  no   Family History  Problem Relation Age of Onset  . Depression Mother   . Hypertension Mother   . Hypertension Father   . Heart failure Father   . Diabetes Father   . Heart attack Father   . Colon cancer Neg Hx   . Esophageal cancer Neg Hx   . Stomach cancer Neg Hx   . Rectal cancer Neg Hx    Social History   Socioeconomic History  . Marital status: Married    Spouse name: [name]  . Number of children: 1  . Years of education: some colle  . Highest education level: Not on file  Social Needs  . Financial resource strain: Not on file  . Food insecurity - worry: Not on file  . Food insecurity - inability: Not on file  . Transportation needs - medical: Not on file  . Transportation needs - non-medical: Not on file  Occupational History  . Occupation: On disability    Employer: UNEMPLOYED  Tobacco Use  . Smoking status: Former Smoker    Packs/day: 0.50    Types: Cigarettes    Last attempt to quit: 02/06/2016    Years since quitting: 1.0  . Smokeless tobacco: Never Used  . Tobacco comment: going to try e-sticks to quit  Substance and Sexual Activity  . Alcohol use: No    Alcohol/week: 0.0 oz  .  Drug use: No  . Sexual activity: Not Currently    Partners: Male  Other Topics Concern  . Not on file  Social History Narrative   8-10 cups of soda a day.  On disability. No regular exercise.     Past Surgical History:  Procedure Laterality Date  . ABDOMINAL HYSTERECTOMY  May 2004  . Basel Cell Carcinoma  06/2005, 07/2005   nasal tip and reconstruction  . CARPAL TUNNEL RELEASE  07/1993   both hands  . COLONOSCOPY    . HEMORROIDECTOMY  July 2003  . KNEE ARTHROPLASTY  09/2001, 05/2003   left knee, chondromalasia  . KNEE ARTHROSCOPY  jan 2005   Right knee, tisse release, chrondromalacia  .  REPLACEMENT TOTAL KNEE  Nov 2005   Left   . THYROID LOBECTOMY  01/2005   malignant area removed from right lobe  . THYROID SURGERY     thyroid lobe removal  . TONSILLECTOMY  09/1972  . TUBAL LIGATION  Sept 1999  . Ulnar nerve entrapment  Feb 1995   left elbow   Past Medical History:  Diagnosis Date  . Allergy   . Anxiety   . Cancer ([place])    cancerous nodules on thyroid  . Cancer ([place])    basal cell Cancer-nose  . Chronic pain syndrome   . COPD (chronic obstructive pulmonary disease) ([place])   . Degeneration of lumbar or lumbosacral intervertebral disc   . Depression   . Facet syndrome, lumbar   . GERD (gastroesophageal reflux disease)   . Hyperlipidemia   . Intervertebral lumbar disc disorder with myelopathy, lumbar region   . Lumbosacral spondylosis without myelopathy   . Migraine without aura, with intractable migraine, so stated, without mention of status migrainosus   . Primary localized osteoarthrosis, lower leg   . Thyroid disease    hypothyroid  . Unspecified musculoskeletal disorders and symptoms referable to neck    cervical/trapezius   There were no vitals taken for this visit.  Opioid Risk Score:  5 Fall Risk Score:  `1  Depression screen PHQ 2/9  Depression screen [facility] 2/9 10/25/2016 08/25/2016 07/14/2016 11/10/2015 09/06/2015 03/16/2015 09/17/2014  Decreased Interest 3 3 3 3 3 3 3   Down, Depressed, Hopeless 3 3 3 3  - 3 3  PHQ - 2 Score 6 6 6 6 3 6 6   Altered sleeping - - - - - - -  Tired, decreased energy - - - - - - -  Change in appetite - - - - - - -  Feeling bad or failure about yourself  - - - - - - -  Trouble concentrating - - - - - - -  Moving slowly or fidgety/restless - - - - - - -  Suicidal thoughts - - - - - - -  PHQ-9 Score - - - - - - -    Review of Systems  Constitutional: Positive for appetite change, diaphoresis and unexpected weight change.  HENT: Negative.   Eyes: Negative.   Cardiovascular: Positive for leg swelling.  Gastrointestinal:  Positive for nausea and vomiting.  Endocrine: Negative.   Genitourinary: Negative.   Musculoskeletal: Positive for arthralgias, back pain, gait problem, joint swelling and neck stiffness. Negative for neck pain.       Spasms  Skin: Negative.   Allergic/Immunologic: Negative.   Neurological: Positive for dizziness, tremors and numbness.       Tingling  Hematological: Negative.   Psychiatric/Behavioral: Positive for confusion and dysphoric mood. The patient is  nervous/anxious.        Panic attacks  All other systems reviewed and are negative.      Objective:   Physical Exam  Constitutional: She is oriented to person, place, and time. She appears well-developed and well-nourished.  HENT:  Head: Normocephalic and atraumatic.  Neck:  Cervical Paraspinal Tenderness: C-5-C-6  Cardiovascular: Normal rate and regular rhythm.  Pulmonary/Chest: Effort normal and breath sounds normal.  Musculoskeletal:  Normal Muscle Bulk and Muscle Testing Reveals: Upper Extremities: Full ROM and Muscle Strength 5/5 Thoracic Hypersensitivity Lumbar Paraspinal Tenderness: L-3-L-5 Bilateral Greater Trochanter Tenderness: L>R Lower Extremities: Full ROM and Muscle Strength 5 /5 Arises from Table Slowly using Cane for support Antalgic gait  Neurological: She is alert and oriented to person, place, and time.  Skin: Skin is warm and dry.  Psychiatric: She has a normal mood and affect.  Nursing note and vitals reviewed.         Assessment & Plan:  1.Chronic Bilateral Thoracic Back Pain/ Low Back Pain/ Lumbar Facet Arthropathy: 02/26/2017 UP:JSRPRXYVO  Oxycodone 10 mg one tablet 5 times a day as needed #150  and Refilled: MS Contin 100 mg one tablet every 12 hours #60. Second script given for the following month. Continue Gabapentin and Pamelor We will continue the opioid monitoring program, this consists of regular clinic visits, examinations, urine drug screen, pill counts as well as use of [place] Controlled Substance Reporting System. 2. Degenerative Disk Disease:Continue exercise, and heat therapy. Continue Current Medication Regime.02/26/2017 3. Bilateral Greater Trochanteric Bursitis: Continue with Heat and Ice Therapy. 02/26/2017 4. Osteoarthritis of Bilateral Knees: Continue exercise and heat therapy. 02/26/2017 5. Migraine Headaches: Continue Maxalt. 02/26/2017 6. Smoking Cessation: She has quit smoking since 08/25/2016. Continue to Monitor. 02/26/2017 7.Constipation: Continue Linzess. 02/26/2017 8. Muscle Spasm: Continue Tizanidine.02/26/2017 9. DepressionAnxiety/ Panic Attacks: Continue Celexaand  Counseling with Dr. Sima Matas. Psychologist Following. 02/26/2017 10. RSD: Continue with current medication Regime. 02/26/2017  30 minutes of face to face patient care time was spent during this visit. All questions were encouraged and answered.   F/U in 69months

## 2017-03-02 LAB — DRUG TOX MONITOR 1 W/CONF, ORAL FLD
Amphetamines: NEGATIVE ng/mL (ref <10)
Barbiturates: NEGATIVE ng/mL (ref <10)
Benzodiazepines: NEGATIVE ng/mL (ref <0.50)
Buprenorphine: NEGATIVE ng/mL (ref <0.10)
CODEINE: NEGATIVE ng/mL (ref <2.5)
Cocaine: NEGATIVE ng/mL (ref <5.0)
DIHYDROCODEINE: NEGATIVE ng/mL (ref <2.5)
Fentanyl: NEGATIVE ng/mL (ref <0.10)
HEROIN METABOLITE: NEGATIVE ng/mL (ref <1.0)
Hydrocodone: NEGATIVE ng/mL (ref <2.5)
Hydromorphone: NEGATIVE ng/mL (ref <2.5)
MARIJUANA: NEGATIVE ng/mL (ref <2.5)
MDMA: NEGATIVE ng/mL (ref <10)
MEPROBAMATE: NEGATIVE ng/mL (ref <2.5)
METHADONE: NEGATIVE ng/mL (ref <5.0)
Morphine: 25.2 ng/mL — ABNORMAL HIGH (ref <2.5)
NOROXYCODONE: NEGATIVE ng/mL (ref <2.5)
Nicotine Metabolite: NEGATIVE ng/mL (ref <5.0)
Norhydrocodone: NEGATIVE ng/mL (ref <2.5)
OPIATES: POSITIVE ng/mL — AB (ref <2.5)
OXYCODONE: NEGATIVE ng/mL (ref <2.5)
OXYMORPHONE: NEGATIVE ng/mL (ref <2.5)
PHENCYCLIDINE: NEGATIVE ng/mL (ref <10)
TAPENTADOL: NEGATIVE ng/mL (ref <5.0)
TRAMADOL: NEGATIVE ng/mL (ref <5.0)
Zolpidem: NEGATIVE ng/mL (ref <5.0)

## 2017-03-02 LAB — DRUG TOX ALC METAB W/CON, ORAL FLD: ALCOHOL METABOLITE: NEGATIVE ng/mL (ref <25)

## 2017-03-05 ENCOUNTER — Other Ambulatory Visit: Payer: Self-pay | Admitting: Registered Nurse

## 2017-03-06 ENCOUNTER — Telehealth: Payer: Self-pay | Admitting: *Deleted

## 2017-03-06 NOTE — Telephone Encounter (Signed)
Oral swab drug screen was consistent for prescribed medications. Morphine is present but oxycodone is absent. Reported last dose was 3 days prior to test, therefore this would be expected.

## 2017-03-14 ENCOUNTER — Other Ambulatory Visit: Payer: Self-pay | Admitting: Family Medicine

## 2017-03-16 ENCOUNTER — Telehealth: Payer: Self-pay | Admitting: Family Medicine

## 2017-03-16 ENCOUNTER — Encounter: Payer: Self-pay | Admitting: Psychology

## 2017-03-16 ENCOUNTER — Encounter: Payer: Medicare Other | Attending: Physical Medicine and Rehabilitation | Admitting: Psychology

## 2017-03-16 DIAGNOSIS — F329 Major depressive disorder, single episode, unspecified: Secondary | ICD-10-CM

## 2017-03-16 DIAGNOSIS — M47816 Spondylosis without myelopathy or radiculopathy, lumbar region: Secondary | ICD-10-CM | POA: Diagnosis not present

## 2017-03-16 DIAGNOSIS — G894 Chronic pain syndrome: Secondary | ICD-10-CM | POA: Diagnosis not present

## 2017-03-16 DIAGNOSIS — G43119 Migraine with aura, intractable, without status migrainosus: Secondary | ICD-10-CM | POA: Diagnosis not present

## 2017-03-16 DIAGNOSIS — Z760 Encounter for issue of repeat prescription: Secondary | ICD-10-CM | POA: Insufficient documentation

## 2017-03-16 DIAGNOSIS — Z1231 Encounter for screening mammogram for malignant neoplasm of breast: Secondary | ICD-10-CM

## 2017-03-16 NOTE — Progress Notes (Signed)
Patient:  [name]   DOB: 07/11/1960  MR Number: [id number]  Location: [facility] [address] [place] [id number] [place] [id number] Dept: [phone]  Start: 8 AM End: 9 AM  Provider/Observer:     Edgardo Roys PSYD  Chief Complaint:      Chief Complaint  Patient presents with  . Sleeping Problem  . Pain  . Headache  . Leg Pain  . Back Pain  . Depression    Reason For Service:     The patient is a 57 year old female that was referred for psychotherapeutic interventions because of issues related to panic attacks, anxiety, rash the patient has been dealing with chronic pain as well as cluster type migraine as well. The patient describes panic attacks as having a racing heart, becoming and feeling clammy and sweaty, experiencing tremors as well as. The patient experiences chronic pain in her shoulders, knees, hips, and low back. She reports that her migraine headaches are having a longer and longer duration going on for years.  The patient reports that she has been diagnosed with degenerative disc in her back as well as degenerative cartilage in her knees. She is also subsequently developed RSD. She reports that Dr. Maureen Ralphs performed a partial knee replacement in the left knee but feels he cannot do more about it. The patient reports that her sleep is very disturbed and broken up during the evening. She reports that she gets about 2 and half hours of sleep each night and will sleep some during the day or night. She feels like she is a "vampire" for being up at night all the time. The patient reports that she is able to do many daily activities such as cooking and that her husband gets by just by eating frozen meals.  Interventions Strategy:  Cognitive/behavioral psychotherapeutic interventions along with building coping skills and strategies and other adaptive  techniques around issues related to RSD, depression, and anxiety.  Participation Level:   Active  Participation Quality:  Appropriate      Behavioral Observation:  Well Groomed, Alert, and Tearful.   Current Psychosocial Factors: The patient reports that she has been able to spend some time with her granddaughter which is been very helpful for her mood and coping.  The patient reports that she continues to have severe pain and recurrent headaches.  However, she reports that the current attempts to reduce some of her dose levels of narcotic pain medications after she adjusted to them have not resulted in worsening or increasing pain levels.  Content of Session:   Reviewed current symptoms and work on therapeutic interventions are in issues related to her stressors around her marriage, medical issues, memory and cognitive changes etc.  Current Status:   The patient reports that headaches have continued but she is not experienced any greater intensity to her headaches.  The patient does report that she is having regular pain but is staying stable and she has been in fact able to do more activities with her granddaughter.  The patient reports that her mood has been improved.   Patient Progress:   Stable  Target Goals:   Target goals include working better adaptive strategies.  Last Reviewed:   03/16/2017  Impression/Diagnosis:   The patient has a long history of significant depression anxiety as well as panic attacks. She is ongoing issues with RSD and chronic pain to she has had numerous  orthopedic issues throughout her life. The patient has started taking Celexa which was prescribed by Dr. Tessa Lerner to help with pain and depression. She reports that it is helping some and she does not have as many crying spells  Diagnosis:   Chronic pain syndrome  Lumbar facet arthropathy  Intractable migraine with aura without status migrainosus  Reactive depression

## 2017-03-16 NOTE — Telephone Encounter (Signed)
Please call patient: Please tell her thank you for dropping off the paperwork.  I would like for her to come in for a follow-up appointment.  We have not seen her in well over a year and she is overdue to check her thyroid as well as her kidney function etc.  He is also due for cholesterol levels and we can also discuss possibly getting her screened for lung cancer.

## 2017-03-19 NOTE — Telephone Encounter (Signed)
[place] will have her to call and schedule an appointment when she gets home.[name] [place]

## 2017-03-23 NOTE — Telephone Encounter (Signed)
Please call and see if she goes somewhere for her mammograms.  I have not seen one in the system since 2013.  Also please remind her that it has been 10 years since her last tetanus shot and she is due.  And also please verify if she has had a flu vaccine this year.  If not then we can have declined.

## 2017-03-26 NOTE — Telephone Encounter (Signed)
Attempted to contact Pt - per Husband, she has a migraine and will return clinic call.

## 2017-03-28 NOTE — Telephone Encounter (Signed)
Spoke w/pt and she stated that she usually goes downstairs for her mammograms. Will place order for this. She stated that she does not do the flu shot will decline this on her chart and advised pt that she will need to call back and schedule a f/u appt for her thyroid check and Tdap. She was just waking up when I called. She voiced understanding and agreed.[name], [place]

## 2017-04-10 ENCOUNTER — Ambulatory Visit: Payer: Medicare Other | Admitting: Family Medicine

## 2017-04-17 ENCOUNTER — Encounter: Payer: Medicare Other | Attending: Physical Medicine and Rehabilitation | Admitting: Physical Medicine & Rehabilitation

## 2017-04-17 ENCOUNTER — Encounter: Payer: Self-pay | Admitting: Physical Medicine & Rehabilitation

## 2017-04-17 VITALS — BP 123/75 | HR 98 | Resp 14

## 2017-04-17 DIAGNOSIS — Z760 Encounter for issue of repeat prescription: Secondary | ICD-10-CM | POA: Insufficient documentation

## 2017-04-17 DIAGNOSIS — Z79891 Long term (current) use of opiate analgesic: Secondary | ICD-10-CM

## 2017-04-17 DIAGNOSIS — G894 Chronic pain syndrome: Secondary | ICD-10-CM | POA: Diagnosis not present

## 2017-04-17 DIAGNOSIS — Z5181 Encounter for therapeutic drug level monitoring: Secondary | ICD-10-CM | POA: Diagnosis not present

## 2017-04-17 MED ORDER — MORPHINE SULFATE ER 100 MG PO T12A: 100.0000 mg | EXTENDED_RELEASE_TABLET | Freq: Two times a day (BID) | ORAL | 0 refills | Status: DC

## 2017-04-17 MED ORDER — OXYCODONE HCL 10 MG PO TABS: 10.0000 mg | ORAL_TABLET | Freq: Every day | ORAL | 0 refills | Status: DC | PRN

## 2017-04-17 NOTE — Progress Notes (Signed)
Dr. Naaman Plummer declined UDS today

## 2017-04-17 NOTE — Addendum Note (Signed)
Addended by: Geryl Rankins D on: 04/17/2017 10:30 AM   Modules accepted: Orders

## 2017-04-17 NOTE — Patient Instructions (Signed)
PLEASE FEEL FREE TO CALL OUR OFFICE WITH ANY PROBLEMS OR QUESTIONS ([phone])      

## 2017-04-17 NOTE — Progress Notes (Signed)
Subjective:    Patient ID: [name], female    DOB: [birth year], 57 y.o.   MRN: [id number]  HPI  [name] is here in follow up of her chronic pain. Our NP just adjusted her oxycodone to 10mg  5x daily prn. She has more difficulties with breakthrough pain as a result.  She is trying modalities such as ice and heat to help with her breakthrough pain.  She also does her home exercise program and works on her bands.  She does remain limited with her ambulatory capacity due to her knee pain and now what is increasing right hip pain.  She continues to see Dr. Sima Matas for mood and pain coping skills.  She has found these visits helpful.  She remains somewhat depressed regarding her chronic pain as well as inability to be as active as she wants to be.  Pain Inventory Average Pain 8 Pain Right Now 7 My pain is constant, sharp, burning, dull, stabbing, tingling and aching  In the last 24 hours, has pain interfered with the following? General activity 10 Relation with others 10 Enjoyment of life 10 What TIME of day is your pain at its worst? all Sleep (in general) Poor  Pain is worse with: walking, bending, standing and some activites Pain improves with: rest, heat/ice, therapy/exercise, medication, TENS and injections Relief from Meds: 6  Mobility walk with assistance use a cane use a walker how many minutes can you walk? 5-6 ability to climb steps?  yes do you drive?  yes Do you have any goals in this area?  yes  Function not employed: date last employed . disabled: date disabled . I need assistance with the following:  bathing, meal prep, household duties and shopping  Neuro/Psych weakness numbness tremor tingling trouble walking spasms dizziness confusion depression anxiety  Prior Studies Any changes since last visit?  no  Physicians involved in your care Any changes since last visit?  no   Family History  Problem Relation Age of Onset  .  Depression Mother   . Hypertension Mother   . Hypertension Father   . Heart failure Father   . Diabetes Father   . Heart attack Father   . Colon cancer Neg Hx   . Esophageal cancer Neg Hx   . Stomach cancer Neg Hx   . Rectal cancer Neg Hx    Social History   Socioeconomic History  . Marital status: Married    Spouse name: [name]  . Number of children: 1  . Years of education: some colle  . Highest education level: None  Social Needs  . Financial resource strain: None  . Food insecurity - worry: None  . Food insecurity - inability: None  . Transportation needs - medical: None  . Transportation needs - non-medical: None  Occupational History  . Occupation: On disability    Employer: UNEMPLOYED  Tobacco Use  . Smoking status: Former Smoker    Packs/day: 0.50    Types: Cigarettes    Last attempt to quit: 02/06/2016    Years since quitting: 1.1  . Smokeless tobacco: Never Used  . Tobacco comment: going to try e-sticks to quit  Substance and Sexual Activity  . Alcohol use: No    Alcohol/week: 0.0 oz  . Drug use: No  . Sexual activity: Not Currently    Partners: Male  Other Topics Concern  . None  Social History Narrative   8-10 cups of soda a day.  On disability. No regular  exercise.     Past Surgical History:  Procedure Laterality Date  . ABDOMINAL HYSTERECTOMY  May 2004  . Basel Cell Carcinoma  06/2005, 07/2005   nasal tip and reconstruction  . CARPAL TUNNEL RELEASE  07/1993   both hands  . COLONOSCOPY    . HEMORROIDECTOMY  July 2003  . KNEE ARTHROPLASTY  09/2001, 05/2003   left knee, chondromalasia  . KNEE ARTHROSCOPY  jan 2005   Right knee, tisse release, chrondromalacia  . REPLACEMENT TOTAL KNEE  Nov 2005   Left   . THYROID LOBECTOMY  01/2005   malignant area removed from right lobe  . THYROID SURGERY     thyroid lobe removal  . TONSILLECTOMY  09/1972  . TUBAL LIGATION  Sept 1999  . Ulnar nerve entrapment  Feb 1995   left elbow   Past Medical History:    Diagnosis Date  . Allergy   . Anxiety   . Cancer ([place])    cancerous nodules on thyroid  . Cancer ([place])    basal cell Cancer-nose  . Chronic pain syndrome   . COPD (chronic obstructive pulmonary disease) ([place])   . Degeneration of lumbar or lumbosacral intervertebral disc   . Depression   . Facet syndrome, lumbar   . GERD (gastroesophageal reflux disease)   . Hyperlipidemia   . Intervertebral lumbar disc disorder with myelopathy, lumbar region   . Lumbosacral spondylosis without myelopathy   . Migraine without aura, with intractable migraine, so stated, without mention of status migrainosus   . Primary localized osteoarthrosis, lower leg   . Thyroid disease    hypothyroid  . Unspecified musculoskeletal disorders and symptoms referable to neck    cervical/trapezius   BP 123/75 (BP Location: Left Arm, Patient Position: Sitting, Cuff Size: Large)   Pulse 98   Resp 14   SpO2 91%   Opioid Risk Score:   Fall Risk Score:  `1  Depression screen PHQ 2/9  Depression screen [facility] 2/9 10/25/2016 08/25/2016 07/14/2016 11/10/2015 09/06/2015 03/16/2015 09/17/2014  Decreased Interest 3 3 3 3 3 3 3   Down, Depressed, Hopeless 3 3 3 3  - 3 3  PHQ - 2 Score 6 6 6 6 3 6 6   Altered sleeping - - - - - - -  Tired, decreased energy - - - - - - -  Change in appetite - - - - - - -  Feeling bad or failure about yourself  - - - - - - -  Trouble concentrating - - - - - - -  Moving slowly or fidgety/restless - - - - - - -  Suicidal thoughts - - - - - - -  PHQ-9 Score - - - - - - -    Review of Systems  Constitutional: Positive for appetite change and diaphoresis. Negative for unexpected weight change.  Cardiovascular: Positive for leg swelling.  Gastrointestinal: Positive for nausea.  Musculoskeletal: Positive for arthralgias, back pain, gait problem, myalgias, neck pain and neck stiffness.  Neurological: Positive for dizziness, tremors, weakness and numbness.       Tingling  Psychiatric/Behavioral:  Positive for confusion and dysphoric mood. The patient is nervous/anxious.   All other systems reviewed and are negative.      Objective:   Physical Exam  General: No acute distress HEENT: EOMI, oral membranes moist Cards: reg rate  Chest: normal effort Abdomen: Soft, NT, ND Skin: dry, intact Extremities: no edema  Musculoskeletal: She exhibits edema bilateral knees.  She has pain with  resisted extension and flexion bilateral knees and walks with antalgia more on the right than left today.  Uses a cane for offloading. Neurological: Normal motor and sensory exam except for pain inhibition.  Cognitively she is appropriate. Psychiatry: Pleasant as always although became tearful at times and at times appears depressed.          1.Chronic Low Back Pain: Refilled: Oxycodone 10 mg one tablet 5 times a day as needed #150.  We will change her from MS Contin controlled release to morphabond ER 100 mg every 12 hours.  This may be helpful for her breakthrough pain as we continue to reduce her medications.  Continue Gabapentin and Pamelor -We will continue the opioid monitoring program, this consists of regular clinic visits, examinations, routine drug screening, pill counts as well as use of [place] Controlled Substance Reporting System. [place] was [place] today.    -My next goal for her will be to decrease her oxycodone to 10 mg every 6 hours as needed   -Patient was provided literature on opioid hyperalgesia.  She appears open to the concept and to trying to decrease her medications. 2. Degenerative Disk Disease:Continue exercise, and heat therapy.  3. Osteoarthritis of Bilateral Knees: Continue exercise and HEP             -Continue compression stockings and edema control 4. Migraine Headaches: On Maxalt.  5. Smoking Cessation: 3 months plus cessation! 6. Constipation: Continue Linzess 7. Muscle Spasm: Continue Tizanidine 8. Chronic depression: celexa. Continue  counseling per [place].  This appears to be assisting her with coping skills.             -Still needs to improve sleep habits.  15 minutes of face to face patient care time was spent during this visit. All questions were encouraged and answered.   Greater than 50% of the time during this visit was spent counseling patient regarding initiative to decrease her medications and reviewing opioid hyperalgesia.  Either my nurse practitioner or I will see her back in 1 month's time..            Assessment & Plan:

## 2017-04-25 ENCOUNTER — Telehealth: Payer: Self-pay | Admitting: Physical Medicine & Rehabilitation

## 2017-04-25 MED ORDER — MORPHINE-NALTREXONE 100-4 MG PO CPCR: 100.0000 mg | ORAL_CAPSULE | Freq: Two times a day (BID) | ORAL | 0 refills | Status: DC

## 2017-04-25 NOTE — Telephone Encounter (Signed)
Prior authorization submitted ans subsequently denied. Formulary alternatives must be tried first.  Our choices are  Embeda, Hysingla ER, and Xtampza ER. As stated previously in this phone thread, patient is almost out of medication.  Please advise

## 2017-04-25 NOTE — Telephone Encounter (Signed)
embeda 100mg  q12 #60 sent to [facility]

## 2017-04-25 NOTE — Telephone Encounter (Signed)
Patient can't get her new medication that has replaced her [employer] isn't approving it and needs a call for prior authorization.  Please call patient when this s complete.  She takes her last pill today at noon.

## 2017-04-26 NOTE — Telephone Encounter (Signed)
Notified patient, she can't pick up till tomorrow, med needs to be ordered

## 2017-05-03 ENCOUNTER — Telehealth: Payer: Self-pay | Admitting: *Deleted

## 2017-05-03 MED ORDER — MORPHINE-NALTREXONE 100-4 MG PO CPCR: 100.0000 mg | ORAL_CAPSULE | Freq: Two times a day (BID) | ORAL | 0 refills | Status: DC

## 2017-05-03 NOTE — Telephone Encounter (Signed)
30 day rx sent to pharmacy.

## 2017-05-03 NOTE — Telephone Encounter (Signed)
Sending a reminder to submit a new script electronically to patients pharmacy for Embeda  so they can attempt to order and fill the medication. Patient was only able to receive a 15 day supply initially

## 2017-05-03 NOTE — Telephone Encounter (Signed)
Contacted the pharmacy and asked them to call us if there is any problem with restocking this medication.

## 2017-05-11 ENCOUNTER — Encounter: Payer: Self-pay | Admitting: Psychology

## 2017-05-11 ENCOUNTER — Encounter: Payer: Medicare Other | Attending: Physical Medicine and Rehabilitation | Admitting: Psychology

## 2017-05-11 DIAGNOSIS — G894 Chronic pain syndrome: Secondary | ICD-10-CM | POA: Diagnosis not present

## 2017-05-11 DIAGNOSIS — M47816 Spondylosis without myelopathy or radiculopathy, lumbar region: Secondary | ICD-10-CM | POA: Diagnosis not present

## 2017-05-11 DIAGNOSIS — Z760 Encounter for issue of repeat prescription: Secondary | ICD-10-CM | POA: Insufficient documentation

## 2017-05-11 DIAGNOSIS — F329 Major depressive disorder, single episode, unspecified: Secondary | ICD-10-CM

## 2017-05-11 NOTE — Progress Notes (Signed)
Patient:  [name]   DOB: 03-Dec-1960  MR Number: [id number]  Location: [facility] [address] [place] [id number] [place] [address] Dept: [phone]  Start: 9 AM End: 10 AM  Provider/Observer:     Edgardo Roys PSYD  Chief Complaint:      Chief Complaint  Patient presents with  . Leg Pain  . Back Pain  . Depression  . Stress    Reason For Service:     The patient is a 57 year old female that was referred for psychotherapeutic interventions because of issues related to panic attacks, anxiety, rash the patient has been dealing with chronic pain as well as cluster type migraine as well. The patient describes panic attacks as having a racing heart, becoming and feeling clammy and sweaty, experiencing tremors as well as. The patient experiences chronic pain in her shoulders, knees, hips, and low back. She reports that her migraine headaches are having a longer and longer duration going on for years.  The patient reports that she has been diagnosed with degenerative disc in her back as well as degenerative cartilage in her knees. She is also subsequently developed RSD. She reports that Dr. Maureen Ralphs performed a partial knee replacement in the left knee but feels he cannot do more about it. The patient reports that her sleep is very disturbed and broken up during the evening. She reports that she gets about 2 and half hours of sleep each night and will sleep some during the day or night. She feels like she is a "vampire" for being up at night all the time. The patient reports that she is able to do many daily activities such as cooking and that her husband gets by just by eating frozen meals.  Interventions Strategy:  Cognitive/behavioral psychotherapeutic interventions along with building coping skills and strategies and other adaptive techniques around issues related to  RSD, depression, and anxiety.  Participation Level:   Active  Participation Quality:  Appropriate      Behavioral Observation:  Well Groomed, Alert, and Tearful.   Current Psychosocial Factors: The patient reports that she has been working on having more things to do during the day that are reoccurring events rather than the sporadic time she is able to travel to see her granddaughter and daughter.  The patient reports that she is in the process of trying to get her membership at [employer] so she can begin some water aerobics classes.  The patient reports that the recent events where a new medicine was not available through the pharmacy created some stress for her but our office was able to work that out for her and get her the medicine.  Content of Session:   Reviewed current symptoms and work on therapeutic interventions are in issues related to her stressors around her marriage, medical issues, memory and cognitive changes etc.  Current Status:   The patient reports that headaches have continued but she is not experienced any greater intensity to her headaches.  The patient does report that she is having regular pain but is staying stable and she has been in fact able to do more activities with her granddaughter.  The patient reports that her mood has been improved.   Patient Progress:   Stable  Target Goals:   Target goals include working better adaptive strategies.  Last Reviewed:   05/11/2017  Impression/Diagnosis:   The patient has a long  history of significant depression anxiety as well as panic attacks. She is ongoing issues with RSD and chronic pain to she has had numerous orthopedic issues throughout her life. The patient has started taking Celexa which was prescribed by Dr. Tessa Lerner to help with pain and depression. She reports that it is helping some and she does not have as many crying spells  Diagnosis:   Chronic pain syndrome  Lumbar facet arthropathy  Reactive depression

## 2017-05-13 ENCOUNTER — Other Ambulatory Visit: Payer: Self-pay | Admitting: Registered Nurse

## 2017-05-14 NOTE — Telephone Encounter (Signed)
Refill request send to [facility] for Gabapentin 800mg  one four times a day #120 with 2 refills and Linzness 124mcg #30 with 2 refills. Pt next appt 05/18/17.

## 2017-05-18 ENCOUNTER — Encounter: Payer: Self-pay | Admitting: Registered Nurse

## 2017-05-18 ENCOUNTER — Encounter (HOSPITAL_BASED_OUTPATIENT_CLINIC_OR_DEPARTMENT_OTHER): Payer: Medicare Other | Admitting: Registered Nurse

## 2017-05-18 VITALS — BP 127/84 | HR 96 | Resp 14 | Ht 68.0 in | Wt 272.0 lb

## 2017-05-18 DIAGNOSIS — M1712 Unilateral primary osteoarthritis, left knee: Secondary | ICD-10-CM | POA: Diagnosis not present

## 2017-05-18 DIAGNOSIS — M51369 Other intervertebral disc degeneration, lumbar region without mention of lumbar back pain or lower extremity pain: Secondary | ICD-10-CM

## 2017-05-18 DIAGNOSIS — G9050 Complex regional pain syndrome I, unspecified: Secondary | ICD-10-CM

## 2017-05-18 DIAGNOSIS — G43119 Migraine with aura, intractable, without status migrainosus: Secondary | ICD-10-CM

## 2017-05-18 DIAGNOSIS — F329 Major depressive disorder, single episode, unspecified: Secondary | ICD-10-CM | POA: Diagnosis not present

## 2017-05-18 DIAGNOSIS — F411 Generalized anxiety disorder: Secondary | ICD-10-CM | POA: Diagnosis not present

## 2017-05-18 DIAGNOSIS — M7061 Trochanteric bursitis, right hip: Secondary | ICD-10-CM

## 2017-05-18 DIAGNOSIS — M5136 Other intervertebral disc degeneration, lumbar region: Secondary | ICD-10-CM | POA: Diagnosis not present

## 2017-05-18 DIAGNOSIS — M47816 Spondylosis without myelopathy or radiculopathy, lumbar region: Secondary | ICD-10-CM

## 2017-05-18 DIAGNOSIS — M7918 Myalgia, other site: Secondary | ICD-10-CM | POA: Diagnosis not present

## 2017-05-18 DIAGNOSIS — M546 Pain in thoracic spine: Secondary | ICD-10-CM | POA: Diagnosis not present

## 2017-05-18 DIAGNOSIS — M1711 Unilateral primary osteoarthritis, right knee: Secondary | ICD-10-CM | POA: Diagnosis not present

## 2017-05-18 DIAGNOSIS — G894 Chronic pain syndrome: Secondary | ICD-10-CM | POA: Diagnosis not present

## 2017-05-18 DIAGNOSIS — Z79891 Long term (current) use of opiate analgesic: Secondary | ICD-10-CM

## 2017-05-18 DIAGNOSIS — M7062 Trochanteric bursitis, left hip: Secondary | ICD-10-CM

## 2017-05-18 DIAGNOSIS — Z5181 Encounter for therapeutic drug level monitoring: Secondary | ICD-10-CM | POA: Diagnosis not present

## 2017-05-18 DIAGNOSIS — G8929 Other chronic pain: Secondary | ICD-10-CM

## 2017-05-18 DIAGNOSIS — Z760 Encounter for issue of repeat prescription: Secondary | ICD-10-CM | POA: Diagnosis not present

## 2017-05-18 MED ORDER — OXYCODONE HCL 10 MG PO TABS: 10.0000 mg | ORAL_TABLET | Freq: Every day | ORAL | 0 refills | Status: DC | PRN

## 2017-05-18 NOTE — Progress Notes (Signed)
Subjective:    Patient ID: [name], female    DOB: 01/13/1961, 57 y.o.   MRN: [id number]  HPI: [name] is a 57 year old female who returns for follow up appointment for chronic pain and medication refill. She states her pain is located in her lower back, bilateral hips and bilateral knees R>L. [name] reports her pain has increased in intensity since prescribed Embeda, she's not receiving any relief of her pain, this has impacted her activity, she is not mobile as before. Her insurance plan will be reviewed,  We discussed Xtampza and this provider will speak with Dr. Naaman Plummer on Monday 05/21/2017, he's off today, she verbalizes understanding.   [name] also reports she's been having difficulty with swallowing sporadically for over a year. She was instructed to call her PCP and obtain a referral to Gastroenterology, she verbalizes understanding.   [name] Morphine equivalent is 280.00 MME.   Last Oral Swab was Performed on 02/26/2017, it was consistent.    Pain Inventory Average Pain 9 Pain Right Now 8 My pain is constant, sharp, burning, dull, stabbing, tingling and aching  In the last 24 hours, has pain interfered with the following? General activity 10 Relation with others 10 Enjoyment of life 10 What TIME of day is your pain at its worst? all Sleep (in general) Poor  Pain is worse with: walking, bending, standing and some activites Pain improves with: rest, heat/ice, therapy/exercise, medication, TENS and injections Relief from Meds: 6  Mobility walk with assistance use a cane use a walker how many minutes can you walk? 5-7 ability to climb steps?  yes do you drive?  yes Do you have any goals in this area?  yes  Function not employed: date last employed . disabled: date disabled . I need assistance with the following:  bathing, meal prep, household duties and shopping Do you have any goals in this area?   yes  Neuro/Psych weakness numbness tremor tingling trouble walking spasms confusion depression anxiety  Prior Studies Any changes since last visit?  no  Physicians involved in your care Any changes since last visit?  no   Family History  Problem Relation Age of Onset  . Depression Mother   . Hypertension Mother   . Hypertension Father   . Heart failure Father   . Diabetes Father   . Heart attack Father   . Colon cancer Neg Hx   . Esophageal cancer Neg Hx   . Stomach cancer Neg Hx   . Rectal cancer Neg Hx    Social History   Socioeconomic History  . Marital status: Married    Spouse name: [name]  . Number of children: 1  . Years of education: some colle  . Highest education level: Not on file  Occupational History  . Occupation: On disability    Employer: UNEMPLOYED  Social Needs  . Financial resource strain: Not on file  . Food insecurity:    Worry: Not on file    Inability: Not on file  . Transportation needs:    Medical: Not on file    Non-medical: Not on file  Tobacco Use  . Smoking status: Former Smoker    Packs/day: 0.50    Types: Cigarettes    Last attempt to quit: 02/06/2016    Years since quitting: 1.2  . Smokeless tobacco: Never Used  . Tobacco comment: going to try e-sticks to quit  Substance and Sexual Activity  . Alcohol use: No  Alcohol/week: 0.0 oz  . Drug use: No  . Sexual activity: Not Currently    Partners: Male  Lifestyle  . Physical activity:    Days per week: Not on file    Minutes per session: Not on file  . Stress: Not on file  Relationships  . Social connections:    Talks on phone: Not on file    Gets together: Not on file    Attends religious service: Not on file    Active member of club or organization: Not on file    Attends meetings of clubs or organizations: Not on file    Relationship status: Not on file  Other Topics Concern  . Not on file  Social History Narrative   8-10 cups of soda a day.  On  disability. No regular exercise.     Past Surgical History:  Procedure Laterality Date  . ABDOMINAL HYSTERECTOMY  May 2004  . Basel Cell Carcinoma  06/2005, 07/2005   nasal tip and reconstruction  . CARPAL TUNNEL RELEASE  07/1993   both hands  . COLONOSCOPY    . HEMORROIDECTOMY  July 2003  . KNEE ARTHROPLASTY  09/2001, 05/2003   left knee, chondromalasia  . KNEE ARTHROSCOPY  jan 2005   Right knee, tisse release, chrondromalacia  . REPLACEMENT TOTAL KNEE  Nov 2005   Left   . THYROID LOBECTOMY  01/2005   malignant area removed from right lobe  . THYROID SURGERY     thyroid lobe removal  . TONSILLECTOMY  09/1972  . TUBAL LIGATION  Sept 1999  . Ulnar nerve entrapment  Feb 1995   left elbow   Past Medical History:  Diagnosis Date  . Allergy   . Anxiety   . Cancer ([place])    cancerous nodules on thyroid  . Cancer ([place])    basal cell Cancer-nose  . Chronic pain syndrome   . COPD (chronic obstructive pulmonary disease) ([place])   . Degeneration of lumbar or lumbosacral intervertebral disc   . Depression   . Facet syndrome, lumbar   . GERD (gastroesophageal reflux disease)   . Hyperlipidemia   . Intervertebral lumbar disc disorder with myelopathy, lumbar region   . Lumbosacral spondylosis without myelopathy   . Migraine without aura, with intractable migraine, so stated, without mention of status migrainosus   . Primary localized osteoarthrosis, lower leg   . Thyroid disease    hypothyroid  . Unspecified musculoskeletal disorders and symptoms referable to neck    cervical/trapezius   BP 127/84 (BP Location: Left Arm, Patient Position: Sitting, Cuff Size: Normal)   Pulse 96   Resp 14   Ht 5\' 8"  (1.727 m)   Wt 272 lb (123.4 kg)   SpO2 93%   BMI 41.36 kg/m   Opioid Risk Score:   Fall Risk Score:  `1  Depression screen PHQ 2/9  Depression screen [facility] 2/9 10/25/2016 08/25/2016 07/14/2016 11/10/2015 09/06/2015 03/16/2015 09/17/2014  Decreased Interest 3 3 3 3 3 3 3   Down, Depressed,  Hopeless 3 3 3 3  - 3 3  PHQ - 2 Score 6 6 6 6 3 6 6   Altered sleeping - - - - - - -  Tired, decreased energy - - - - - - -  Change in appetite - - - - - - -  Feeling bad or failure about yourself  - - - - - - -  Trouble concentrating - - - - - - -  Moving slowly or fidgety/restless - - - - - - -  Suicidal thoughts - - - - - - -  PHQ-9 Score - - - - - - -    Review of Systems  Constitutional: Positive for appetite change, diaphoresis and unexpected weight change.  HENT: Negative.   Eyes: Negative.   Respiratory: Negative.   Cardiovascular: Positive for leg swelling.  Gastrointestinal: Positive for nausea and vomiting.  Endocrine: Negative.   Genitourinary: Negative.   Musculoskeletal: Positive for arthralgias, back pain, gait problem and neck pain.       Spasms  Skin: Negative.   Allergic/Immunologic: Negative.   Neurological: Positive for tremors, weakness and numbness.       Tingling   Psychiatric/Behavioral: Positive for confusion and dysphoric mood. The patient is nervous/anxious.        Objective:   Physical Exam  Constitutional: She is oriented to person, place, and time. She appears well-developed and well-nourished.  HENT:  Head: Normocephalic and atraumatic.  Neck: Normal range of motion. Neck supple.  Cardiovascular: Normal rate and regular rhythm.  Pulmonary/Chest: Effort normal and breath sounds normal.  Musculoskeletal:  Normal Muscle Bulk and Muscle Testing Reveals: Upper Extremities: Full ROM and Muscle Strength 5/5 Thoracic and Lumbar Hypersensitivity Bilateral Greater Trochanter Tenderness Lower Extremities: Decreased ROM and Muscle Strength 4/5 Bilateral Lower Extremities Flexion Produces Pain into Bilateral Patella's Arises from Table slowly using 3 prong cane for support Antalgic Gait   Neurological: She is alert and oriented to person, place, and time.  Skin: Skin is warm and dry.  Psychiatric: She has a normal mood and affect.  Nursing note and  vitals reviewed.         Assessment & Plan:  1.Chronic Bilateral Thoracic Back Pain/ Low Back Pain/ Lumbar Facet Arthropathy: Will discussed with Dr. Naaman Plummer regarding prescribing Xtampza, Embeda ineffective. No Embeda ordered today.  05/18/2017 Continue current treatment with Oxycodone Refilled Oxycodone 10 mg one tablet 5 times a day as needed #150.Continue Gabapentin and Pamelor We will continue the opioid monitoring program, this consists of regular clinic visits, examinations, urine drug screen, pill counts as well as use of [place] Controlled Substance Reporting System. 2. Degenerative Disk Disease:Encouraged Continue HEP as Tolertated  and heat therapy. Continue Current Medication Regime.05/18/2017 3. Bilateral Greater Trochanteric Bursitis: Continue current treatment regimen with Heat and Ice Therapy. 05/18/2017 4. Osteoarthritis of Bilateral Knees: Continue current treatment modality with home exercise program and heat therapy. 05/18/2017 5. Migraine Headaches: Continue current medication regime with Maxalt. 05/18/2017 6. Smoking Cessation: She has quit smoking since 08/25/2016. Continue to Monitor. 05/18/2017 7.Constipation: Continue  Current medication regime with Linzess. 05/18/2017 8. Muscle Spasm: Continue current medication regime with Tizanidine.05/18/2017 9. DepressionAnxiety/ Panic Attacks: Continue current medication regimen and treatment modality.  Celexaand Counseling with  Dr. Sima Matas.Psychologist Following. 05/18/2017 10. RSD: Continue with current medication Regime. 05/18/2017  30 minutes of face to face patient care time was spent during this visit. All questions were encouraged and answered.   F/U in 7months

## 2017-05-18 NOTE — Patient Instructions (Signed)
Oxycodone extended-release capsules What is this medicine? OXYCODONE (ox i KOE done) is a pain reliever. It is used to treat constant pain that lasts for more than a few days. This medicine may be used for other purposes; ask your health care provider or pharmacist if you have questions. COMMON BRAND NAME(S): XTAMPZA What should I tell my health care provider before I take this medicine? They need to know if you have any of these conditions: -Addison's disease -brain tumor -gallbladder disease -head injury -heart disease -history of a drug or alcohol abuse problem -if you often drink alcohol -kidney disease -liver disease -lung or breathing disease, like asthma -mental illness -pancreatic disease -seizures -stomach or intestine problems -thyroid disease -an unusual or allergic reaction to oxycodone, codeine, hydrocodone, morphine, other medicines, foods, dyes, or preservatives -pregnant or trying to get pregnant -breast-feeding How should I use this medicine? Take this medicine by mouth with a full glass of water. Follow the directions on the prescription label. Take this medicine with food. You should always take it with the same amount of food each time. Take your medicine at regular intervals. Do not take it more often than directed. Do not stop taking except on your doctor's advice. A special MedGuide will be given to you by the pharmacist with each prescription and refill. Be sure to read this information carefully each time. Talk to your pediatrician regarding the use of this medicine in children. Special care may be needed. Overdosage: If you think you have taken too much of this medicine contact a poison control center or emergency room at once. NOTE: This medicine is only for you. Do not share this medicine with others. What if I miss a dose? If you miss a dose, take it as soon as you can. If it is almost time for your next dose, take only that dose. Do not take double or  extra doses. What may interact with this medicine? This medicine may interact with the following medications: -alcohol -antihistamines for allergy, cough and cold -antiviral medicines for HIV or AIDS -atropine -certain antibiotics like clarithromycin, erythromycin, linezolid, rifampin -certain medicines for anxiety or sleep -certain medicines for bladder problems like oxybutynin, tolterodine -certain medicines for depression like amitriptyline, fluoxetine, sertraline -certain medicines for fungal infections like ketoconazole, itraconazole, voriconazole -certain medicines for migraine headache like almotriptan, eletriptan, frovatriptan, naratriptan, rizatriptan, sumatriptan, zolmitriptan -certain medicines for nausea or vomiting like dolasetron, ondansetron, palonosetron -certain medicines for Parkinson's disease like benztropine, trihexyphenidyl -certain medicines for seizures like phenobarbital, phenytoin, primidone -certain medicines for stomach problems like dicyclomine, hyoscyamine -certain medicines for travel sickness like scopolamine -diuretics -general anesthetics like halothane, isoflurane, methoxyflurane, propofol -ipratropium -local anesthetics like lidocaine, pramoxine, tetracaine -MAOIs like Carbex, Eldepryl, Marplan, Nardil, and Parnate -medicines that relax muscles for surgery -methylene blue -nilotinib -other narcotic medicines for pain or cough -phenothiazines like chlorpromazine, mesoridazine, prochlorperazine, thioridazine This list may not describe all possible interactions. Give your health care provider a list of all the medicines, herbs, non-prescription drugs, or dietary supplements you use. Also tell them if you smoke, drink alcohol, or use illegal drugs. Some items may interact with your medicine. What should I watch for while using this medicine? Tell your doctor or health care professional if your pain does not go away, if it gets worse, or if you have new or  a different type of pain. You may develop tolerance to the medicine. Tolerance means that you will need a higher dose of the medication for pain relief. Tolerance  is normal and is expected if you take this medicine for a long time. Do not suddenly stop taking your medicine because you may develop a severe reaction. Your body becomes used to the medicine. This does NOT mean you are addicted. Addiction is a behavior related to getting and using a drug for a non-medical reason. If you have pain, you have a medical reason to take pain medicine. Your doctor will tell you how much medicine to take. If your doctor wants you to stop the medicine, the dose will be slowly lowered over time to avoid any side effects. There are different types of narcotic medicines (opiates). If you take more than one type at the same time or if you are taking another medicine that also causes drowsiness, you may have more side effects. Give your health care provider a list of all medicines you use. Your doctor will tell you how much medicine to take. Do not take more medicine than directed. Call emergency for help if you have problems breathing or unusual sleepiness. You may get drowsy or dizzy. Do not drive, use machinery, or do anything that needs mental alertness until you know how the medicine affects you. Do not stand or sit up quickly, especially if you are an older patient. This reduces the risk of dizzy or fainting spells. Alcohol may interfere with the effect of this medicine. Avoid alcoholic drinks. This medicine will cause constipation. Try to have a bowel movement at least every 2 to 3 days. If you do not have a bowel movement for 3 days, call your doctor or health care professional. Your mouth may get dry. Chewing sugarless gum or sucking on hard candy, and drinking plenty of water may help. Contact your doctor if the problem does not go away or is severe. What side effects may I notice from receiving this medicine? Side  effects that you should report to your doctor or health care professional as soon as possible: -allergic reactions like skin rash, itching or hives, swelling of the face, lips, or tongue -breathing problems -confusion -signs and symptoms of low blood pressure like dizziness; feeling faint or lightheaded, falls; unusually weak or tired -trouble passing urine or change in the amount of urine -trouble swallowing Side effects that usually do not require medical attention (report to your doctor or health care professional if they continue or are bothersome): -constipation -dry mouth -nausea, vomiting -tiredness This list may not describe all possible side effects. Call your doctor for medical advice about side effects. You may report side effects to FDA at 1-800-FDA-1088. Where should I keep my medicine? Keep out of the reach of children. This medicine can be abused. Keep your medicine in a safe place to protect it from theft. Do not share this medicine with anyone. Selling or giving away this medicine is dangerous and against the law. Follow the directions in the [place]. Store at room temperature between 15 and 30 degrees C (59 and 86 degrees F). Protect from light. Keep container tightly closed. This medicine may cause accidental overdose and death if it is taken by other adults, children, or pets. Flush any unused medicine down the toilet to reduce the chance of harm. Do not use the medicine after the expiration date. NOTE: This sheet is a summary. It may not cover all possible information. If you have questions about this medicine, talk to your doctor, pharmacist, or health care provider.  2018 Elsevier/Gold Standard (2015-02-24 18:07:49)

## 2017-05-24 ENCOUNTER — Encounter: Payer: Self-pay | Admitting: Psychology

## 2017-05-24 ENCOUNTER — Encounter (HOSPITAL_BASED_OUTPATIENT_CLINIC_OR_DEPARTMENT_OTHER): Payer: Medicare Other | Admitting: Psychology

## 2017-05-24 DIAGNOSIS — G894 Chronic pain syndrome: Secondary | ICD-10-CM | POA: Diagnosis not present

## 2017-05-24 DIAGNOSIS — M5136 Other intervertebral disc degeneration, lumbar region: Secondary | ICD-10-CM

## 2017-05-24 DIAGNOSIS — M546 Pain in thoracic spine: Secondary | ICD-10-CM

## 2017-05-24 DIAGNOSIS — M47816 Spondylosis without myelopathy or radiculopathy, lumbar region: Secondary | ICD-10-CM

## 2017-05-24 DIAGNOSIS — F411 Generalized anxiety disorder: Secondary | ICD-10-CM | POA: Diagnosis not present

## 2017-05-24 DIAGNOSIS — G8929 Other chronic pain: Secondary | ICD-10-CM | POA: Diagnosis not present

## 2017-05-24 DIAGNOSIS — F329 Major depressive disorder, single episode, unspecified: Secondary | ICD-10-CM | POA: Diagnosis not present

## 2017-05-24 DIAGNOSIS — Z760 Encounter for issue of repeat prescription: Secondary | ICD-10-CM | POA: Diagnosis not present

## 2017-05-24 DIAGNOSIS — M51369 Other intervertebral disc degeneration, lumbar region without mention of lumbar back pain or lower extremity pain: Secondary | ICD-10-CM

## 2017-05-24 NOTE — Progress Notes (Signed)
Patient:  [name]   DOB: 04-24-1960  MR Number: [id number]  Location: [facility] [address], [place] [id number] [place]  [address] Dept: [phone]  Start: 9 AM End: 10 AM  Provider/Observer:     Edgardo Roys PSYD  Chief Complaint:      Chief Complaint  Patient presents with  . Pain  . Headache  . Anxiety  . Stress    Reason For Service:     The patient is a 57 year old female that was referred for psychotherapeutic interventions because of issues related to panic attacks, anxiety, and stress.  The patient has been dealing with chronic pain as well as cluster type migraine as well. The patient describes panic attacks as having a racing heart, becoming and feeling clammy and sweaty, experiencing tremors as well as. The patient experiences chronic pain in her shoulders, knees, hips, and low back. She reports that her migraine headaches are having a longer and longer duration going on for years.  The patient reports that she has been diagnosed with degenerative disc in her back as well as degenerative cartilage in her knees. She is also subsequently developed RSD. She reports that Dr. Maureen Ralphs performed a partial knee replacement in the left knee but feels he cannot do more about it. The patient reports that her sleep is very disturbed and broken up during the evening. She reports that she gets about 2 and half hours of sleep each night and will sleep some during the day or night. She feels like she is a "vampire" for being up at night all the time. The patient reports that she is able to do many daily activities such as cooking and that her husband gets by just by eating frozen meals.  The above history has been reviewed and remains consistent with reason for service.  The patient has continued with severe pain and RSD.  Interventions  Strategy:  Cognitive/behavioral psychotherapeutic interventions along with building coping skills and strategies as well as other adaptive techniques are and issues related to RSD, depression, and anxiety.  Participation Level:   Active  Participation Quality:  Appropriate      Behavioral Observation:  Well Groomed, Alert, and Blunt.   Current Psychosocial Factors: The patient reports that she has had a lot of difficulty over the past couple of weeks with the development of a recurrence of severe migraine issues.  The patient reports that this is limited some of the aspects we have been working on as far as therapeutic activities.  The patient reports that [facility] but has worked on getting the information.  She reports that this past week was essentially consumed by a prolonged severe migraine event.  The patient reports that she has been working on her diet to get more anti-inflammatory types of foods and less processed foods.  The patient reports that she has some very good things to look forward to over Easter with her daughter and granddaughter coming to visit her.  The patient reports that the situation between her and her husband continues to be good and he remains quite supportive although he does not know exactly what to do for her.  Content of Session:   Reviewed current symptoms and continue to work on therapeutic interventions related to issues around primary stressors, issues with her marriage and her husband being frustrated on  knowing what to do, medical issues along with memory and cognitive changes.  Current Status:   The patient reports that she continues to have significant headaches and she has had a nearly week long severe migraine event.  She attempted to use Maxalt but she had woken up with the headache to begin with and if she does not take it during the predromal phase this medication is not very effective.  Patient  Progress:   Some mild improvements particularly around issues related to her coping mechanisms.  Target Goals:   Target goals include continuing to work on improving her adaptive skills and adjustment to chronic pain and significant orthopedic issues along with RSD.  Last Reviewed:   05/24/2017  Impression/Diagnosis:   The patient has a long history of significant depression anxiety as well as panic attacks. She is ongoing issues with RSD and chronic pain to she has had numerous orthopedic issues throughout her life. The patient has started taking Celexa which was prescribed by Dr. Tessa Lerner to help with pain and depression. She reports that it is helping some and she does not have as many crying spells  Diagnosis:   Chronic bilateral thoracic back pain  Degenerative disc disease, lumbar  Lumbar facet arthropathy  Chronic pain syndrome  Generalized anxiety disorder  Reactive depression

## 2017-05-28 ENCOUNTER — Telehealth: Payer: Self-pay | Admitting: Registered Nurse

## 2017-05-28 NOTE — Telephone Encounter (Signed)
Return [name] call, I spoke with Dr Naaman Plummer regarding the [facility]. He wants [name] to continue with the Embeda. Explain to [name] she has to be willing to adjust to the medication change. She was instructed to call on May 1 st, to evaluate, she verbalizes understanding.

## 2017-06-06 ENCOUNTER — Telehealth: Payer: Self-pay

## 2017-06-06 MED ORDER — MORPHINE-NALTREXONE 100-4 MG PO CPCR: 100.0000 mg | ORAL_CAPSULE | Freq: Two times a day (BID) | ORAL | 0 refills | Status: DC

## 2017-06-06 NOTE — Telephone Encounter (Signed)
Pt called stating she is to talk to you today.

## 2017-06-06 NOTE — Telephone Encounter (Signed)
Return [name] call,  She reports the Embeda last between 8-10 hours she's using her oxycodone for break through pain, but her pain is not controlled. Spoke with Dr. Naaman Plummer regarding the above, he agrees with plan. Her Oxycodone will be increase to 6 times a day until her next appointment. We will resume Oxycodone 15 mg Q6 hours as needed for pain. Embeda will remain the same. [name] understanding.   [name] was asked to count Oxycodone she reports she has 84 tablets, this will last until her next scheduled appointment.

## 2017-06-12 ENCOUNTER — Other Ambulatory Visit: Payer: Self-pay | Admitting: Registered Nurse

## 2017-06-18 ENCOUNTER — Encounter: Payer: Self-pay | Admitting: Registered Nurse

## 2017-06-18 ENCOUNTER — Encounter: Payer: Medicare Other | Attending: Physical Medicine and Rehabilitation | Admitting: Registered Nurse

## 2017-06-18 VITALS — BP 124/87 | HR 97 | Ht 68.0 in | Wt 270.0 lb

## 2017-06-18 DIAGNOSIS — Z760 Encounter for issue of repeat prescription: Secondary | ICD-10-CM | POA: Insufficient documentation

## 2017-06-18 DIAGNOSIS — M47816 Spondylosis without myelopathy or radiculopathy, lumbar region: Secondary | ICD-10-CM | POA: Diagnosis not present

## 2017-06-18 DIAGNOSIS — M1712 Unilateral primary osteoarthritis, left knee: Secondary | ICD-10-CM | POA: Diagnosis not present

## 2017-06-18 DIAGNOSIS — G894 Chronic pain syndrome: Secondary | ICD-10-CM

## 2017-06-18 DIAGNOSIS — M7062 Trochanteric bursitis, left hip: Secondary | ICD-10-CM

## 2017-06-18 DIAGNOSIS — M546 Pain in thoracic spine: Secondary | ICD-10-CM

## 2017-06-18 DIAGNOSIS — M7918 Myalgia, other site: Secondary | ICD-10-CM

## 2017-06-18 DIAGNOSIS — G8929 Other chronic pain: Secondary | ICD-10-CM

## 2017-06-18 DIAGNOSIS — F329 Major depressive disorder, single episode, unspecified: Secondary | ICD-10-CM

## 2017-06-18 DIAGNOSIS — M7061 Trochanteric bursitis, right hip: Secondary | ICD-10-CM

## 2017-06-18 DIAGNOSIS — M1711 Unilateral primary osteoarthritis, right knee: Secondary | ICD-10-CM | POA: Diagnosis not present

## 2017-06-18 DIAGNOSIS — G43119 Migraine with aura, intractable, without status migrainosus: Secondary | ICD-10-CM | POA: Diagnosis not present

## 2017-06-18 DIAGNOSIS — M5136 Other intervertebral disc degeneration, lumbar region: Secondary | ICD-10-CM

## 2017-06-18 DIAGNOSIS — F411 Generalized anxiety disorder: Secondary | ICD-10-CM

## 2017-06-18 MED ORDER — MORPHINE-NALTREXONE 100-4 MG PO CPCR: 100.0000 mg | ORAL_CAPSULE | Freq: Two times a day (BID) | ORAL | 0 refills | Status: DC

## 2017-06-18 MED ORDER — OXYCODONE HCL 15 MG PO TABS: 15.0000 mg | ORAL_TABLET | Freq: Four times a day (QID) | ORAL | 0 refills | Status: DC | PRN

## 2017-06-18 MED ORDER — TIZANIDINE HCL 2 MG PO TABS: 2.0000 mg | ORAL_TABLET | Freq: Three times a day (TID) | ORAL | 5 refills | Status: DC | PRN

## 2017-06-18 NOTE — Progress Notes (Signed)
Subjective:    Patient ID: [name], female    DOB: [birth year], 57 y.o.   MRN: [id number]  HPI: [name] is a 57 year old female who returns for follow up appointment for chronic pain and medication and refill. She states her pain is located in her upper- lower back, bilateral hips and right knee. She rates her pain 8. Her current exercise regime is walking, performing stretching exercises with bands, chair exercises and light weights.  [name] states the Embeda only gives between 8-9 hours of relief, as of May 1.2019 we resume Oxycodone 60 mg per day. Today she will be prescribe Oxycodone 15 mg every 6 hours. The Embeda will remain the same, she verbalizes understanding.  [name] Morphine Equivalent is 291.67 MME.  Last Oral Swab was Performed on 02/26/2017, it was consistent.    Pain Inventory Average Pain 9 Pain Right Now 8 My pain is constant, sharp, burning, dull, stabbing, tingling and aching  In the last 24 hours, has pain interfered with the following? General activity 10 Relation with others 10 Enjoyment of life 10 What TIME of day is your pain at its worst? . Sleep (in general) Poor  Pain is worse with: walking, bending, standing and some activites Pain improves with: rest, heat/ice, therapy/exercise, medication, TENS and injections Relief from Meds: 5  Mobility use a cane ability to climb steps?  yes do you drive?  yes  Function disabled: date disabled . I need assistance with the following:  bathing, meal prep and household duties  Neuro/Psych weakness numbness tremor tingling trouble walking spasms dizziness confusion depression anxiety  Prior Studies Any changes since last visit?  no  Physicians involved in your care Any changes since last visit?  no   Family History  Problem Relation Age of Onset  . Depression Mother   . Hypertension Mother   . Hypertension Father   . Heart failure Father   . Diabetes  Father   . Heart attack Father   . Colon cancer Neg Hx   . Esophageal cancer Neg Hx   . Stomach cancer Neg Hx   . Rectal cancer Neg Hx    Social History   Socioeconomic History  . Marital status: Married    Spouse name: [name]  . Number of children: 1  . Years of education: some colle  . Highest education level: Not on file  Occupational History  . Occupation: On disability    Employer: UNEMPLOYED  Social Needs  . Financial resource strain: Not on file  . Food insecurity:    Worry: Not on file    Inability: Not on file  . Transportation needs:    Medical: Not on file    Non-medical: Not on file  Tobacco Use  . Smoking status: Former Smoker    Packs/day: 0.50    Types: Cigarettes    Last attempt to quit: 02/06/2016    Years since quitting: 1.3  . Smokeless tobacco: Never Used  . Tobacco comment: going to try e-sticks to quit  Substance and Sexual Activity  . Alcohol use: No    Alcohol/week: 0.0 oz  . Drug use: No  . Sexual activity: Not Currently    Partners: Male  Lifestyle  . Physical activity:    Days per week: Not on file    Minutes per session: Not on file  . Stress: Not on file  Relationships  . Social connections:    Talks on phone: Not  on file    Gets together: Not on file    Attends religious service: Not on file    Active member of club or organization: Not on file    Attends meetings of clubs or organizations: Not on file    Relationship status: Not on file  Other Topics Concern  . Not on file  Social History Narrative   8-10 cups of soda a day.  On disability. No regular exercise.     Past Surgical History:  Procedure Laterality Date  . ABDOMINAL HYSTERECTOMY  May 2004  . Basel Cell Carcinoma  06/2005, 07/2005   nasal tip and reconstruction  . CARPAL TUNNEL RELEASE  07/1993   both hands  . COLONOSCOPY    . HEMORROIDECTOMY  July 2003  . KNEE ARTHROPLASTY  09/2001, 05/2003   left knee, chondromalasia  . KNEE ARTHROSCOPY  jan 2005   Right  knee, tisse release, chrondromalacia  . REPLACEMENT TOTAL KNEE  Nov 2005   Left   . THYROID LOBECTOMY  01/2005   malignant area removed from right lobe  . THYROID SURGERY     thyroid lobe removal  . TONSILLECTOMY  09/1972  . TUBAL LIGATION  Sept 1999  . Ulnar nerve entrapment  Feb 1995   left elbow   Past Medical History:  Diagnosis Date  . Allergy   . Anxiety   . Cancer ([place])    cancerous nodules on thyroid  . Cancer ([place])    basal cell Cancer-nose  . Chronic pain syndrome   . COPD (chronic obstructive pulmonary disease) ([place])   . Degeneration of lumbar or lumbosacral intervertebral disc   . Depression   . Facet syndrome, lumbar   . GERD (gastroesophageal reflux disease)   . Hyperlipidemia   . Intervertebral lumbar disc disorder with myelopathy, lumbar region   . Lumbosacral spondylosis without myelopathy   . Migraine without aura, with intractable migraine, so stated, without mention of status migrainosus   . Primary localized osteoarthrosis, lower leg   . Thyroid disease    hypothyroid  . Unspecified musculoskeletal disorders and symptoms referable to neck    cervical/trapezius   There were no vitals taken for this visit.  Opioid Risk Score:   Fall Risk Score:  `1  Depression screen PHQ 2/9  Depression screen [facility] 2/9 10/25/2016 08/25/2016 07/14/2016 11/10/2015 09/06/2015 03/16/2015 09/17/2014  Decreased Interest 3 3 3 3 3 3 3   Down, Depressed, Hopeless 3 3 3 3  - 3 3  PHQ - 2 Score 6 6 6 6 3 6 6   Altered sleeping - - - - - - -  Tired, decreased energy - - - - - - -  Change in appetite - - - - - - -  Feeling bad or failure about yourself  - - - - - - -  Trouble concentrating - - - - - - -  Moving slowly or fidgety/restless - - - - - - -  Suicidal thoughts - - - - - - -  PHQ-9 Score - - - - - - -     Review of Systems  Constitutional: Positive for diaphoresis and unexpected weight change.  HENT: Negative.   Eyes: Negative.   Respiratory: Negative.     Cardiovascular: Negative.   Gastrointestinal: Positive for abdominal pain, nausea and vomiting.  Endocrine: Negative.   Genitourinary: Negative.   Musculoskeletal: Positive for joint swelling.  Skin: Negative.   Allergic/Immunologic: Negative.   Neurological: Positive for dizziness, tremors, weakness and  numbness.  Hematological: Negative.   Psychiatric/Behavioral: Positive for confusion and dysphoric mood. The patient is nervous/anxious.   All other systems reviewed and are negative.      Objective:   Physical Exam  Constitutional: She appears well-developed and well-nourished.  HENT:  Head: Normocephalic and atraumatic.  Neck: Normal range of motion. Neck supple.  Cardiovascular: Normal rate and regular rhythm.  Pulmonary/Chest: Effort normal and breath sounds normal.  Musculoskeletal:  Normal Muscle Bulk and Muscle Testing Reveals: Upper Extremities: Full ROM and Muscle Strength 5/5 Thoracic Hypersensitivity: T-1-T-7 Bilateral Greater Trochanter Tenderness Lower Extremities: Decreased ROM and Muscle Strength 4/5 Right Lower Extremity Flexion Produces Pain into Right Patella Arises from Table Slowly, using cane for support Antalgic Gait   Skin: Skin is warm and dry.  Psychiatric: She has a normal mood and affect.  Nursing note and vitals reviewed.         Assessment & Plan:  1.Chronic Bilateral Thoracic Back Pain/ Low Back Pain/ Lumbar Facet Arthropathy: RefilledOxycodone 15mg  one tablet every 6 hours as needed #120 and Embeda 100 mg every 12 hours #60. Continue Gabapentin and Pamelor. 06/18/2017 We will continue the opioid monitoring program, this consists of regular clinic visits, examinations, urine drug screen, pill counts as well as use of [place] Controlled Substance Reporting System. 2. Degenerative Disk Disease:Encouraged Continue HEP as Tolertated  and heat therapy. Continue Current Medication Regime.06/18/2017 3. Bilateral Greater Trochanteric  Bursitis: Continue current treatment regimen with Heat and Ice Therapy.06/18/2017 4. Osteoarthritis of Bilateral Knees: Continue current treatment modality with home exercise program and heat therapy. 06/18/2017 5. Migraine Headaches: Continue current medication regime with Maxalt. 06/18/2017 6. Smoking Cessation: She has quit smokingsince 08/25/2016. Continue to Monitor. 06/18/2017 7.Constipation: Continue  Current medication regime with Linzess.06/18/2017 8. Muscle Spasm: Continue current medication regime with Tizanidine.06/18/2017 9. DepressionAnxiety/ Panic Attacks: Continue current medication regimen and treatment modality.  Celexaand Counseling with  Dr. Sima Matas.PsychologistFollowing. 06/18/2017 10. RSD: Continue with current medication Regime.05/18/2017  30 minutes of face to face patient care time was spent during this visit. All questions were encouraged and answered.   F/U in 69month

## 2017-07-12 ENCOUNTER — Other Ambulatory Visit: Payer: Self-pay | Admitting: Physical Medicine & Rehabilitation

## 2017-07-12 ENCOUNTER — Other Ambulatory Visit: Payer: Self-pay | Admitting: Registered Nurse

## 2017-07-16 ENCOUNTER — Encounter: Payer: Self-pay | Admitting: Physical Medicine & Rehabilitation

## 2017-07-20 ENCOUNTER — Encounter: Payer: Medicare Other | Admitting: Psychology

## 2017-07-30 ENCOUNTER — Encounter: Payer: Medicare Other | Attending: Physical Medicine and Rehabilitation | Admitting: Physical Medicine & Rehabilitation

## 2017-07-30 ENCOUNTER — Encounter: Payer: Self-pay | Admitting: Physical Medicine & Rehabilitation

## 2017-07-30 VITALS — BP 122/84 | HR 97 | Ht 68.0 in | Wt 257.6 lb

## 2017-07-30 DIAGNOSIS — Z79899 Other long term (current) drug therapy: Secondary | ICD-10-CM | POA: Diagnosis not present

## 2017-07-30 DIAGNOSIS — Z760 Encounter for issue of repeat prescription: Secondary | ICD-10-CM | POA: Insufficient documentation

## 2017-07-30 DIAGNOSIS — M1712 Unilateral primary osteoarthritis, left knee: Secondary | ICD-10-CM

## 2017-07-30 DIAGNOSIS — Z5181 Encounter for therapeutic drug level monitoring: Secondary | ICD-10-CM

## 2017-07-30 DIAGNOSIS — G894 Chronic pain syndrome: Secondary | ICD-10-CM

## 2017-07-30 DIAGNOSIS — M1711 Unilateral primary osteoarthritis, right knee: Secondary | ICD-10-CM | POA: Diagnosis not present

## 2017-07-30 DIAGNOSIS — G43119 Migraine with aura, intractable, without status migrainosus: Secondary | ICD-10-CM

## 2017-07-30 MED ORDER — MORPHINE-NALTREXONE 100-4 MG PO CPCR: 100.0000 mg | ORAL_CAPSULE | Freq: Two times a day (BID) | ORAL | 0 refills | Status: DC

## 2017-07-30 MED ORDER — MORPHINE SULFATE 15 MG PO TABS: 15.0000 mg | ORAL_TABLET | Freq: Four times a day (QID) | ORAL | 0 refills | Status: DC | PRN

## 2017-07-30 NOTE — Progress Notes (Signed)
Subjective:    Patient ID: [name], female    DOB: October 01, 1960, 57 y.o.   MRN: [id number]  HPI   [name] is here in follow up of her chronic pain.  We have changed her to [facility] for her pain control but she feels that the medication is only lasting 8 or 9 hours.  She has oxycodone 15 mg for breakthrough pain which she uses.  She is not always taking it through the.  Of time when the morphine wears off.  She has continued pain in both knees.  Her back bothers her as well.  She has had problems with her mood and depression.  She recently lost her mother which has not helped things.  She continues to see Dr. Sima Matas for pain and coping skills.  She lost 15 pounds with the loss of her mother as she lost her appetite as well.  She has not been able to exercise as much as she had wanted to.  She had hoped to be getting into the pool at the [facility].  Pain Inventory Average Pain 9 Pain Right Now 8 My pain is constant, sharp, burning, dull, stabbing, tingling and aching  In the last 24 hours, has pain interfered with the following? General activity 10 Relation with others 9 Enjoyment of life 9 What TIME of day is your pain at its worst? . Sleep (in general) Poor  Pain is worse with: walking, bending, standing and some activites Pain improves with: rest, heat/ice, therapy/exercise, medication, TENS and injections Relief from Meds: 5  Mobility use a cane use a walker ability to climb steps?  yes do you drive?  no  Function disabled: date disabled .  Neuro/Psych weakness numbness tremor tingling trouble walking spasms dizziness confusion depression anxiety  Prior Studies Any changes since last visit?  no  Physicians involved in your care Any changes since last visit?  no   Family History  Problem Relation Age of Onset  . Depression Mother   . Hypertension Mother   . Hypertension Father   . Heart failure Father   . Diabetes Father   . Heart attack Father   .  Colon cancer Neg Hx   . Esophageal cancer Neg Hx   . Stomach cancer Neg Hx   . Rectal cancer Neg Hx    Social History   Socioeconomic History  . Marital status: Married    Spouse name: [name]  . Number of children: 1  . Years of education: some colle  . Highest education level: Not on file  Occupational History  . Occupation: On disability    Employer: UNEMPLOYED  Social Needs  . Financial resource strain: Not on file  . Food insecurity:    Worry: Not on file    Inability: Not on file  . Transportation needs:    Medical: Not on file    Non-medical: Not on file  Tobacco Use  . Smoking status: Former Smoker    Packs/day: 0.50    Types: Cigarettes    Last attempt to quit: 02/06/2016    Years since quitting: 1.4  . Smokeless tobacco: Never Used  . Tobacco comment: going to try e-sticks to quit  Substance and Sexual Activity  . Alcohol use: No    Alcohol/week: 0.0 oz  . Drug use: No  . Sexual activity: Not Currently    Partners: Male  Lifestyle  . Physical activity:    Days per week: Not on file    Minutes  per session: Not on file  . Stress: Not on file  Relationships  . Social connections:    Talks on phone: Not on file    Gets together: Not on file    Attends religious service: Not on file    Active member of club or organization: Not on file    Attends meetings of clubs or organizations: Not on file    Relationship status: Not on file  Other Topics Concern  . Not on file  Social History Narrative   8-10 cups of soda a day.  On disability. No regular exercise.     Past Surgical History:  Procedure Laterality Date  . ABDOMINAL HYSTERECTOMY  May 2004  . Basel Cell Carcinoma  06/2005, 07/2005   nasal tip and reconstruction  . CARPAL TUNNEL RELEASE  07/1993   both hands  . COLONOSCOPY    . HEMORROIDECTOMY  July 2003  . KNEE ARTHROPLASTY  09/2001, 05/2003   left knee, chondromalasia  . KNEE ARTHROSCOPY  jan 2005   Right knee, tisse release, chrondromalacia  .  REPLACEMENT TOTAL KNEE  Nov 2005   Left   . THYROID LOBECTOMY  01/2005   malignant area removed from right lobe  . THYROID SURGERY     thyroid lobe removal  . TONSILLECTOMY  09/1972  . TUBAL LIGATION  Sept 1999  . Ulnar nerve entrapment  Feb 1995   left elbow   Past Medical History:  Diagnosis Date  . Allergy   . Anxiety   . Cancer ([place])    cancerous nodules on thyroid  . Cancer ([place])    basal cell Cancer-nose  . Chronic pain syndrome   . COPD (chronic obstructive pulmonary disease) ([place])   . Degeneration of lumbar or lumbosacral intervertebral disc   . Depression   . Facet syndrome, lumbar   . GERD (gastroesophageal reflux disease)   . Hyperlipidemia   . Intervertebral lumbar disc disorder with myelopathy, lumbar region   . Lumbosacral spondylosis without myelopathy   . Migraine without aura, with intractable migraine, so stated, without mention of status migrainosus   . Primary localized osteoarthrosis, lower leg   . Thyroid disease    hypothyroid  . Unspecified musculoskeletal disorders and symptoms referable to neck    cervical/trapezius   BP 122/84   Pulse 97   Ht 5\' 8"  (1.727 m)   Wt 257 lb 9.6 oz (116.8 kg)   SpO2 94%   BMI 39.17 kg/m   Opioid Risk Score:   Fall Risk Score:  `1  Depression screen PHQ 2/9  Depression screen [facility] 2/9 10/25/2016 08/25/2016 07/14/2016 11/10/2015 09/06/2015 03/16/2015 09/17/2014  Decreased Interest 3 3 3 3 3 3 3   Down, Depressed, Hopeless 3 3 3 3  - 3 3  PHQ - 2 Score 6 6 6 6 3 6 6   Altered sleeping - - - - - - -  Tired, decreased energy - - - - - - -  Change in appetite - - - - - - -  Feeling bad or failure about yourself  - - - - - - -  Trouble concentrating - - - - - - -  Moving slowly or fidgety/restless - - - - - - -  Suicidal thoughts - - - - - - -  PHQ-9 Score - - - - - - -     Review of Systems  Constitutional: Positive for appetite change, diaphoresis and unexpected weight change.  HENT: Negative.   Eyes: Negative.  Respiratory: Negative.   Cardiovascular: Negative.   Gastrointestinal: Positive for nausea and vomiting.  Endocrine: Negative.   Genitourinary: Negative.   Musculoskeletal: Positive for arthralgias, back pain, gait problem and myalgias.  Skin: Negative.   Allergic/Immunologic: Negative.   Neurological: Positive for dizziness, tremors, weakness and numbness.  Hematological: Negative.   Psychiatric/Behavioral: Positive for dysphoric mood. The patient is nervous/anxious.   All other systems reviewed and are negative.      Objective:   Physical Exam  General: No acute distress.  Obese HEENT: EOMI, oral membranes moist Cards: reg rate  Chest: normal effort Abdomen: Soft, NT, ND Skin: dry, intact Extremities: no edema  Musculoskeletal: She exhibits ongoing edema bilateral knees.  She has pain with resisted extension and flexion bilateral knees and walks with antalgia more on the right than left today.  Uses a cane for offloading. Neurological: normal exam Psychiatry: Pleasant as always although became tearful at times and at times appears depressed.          1.Chronic Low Back Pain:  -continue embeda 100mg  q12, #60  -change oxycodone to ms IR 15mg  q6 prn #120 and we discussed using this more in a targeted fashion around the times when her Embeda wears off. -We will continue the opioid monitoring program, this consists of regular clinic visits, examinations, routine drug screening, pill counts as well as use of [place] Controlled Substance Reporting System. [place] was [place] today.   -Drug swab today            discussed opioid hyperalgesia once again today  -continued neuropsych efforts as this continues to play a large role  -?spinal stimulator  -Vitamin D level was discussed.  We can check this if needed here in our office 2. Degenerative Disk Disease:Continue exercise, and heat therapy.  3. Osteoarthritis of Bilateral Knees: Continue exercise  and HEP -Continue compression stockings and edema control 4. Migraine Headaches: On Maxalt.  5. Smoking Cessation:3 months plus cessation! 6. Constipation: Continue Linzess 7. Muscle Spasm: Continue Tizanidine 8. Chronic depression: celexa. Continue counseling per [place] as above.  This appears to be assisting her with coping skills. -Still needs to improve sleep habits.     -She will discuss vitamin D level with her primary   15 minutes of face to face patient care time was spent during this visit. All questions were encouraged and answered.    Follow-up with nurse practitioner in about a month

## 2017-07-30 NOTE — Patient Instructions (Signed)
PLEASE FEEL FREE TO CALL OUR OFFICE WITH ANY PROBLEMS OR QUESTIONS ([phone])      

## 2017-08-04 LAB — DRUG TOX MONITOR 1 W/CONF, ORAL FLD
Amphetamines: NEGATIVE ng/mL (ref <10)
Barbiturates: NEGATIVE ng/mL (ref <10)
Benzodiazepines: NEGATIVE ng/mL (ref <0.50)
Buprenorphine: NEGATIVE ng/mL (ref <0.10)
CODEINE: NEGATIVE ng/mL (ref <2.5)
Cocaine: NEGATIVE ng/mL (ref <5.0)
DIHYDROCODEINE: NEGATIVE ng/mL (ref <2.5)
Fentanyl: NEGATIVE ng/mL (ref <0.10)
Heroin Metabolite: NEGATIVE ng/mL (ref <1.0)
Hydrocodone: NEGATIVE ng/mL (ref <2.5)
Hydromorphone: NEGATIVE ng/mL (ref <2.5)
MARIJUANA: NEGATIVE ng/mL (ref <2.5)
MDMA: NEGATIVE ng/mL (ref <10)
MEPROBAMATE: NEGATIVE ng/mL (ref <2.5)
METHADONE: NEGATIVE ng/mL (ref <5.0)
Morphine: 79.3 ng/mL — ABNORMAL HIGH (ref <2.5)
NOROXYCODONE: NEGATIVE ng/mL (ref <2.5)
Nicotine Metabolite: NEGATIVE ng/mL (ref <5.0)
Norhydrocodone: NEGATIVE ng/mL (ref <2.5)
OPIATES: POSITIVE ng/mL — AB (ref <2.5)
OXYCODONE: NEGATIVE ng/mL (ref <2.5)
Oxymorphone: NEGATIVE ng/mL (ref <2.5)
PHENCYCLIDINE: NEGATIVE ng/mL (ref <10)
TAPENTADOL: NEGATIVE ng/mL (ref <5.0)
TRAMADOL: NEGATIVE ng/mL (ref <5.0)
Zolpidem: NEGATIVE ng/mL (ref <5.0)

## 2017-08-04 LAB — DRUG TOX ALC METAB W/CON, ORAL FLD: Alcohol Metabolite: NEGATIVE ng/mL (ref <25)

## 2017-08-06 ENCOUNTER — Telehealth: Payer: Self-pay | Admitting: *Deleted

## 2017-08-06 NOTE — Telephone Encounter (Signed)
Oral swab drug screen positive for Morphine but negative for oxycodone and its metabolites. Reported last dose of oxy was night before test. Historically she has always been consistent on urine drug screens. Would recommend future tests be on urine.

## 2017-08-11 ENCOUNTER — Other Ambulatory Visit: Payer: Self-pay | Admitting: Physical Medicine & Rehabilitation

## 2017-08-13 NOTE — Telephone Encounter (Signed)
Refill request for Gabapentin. Last note does not say continue. Is it okay to refill?

## 2017-08-22 ENCOUNTER — Other Ambulatory Visit: Payer: Self-pay

## 2017-08-22 MED ORDER — GABAPENTIN 800 MG PO TABS: 800.0000 mg | ORAL_TABLET | Freq: Four times a day (QID) | ORAL | 2 refills | Status: DC

## 2017-08-29 ENCOUNTER — Encounter: Payer: Medicare Other | Attending: Physical Medicine and Rehabilitation | Admitting: Registered Nurse

## 2017-08-29 ENCOUNTER — Encounter: Payer: Self-pay | Admitting: Registered Nurse

## 2017-08-29 VITALS — BP 128/71 | HR 98 | Resp 14 | Ht 68.0 in | Wt 266.0 lb

## 2017-08-29 DIAGNOSIS — M47816 Spondylosis without myelopathy or radiculopathy, lumbar region: Secondary | ICD-10-CM

## 2017-08-29 DIAGNOSIS — Z760 Encounter for issue of repeat prescription: Secondary | ICD-10-CM | POA: Diagnosis not present

## 2017-08-29 DIAGNOSIS — M1711 Unilateral primary osteoarthritis, right knee: Secondary | ICD-10-CM | POA: Diagnosis not present

## 2017-08-29 DIAGNOSIS — M7918 Myalgia, other site: Secondary | ICD-10-CM

## 2017-08-29 DIAGNOSIS — G43119 Migraine with aura, intractable, without status migrainosus: Secondary | ICD-10-CM | POA: Diagnosis not present

## 2017-08-29 DIAGNOSIS — M51369 Other intervertebral disc degeneration, lumbar region without mention of lumbar back pain or lower extremity pain: Secondary | ICD-10-CM

## 2017-08-29 DIAGNOSIS — Z5181 Encounter for therapeutic drug level monitoring: Secondary | ICD-10-CM

## 2017-08-29 DIAGNOSIS — M546 Pain in thoracic spine: Secondary | ICD-10-CM

## 2017-08-29 DIAGNOSIS — M5136 Other intervertebral disc degeneration, lumbar region: Secondary | ICD-10-CM

## 2017-08-29 DIAGNOSIS — G894 Chronic pain syndrome: Secondary | ICD-10-CM

## 2017-08-29 DIAGNOSIS — M1712 Unilateral primary osteoarthritis, left knee: Secondary | ICD-10-CM

## 2017-08-29 DIAGNOSIS — M7061 Trochanteric bursitis, right hip: Secondary | ICD-10-CM

## 2017-08-29 DIAGNOSIS — Z79899 Other long term (current) drug therapy: Secondary | ICD-10-CM

## 2017-08-29 DIAGNOSIS — M7062 Trochanteric bursitis, left hip: Secondary | ICD-10-CM

## 2017-08-29 DIAGNOSIS — G8929 Other chronic pain: Secondary | ICD-10-CM

## 2017-08-29 MED ORDER — MORPHINE-NALTREXONE 100-4 MG PO CPCR: 100.0000 mg | ORAL_CAPSULE | Freq: Two times a day (BID) | ORAL | 0 refills | Status: DC

## 2017-08-29 MED ORDER — MORPHINE SULFATE 15 MG PO TABS: 15.0000 mg | ORAL_TABLET | Freq: Four times a day (QID) | ORAL | 0 refills | Status: DC | PRN

## 2017-08-29 NOTE — Progress Notes (Signed)
Subjective:    Patient ID: [name], female    DOB: 09-08-1960, 57 y.o.   MRN: [id number]  HPI: [name] is a 57 year old female who returns for follow up appointment for chronic pain and medication refill. She states her pain is located in her mid- lower back, bilateral hips and bilateral knees. She rates her pain 6. Also reports the regimen Dr. Naaman Plummer instructed her on has been working and her pain has been managed.   Reports increase frequency and intensity of Migraines, she had noticed period of dizziness. She denies dizziness at this time. She refuses Neurology consult at this time. We discuss keeping a Migraine Journal and  [place], she would like for this provider to discuss with  Dr. Naaman Plummer. I will call [name] after discussing with Dr. Naaman Plummer next week, he is on vacation this week. She verbalizes understanding.   [name] Equivalent is 260.00 MME. Last Oral Swab was Performed on 07/30/2017. See note for detail.   Pain Inventory Average Pain 8 Pain Right Now 6 My pain is constant, sharp, burning, dull, stabbing, tingling and aching  In the last 24 hours, has pain interfered with the following? General activity 9 Relation with others 10 Enjoyment of life 10 What TIME of day is your pain at its worst? all Sleep (in general) Poor  Pain is worse with: walking, bending, standing and some activites Pain improves with: rest, heat/ice, therapy/exercise, medication, TENS and injections Relief from Meds: 7  Mobility walk with assistance use a cane use a walker ability to climb steps?  yes do you drive?  yes Do you have any goals in this area?  yes  Function not employed: date last employed . disabled: date disabled . I need assistance with the following:  bathing, meal prep, household duties and shopping Do you have any goals in this area?  yes  Neuro/Psych weakness numbness tremor tingling trouble  walking spasms dizziness confusion depression  Prior Studies Any changes since last visit?  no  Physicians involved in your care Any changes since last visit?  no   Family History  Problem Relation Age of Onset  . Depression Mother   . Hypertension Mother   . Hypertension Father   . Heart failure Father   . Diabetes Father   . Heart attack Father   . Colon cancer Neg Hx   . Esophageal cancer Neg Hx   . Stomach cancer Neg Hx   . Rectal cancer Neg Hx    Social History   Socioeconomic History  . Marital status: Married    Spouse name: [name]  . Number of children: 1  . Years of education: some colle  . Highest education level: Not on file  Occupational History  . Occupation: On disability    Employer: UNEMPLOYED  Social Needs  . Financial resource strain: Not on file  . Food insecurity:    Worry: Not on file    Inability: Not on file  . Transportation needs:    Medical: Not on file    Non-medical: Not on file  Tobacco Use  . Smoking status: Former Smoker    Packs/day: 0.50    Types: Cigarettes    Last attempt to quit: 02/06/2016    Years since quitting: 1.5  . Smokeless tobacco: Never Used  . Tobacco comment: going to try e-sticks to quit  Substance and Sexual Activity  . Alcohol use: No    Alcohol/week: 0.0 oz  .  Drug use: No  . Sexual activity: Not Currently    Partners: Male  Lifestyle  . Physical activity:    Days per week: Not on file    Minutes per session: Not on file  . Stress: Not on file  Relationships  . Social connections:    Talks on phone: Not on file    Gets together: Not on file    Attends religious service: Not on file    Active member of club or organization: Not on file    Attends meetings of clubs or organizations: Not on file    Relationship status: Not on file  Other Topics Concern  . Not on file  Social History Narrative   8-10 cups of soda a day.  On disability. No regular exercise.     Past Surgical History:   Procedure Laterality Date  . ABDOMINAL HYSTERECTOMY  May 2004  . Basel Cell Carcinoma  06/2005, 07/2005   nasal tip and reconstruction  . CARPAL TUNNEL RELEASE  07/1993   both hands  . COLONOSCOPY    . HEMORROIDECTOMY  July 2003  . KNEE ARTHROPLASTY  09/2001, 05/2003   left knee, chondromalasia  . KNEE ARTHROSCOPY  jan 2005   Right knee, tisse release, chrondromalacia  . REPLACEMENT TOTAL KNEE  Nov 2005   Left   . THYROID LOBECTOMY  01/2005   malignant area removed from right lobe  . THYROID SURGERY     thyroid lobe removal  . TONSILLECTOMY  09/1972  . TUBAL LIGATION  Sept 1999  . Ulnar nerve entrapment  Feb 1995   left elbow   Past Medical History:  Diagnosis Date  . Allergy   . Anxiety   . Cancer ([place])    cancerous nodules on thyroid  . Cancer ([place])    basal cell Cancer-nose  . Chronic pain syndrome   . COPD (chronic obstructive pulmonary disease) ([place])   . Degeneration of lumbar or lumbosacral intervertebral disc   . Depression   . Facet syndrome, lumbar   . GERD (gastroesophageal reflux disease)   . Hyperlipidemia   . Intervertebral lumbar disc disorder with myelopathy, lumbar region   . Lumbosacral spondylosis without myelopathy   . Migraine without aura, with intractable migraine, so stated, without mention of status migrainosus   . Primary localized osteoarthrosis, lower leg   . Thyroid disease    hypothyroid  . Unspecified musculoskeletal disorders and symptoms referable to neck    cervical/trapezius   BP 128/71 (BP Location: Right Arm, Patient Position: Sitting, Cuff Size: Large)   Pulse 98   Resp 14   Ht 5\' 8"  (1.727 m)   Wt 266 lb (120.7 kg)   SpO2 93%   BMI 40.45 kg/m   Opioid Risk Score:   Fall Risk Score:  `1  Depression screen PHQ 2/9  Depression screen [facility] 2/9 10/25/2016 08/25/2016 07/14/2016 11/10/2015 09/06/2015 03/16/2015 09/17/2014  Decreased Interest 3 3 3 3 3 3 3   Down, Depressed, Hopeless 3 3 3 3  - 3 3  PHQ - 2 Score 6 6 6 6 3 6 6   Altered  sleeping - - - - - - -  Tired, decreased energy - - - - - - -  Change in appetite - - - - - - -  Feeling bad or failure about yourself  - - - - - - -  Trouble concentrating - - - - - - -  Moving slowly or fidgety/restless - - - - - - -  Suicidal thoughts - - - - - - -  PHQ-9 Score - - - - - - -    Review of Systems  Constitutional: Positive for appetite change, diaphoresis and unexpected weight change.  HENT: Negative.   Eyes: Negative.   Respiratory: Negative.   Cardiovascular: Positive for leg swelling.  Gastrointestinal: Positive for nausea and vomiting.  Endocrine: Negative.   Genitourinary: Negative.   Musculoskeletal: Positive for arthralgias, back pain, gait problem, myalgias, neck pain and neck stiffness.       Spasms   Skin: Negative.   Allergic/Immunologic: Negative.   Neurological: Positive for dizziness, tremors, weakness, numbness and headaches.       Tingling  Psychiatric/Behavioral: Positive for confusion and dysphoric mood. The patient is nervous/anxious.        Objective:   Physical Exam  Constitutional: She is oriented to person, place, and time. She appears well-developed and well-nourished.  HENT:  Head: Normocephalic and atraumatic.  Neck: Normal range of motion. Neck supple.  Cardiovascular: Normal rate and regular rhythm.  Pulmonary/Chest: Effort normal and breath sounds normal.  Musculoskeletal:  Normal Muscle Bulk and Muscle Testing Reveals: Upper Extremities: Full ROM and Muscle Strength 5/5 Bilateral AC Joint Tenderness Thoracic Paraspinal Tenderness: T-7-T-9 Lumbar Paraspinal Tenderness: L-3-L-5 Bilateral Greater Trochanter Tenderness Lower Extremities: Decreased ROM and Muscle Strength 5/5 Left Lower Extremity Flexion Produces Pain into Left Patella Arises from Table Slowly Antalgic Gait  Neurological: She is alert and oriented to person, place, and time.  Skin: Skin is warm and dry.  Psychiatric: She has a normal mood and affect. Her  behavior is normal.  Nursing note and vitals reviewed.         Assessment & Plan:  1.Chronic Bilateral Thoracic Back Pain/ Low Back Pain/ Lumbar Facet Arthropathy: RefilledOxycodone 15mg  one tablet every 6 hours as needed #120 and Embeda 100 mg every 12 hours #60. Continue Gabapentin and Pamelor. 08/29/2017 We will continue the opioid monitoring program, this consists of regular clinic visits, examinations, urine drug screen, pill counts as well as use of [place] Controlled Substance Reporting System. 2. Degenerative Disk Disease:Encouraged Continue HEP as Tolertatedand heat therapy. Continue Current Medication Regime.08/29/2017 3. Bilateral Greater Trochanteric Bursitis: Continuecurrent treatment regimenwith Heat and Ice Therapy.06/18/2017 4. Osteoarthritis of Bilateral Knees: Continue current treatment modality with homeexerciseprogramand heat therapy. 08/29/2017 5. Migraine Headaches:  Instructed to keep Migraine Journal. Will Discuss with Dr. Rudi Coco. Continuecurrent medication regime withMaxalt. 06/18/2017 6. Smoking Cessation: She has quit smokingsince 08/25/2016. Continue to Monitor. 08/29/2017 7.Constipation: ContinueCurrent medication regime withLinzess.08/29/2017 8. Muscle Spasm: Continuecurrent medication regime withTizanidine.08/29/2017 9. DepressionAnxiety/ Panic Attacks: Continuecurrent medication regimen and treatment modality.Celexaand Counseling with [facility].PsychologistFollowing. 08/29/2017 10. RSD: Continue with current medication Regime.08/29/2017  30 minutes of face to face patient care time was spent during this visit. All questions were encouraged and answered.   F/U in 65month

## 2017-08-30 ENCOUNTER — Telehealth: Payer: Self-pay

## 2017-08-30 NOTE — Telephone Encounter (Signed)
Pt called to let you know that she has an appt with PCP on Monday.

## 2017-09-03 ENCOUNTER — Ambulatory Visit: Payer: Medicare Other | Admitting: Physician Assistant

## 2017-09-05 ENCOUNTER — Telehealth: Payer: Self-pay | Admitting: *Deleted

## 2017-09-05 MED ORDER — TOPIRAMATE 25 MG PO TABS: 25.0000 mg | ORAL_TABLET | Freq: Every day | ORAL | 2 refills | Status: DC

## 2017-09-05 NOTE — Telephone Encounter (Signed)
I called to ask how many migraines this month. She was asleep with a migraine but her husband thinks it is 3. I asked that she call us back when she wakes if it is more.

## 2017-09-05 NOTE — Telephone Encounter (Signed)
This provider spoke with Dr. Naaman Plummer regarding Ms. Mcclafferty increase frequency and intensity of migraines. We discussed Trokendi. Trokendi not on her formulary. We will prescribe topiramate and she has a allergy to Imitex. Placed a call to Ms. Kassa her husband states she is lying down due to a Migraine. Explained the above and was instructed to keep migraine journal, he verbalizes understanding.

## 2017-09-08 ENCOUNTER — Other Ambulatory Visit: Payer: Self-pay | Admitting: Family Medicine

## 2017-09-10 ENCOUNTER — Other Ambulatory Visit: Payer: Self-pay | Admitting: Physical Medicine & Rehabilitation

## 2017-09-11 ENCOUNTER — Encounter: Payer: Medicare Other | Admitting: Psychology

## 2017-09-20 ENCOUNTER — Other Ambulatory Visit: Payer: Self-pay | Admitting: Family Medicine

## 2017-09-24 ENCOUNTER — Encounter: Payer: Self-pay | Admitting: Registered Nurse

## 2017-09-24 ENCOUNTER — Encounter: Payer: Medicare Other | Attending: Physical Medicine and Rehabilitation | Admitting: Registered Nurse

## 2017-09-24 VITALS — BP 104/67 | HR 88 | Resp 14 | Ht 68.0 in | Wt 253.0 lb

## 2017-09-24 DIAGNOSIS — M5136 Other intervertebral disc degeneration, lumbar region: Secondary | ICD-10-CM

## 2017-09-24 DIAGNOSIS — G894 Chronic pain syndrome: Secondary | ICD-10-CM

## 2017-09-24 DIAGNOSIS — Z760 Encounter for issue of repeat prescription: Secondary | ICD-10-CM | POA: Diagnosis not present

## 2017-09-24 DIAGNOSIS — M47816 Spondylosis without myelopathy or radiculopathy, lumbar region: Secondary | ICD-10-CM | POA: Diagnosis not present

## 2017-09-24 DIAGNOSIS — Z79899 Other long term (current) drug therapy: Secondary | ICD-10-CM

## 2017-09-24 DIAGNOSIS — M1712 Unilateral primary osteoarthritis, left knee: Secondary | ICD-10-CM | POA: Diagnosis not present

## 2017-09-24 DIAGNOSIS — M546 Pain in thoracic spine: Secondary | ICD-10-CM

## 2017-09-24 DIAGNOSIS — M51369 Other intervertebral disc degeneration, lumbar region without mention of lumbar back pain or lower extremity pain: Secondary | ICD-10-CM

## 2017-09-24 DIAGNOSIS — M7062 Trochanteric bursitis, left hip: Secondary | ICD-10-CM

## 2017-09-24 DIAGNOSIS — Z5181 Encounter for therapeutic drug level monitoring: Secondary | ICD-10-CM

## 2017-09-24 DIAGNOSIS — M7918 Myalgia, other site: Secondary | ICD-10-CM

## 2017-09-24 DIAGNOSIS — M7061 Trochanteric bursitis, right hip: Secondary | ICD-10-CM | POA: Diagnosis not present

## 2017-09-24 DIAGNOSIS — G43119 Migraine with aura, intractable, without status migrainosus: Secondary | ICD-10-CM

## 2017-09-24 DIAGNOSIS — M1711 Unilateral primary osteoarthritis, right knee: Secondary | ICD-10-CM

## 2017-09-24 DIAGNOSIS — G8929 Other chronic pain: Secondary | ICD-10-CM

## 2017-09-24 MED ORDER — MORPHINE-NALTREXONE 100-4 MG PO CPCR: 100.0000 mg | ORAL_CAPSULE | Freq: Two times a day (BID) | ORAL | 0 refills | Status: DC

## 2017-09-24 MED ORDER — MORPHINE SULFATE 15 MG PO TABS: 15.0000 mg | ORAL_TABLET | Freq: Four times a day (QID) | ORAL | 0 refills | Status: DC | PRN

## 2017-09-24 NOTE — Progress Notes (Signed)
Subjective:    Patient ID: [name], female    DOB: February 27, 1960, 57 y.o.   MRN: [id number]  HPI: [name] is a 57 year old female who returns for follow up appointment for chronic pain and medication refill. She states her pain is located in her mid-lower back, bilateral hips and right knee. Also states her migraines have increase in intensity and frequency, she discontinued using the Topamax due to alopecia. She's currently on Maxalt, in the past she was described Inderal it was ineffective and allergic to Imitrex. This was discussed with Dr Naaman Plummer, we will prescribe Lamictal, call place to [name], she verbalizes understanding.   [name] Morphine Equivalent is 260.00 MME. Last Oral Swab was Performed on 07/30/2017, see note for details.    Pain Inventory Average Pain 8 Pain Right Now 7 My pain is constant, sharp, burning, dull, stabbing, tingling and aching  In the last 24 hours, has pain interfered with the following? General activity 10 Relation with others 10 Enjoyment of life 10 What TIME of day is your pain at its worst? all Sleep (in general) Poor  Pain is worse with: walking, bending, standing and some activites Pain improves with: rest, heat/ice, therapy/exercise, medication, TENS and injections Relief from Meds: 7  Mobility walk with assistance use a cane use a walker how many minutes can you walk? 5 ability to climb steps?  yes do you drive?  yes Do you have any goals in this area?  yes  Function disabled: date disabled . I need assistance with the following:  bathing, meal prep, household duties and shopping Do you have any goals in this area?  yes  Neuro/Psych weakness tremor tingling trouble walking spasms dizziness confusion depression anxiety  Prior Studies Any changes since last visit?  no  Physicians involved in your care Any changes since last visit?  no   Family History  Problem Relation Age of Onset   . Depression Mother   . Hypertension Mother   . Hypertension Father   . Heart failure Father   . Diabetes Father   . Heart attack Father   . Colon cancer Neg Hx   . Esophageal cancer Neg Hx   . Stomach cancer Neg Hx   . Rectal cancer Neg Hx    Social History   Socioeconomic History  . Marital status: Married    Spouse name: [name]  . Number of children: 1  . Years of education: some colle  . Highest education level: Not on file  Occupational History  . Occupation: On disability    Employer: UNEMPLOYED  Social Needs  . Financial resource strain: Not on file  . Food insecurity:    Worry: Not on file    Inability: Not on file  . Transportation needs:    Medical: Not on file    Non-medical: Not on file  Tobacco Use  . Smoking status: Former Smoker    Packs/day: 0.50    Types: Cigarettes    Last attempt to quit: 02/06/2016    Years since quitting: 1.6  . Smokeless tobacco: Never Used  . Tobacco comment: going to try e-sticks to quit  Substance and Sexual Activity  . Alcohol use: No    Alcohol/week: 0.0 standard drinks  . Drug use: No  . Sexual activity: Not Currently    Partners: Male  Lifestyle  . Physical activity:    Days per week: Not on file    Minutes per session:  Not on file  . Stress: Not on file  Relationships  . Social connections:    Talks on phone: Not on file    Gets together: Not on file    Attends religious service: Not on file    Active member of club or organization: Not on file    Attends meetings of clubs or organizations: Not on file    Relationship status: Not on file  Other Topics Concern  . Not on file  Social History Narrative   8-10 cups of soda a day.  On disability. No regular exercise.     Past Surgical History:  Procedure Laterality Date  . ABDOMINAL HYSTERECTOMY  May 2004  . Basel Cell Carcinoma  06/2005, 07/2005   nasal tip and reconstruction  . CARPAL TUNNEL RELEASE  07/1993   both hands  . COLONOSCOPY    .  HEMORROIDECTOMY  July 2003  . KNEE ARTHROPLASTY  09/2001, 05/2003   left knee, chondromalasia  . KNEE ARTHROSCOPY  jan 2005   Right knee, tisse release, chrondromalacia  . REPLACEMENT TOTAL KNEE  Nov 2005   Left   . THYROID LOBECTOMY  01/2005   malignant area removed from right lobe  . THYROID SURGERY     thyroid lobe removal  . TONSILLECTOMY  09/1972  . TUBAL LIGATION  Sept 1999  . Ulnar nerve entrapment  Feb 1995   left elbow   Past Medical History:  Diagnosis Date  . Allergy   . Anxiety   . Cancer ([place])    cancerous nodules on thyroid  . Cancer ([address])    basal cell Cancer-nose  . Chronic pain syndrome   . COPD (chronic obstructive pulmonary disease) ([place])   . Degeneration of lumbar or lumbosacral intervertebral disc   . Depression   . Facet syndrome, lumbar   . GERD (gastroesophageal reflux disease)   . Hyperlipidemia   . Intervertebral lumbar disc disorder with myelopathy, lumbar region   . Lumbosacral spondylosis without myelopathy   . Migraine without aura, with intractable migraine, so stated, without mention of status migrainosus   . Primary localized osteoarthrosis, lower leg   . Thyroid disease    hypothyroid  . Unspecified musculoskeletal disorders and symptoms referable to neck    cervical/trapezius   BP 104/67 (BP Location: Right Arm, Patient Position: Sitting, Cuff Size: Large)   Pulse 88   Resp 14   Ht 5\' 8"  (1.727 m)   Wt 253 lb (114.8 kg)   SpO2 96%   BMI 38.47 kg/m   Opioid Risk Score:   Fall Risk Score:  `1  Depression screen PHQ 2/9  Depression screen [facility] 2/9 10/25/2016 08/25/2016 07/14/2016 11/10/2015 09/06/2015 03/16/2015 09/17/2014  Decreased Interest 3 3 3 3 3 3 3   Down, Depressed, Hopeless 3 3 3 3  - 3 3  PHQ - 2 Score 6 6 6 6 3 6 6   Altered sleeping - - - - - - -  Tired, decreased energy - - - - - - -  Change in appetite - - - - - - -  Feeling bad or failure about yourself  - - - - - - -  Trouble concentrating - - - - - - -  Moving slowly or  fidgety/restless - - - - - - -  Suicidal thoughts - - - - - - -  PHQ-9 Score - - - - - - -  Some recent data might be hidden    Review of Systems  Constitutional: Positive for appetite change, chills and unexpected weight change.  HENT: Negative.   Eyes: Negative.   Cardiovascular: Positive for leg swelling.  Gastrointestinal: Positive for nausea and vomiting.  Endocrine: Negative.   Genitourinary: Negative.   Musculoskeletal: Positive for arthralgias, back pain, gait problem, myalgias and neck pain. Negative for neck stiffness.       Spasms  Skin: Negative.   Allergic/Immunologic: Negative.   Neurological: Positive for dizziness, tremors and weakness.       Tingling  Hematological: Negative.   Psychiatric/Behavioral: Positive for confusion and dysphoric mood. The patient is nervous/anxious.        Objective:   Physical Exam  Constitutional: She is oriented to person, place, and time. She appears well-developed and well-nourished.  HENT:  Head: Normocephalic and atraumatic.  Neck: Normal range of motion. Neck supple.  Cardiovascular: Normal rate and regular rhythm.  Pulmonary/Chest: Effort normal and breath sounds normal.  Musculoskeletal:  Normal Muscle Bulk and Muscle Testing Reveals: Upper Extremities: Full ROM and Muscle Strength 5/5 Thoracic Paraspinal Tenderness: T-7-T-9 Lumbar Paraspinal Tenderness: L-3-L-5 Bilateral Greater Trochanter Tenderness Lower Extremities: Decreased ROM and Muscle Strength 4/5 Right  Lower Extremity Flexion Produces Pain into Right Patella. Arises from Table Slowly Antalgic Gait  Neurological: She is alert and oriented to person, place, and time.  Skin: Skin is warm and dry.  Psychiatric: She has a normal mood and affect. Her behavior is normal.  Nursing note and vitals reviewed.         Assessment & Plan:  1.Chronic Bilateral Thoracic Back Pain/ Low Back Pain/ Lumbar Facet Arthropathy:RefilledOxycodone 15mg  one tablet every 6  hoursas needed #120and Embeda 100 mg every 12 hours #60.Continue Gabapentin and Pamelor. 09/24/2017 We will continue the opioid monitoring program, this consists of regular clinic visits, examinations, urine drug screen, pill counts as well as use of [place] Controlled Substance Reporting System. 2. Degenerative Disk Disease:Encouraged Continue HEP as Tolertatedand heat therapy. Continue Current Medication Regime.08/29/2017 3. Bilateral Greater Trochanteric Bursitis: Continuecurrent treatment regimenwith Heat and Ice Therapy.09/24/2017 4. Osteoarthritis of Bilateral Knees: Continue current treatment modality with homeexerciseprogramand heat therapy. 09/24/2017 5. Migraine Headaches:  Instructed to keep Migraine Journal. RX: Lamictal Continuecurrent medication regime withMaxalt. 09/24/2017 6. Smoking Cessation: She has quit smokingsince 08/25/2016. Continue to Monitor. 09/24/2017 7.Constipation: ContinueCurrent medication regime withLinzess.09/24/2017 8. Muscle Spasm: Continuecurrent medication regime withTizanidine.09/24/2017 9. DepressionAnxiety/ Panic Attacks: Continuecurrent medication regimen and treatment modality.Celexaand Counseling with [facility].PsychologistFollowing. 09/24/2017 10. RSD: Continue with current medication Regime.09/24/2017  30 minutes of face to face patient care time was spent during this visit. All questions were encouraged and answered.   F/U in65month

## 2017-09-27 ENCOUNTER — Other Ambulatory Visit: Payer: Self-pay | Admitting: Registered Nurse

## 2017-09-27 MED ORDER — LAMOTRIGINE 25 MG PO TABS: 25.0000 mg | ORAL_TABLET | Freq: Every day | ORAL | 1 refills | Status: DC

## 2017-10-05 ENCOUNTER — Encounter: Payer: Medicare Other | Admitting: Psychology

## 2017-10-09 ENCOUNTER — Ambulatory Visit: Payer: Medicare Other | Admitting: Family Medicine

## 2017-10-10 ENCOUNTER — Other Ambulatory Visit: Payer: Self-pay | Admitting: Registered Nurse

## 2017-10-11 ENCOUNTER — Ambulatory Visit (INDEPENDENT_AMBULATORY_CARE_PROVIDER_SITE_OTHER): Payer: Medicare Other | Admitting: Family Medicine

## 2017-10-11 ENCOUNTER — Telehealth: Payer: Self-pay | Admitting: Family Medicine

## 2017-10-11 ENCOUNTER — Encounter: Payer: Self-pay | Admitting: Family Medicine

## 2017-10-11 VITALS — BP 125/79 | HR 102 | Ht 68.0 in | Wt 255.0 lb

## 2017-10-11 DIAGNOSIS — L820 Inflamed seborrheic keratosis: Secondary | ICD-10-CM

## 2017-10-11 DIAGNOSIS — R7301 Impaired fasting glucose: Secondary | ICD-10-CM

## 2017-10-11 DIAGNOSIS — E7800 Pure hypercholesterolemia, unspecified: Secondary | ICD-10-CM | POA: Diagnosis not present

## 2017-10-11 DIAGNOSIS — E039 Hypothyroidism, unspecified: Secondary | ICD-10-CM | POA: Insufficient documentation

## 2017-10-11 MED ORDER — AMBULATORY NON FORMULARY MEDICATION: 0 refills | Status: DC

## 2017-10-11 NOTE — Progress Notes (Signed)
Subjective:    Patient ID: [name], female    DOB: Jan 05, 1961, 57 y.o.   MRN: [id number]  HPI 57 year old female needs multiple lesions on her back addressed today.  She is had a couple frozen before on her temples she still has 2 on her left temple and one on her right scalp that is quite large as well as several on her back that she would like to have frozen today.  Impaired fasting glucose-no increased thirst or urination. No symptoms consistent with hypoglycemia.  Hyperlipidemia-tolerating statin well without any side effects or problems.  She says she is very consistent uses a pillbox and takes it every day.  Hypothyroidism - Taking medication regularly in the AM away from food and vitamins, etc. No recent change to skin, hair, or energy levels.  She also let me know that she fell on Saturday.  She was going up the steps into her front door and says caught her toe on the edge of the step and fell forward.  She actually hit her nasal bridge on a chair hit both of her knees and then also has bruised her toes.  Does not think she has fractured anything but has had some bruising and swelling   Review of Systems  BP 125/79   Pulse (!) 102   Ht 5\' 8"  (1.727 m)   Wt 255 lb (115.7 kg)   SpO2 98%   BMI 38.77 kg/m     Allergies  Allergen Reactions  . Aspirin Other (See Comments)    Abdominal bleeding   . Ibuprofen Swelling  . Imitrex [Sumatriptan Base] Other (See Comments)    Drops BP   . Norvasc [Amlodipine Besylate] Swelling  . Nsaids Swelling  . Tape     Skin blisters under surgical tape  . Topamax [Topiramate] Other (See Comments)    Hair loss    Past Medical History:  Diagnosis Date  . Allergy   . Anxiety   . Cancer ([place])    cancerous nodules on thyroid  . Cancer ([place])    basal cell Cancer-nose  . Chronic pain syndrome   . COPD (chronic obstructive pulmonary disease) ([place])   . Degeneration of lumbar or lumbosacral intervertebral disc   . Depression    . Facet syndrome, lumbar   . GERD (gastroesophageal reflux disease)   . Hyperlipidemia   . Intervertebral lumbar disc disorder with myelopathy, lumbar region   . Lumbosacral spondylosis without myelopathy   . Migraine without aura, with intractable migraine, so stated, without mention of status migrainosus   . Primary localized osteoarthrosis, lower leg   . Thyroid disease    hypothyroid  . Unspecified musculoskeletal disorders and symptoms referable to neck    cervical/trapezius    Past Surgical History:  Procedure Laterality Date  . ABDOMINAL HYSTERECTOMY  May 2004  . Basel Cell Carcinoma  06/2005, 07/2005   nasal tip and reconstruction  . CARPAL TUNNEL RELEASE  07/1993   both hands  . COLONOSCOPY    . HEMORROIDECTOMY  July 2003  . KNEE ARTHROPLASTY  09/2001, 05/2003   left knee, chondromalasia  . KNEE ARTHROSCOPY  jan 2005   Right knee, tisse release, chrondromalacia  . REPLACEMENT TOTAL KNEE  Nov 2005   Left   . THYROID LOBECTOMY  01/2005   malignant area removed from right lobe  . THYROID SURGERY     thyroid lobe removal  . TONSILLECTOMY  09/1972  . TUBAL LIGATION  Sept 1999  .  Ulnar nerve entrapment  Feb 1995   left elbow    Social History   Socioeconomic History  . Marital status: Married    Spouse name: [name]  . Number of children: 1  . Years of education: some colle  . Highest education level: Not on file  Occupational History  . Occupation: On disability    Employer: UNEMPLOYED  Social Needs  . Financial resource strain: Not on file  . Food insecurity:    Worry: Not on file    Inability: Not on file  . Transportation needs:    Medical: Not on file    Non-medical: Not on file  Tobacco Use  . Smoking status: Former Smoker    Packs/day: 0.50    Types: Cigarettes    Last attempt to quit: 02/06/2016    Years since quitting: 1.6  . Smokeless tobacco: Never Used  . Tobacco comment: going to try e-sticks to quit  Substance and Sexual Activity  .  Alcohol use: No    Alcohol/week: 0.0 standard drinks  . Drug use: No  . Sexual activity: Not Currently    Partners: Male  Lifestyle  . Physical activity:    Days per week: Not on file    Minutes per session: Not on file  . Stress: Not on file  Relationships  . Social connections:    Talks on phone: Not on file    Gets together: Not on file    Attends religious service: Not on file    Active member of club or organization: Not on file    Attends meetings of clubs or organizations: Not on file    Relationship status: Not on file  . Intimate partner violence:    Fear of current or ex partner: Not on file    Emotionally abused: Not on file    Physically abused: Not on file    Forced sexual activity: Not on file  Other Topics Concern  . Not on file  Social History Narrative   8-10 cups of soda a day.  On disability. No regular exercise.      Family History  Problem Relation Age of Onset  . Depression Mother   . Hypertension Mother   . Hypertension Father   . Heart failure Father   . Diabetes Father   . Heart attack Father   . Colon cancer Neg Hx   . Esophageal cancer Neg Hx   . Stomach cancer Neg Hx   . Rectal cancer Neg Hx     Outpatient Encounter Medications as of 10/11/2017  Medication Sig  . citalopram (CELEXA) 40 MG tablet TAKE 1 TABLET(40 MG) BY MOUTH AT BEDTIME  . gabapentin (NEURONTIN) 800 MG tablet Take 1 tablet (800 mg total) by mouth 4 (four) times daily.  Marland Kitchen lamoTRIgine (LAMICTAL) 25 MG tablet Take 1 tablet (25 mg total) by mouth daily.  Marland Kitchen levothyroxine (SYNTHROID, LEVOTHROID) 25 MCG tablet TAKE 1 TABLET(25 MCG) BY MOUTH DAILY BEFORE BREAKFAST  . LINZESS 145 MCG CAPS capsule TAKE 1 CAPSULE BY MOUTH DAILY  . morphine (MSIR) 15 MG tablet Take 1 tablet (15 mg total) by mouth every 6 (six) hours as needed for severe pain.  Marland Kitchen Morphine-Naltrexone (EMBEDA) 100-4 MG CPCR Take 100 mg by mouth every 12 (twelve) hours.  . nortriptyline (PAMELOR) 50 MG capsule TAKE 2  CAPSULES(100 MG) BY MOUTH AT BEDTIME  . pravastatin (PRAVACHOL) 40 MG tablet TAKE 1 TABLET BY MOUTH DAILY  . promethazine (PHENERGAN) 12.5 MG tablet Take  1 tablet (12.5 mg total) by mouth every 12 (twelve) hours.  . rizatriptan (MAXALT) 10 MG tablet TAKE 1 TABLET BY MOUTH IF NEEDED. MAY REPEAT AFTER 2 HOURS IF NEEDED  . tiZANidine (ZANAFLEX) 2 MG tablet Take 1 tablet (2 mg total) by mouth 3 (three) times daily as needed. for muscle spams   No facility-administered encounter medications on file as of 10/11/2017.          Objective:   Physical Exam  Constitutional: She is oriented to person, place, and time. She appears well-developed and well-nourished.  HENT:  Head: Normocephalic and atraumatic.  Eyes: Conjunctivae and EOM are normal.  Cardiovascular: Normal rate.  Pulmonary/Chest: Effort normal.  Neurological: She is alert and oriented to person, place, and time.  Skin: Skin is dry. No pallor.  Two skin colored seborrheic keratosis on her left temple shows a very large proximally 2 cm thick seborrheic keratosis on the right side of her scalp just lateral to her temple. She has several larger darker brown and light brown rough textured waxy appearing seborrheic keratoses on her back.  Some ecchymoses around her right eye and across the nasal bridge and a little going into her left eye.  She has very large bruises over both knees her left knee is actually quite swollen and warm to touch but no erythema.  Psychiatric: She has a normal mood and affect. Her behavior is normal.  Vitals reviewed.      Assessment & Plan:  IFG  - check a1c today.  Will address results if needed.    Hyperlipidemia-due to recheck lipid panel.  Hypothyroidism - due for refills and needs TSH check.   Seborrheic keratoses-cryotherapy performed.  Patient tolerated well.  Cryotherapy Procedure Note  Pre-operative Diagnosis: Seborrheic keratoses  Post-operative Diagnosis: same  Locations: left temple, right  scalp and back   Indications: irritation  Anesthesia: not required    Procedure Details  Patient informed of risks (permanent scarring, infection, light or dark discoloration, bleeding, infection, weakness, numbness and recurrence of the lesion) and benefits of the procedure and verbal informed consent obtained.  The areas are treated with liquid nitrogen therapy, frozen until ice ball extended 1-2 mm beyond lesion, allowed to thaw, and treated again. The patient tolerated procedure well.  The patient was instructed on post-op care, warned that there may be blister formation, redness and pain. Recommend OTC analgesia as needed for pain.  Condition: Stable  Complications: none.  Plan: 1. Instructed to keep the area dry and covered for 24-48h and clean thereafter. 2. Warning signs of infection were reviewed.   3. Recommended that the patient use OTC acetaminophen as needed for pain.  4. Return PRN.

## 2017-10-11 NOTE — Telephone Encounter (Signed)
Pt advised that she will need to get the Tdap from the pharmacy and the Hep C is done thru labs. I asked her if she had her labs done and she did. Will add to  Her bloodwork. tdap faxed.[name], Lahoma Crocker, CMA

## 2017-10-11 NOTE — Telephone Encounter (Signed)
Pt has medicare, routing to PCP for order to be sent to pharmacy.

## 2017-10-11 NOTE — Telephone Encounter (Signed)
While checking out today,[name] was inquiring about getting a Hep-C shot and a TDAP shot. I was told that I needed to message the doctor.

## 2017-10-12 ENCOUNTER — Other Ambulatory Visit: Payer: Self-pay | Admitting: *Deleted

## 2017-10-12 DIAGNOSIS — R748 Abnormal levels of other serum enzymes: Secondary | ICD-10-CM

## 2017-10-12 MED ORDER — LEVOTHYROXINE SODIUM 25 MCG PO TABS: ORAL_TABLET | ORAL | 1 refills | Status: DC

## 2017-10-13 LAB — COMPLETE METABOLIC PANEL WITH GFR
AG RATIO: 1.9 (calc) (ref 1.0–2.5)
ALBUMIN MSPROF: 4.4 g/dL (ref 3.6–5.1)
ALKALINE PHOSPHATASE (APISO): 183 U/L — AB (ref 33–130)
ALT: 32 U/L — ABNORMAL HIGH (ref 6–29)
AST: 66 U/L — ABNORMAL HIGH (ref 10–35)
BILIRUBIN TOTAL: 1.3 mg/dL — AB (ref 0.2–1.2)
BUN: 9 mg/dL (ref 7–25)
CHLORIDE: 100 mmol/L (ref 98–110)
CO2: 24 mmol/L (ref 20–32)
Calcium: 9 mg/dL (ref 8.6–10.4)
Creat: 0.98 mg/dL (ref 0.50–1.05)
GFR, Est African American: 73 mL/min/{1.73_m2} (ref >=60)
GFR, Est Non African American: 63 mL/min/{1.73_m2} (ref >=60)
GLOBULIN: 2.3 g/dL (ref 1.9–3.7)
Glucose, Bld: 120 mg/dL — ABNORMAL HIGH (ref 65–99)
Potassium: 4.5 mmol/L (ref 3.5–5.3)
SODIUM: 137 mmol/L (ref 135–146)
Total Protein: 6.7 g/dL (ref 6.1–8.1)

## 2017-10-13 LAB — LIPID PANEL
CHOLESTEROL: 131 mg/dL (ref <200)
HDL: 47 mg/dL — ABNORMAL LOW (ref >50)
LDL Cholesterol (Calc): 60 mg/dL (calc)
Non-HDL Cholesterol (Calc): 84 mg/dL (calc) (ref <130)
Total CHOL/HDL Ratio: 2.8 (calc) (ref <5.0)
Triglycerides: 162 mg/dL — ABNORMAL HIGH (ref <150)

## 2017-10-13 LAB — HEMOGLOBIN A1C
HEMOGLOBIN A1C: 6.1 %{Hb} — AB (ref <5.7)
MEAN PLASMA GLUCOSE: 128 (calc)
eAG (mmol/L): 7.1 (calc)

## 2017-10-13 LAB — HEPATITIS C ANTIBODY
HEP C AB: NONREACTIVE
SIGNAL TO CUT-OFF: 0.02 (ref <1.00)

## 2017-10-13 LAB — TSH: TSH: 4.59 mIU/L — ABNORMAL HIGH (ref 0.40–4.50)

## 2017-10-22 ENCOUNTER — Encounter: Payer: Self-pay | Admitting: Registered Nurse

## 2017-10-22 ENCOUNTER — Encounter: Payer: Medicare Other | Attending: Physical Medicine and Rehabilitation | Admitting: Registered Nurse

## 2017-10-22 VITALS — BP 123/79 | HR 87 | Resp 14 | Ht 68.0 in | Wt 255.0 lb

## 2017-10-22 DIAGNOSIS — M7918 Myalgia, other site: Secondary | ICD-10-CM

## 2017-10-22 DIAGNOSIS — M47816 Spondylosis without myelopathy or radiculopathy, lumbar region: Secondary | ICD-10-CM | POA: Diagnosis not present

## 2017-10-22 DIAGNOSIS — Z79899 Other long term (current) drug therapy: Secondary | ICD-10-CM | POA: Diagnosis not present

## 2017-10-22 DIAGNOSIS — G8929 Other chronic pain: Secondary | ICD-10-CM

## 2017-10-22 DIAGNOSIS — Z760 Encounter for issue of repeat prescription: Secondary | ICD-10-CM | POA: Diagnosis not present

## 2017-10-22 DIAGNOSIS — M7061 Trochanteric bursitis, right hip: Secondary | ICD-10-CM

## 2017-10-22 DIAGNOSIS — G894 Chronic pain syndrome: Secondary | ICD-10-CM

## 2017-10-22 DIAGNOSIS — Z5181 Encounter for therapeutic drug level monitoring: Secondary | ICD-10-CM

## 2017-10-22 DIAGNOSIS — M1711 Unilateral primary osteoarthritis, right knee: Secondary | ICD-10-CM

## 2017-10-22 DIAGNOSIS — M1712 Unilateral primary osteoarthritis, left knee: Secondary | ICD-10-CM

## 2017-10-22 DIAGNOSIS — F329 Major depressive disorder, single episode, unspecified: Secondary | ICD-10-CM

## 2017-10-22 DIAGNOSIS — M7062 Trochanteric bursitis, left hip: Secondary | ICD-10-CM

## 2017-10-22 DIAGNOSIS — G43119 Migraine with aura, intractable, without status migrainosus: Secondary | ICD-10-CM

## 2017-10-22 DIAGNOSIS — M5136 Other intervertebral disc degeneration, lumbar region: Secondary | ICD-10-CM

## 2017-10-22 DIAGNOSIS — G9050 Complex regional pain syndrome I, unspecified: Secondary | ICD-10-CM

## 2017-10-22 DIAGNOSIS — M546 Pain in thoracic spine: Secondary | ICD-10-CM

## 2017-10-22 MED ORDER — MORPHINE-NALTREXONE 100-4 MG PO CPCR: 100.0000 mg | ORAL_CAPSULE | Freq: Two times a day (BID) | ORAL | 0 refills | Status: DC

## 2017-10-22 MED ORDER — MORPHINE SULFATE 15 MG PO TABS: 15.0000 mg | ORAL_TABLET | Freq: Four times a day (QID) | ORAL | 0 refills | Status: DC | PRN

## 2017-10-22 NOTE — Progress Notes (Signed)
Subjective:    Patient ID: [name], female    DOB: 1960-05-29, 57 y.o.   MRN: [id number]  HPI: [name] is a 57 year old female who returns for follow up appointment for chronic pain and medication refill. She states her pain is located in her left shoulder, mid- lower back, bilateral hips and bilateral knees. She rates her pain 7. Her current exercise regime is walking and performing stretching exercises with bands.   [name] reports she had a fall two weeks ago, she was walking up the stairs in her home, lost her balance and fell forward she repots. She hit the bridge of her nose against the chair and landed on her knees. Right eye with ecchymosis resolving and bilateral patella's with resolving ecchymosis. She was able to pick herself up, she didn't seek medical attention. She states she seen her PCP a week later.   [name] Equivalent is 260.00 MME. Last Oral Swab was Performed on 07/30/2017, see not for details.    Pain Inventory Average Pain 8 Pain Right Now 7 My pain is constant, sharp, burning, dull, stabbing, tingling and aching  In the last 24 hours, has pain interfered with the following? General activity 10 Relation with others 10 Enjoyment of life 10 What TIME of day is your pain at its worst? all Sleep (in general) Poor  Pain is worse with: walking, bending, sitting, standing and some activites Pain improves with: rest, heat/ice, medication, TENS and injections Relief from Meds: 7  Mobility walk with assistance use a cane use a walker ability to climb steps?  yes do you drive?  yes Do you have any goals in this area?  yes  Function disabled: date disabled . I need assistance with the following:  bathing, meal prep, household duties and shopping Do you have any goals in this area?  yes  Neuro/Psych weakness numbness tremor tingling trouble walking spasms dizziness confusion depression anxiety  Prior  Studies Any changes since last visit?  no  Physicians involved in your care Any changes since last visit?  no   Family History  Problem Relation Age of Onset  . Depression Mother   . Hypertension Mother   . Hypertension Father   . Heart failure Father   . Diabetes Father   . Heart attack Father   . Colon cancer Neg Hx   . Esophageal cancer Neg Hx   . Stomach cancer Neg Hx   . Rectal cancer Neg Hx    Social History   Socioeconomic History  . Marital status: Married    Spouse name: [name]  . Number of children: 1  . Years of education: some colle  . Highest education level: Not on file  Occupational History  . Occupation: On disability    Employer: UNEMPLOYED  Social Needs  . Financial resource strain: Not on file  . Food insecurity:    Worry: Not on file    Inability: Not on file  . Transportation needs:    Medical: Not on file    Non-medical: Not on file  Tobacco Use  . Smoking status: Former Smoker    Packs/day: 0.50    Types: Cigarettes    Last attempt to quit: 02/06/2016    Years since quitting: 1.7  . Smokeless tobacco: Never Used  . Tobacco comment: going to try e-sticks to quit  Substance and Sexual Activity  . Alcohol use: No    Alcohol/week: 0.0 standard drinks  .  Drug use: No  . Sexual activity: Not Currently    Partners: Male  Lifestyle  . Physical activity:    Days per week: Not on file    Minutes per session: Not on file  . Stress: Not on file  Relationships  . Social connections:    Talks on phone: Not on file    Gets together: Not on file    Attends religious service: Not on file    Active member of club or organization: Not on file    Attends meetings of clubs or organizations: Not on file    Relationship status: Not on file  Other Topics Concern  . Not on file  Social History Narrative   8-10 cups of soda a day.  On disability. No regular exercise.     Past Surgical History:  Procedure Laterality Date  . ABDOMINAL HYSTERECTOMY   May 2004  . Basel Cell Carcinoma  06/2005, 07/2005   nasal tip and reconstruction  . CARPAL TUNNEL RELEASE  07/1993   both hands  . COLONOSCOPY    . HEMORROIDECTOMY  July 2003  . KNEE ARTHROPLASTY  09/2001, 05/2003   left knee, chondromalasia  . KNEE ARTHROSCOPY  jan 2005   Right knee, tisse release, chrondromalacia  . REPLACEMENT TOTAL KNEE  Nov 2005   Left   . THYROID LOBECTOMY  01/2005   malignant area removed from right lobe  . THYROID SURGERY     thyroid lobe removal  . TONSILLECTOMY  09/1972  . TUBAL LIGATION  Sept 1999  . Ulnar nerve entrapment  Feb 1995   left elbow   Past Medical History:  Diagnosis Date  . Allergy   . Anxiety   . Cancer ([place])    cancerous nodules on thyroid  . Cancer ([place])    basal cell Cancer-nose  . Chronic pain syndrome   . COPD (chronic obstructive pulmonary disease) ([place])   . Degeneration of lumbar or lumbosacral intervertebral disc   . Depression   . Facet syndrome, lumbar   . GERD (gastroesophageal reflux disease)   . Hyperlipidemia   . Intervertebral lumbar disc disorder with myelopathy, lumbar region   . Lumbosacral spondylosis without myelopathy   . Migraine without aura, with intractable migraine, so stated, without mention of status migrainosus   . Primary localized osteoarthrosis, lower leg   . Thyroid disease    hypothyroid  . Unspecified musculoskeletal disorders and symptoms referable to neck    cervical/trapezius   BP 123/79   Pulse 87   Resp 14   Ht 5\' 8"  (1.727 m)   Wt 255 lb (115.7 kg)   SpO2 94%   BMI 38.77 kg/m   Opioid Risk Score:   Fall Risk Score:  `1  Depression screen PHQ 2/9  Depression screen [facility] 2/9 10/11/2017 10/25/2016 08/25/2016 07/14/2016 11/10/2015 09/06/2015 03/16/2015  Decreased Interest 3 3 3 3 3 3 3   Down, Depressed, Hopeless 3 3 3 3 3  - 3  PHQ - 2 Score 6 6 6 6 6 3 6   Altered sleeping 3 - - - - - -  Tired, decreased energy 3 - - - - - -  Change in appetite 2 - - - - - -  Feeling bad or failure about  yourself  3 - - - - - -  Trouble concentrating 3 - - - - - -  Moving slowly or fidgety/restless 0 - - - - - -  Suicidal thoughts 0 - - - - - -  PHQ-9 Score 20 - - - - - -  Difficult doing work/chores Extremely dIfficult - - - - - -  Some recent data might be hidden    Review of Systems  Constitutional: Positive for appetite change, diaphoresis and unexpected weight change.  HENT: Negative.   Eyes: Negative.   Respiratory: Negative.   Cardiovascular: Positive for leg swelling.  Gastrointestinal: Positive for abdominal pain, nausea and vomiting.  Genitourinary: Negative.   Musculoskeletal: Positive for arthralgias, back pain, gait problem, neck pain and neck stiffness.  Allergic/Immunologic: Negative.   Neurological: Positive for dizziness, tremors, weakness and numbness.       Tingling   Psychiatric/Behavioral: Positive for confusion and dysphoric mood. The patient is nervous/anxious.        Objective:   Physical Exam  Constitutional: She is oriented to person, place, and time. She appears well-developed and well-nourished.  HENT:  Head: Normocephalic and atraumatic.  Neck: Normal range of motion. Neck supple.  Cardiovascular: Normal rate and regular rhythm.  Pulmonary/Chest: Effort normal and breath sounds normal.  Musculoskeletal:  Normal Muscle Bulk and Muscle Testing Reveals: Upper Extremities: Full ROM and Muscle Strength 5/5 Left AC Joint Tenderness Thoracic Paras(inal Tenderness: T-7-T-9 Mainly Left Side Lumbar Paraspinal Tenderness: L-3-L-5 Bilateral Greater Trochanter Tenderness Lower Extremities: Decreased ROM and Muscle Strength 4/5  Bilateral Lower Extremities Flexion Produces Pain into Bilateral Patella's Arises from Table Slowly using cane for support Antalgic Gait   Neurological: She is alert and oriented to person, place, and time.  Skin: Skin is warm and dry.  Psychiatric: She has a normal mood and affect. Her behavior is normal.  Nursing note and  vitals reviewed.         Assessment & Plan:  1.Chronic Bilateral Thoracic Back Pain/ Low Back Pain/ Lumbar Facet Arthropathy:RefilledOxycodone 15mg  one tablet every 6 hoursas needed #120and Embeda 100 mg every 12 hours #60.Continue Gabapentin and Pamelor. 10/22/2017 We will continue the opioid monitoring program, this consists of regular clinic visits, examinations, urine drug screen, pill counts as well as use of [place] Controlled Substance Reporting System. 2. Degenerative Disk Disease:Encouraged Continue HEP as Tolertatedand heat therapy. Continue Current Medication Regime.10/22/2017 3. Bilateral Greater Trochanteric Bursitis: Continuecurrent treatment regimenwith Heat and Ice Therapy.10/22/2017 4. Osteoarthritis of Bilateral Knees: Continue current treatment modality with homeexerciseprogramand heat therapy. 10/22/2017 5. Migraine Headaches:Instructed to keep Migraine Journal. Continue LamictalandMaxalt. Continue to Monitor. 10/22/2017 6. Smoking Cessation: She has quit smokingsince 08/25/2016. Continue to Monitor. 10/22/2017 7.Constipation: ContinueCurrent medication regime withLinzess.10/22/2017 8. Muscle Spasm: Continuecurrent medication regime withTizanidine.10/22/2017 9. DepressionAnxiety/ Panic Attacks: Continuecurrent medication regimen and treatment modality.Celexaand Counseling with [place].PsychologistFollowing. 10/22/2017 10. RSD: Continue with current medication Regime with Gabapentin.10/22/2017  30 minutes of face to face patient care time was spent during this visit. All questions were encouraged and answered.   F/U in84month

## 2017-11-01 ENCOUNTER — Ambulatory Visit (INDEPENDENT_AMBULATORY_CARE_PROVIDER_SITE_OTHER): Payer: Medicare Other | Admitting: Family Medicine

## 2017-11-01 ENCOUNTER — Encounter: Payer: Self-pay | Admitting: Family Medicine

## 2017-11-01 VITALS — BP 128/71 | HR 82 | Ht 68.0 in | Wt 258.0 lb

## 2017-11-01 DIAGNOSIS — T17308A Unspecified foreign body in larynx causing other injury, initial encounter: Secondary | ICD-10-CM | POA: Diagnosis not present

## 2017-11-01 DIAGNOSIS — R748 Abnormal levels of other serum enzymes: Secondary | ICD-10-CM | POA: Diagnosis not present

## 2017-11-01 DIAGNOSIS — R1314 Dysphagia, pharyngoesophageal phase: Secondary | ICD-10-CM

## 2017-11-01 DIAGNOSIS — F339 Major depressive disorder, recurrent, unspecified: Secondary | ICD-10-CM

## 2017-11-01 DIAGNOSIS — Z85850 Personal history of malignant neoplasm of thyroid: Secondary | ICD-10-CM | POA: Diagnosis not present

## 2017-11-01 DIAGNOSIS — E041 Nontoxic single thyroid nodule: Secondary | ICD-10-CM

## 2017-11-01 LAB — HEPATIC FUNCTION PANEL
AG Ratio: 2.1 (calc) (ref 1.0–2.5)
ALT: 39 U/L — ABNORMAL HIGH (ref 6–29)
AST: 100 U/L — ABNORMAL HIGH (ref 10–35)
Albumin: 4.1 g/dL (ref 3.6–5.1)
Alkaline phosphatase (APISO): 221 U/L — ABNORMAL HIGH (ref 33–130)
Bilirubin, Direct: 0.3 mg/dL — ABNORMAL HIGH (ref 0.0–0.2)
Globulin: 2 g/dL (ref 1.9–3.7)
Indirect Bilirubin: 0.6 mg/dL (ref 0.2–1.2)
Total Bilirubin: 0.9 mg/dL (ref 0.2–1.2)
Total Protein: 6.1 g/dL (ref 6.1–8.1)

## 2017-11-01 NOTE — Progress Notes (Signed)
Subjective:    Patient ID: [name], female    DOB: 1960-11-13, 57 y.o.   MRN: [id number]  HPI 57 yo female c/o of problem swallowing rice and noodles and now in the last month she is having a hard time with onions and ground beef and popcorn.  She admits she cannot to very well because she really only has top dentures that works her mom to ensure she rarely wears because they rub and are painful.  She is noticing that food will get stuck in the upper mid chest area.  And then she will just try to drink a lot of water it caused her to cough and then a lot of times she will vomit.  Sometimes she actually aspirates and feels like it goes down her windpipe and then will choke.  She says now is happening at least 2-3 times per week..     She is also requesting referral for a new therapist.  She likes who she is seeing now but if she is getting more more difficult to travel back and forth to [place] and she would like something more local.  Judie Bonus, Janetta Hora or [facility].    History of thyroid cancer-she would like to get an update thyroid scan.  The last one was in 2013 we had ordered another one in 2015 but she never went.  Review of Systems  BP 128/71   Pulse 82   Ht 5\' 8"  (1.727 m)   Wt 258 lb (117 kg)   SpO2 97%   BMI 39.23 kg/m     Allergies  Allergen Reactions  . Aspirin Other (See Comments)    Abdominal bleeding   . Ibuprofen Swelling  . Imitrex [Sumatriptan Base] Other (See Comments)    Drops BP   . Norvasc [Amlodipine Besylate] Swelling  . Nsaids Swelling  . Tape     Skin blisters under surgical tape  . Topamax [Topiramate] Other (See Comments)    Hair loss    Past Medical History:  Diagnosis Date  . Allergy   . Anxiety   . Cancer ([place])    cancerous nodules on thyroid  . Cancer ([place])    basal cell Cancer-nose  . Chronic pain syndrome   . COPD (chronic obstructive pulmonary disease) ([place])   . Degeneration of lumbar or lumbosacral intervertebral disc    . Depression   . Facet syndrome, lumbar   . GERD (gastroesophageal reflux disease)   . Hyperlipidemia   . Intervertebral lumbar disc disorder with myelopathy, lumbar region   . Lumbosacral spondylosis without myelopathy   . Migraine without aura, with intractable migraine, so stated, without mention of status migrainosus   . Primary localized osteoarthrosis, lower leg   . Thyroid disease    hypothyroid  . Unspecified musculoskeletal disorders and symptoms referable to neck    cervical/trapezius    Past Surgical History:  Procedure Laterality Date  . ABDOMINAL HYSTERECTOMY  May 2004  . Basel Cell Carcinoma  06/2005, 07/2005   nasal tip and reconstruction  . CARPAL TUNNEL RELEASE  07/1993   both hands  . COLONOSCOPY    . HEMORROIDECTOMY  July 2003  . KNEE ARTHROPLASTY  09/2001, 05/2003   left knee, chondromalasia  . KNEE ARTHROSCOPY  jan 2005   Right knee, tisse release, chrondromalacia  . REPLACEMENT TOTAL KNEE  Nov 2005   Left   . THYROID LOBECTOMY  01/2005   malignant area removed from right lobe  . THYROID SURGERY  thyroid lobe removal  . TONSILLECTOMY  09/1972  . TUBAL LIGATION  Sept 1999  . Ulnar nerve entrapment  Feb 1995   left elbow    Social History   Socioeconomic History  . Marital status: Married    Spouse name: [name]  . Number of children: 1  . Years of education: some college  . Highest education level: Not on file  Occupational History  . Occupation: On disability    Employer: UNEMPLOYED  Social Needs  . Financial resource strain: Not on file  . Food insecurity:    Worry: Not on file    Inability: Not on file  . Transportation needs:    Medical: Not on file    Non-medical: Not on file  Tobacco Use  . Smoking status: Former Smoker    Packs/day: 0.50    Types: Cigarettes    Last attempt to quit: 02/06/2016    Years since quitting: 1.7  . Smokeless tobacco: Never Used  . Tobacco comment: going to try e-sticks to quit  Substance and Sexual  Activity  . Alcohol use: No    Alcohol/week: 0.0 standard drinks  . Drug use: No  . Sexual activity: Not Currently    Partners: Male  Lifestyle  . Physical activity:    Days per week: Not on file    Minutes per session: Not on file  . Stress: Not on file  Relationships  . Social connections:    Talks on phone: Not on file    Gets together: Not on file    Attends religious service: Not on file    Active member of club or organization: Not on file    Attends meetings of clubs or organizations: Not on file    Relationship status: Not on file  . Intimate partner violence:    Fear of current or ex partner: Not on file    Emotionally abused: Not on file    Physically abused: Not on file    Forced sexual activity: Not on file  Other Topics Concern  . Not on file  Social History Narrative   8-10 cups of soda a day.  On disability. No regular exercise.      Family History  Problem Relation Age of Onset  . Depression Mother   . Hypertension Mother   . Hypertension Father   . Heart failure Father   . Diabetes Father   . Heart attack Father   . Colon cancer Neg Hx   . Esophageal cancer Neg Hx   . Stomach cancer Neg Hx   . Rectal cancer Neg Hx     Outpatient Encounter Medications as of 11/01/2017  Medication Sig  . citalopram (CELEXA) 40 MG tablet TAKE 1 TABLET(40 MG) BY MOUTH AT BEDTIME  . gabapentin (NEURONTIN) 800 MG tablet Take 1 tablet (800 mg total) by mouth 4 (four) times daily.  Marland Kitchen lamoTRIgine (LAMICTAL) 25 MG tablet Take 1 tablet (25 mg total) by mouth daily.  Marland Kitchen levothyroxine (SYNTHROID, LEVOTHROID) 25 MCG tablet TAKE 1 TABLET(25 MCG) BY MOUTH DAILY BEFORE BREAKFAST  . LINZESS 145 MCG CAPS capsule TAKE 1 CAPSULE BY MOUTH DAILY  . morphine (MSIR) 15 MG tablet Take 1 tablet (15 mg total) by mouth every 6 (six) hours as needed for severe pain.  Marland Kitchen Morphine-Naltrexone (EMBEDA) 100-4 MG CPCR Take 100 mg by mouth every 12 (twelve) hours.  . nortriptyline (PAMELOR) 50 MG  capsule TAKE 2 CAPSULES(100 MG) BY MOUTH AT BEDTIME  . pravastatin (PRAVACHOL)  40 MG tablet TAKE 1 TABLET BY MOUTH DAILY  . promethazine (PHENERGAN) 12.5 MG tablet Take 1 tablet (12.5 mg total) by mouth every 12 (twelve) hours.  . rizatriptan (MAXALT) 10 MG tablet TAKE 1 TABLET BY MOUTH IF NEEDED. MAY REPEAT AFTER 2 HOURS IF NEEDED  . tiZANidine (ZANAFLEX) 2 MG tablet Take 1 tablet (2 mg total) by mouth 3 (three) times daily as needed. for muscle spams  . [DISCONTINUED] AMBULATORY NON FORMULARY MEDICATION Medication Name: Tdap 1x IM   No facility-administered encounter medications on file as of 11/01/2017.          Objective:   Physical Exam  Constitutional: She is oriented to person, place, and time. She appears well-developed and well-nourished.  HENT:  Head: Normocephalic and atraumatic.  Right Ear: External ear normal.  Left Ear: External ear normal.  Nose: Nose normal.  Eyes: Conjunctivae are normal.  Cardiovascular: Normal rate, regular rhythm and normal heart sounds.  Pulmonary/Chest: Effort normal and breath sounds normal.  Neurological: She is alert and oriented to person, place, and time.  Skin: Skin is warm and dry.  Psychiatric: She has a normal mood and affect. Her behavior is normal.          Assessment & Plan:  Dysphagia-we will refer to gastroenterology for further work-up.  Likely esophageal stricture.  Just encouraged her in the short-term to chew her food really well and he take small bites.  It also sounds like she is occasionally aspirating which may be a completely separate issue.  History of thyroid cancer/thyroid nodules-plan to recheck ultrasound.  Last one was about 6 years ago.  Depression-we will refer for a new therapist.

## 2017-11-01 NOTE — Patient Instructions (Signed)
The place referral to gastroenterology-they should be calling you in the next couple of days to get you scheduled.

## 2017-11-02 ENCOUNTER — Other Ambulatory Visit: Payer: Self-pay | Admitting: Family Medicine

## 2017-11-02 DIAGNOSIS — R945 Abnormal results of liver function studies: Principal | ICD-10-CM

## 2017-11-02 DIAGNOSIS — R7989 Other specified abnormal findings of blood chemistry: Secondary | ICD-10-CM

## 2017-11-02 NOTE — Addendum Note (Signed)
Addended by: Teddy Spike on: 11/02/2017 10:59 AM   Modules accepted: Orders

## 2017-11-07 ENCOUNTER — Ambulatory Visit (INDEPENDENT_AMBULATORY_CARE_PROVIDER_SITE_OTHER): Payer: Medicare Other

## 2017-11-07 ENCOUNTER — Telehealth: Payer: Self-pay | Admitting: *Deleted

## 2017-11-07 DIAGNOSIS — E042 Nontoxic multinodular goiter: Secondary | ICD-10-CM

## 2017-11-07 DIAGNOSIS — E041 Nontoxic single thyroid nodule: Secondary | ICD-10-CM

## 2017-11-07 DIAGNOSIS — T17308A Unspecified foreign body in larynx causing other injury, initial encounter: Secondary | ICD-10-CM

## 2017-11-07 DIAGNOSIS — Z85850 Personal history of malignant neoplasm of thyroid: Secondary | ICD-10-CM

## 2017-11-07 DIAGNOSIS — Z1231 Encounter for screening mammogram for malignant neoplasm of breast: Secondary | ICD-10-CM

## 2017-11-07 DIAGNOSIS — R1314 Dysphagia, pharyngoesophageal phase: Secondary | ICD-10-CM

## 2017-11-07 NOTE — Telephone Encounter (Signed)
[name],  This patient was calling about her referral for behavioral health. She hasn't heard anything. Can you check on this for her and call her back please and thank you.[name], [place]

## 2017-11-08 ENCOUNTER — Ambulatory Visit (HOSPITAL_COMMUNITY)
Admission: RE | Admit: 2017-11-08 | Discharge: 2017-11-08 | Disposition: A | Discharge status: Home / Self Care | Payer: Medicare Other | Source: Ambulatory Visit | Attending: Family Medicine | Admitting: Family Medicine

## 2017-11-08 DIAGNOSIS — R945 Abnormal results of liver function studies: Secondary | ICD-10-CM | POA: Insufficient documentation

## 2017-11-08 DIAGNOSIS — K838 Other specified diseases of biliary tract: Secondary | ICD-10-CM | POA: Diagnosis not present

## 2017-11-08 DIAGNOSIS — R7989 Other specified abnormal findings of blood chemistry: Secondary | ICD-10-CM

## 2017-11-09 ENCOUNTER — Other Ambulatory Visit: Payer: Self-pay | Admitting: Physical Medicine & Rehabilitation

## 2017-11-09 DIAGNOSIS — G8929 Other chronic pain: Secondary | ICD-10-CM | POA: Diagnosis not present

## 2017-11-09 DIAGNOSIS — Z87891 Personal history of nicotine dependence: Secondary | ICD-10-CM | POA: Diagnosis not present

## 2017-11-09 DIAGNOSIS — R1310 Dysphagia, unspecified: Secondary | ICD-10-CM | POA: Diagnosis not present

## 2017-11-09 NOTE — Telephone Encounter (Signed)
I called [facility] and they are reviewing the referral and will call patient to schedule today or Monday - CF

## 2017-11-19 ENCOUNTER — Encounter: Payer: Self-pay | Admitting: Physical Medicine & Rehabilitation

## 2017-11-19 ENCOUNTER — Encounter: Payer: Medicare Other | Attending: Physical Medicine and Rehabilitation | Admitting: Physical Medicine & Rehabilitation

## 2017-11-19 VITALS — BP 122/82 | HR 83 | Ht 68.0 in | Wt 264.0 lb

## 2017-11-19 DIAGNOSIS — Z5181 Encounter for therapeutic drug level monitoring: Secondary | ICD-10-CM | POA: Diagnosis not present

## 2017-11-19 DIAGNOSIS — G894 Chronic pain syndrome: Secondary | ICD-10-CM | POA: Diagnosis not present

## 2017-11-19 DIAGNOSIS — Z79899 Other long term (current) drug therapy: Secondary | ICD-10-CM

## 2017-11-19 DIAGNOSIS — Z79891 Long term (current) use of opiate analgesic: Secondary | ICD-10-CM | POA: Diagnosis not present

## 2017-11-19 DIAGNOSIS — G43119 Migraine with aura, intractable, without status migrainosus: Secondary | ICD-10-CM

## 2017-11-19 DIAGNOSIS — Z760 Encounter for issue of repeat prescription: Secondary | ICD-10-CM | POA: Diagnosis not present

## 2017-11-19 DIAGNOSIS — M1712 Unilateral primary osteoarthritis, left knee: Secondary | ICD-10-CM

## 2017-11-19 DIAGNOSIS — M1711 Unilateral primary osteoarthritis, right knee: Secondary | ICD-10-CM

## 2017-11-19 MED ORDER — MORPHINE SULFATE 15 MG PO TABS: 15.0000 mg | ORAL_TABLET | Freq: Four times a day (QID) | ORAL | 0 refills | Status: DC | PRN

## 2017-11-19 MED ORDER — MORPHINE-NALTREXONE 100-4 MG PO CPCR: 100.0000 mg | ORAL_CAPSULE | Freq: Two times a day (BID) | ORAL | 0 refills | Status: DC

## 2017-11-19 NOTE — Patient Instructions (Signed)
PLEASE FEEL FREE TO CALL OUR OFFICE WITH ANY PROBLEMS OR QUESTIONS ([phone])      

## 2017-11-19 NOTE — Progress Notes (Signed)
Subjective:    Patient ID: [name], female    DOB: [birth year], 57 y.o.   MRN: [id number]  HPI   [name] is here in follow up of her chronic pain.  She states that things have gone fairly well as of late.  She did have a fall around Labor Day while going up 2 steps and landed on her nose suffering some bruising and contusions.  She has not had any issues since then.  Back earlier this year we had decreased her breakthrough morphine to 4 times daily.  After some difficult beginnings she has been handling this better as of late.  Currently she is on Embeda 100 mg twice daily as well as immediate release morphine 15 mg every 6 hours as needed.  She has not been able to reduce the breakthrough medication any further.  She does try do some stretching and ambulates when she can.  She has a harder time with a changes in weather in the cool temperatures that we have had recently.  Pain Inventory Average Pain 8 Pain Right Now 7 My pain is constant, sharp, burning, dull, stabbing, tingling and aching  In the last 24 hours, has pain interfered with the following? General activity 10 Relation with others 10 Enjoyment of life 10 What TIME of day is your pain at its worst? . Sleep (in general) Poor  Pain is worse with: walking, bending, standing and some activites Pain improves with: rest, heat/ice, therapy/exercise, medication, TENS and injections Relief from Meds: 6  Mobility use a cane use a walker ability to climb steps?  yes do you drive?  yes  Function I need assistance with the following:  bathing, meal prep, household duties and shopping  Neuro/Psych weakness numbness tremor tingling trouble walking spasms dizziness confusion depression anxiety  Prior Studies Any changes since last visit?  no  Physicians involved in your care Any changes since last visit?  no   Family History  Problem Relation Age of Onset  . Depression Mother   . Hypertension Mother   .  Hypertension Father   . Heart failure Father   . Diabetes Father   . Heart attack Father   . Colon cancer Neg Hx   . Esophageal cancer Neg Hx   . Stomach cancer Neg Hx   . Rectal cancer Neg Hx    Social History   Socioeconomic History  . Marital status: Married    Spouse name: [name]  . Number of children: 1  . Years of education: some college  . Highest education level: Not on file  Occupational History  . Occupation: On disability    Employer: UNEMPLOYED  Social Needs  . Financial resource strain: Not on file  . Food insecurity:    Worry: Not on file    Inability: Not on file  . Transportation needs:    Medical: Not on file    Non-medical: Not on file  Tobacco Use  . Smoking status: Former Smoker    Packs/day: 0.50    Types: Cigarettes    Last attempt to quit: 02/06/2016    Years since quitting: 1.7  . Smokeless tobacco: Never Used  . Tobacco comment: going to try e-sticks to quit  Substance and Sexual Activity  . Alcohol use: No    Alcohol/week: 0.0 standard drinks  . Drug use: No  . Sexual activity: Not Currently    Partners: Male  Lifestyle  . Physical activity:    Days per week: Not  on file    Minutes per session: Not on file  . Stress: Not on file  Relationships  . Social connections:    Talks on phone: Not on file    Gets together: Not on file    Attends religious service: Not on file    Active member of club or organization: Not on file    Attends meetings of clubs or organizations: Not on file    Relationship status: Not on file  Other Topics Concern  . Not on file  Social History Narrative   8-10 cups of soda a day.  On disability. No regular exercise.     Past Surgical History:  Procedure Laterality Date  . ABDOMINAL HYSTERECTOMY  May 2004  . Basel Cell Carcinoma  06/2005, 07/2005   nasal tip and reconstruction  . BREAST CYST ASPIRATION    . CARPAL TUNNEL RELEASE  07/1993   both hands  . COLONOSCOPY    . HEMORROIDECTOMY  July 2003  .  KNEE ARTHROPLASTY  09/2001, 05/2003   left knee, chondromalasia  . KNEE ARTHROSCOPY  jan 2005   Right knee, tisse release, chrondromalacia  . REPLACEMENT TOTAL KNEE  Nov 2005   Left   . THYROID LOBECTOMY  01/2005   malignant area removed from right lobe  . THYROID SURGERY     thyroid lobe removal  . TONSILLECTOMY  09/1972  . TUBAL LIGATION  Sept 1999  . Ulnar nerve entrapment  Feb 1995   left elbow   Past Medical History:  Diagnosis Date  . Allergy   . Anxiety   . Cancer ([place])    cancerous nodules on thyroid  . Cancer ([place])    basal cell Cancer-nose  . Chronic pain syndrome   . COPD (chronic obstructive pulmonary disease) ([place])   . Degeneration of lumbar or lumbosacral intervertebral disc   . Depression   . Facet syndrome, lumbar   . GERD (gastroesophageal reflux disease)   . Hyperlipidemia   . Intervertebral lumbar disc disorder with myelopathy, lumbar region   . Lumbosacral spondylosis without myelopathy   . Migraine without aura, with intractable migraine, so stated, without mention of status migrainosus   . Primary localized osteoarthrosis, lower leg   . Thyroid disease    hypothyroid  . Unspecified musculoskeletal disorders and symptoms referable to neck    cervical/trapezius   There were no vitals taken for this visit.  Opioid Risk Score:   Fall Risk Score:  `1  Depression screen PHQ 2/9  Depression screen [facility] 2/9 10/11/2017 10/25/2016 08/25/2016 07/14/2016 11/10/2015 09/06/2015 03/16/2015  Decreased Interest 3 3 3 3 3 3 3   Down, Depressed, Hopeless 3 3 3 3 3  - 3  PHQ - 2 Score 6 6 6 6 6 3 6   Altered sleeping 3 - - - - - -  Tired, decreased energy 3 - - - - - -  Change in appetite 2 - - - - - -  Feeling bad or failure about yourself  3 - - - - - -  Trouble concentrating 3 - - - - - -  Moving slowly or fidgety/restless 0 - - - - - -  Suicidal thoughts 0 - - - - - -  PHQ-9 Score 20 - - - - - -  Difficult doing work/chores Extremely dIfficult - - - - - -  Some recent  data might be hidden     Review of Systems  Constitutional: Positive for appetite change, diaphoresis and unexpected  weight change.  HENT: Negative.   Eyes: Negative.   Respiratory: Negative.   Cardiovascular: Negative.   Gastrointestinal: Positive for nausea and vomiting.  Endocrine: Negative.   Genitourinary: Negative.   Musculoskeletal: Positive for arthralgias, back pain, gait problem, joint swelling and myalgias.  Skin: Negative.   Allergic/Immunologic: Negative.   Neurological: Positive for dizziness, weakness and numbness.  Hematological: Negative.   Psychiatric/Behavioral: Positive for confusion and dysphoric mood. The patient is nervous/anxious.   All other systems reviewed and are negative.      Objective:   Physical Exam General: No acute distress HEENT: EOMI, oral membranes moist, bridge of nose with small scar Cards: reg rate  Chest: normal effort Abdomen: Soft, NT, ND Skin: dry, intact Extremities: no edema Musculoskeletal: She has antalgia of bilateral lower extremities with gait.  She favors the right more so than the left.  Bilateral lower extremity edema present at trace to 1+.  Uses a cane to offload her extremities. . Neurological: normal exam Psychiatry: pleasant         1.Chronic Low Back Pain:          -continue embeda 100mg  q12, #60          -  oxycodone to ms IR 15mg  q6 prn #120  -We discussed the idea of trying to reduce her breakthrough medication a bit more over the last couple months.  Even if it is cutting 1 of her immediate release morphine in half to around 7.5 mg a few days each month. -We will continue the controlled substance monitoring program, this consists of regular clinic visits, examinations, routine drug screening, pill counts as well as use of [place] Controlled Substance Reporting System. [place] today.            -Urine drug testing today.  -safety discussed 2. Degenerative Disk  Disease:Continue exercise, and heat therapy.  3. Osteoarthritis of Bilateral Knees: Continue exercise and HEP -Continue compression stockings and edema control which have been discussed. 4. Migraine Headaches: On Maxalt.  5. Smoking Cessation:3 months plus cessation! 6. Constipation: Continue Linzess 7. Muscle Spasm: Continue Tizanidine 8. Chronic depression: celexa. Continue counseling per [place] as above.  - He is support provided.15 minutesof face to face patient care time was spent during this visit. All questions were encouraged and answered.  Follow-up with nurse practitioner in about a month   \

## 2017-11-22 ENCOUNTER — Other Ambulatory Visit: Payer: Self-pay | Admitting: Registered Nurse

## 2017-11-24 LAB — 6-ACETYLMORPHINE,TOXASSURE ADD
6-ACETYLMORPHINE: NEGATIVE
6-ACETYLMORPHINE: NOT DETECTED ng/mg{creat}

## 2017-11-24 LAB — TOXASSURE SELECT,+ANTIDEPR,UR

## 2017-11-28 ENCOUNTER — Telehealth: Payer: Self-pay | Admitting: *Deleted

## 2017-11-28 NOTE — Telephone Encounter (Signed)
Urine drug screen for this encounter is consistent for prescribed medication 

## 2017-11-29 ENCOUNTER — Ambulatory Visit: Payer: Medicare Other | Admitting: Psychology

## 2017-12-03 ENCOUNTER — Other Ambulatory Visit: Payer: Self-pay | Admitting: Registered Nurse

## 2017-12-05 ENCOUNTER — Other Ambulatory Visit: Payer: Self-pay | Admitting: Physical Medicine & Rehabilitation

## 2017-12-09 ENCOUNTER — Other Ambulatory Visit: Payer: Self-pay | Admitting: Family Medicine

## 2017-12-14 ENCOUNTER — Ambulatory Visit (INDEPENDENT_AMBULATORY_CARE_PROVIDER_SITE_OTHER): Payer: Medicare Other | Admitting: Psychiatry

## 2017-12-14 ENCOUNTER — Encounter (HOSPITAL_COMMUNITY): Payer: Self-pay | Admitting: Psychiatry

## 2017-12-14 VITALS — BP 140/90 | HR 90 | Ht 68.0 in | Wt 253.0 lb

## 2017-12-14 DIAGNOSIS — G894 Chronic pain syndrome: Secondary | ICD-10-CM | POA: Diagnosis not present

## 2017-12-14 DIAGNOSIS — M5136 Other intervertebral disc degeneration, lumbar region: Secondary | ICD-10-CM | POA: Diagnosis not present

## 2017-12-14 DIAGNOSIS — F331 Major depressive disorder, recurrent, moderate: Secondary | ICD-10-CM

## 2017-12-14 DIAGNOSIS — F411 Generalized anxiety disorder: Secondary | ICD-10-CM

## 2017-12-14 NOTE — Patient Instructions (Addendum)
Refer to therapy for anxiety, panic attacks and coping skills  Discussed to do breathing techniques and go in open air or less crowded if having panic  Carry a water bottle  Can consider adding buspar 7.5mg  qd if needed for anxiety ( says need to discuss with pain clinic)   On celexa and gabapentin should help anxiety and depression , can continue
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
## 2017-12-19 ENCOUNTER — Encounter: Payer: Self-pay | Admitting: Registered Nurse

## 2017-12-19 ENCOUNTER — Encounter: Payer: Medicare Other | Attending: Physical Medicine and Rehabilitation | Admitting: Registered Nurse

## 2017-12-19 VITALS — BP 116/79 | HR 78 | Ht 68.0 in | Wt 253.4 lb

## 2017-12-19 DIAGNOSIS — M5136 Other intervertebral disc degeneration, lumbar region: Secondary | ICD-10-CM

## 2017-12-19 DIAGNOSIS — M7062 Trochanteric bursitis, left hip: Secondary | ICD-10-CM

## 2017-12-19 DIAGNOSIS — Z760 Encounter for issue of repeat prescription: Secondary | ICD-10-CM | POA: Diagnosis not present

## 2017-12-19 DIAGNOSIS — G9050 Complex regional pain syndrome I, unspecified: Secondary | ICD-10-CM

## 2017-12-19 DIAGNOSIS — G43119 Migraine with aura, intractable, without status migrainosus: Secondary | ICD-10-CM

## 2017-12-19 DIAGNOSIS — F329 Major depressive disorder, single episode, unspecified: Secondary | ICD-10-CM

## 2017-12-19 DIAGNOSIS — M51369 Other intervertebral disc degeneration, lumbar region without mention of lumbar back pain or lower extremity pain: Secondary | ICD-10-CM

## 2017-12-19 DIAGNOSIS — M1712 Unilateral primary osteoarthritis, left knee: Secondary | ICD-10-CM

## 2017-12-19 DIAGNOSIS — Z5181 Encounter for therapeutic drug level monitoring: Secondary | ICD-10-CM

## 2017-12-19 DIAGNOSIS — M7918 Myalgia, other site: Secondary | ICD-10-CM

## 2017-12-19 DIAGNOSIS — M7061 Trochanteric bursitis, right hip: Secondary | ICD-10-CM

## 2017-12-19 DIAGNOSIS — M47816 Spondylosis without myelopathy or radiculopathy, lumbar region: Secondary | ICD-10-CM | POA: Diagnosis not present

## 2017-12-19 DIAGNOSIS — M1711 Unilateral primary osteoarthritis, right knee: Secondary | ICD-10-CM

## 2017-12-19 DIAGNOSIS — Z79891 Long term (current) use of opiate analgesic: Secondary | ICD-10-CM

## 2017-12-19 DIAGNOSIS — G8929 Other chronic pain: Secondary | ICD-10-CM

## 2017-12-19 DIAGNOSIS — G894 Chronic pain syndrome: Secondary | ICD-10-CM

## 2017-12-19 DIAGNOSIS — M546 Pain in thoracic spine: Secondary | ICD-10-CM

## 2017-12-19 DIAGNOSIS — Z79899 Other long term (current) drug therapy: Secondary | ICD-10-CM

## 2017-12-19 MED ORDER — MORPHINE SULFATE 15 MG PO TABS: 15.0000 mg | ORAL_TABLET | Freq: Four times a day (QID) | ORAL | 0 refills | Status: DC | PRN

## 2017-12-19 MED ORDER — MORPHINE-NALTREXONE 100-4 MG PO CPCR: 100.0000 mg | ORAL_CAPSULE | Freq: Two times a day (BID) | ORAL | 0 refills | Status: DC

## 2017-12-19 NOTE — Progress Notes (Signed)
Subjective:    Patient ID: [name], female    DOB: 1960/09/16, 57 y.o.   MRN: [id number]  HPI: [name] is a 57 year old female who returns for follow up appointment for chronic pain and medication refill. She states her pain is located in her mid to lower back, bilateral hip and bilateral knee pain. She rates her pain 9. Her current exercise regime is walking.  [name] Morphine Equivalent is 260.00 MME. Last UDS was Performed on 11/19/2017, it was consistent.   Pain Inventory Average Pain 8 Pain Right Now 9 My pain is constant, sharp, burning, dull, stabbing, tingling and aching  In the last 24 hours, has pain interfered with the following? General activity 10 Relation with others 10 Enjoyment of life 10 What TIME of day is your pain at its worst? . Sleep (in general) Poor  Pain is worse with: walking, bending, sitting, standing and some activites Pain improves with: rest, heat/ice, medication, TENS and injections Relief from Meds: .  Mobility walk with assistance use a cane use a walker ability to climb steps?  yes do you drive?  yes  Function disabled: date disabled 2007 I need assistance with the following:  bathing, meal prep, household duties and shopping  Neuro/Psych weakness numbness tremor tingling trouble walking spasms dizziness confusion depression anxiety  Prior Studies Any changes since last visit?  no  Physicians involved in your care Any changes since last visit?  no   Family History  Problem Relation Age of Onset  . Depression Mother   . Hypertension Mother   . Hypertension Father   . Heart failure Father   . Diabetes Father   . Heart attack Father   . Colon cancer Neg Hx   . Esophageal cancer Neg Hx   . Stomach cancer Neg Hx   . Rectal cancer Neg Hx    Social History   Socioeconomic History  . Marital status: Married    Spouse name: [name]  . Number of children: 1  . Years of education: some  college  . Highest education level: Not on file  Occupational History  . Occupation: On disability    Employer: UNEMPLOYED  Social Needs  . Financial resource strain: Not on file  . Food insecurity:    Worry: Not on file    Inability: Not on file  . Transportation needs:    Medical: Not on file    Non-medical: Not on file  Tobacco Use  . Smoking status: Former Smoker    Packs/day: 0.50    Types: Cigarettes    Last attempt to quit: 02/06/2016    Years since quitting: 1.8  . Smokeless tobacco: Never Used  . Tobacco comment: going to try e-sticks to quit  Substance and Sexual Activity  . Alcohol use: No    Alcohol/week: 0.0 standard drinks  . Drug use: No  . Sexual activity: Not Currently    Partners: Male  Lifestyle  . Physical activity:    Days per week: Not on file    Minutes per session: Not on file  . Stress: Not on file  Relationships  . Social connections:    Talks on phone: Not on file    Gets together: Not on file    Attends religious service: Not on file    Active member of club or organization: Not on file    Attends meetings of clubs or organizations: Not on file    Relationship status:  Not on file  Other Topics Concern  . Not on file  Social History Narrative   8-10 cups of soda a day.  On disability. No regular exercise.     Past Surgical History:  Procedure Laterality Date  . ABDOMINAL HYSTERECTOMY  May 2004  . Basel Cell Carcinoma  06/2005, 07/2005   nasal tip and reconstruction  . BREAST CYST ASPIRATION    . CARPAL TUNNEL RELEASE  07/1993   both hands  . COLONOSCOPY    . HEMORROIDECTOMY  July 2003  . KNEE ARTHROPLASTY  09/2001, 05/2003   left knee, chondromalasia  . KNEE ARTHROSCOPY  jan 2005   Right knee, tisse release, chrondromalacia  . REPLACEMENT TOTAL KNEE  Nov 2005   Left   . THYROID LOBECTOMY  01/2005   malignant area removed from right lobe  . THYROID SURGERY     thyroid lobe removal  . TONSILLECTOMY  09/1972  . TUBAL LIGATION  Sept  1999  . Ulnar nerve entrapment  Feb 1995   left elbow   Past Medical History:  Diagnosis Date  . Allergy   . Anxiety   . Cancer ([place])    cancerous nodules on thyroid  . Cancer ([place])    basal cell Cancer-nose  . Chronic pain syndrome   . COPD (chronic obstructive pulmonary disease) ([place])   . Degeneration of lumbar or lumbosacral intervertebral disc   . Depression   . Facet syndrome, lumbar   . GERD (gastroesophageal reflux disease)   . Hyperlipidemia   . Intervertebral lumbar disc disorder with myelopathy, lumbar region   . Lumbosacral spondylosis without myelopathy   . Migraine without aura, with intractable migraine, so stated, without mention of status migrainosus   . Primary localized osteoarthrosis, lower leg   . Thyroid disease    hypothyroid  . Unspecified musculoskeletal disorders and symptoms referable to neck    cervical/trapezius   There were no vitals taken for this visit.  Opioid Risk Score:   Fall Risk Score:  `1  Depression screen PHQ 2/9  Depression screen [facility] 2/9 10/11/2017 10/25/2016 08/25/2016 07/14/2016 11/10/2015 09/06/2015 03/16/2015  Decreased Interest 3 3 3 3 3 3 3   Down, Depressed, Hopeless 3 3 3 3 3  - 3  PHQ - 2 Score 6 6 6 6 6 3 6   Altered sleeping 3 - - - - - -  Tired, decreased energy 3 - - - - - -  Change in appetite 2 - - - - - -  Feeling bad or failure about yourself  3 - - - - - -  Trouble concentrating 3 - - - - - -  Moving slowly or fidgety/restless 0 - - - - - -  Suicidal thoughts 0 - - - - - -  PHQ-9 Score 20 - - - - - -  Difficult doing work/chores Extremely dIfficult - - - - - -  Some encounter information is confidential and restricted. Go to Review Flowsheets activity to see all data.  Some recent data might be hidden     Review of Systems  Constitutional: Positive for diaphoresis and unexpected weight change.  HENT: Negative.   Eyes: Negative.   Respiratory: Negative.   Cardiovascular: Negative.   Gastrointestinal: Negative.     Endocrine: Negative.   Genitourinary: Negative.   Musculoskeletal: Positive for arthralgias, back pain, gait problem and myalgias.  Skin: Negative.   Allergic/Immunologic: Negative.   Neurological: Positive for dizziness, tremors, weakness and numbness.  Hematological: Negative.  Psychiatric/Behavioral: Positive for dysphoric mood. The patient is nervous/anxious.   All other systems reviewed and are negative.      Objective:   Physical Exam  Constitutional: She is oriented to person, place, and time. She appears well-developed and well-nourished.  HENT:  Head: Normocephalic and atraumatic.  Neck: Normal range of motion. Neck supple.  Cardiovascular: Normal rate and regular rhythm.  Pulmonary/Chest: Effort normal and breath sounds normal.  Musculoskeletal:  Normal Muscle Bulk and Muscle Testing Reveals:  Upper Extremities: Full ROM and Muscle Strength 5/5 Thoracic Paraspinal Tenderness: T-6-T-7 Mainly Left Side Lumbar Hypersensitivity Lower Extremities: Decreased ROM and Muscle Strength 4/5 Bilateral Lower Extremities Flexion Produces Pain into Bilateral Patella's Arises from Table Slowly using cane for support   Neurological: She is alert and oriented to person, place, and time.  Skin: Skin is warm and dry.  Psychiatric: She has a normal mood and affect. Her behavior is normal.  Nursing note and vitals reviewed.         Assessment & Plan:  1.Chronic Bilateral Thoracic Back Pain/ Low Back Pain/ Lumbar Facet Arthropathy:RefilledOxycodone 15mg  one tablet every 6 hoursas needed #120and Embeda 100 mg every 12 hours #60.Continue Gabapentin and Pamelor. 12/19/2017 We will continue the opioid monitoring program, this consists of regular clinic visits, examinations, urine drug screen, pill counts as well as use of [place] Controlled Substance Reporting System. 2. Degenerative Disk Disease:Encouraged Continue HEP as Tolertatedand heat therapy. Continue Current  Medication Regime.12/19/2017 3. Bilateral Greater Trochanteric Bursitis: Continuecurrent treatment regimenwith Heat and Ice Therapy.12/19/2017 4. Osteoarthritis of Bilateral Knees: Continue current treatment modality with homeexerciseprogramand heat therapy. 12/19/2017 5. Migraine Headaches:Instructed to keep Migraine Journal.Continue LamictalandMaxalt. Continue to Monitor. 12/19/2017 6. Smoking Cessation: She has quit smokingsince 08/25/2016. Continue to Monitor.12/19/2017 7.Constipation: ContinueCurrent medication regime withLinzess.12/19/2017 8. Muscle Spasm: Continuecurrent medication regime withTizanidine.12/19/2017 9. DepressionAnxiety/ Panic Attacks: Continuecurrent medication regimen and treatment modality.Celexa.Lamictal and Counseling with Dr.AkhtartFollowing. 12/19/2017 10. RSD: Continue with current medication Regime with Gabapentin.12/19/2017  20 minutes of face to face patient care time was spent during this visit. All questions were encouraged and answered.   F/U in74month

## 2017-12-20 DIAGNOSIS — R1310 Dysphagia, unspecified: Secondary | ICD-10-CM | POA: Diagnosis not present

## 2017-12-20 DIAGNOSIS — K2950 Unspecified chronic gastritis without bleeding: Secondary | ICD-10-CM | POA: Diagnosis not present

## 2017-12-20 DIAGNOSIS — K3189 Other diseases of stomach and duodenum: Secondary | ICD-10-CM | POA: Diagnosis not present

## 2017-12-20 DIAGNOSIS — K209 Esophagitis, unspecified: Secondary | ICD-10-CM | POA: Diagnosis not present

## 2017-12-21 ENCOUNTER — Encounter: Payer: Self-pay | Admitting: Registered Nurse

## 2017-12-26 ENCOUNTER — Telehealth: Payer: Self-pay

## 2017-12-26 NOTE — Telephone Encounter (Addendum)
Pt called stating her pharmacy in [place] will have her Embeda in stock for this month but will not next month due to it being discontinued. She states 3 day left of medication.

## 2017-12-27 ENCOUNTER — Telehealth: Payer: Self-pay | Admitting: Registered Nurse

## 2017-12-27 NOTE — Telephone Encounter (Signed)
In the past [name] was prescribe MS Contin, due to the opioid crisis she was prescribed Morphabond her formulary wouldn't cover the above. She was changed to Embeda.  Embeda has been discontinued by [employer], this provider has spoken to the [employer] regarding the Embeda. They will no longer  dispense Embeda.  [name] will be prescribe Morphabond 100 mg every 12 hours, we are submitting a PA for the Morphabond.   We the Providers at [facility] prefer to prescribe abuse deterrent medication especially with the Opioid Crisis our State is facing. We are submitting a PA for Morphabond.

## 2017-12-28 DIAGNOSIS — J8410 Pulmonary fibrosis, unspecified: Secondary | ICD-10-CM | POA: Diagnosis not present

## 2017-12-28 DIAGNOSIS — W010XXA Fall on same level from slipping, tripping and stumbling without subsequent striking against object, initial encounter: Secondary | ICD-10-CM | POA: Diagnosis not present

## 2017-12-28 DIAGNOSIS — A419 Sepsis, unspecified organism: Secondary | ICD-10-CM | POA: Diagnosis not present

## 2017-12-28 DIAGNOSIS — Z91048 Other nonmedicinal substance allergy status: Secondary | ICD-10-CM | POA: Diagnosis not present

## 2017-12-28 DIAGNOSIS — A414 Sepsis due to anaerobes: Secondary | ICD-10-CM | POA: Diagnosis not present

## 2017-12-28 DIAGNOSIS — A4150 Gram-negative sepsis, unspecified: Secondary | ICD-10-CM | POA: Diagnosis not present

## 2017-12-28 DIAGNOSIS — C44311 Basal cell carcinoma of skin of nose: Secondary | ICD-10-CM | POA: Diagnosis not present

## 2017-12-28 DIAGNOSIS — T1490XA Injury, unspecified, initial encounter: Secondary | ICD-10-CM | POA: Diagnosis not present

## 2017-12-28 DIAGNOSIS — Z85850 Personal history of malignant neoplasm of thyroid: Secondary | ICD-10-CM | POA: Diagnosis not present

## 2017-12-28 DIAGNOSIS — Z79891 Long term (current) use of opiate analgesic: Secondary | ICD-10-CM | POA: Diagnosis not present

## 2017-12-28 DIAGNOSIS — R410 Disorientation, unspecified: Secondary | ICD-10-CM | POA: Diagnosis not present

## 2017-12-28 DIAGNOSIS — R5381 Other malaise: Secondary | ICD-10-CM | POA: Diagnosis not present

## 2017-12-28 DIAGNOSIS — Z888 Allergy status to other drugs, medicaments and biological substances status: Secondary | ICD-10-CM | POA: Diagnosis not present

## 2017-12-28 DIAGNOSIS — N3000 Acute cystitis without hematuria: Secondary | ICD-10-CM | POA: Diagnosis not present

## 2017-12-28 DIAGNOSIS — R0902 Hypoxemia: Secondary | ICD-10-CM | POA: Diagnosis not present

## 2017-12-28 DIAGNOSIS — G43909 Migraine, unspecified, not intractable, without status migrainosus: Secondary | ICD-10-CM | POA: Diagnosis not present

## 2017-12-28 DIAGNOSIS — I10 Essential (primary) hypertension: Secondary | ICD-10-CM | POA: Diagnosis not present

## 2017-12-28 DIAGNOSIS — Z79899 Other long term (current) drug therapy: Secondary | ICD-10-CM | POA: Diagnosis not present

## 2017-12-28 DIAGNOSIS — R911 Solitary pulmonary nodule: Secondary | ICD-10-CM | POA: Diagnosis not present

## 2017-12-28 DIAGNOSIS — R0689 Other abnormalities of breathing: Secondary | ICD-10-CM | POA: Diagnosis not present

## 2017-12-28 DIAGNOSIS — R9431 Abnormal electrocardiogram [ECG] [EKG]: Secondary | ICD-10-CM | POA: Diagnosis not present

## 2017-12-28 DIAGNOSIS — B961 Klebsiella pneumoniae [K. pneumoniae] as the cause of diseases classified elsewhere: Secondary | ICD-10-CM | POA: Diagnosis not present

## 2017-12-28 DIAGNOSIS — G894 Chronic pain syndrome: Secondary | ICD-10-CM | POA: Diagnosis not present

## 2017-12-28 DIAGNOSIS — E079 Disorder of thyroid, unspecified: Secondary | ICD-10-CM | POA: Diagnosis not present

## 2017-12-28 DIAGNOSIS — Z886 Allergy status to analgesic agent status: Secondary | ICD-10-CM | POA: Diagnosis not present

## 2017-12-28 DIAGNOSIS — E785 Hyperlipidemia, unspecified: Secondary | ICD-10-CM | POA: Diagnosis not present

## 2017-12-28 DIAGNOSIS — S0990XA Unspecified injury of head, initial encounter: Secondary | ICD-10-CM | POA: Diagnosis not present

## 2017-12-28 DIAGNOSIS — M1990 Unspecified osteoarthritis, unspecified site: Secondary | ICD-10-CM | POA: Diagnosis not present

## 2017-12-28 DIAGNOSIS — R404 Transient alteration of awareness: Secondary | ICD-10-CM | POA: Diagnosis not present

## 2017-12-28 DIAGNOSIS — G8929 Other chronic pain: Secondary | ICD-10-CM | POA: Diagnosis not present

## 2017-12-28 DIAGNOSIS — Z96652 Presence of left artificial knee joint: Secondary | ICD-10-CM | POA: Diagnosis not present

## 2017-12-28 DIAGNOSIS — R509 Fever, unspecified: Secondary | ICD-10-CM | POA: Diagnosis not present

## 2017-12-28 DIAGNOSIS — Z87891 Personal history of nicotine dependence: Secondary | ICD-10-CM | POA: Diagnosis not present

## 2017-12-28 DIAGNOSIS — R918 Other nonspecific abnormal finding of lung field: Secondary | ICD-10-CM | POA: Diagnosis not present

## 2017-12-28 DIAGNOSIS — I071 Rheumatic tricuspid insufficiency: Secondary | ICD-10-CM | POA: Diagnosis not present

## 2017-12-28 DIAGNOSIS — G9341 Metabolic encephalopathy: Secondary | ICD-10-CM | POA: Diagnosis not present

## 2018-01-01 ENCOUNTER — Telehealth: Payer: Self-pay | Admitting: *Deleted

## 2018-01-01 NOTE — Telephone Encounter (Signed)
Return [name] call. Spoke with [name] all questions answered and she verbalizes understanding.
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
## 2018-01-02 DIAGNOSIS — N3000 Acute cystitis without hematuria: Secondary | ICD-10-CM | POA: Diagnosis not present

## 2018-01-02 DIAGNOSIS — B961 Klebsiella pneumoniae [K. pneumoniae] as the cause of diseases classified elsewhere: Secondary | ICD-10-CM | POA: Diagnosis not present

## 2018-01-02 DIAGNOSIS — G8929 Other chronic pain: Secondary | ICD-10-CM | POA: Diagnosis not present

## 2018-01-02 DIAGNOSIS — E079 Disorder of thyroid, unspecified: Secondary | ICD-10-CM | POA: Diagnosis not present

## 2018-01-02 DIAGNOSIS — Z87891 Personal history of nicotine dependence: Secondary | ICD-10-CM | POA: Diagnosis not present

## 2018-01-02 DIAGNOSIS — Z96652 Presence of left artificial knee joint: Secondary | ICD-10-CM | POA: Diagnosis not present

## 2018-01-02 DIAGNOSIS — Z85850 Personal history of malignant neoplasm of thyroid: Secondary | ICD-10-CM | POA: Diagnosis not present

## 2018-01-02 DIAGNOSIS — A4159 Other Gram-negative sepsis: Secondary | ICD-10-CM | POA: Diagnosis not present

## 2018-01-02 DIAGNOSIS — Z9181 History of falling: Secondary | ICD-10-CM | POA: Diagnosis not present

## 2018-01-02 DIAGNOSIS — Z85828 Personal history of other malignant neoplasm of skin: Secondary | ICD-10-CM | POA: Diagnosis not present

## 2018-01-08 ENCOUNTER — Other Ambulatory Visit: Payer: Self-pay | Admitting: Physical Medicine & Rehabilitation

## 2018-01-08 ENCOUNTER — Other Ambulatory Visit: Payer: Self-pay | Admitting: Registered Nurse

## 2018-01-09 DIAGNOSIS — A4159 Other Gram-negative sepsis: Secondary | ICD-10-CM | POA: Diagnosis not present

## 2018-01-09 DIAGNOSIS — Z85850 Personal history of malignant neoplasm of thyroid: Secondary | ICD-10-CM | POA: Diagnosis not present

## 2018-01-09 DIAGNOSIS — N3000 Acute cystitis without hematuria: Secondary | ICD-10-CM | POA: Diagnosis not present

## 2018-01-09 DIAGNOSIS — Z96652 Presence of left artificial knee joint: Secondary | ICD-10-CM | POA: Diagnosis not present

## 2018-01-09 DIAGNOSIS — Z87891 Personal history of nicotine dependence: Secondary | ICD-10-CM | POA: Diagnosis not present

## 2018-01-09 DIAGNOSIS — G8929 Other chronic pain: Secondary | ICD-10-CM | POA: Diagnosis not present

## 2018-01-09 DIAGNOSIS — B961 Klebsiella pneumoniae [K. pneumoniae] as the cause of diseases classified elsewhere: Secondary | ICD-10-CM | POA: Diagnosis not present

## 2018-01-09 DIAGNOSIS — E079 Disorder of thyroid, unspecified: Secondary | ICD-10-CM | POA: Diagnosis not present

## 2018-01-09 DIAGNOSIS — Z9181 History of falling: Secondary | ICD-10-CM | POA: Diagnosis not present

## 2018-01-09 DIAGNOSIS — Z85828 Personal history of other malignant neoplasm of skin: Secondary | ICD-10-CM | POA: Diagnosis not present

## 2018-01-10 ENCOUNTER — Ambulatory Visit (INDEPENDENT_AMBULATORY_CARE_PROVIDER_SITE_OTHER): Payer: Medicare Other | Admitting: Licensed Clinical Social Worker

## 2018-01-10 DIAGNOSIS — F331 Major depressive disorder, recurrent, moderate: Secondary | ICD-10-CM

## 2018-01-10 DIAGNOSIS — G894 Chronic pain syndrome: Secondary | ICD-10-CM

## 2018-01-10 DIAGNOSIS — M5136 Other intervertebral disc degeneration, lumbar region: Secondary | ICD-10-CM | POA: Diagnosis not present

## 2018-01-10 DIAGNOSIS — F411 Generalized anxiety disorder: Secondary | ICD-10-CM | POA: Diagnosis not present

## 2018-01-10 DIAGNOSIS — M51369 Other intervertebral disc degeneration, lumbar region without mention of lumbar back pain or lower extremity pain: Secondary | ICD-10-CM
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
## 2018-01-11 DIAGNOSIS — Z87891 Personal history of nicotine dependence: Secondary | ICD-10-CM | POA: Diagnosis not present

## 2018-01-11 DIAGNOSIS — E079 Disorder of thyroid, unspecified: Secondary | ICD-10-CM | POA: Diagnosis not present

## 2018-01-11 DIAGNOSIS — Z85850 Personal history of malignant neoplasm of thyroid: Secondary | ICD-10-CM | POA: Diagnosis not present

## 2018-01-11 DIAGNOSIS — Z9181 History of falling: Secondary | ICD-10-CM | POA: Diagnosis not present

## 2018-01-11 DIAGNOSIS — B961 Klebsiella pneumoniae [K. pneumoniae] as the cause of diseases classified elsewhere: Secondary | ICD-10-CM | POA: Diagnosis not present

## 2018-01-11 DIAGNOSIS — A4159 Other Gram-negative sepsis: Secondary | ICD-10-CM | POA: Diagnosis not present

## 2018-01-11 DIAGNOSIS — N3000 Acute cystitis without hematuria: Secondary | ICD-10-CM | POA: Diagnosis not present

## 2018-01-11 DIAGNOSIS — Z85828 Personal history of other malignant neoplasm of skin: Secondary | ICD-10-CM | POA: Diagnosis not present

## 2018-01-11 DIAGNOSIS — Z96652 Presence of left artificial knee joint: Secondary | ICD-10-CM | POA: Diagnosis not present

## 2018-01-11 DIAGNOSIS — G8929 Other chronic pain: Secondary | ICD-10-CM | POA: Diagnosis not present

## 2018-01-11 NOTE — Telephone Encounter (Signed)
Prior authorization for Morphabond denied. Insurance states that there are 2 more medications that need to be satisfied before Morphabond will be considered.  They are Xtampza and Hysingla.

## 2018-01-15 ENCOUNTER — Telehealth: Payer: Self-pay | Admitting: Registered Nurse

## 2018-01-15 DIAGNOSIS — Z5181 Encounter for therapeutic drug level monitoring: Secondary | ICD-10-CM

## 2018-01-15 DIAGNOSIS — M1712 Unilateral primary osteoarthritis, left knee: Secondary | ICD-10-CM

## 2018-01-15 DIAGNOSIS — M1711 Unilateral primary osteoarthritis, right knee: Secondary | ICD-10-CM

## 2018-01-15 DIAGNOSIS — G894 Chronic pain syndrome: Secondary | ICD-10-CM

## 2018-01-15 DIAGNOSIS — Z79899 Other long term (current) drug therapy: Secondary | ICD-10-CM

## 2018-01-15 DIAGNOSIS — G43119 Migraine with aura, intractable, without status migrainosus: Secondary | ICD-10-CM

## 2018-01-15 MED ORDER — MORPHINE SULFATE ER 100 MG PO TBCR: 100.0000 mg | EXTENDED_RELEASE_TABLET | Freq: Two times a day (BID) | ORAL | 0 refills | Status: DC

## 2018-01-15 MED ORDER — MORPHINE SULFATE 15 MG PO TABS: 15.0000 mg | ORAL_TABLET | Freq: Four times a day (QID) | ORAL | 0 refills | Status: DC | PRN
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
## 2018-01-18 ENCOUNTER — Encounter: Payer: Medicare Other | Admitting: Registered Nurse

## 2018-01-18 ENCOUNTER — Telehealth: Payer: Self-pay | Admitting: Family Medicine

## 2018-01-18 NOTE — Telephone Encounter (Signed)
I saw where she was admitted for sepsis to hospital.  She really needs to schedule a hospital f/u with me so we can recheck her urine and labs.

## 2018-01-22 DIAGNOSIS — G8929 Other chronic pain: Secondary | ICD-10-CM | POA: Diagnosis not present

## 2018-01-22 DIAGNOSIS — Z96652 Presence of left artificial knee joint: Secondary | ICD-10-CM | POA: Diagnosis not present

## 2018-01-22 DIAGNOSIS — B961 Klebsiella pneumoniae [K. pneumoniae] as the cause of diseases classified elsewhere: Secondary | ICD-10-CM | POA: Diagnosis not present

## 2018-01-22 DIAGNOSIS — E079 Disorder of thyroid, unspecified: Secondary | ICD-10-CM | POA: Diagnosis not present

## 2018-01-22 DIAGNOSIS — Z85828 Personal history of other malignant neoplasm of skin: Secondary | ICD-10-CM | POA: Diagnosis not present

## 2018-01-22 DIAGNOSIS — N3000 Acute cystitis without hematuria: Secondary | ICD-10-CM | POA: Diagnosis not present

## 2018-01-22 DIAGNOSIS — A4159 Other Gram-negative sepsis: Secondary | ICD-10-CM | POA: Diagnosis not present

## 2018-01-22 DIAGNOSIS — Z87891 Personal history of nicotine dependence: Secondary | ICD-10-CM | POA: Diagnosis not present

## 2018-01-22 DIAGNOSIS — Z9181 History of falling: Secondary | ICD-10-CM | POA: Diagnosis not present

## 2018-01-22 DIAGNOSIS — Z85850 Personal history of malignant neoplasm of thyroid: Secondary | ICD-10-CM | POA: Diagnosis not present

## 2018-01-23 NOTE — Telephone Encounter (Signed)
Patient scheduled.

## 2018-01-24 ENCOUNTER — Other Ambulatory Visit: Payer: Self-pay | Admitting: Registered Nurse

## 2018-01-24 DIAGNOSIS — G8929 Other chronic pain: Secondary | ICD-10-CM | POA: Diagnosis not present

## 2018-01-24 DIAGNOSIS — B961 Klebsiella pneumoniae [K. pneumoniae] as the cause of diseases classified elsewhere: Secondary | ICD-10-CM | POA: Diagnosis not present

## 2018-01-24 DIAGNOSIS — E079 Disorder of thyroid, unspecified: Secondary | ICD-10-CM | POA: Diagnosis not present

## 2018-01-24 DIAGNOSIS — Z85850 Personal history of malignant neoplasm of thyroid: Secondary | ICD-10-CM | POA: Diagnosis not present

## 2018-01-24 DIAGNOSIS — A4159 Other Gram-negative sepsis: Secondary | ICD-10-CM | POA: Diagnosis not present

## 2018-01-24 DIAGNOSIS — Z85828 Personal history of other malignant neoplasm of skin: Secondary | ICD-10-CM | POA: Diagnosis not present

## 2018-01-24 DIAGNOSIS — Z96652 Presence of left artificial knee joint: Secondary | ICD-10-CM | POA: Diagnosis not present

## 2018-01-24 DIAGNOSIS — Z9181 History of falling: Secondary | ICD-10-CM | POA: Diagnosis not present

## 2018-01-24 DIAGNOSIS — N3000 Acute cystitis without hematuria: Secondary | ICD-10-CM | POA: Diagnosis not present

## 2018-01-24 DIAGNOSIS — Z87891 Personal history of nicotine dependence: Secondary | ICD-10-CM | POA: Diagnosis not present

## 2018-01-25 ENCOUNTER — Ambulatory Visit (INDEPENDENT_AMBULATORY_CARE_PROVIDER_SITE_OTHER): Payer: Medicare Other | Admitting: Licensed Clinical Social Worker

## 2018-01-25 DIAGNOSIS — G894 Chronic pain syndrome: Secondary | ICD-10-CM | POA: Diagnosis not present

## 2018-01-25 DIAGNOSIS — F331 Major depressive disorder, recurrent, moderate: Secondary | ICD-10-CM

## 2018-01-25 DIAGNOSIS — M5136 Other intervertebral disc degeneration, lumbar region: Secondary | ICD-10-CM | POA: Diagnosis not present

## 2018-01-25 DIAGNOSIS — F411 Generalized anxiety disorder: Secondary | ICD-10-CM
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
## 2018-01-28 ENCOUNTER — Encounter: Payer: Self-pay | Admitting: Family Medicine

## 2018-01-28 ENCOUNTER — Ambulatory Visit (INDEPENDENT_AMBULATORY_CARE_PROVIDER_SITE_OTHER): Payer: Medicare Other | Admitting: Family Medicine

## 2018-01-28 VITALS — BP 134/79 | HR 90 | Ht 68.0 in | Wt 255.0 lb

## 2018-01-28 DIAGNOSIS — N3000 Acute cystitis without hematuria: Secondary | ICD-10-CM | POA: Diagnosis not present

## 2018-01-28 DIAGNOSIS — A414 Sepsis due to anaerobes: Secondary | ICD-10-CM | POA: Diagnosis not present

## 2018-01-28 DIAGNOSIS — A4159 Other Gram-negative sepsis: Secondary | ICD-10-CM

## 2018-01-28 DIAGNOSIS — Z87891 Personal history of nicotine dependence: Secondary | ICD-10-CM | POA: Diagnosis not present

## 2018-01-28 DIAGNOSIS — R9389 Abnormal findings on diagnostic imaging of other specified body structures: Secondary | ICD-10-CM | POA: Diagnosis not present

## 2018-01-28 DIAGNOSIS — Z96652 Presence of left artificial knee joint: Secondary | ICD-10-CM | POA: Diagnosis not present

## 2018-01-28 DIAGNOSIS — Z85828 Personal history of other malignant neoplasm of skin: Secondary | ICD-10-CM | POA: Diagnosis not present

## 2018-01-28 DIAGNOSIS — G8929 Other chronic pain: Secondary | ICD-10-CM | POA: Diagnosis not present

## 2018-01-28 DIAGNOSIS — B961 Klebsiella pneumoniae [K. pneumoniae] as the cause of diseases classified elsewhere: Secondary | ICD-10-CM | POA: Diagnosis not present

## 2018-01-28 DIAGNOSIS — E079 Disorder of thyroid, unspecified: Secondary | ICD-10-CM | POA: Diagnosis not present

## 2018-01-28 DIAGNOSIS — Z85850 Personal history of malignant neoplasm of thyroid: Secondary | ICD-10-CM | POA: Diagnosis not present

## 2018-01-28 DIAGNOSIS — Z9181 History of falling: Secondary | ICD-10-CM | POA: Diagnosis not present

## 2018-01-28 NOTE — Progress Notes (Addendum)
Hospital follow-up-established Patient Office Visit  Subjective:  Patient ID: [name], female    DOB: [birth year]  Age: 57 y.o. MRN: [id number]  CC:  Chief Complaint  Patient presents with  . Hospitalization Follow-up    HPI RAPHAEL ESPE presents for hospital follow-up.  She was admitted on November 22 and discharged home 2 days later on November 25.  She was diagnosed with acute cystitis without hematuria complicated with acute encephalopathy.  She was actually admitted to the intensive care unit unit for persistent fever and encephalopathy.  He had a negative head CT.  The fever improved on Zosyn.  Blood cultures noted Klebsiella pneumonia a.  Sensitivities were notable for Cipro and she was discharged home with Cipro.  She has completed the antibiotics and feels like she is back to her old self.  She has not had any urinary symptoms since then.  She does have her appointment with digestive health scheduled for January 14.  She is getting home physical therapy 3 days/week.  In fact that she is about to wrap that up.  And she also got a new so emotional support animal.   Past Medical History:  Diagnosis Date  . Allergy   . Anxiety   . Cancer ([place])    cancerous nodules on thyroid  . Cancer ([place])    basal cell Cancer-nose  . Chronic pain syndrome   . COPD (chronic obstructive pulmonary disease) ([place])   . Degeneration of lumbar or lumbosacral intervertebral disc   . Depression   . Facet syndrome, lumbar   . GERD (gastroesophageal reflux disease)   . Hyperlipidemia   . Intervertebral lumbar disc disorder with myelopathy, lumbar region   . Lumbosacral spondylosis without myelopathy   . Migraine without aura, with intractable migraine, so stated, without mention of status migrainosus   . Primary localized osteoarthrosis, lower leg   . Thyroid disease    hypothyroid  . Unspecified musculoskeletal disorders and symptoms referable to neck    cervical/trapezius     Past Surgical History:  Procedure Laterality Date  . ABDOMINAL HYSTERECTOMY  May 2004  . Basel Cell Carcinoma  06/2005, 07/2005   nasal tip and reconstruction  . BREAST CYST ASPIRATION    . CARPAL TUNNEL RELEASE  07/1993   both hands  . COLONOSCOPY    . HEMORROIDECTOMY  July 2003  . KNEE ARTHROPLASTY  09/2001, 05/2003   left knee, chondromalasia  . KNEE ARTHROSCOPY  jan 2005   Right knee, tisse release, chrondromalacia  . REPLACEMENT TOTAL KNEE  Nov 2005   Left   . THYROID LOBECTOMY  01/2005   malignant area removed from right lobe  . THYROID SURGERY     thyroid lobe removal  . TONSILLECTOMY  09/1972  . TUBAL LIGATION  Sept 1999  . Ulnar nerve entrapment  Feb 1995   left elbow    Family History  Problem Relation Age of Onset  . Depression Mother   . Hypertension Mother   . Hypertension Father   . Heart failure Father   . Diabetes Father   . Heart attack Father   . Colon cancer Neg Hx   . Esophageal cancer Neg Hx   . Stomach cancer Neg Hx   . Rectal cancer Neg Hx     Social History   Socioeconomic History  . Marital status: Married    Spouse name: [name]  . Number of children: 1  . Years of education: some college  . Highest  education level: Not on file  Occupational History  . Occupation: On disability    Employer: UNEMPLOYED  Social Needs  . Financial resource strain: Not on file  . Food insecurity:    Worry: Not on file    Inability: Not on file  . Transportation needs:    Medical: Not on file    Non-medical: Not on file  Tobacco Use  . Smoking status: Former Smoker    Packs/day: 0.50    Types: Cigarettes    Last attempt to quit: 02/06/2016    Years since quitting: 1.9  . Smokeless tobacco: Never Used  . Tobacco comment: going to try e-sticks to quit  Substance and Sexual Activity  . Alcohol use: No    Alcohol/week: 0.0 standard drinks  . Drug use: No  . Sexual activity: Not Currently    Partners: Male  Lifestyle  . Physical activity:     Days per week: Not on file    Minutes per session: Not on file  . Stress: Not on file  Relationships  . Social connections:    Talks on phone: Not on file    Gets together: Not on file    Attends religious service: Not on file    Active member of club or organization: Not on file    Attends meetings of clubs or organizations: Not on file    Relationship status: Not on file  . Intimate partner violence:    Fear of current or ex partner: Not on file    Emotionally abused: Not on file    Physically abused: Not on file    Forced sexual activity: Not on file  Other Topics Concern  . Not on file  Social History Narrative   8-10 cups of soda a day.  On disability. No regular exercise.      Outpatient Medications Prior to Visit  Medication Sig Dispense Refill  . citalopram (CELEXA) 40 MG tablet TAKE 1 TABLET(40 MG) BY MOUTH AT BEDTIME 30 tablet 5  . gabapentin (NEURONTIN) 800 MG tablet TAKE 1 TABLET(800 MG) BY MOUTH FOUR TIMES DAILY 120 tablet 3  . lactobacillus acidophilus & bulgar (LACTINEX) chewable tablet Chew 1 tablet by mouth 3 (three) times daily with meals.  0  . lamoTRIgine (LAMICTAL) 25 MG tablet TAKE 1 TABLET(25 MG) BY MOUTH DAILY 30 tablet 2  . levothyroxine (SYNTHROID, LEVOTHROID) 25 MCG tablet TAKE 1 TABLET(25 MCG) BY MOUTH DAILY BEFORE BREAKFAST 90 tablet 1  . LINZESS 145 MCG CAPS capsule TAKE 1 CAPSULE BY MOUTH DAILY 30 capsule 3  . morphine (MS CONTIN) 100 MG 12 hr tablet Take 1 tablet (100 mg total) by mouth every 12 (twelve) hours. 60 tablet 0  . morphine (MSIR) 15 MG tablet Take 1 tablet (15 mg total) by mouth every 6 (six) hours as needed for severe pain. 120 tablet 0  . nortriptyline (PAMELOR) 50 MG capsule TAKE 2 CAPSULES BY MOUTH AT BEDTIME 180 capsule 0  . pravastatin (PRAVACHOL) 40 MG tablet TAKE 1 TABLET BY MOUTH DAILY 90 tablet 3  . promethazine (PHENERGAN) 12.5 MG tablet TAKE 1 TABLET(12.5 MG) BY MOUTH EVERY 12 HOURS 30 tablet 2  . rizatriptan (MAXALT) 10 MG  tablet TAKE 1 TABLET BY MOUTH IF NEEDED. MAY REPEAT AFTER 2 HOURS IF NEEDED 30 tablet 2  . tiZANidine (ZANAFLEX) 2 MG tablet TAKE 1 TABLET BY MOUTH THREE TIMES DAILY AS NEEDED FOR MUSCLE SPASMS 90 tablet 5   No facility-administered medications prior to visit.  Allergies  Allergen Reactions  . Aspirin Other (See Comments)    Abdominal bleeding   . Ibuprofen Swelling  . Imitrex [Sumatriptan Base] Other (See Comments)    Drops BP   . Norvasc [Amlodipine Besylate] Swelling  . Nsaids Swelling  . Tape     Skin blisters under surgical tape  . Topamax [Topiramate] Other (See Comments)    Hair loss    ROS Review of Systems    Objective:    Physical Exam  BP 134/79   Pulse 90   Ht 5\' 8"  (1.727 m)   Wt 255 lb (115.7 kg)   SpO2 97%   BMI 38.77 kg/m  Wt Readings from Last 3 Encounters:  01/28/18 255 lb (115.7 kg)  12/19/17 253 lb 6.4 oz (114.9 kg)  11/19/17 264 lb (119.7 kg)     Health Maintenance Due  Topic Date Due  . HIV Screening  04/25/1973    There are no preventive care reminders to display for this patient.  Lab Results  Component Value Date   TSH 4.59 (H) 10/11/2017   Lab Results  Component Value Date   WBC 10.4 06/17/2012   HGB 15.4 (H) 06/17/2012   HCT 46.5 (H) 06/17/2012   MCV 89.3 06/17/2012   PLT 350 06/17/2012   Lab Results  Component Value Date   NA 137 10/11/2017   K 4.5 10/11/2017   CO2 24 10/11/2017   GLUCOSE 120 (H) 10/11/2017   BUN 9 10/11/2017   CREATININE 0.98 10/11/2017   BILITOT 0.9 11/01/2017   ALKPHOS 105 05/26/2015   AST 100 (H) 11/01/2017   ALT 39 (H) 11/01/2017   PROT 6.1 11/01/2017   ALBUMIN 4.0 05/26/2015   CALCIUM 9.0 10/11/2017   Lab Results  Component Value Date   CHOL 131 10/11/2017   Lab Results  Component Value Date   HDL 47 (L) 10/11/2017   Lab Results  Component Value Date   LDLCALC 60 10/11/2017   Lab Results  Component Value Date   TRIG 162 (H) 10/11/2017   Lab Results  Component Value  Date   CHOLHDL 2.8 10/11/2017   Lab Results  Component Value Date   HGBA1C 6.1 (H) 10/11/2017      Assessment & Plan:   Problem List Items Addressed This Visit    None    Visit Diagnoses    Sepsis due to Klebsiella [facility])    -  Primary   Acute cystitis without hematuria       Relevant Orders   Urine Culture   Abnormal chest x-ray       Relevant Orders   DG Chest 2 View     Recent admission for sepsis caused by urinary tract infection caused by Klebsiella.  She is feeling much better and did complete her course of antibiotics.  Call if any problems.  Recommend repeat urine culture just to confirm that the infection has cleared.  Possible right pulmonary nodule seen on portable chest film.  They recommended short-term follow-up.  They said it could be just fibrosis versus a nodule and measured it at approximately 2 cm but it was ill-defined.  No orders of the defined types were placed in this encounter.   Follow-up: Return if symptoms worsen or fail to improve.    Beatrice Lecher, MD

## 2018-01-29 LAB — URINE CULTURE
MICRO NUMBER: 91533720
SPECIMEN QUALITY:: ADEQUATE

## 2018-02-01 DIAGNOSIS — Z96652 Presence of left artificial knee joint: Secondary | ICD-10-CM | POA: Diagnosis not present

## 2018-02-01 DIAGNOSIS — G8929 Other chronic pain: Secondary | ICD-10-CM | POA: Diagnosis not present

## 2018-02-01 DIAGNOSIS — Z87891 Personal history of nicotine dependence: Secondary | ICD-10-CM | POA: Diagnosis not present

## 2018-02-01 DIAGNOSIS — Z85828 Personal history of other malignant neoplasm of skin: Secondary | ICD-10-CM | POA: Diagnosis not present

## 2018-02-01 DIAGNOSIS — E079 Disorder of thyroid, unspecified: Secondary | ICD-10-CM | POA: Diagnosis not present

## 2018-02-01 DIAGNOSIS — A4159 Other Gram-negative sepsis: Secondary | ICD-10-CM | POA: Diagnosis not present

## 2018-02-01 DIAGNOSIS — N3000 Acute cystitis without hematuria: Secondary | ICD-10-CM | POA: Diagnosis not present

## 2018-02-01 DIAGNOSIS — Z85850 Personal history of malignant neoplasm of thyroid: Secondary | ICD-10-CM | POA: Diagnosis not present

## 2018-02-01 DIAGNOSIS — B961 Klebsiella pneumoniae [K. pneumoniae] as the cause of diseases classified elsewhere: Secondary | ICD-10-CM | POA: Diagnosis not present

## 2018-02-01 DIAGNOSIS — Z9181 History of falling: Secondary | ICD-10-CM | POA: Diagnosis not present

## 2018-02-04 DIAGNOSIS — B961 Klebsiella pneumoniae [K. pneumoniae] as the cause of diseases classified elsewhere: Secondary | ICD-10-CM | POA: Diagnosis not present

## 2018-02-04 DIAGNOSIS — Z87891 Personal history of nicotine dependence: Secondary | ICD-10-CM | POA: Diagnosis not present

## 2018-02-04 DIAGNOSIS — Z96652 Presence of left artificial knee joint: Secondary | ICD-10-CM | POA: Diagnosis not present

## 2018-02-04 DIAGNOSIS — Z85850 Personal history of malignant neoplasm of thyroid: Secondary | ICD-10-CM | POA: Diagnosis not present

## 2018-02-04 DIAGNOSIS — Z85828 Personal history of other malignant neoplasm of skin: Secondary | ICD-10-CM | POA: Diagnosis not present

## 2018-02-04 DIAGNOSIS — E079 Disorder of thyroid, unspecified: Secondary | ICD-10-CM | POA: Diagnosis not present

## 2018-02-04 DIAGNOSIS — N3000 Acute cystitis without hematuria: Secondary | ICD-10-CM | POA: Diagnosis not present

## 2018-02-04 DIAGNOSIS — Z9181 History of falling: Secondary | ICD-10-CM | POA: Diagnosis not present

## 2018-02-04 DIAGNOSIS — A4159 Other Gram-negative sepsis: Secondary | ICD-10-CM | POA: Diagnosis not present

## 2018-02-04 DIAGNOSIS — G8929 Other chronic pain: Secondary | ICD-10-CM | POA: Diagnosis not present

## 2018-02-05 ENCOUNTER — Encounter: Payer: Medicare Other | Attending: Physical Medicine and Rehabilitation | Admitting: Registered Nurse

## 2018-02-05 ENCOUNTER — Encounter: Payer: Self-pay | Admitting: Registered Nurse

## 2018-02-05 VITALS — BP 151/87 | HR 98 | Ht 68.0 in | Wt 257.0 lb

## 2018-02-05 DIAGNOSIS — M47816 Spondylosis without myelopathy or radiculopathy, lumbar region: Secondary | ICD-10-CM

## 2018-02-05 DIAGNOSIS — M5136 Other intervertebral disc degeneration, lumbar region: Secondary | ICD-10-CM

## 2018-02-05 DIAGNOSIS — G8929 Other chronic pain: Secondary | ICD-10-CM

## 2018-02-05 DIAGNOSIS — M546 Pain in thoracic spine: Secondary | ICD-10-CM | POA: Diagnosis not present

## 2018-02-05 DIAGNOSIS — M7061 Trochanteric bursitis, right hip: Secondary | ICD-10-CM | POA: Diagnosis not present

## 2018-02-05 DIAGNOSIS — M1711 Unilateral primary osteoarthritis, right knee: Secondary | ICD-10-CM

## 2018-02-05 DIAGNOSIS — M7062 Trochanteric bursitis, left hip: Secondary | ICD-10-CM

## 2018-02-05 DIAGNOSIS — Z760 Encounter for issue of repeat prescription: Secondary | ICD-10-CM | POA: Diagnosis not present

## 2018-02-05 DIAGNOSIS — M1712 Unilateral primary osteoarthritis, left knee: Secondary | ICD-10-CM

## 2018-02-05 DIAGNOSIS — G894 Chronic pain syndrome: Secondary | ICD-10-CM

## 2018-02-05 DIAGNOSIS — M51369 Other intervertebral disc degeneration, lumbar region without mention of lumbar back pain or lower extremity pain: Secondary | ICD-10-CM

## 2018-02-05 DIAGNOSIS — Z5181 Encounter for therapeutic drug level monitoring: Secondary | ICD-10-CM

## 2018-02-05 DIAGNOSIS — G9050 Complex regional pain syndrome I, unspecified: Secondary | ICD-10-CM

## 2018-02-05 DIAGNOSIS — G43119 Migraine with aura, intractable, without status migrainosus: Secondary | ICD-10-CM

## 2018-02-05 DIAGNOSIS — Z79899 Other long term (current) drug therapy: Secondary | ICD-10-CM

## 2018-02-05 DIAGNOSIS — F329 Major depressive disorder, single episode, unspecified: Secondary | ICD-10-CM

## 2018-02-05 DIAGNOSIS — M7918 Myalgia, other site: Secondary | ICD-10-CM

## 2018-02-05 MED ORDER — MORPHINE SULFATE 15 MG PO TABS: 15.0000 mg | ORAL_TABLET | Freq: Four times a day (QID) | ORAL | 0 refills | Status: DC | PRN

## 2018-02-05 MED ORDER — MORPHINE SULFATE ER 100 MG PO TBCR: 100.0000 mg | EXTENDED_RELEASE_TABLET | Freq: Two times a day (BID) | ORAL | 0 refills | Status: DC

## 2018-02-05 NOTE — Progress Notes (Signed)
Subjective:    Patient ID: [name], female    DOB: 13-Jun-1960, 57 y.o.   MRN: [id number]  HPI: [name] is a 57 y.o. female who returns for follow up appointment for chronic pain and medication refill. She states her pain is located in her neck, mid-lower back, bilateral hip and bilateral knee pain.She rates her pain 8. Her current exercise regime is physical therapy twice week, walking and performing stretching exercises.  Ms. Weidmann was admitted to [facility] on 12/28/2017 and discharge on 12/31/2017 for Sepsis, Klebsiella bacteremia, acute cystitis without hematuria. Note was reviewed.   Ms. Phillis  Morphine equivalent is 260.00 MME.  Last UDS was performed on 11/19/2017, it was consistent.  Pain Inventory Average Pain 8 Pain Right Now 8 My pain is constant, sharp, burning, dull, stabbing, tingling and aching  In the last 24 hours, has pain interfered with the following? General activity 10 Relation with others 10 Enjoyment of life 10 What TIME of day is your pain at its worst? na Sleep (in general) Poor  Pain is worse with: walking, bending, standing and some activites Pain improves with: rest, heat/ice, therapy/exercise, pacing activities, medication, TENS and injections Relief from Meds: 7  Mobility use a cane use a walker ability to climb steps?  yes do you drive?  yes  Function disabled: date disabled 2005 I need assistance with the following:  bathing, meal prep, household duties and shopping  Neuro/Psych weakness numbness tremor tingling trouble walking spasms dizziness confusion depression suicidal thoughts  Prior Studies Any changes since last visit?  no  Physicians involved in your care Any changes since last visit?  no   Family History  Problem Relation Age of Onset  . Depression Mother   . Hypertension Mother   . Hypertension Father   . Heart failure Father   . Diabetes Father   . Heart attack Father   .  Colon cancer Neg Hx   . Esophageal cancer Neg Hx   . Stomach cancer Neg Hx   . Rectal cancer Neg Hx    Social History   Socioeconomic History  . Marital status: Married    Spouse name: [name]  . Number of children: 1  . Years of education: some college  . Highest education level: Not on file  Occupational History  . Occupation: On disability    Employer: UNEMPLOYED  Social Needs  . Financial resource strain: Not on file  . Food insecurity:    Worry: Not on file    Inability: Not on file  . Transportation needs:    Medical: Not on file    Non-medical: Not on file  Tobacco Use  . Smoking status: Former Smoker    Packs/day: 0.50    Types: Cigarettes    Last attempt to quit: 02/06/2016    Years since quitting: 2.0  . Smokeless tobacco: Never Used  . Tobacco comment: going to try e-sticks to quit  Substance and Sexual Activity  . Alcohol use: No    Alcohol/week: 0.0 standard drinks  . Drug use: No  . Sexual activity: Not Currently    Partners: Male  Lifestyle  . Physical activity:    Days per week: Not on file    Minutes per session: Not on file  . Stress: Not on file  Relationships  . Social connections:    Talks on phone: Not on file    Gets together: Not on file    Attends religious service: Not  on file    Active member of club or organization: Not on file    Attends meetings of clubs or organizations: Not on file    Relationship status: Not on file  Other Topics Concern  . Not on file  Social History Narrative   8-10 cups of soda a day.  On disability. No regular exercise.     Past Surgical History:  Procedure Laterality Date  . ABDOMINAL HYSTERECTOMY  May 2004  . Basel Cell Carcinoma  06/2005, 07/2005   nasal tip and reconstruction  . BREAST CYST ASPIRATION    . CARPAL TUNNEL RELEASE  07/1993   both hands  . COLONOSCOPY    . HEMORROIDECTOMY  July 2003  . KNEE ARTHROPLASTY  09/2001, 05/2003   left knee, chondromalasia  . KNEE ARTHROSCOPY  jan 2005    Right knee, tisse release, chrondromalacia  . REPLACEMENT TOTAL KNEE  Nov 2005   Left   . THYROID LOBECTOMY  01/2005   malignant area removed from right lobe  . THYROID SURGERY     thyroid lobe removal  . TONSILLECTOMY  09/1972  . TUBAL LIGATION  Sept 1999  . Ulnar nerve entrapment  Feb 1995   left elbow   Past Medical History:  Diagnosis Date  . Allergy   . Anxiety   . Cancer ([place])    cancerous nodules on thyroid  . Cancer ([place])    basal cell Cancer-nose  . Chronic pain syndrome   . COPD (chronic obstructive pulmonary disease) ([place])   . Degeneration of lumbar or lumbosacral intervertebral disc   . Depression   . Facet syndrome, lumbar   . GERD (gastroesophageal reflux disease)   . Hyperlipidemia   . Intervertebral lumbar disc disorder with myelopathy, lumbar region   . Lumbosacral spondylosis without myelopathy   . Migraine without aura, with intractable migraine, so stated, without mention of status migrainosus   . Primary localized osteoarthrosis, lower leg   . Thyroid disease    hypothyroid  . Unspecified musculoskeletal disorders and symptoms referable to neck    cervical/trapezius   There were no vitals taken for this visit.  Opioid Risk Score:   Fall Risk Score:  `1  Depression screen PHQ 2/9  Depression screen [facility] 2/9 01/28/2018 01/28/2018 10/11/2017 10/25/2016 08/25/2016 07/14/2016 11/10/2015  Decreased Interest 3 3 3 3 3 3 3   Down, Depressed, Hopeless 3 3 3 3 3 3 3   PHQ - 2 Score 6 6 6 6 6 6 6   Altered sleeping 3 3 3  - - - -  Tired, decreased energy 3 3 3  - - - -  Change in appetite 3 3 2  - - - -  Feeling bad or failure about yourself  3 3 3  - - - -  Trouble concentrating 1 1 3  - - - -  Moving slowly or fidgety/restless 0 0 0 - - - -  Suicidal thoughts 2 2 0 - - - -  PHQ-9 Score 21 21 20  - - - -  Difficult doing work/chores Extremely dIfficult Extremely dIfficult Extremely dIfficult - - - -  Some encounter information is confidential and restricted. Go to  Review Flowsheets activity to see all data.  Some recent data might be hidden     Review of Systems  Constitutional: Positive for appetite change, diaphoresis and unexpected weight change.  HENT: Negative.   Eyes: Negative.   Respiratory: Positive for cough.   Cardiovascular: Negative.   Gastrointestinal: Positive for nausea and vomiting.  Endocrine: Negative.   Genitourinary: Negative.   Musculoskeletal: Positive for arthralgias, back pain, gait problem, joint swelling and myalgias.  Skin: Negative.   Allergic/Immunologic: Negative.   Neurological: Positive for dizziness, tremors, weakness and numbness.  Hematological: Negative.   Psychiatric/Behavioral: Positive for confusion and dysphoric mood. The patient is nervous/anxious.   All other systems reviewed and are negative.      Objective:   Physical Exam Vitals signs and nursing note reviewed.  Constitutional:      Appearance: Normal appearance.  Neck:     Musculoskeletal: Normal range of motion and neck supple.     Comments: Cervical Paraspinal Tenderness: C-5-C-6 Cardiovascular:     Rate and Rhythm: Normal rate and regular rhythm.     Pulses: Normal pulses.     Heart sounds: Normal heart sounds.  Pulmonary:     Effort: Pulmonary effort is normal.     Breath sounds: Normal breath sounds.  Musculoskeletal:     Comments: Normal Muscle Bulk and Muscle Testing Reveals:  Upper Extremities: Full ROM and Muscle Strength 5/5 Thoracic Paraspinal Tenderness: T-7-T-9 Lumbar Paraspinal Tenderness: L-3-L-5  Lower Extremities: Decreased ROM and Muscle Strength 5/5 Bilateral Lower Extremities Flexion Produces Pain into Bilateral Patella's Arises from table slowly using cane for support Antalgic Gait   Skin:    General: Skin is warm and dry.  Neurological:     Mental Status: She is alert and oriented to person, place, and time.  Psychiatric:        Mood and Affect: Mood normal.        Behavior: Behavior normal.            Assessment & Plan:  1.Chronic Bilateral Thoracic Back Pain/ Low Back Pain/ Lumbar Facet Arthropathy:RefilledMSIR 15mg  one tablet every 6 hoursas needed #120and MS Contin100 mg every 12 hours #60.Continue Gabapentin and Pamelor. 02/05/2018 We will continue the opioid monitoring program, this consists of regular clinic visits, examinations, urine drug screen, pill counts as well as use of [place] Controlled Substance Reporting System. 2. Degenerative Disk Disease:Encouraged Continue HEP as Tolertatedand heat therapy. Continue Current Medication Regime.02/05/2018 3. Bilateral Greater Trochanteric Bursitis: Continuecurrent treatment regimenwith Heat and Ice Therapy.02/05/2018 4. Osteoarthritis of Bilateral Knees: Continue current treatment modality with homeexerciseprogramand heat therapy. 02/05/2018 5. Migraine Headaches:Continue with  Migraine Journal.ContinueLamictalandMaxalt. Continue to Monitor.02/05/2018 6. Smoking Cessation: She has quit smokingsince 08/25/2016. Continue to Monitor.02/05/2018 7.Constipation: ContinueCurrent medication regime withLinzess.02/05/2018 8. Muscle Spasm: Continuecurrent medication regime withTizanidine.12/19/2017 9. DepressionAnxiety/ Panic Attacks: Continuecurrent medication regimen and treatment modality.Celexa.Lamictal and Counseling with [name] and  Dr.Akhtart Psychiatrist. 02/05/2018 10. RSD: Continue with current medication Regimewith Gabapentin.02/05/2018  20 minutes of face to face patient care time was spent during this visit. All questions were encouraged and answered.   F/U in45month

## 2018-02-07 ENCOUNTER — Other Ambulatory Visit: Payer: Self-pay | Admitting: Physical Medicine & Rehabilitation

## 2018-02-07 DIAGNOSIS — Z85850 Personal history of malignant neoplasm of thyroid: Secondary | ICD-10-CM | POA: Diagnosis not present

## 2018-02-07 DIAGNOSIS — Z87891 Personal history of nicotine dependence: Secondary | ICD-10-CM | POA: Diagnosis not present

## 2018-02-07 DIAGNOSIS — Z96652 Presence of left artificial knee joint: Secondary | ICD-10-CM | POA: Diagnosis not present

## 2018-02-07 DIAGNOSIS — Z85828 Personal history of other malignant neoplasm of skin: Secondary | ICD-10-CM | POA: Diagnosis not present

## 2018-02-07 DIAGNOSIS — G8929 Other chronic pain: Secondary | ICD-10-CM | POA: Diagnosis not present

## 2018-02-07 DIAGNOSIS — A4159 Other Gram-negative sepsis: Secondary | ICD-10-CM | POA: Diagnosis not present

## 2018-02-07 DIAGNOSIS — E079 Disorder of thyroid, unspecified: Secondary | ICD-10-CM | POA: Diagnosis not present

## 2018-02-07 DIAGNOSIS — Z9181 History of falling: Secondary | ICD-10-CM | POA: Diagnosis not present

## 2018-02-07 DIAGNOSIS — B961 Klebsiella pneumoniae [K. pneumoniae] as the cause of diseases classified elsewhere: Secondary | ICD-10-CM | POA: Diagnosis not present

## 2018-02-07 DIAGNOSIS — N3000 Acute cystitis without hematuria: Secondary | ICD-10-CM | POA: Diagnosis not present

## 2018-02-13 DIAGNOSIS — N3000 Acute cystitis without hematuria: Secondary | ICD-10-CM | POA: Diagnosis not present

## 2018-02-13 DIAGNOSIS — E079 Disorder of thyroid, unspecified: Secondary | ICD-10-CM | POA: Diagnosis not present

## 2018-02-13 DIAGNOSIS — Z9181 History of falling: Secondary | ICD-10-CM | POA: Diagnosis not present

## 2018-02-13 DIAGNOSIS — Z87891 Personal history of nicotine dependence: Secondary | ICD-10-CM | POA: Diagnosis not present

## 2018-02-13 DIAGNOSIS — Z85828 Personal history of other malignant neoplasm of skin: Secondary | ICD-10-CM | POA: Diagnosis not present

## 2018-02-13 DIAGNOSIS — Z96652 Presence of left artificial knee joint: Secondary | ICD-10-CM | POA: Diagnosis not present

## 2018-02-13 DIAGNOSIS — A4159 Other Gram-negative sepsis: Secondary | ICD-10-CM | POA: Diagnosis not present

## 2018-02-13 DIAGNOSIS — B961 Klebsiella pneumoniae [K. pneumoniae] as the cause of diseases classified elsewhere: Secondary | ICD-10-CM | POA: Diagnosis not present

## 2018-02-13 DIAGNOSIS — Z85850 Personal history of malignant neoplasm of thyroid: Secondary | ICD-10-CM | POA: Diagnosis not present

## 2018-02-13 DIAGNOSIS — G8929 Other chronic pain: Secondary | ICD-10-CM | POA: Diagnosis not present

## 2018-02-18 ENCOUNTER — Ambulatory Visit (HOSPITAL_COMMUNITY): Payer: Medicare Other | Admitting: Licensed Clinical Social Worker

## 2018-02-18 ENCOUNTER — Ambulatory Visit (INDEPENDENT_AMBULATORY_CARE_PROVIDER_SITE_OTHER): Payer: Medicare Other | Admitting: Licensed Clinical Social Worker

## 2018-02-18 DIAGNOSIS — F331 Major depressive disorder, recurrent, moderate: Secondary | ICD-10-CM

## 2018-02-18 DIAGNOSIS — M5136 Other intervertebral disc degeneration, lumbar region: Secondary | ICD-10-CM | POA: Diagnosis not present

## 2018-02-18 DIAGNOSIS — G894 Chronic pain syndrome: Secondary | ICD-10-CM

## 2018-02-18 DIAGNOSIS — M51369 Other intervertebral disc degeneration, lumbar region without mention of lumbar back pain or lower extremity pain: Secondary | ICD-10-CM

## 2018-02-18 NOTE — Progress Notes (Signed)
   THERAPIST PROGRESS NOTE  Session Time: 10:02 AM to 10:54 AM  Participation Level: Active  Behavioral Response: CasualAlertEuthymic  Type of Therapy: Individual Therapy  Treatment Goals addressed: Decrease in depression, anxiety and panic  Interventions: Solution Focused, Strength-based, Supportive and Other: coping  Summary: [name] is a 58 y.o. female who presents with having been in the hospital recently. Relates that she was diagnosed with bladder infection, was septic but caught early. During session therapist pointed out things patient said to put herself down such as describing herself as fat.  Therapist pointed out the need to talk to herself differently, appreciate herself and to work on this in treatment.  Patient did not act positively related to ability to change that therapist pointed out at work in therapy can have a positive impact on self-esteem.  Patient shared more about memories from the past that include positive memories with dad as he was a Animator for flower shops, described positive memories of dad and how being around flowers led to her becoming skilled with flower arrangements.  Identified as well patient strength as a Scientist, research (physical sciences). Shared positive memories of going to [facility] as a child and these memories appeared to [employer].  Discussed as well difficulties with mom, believes she was manic depressant and this impacted her growing up, was not treated the same, worse than other siblings got worse as she got older.  Reviewed mental health symptoms and patient relates barely manageable because of the pain.  Reviewed positive strategies that help her and they include coloring in her coloring book and emotional support dog, patron.  Reviewed history for patient that identified her as hard worker, working since she was young, working at 2 jobs.  Reviewed treatment plan and patient identified therapist as main support.  Discussed stressors of daughters being  further away and 1 daughter, DD, will talk to her and does not know why.  Therapist assess patient current functioning per report and processed feelings related to stressors and discussed coping for stressors.  Utilized cognitive and reframing strategies to help change negative feelings toward self, negative feelings about current situation.  Assess mood symptoms are related to limitations in patient's life, continue to work with patient on coping strategies to help improve mood.  Utilize strength based on supportive intervention and completed treatment plan.  Discussed memories from past both in helping patient to work through them and also as a mood enhancing strategy as patient discusses positive memories and relationship from the past. Suicidal/Homicidal: No  Plan: Return again in 2 weeks.2.  Therapist will continue to work with patient on coping strategies to decrease anxiety, depression and panic  Diagnosis: Axis I:  major depressive disorder, recurrent, moderate, degenerative disc disease, chronic pain syndrome, generalized anxiety disorder    Axis II: No diagnosis    [name], LCSW 02/18/2018

## 2018-02-19 DIAGNOSIS — R1310 Dysphagia, unspecified: Secondary | ICD-10-CM | POA: Diagnosis not present

## 2018-02-19 DIAGNOSIS — K319 Disease of stomach and duodenum, unspecified: Secondary | ICD-10-CM | POA: Diagnosis not present

## 2018-02-19 DIAGNOSIS — K219 Gastro-esophageal reflux disease without esophagitis: Secondary | ICD-10-CM | POA: Diagnosis not present

## 2018-02-22 ENCOUNTER — Other Ambulatory Visit: Payer: Self-pay | Admitting: Physical Medicine & Rehabilitation

## 2018-02-25 ENCOUNTER — Ambulatory Visit (HOSPITAL_COMMUNITY): Payer: Medicare Other | Admitting: Licensed Clinical Social Worker

## 2018-03-03 DIAGNOSIS — Z886 Allergy status to analgesic agent status: Secondary | ICD-10-CM | POA: Diagnosis not present

## 2018-03-03 DIAGNOSIS — E079 Disorder of thyroid, unspecified: Secondary | ICD-10-CM | POA: Diagnosis not present

## 2018-03-03 DIAGNOSIS — Z87891 Personal history of nicotine dependence: Secondary | ICD-10-CM | POA: Diagnosis not present

## 2018-03-03 DIAGNOSIS — R509 Fever, unspecified: Secondary | ICD-10-CM | POA: Diagnosis not present

## 2018-03-03 DIAGNOSIS — R197 Diarrhea, unspecified: Secondary | ICD-10-CM | POA: Diagnosis not present

## 2018-03-03 DIAGNOSIS — Z85850 Personal history of malignant neoplasm of thyroid: Secondary | ICD-10-CM | POA: Diagnosis not present

## 2018-03-03 DIAGNOSIS — R110 Nausea: Secondary | ICD-10-CM | POA: Diagnosis not present

## 2018-03-03 DIAGNOSIS — S0083XA Contusion of other part of head, initial encounter: Secondary | ICD-10-CM | POA: Diagnosis not present

## 2018-03-03 DIAGNOSIS — M7731 Calcaneal spur, right foot: Secondary | ICD-10-CM | POA: Diagnosis not present

## 2018-03-03 DIAGNOSIS — R918 Other nonspecific abnormal finding of lung field: Secondary | ICD-10-CM | POA: Diagnosis not present

## 2018-03-03 DIAGNOSIS — S0511XA Contusion of eyeball and orbital tissues, right eye, initial encounter: Secondary | ICD-10-CM | POA: Diagnosis not present

## 2018-03-03 DIAGNOSIS — S0591XA Unspecified injury of right eye and orbit, initial encounter: Secondary | ICD-10-CM | POA: Diagnosis not present

## 2018-03-03 DIAGNOSIS — R55 Syncope and collapse: Secondary | ICD-10-CM | POA: Diagnosis not present

## 2018-03-03 DIAGNOSIS — Z79899 Other long term (current) drug therapy: Secondary | ICD-10-CM | POA: Diagnosis not present

## 2018-03-03 DIAGNOSIS — R269 Unspecified abnormalities of gait and mobility: Secondary | ICD-10-CM | POA: Diagnosis not present

## 2018-03-03 DIAGNOSIS — M79674 Pain in right toe(s): Secondary | ICD-10-CM | POA: Diagnosis not present

## 2018-03-03 DIAGNOSIS — N3000 Acute cystitis without hematuria: Secondary | ICD-10-CM | POA: Diagnosis not present

## 2018-03-03 DIAGNOSIS — Z888 Allergy status to other drugs, medicaments and biological substances status: Secondary | ICD-10-CM | POA: Diagnosis not present

## 2018-03-03 DIAGNOSIS — S90121A Contusion of right lesser toe(s) without damage to nail, initial encounter: Secondary | ICD-10-CM | POA: Diagnosis not present

## 2018-03-03 DIAGNOSIS — Z85828 Personal history of other malignant neoplasm of skin: Secondary | ICD-10-CM | POA: Diagnosis not present

## 2018-03-03 DIAGNOSIS — E876 Hypokalemia: Secondary | ICD-10-CM | POA: Diagnosis not present

## 2018-03-05 ENCOUNTER — Encounter: Payer: Medicare Other | Attending: Physical Medicine and Rehabilitation | Admitting: Registered Nurse

## 2018-03-05 ENCOUNTER — Encounter: Payer: Self-pay | Admitting: Registered Nurse

## 2018-03-05 VITALS — BP 112/70 | HR 88 | Ht 67.0 in | Wt 234.0 lb

## 2018-03-05 DIAGNOSIS — G43119 Migraine with aura, intractable, without status migrainosus: Secondary | ICD-10-CM

## 2018-03-05 DIAGNOSIS — F329 Major depressive disorder, single episode, unspecified: Secondary | ICD-10-CM

## 2018-03-05 DIAGNOSIS — M51369 Other intervertebral disc degeneration, lumbar region without mention of lumbar back pain or lower extremity pain: Secondary | ICD-10-CM

## 2018-03-05 DIAGNOSIS — G894 Chronic pain syndrome: Secondary | ICD-10-CM

## 2018-03-05 DIAGNOSIS — M1711 Unilateral primary osteoarthritis, right knee: Secondary | ICD-10-CM

## 2018-03-05 DIAGNOSIS — Z760 Encounter for issue of repeat prescription: Secondary | ICD-10-CM | POA: Diagnosis not present

## 2018-03-05 DIAGNOSIS — G8929 Other chronic pain: Secondary | ICD-10-CM

## 2018-03-05 DIAGNOSIS — M7062 Trochanteric bursitis, left hip: Secondary | ICD-10-CM

## 2018-03-05 DIAGNOSIS — M5136 Other intervertebral disc degeneration, lumbar region: Secondary | ICD-10-CM | POA: Diagnosis not present

## 2018-03-05 DIAGNOSIS — Z79899 Other long term (current) drug therapy: Secondary | ICD-10-CM

## 2018-03-05 DIAGNOSIS — M546 Pain in thoracic spine: Secondary | ICD-10-CM

## 2018-03-05 DIAGNOSIS — M7061 Trochanteric bursitis, right hip: Secondary | ICD-10-CM

## 2018-03-05 DIAGNOSIS — Z5181 Encounter for therapeutic drug level monitoring: Secondary | ICD-10-CM

## 2018-03-05 DIAGNOSIS — M1712 Unilateral primary osteoarthritis, left knee: Secondary | ICD-10-CM

## 2018-03-05 DIAGNOSIS — G9050 Complex regional pain syndrome I, unspecified: Secondary | ICD-10-CM

## 2018-03-05 DIAGNOSIS — M47816 Spondylosis without myelopathy or radiculopathy, lumbar region: Secondary | ICD-10-CM | POA: Diagnosis not present

## 2018-03-05 DIAGNOSIS — M7918 Myalgia, other site: Secondary | ICD-10-CM

## 2018-03-05 DIAGNOSIS — R296 Repeated falls: Secondary | ICD-10-CM

## 2018-03-05 MED ORDER — MORPHINE SULFATE 15 MG PO TABS: 15.0000 mg | ORAL_TABLET | Freq: Four times a day (QID) | ORAL | 0 refills | Status: DC | PRN

## 2018-03-05 MED ORDER — MORPHINE SULFATE ER 100 MG PO TBCR: 100.0000 mg | EXTENDED_RELEASE_TABLET | Freq: Two times a day (BID) | ORAL | 0 refills | Status: DC

## 2018-03-05 NOTE — Progress Notes (Signed)
Subjective:     Patient ID: [name], female   DOB: 04-29-1960, 58 y.o.   MRN: [id number]  HPI: [name] is a 58 y.o. female who returns for follow up appointment for chronic pain and medication refill. She states her pain is located in her mid- lower back radiating into her left lower extremity, bilateral hip pain and bilateral knee pain R>L. She rates her pain 9. Her current exercise regime is walking.   [name] reports on 01/25 2020 she had fallen twice, once she was walking into the bathroom and lost her footing. She reports she hit her right eye on the bath tub. They called the Fire Department who picked her up she reports. She didn't go to hospital for evaluation. On 03/03/2018 she states she was in her bedroom and fell backwards, EMS was called she was transfered to [facility] ED. Note was reviewed.  She was diagnosed with Syncope, Acute Cystitis, Contusion and Closed Head Injury.    [name] Morphine equivalent is 260.00  MME.  Last UDs was Performed on 11/19/2017, it was consistent.    Pain Inventory Average Pain 9 Pain Right Now 9 My pain is constant, sharp, burning, dull, stabbing, tingling and aching  In the last 24 hours, has pain interfered with the following? General activity 9 Relation with others 9 Enjoyment of life 9 What TIME of day is your pain at its worst? all Sleep (in general) Poor  Pain is worse with: walking, bending, standing and some activites Pain improves with: rest, heat/ice, medication, TENS and injections Relief from Meds: 6  Mobility walk with assistance use a cane use a walker ability to climb steps?  yes do you drive?  yes Do you have any goals in this area?  yes  Function not employed: date last employed . disabled: date disabled . I need assistance with the following:  meal prep, household duties and shopping Do you have any goals in this area?  yes  Neuro/Psych bladder control  problems weakness numbness tremor tingling trouble walking spasms dizziness confusion depression anxiety  Prior Studies Any changes since last visit?  no  Physicians involved in your care Any changes since last visit?  no   Family History  Problem Relation Age of Onset  . Depression Mother   . Hypertension Mother   . Hypertension Father   . Heart failure Father   . Diabetes Father   . Heart attack Father   . Colon cancer Neg Hx   . Esophageal cancer Neg Hx   . Stomach cancer Neg Hx   . Rectal cancer Neg Hx    Social History   Socioeconomic History  . Marital status: Married    Spouse name: [name]  . Number of children: 1  . Years of education: some college  . Highest education level: Not on file  Occupational History  . Occupation: On disability    Employer: UNEMPLOYED  Social Needs  . Financial resource strain: Not on file  . Food insecurity:    Worry: Not on file    Inability: Not on file  . Transportation needs:    Medical: Not on file    Non-medical: Not on file  Tobacco Use  . Smoking status: Former Smoker    Packs/day: 0.50    Types: Cigarettes    Last attempt to quit: 02/06/2016    Years since quitting: 2.0  . Smokeless tobacco: Never Used  . Tobacco comment: going to try e-sticks  to quit  Substance and Sexual Activity  . Alcohol use: No    Alcohol/week: 0.0 standard drinks  . Drug use: No  . Sexual activity: Not Currently    Partners: Male  Lifestyle  . Physical activity:    Days per week: Not on file    Minutes per session: Not on file  . Stress: Not on file  Relationships  . Social connections:    Talks on phone: Not on file    Gets together: Not on file    Attends religious service: Not on file    Active member of club or organization: Not on file    Attends meetings of clubs or organizations: Not on file    Relationship status: Not on file  Other Topics Concern  . Not on file  Social History Narrative   8-10 cups of soda a  day.  On disability. No regular exercise.     Past Surgical History:  Procedure Laterality Date  . ABDOMINAL HYSTERECTOMY  May 2004  . Basel Cell Carcinoma  06/2005, 07/2005   nasal tip and reconstruction  . BREAST CYST ASPIRATION    . CARPAL TUNNEL RELEASE  07/1993   both hands  . COLONOSCOPY    . HEMORROIDECTOMY  July 2003  . KNEE ARTHROPLASTY  09/2001, 05/2003   left knee, chondromalasia  . KNEE ARTHROSCOPY  jan 2005   Right knee, tisse release, chrondromalacia  . REPLACEMENT TOTAL KNEE  Nov 2005   Left   . THYROID LOBECTOMY  01/2005   malignant area removed from right lobe  . THYROID SURGERY     thyroid lobe removal  . TONSILLECTOMY  09/1972  . TUBAL LIGATION  Sept 1999  . Ulnar nerve entrapment  Feb 1995   left elbow   Past Medical History:  Diagnosis Date  . Allergy   . Anxiety   . Cancer ([place])    cancerous nodules on thyroid  . Cancer ([place])    basal cell Cancer-nose  . Chronic pain syndrome   . COPD (chronic obstructive pulmonary disease) ([place])   . Degeneration of lumbar or lumbosacral intervertebral disc   . Depression   . Facet syndrome, lumbar   . GERD (gastroesophageal reflux disease)   . Hyperlipidemia   . Intervertebral lumbar disc disorder with myelopathy, lumbar region   . Lumbosacral spondylosis without myelopathy   . Migraine without aura, with intractable migraine, so stated, without mention of status migrainosus   . Primary localized osteoarthrosis, lower leg   . Thyroid disease    hypothyroid  . Unspecified musculoskeletal disorders and symptoms referable to neck    cervical/trapezius   Ht 5\' 7"  (1.702 m)   Wt 234 lb (106.1 kg)   BMI 36.65 kg/m   Opioid Risk Score:   Fall Risk Score:  `1  Depression screen PHQ 2/9  Depression screen [facility] 2/9 01/28/2018 01/28/2018 10/11/2017 10/25/2016 08/25/2016 07/14/2016 11/10/2015  Decreased Interest 3 3 3 3 3 3 3   Down, Depressed, Hopeless 3 3 3 3 3 3 3   PHQ - 2 Score 6 6 6 6 6 6 6   Altered sleeping 3 3 3  - -  - -  Tired, decreased energy 3 3 3  - - - -  Change in appetite 3 3 2  - - - -  Feeling bad or failure about yourself  3 3 3  - - - -  Trouble concentrating 1 1 3  - - - -  Moving slowly or fidgety/restless 0 0 0 - - - -  Suicidal thoughts 2 2 0 - - - -  PHQ-9 Score 21 21 20  - - - -  Difficult doing work/chores Extremely dIfficult Extremely dIfficult Extremely dIfficult - - - -  Some encounter information is confidential and restricted. Go to Review Flowsheets activity to see all data.  Some recent data might be hidden   Review of Systems  Constitutional: Positive for chills, diaphoresis and fever.  HENT: Negative.   Eyes: Negative.   Respiratory: Positive for cough.   Cardiovascular: Positive for leg swelling.  Gastrointestinal: Negative.   Endocrine: Negative.   Genitourinary: Positive for difficulty urinating.  Musculoskeletal: Positive for arthralgias, back pain, gait problem and neck pain.       Spasms   Skin: Negative.   Allergic/Immunologic: Negative.   Neurological: Positive for dizziness, tremors, weakness and numbness.       Tingling  Psychiatric/Behavioral: Positive for confusion and dysphoric mood. The patient is nervous/anxious.   All other systems reviewed and are negative.      Objective:   Physical Exam Vitals signs and nursing note reviewed.  Constitutional:      Appearance: Normal appearance.  Neck:     Musculoskeletal: Normal range of motion and neck supple.  Cardiovascular:     Rate and Rhythm: Normal rate and regular rhythm.     Pulses: Normal pulses.     Heart sounds: Normal heart sounds.  Pulmonary:     Effort: Pulmonary effort is normal.     Breath sounds: Normal breath sounds.  Musculoskeletal:     Comments: Normal Muscle Bulk and Muscle Testing Reveals:  Upper Extremities: Full ROM and Muscle Strength 5/5 Thoracic Paraspinal Tenderness: T-7-T-9 Lumbar Hypersensitivity Lower Extremities: Decreased ROM ad Muscle Strength 5/5 Bilateral Lower  Extremities Flexion Produces Pain into Bilateral Patella's Arises from Table Slowly using walker for support Antalgic Gait   Skin:    General: Skin is warm and dry.  Neurological:     Mental Status: She is alert and oriented to person, place, and time.  Psychiatric:        Mood and Affect: Mood normal.        Behavior: Behavior normal.        Assessment:Plan:  1.Chronic Bilateral Thoracic Back Pain/ Low Back Pain/ Lumbar Facet Arthropathy:RefilledMSIR 15mg  one tablet every 6 hoursas needed #120and MS Contin100 mg every 12 hours #60.Continue Gabapentin and Pamelor.03/05/2018 We will continue the opioid monitoring program, this consists of regular clinic visits, examinations, urine drug screen, pill counts as well as use of [place] Controlled Substance Reporting System. 2. Degenerative Disk Disease:Encouraged Continue HEP as Tolertatedand heat therapy. Continue Current Medication Regime.03/05/2018. 3. Bilateral Greater Trochanteric Bursitis: Continuecurrent treatment regimenwith Heat and Ice Therapy.03/05/2018. 4. Osteoarthritis of Bilateral Knees: Continue current treatment modality with homeexerciseprogramand heat therapy. 03/05/2018. 5. Migraine Headaches:Continue with  Migraine Journal.ContinueLamictalandMaxalt. Continue to Monitor.03/05/2018 6. Smoking Cessation: She has quit smokingsince 08/25/2016. Continue to Monitor.01281/2020 7.Constipation: ContinueCurrent medication regime withLinzess.03/05/2018 8. Muscle Spasm: Continuecurrent medication regime withTizanidine.03/05/2018 9. DepressionAnxiety/ Panic Attacks: Continuecurrent medication regimen and treatment modality.Celexa.Lamictaland Counseling with [name] and  Dr.Akhtart Psychiatrist. 03/05/2018 10. RSD: Continue with current medication Regimewith Gabapentin.03/05/2018 11. Multiple Falls: Educated on [facility]. She verbalizes understanding.   30 minutes of face to face  patient care time was spent during this visit. All questions were encouraged and answered.   F/U in70month

## 2018-03-09 ENCOUNTER — Other Ambulatory Visit: Payer: Self-pay | Admitting: Registered Nurse

## 2018-03-09 ENCOUNTER — Other Ambulatory Visit: Payer: Self-pay | Admitting: Family Medicine

## 2018-03-09 ENCOUNTER — Other Ambulatory Visit: Payer: Self-pay | Admitting: Physical Medicine & Rehabilitation

## 2018-03-11 ENCOUNTER — Ambulatory Visit (INDEPENDENT_AMBULATORY_CARE_PROVIDER_SITE_OTHER): Payer: Medicare Other | Admitting: Licensed Clinical Social Worker

## 2018-03-11 DIAGNOSIS — F331 Major depressive disorder, recurrent, moderate: Secondary | ICD-10-CM | POA: Diagnosis not present

## 2018-03-11 DIAGNOSIS — M51369 Other intervertebral disc degeneration, lumbar region without mention of lumbar back pain or lower extremity pain: Secondary | ICD-10-CM

## 2018-03-11 DIAGNOSIS — F411 Generalized anxiety disorder: Secondary | ICD-10-CM

## 2018-03-11 DIAGNOSIS — M5136 Other intervertebral disc degeneration, lumbar region: Secondary | ICD-10-CM | POA: Diagnosis not present

## 2018-03-11 DIAGNOSIS — G894 Chronic pain syndrome: Secondary | ICD-10-CM | POA: Diagnosis not present

## 2018-03-11 NOTE — Progress Notes (Signed)
THERAPIST PROGRESS NOTE  Session Time: 8:03 AM to 8:55 AM  Participation Level: Active  Behavioral Response: CasualAlertappropriate  Type of Therapy: Individual Therapy  Treatment Goals addressed:  Decrease in depression, anxiety and panic   Interventions: Solution Focused, Strength-based, Anger Management Training and Other: coping, strategies to decrease anxiety   Summary: [name] is a 58 y.o. female who presents with Sunday at ER fell Saturday. She went into the hospital because running a fever. Has not had falls until last 6 months when she had 4.  Therapist explored ways for patient to get out that would help with mood, also address pain issues such as swimming, alternative strategies for pain management such as acupuncture, healing touch spirituality, meditation, tissue therapies.  Relates that 99% of time she is depressed and relates this to pain but also feels comfortable at house and not motivation to go out, "fine at the house".  Relates that if she stays on target with treatment that things are "tolerable" that pain never goes away, enjoys going to the pool, "could live in the pool" but not motivated by herself and husband has no interest in going.  Shares 1 of positive things in her life is her puppy and relates "can't be unhappy when he is in the room with her".  Shared history that everyone they were close to has moved leaves her and has been isolated.  Will has issue of her older daughter DD not talking to her since last April.  Daughter has been in therapy for last year and a half but does not communicate with her so patient does not know what is wrong to be able to fix it.  Reviewed patient's symptoms of panic, she relates a couple times a week cannot identify a trigger, relates has had panic for last couple of years but more anxiety than panic.  Reviewed strategies for pain and patient reviewed information she learned from [place] specialist including that Tumeric is  good for pain, any fruit that don't have to get out of skin such as  blueberries, blackberries, strawberries, also veggies, lots of greens.  Good session and patient relates most helpful thing is to be discussing various aspects of her life, identifying and releasing emotions. Therapist assessed patient current functioning per report.  Assess feelings related to current stressors and discussed coping with stressors.  Focused on strategies to help with depression, encourage patient to engage in activities that would help with pain such as getting into the water, alternative strategies for pain choose acupuncture.  Says patient is limited in mobility and motivation to do these activities.  Discussed patient's interaction in therapy as we discussed different issues is helpful for mood.  Explained that when we engage in activities that are purposeful, that we enjoy and or social helps enhance mood and therapy as a avenue to offer t opportunity for interacting and helping patient get out of the home.  Reviewed helpful strategies for pain as noted connection between depression and pain, reviewed certain foods that could help with pain.  Validated patient on how she was feeling related to lack of communication with daughter and shared in order to resolve issues people need to give you opportunity to share what is wrong you can work on it.  Provided supportive and strength-based intervention  Suicidal/Homicidal: No  Plan: Return again in 2 weeks.2.  Patient review handout on anxiety to help her and learning coping strategies for anxiety and panic.  Disease review with therapist next  session) 3.  Therapist work with patient on learning effective strategies to manage and decrease anxiety depression and panic  Diagnosis: Axis I:  major depressive disorder, recurrent, moderate, degenerative disc disease, chronic pain syndrome, generalized anxiety disorder    Axis II: No diagnosis    [name], LCSW 03/11/2018

## 2018-03-18 ENCOUNTER — Ambulatory Visit (INDEPENDENT_AMBULATORY_CARE_PROVIDER_SITE_OTHER): Payer: Medicare Other | Admitting: Psychiatry

## 2018-03-18 ENCOUNTER — Encounter (HOSPITAL_COMMUNITY): Payer: Self-pay | Admitting: Psychiatry

## 2018-03-18 VITALS — BP 130/76 | HR 95 | Ht 67.0 in | Wt 236.6 lb

## 2018-03-18 DIAGNOSIS — F411 Generalized anxiety disorder: Secondary | ICD-10-CM

## 2018-03-18 DIAGNOSIS — F331 Major depressive disorder, recurrent, moderate: Secondary | ICD-10-CM

## 2018-03-18 DIAGNOSIS — G894 Chronic pain syndrome: Secondary | ICD-10-CM

## 2018-03-18 NOTE — Progress Notes (Signed)
[facility] Outpatient Follow up visit  Patient Identification: [name] MRN:  [id number] Date of Evaluation:  03/18/2018 Referral Source: primary care Chief Complaint:   Chief Complaint    Follow-up     Visit Diagnosis:    ICD-10-CM   1. Major depressive disorder, recurrent episode, moderate (HCC) F33.1   2. Chronic pain syndrome G89.4   3. GAD (generalized anxiety disorder) F41.1     History of Present Illness:  58 years old white married female. Referred by pain clinic for counselling and depression  Patient suffers from chronic back condition multiple pain conditions including at the joints surgery in the past including knee replacement.  She takes morphine 4 times a day also on gabapentin.  She takes Lamictal for migraine referred for possible counseling  In general pain affects mood and because of depression anxiety at times Husband is supportive celexa helps depression Pain main factor for limitations  Tries to move around at home Concern granddaughter moving to [place] medications.  No rash Duration : more then 20 years  Aggravating F:multiple medical conditions , arthirits, Degenerative joints disease. Chronic back condition, migraine Limited movements   Past Psychiatric History: depression, anxiety, chronic pain  Previous Psychotropic Medications: Yes   Substance Abuse History in the last 12 months:  No.  On pain meds as prescribed by provider for arthritis  Past Medical History:  Past Medical History:  Diagnosis Date  . Allergy   . Anxiety   . Cancer ([place])    cancerous nodules on thyroid  . Cancer ([place])    basal cell Cancer-nose  . Chronic pain syndrome   . COPD (chronic obstructive pulmonary disease) ([place])   . Degeneration of lumbar or lumbosacral intervertebral disc   . Depression   . Facet syndrome, lumbar   . GERD (gastroesophageal reflux disease)   . Hyperlipidemia   . Intervertebral lumbar disc disorder with myelopathy,  lumbar region   . Lumbosacral spondylosis without myelopathy   . Migraine without aura, with intractable migraine, so stated, without mention of status migrainosus   . Primary localized osteoarthrosis, lower leg   . Thyroid disease    hypothyroid  . Unspecified musculoskeletal disorders and symptoms referable to neck    cervical/trapezius    Past Surgical History:  Procedure Laterality Date  . ABDOMINAL HYSTERECTOMY  May 2004  . Basel Cell Carcinoma  06/2005, 07/2005   nasal tip and reconstruction  . BREAST CYST ASPIRATION    . CARPAL TUNNEL RELEASE  07/1993   both hands  . COLONOSCOPY    . HEMORROIDECTOMY  July 2003  . KNEE ARTHROPLASTY  09/2001, 05/2003   left knee, chondromalasia  . KNEE ARTHROSCOPY  jan 2005   Right knee, tisse release, chrondromalacia  . REPLACEMENT TOTAL KNEE  Nov 2005   Left   . THYROID LOBECTOMY  01/2005   malignant area removed from right lobe  . THYROID SURGERY     thyroid lobe removal  . TONSILLECTOMY  09/1972  . TUBAL LIGATION  Sept 1999  . Ulnar nerve entrapment  Feb 1995   left elbow    Family Psychiatric History: mom : depression  Family History:  Family History  Problem Relation Age of Onset  . Depression Mother   . Hypertension Mother   . Hypertension Father   . Heart failure Father   . Diabetes Father   . Heart attack Father   . Colon cancer Neg Hx   . Esophageal cancer Neg Hx   .  Stomach cancer Neg Hx   . Rectal cancer Neg Hx     Social History:   Social History   Socioeconomic History  . Marital status: Married    Spouse name: [name]  . Number of children: 1  . Years of education: some college  . Highest education level: Not on file  Occupational History  . Occupation: On disability    Employer: UNEMPLOYED  Social Needs  . Financial resource strain: Not on file  . Food insecurity:    Worry: Not on file    Inability: Not on file  . Transportation needs:    Medical: Not on file    Non-medical: Not on file  Tobacco  Use  . Smoking status: Former Smoker    Packs/day: 0.50    Types: Cigarettes    Last attempt to quit: 02/06/2016    Years since quitting: 2.1  . Smokeless tobacco: Never Used  . Tobacco comment: going to try e-sticks to quit  Substance and Sexual Activity  . Alcohol use: No    Alcohol/week: 0.0 standard drinks  . Drug use: No  . Sexual activity: Not Currently    Partners: Male  Lifestyle  . Physical activity:    Days per week: Not on file    Minutes per session: Not on file  . Stress: Not on file  Relationships  . Social connections:    Talks on phone: Not on file    Gets together: Not on file    Attends religious service: Not on file    Active member of club or organization: Not on file    Attends meetings of clubs or organizations: Not on file    Relationship status: Not on file  Other Topics Concern  . Not on file  Social History Narrative   8-10 cups of soda a day.  On disability. No regular exercise.        Allergies:   Allergies  Allergen Reactions  . Aspirin Other (See Comments)    Abdominal bleeding   . Ibuprofen Swelling  . Imitrex [Sumatriptan Base] Other (See Comments)    Drops BP   . Norvasc [Amlodipine Besylate] Swelling  . Nsaids Swelling  . Tape     Skin blisters under surgical tape  . Topamax [Topiramate] Other (See Comments)    Hair loss    Metabolic Disorder Labs: Lab Results  Component Value Date   HGBA1C 6.1 (H) 10/11/2017   MPG 128 10/11/2017   MPG 131 05/26/2015   No results found for: PROLACTIN Lab Results  Component Value Date   CHOL 131 10/11/2017   TRIG 162 (H) 10/11/2017   HDL 47 (L) 10/11/2017   CHOLHDL 2.8 10/11/2017   VLDL 46 (H) 05/26/2015   LDLCALC 60 10/11/2017   LDLCALC 67 05/26/2015     Current Medications: Current Outpatient Medications  Medication Sig Dispense Refill  . citalopram (CELEXA) 40 MG tablet TAKE 1 TABLET(40 MG) BY MOUTH AT BEDTIME 30 tablet 5  . gabapentin (NEURONTIN) 800 MG tablet TAKE 1  TABLET(800 MG) BY MOUTH FOUR TIMES DAILY 120 tablet 5  . lamoTRIgine (LAMICTAL) 25 MG tablet TAKE 1 TABLET(25 MG) BY MOUTH DAILY 30 tablet 2  . levothyroxine (SYNTHROID, LEVOTHROID) 25 MCG tablet TAKE 1 TABLET(25 MCG) BY MOUTH DAILY BEFORE BREAKFAST 90 tablet 1  . LINZESS 145 MCG CAPS capsule TAKE 1 CAPSULE BY MOUTH DAILY 30 capsule 3  . morphine (MS CONTIN) 100 MG 12 hr tablet Take 1 tablet (100 mg total)  by mouth every 12 (twelve) hours. 60 tablet 0  . morphine (MSIR) 15 MG tablet Take 1 tablet (15 mg total) by mouth every 6 (six) hours as needed for severe pain. 120 tablet 0  . nortriptyline (PAMELOR) 50 MG capsule TAKE 2 CAPSULES BY MOUTH AT BEDTIME 180 capsule 0  . pravastatin (PRAVACHOL) 40 MG tablet TAKE 1 TABLET BY MOUTH DAILY 90 tablet 3  . promethazine (PHENERGAN) 12.5 MG tablet TAKE 1 TABLET(12.5 MG) BY MOUTH EVERY 12 HOURS 30 tablet 2  . rizatriptan (MAXALT) 10 MG tablet TAKE 1 TABLET BY MOUTH IF NEEDED. MAY REPEAT AFTER 2 HOURS IF NEEDED 30 tablet 2  . tiZANidine (ZANAFLEX) 2 MG tablet TAKE 1 TABLET BY MOUTH THREE TIMES DAILY AS NEEDED FOR MUSCLE SPASMS 90 tablet 5  . lactobacillus acidophilus & bulgar (LACTINEX) chewable tablet Chew 1 tablet by mouth 3 (three) times daily with meals.  0   No current facility-administered medications for this visit.      Psychiatric Specialty Exam: Review of Systems  Constitutional: Positive for malaise/fatigue.  Cardiovascular: Negative for palpitations.  Musculoskeletal: Positive for back pain, myalgias and neck pain.  Skin: Negative for rash.  Neurological: Negative for tremors.    Blood pressure 130/76, pulse 95, height 5\' 7"  (1.702 m), weight 236 lb 9.6 oz (107.3 kg), SpO2 97 %.Body mass index is 37.06 kg/m.  General Appearance: Casual  Eye Contact:  Fair  Speech:  Slow  Volume:  Normal  Mood: fair  Affect: constricted  Thought Process:  Goal Directed  Orientation:  Full (Time, Place, and Person)  Thought Content:  Rumination   Suicidal Thoughts:  No  Homicidal Thoughts:  No  Memory:  Immediate;   Fair Recent;   Fair  Judgement:  Fair  Insight:  Fair  Psychomotor Activity:  Decreased  Concentration:  Concentration: Fair and Attention Span: Fair  Recall:  [employer] of Knowledge:Good  Language: Good  Akathisia:  No  Handed:  Right  AIMS (if indicated):    Assets:  Desire for Improvement  ADL's:  Intact  Cognition: WNL  Sleep:  variable    Treatment Plan Summary: Medication management and Plan as follows  MDD, moderate recurrent;  Somewhat subdued. Continue celxa GADsporadic panic . Continue gabapentin, celexa  Supportive husband Has appointment with physical rehab  Chronic back pain and ARthritis: on meds by providers.  Continue supportive andCBT with therapist Has meds Fu 53m. Questions addressed    Merian Capron, MD 2/10/20208:40 AM

## 2018-03-27 ENCOUNTER — Ambulatory Visit (HOSPITAL_COMMUNITY): Payer: Medicare Other | Admitting: Licensed Clinical Social Worker

## 2018-04-02 ENCOUNTER — Other Ambulatory Visit: Payer: Self-pay | Admitting: Physical Medicine & Rehabilitation

## 2018-04-09 ENCOUNTER — Encounter: Payer: Medicare Other | Attending: Physical Medicine and Rehabilitation | Admitting: Physical Medicine & Rehabilitation

## 2018-04-09 ENCOUNTER — Encounter: Payer: Self-pay | Admitting: Physical Medicine & Rehabilitation

## 2018-04-09 DIAGNOSIS — G43119 Migraine with aura, intractable, without status migrainosus: Secondary | ICD-10-CM | POA: Diagnosis not present

## 2018-04-09 DIAGNOSIS — Z5181 Encounter for therapeutic drug level monitoring: Secondary | ICD-10-CM | POA: Diagnosis not present

## 2018-04-09 DIAGNOSIS — M1712 Unilateral primary osteoarthritis, left knee: Secondary | ICD-10-CM

## 2018-04-09 DIAGNOSIS — M1711 Unilateral primary osteoarthritis, right knee: Secondary | ICD-10-CM

## 2018-04-09 DIAGNOSIS — Z760 Encounter for issue of repeat prescription: Secondary | ICD-10-CM | POA: Insufficient documentation

## 2018-04-09 DIAGNOSIS — Z79899 Other long term (current) drug therapy: Secondary | ICD-10-CM

## 2018-04-09 DIAGNOSIS — G894 Chronic pain syndrome: Secondary | ICD-10-CM

## 2018-04-09 MED ORDER — MORPHINE SULFATE 15 MG PO TABS: 15.0000 mg | ORAL_TABLET | Freq: Four times a day (QID) | ORAL | 0 refills | Status: DC | PRN

## 2018-04-09 MED ORDER — ERENUMAB-AOOE 70 MG/ML ~~LOC~~ SOAJ: 70.0000 mg | SUBCUTANEOUS | 3 refills | Status: DC

## 2018-04-09 MED ORDER — MORPHINE SULFATE ER 100 MG PO TBCR: 100.0000 mg | EXTENDED_RELEASE_TABLET | Freq: Two times a day (BID) | ORAL | 0 refills | Status: DC

## 2018-04-09 NOTE — Patient Instructions (Signed)
PLEASE FEEL FREE TO CALL OUR OFFICE WITH ANY PROBLEMS OR QUESTIONS ([phone])      

## 2018-04-09 NOTE — Progress Notes (Signed)
Subjective:    Patient ID: KAMDYN COVEL, female    DOB: Jun 05, 1960, 58 y.o.   MRN: [id number]  HPI   [name] is here in follow up of her chronic pain. She continues to deal with pain in her back and knees. She has a support dog which has been helpful from an emotional standpoint.   She continues to have frequent migraine headaches which are often incapacitating. They can often last for several days. She has 12-17 headache days per month. Headaches are frequently frontal in location but often can involve occiput and crown of head, too  She remains on ms contin and ms ir for pain control with reasonable results for her knees and back. She is trying to stretch her ms ir out farther.   Pain Inventory Average Pain 9 Pain Right Now 8 My pain is constant, sharp, burning, dull, stabbing, tingling and aching  In the last 24 hours, has pain interfered with the following? General activity 10 Relation with others 10 Enjoyment of life 10 What TIME of day is your pain at its worst? na Sleep (in general) Poor  Pain is worse with: walking, bending, standing and some activites Pain improves with: rest, heat/ice, therapy/exercise, medication, TENS and injections Relief from Meds: 2  Mobility use a cane use a walker ability to climb steps?  yes do you drive?  yes  Function disabled: date disabled 2005 I need assistance with the following:  bathing, meal prep, household duties and shopping  Neuro/Psych weakness numbness tremor tingling trouble walking spasms dizziness confusion depression anxiety  Prior Studies Any changes since last visit?  no  Physicians involved in your care Any changes since last visit?  no   Family History  Problem Relation Age of Onset  . Depression Mother   . Hypertension Mother   . Hypertension Father   . Heart failure Father   . Diabetes Father   . Heart attack Father   . Colon cancer Neg Hx   . Esophageal cancer Neg Hx   . Stomach  cancer Neg Hx   . Rectal cancer Neg Hx    Social History   Socioeconomic History  . Marital status: Married    Spouse name: [name]  . Number of children: 1  . Years of education: some college  . Highest education level: Not on file  Occupational History  . Occupation: On disability    Employer: UNEMPLOYED  Social Needs  . Financial resource strain: Not on file  . Food insecurity:    Worry: Not on file    Inability: Not on file  . Transportation needs:    Medical: Not on file    Non-medical: Not on file  Tobacco Use  . Smoking status: Former Smoker    Packs/day: 0.50    Types: Cigarettes    Last attempt to quit: 02/06/2016    Years since quitting: 2.1  . Smokeless tobacco: Never Used  . Tobacco comment: going to try e-sticks to quit  Substance and Sexual Activity  . Alcohol use: No    Alcohol/week: 0.0 standard drinks  . Drug use: No  . Sexual activity: Not Currently    Partners: Male  Lifestyle  . Physical activity:    Days per week: Not on file    Minutes per session: Not on file  . Stress: Not on file  Relationships  . Social connections:    Talks on phone: Not on file    Gets together: Not on file  Attends religious service: Not on file    Active member of club or organization: Not on file    Attends meetings of clubs or organizations: Not on file    Relationship status: Not on file  Other Topics Concern  . Not on file  Social History Narrative   8-10 cups of soda a day.  On disability. No regular exercise.     Past Surgical History:  Procedure Laterality Date  . ABDOMINAL HYSTERECTOMY  May 2004  . Basel Cell Carcinoma  06/2005, 07/2005   nasal tip and reconstruction  . BREAST CYST ASPIRATION    . CARPAL TUNNEL RELEASE  07/1993   both hands  . COLONOSCOPY    . HEMORROIDECTOMY  July 2003  . KNEE ARTHROPLASTY  09/2001, 05/2003   left knee, chondromalasia  . KNEE ARTHROSCOPY  jan 2005   Right knee, tisse release, chrondromalacia  . REPLACEMENT TOTAL  KNEE  Nov 2005   Left   . THYROID LOBECTOMY  01/2005   malignant area removed from right lobe  . THYROID SURGERY     thyroid lobe removal  . TONSILLECTOMY  09/1972  . TUBAL LIGATION  Sept 1999  . Ulnar nerve entrapment  Feb 1995   left elbow   Past Medical History:  Diagnosis Date  . Allergy   . Anxiety   . Cancer ([place])    cancerous nodules on thyroid  . Cancer ([place])    basal cell Cancer-nose  . Chronic pain syndrome   . COPD (chronic obstructive pulmonary disease) ([place])   . Degeneration of lumbar or lumbosacral intervertebral disc   . Depression   . Facet syndrome, lumbar   . GERD (gastroesophageal reflux disease)   . Hyperlipidemia   . Intervertebral lumbar disc disorder with myelopathy, lumbar region   . Lumbosacral spondylosis without myelopathy   . Migraine without aura, with intractable migraine, so stated, without mention of status migrainosus   . Primary localized osteoarthrosis, lower leg   . Thyroid disease    hypothyroid  . Unspecified musculoskeletal disorders and symptoms referable to neck    cervical/trapezius   BP 136/75   Pulse 81   Ht 5' 8.5" (1.74 m)   Wt 243 lb (110.2 kg)   SpO2 94%   BMI 36.41 kg/m   Opioid Risk Score:   Fall Risk Score:  `1  Depression screen PHQ 2/9  Depression screen [facility] 2/9 01/28/2018 01/28/2018 10/11/2017 10/25/2016 08/25/2016 07/14/2016 11/10/2015  Decreased Interest 3 3 3 3 3 3 3   Down, Depressed, Hopeless 3 3 3 3 3 3 3   PHQ - 2 Score 6 6 6 6 6 6 6   Altered sleeping 3 3 3  - - - -  Tired, decreased energy 3 3 3  - - - -  Change in appetite 3 3 2  - - - -  Feeling bad or failure about yourself  3 3 3  - - - -  Trouble concentrating 1 1 3  - - - -  Moving slowly or fidgety/restless 0 0 0 - - - -  Suicidal thoughts 2 2 0 - - - -  PHQ-9 Score 21 21 20  - - - -  Difficult doing work/chores Extremely dIfficult Extremely dIfficult Extremely dIfficult - - - -  Some encounter information is confidential and restricted. Go to Review  Flowsheets activity to see all data.  Some recent data might be hidden     Review of Systems  Constitutional: Positive for diaphoresis and unexpected weight change.  HENT:  Negative.   Eyes: Negative.   Respiratory: Negative.   Cardiovascular: Negative.   Gastrointestinal: Positive for nausea and vomiting.  Endocrine: Negative.   Genitourinary: Negative.   Musculoskeletal: Positive for arthralgias, back pain, gait problem, joint swelling and myalgias.  Skin: Negative.   Allergic/Immunologic: Negative.   Neurological: Positive for tremors, weakness and numbness.  Hematological: Negative.   Psychiatric/Behavioral: Positive for confusion and dysphoric mood. The patient is nervous/anxious.   All other systems reviewed and are negative.      Objective:   Physical Exam General: No acute distress HEENT: EOMI, oral membranes moist Cards: reg rate  Chest: normal effort Abdomen: Soft, NT, ND Skin: dry, intact Extremities: no edema Musculoskeletal: antalgia bilateral lower ext, both knees tender. LB TTP, limited with ROM.  Neurological:normal exam Psychiatry: pleasant as always         1.Chronic Low Back Pain: -continue MS Contin 100mg  q12, #60 -  ms IR 15mg  q6 prn #120         -We discussed the idea of trying to reduce her breakthrough medication a bit more over the last couple months.  Even if it is cutting 1 of her immediate release morphine in half to around 7.5 mg a few days each month. -We will continue the controlled substance monitoring program, this consists of regular clinic visits, examinations, routine drug screening, pill counts as well as use of [place] Controlled Substance Reporting System. [place] was reviewed today.     2. Degenerative Disk Disease:Continue exercise, and heat therapy.  3. Osteoarthritis of Bilateral Knees: Continue exercise and HEP -Continue compression stockings and edema  control which have been discussed. 4. Migraine Headaches: On Maxalt.   -trial aimovig 70mg  monthly. Medication reviewed, side effects discussed 5. Smoking Cessation:shall continue 6. Constipation: Continue Linzess 7. Muscle Spasm: Continue Tizanidine 8. Chronic depression: celexa. Continue counseling per [place] has been helpful          -service dog very helpful also!!  15 minutesof face to face patient care time was spent during this visit. All questions were encouraged and answered.Follow-up with nurse practitioner in about a month

## 2018-04-10 ENCOUNTER — Other Ambulatory Visit: Payer: Self-pay | Admitting: Physical Medicine & Rehabilitation

## 2018-04-11 ENCOUNTER — Ambulatory Visit: Payer: Medicare Other | Admitting: Family Medicine

## 2018-04-24 ENCOUNTER — Other Ambulatory Visit: Payer: Self-pay

## 2018-04-24 ENCOUNTER — Ambulatory Visit (INDEPENDENT_AMBULATORY_CARE_PROVIDER_SITE_OTHER): Payer: Medicare Other | Admitting: Licensed Clinical Social Worker

## 2018-04-24 DIAGNOSIS — F411 Generalized anxiety disorder: Secondary | ICD-10-CM | POA: Diagnosis not present

## 2018-04-24 DIAGNOSIS — F331 Major depressive disorder, recurrent, moderate: Secondary | ICD-10-CM

## 2018-04-24 DIAGNOSIS — G894 Chronic pain syndrome: Secondary | ICD-10-CM

## 2018-04-24 DIAGNOSIS — M5136 Other intervertebral disc degeneration, lumbar region: Secondary | ICD-10-CM
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
## 2018-05-09 ENCOUNTER — Other Ambulatory Visit: Payer: Self-pay

## 2018-05-09 ENCOUNTER — Encounter: Payer: Medicare Other | Attending: Physical Medicine and Rehabilitation | Admitting: Registered Nurse

## 2018-05-09 ENCOUNTER — Encounter: Payer: Self-pay | Admitting: Registered Nurse

## 2018-05-09 VITALS — Ht 68.5 in | Wt 243.0 lb

## 2018-05-09 DIAGNOSIS — M47816 Spondylosis without myelopathy or radiculopathy, lumbar region: Secondary | ICD-10-CM | POA: Diagnosis not present

## 2018-05-09 DIAGNOSIS — Z79899 Other long term (current) drug therapy: Secondary | ICD-10-CM

## 2018-05-09 DIAGNOSIS — M1712 Unilateral primary osteoarthritis, left knee: Secondary | ICD-10-CM | POA: Diagnosis not present

## 2018-05-09 DIAGNOSIS — Z760 Encounter for issue of repeat prescription: Secondary | ICD-10-CM | POA: Insufficient documentation

## 2018-05-09 DIAGNOSIS — G894 Chronic pain syndrome: Secondary | ICD-10-CM

## 2018-05-09 DIAGNOSIS — M51369 Other intervertebral disc degeneration, lumbar region without mention of lumbar back pain or lower extremity pain: Secondary | ICD-10-CM

## 2018-05-09 DIAGNOSIS — M7062 Trochanteric bursitis, left hip: Secondary | ICD-10-CM

## 2018-05-09 DIAGNOSIS — G9050 Complex regional pain syndrome I, unspecified: Secondary | ICD-10-CM

## 2018-05-09 DIAGNOSIS — M5136 Other intervertebral disc degeneration, lumbar region: Secondary | ICD-10-CM

## 2018-05-09 DIAGNOSIS — Z5181 Encounter for therapeutic drug level monitoring: Secondary | ICD-10-CM | POA: Diagnosis not present

## 2018-05-09 DIAGNOSIS — M1711 Unilateral primary osteoarthritis, right knee: Secondary | ICD-10-CM | POA: Diagnosis not present

## 2018-05-09 DIAGNOSIS — F329 Major depressive disorder, single episode, unspecified: Secondary | ICD-10-CM

## 2018-05-09 DIAGNOSIS — M546 Pain in thoracic spine: Secondary | ICD-10-CM

## 2018-05-09 DIAGNOSIS — G43119 Migraine with aura, intractable, without status migrainosus: Secondary | ICD-10-CM

## 2018-05-09 DIAGNOSIS — M7918 Myalgia, other site: Secondary | ICD-10-CM

## 2018-05-09 DIAGNOSIS — M7061 Trochanteric bursitis, right hip: Secondary | ICD-10-CM

## 2018-05-09 DIAGNOSIS — G8929 Other chronic pain: Secondary | ICD-10-CM

## 2018-05-09 MED ORDER — LAMOTRIGINE 25 MG PO TABS: ORAL_TABLET | ORAL | 2 refills | Status: DC

## 2018-05-09 MED ORDER — MORPHINE SULFATE 15 MG PO TABS: 15.0000 mg | ORAL_TABLET | Freq: Four times a day (QID) | ORAL | 0 refills | Status: DC | PRN

## 2018-05-09 NOTE — Progress Notes (Signed)
Subjective:    Patient ID: [name], female    DOB: 01-29-1961, 58 y.o.   MRN: [id number]  HPI: [name] is a 58 y.o. female her appointment was changed, due to national recommendations of social distancing due to [place] 19, an audio/video telehealth visit is felt to be most appropriate for this patient at this time.  See Chart message from today for the patient's consent to telehealth from [place].   She  states her pain is located in her left shoulder, mid- lower back pain and bilateral hip pain. She rates her pain 7. Her. current exercise regime is walking and performing chair exercises.  [name] Morphine equivalent is 260.00 MME. Last UDS was Performed on 11/19/2017.  [name] CMA asked the Health and History Questions We verified that we were speaking with the correct person using two identifiers.  Pain Inventory Average Pain 7 Pain Right Now 7 My pain is constant, sharp, burning, dull, stabbing, tingling and aching  In the last 24 hours, has pain interfered with the following? General activity 10 Relation with others 10 Enjoyment of life 10 What TIME of day is your pain at its worst? varies Sleep (in general) Poor  Pain is worse with: walking, bending, sitting, inactivity, standing and some activites Pain improves with: heat/ice, therapy/exercise, medication, TENS and injections Relief from Meds: 5  Mobility walk with assistance use a cane use a walker ability to climb steps?  yes do you drive?  yes  Function disabled: date disabled .  Neuro/Psych bladder control problems weakness numbness tremor tingling trouble walking spasms confusion depression anxiety  Prior Studies   Physicians involved in your care    Family History  Problem Relation Age of Onset  . Depression Mother   . Hypertension Mother   . Hypertension Father   . Heart failure Father   . Diabetes Father   . Heart  attack Father   . Colon cancer Neg Hx   . Esophageal cancer Neg Hx   . Stomach cancer Neg Hx   . Rectal cancer Neg Hx    Social History   Socioeconomic History  . Marital status: Married    Spouse name: [name]  . Number of children: 1  . Years of education: some college  . Highest education level: Not on file  Occupational History  . Occupation: On disability    Employer: UNEMPLOYED  Social Needs  . Financial resource strain: Not on file  . Food insecurity:    Worry: Not on file    Inability: Not on file  . Transportation needs:    Medical: Not on file    Non-medical: Not on file  Tobacco Use  . Smoking status: Former Smoker    Packs/day: 0.50    Types: Cigarettes    Last attempt to quit: 02/06/2016    Years since quitting: 2.2  . Smokeless tobacco: Never Used  . Tobacco comment: going to try e-sticks to quit  Substance and Sexual Activity  . Alcohol use: No    Alcohol/week: 0.0 standard drinks  . Drug use: No  . Sexual activity: Not Currently    Partners: Male  Lifestyle  . Physical activity:    Days per week: Not on file    Minutes per session: Not on file  . Stress: Not on file  Relationships  . Social connections:    Talks on phone: Not on file    Gets together: Not on  file    Attends religious service: Not on file    Active member of club or organization: Not on file    Attends meetings of clubs or organizations: Not on file    Relationship status: Not on file  Other Topics Concern  . Not on file  Social History Narrative   8-10 cups of soda a day.  On disability. No regular exercise.     Past Surgical History:  Procedure Laterality Date  . ABDOMINAL HYSTERECTOMY  May 2004  . Basel Cell Carcinoma  06/2005, 07/2005   nasal tip and reconstruction  . BREAST CYST ASPIRATION    . CARPAL TUNNEL RELEASE  07/1993   both hands  . COLONOSCOPY    . HEMORROIDECTOMY  July 2003  . KNEE ARTHROPLASTY  09/2001, 05/2003   left knee, chondromalasia  . KNEE  ARTHROSCOPY  jan 2005   Right knee, tisse release, chrondromalacia  . REPLACEMENT TOTAL KNEE  Nov 2005   Left   . THYROID LOBECTOMY  01/2005   malignant area removed from right lobe  . THYROID SURGERY     thyroid lobe removal  . TONSILLECTOMY  09/1972  . TUBAL LIGATION  Sept 1999  . Ulnar nerve entrapment  Feb 1995   left elbow   Past Medical History:  Diagnosis Date  . Allergy   . Anxiety   . Cancer ([place])    cancerous nodules on thyroid  . Cancer ([place])    basal cell Cancer-nose  . Chronic pain syndrome   . COPD (chronic obstructive pulmonary disease) ([place])   . Degeneration of lumbar or lumbosacral intervertebral disc   . Depression   . Facet syndrome, lumbar   . GERD (gastroesophageal reflux disease)   . Hyperlipidemia   . Intervertebral lumbar disc disorder with myelopathy, lumbar region   . Lumbosacral spondylosis without myelopathy   . Migraine without aura, with intractable migraine, so stated, without mention of status migrainosus   . Primary localized osteoarthrosis, lower leg   . Thyroid disease    hypothyroid  . Unspecified musculoskeletal disorders and symptoms referable to neck    cervical/trapezius   Ht 5' 8.5" (1.74 m) Comment: previous  Wt 243 lb (110.2 kg) Comment: previous  BMI 36.41 kg/m   Opioid Risk Score:   Fall Risk Score:  `1  Depression screen PHQ 2/9  Depression screen [facility] 2/9 01/28/2018 01/28/2018 10/11/2017 10/25/2016 08/25/2016 07/14/2016 11/10/2015  Decreased Interest 3 3 3 3 3 3 3   Down, Depressed, Hopeless 3 3 3 3 3 3 3   PHQ - 2 Score 6 6 6 6 6 6 6   Altered sleeping 3 3 3  - - - -  Tired, decreased energy 3 3 3  - - - -  Change in appetite 3 3 2  - - - -  Feeling bad or failure about yourself  3 3 3  - - - -  Trouble concentrating 1 1 3  - - - -  Moving slowly or fidgety/restless 0 0 0 - - - -  Suicidal thoughts 2 2 0 - - - -  PHQ-9 Score 21 21 20  - - - -  Difficult doing work/chores Extremely dIfficult Extremely dIfficult Extremely  dIfficult - - - -  Some encounter information is confidential and restricted. Go to Review Flowsheets activity to see all data.  Some recent data might be hidden    Review of Systems  Constitutional: Negative.   HENT: Negative.   Eyes: Negative.   Respiratory: Negative.   Cardiovascular:  Negative.   Endocrine: Negative.   Genitourinary: Positive for frequency.       Incontinence  Musculoskeletal: Positive for arthralgias, back pain and gait problem.       Spasms   Allergic/Immunologic: Negative.   Neurological: Positive for dizziness, weakness and numbness.       Tingling  Psychiatric/Behavioral: Positive for dysphoric mood. The patient is nervous/anxious.   All other systems reviewed and are negative.      Objective:   Physical Exam Vitals signs and nursing note reviewed.  Neurological:     Mental Status: She is oriented to person, place, and time.           Assessment & Plan:  1.Chronic Bilateral Thoracic Back Pain/ Low Back Pain/ Lumbar Facet Arthropathy:RefilledMSIR15mg  one tablet every 6 hoursas needed #120and MS Contin100 mg every 12 hours #60.Continue Gabapentin and Pamelor.05/09/2018 We will continue the opioid monitoring program, this consists of regular clinic visits, examinations, urine drug screen, pill counts as well as use of [place] Controlled Substance Reporting System. 2. Degenerative Disk Disease:Encouraged Continue HEP as Tolertatedand heat therapy. Continue Current Medication Regime.05/09/2018. 3. Bilateral Greater Trochanteric Bursitis: Continuecurrent treatment regimenwith Heat and Ice Therapy.05/09/2018. 4. Osteoarthritis of Bilateral Knees: Continue current treatment modality with homeexerciseprogramand heat therapy. 05/09/2018. 5. Migraine Headaches:Continue withMigraine Journal.Continue: Aimovig, LamictalandMaxalt. Continue to Monitor.05/09/2018 6. Smoking Cessation: She has quit smokingsince 08/25/2016. Continue  to Monitor.05/09/2018 7.Constipation: ContinueCurrent medication regime withLinzess.05/09/2018 8. Muscle Spasm: Continuecurrent medication regime withTizanidine.05/09/2018 9. DepressionAnxiety/ Panic Attacks: Continuecurrent medication regimen and treatment modality.Celexa.Lamictaland Counseling with [name] andDr.[place] Psychiatrist.05/09/2018 10. RSD: Continue with current medication Regimewith Gabapentin.05/11/2018  Location of patient: Home Location of provider: Office Total Time Spent: 10 Minutes.   F/U in42month

## 2018-05-15 ENCOUNTER — Ambulatory Visit (HOSPITAL_COMMUNITY): Payer: Medicare Other | Admitting: Licensed Clinical Social Worker

## 2018-05-16 ENCOUNTER — Ambulatory Visit (INDEPENDENT_AMBULATORY_CARE_PROVIDER_SITE_OTHER): Payer: Medicare Other | Admitting: Licensed Clinical Social Worker

## 2018-05-16 DIAGNOSIS — G894 Chronic pain syndrome: Secondary | ICD-10-CM | POA: Diagnosis not present

## 2018-05-16 DIAGNOSIS — F411 Generalized anxiety disorder: Secondary | ICD-10-CM | POA: Diagnosis not present

## 2018-05-16 DIAGNOSIS — F331 Major depressive disorder, recurrent, moderate: Secondary | ICD-10-CM | POA: Diagnosis not present

## 2018-05-16 DIAGNOSIS — M5136 Other intervertebral disc degeneration, lumbar region: Secondary | ICD-10-CM | POA: Diagnosis not present

## 2018-05-16 NOTE — Progress Notes (Signed)
Virtual Visit via Telephone Note  I connected with [name] on 05/16/18 at  8:00 AM EDT by telephone and verified that I am speaking with the correct person using two identifiers.   I discussed the limitations, risks, security and privacy concerns of performing an evaluation and management service by telephone and the availability of in person appointments. I also discussed with the patient that there may be a patient responsible charge related to this service. The patient expressed understanding and agreed to proceed.      THERAPIST PROGRESS NOTE  Session Time: 8:03 AM to 8:58 AM  Participation Level: Active  Behavioral Response: CasualAlertappropriate  Type of Therapy: Individual Therapy  Treatment Goals addressed: Decrease in depression, anxiety and panic  Interventions: CBT, Solution Focused, Strength-based, Supportive and Other: coping  Summary: [name] is a 58 y.o. female who reports that her current schedule revolves around making sure to take her meds and around doctor visits. She describes her sleeping pattern is like "cat naps" and will sleep for 1 hour to 11/2 hour then wake up both at night and during the day. Doesn't know when she will sleep, will sleep if she is sitting because of this pattern.  Therapist encouraged a schedule that would help with mood, engaging in activities that are easing and rewarding. Patient relates that she is willing to try anything once. Reviewed activities that she engages in that she enjoys. She enjoys coloring and drawing, loves her service dog, gets involved with a variety of activities through [employer] that include games. She will get notices from [place] for different games. On different websites she  looks for things for daughter, [name], and grandchild, [name]. Therapist pointed out that it is nice for her to this for family.   Reviewed concerns for coronavirus and patient reports that "you have to  have a plan" and has for awhile stored both water and food so that they have enough from 6 months to a year. Relates that this had been a concern when you see the devastation from hurricanes and climate change.  Talked about continued estrangement from daughter and reviewed that "she is the best thing that ever happened to me" her concern is more time goes by the harder it will be to start a dialogue.  Sent things to her that are returned, Dozen of emails that must go to junk mail, phone calls that go to voicemail, not sure if she is blocking her. .  Suicidal/Homicidal: No  Therapist Response: Therapist assessed patient current functioning per report and processed feelings related to life stressors.  Discussed sleeping patterns and encourage patient with developing a schedule introduced therapy technique of behavior activation that incorporates keeping his schedule including activities that are easy and rewarding as replacements for all behavior that contribute to low mood activities need to be easy and rewarding to increase the likelihood that clients will follow through.  Reviewed activities for patient that she is doing and could incorporate into schedule that would help with mood.  Gust schedule also helps her being productive, activities helping with positive mood discussed having a balance helps with ones wellbeing and mental health activities that include purposeful activities, activities she enjoys and staying connected to others. Pointed out patient's thoughtfulness for others is very helpful for coping strategy both in helping the other person as well as helping patient's own mood.   Discussed patient's estrangement from daughter and her concern that the longer time goes by the more difficult  it will be to have conversation.  Therapist encouraged patient to recognize she will have to allow it to be in her daughter's time and what ever process her daughter needs to go through but on patient's part  helpfulness to be open to conversation.  Reviewed possibility of patient reaching out to her once in a while to let daughter know she cares and even if she does not get a response does not mean the message is not being heard that she cares about her and open to mending whatever needs mended.  At the same time therapist left open the possibility that the future is unknown so cannot predict the future to determine exactly what will happen.  But from therapeutic experience people need to figure things out and happens in their own time and just have to give him the space and time to do that.  Provided strength based and supportive interventions. Assess that we discussed activities but need to develop an actual schedule. Also access that for patient due to both emotional and physical isolation that supportive interventions are therapeutic for patient.  Plan: Return again in 2 weeks. 2.  Therapist continue to work with patient on decrease in anxiety, depression, panic 3.  Therapist will work with patient on being more specific and developing a schedule  Diagnosis: Axis I: major depressive disorder, recurrent, moderate, degenerative disc disease, chronic pain syndrome, generalized anxiety disorder    Axis II: No diagnosis  Follow Up Instructions:    I discussed the assessment and treatment plan with the patient. The patient was provided an opportunity to ask questions and all were answered. The patient agreed with the plan and demonstrated an understanding of the instructions.   The patient was advised to call back or seek an in-person evaluation if the symptoms worsen or if the condition fails to improve as anticipated.  I provided 55 minutes of non-face-to-face time during this encounter.    [name], LCSW 05/16/2018

## 2018-05-23 ENCOUNTER — Telehealth: Payer: Self-pay

## 2018-05-23 DIAGNOSIS — M1711 Unilateral primary osteoarthritis, right knee: Secondary | ICD-10-CM

## 2018-05-23 DIAGNOSIS — M1712 Unilateral primary osteoarthritis, left knee: Secondary | ICD-10-CM

## 2018-05-23 DIAGNOSIS — Z79899 Other long term (current) drug therapy: Secondary | ICD-10-CM

## 2018-05-23 DIAGNOSIS — G894 Chronic pain syndrome: Secondary | ICD-10-CM

## 2018-05-23 DIAGNOSIS — Z5181 Encounter for therapeutic drug level monitoring: Secondary | ICD-10-CM

## 2018-05-23 MED ORDER — MORPHINE SULFATE ER 100 MG PO TBCR: 100.0000 mg | EXTENDED_RELEASE_TABLET | Freq: Two times a day (BID) | ORAL | 0 refills | Status: DC

## 2018-05-23 NOTE — Telephone Encounter (Signed)
Patient called stating Morphine 100 mg was not sent for her this month. According to chart Morphine IR 15 mg was sent in but not Morphine ER 100 mg.

## 2018-05-23 NOTE — Telephone Encounter (Signed)
Return [name] call, MS Contin e-scribed, she is aware of the above and verbalizes understanding.

## 2018-05-30 ENCOUNTER — Ambulatory Visit (INDEPENDENT_AMBULATORY_CARE_PROVIDER_SITE_OTHER): Payer: Medicare Other | Admitting: Licensed Clinical Social Worker

## 2018-05-30 DIAGNOSIS — G894 Chronic pain syndrome: Secondary | ICD-10-CM | POA: Diagnosis not present

## 2018-05-30 DIAGNOSIS — F411 Generalized anxiety disorder: Secondary | ICD-10-CM

## 2018-05-30 DIAGNOSIS — F331 Major depressive disorder, recurrent, moderate: Secondary | ICD-10-CM | POA: Diagnosis not present

## 2018-05-30 NOTE — Progress Notes (Signed)
Virtual Visit via Telephone Note  I connected with [name] on 05/30/18 at  8:00 AM EDT by telephone and verified that I am speaking with the correct person using two identifiers.   I discussed the limitations, risks, security and privacy concerns of performing an evaluation and management service by telephone and the availability of in person appointments. I also discussed with the patient that there may be a patient responsible charge related to this service. The patient expressed understanding and agreed to proceed.  Follow Up Instructions:    I discussed the assessment and treatment plan with the patient. The patient was provided an opportunity to ask questions and all were answered. The patient agreed with the plan and demonstrated an understanding of the instructions.   The patient was advised to call back or seek an in-person evaluation if the symptoms worsen or if the condition fails to improve as anticipated.  I provided 52 minutes of non-face-to-face time during this encounter.   THERAPIST PROGRESS NOTE  Session Time: 8:02 AM to 8:54 AM  Participation Level: Active  Behavioral Response: CasualAlertEuthymic  Type of Therapy: Individual Therapy  Treatment Goals addressed: Decrease in depression, anxiety and panic  Interventions: Solution Focused, Strength-based, Supportive and Other: coping  Summary: [name] is a 58 y.o. female who presents with mood same as always, "I guess depression" and that [name], support dog helps with mood. Very helpful as a coping strategy.  Reviewed pain issues and how they impact her mental health.  Reviewed other strategies besides medications.  Patient has gone to physical therapy, receives trigger point injections that sometimes help and sometimes do not and can have only so many a year.  Shares she is just getting over headache.  Woke up a few days with it and just getting over it today.  Reviewed note in session of nurse  who manages medical issues and reviewed recommendation for home exercise program and heat therapy.  Patient shares physical therapist came to her house who taught her exercises and she continues to do them, relates watches video chair exercises.  Does walk around the house, has pushed her self to walk outside scares her to fall.  Patient says would not be comfortable with massage, difficulty in getting into the shower to have soothing shower.  Does see some benefit from exercise and giving example that she is able to bend her knees for longer periods of time has help with movement Reviewed plan to wean her down opiates patient has been on for years and years feels like opiates being taken away and it helps. Shared information she learned that when taking artificially your body stops making it because you are already getting them.  Plan is to have opiates that bodies naturally producing to be, effective again.  Deals with pain through ice packs throughout the day.  Provides description of pain like  "Cold metal teeth in her knees" then does not want to move.     There is husband does not like to see here in pain and he likes to help when he can. he likes to help.  Same as always nothing change very much. [name] helps with mood. Depression Suicidal/Homicidal: No  Therapist Response: Therapist assessed patient current functioning per report and processed feelings related to current stressors. Assess patient sense of humor as an ego strengths that helps her with coping, is a quality that provides resilience.  Reviewed other options for pain management besides medications such as acupuncture, massage,  physical therapy, being in a warm bath or shower.  Encourage patient with exercise program that physical therapist at her is helpful for pain management.  Processed feelings related to change in medications and her concern of worsening pain issues.  Focused on positive reported by patient that it may lead to her  own natural opiates becoming active again once opiate medications are decreased as well as other medications to replace that help with pain.  Started to review worksheet titled 99 coping skills to suggest fine ones that would be helpful for patient.  Focus on positive aspect of relationship that husband is supportive and wanting to help her as much as he can alleviate pain symptoms.  Identify her dog is significant coping strategy.   Provided supportive and strength-based intervention.  Plan: Return again in 2 weeks.2.  Therapist work with patient on stress management, strategies to improve depression and decrease anxiety, continue to explore pain management techniques, coping  Diagnosis: Axis I:  major depressive disorder, recurrent, moderate, degenerative disc disease, chronic pain syndrome, generalized anxiety disorder    Axis II: No diagnosis    [name], LCSW 05/30/2018

## 2018-06-07 ENCOUNTER — Other Ambulatory Visit: Payer: Self-pay

## 2018-06-07 ENCOUNTER — Encounter: Payer: Medicare Other | Attending: Physical Medicine and Rehabilitation | Admitting: Registered Nurse

## 2018-06-07 ENCOUNTER — Encounter: Payer: Self-pay | Admitting: Registered Nurse

## 2018-06-07 VITALS — Ht 68.5 in | Wt 243.0 lb

## 2018-06-07 DIAGNOSIS — M1712 Unilateral primary osteoarthritis, left knee: Secondary | ICD-10-CM | POA: Diagnosis not present

## 2018-06-07 DIAGNOSIS — F329 Major depressive disorder, single episode, unspecified: Secondary | ICD-10-CM

## 2018-06-07 DIAGNOSIS — M1711 Unilateral primary osteoarthritis, right knee: Secondary | ICD-10-CM

## 2018-06-07 DIAGNOSIS — M7918 Myalgia, other site: Secondary | ICD-10-CM

## 2018-06-07 DIAGNOSIS — Z79899 Other long term (current) drug therapy: Secondary | ICD-10-CM | POA: Diagnosis not present

## 2018-06-07 DIAGNOSIS — G9050 Complex regional pain syndrome I, unspecified: Secondary | ICD-10-CM

## 2018-06-07 DIAGNOSIS — G43119 Migraine with aura, intractable, without status migrainosus: Secondary | ICD-10-CM

## 2018-06-07 DIAGNOSIS — M47816 Spondylosis without myelopathy or radiculopathy, lumbar region: Secondary | ICD-10-CM

## 2018-06-07 DIAGNOSIS — Z760 Encounter for issue of repeat prescription: Secondary | ICD-10-CM | POA: Insufficient documentation

## 2018-06-07 DIAGNOSIS — G894 Chronic pain syndrome: Secondary | ICD-10-CM

## 2018-06-07 DIAGNOSIS — M7061 Trochanteric bursitis, right hip: Secondary | ICD-10-CM

## 2018-06-07 DIAGNOSIS — M7062 Trochanteric bursitis, left hip: Secondary | ICD-10-CM

## 2018-06-07 DIAGNOSIS — Z5181 Encounter for therapeutic drug level monitoring: Secondary | ICD-10-CM

## 2018-06-07 DIAGNOSIS — M5136 Other intervertebral disc degeneration, lumbar region: Secondary | ICD-10-CM

## 2018-06-07 MED ORDER — MORPHINE SULFATE ER 100 MG PO TBCR: 100.0000 mg | EXTENDED_RELEASE_TABLET | Freq: Two times a day (BID) | ORAL | 0 refills | Status: DC

## 2018-06-07 MED ORDER — MORPHINE SULFATE 15 MG PO TABS: 15.0000 mg | ORAL_TABLET | Freq: Four times a day (QID) | ORAL | 0 refills | Status: DC | PRN

## 2018-06-07 MED ORDER — TIZANIDINE HCL 2 MG PO TABS: 2.0000 mg | ORAL_TABLET | Freq: Three times a day (TID) | ORAL | 5 refills | Status: DC | PRN

## 2018-06-07 NOTE — Progress Notes (Signed)
Subjective:    Patient ID: [name], female    DOB: 05-Jul-1960, 58 y.o.   MRN: [id number]  HPI: [name] is a 58 y.o. female her appointment was changed, due to national recommendations of social distancing due to [place] 19, an audio/video telehealth visit is felt to be most appropriate for this patient at this time.  See Chart message from today for the patient's consent to telehealth from [place].     She states her pain is located in her lower back radiating into her bilateral lower extremities L>R, bilateral hip pain and bilateral knee pain.  She rates her pain 7. Her current exercise regime is walking and performing stretching exercises.  [name] Morphine equivalent is 260.00 MME.  Last UDS was Performed on 11/19/2017, it was consistent.   Discuss with [name]  And reviewed Dr. Naaman Plummer note regarding the  slow weaning of her MS Contin. She verbalizes understanding and in agreement. We will began in June.   [name] CMA asked the Health and History Questions. This provider and Kennon Rounds verified we were speaking with the correct person using two identifiers.   Pain Inventory Average Pain 7 Pain Right Now 7 My pain is constant, sharp, burning, dull, stabbing, tingling and aching  In the last 24 hours, has pain interfered with the following? General activity 7 Relation with others 7 Enjoyment of life 7 What TIME of day is your pain at its worst? varies Sleep (in general) Poor  Pain is worse with: walking, bending, sitting, inactivity, standing and some activites Pain improves with: heat/ice, therapy/exercise, medication, TENS and injections Relief from Meds: 5  Mobility walk with assistance use a cane use a walker how many minutes can you walk? 5 ability to climb steps?  yes do you drive?  yes  Function disabled: date disabled na I need assistance with the following:  meal prep, household duties  and shopping  Neuro/Psych bladder control problems weakness numbness tremor tingling trouble walking spasms confusion depression anxiety  Prior Studies Any changes since last visit?  no  Physicians involved in your care Psychologist [place]   Family History  Problem Relation Age of Onset  . Depression Mother   . Hypertension Mother   . Hypertension Father   . Heart failure Father   . Diabetes Father   . Heart attack Father   . Colon cancer Neg Hx   . Esophageal cancer Neg Hx   . Stomach cancer Neg Hx   . Rectal cancer Neg Hx    Social History   Socioeconomic History  . Marital status: Married    Spouse name: [name]  . Number of children: 1  . Years of education: some college  . Highest education level: Not on file  Occupational History  . Occupation: On disability    Employer: UNEMPLOYED  Social Needs  . Financial resource strain: Not on file  . Food insecurity:    Worry: Not on file    Inability: Not on file  . Transportation needs:    Medical: Not on file    Non-medical: Not on file  Tobacco Use  . Smoking status: Former Smoker    Packs/day: 0.50    Types: Cigarettes    Last attempt to quit: 02/06/2016    Years since quitting: 2.3  . Smokeless tobacco: Never Used  . Tobacco comment: going to try e-sticks to quit  Substance and Sexual Activity  .  Alcohol use: No    Alcohol/week: 0.0 standard drinks  . Drug use: No  . Sexual activity: Not Currently    Partners: Male  Lifestyle  . Physical activity:    Days per week: Not on file    Minutes per session: Not on file  . Stress: Not on file  Relationships  . Social connections:    Talks on phone: Not on file    Gets together: Not on file    Attends religious service: Not on file    Active member of club or organization: Not on file    Attends meetings of clubs or organizations: Not on file    Relationship status: Not on file  Other Topics Concern  . Not on  file  Social History Narrative   8-10 cups of soda a day.  On disability. No regular exercise.     Past Surgical History:  Procedure Laterality Date  . ABDOMINAL HYSTERECTOMY  May 2004  . Basel Cell Carcinoma  06/2005, 07/2005   nasal tip and reconstruction  . BREAST CYST ASPIRATION    . CARPAL TUNNEL RELEASE  07/1993   both hands  . COLONOSCOPY    . HEMORROIDECTOMY  July 2003  . KNEE ARTHROPLASTY  09/2001, 05/2003   left knee, chondromalasia  . KNEE ARTHROSCOPY  jan 2005   Right knee, tisse release, chrondromalacia  . REPLACEMENT TOTAL KNEE  Nov 2005   Left   . THYROID LOBECTOMY  01/2005   malignant area removed from right lobe  . THYROID SURGERY     thyroid lobe removal  . TONSILLECTOMY  09/1972  . TUBAL LIGATION  Sept 1999  . Ulnar nerve entrapment  Feb 1995   left elbow   Past Medical History:  Diagnosis Date  . Allergy   . Anxiety   . Cancer ([place])    cancerous nodules on thyroid  . Cancer ([place])    basal cell Cancer-nose  . Chronic pain syndrome   . COPD (chronic obstructive pulmonary disease) ([place])   . Degeneration of lumbar or lumbosacral intervertebral disc   . Depression   . Facet syndrome, lumbar   . GERD (gastroesophageal reflux disease)   . Hyperlipidemia   . Intervertebral lumbar disc disorder with myelopathy, lumbar region   . Lumbosacral spondylosis without myelopathy   . Migraine without aura, with intractable migraine, so stated, without mention of status migrainosus   . Primary localized osteoarthrosis, lower leg   . Thyroid disease    hypothyroid  . Unspecified musculoskeletal disorders and symptoms referable to neck    cervical/trapezius   There were no vitals taken for this visit.  Opioid Risk Score:   Fall Risk Score:  `1  Depression screen PHQ 2/9  Depression screen [facility] 2/9 06/07/2018 01/28/2018 01/28/2018 10/11/2017 10/25/2016 08/25/2016 07/14/2016  Decreased Interest 3 3 3 3 3 3 3   Down, Depressed, Hopeless 3 3 3 3 3 3 3   PHQ - 2 Score 6 6 6 6  6 6 6   Altered sleeping - 3 3 3  - - -  Tired, decreased energy - 3 3 3  - - -  Change in appetite - 3 3 2  - - -  Feeling bad or failure about yourself  - 3 3 3  - - -  Trouble concentrating - 1 1 3  - - -  Moving slowly or fidgety/restless - 0 0 0 - - -  Suicidal thoughts - 2 2 0 - - -  PHQ-9 Score - 21 21  20 - - -  Difficult doing work/chores - Extremely dIfficult Extremely dIfficult Extremely dIfficult - - -  Some encounter information is confidential and restricted. Go to Review Flowsheets activity to see all data.  Some recent data might be hidden     Review of Systems  Constitutional: Negative.   HENT: Negative.   Eyes: Negative.   Respiratory: Negative.   Cardiovascular: Negative.   Gastrointestinal: Negative.   Endocrine: Negative.   Genitourinary: Positive for frequency.  Musculoskeletal: Positive for back pain and gait problem.       Spasms  Skin: Negative.   Allergic/Immunologic: Negative.   Neurological: Positive for tremors, weakness and numbness.       Tingling  Hematological: Negative.   Psychiatric/Behavioral: Positive for confusion and dysphoric mood. The patient is nervous/anxious.   All other systems reviewed and are negative.      Objective:   Physical Exam Vitals signs and nursing note reviewed.  Musculoskeletal:     Comments: No Physical Exam Performed: Virtual Visit  Neurological:     Mental Status: She is oriented to person, place, and time.           Assessment & Plan:  1.Chronic Bilateral Thoracic Back Pain/ Low Back Pain/ Lumbar Facet Arthropathy:RefilledMSIR15mg  one tablet every 6 hoursas needed #120and MS Contin100 mg every 12 hours #60.We will begin the slow weaning of MS Contin in June, she verbalizes understanding. Continue Gabapentin and Pamelor.06/07/2018 We will continue the opioid monitoring program, this consists of regular clinic visits, examinations, urine drug screen, pill counts as well as use of [place]  Controlled Substance Reporting System. 2. Degenerative Disk Disease:Encouraged Continue HEP as Tolertatedand heat therapy. Continue Current Medication Regime.06/07/2018. 3.Bilateral Greater Trochanteric Bursitis: Continuecurrent treatment regimenwith Heat and Ice Therapy.06/07/2018. 4. Osteoarthritis of Bilateral Knees: Continue current treatment modality with homeexerciseprogramand heat therapy. 06/07/2018. 5. Migraine Headaches:Continue withMigraine Journal.Continue: Aimovig, LamictalandMaxalt. Continue to Monitor.06/07/2018 6. Smoking Cessation: She has quit smokingsince 08/25/2016. Continue to Monitor.06/07/2018 7.Constipation: ContinueCurrent medication regime withLinzess.06/07/2018 8. Muscle Spasm: Continuecurrent medication regime withTizanidine.06/07/2018 9. DepressionAnxiety/ Panic Attacks: Continuecurrent medication regimen and treatment modality.Celexa.Lamictaland Counseling with [name] andDr.[place] Psychiatrist.06/07/2018 10. RSD: Continue with current medication Regimewith Gabapentin.06/07/2018  Telephone Call  Location of patient: Home Location of provider: Office Total Time Spent: 15 Minutes.   F/U in13month

## 2018-06-10 ENCOUNTER — Other Ambulatory Visit: Payer: Self-pay

## 2018-06-10 ENCOUNTER — Ambulatory Visit (INDEPENDENT_AMBULATORY_CARE_PROVIDER_SITE_OTHER): Payer: Medicare Other | Admitting: Licensed Clinical Social Worker

## 2018-06-10 DIAGNOSIS — F331 Major depressive disorder, recurrent, moderate: Secondary | ICD-10-CM | POA: Diagnosis not present

## 2018-06-10 DIAGNOSIS — G894 Chronic pain syndrome: Secondary | ICD-10-CM | POA: Diagnosis not present

## 2018-06-10 DIAGNOSIS — F411 Generalized anxiety disorder: Secondary | ICD-10-CM

## 2018-06-10 DIAGNOSIS — M5136 Other intervertebral disc degeneration, lumbar region: Secondary | ICD-10-CM

## 2018-06-10 NOTE — Progress Notes (Signed)
Virtual Visit via Telephone Note  I connected with [name] on 06/10/18 at  8:00 AM EDT by telephone and verified that I am speaking with the correct person using two identifiers.   I discussed the limitations, risks, security and privacy concerns of performing an evaluation and management service by telephone and the availability of in person appointments. I also discussed with the patient that there may be a patient responsible charge related to this service. The patient expressed understanding and agreed to proceed.  Follow Up Instructions:    I discussed the assessment and treatment plan with the patient. The patient was provided an opportunity to ask questions and all were answered. The patient agreed with the plan and demonstrated an understanding of the instructions.   The patient was advised to call back or seek an in-person evaluation if the symptoms worsen or if the condition fails to improve as anticipated.  I provided 52 minutes of non-face-to-face time during this encounter.    THERAPIST PROGRESS NOTE  Session Time: 8:01 AM to 8:53 AM  Participation Level: Active  Behavioral Response: CasualAlertEuthymic  Type of Therapy: Individual Therapy  Treatment Goals addressed:  Decrease in depression, anxiety and panic  Interventions: Solution Focused, Strength-based, Reframing and Other: stress management, coping  Summary: [name] is a 58 y.o. female who presents with not getting much sleep and relates it to pain, don't sleep well in general, 2 nights and thee days slept about 5 hours. When she can't sleep asks herself what she can do to make herself go to sleep. Tried to read but kept reading the same paragraph, tries to watch something but not interested in anything, coloring is relaxing although doesn't help her sleep. Has tried everything for sleep and nothing has worked.  Shares that a saying applies to her careful what you ask for, you might just  get it. She has ice packs and  heating pads on during the day. Extended release not working so weaning her off. She was at a point of asking for a change and increase when the opiate crisis hit and now want to take medications away. It hurts her feelings, as well as literally hurt her. Asks herself why she is punished for things other people did. Can't take NSAIDS, can't take certain medications because she is allergic to many,  morphine helps numb pain but she is tied to chair, "hips have to let them go can't do anything with them."  On her mind a lot about how provider is going to manage pain. Wonders how long it will take for natural pain relief chemicals to start to work, and  how much will it hurt to get to that point.  Wonders if that was the case when first started having pain. She had surgery on R knee, orthoscopic surgery. Then two months latter did  the other one. Describes burning pain while cold to the touch. Maybe that was related to natural pain killers not taking over. Kept gong back to the doctor and they couldn't find anything, caused her to feel like they thought she was crazy or a liar. Went to  [facility] and they thought she had  RSD, reflex sympathetic dystrophy, explains this when something in head triggered, body telling you that you are having pain, in your head, but not doing it on purpose, done unconsciously.  Shares feeling panicky feeling, almost paranoia about taking away medications.  Therapist worked on reframing and some of the beneficial aspects  to doctor's approach  (see see below) and patient relates building up a tolearance and that the and the pain comes back. Needed to make change but not to decrease medications if anything needed medications to be increased.  Provider's advice has been to exercise, acupuncture. Doesn't believe in acupuncture. Have told her that she has an ice pack and heating pad to make her comfortable.  There is not a happy medium for  comfort in shifting pads and ice around, either feeling hot or cold. They say you have got to do what you have to do until her body starts making its own natural opiates.   Describes ongoing stress, has enough, walk across the floor scares her. She has had falls and gone to the hospital. The outside terrifies her.   Her body has been a Wellsite geologist and makes her frustrated and mad. Gets so mad where she wants to scream. Cried more than she has in her life, didn't cry three decades. Need to. Too busy then. Rasing three kids 7-17. Can't get a word in with husband,  talks louder, frustrates her, raise voice to get in there and he says why talking so loud. Mom do the same thing. Keep going and try to get a word in when she can.  Describes past and working and raising kids, working during the day and Engineer, maintenance at night. Yard work a pleasure. Pressure and not fun when in the business. Love flowers always messing with flowers. Artistically inclined, doodle on other side of her coloring, drawing caricatures, encouraged her with continuing creative activities as they help with mood.  Reviewed session and patient relates it helps to get things out in session.   Suicidal/Homicidal: No  Therapist Response: Therapist processed feelings related to current stressors including pain issues and tapering her off opiates.  Utilize reframing to look at situation differently to see some of the benefits that decreasing meds will be helpful for body as well as allowing healthier approach of natural opiates taking over.  Provided supportive interventions related to stressors. Utilize insight building based on story support in session that her expectations can be off base, as a cognitive strategy to challenge inaccurate and unhelpful thoughts. Reinforced patient's strength of humor and reviewed other ego strengths such as intelligence, self reflection, setting boundaries and related humor is main ego strength to emphasize how important  it is.  Reviewed a little past history discussing significant stressors building up over time, patient not releasing emotions, why helpful now to release emotions to manage feelings and could play a part in her physical symptoms.  Such as raising 3 kids and having 2 jobs.   Emphasized patient's strength of being artistic and how her engaging in creative activities helpful for mood helpful for coping.  Therapist working with patient on acceptance as a strategy that will help her deal with current circumstances, resisting them can make it worse and acceptance helps her to release her energy to move forward deal with her circumstances and a more helpful way.   Provided strength based and supportive intervention. Artsitic and creation good coping, get things out Working on acceptance.  Plan: Return again in 2 weeks.2.Therapist work with patient on stress management, strategies to improve depression and decrease anxiety, continue to explore pain management techniques, coping  Diagnosis: Axis I:  major depressive disorder, recurrent, moderate, degenerative disc disease, chronic pain syndrome, generalized anxiety disorder    Axis II: No diagnosis    [name], LCSW 06/10/2018

## 2018-06-24 ENCOUNTER — Ambulatory Visit (INDEPENDENT_AMBULATORY_CARE_PROVIDER_SITE_OTHER): Payer: Medicare Other | Admitting: Licensed Clinical Social Worker

## 2018-06-24 DIAGNOSIS — G894 Chronic pain syndrome: Secondary | ICD-10-CM

## 2018-06-24 DIAGNOSIS — M5136 Other intervertebral disc degeneration, lumbar region: Secondary | ICD-10-CM

## 2018-06-24 DIAGNOSIS — F331 Major depressive disorder, recurrent, moderate: Secondary | ICD-10-CM | POA: Diagnosis not present

## 2018-06-24 DIAGNOSIS — F411 Generalized anxiety disorder: Secondary | ICD-10-CM

## 2018-06-24 NOTE — Progress Notes (Signed)
Virtual Visit via Telephone Note  I connected with [name] on 06/24/18 at  8:00 AM EDT by telephone and verified that I am speaking with the correct person using two identifiers.   I discussed the limitations, risks, security and privacy concerns of performing an evaluation and management service by telephone and the availability of in person appointments. I also discussed with the patient that there may be a patient responsible charge related to this service. The patient expressed understanding and agreed to proceed.   THERAPIST PROGRESS NOTE  Session Time: 8:02 AM to 8:54 AM  Participation Level: Active  Behavioral Response: CasualAlertEuthymic in session but dealing with pain from migraine so in general dysphoric  Type of Therapy: Individual Therapy  Treatment Goals addressed:  Decrease in depression, anxiety and panic  Interventions: Solution Focused, Strength-based, Supportive and Other: coping, strategies to enhance mood  Summary: [name] is a 58 y.o. female who presents with getting over migraine, her sleep schedule turned around, didn't sleep last night.  Shared about treatment experience with biofeedback (a potentially helpful intervention for patient) at 15 for headaches. Back then headaches lasted  3-4 hours. Headaches started at 12, unbearable pain.  Reviewed effectiveness of biofeedback and patient relates has a tendency not to trust doctors, does not trust unless develop rapport with doctors, so treatment ineffective. Reports she trusts current providers for pain, has been there for years so really know her. Don't trust unless develop rapport with doctors.  Thus indicating importance of therapeutic rapport for clinical effectiveness with patient. Reviewed people who were funny in her family,  Grandparent dad and describes herself as "daddy's girl, Patient has kept this tradition, humor helps. "Can't feel bad when laughing". Discussed helpfulness of her  support dog, that he stays right with her. When she looks at him can't help but laugh.   Pain doctors appointment coming up and has to go in. "Love them the death". Been there a long time. They listen. It is the biggest thing is to listen, to vent. They know when down, hurting, they know and it shows on her face Reviewed their expectation for therapy, believes that with chronic pain get depressed and helps with pain.  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  Something she could do to express herself, has always had Acupuncturist. Like to create something beautiful, for awhile enjoyed doing pottery. Had to quit when raising three kids and 2 jobs. Talked about grandchild, "she sees me". Describes lonely because can't see each other. This relationship is significant and positive for patient, appreciates her creativity. Comes up with a box of artistic things for them. Shared about [name], grandchild's drawing in hall, portrays patient in a box, really grabbed patient as she is in her chair all the time. Shared about a gazebo in her back that they fixed up, therapist pointing out that it seem like a very nice place, encouraged her to get out side and enjoy her gazebo.  Shared positive memories about times they spent in gazebo with friends, had a bar had tables really fixed up.   Reviewed session and patient relates she enjoys talking with therapist that she has no one else to talk to besides [name], her adopted daughter. Suicidal/Homicidal: No  Therapist Response: Therapist assessed patient current functioning per report and processed feelings related to current stressors assessed patient's medical issues currently migraine contribute to get of  mental health symptoms.   Reviewed parts of her life that can help contribute to positive mood as 1 of goals is to decrease depression.  Focused on aspects of her life that  help with mood.  Talked about her granddaughter who brightens mood, creativity as a strength of patient's that has ongoing positive impact on mood.  Talked about patient's sense of humor and now humor provides for all of [place] bright spots in her life.  Courage her to get outside and discussed positive psychology that there is things you can do to increase mood especially when she has a gazebo which they made into a very nice place to hang out.   In discussing being outside patient brought up and talking to pain psychologist that she was encouraged to spend 15 to 20 minutes outside in the morning and night help her with a regular schedule and therapist encouraged this as well as beneficial to mood as well.   Reviewed positive memories that patient shared with therapist and in relating these experiences assessed also helpful for patient for enhancing mood. Provided strength based and supportive interventions.  Plan: Return again in 3 weeks.2.  Therapist continue to work with patient on strategies to enhance mood, coping strategies to address anxiety symptoms  Diagnosis: Axis I: major depressive disorder, recurrent, moderate, degenerative disc disease, chronic pain syndrome, generalized anxiety disorder    Axis II: No diagnosis  Follow Up Instructions:    I discussed the assessment and treatment plan with the patient. The patient was provided an opportunity to ask questions and all were answered. The patient agreed with the plan and demonstrated an understanding of the instructions.   The patient was advised to call back or seek an in-person evaluation if the symptoms worsen or if the condition fails to improve as anticipated.  I provided 52 minutes of non-face-to-face time during this encounter.   [name], LCSW 06/24/2018

## 2018-07-08 ENCOUNTER — Other Ambulatory Visit: Payer: Self-pay

## 2018-07-08 ENCOUNTER — Encounter: Payer: Medicare Other | Attending: Physical Medicine and Rehabilitation | Admitting: Registered Nurse

## 2018-07-08 ENCOUNTER — Encounter: Payer: Self-pay | Admitting: Registered Nurse

## 2018-07-08 VITALS — Ht 68.0 in | Wt 250.0 lb

## 2018-07-08 DIAGNOSIS — Z5181 Encounter for therapeutic drug level monitoring: Secondary | ICD-10-CM

## 2018-07-08 DIAGNOSIS — M5136 Other intervertebral disc degeneration, lumbar region: Secondary | ICD-10-CM | POA: Diagnosis not present

## 2018-07-08 DIAGNOSIS — M7061 Trochanteric bursitis, right hip: Secondary | ICD-10-CM

## 2018-07-08 DIAGNOSIS — G43119 Migraine with aura, intractable, without status migrainosus: Secondary | ICD-10-CM | POA: Diagnosis not present

## 2018-07-08 DIAGNOSIS — M7062 Trochanteric bursitis, left hip: Secondary | ICD-10-CM

## 2018-07-08 DIAGNOSIS — M1712 Unilateral primary osteoarthritis, left knee: Secondary | ICD-10-CM

## 2018-07-08 DIAGNOSIS — Z760 Encounter for issue of repeat prescription: Secondary | ICD-10-CM | POA: Insufficient documentation

## 2018-07-08 DIAGNOSIS — M51369 Other intervertebral disc degeneration, lumbar region without mention of lumbar back pain or lower extremity pain: Secondary | ICD-10-CM

## 2018-07-08 DIAGNOSIS — M1711 Unilateral primary osteoarthritis, right knee: Secondary | ICD-10-CM | POA: Diagnosis not present

## 2018-07-08 DIAGNOSIS — M47816 Spondylosis without myelopathy or radiculopathy, lumbar region: Secondary | ICD-10-CM

## 2018-07-08 DIAGNOSIS — G894 Chronic pain syndrome: Secondary | ICD-10-CM

## 2018-07-08 DIAGNOSIS — Z79899 Other long term (current) drug therapy: Secondary | ICD-10-CM

## 2018-07-08 MED ORDER — MORPHINE SULFATE 15 MG PO TABS: 15.0000 mg | ORAL_TABLET | Freq: Four times a day (QID) | ORAL | 0 refills | Status: DC | PRN

## 2018-07-08 MED ORDER — MORPHINE SULFATE ER 100 MG PO TBCR: 100.0000 mg | EXTENDED_RELEASE_TABLET | Freq: Two times a day (BID) | ORAL | 0 refills | Status: DC

## 2018-07-08 NOTE — Progress Notes (Signed)
Subjective:    Patient ID: [name], female    DOB: 11/29/1960, 58 y.o.   MRN: [id number]  HPI: [name] is a 58 y.o. female her appointment was changed, due to national recommendations of social distancing due to [place] 19, an audio/video telehealth visit is felt to be most appropriate for this patient at this time.  See Chart message from today for the patient's consent to telehealth from [place].     She  states her pain is located in her left shoulder, mid- lower back, bilateral hips and bilateral knees. Also reports she has a migraine the onset was last week and it's easing off. She rates her pain 8. Her.current exercise regime is walking and performing chair stretching exercises every other day.   [name] Morphine equivalent is 260.00 MME. Last UDS was Performed on 11/19/2017, it was consistent.   [name] CMA asked the Health and History Questions. This provider and [name]  verified we were speaking with the correct person using two identifiers.   Pain Inventory Average Pain 8 Pain Right Now 8 My pain is constant, sharp, burning, dull, stabbing and aching  In the last 24 hours, has pain interfered with the following? General activity 8 Relation with others 7 Enjoyment of life 7 What TIME of day is your pain at its worst? daytime Sleep (in general) Poor  Pain is worse with: walking, bending, standing and some activites Pain improves with: rest and medication Relief from Meds: 7  Mobility walk without assistance use a cane use a walker ability to climb steps?  yes do you drive?  yes  Function disabled: date disabled 2007 I need assistance with the following:  dressing, bathing, meal prep, household duties and shopping  Neuro/Psych trouble walking  Prior Studies Any changes since last visit?  no  Physicians involved in your care Any changes since last visit?  no   Family History   Problem Relation Age of Onset  . Depression Mother   . Hypertension Mother   . Hypertension Father   . Heart failure Father   . Diabetes Father   . Heart attack Father   . Colon cancer Neg Hx   . Esophageal cancer Neg Hx   . Stomach cancer Neg Hx   . Rectal cancer Neg Hx    Social History   Socioeconomic History  . Marital status: Married    Spouse name: [name]  . Number of children: 1  . Years of education: some college  . Highest education level: Not on file  Occupational History  . Occupation: On disability    Employer: UNEMPLOYED  Social Needs  . Financial resource strain: Not on file  . Food insecurity:    Worry: Not on file    Inability: Not on file  . Transportation needs:    Medical: Not on file    Non-medical: Not on file  Tobacco Use  . Smoking status: Former Smoker    Packs/day: 0.50    Types: Cigarettes    Last attempt to quit: 02/06/2016    Years since quitting: 2.4  . Smokeless tobacco: Never Used  . Tobacco comment: going to try e-sticks to quit  Substance and Sexual Activity  . Alcohol use: No    Alcohol/week: 0.0 standard drinks  . Drug use: No  . Sexual activity: Not Currently    Partners: Male  Lifestyle  . Physical activity:    Days per week:  Not on file    Minutes per session: Not on file  . Stress: Not on file  Relationships  . Social connections:    Talks on phone: Not on file    Gets together: Not on file    Attends religious service: Not on file    Active member of club or organization: Not on file    Attends meetings of clubs or organizations: Not on file    Relationship status: Not on file  Other Topics Concern  . Not on file  Social History Narrative   8-10 cups of soda a day.  On disability. No regular exercise.     Past Surgical History:  Procedure Laterality Date  . ABDOMINAL HYSTERECTOMY  May 2004  . Basel Cell Carcinoma  06/2005, 07/2005   nasal tip and reconstruction  . BREAST CYST ASPIRATION    . CARPAL TUNNEL  RELEASE  07/1993   both hands  . COLONOSCOPY    . HEMORROIDECTOMY  July 2003  . KNEE ARTHROPLASTY  09/2001, 05/2003   left knee, chondromalasia  . KNEE ARTHROSCOPY  jan 2005   Right knee, tisse release, chrondromalacia  . REPLACEMENT TOTAL KNEE  Nov 2005   Left   . THYROID LOBECTOMY  01/2005   malignant area removed from right lobe  . THYROID SURGERY     thyroid lobe removal  . TONSILLECTOMY  09/1972  . TUBAL LIGATION  Sept 1999  . Ulnar nerve entrapment  Feb 1995   left elbow   Past Medical History:  Diagnosis Date  . Allergy   . Anxiety   . Cancer ([place])    cancerous nodules on thyroid  . Cancer ([place])    basal cell Cancer-nose  . Chronic pain syndrome   . COPD (chronic obstructive pulmonary disease) ([place])   . Degeneration of lumbar or lumbosacral intervertebral disc   . Depression   . Facet syndrome, lumbar   . GERD (gastroesophageal reflux disease)   . Hyperlipidemia   . Intervertebral lumbar disc disorder with myelopathy, lumbar region   . Lumbosacral spondylosis without myelopathy   . Migraine without aura, with intractable migraine, so stated, without mention of status migrainosus   . Primary localized osteoarthrosis, lower leg   . Thyroid disease    hypothyroid  . Unspecified musculoskeletal disorders and symptoms referable to neck    cervical/trapezius   Ht 5\' 8"  (1.727 m)   Wt 250 lb (113.4 kg)   BMI 38.01 kg/m   Opioid Risk Score:   Fall Risk Score:  `1  Depression screen PHQ 2/9  Depression screen [facility] 2/9 06/07/2018 01/28/2018 01/28/2018 10/11/2017 10/25/2016 08/25/2016 07/14/2016  Decreased Interest 3 3 3 3 3 3 3   Down, Depressed, Hopeless 3 3 3 3 3 3 3   PHQ - 2 Score 6 6 6 6 6 6 6   Altered sleeping - 3 3 3  - - -  Tired, decreased energy - 3 3 3  - - -  Change in appetite - 3 3 2  - - -  Feeling bad or failure about yourself  - 3 3 3  - - -  Trouble concentrating - 1 1 3  - - -  Moving slowly or fidgety/restless - 0 0 0 - - -  Suicidal thoughts - 2 2 0 - - -   PHQ-9 Score - 21 21 20  - - -  Difficult doing work/chores - Extremely dIfficult Extremely dIfficult Extremely dIfficult - - -  Some encounter information is confidential and restricted. Go to  Review Flowsheets activity to see all data.  Some recent data might be hidden     Review of Systems  Constitutional: Negative.   HENT: Negative.   Eyes: Negative.   Respiratory: Negative.   Cardiovascular: Negative.   Gastrointestinal: Negative.   Endocrine: Negative.   Genitourinary: Negative.   Musculoskeletal: Positive for arthralgias, back pain and myalgias.  Skin: Negative.   Allergic/Immunologic: Negative.   Neurological: Negative.   Hematological: Negative.   Psychiatric/Behavioral: Negative.   All other systems reviewed and are negative.      Objective:   Physical Exam Vitals signs and nursing note reviewed.  Musculoskeletal:     Comments: No Physical Exam Performed: Virtual Visit.  Neurological:     Mental Status: She is oriented to person, place, and time.           Assessment & Plan:  1.Chronic Bilateral Thoracic Back Pain/ Low Back Pain/ Lumbar Facet Arthropathy:RefilledMSIR15mg  one tablet every 6 hoursas needed #120and MS Contin100 mg every 12 hours #60. Continue Gabapentin and Pamelor.07/08/2018 We will continue the opioid monitoring program, this consists of regular clinic visits, examinations, urine drug screen, pill counts as well as use of [place] Controlled Substance Reporting System. 2. Degenerative Disk Disease:Encouraged Continue HEP as Tolertatedand heat therapy. Continue Current Medication Regime.07/08/2018. 3.Bilateral Greater Trochanteric Bursitis: Continuecurrent treatment regimenwith Heat and Ice Therapy.07/08/2018. 4. Osteoarthritis of Bilateral Knees: Continue current treatment modality with homeexerciseprogramand heat therapy. 07/08/2018. 5. Migraine Headaches:Continue withMigraine Journal.Continue:  [place]. Continue to Monitor.07/08/2018 6. Smoking Cessation: She has quit smokingsince 08/25/2016. Continue to Monitor.07/08/2018 7.Constipation: ContinueCurrent medication regime withLinzess.07/08/2018 8. Muscle Spasm: Continuecurrent medication regime withTizanidine.07/08/2018 9. DepressionAnxiety/ Panic Attacks: Continuecurrent medication regimen and treatment modality.Celexa.[place] Counseling with [name] andDr.[place] Psychiatrist.07/08/2018 10. RSD: Continue with current medication Regimewith Gabapentin.07/08/2018  Telephone Call  Location of patient:Home Location of provider: Office Total Time Spent: 10 Minutes.  F/U in40month

## 2018-07-19 ENCOUNTER — Other Ambulatory Visit: Payer: Self-pay

## 2018-07-19 ENCOUNTER — Ambulatory Visit (INDEPENDENT_AMBULATORY_CARE_PROVIDER_SITE_OTHER): Payer: Medicare Other | Admitting: Licensed Clinical Social Worker

## 2018-07-19 DIAGNOSIS — G894 Chronic pain syndrome: Secondary | ICD-10-CM

## 2018-07-19 DIAGNOSIS — M5136 Other intervertebral disc degeneration, lumbar region: Secondary | ICD-10-CM

## 2018-07-19 DIAGNOSIS — F331 Major depressive disorder, recurrent, moderate: Secondary | ICD-10-CM

## 2018-07-19 DIAGNOSIS — F411 Generalized anxiety disorder: Secondary | ICD-10-CM | POA: Diagnosis not present

## 2018-07-19 NOTE — Progress Notes (Signed)
THERAPIST PROGRESS NOTE  Session Time: 8:01 AM to 9:00 AM  Participation Level: Active  Behavioral Response: CasualAlertappropriate  Type of Therapy: Individual Therapy  Treatment Goals addressed:  Decrease in depression, anxiety and panic Interventions: Solution Focused, Strength-based, Supportive, Reframing and Other: coping  Summary: [name] is a 58 y.o. female who presents with poor sleep and shares my mom said me and my daughter were vampires where we were up all night and slept all day.  Explains now I hurt all the time so I sleep when I can,  Two hours in a row it is great. Pain wakes me up and knees feel like teeth biting into them.  Pain is there all the time and sometimes it gets worse. The knees are the worst of it I have pain everywhere else.  Reviewed status of anxiety symptoms and patient shares that  weaning me off of morphine and scared to death, hurting all the time and then they are going to take it away from it. I dread that it is going to get worse when they take medications away. I am allergic to all the other  stuff. Shares that doctors have told her that she is on a lot of pain medication. Anxiety worse taking away medications. I don't know if I can take it anymore. I have been give nothing for anxiety and panic.  Medications and explained that her Celexa also helps for anxiety and panic.  Patient relates I think he told me that it helps with pain.  It is not doing anything on that hand.  I have been on this for for 5 years.  This is also an antidepressant and patient shares on a antidepressant and it isn't working. If it does I am not aware the dog does more than that.  Reviewed that Neurontin it is helpful as a mood stabilizer and helps with pain.  Courage patient to discuss with Dr. about changing medications. Reviewed what doctor's told her that may help.  Relates they have explained the stuff is already is in my body and if take medicine away it kicks in  to start the thing in my body to start working again. Sounds like baloney.  Therapist reframed persepctive (see below) I have an ice pack on knees and heating pad on back. I am doing it more? I can't live with ice packs around knee. I can't get up and they don't stay there. It means I trapped.  Natural opiates never worked before. I can't remember when I didn't have pain. The first knee surgery started it all. Healed up but the pain didn't go away and spread. It felt like lighter fluid on me. Then went to [facility] orthopedic surgery. Did surgery on the other knee. Did a lateral tissue release on both sides, didn't help. Pain in knees and spreading. Left knee hurting and he did a partial replacement along with two other parts. He said it would last 10 years and have to do it again. They didn't understand why she was having pain. Sent to pain doctors. Dr. Naaman Plummer took over from who was before. Been with him 10 12 years.  I have diagnosed with reflux sympathetic dystrophy-RSD-psychosomatic that it is in her head but It isn't in my head it is in my knee. Describe the approach for this is that they treat symptoms. Arthritis in both hands and both elbows and back, DDD in back. Point in back like a cigarette lighter on it all the  time and spreads out to left shoulder. Injections help sometimes and not and I think that it is weird. Sometimes I get real relief from that spot for awhile We keep trying.  Migraines battling for years. I have had Aimovit injection. New medicine for weeks and I can't tell any difference. Botox injections didn't help.  Reviewed repressed emotions-could be I expect I suppress a lot, it has not been easy. Not sure if it would help to talk about it. Rough times with mom. My favorite place is water. Has silver sneakers and could go to any gym. I don't have anybody to go with.   Reviewed treatment plan and patient said 1 of her strengths used to be her brain of has lost some of this ability.   Discussed that she can exercise the brain to improve it.  Shares I play games and scratches, quizzes and trivia from [employer]. I do IQ test. I do it every day. 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 Meet neighbors and we talk about all kinds of things. Did things for neighborhood kids.   Suicidal/Homicidal: No  Therapist Response: Therapist assessed patient current functioning per report and processed patient's feelings related to stressor of pain management plan to decrease medication.  Identify this as a significant source of anxiety.  Utilize reframing by reinforcing patient giving this a chance as it is a recommendation by the doctor and if it does not work that the doctors will work with her to make sure pain is managed.   Explored activities that would be helpful for pain.  Identified swimming as something patient loves, will continue to work through roadblocks with patient which is that she does not have anybody to go with. Reviewed memories from childhood to help with increased of positive mood. Validated patient on how she was feeling Uncovered doctors evaluation that symptoms related to psychosomatic origin.  Discussed with patient approach and working in therapy includes, releasing repressed memories, working through unresolved conflict that may be unconscious.  Will continue to utilize motivational interventions to implement strategies that could alleviate symptoms. Reframe per patient her belief of cognitive abilities that have lessened sharing on an ongoing basis she can do activities to help improve cognition and identified ones that patient is doing including doing puzzles, IQ test, exercises for her brain through [facility].  Discussed that social engagement also helps with enhancing cognition and identifies she is doing it in therapy and also through that will help with neighborhood out where she engages with neighbors.  Pointed out this is also helpful for  mood to connect with supports, also that they do charitable work which is another activity encouraged by positive psychology that will enhance mood.  Completed treatment plan and patient gave verbal consent to complete plan virtually Plan: Return again in 3 weeks.2.  Therapist encourage patient with healthy coping, utilize interventions for psychosomatic symptoms as appropriate, work with patient on decrease in anxiety, depression and panic  Diagnosis: Axis I: major depressive disorder, recurrent, moderate, degenerative disc disease, chronic pain syndrome, generalized anxiety disorder    Axis II: No diagnosis    [name], LCSW 07/19/2018

## 2018-08-03 ENCOUNTER — Other Ambulatory Visit: Payer: Self-pay | Admitting: Registered Nurse

## 2018-08-04 ENCOUNTER — Other Ambulatory Visit: Payer: Self-pay | Admitting: Family Medicine

## 2018-08-04 ENCOUNTER — Other Ambulatory Visit: Payer: Self-pay | Admitting: Physical Medicine & Rehabilitation

## 2018-08-06 ENCOUNTER — Ambulatory Visit (INDEPENDENT_AMBULATORY_CARE_PROVIDER_SITE_OTHER): Payer: Medicare Other | Admitting: Licensed Clinical Social Worker

## 2018-08-06 DIAGNOSIS — M5136 Other intervertebral disc degeneration, lumbar region: Secondary | ICD-10-CM

## 2018-08-06 DIAGNOSIS — F411 Generalized anxiety disorder: Secondary | ICD-10-CM | POA: Diagnosis not present

## 2018-08-06 DIAGNOSIS — F331 Major depressive disorder, recurrent, moderate: Secondary | ICD-10-CM | POA: Diagnosis not present

## 2018-08-06 DIAGNOSIS — G894 Chronic pain syndrome: Secondary | ICD-10-CM | POA: Diagnosis not present

## 2018-08-06 NOTE — Progress Notes (Signed)
Virtual Visit via Telephone Note  I connected with [name] on 08/06/18 at  8:00 AM EDT by telephone and verified that I am speaking with the correct person using two identifiers.   I discussed the limitations, risks, security and privacy concerns of performing an evaluation and management service by telephone and the availability of in person appointments. I also discussed with the patient that there may be a patient responsible charge related to this service. The patient expressed understanding and agreed to proceed.  Follow Up Instructions:    I discussed the assessment and treatment plan with the patient. The patient was provided an opportunity to ask questions and all were answered. The patient agreed with the plan and demonstrated an understanding of the instructions.   The patient was advised to call back or seek an in-person evaluation if the symptoms worsen or if the condition fails to improve as anticipated.  I provided 52 minutes of non-face-to-face time during this encounter.   [name], LCSW    THERAPIST PROGRESS NOTE  Session Time: 8:02 AM to 8:54 AM  Participation Level: Active  Behavioral Response: CasualAlertEuthymic  Type of Therapy: Individual Therapy  Treatment Goals addressed: major depressive disorder, recurrent, moderate, degenerative disc disease, chronic pain syndrome, generalized anxiety disorder  Interventions: Solution Focused, Strength-based, Supportive and Other: coping  Summary: [name] is a 58 y.o. female who presents with fixed income have to be careful. 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 [name] (her husband) is like that. We were the house where all the kids would play. Married 19 years. Before married to a man with three daughters, eldest 58 years younger. He was older, I could relate to both of them. With my ex the had twin daughters, 8 months older then [place] (patient's daughter), like raising three. Manuela Schwartz  the older daughter was with [place] for awhile. I was with him fourteen years, would have left sooner but stayed for children to make sure they were taken care of. I haven't been alone in my whole life. I was somebody's daughter to wife. First husband [name]) married 91 days. I married to get away from mom. We were just foolish. .  Mood is the same as all time. Stressors are usual. They are trying to wean me off of pain medicine. Going to see pain doctor the 7th in person. See what they are going to do. they have slowly decreased medicine from 120 mg(so less pills) to 115 mg, they are weaning me off slowly. .  Discussed positive memories of the past, spending time in [place]. Encouraged her with re-engaging in these type of activities. When you are in pain and can't do stuff like you used to, when you are not able to do those things friends are gradually gone.I think it bBothered them to see me hurt. So many migraines that any time I had plans have a migraine that would interrupt plans. Migraine started last night, sleep for 30 or 1 hour and wake up. Haven't been without them in years. That explains why can't talk, don't get any rest. Some times too tired to sleep. Get a headache and trouble getting to sleep. I have been reading Kindle can't do when head hurts. Color when head hurts if I can't get back to what I was doing. It distracts me having pain in hips, back, knees all the time, head but not all time. Coloring helps to not pay attention to other hurts but  can't go to sleep when it is like that. Will be up 2-3 nights in a row. When I sleep it will be for 2 days. GIl (husband) saves the coloring books and has hung a couple of them.  Discussed the helpfulness of creative activities for mood, self satisfying and feeling of accomplishment helps with positive feelings about self.      Suicidal/Homicidal: No  Therapist Response: Therapist assessed patient current functioning per report, updated to symptoms and  processed feelings related to stressors.  Validated patient's feelings related to stressors.  Reviewed positive memories from the past as a mood enhancing strategy, living through positive experiences of the past has the ability to create positive mood through reexperiencing through memory, also helped in identifying her strengths.. Encourage behaviors with activities that would help with pain, ones that distract her from pain,  and provided positive feedback to encourage patient in continuing these behaviors. Encouraged patient in activities she enjoys, including ones that are active to help improve mood.  Provided supportive and strength-based intervention.   Plan: Return again in 2 weeks.2.  Therapist continue to work with patient on coping strategies for pain management, mood regulation as well as supportive and strength-based interventions  Diagnosis: Axis I: major depressive disorder, recurrent, moderate, degenerative disc disease, chronic pain syndrome, generalized anxiety disorder    Axis II: No diagnosis    [name], LCSW 08/06/2018

## 2018-08-13 ENCOUNTER — Encounter: Payer: Medicare Other | Admitting: Registered Nurse

## 2018-08-20 ENCOUNTER — Encounter: Payer: Self-pay | Admitting: Registered Nurse

## 2018-08-20 ENCOUNTER — Ambulatory Visit (INDEPENDENT_AMBULATORY_CARE_PROVIDER_SITE_OTHER): Payer: Medicare Other | Admitting: Licensed Clinical Social Worker

## 2018-08-20 ENCOUNTER — Encounter: Payer: Medicare Other | Attending: Physical Medicine and Rehabilitation | Admitting: Registered Nurse

## 2018-08-20 ENCOUNTER — Other Ambulatory Visit: Payer: Self-pay

## 2018-08-20 VITALS — BP 137/84 | HR 100 | Temp 97.6°F | Resp 14 | Ht 68.5 in | Wt 260.0 lb

## 2018-08-20 DIAGNOSIS — M1711 Unilateral primary osteoarthritis, right knee: Secondary | ICD-10-CM | POA: Diagnosis not present

## 2018-08-20 DIAGNOSIS — Z5181 Encounter for therapeutic drug level monitoring: Secondary | ICD-10-CM

## 2018-08-20 DIAGNOSIS — G894 Chronic pain syndrome: Secondary | ICD-10-CM | POA: Diagnosis not present

## 2018-08-20 DIAGNOSIS — M5136 Other intervertebral disc degeneration, lumbar region: Secondary | ICD-10-CM

## 2018-08-20 DIAGNOSIS — Z760 Encounter for issue of repeat prescription: Secondary | ICD-10-CM | POA: Insufficient documentation

## 2018-08-20 DIAGNOSIS — M1712 Unilateral primary osteoarthritis, left knee: Secondary | ICD-10-CM

## 2018-08-20 DIAGNOSIS — F411 Generalized anxiety disorder: Secondary | ICD-10-CM | POA: Diagnosis not present

## 2018-08-20 DIAGNOSIS — M47816 Spondylosis without myelopathy or radiculopathy, lumbar region: Secondary | ICD-10-CM

## 2018-08-20 DIAGNOSIS — F329 Major depressive disorder, single episode, unspecified: Secondary | ICD-10-CM

## 2018-08-20 DIAGNOSIS — G8929 Other chronic pain: Secondary | ICD-10-CM

## 2018-08-20 DIAGNOSIS — G43119 Migraine with aura, intractable, without status migrainosus: Secondary | ICD-10-CM

## 2018-08-20 DIAGNOSIS — M7061 Trochanteric bursitis, right hip: Secondary | ICD-10-CM | POA: Diagnosis not present

## 2018-08-20 DIAGNOSIS — M546 Pain in thoracic spine: Secondary | ICD-10-CM

## 2018-08-20 DIAGNOSIS — Z79899 Other long term (current) drug therapy: Secondary | ICD-10-CM

## 2018-08-20 DIAGNOSIS — M7062 Trochanteric bursitis, left hip: Secondary | ICD-10-CM

## 2018-08-20 DIAGNOSIS — F331 Major depressive disorder, recurrent, moderate: Secondary | ICD-10-CM | POA: Diagnosis not present

## 2018-08-20 DIAGNOSIS — G9050 Complex regional pain syndrome I, unspecified: Secondary | ICD-10-CM

## 2018-08-20 MED ORDER — MORPHINE SULFATE 15 MG PO TABS: 15.0000 mg | ORAL_TABLET | Freq: Four times a day (QID) | ORAL | 0 refills | Status: DC | PRN

## 2018-08-20 MED ORDER — MORPHINE SULFATE ER 100 MG PO TBCR: 100.0000 mg | EXTENDED_RELEASE_TABLET | Freq: Two times a day (BID) | ORAL | 0 refills | Status: DC

## 2018-08-20 NOTE — Progress Notes (Signed)
Subjective:    Patient ID: [name], female    DOB: 27-Jul-1960, 58 y.o.   MRN: [id number]  HPI: [name] is a 58 y.o. female who returns for follow up appointment for chronic pain and medication refill. She states her pain is located in her neck, upper- lower back, bilateral hips and bilateral knees. She rates her pain 9. Her current exercise regime is walking and performing chair exercises.  [name] Morphine equivalent is 257.50  MME.  Last UDS was performed on 11/19/2017, it was consistent.   Pain Inventory Average Pain 9 Pain Right Now 9 My pain is sharp, burning, dull, stabbing, tingling and aching  In the last 24 hours, has pain interfered with the following? General activity 10 Relation with others 10 Enjoyment of life 10 What TIME of day is your pain at its worst? n/a Sleep (in general) Poor  Pain is worse with: walking, bending, standing and some activites Pain improves with: rest, heat/ice, medication, TENS and injections Relief from Meds: 6  Mobility use a cane use a walker  Function not employed: date last employed 2005 disabled: date disabled 2001 I need assistance with the following:  bathing, meal prep, household duties and shopping  Neuro/Psych weakness numbness tingling trouble walking spasms dizziness confusion depression anxiety  Prior Studies no  Physicians involved in your care no   Family History  Problem Relation Age of Onset  . Depression Mother   . Hypertension Mother   . Hypertension Father   . Heart failure Father   . Diabetes Father   . Heart attack Father   . Colon cancer Neg Hx   . Esophageal cancer Neg Hx   . Stomach cancer Neg Hx   . Rectal cancer Neg Hx    Social History   Socioeconomic History  . Marital status: Married    Spouse name: [name]  . Number of children: 1  . Years of education: some college  . Highest education level: Not on file  Occupational History  . Occupation: On  disability    Employer: UNEMPLOYED  Social Needs  . Financial resource strain: Not on file  . Food insecurity    Worry: Not on file    Inability: Not on file  . Transportation needs    Medical: Not on file    Non-medical: Not on file  Tobacco Use  . Smoking status: Former Smoker    Packs/day: 0.50    Types: Cigarettes    Quit date: 02/06/2016    Years since quitting: 2.5  . Smokeless tobacco: Never Used  . Tobacco comment: going to try e-sticks to quit  Substance and Sexual Activity  . Alcohol use: No    Alcohol/week: 0.0 standard drinks  . Drug use: No  . Sexual activity: Not Currently    Partners: Male  Lifestyle  . Physical activity    Days per week: Not on file    Minutes per session: Not on file  . Stress: Not on file  Relationships  . Social Herbalist on phone: Not on file    Gets together: Not on file    Attends religious service: Not on file    Active member of club or organization: Not on file    Attends meetings of clubs or organizations: Not on file    Relationship status: Not on file  Other Topics Concern  . Not on file  Social History Narrative   8-10 cups of soda  a day.  On disability. No regular exercise.     Past Surgical History:  Procedure Laterality Date  . ABDOMINAL HYSTERECTOMY  May 2004  . Basel Cell Carcinoma  06/2005, 07/2005   nasal tip and reconstruction  . BREAST CYST ASPIRATION    . CARPAL TUNNEL RELEASE  07/1993   both hands  . COLONOSCOPY    . HEMORROIDECTOMY  July 2003  . KNEE ARTHROPLASTY  09/2001, 05/2003   left knee, chondromalasia  . KNEE ARTHROSCOPY  jan 2005   Right knee, tisse release, chrondromalacia  . REPLACEMENT TOTAL KNEE  Nov 2005   Left   . THYROID LOBECTOMY  01/2005   malignant area removed from right lobe  . THYROID SURGERY     thyroid lobe removal  . TONSILLECTOMY  09/1972  . TUBAL LIGATION  Sept 1999  . Ulnar nerve entrapment  Feb 1995   left elbow   Past Medical History:  Diagnosis Date  .  Allergy   . Anxiety   . Cancer ([place])    cancerous nodules on thyroid  . Cancer ([place])    basal cell Cancer-nose  . Chronic pain syndrome   . COPD (chronic obstructive pulmonary disease) ([place])   . Degeneration of lumbar or lumbosacral intervertebral disc   . Depression   . Facet syndrome, lumbar   . GERD (gastroesophageal reflux disease)   . Hyperlipidemia   . Intervertebral lumbar disc disorder with myelopathy, lumbar region   . Lumbosacral spondylosis without myelopathy   . Migraine without aura, with intractable migraine, so stated, without mention of status migrainosus   . Primary localized osteoarthrosis, lower leg   . Thyroid disease    hypothyroid  . Unspecified musculoskeletal disorders and symptoms referable to neck    cervical/trapezius   BP 137/84   Pulse (!) 103   Temp 97.6 F (36.4 C)   Resp 14   Ht 5' 8.5" (1.74 m)   Wt 260 lb (117.9 kg)   SpO2 98%   BMI 38.96 kg/m   Opioid Risk Score:   Fall Risk Score:  `1  Depression screen PHQ 2/9  Depression screen [facility] 2/9 06/07/2018 01/28/2018 01/28/2018 10/11/2017 10/25/2016 08/25/2016 07/14/2016  Decreased Interest 3 3 3 3 3 3 3   Down, Depressed, Hopeless 3 3 3 3 3 3 3   PHQ - 2 Score 6 6 6 6 6 6 6   Altered sleeping - 3 3 3  - - -  Tired, decreased energy - 3 3 3  - - -  Change in appetite - 3 3 2  - - -  Feeling bad or failure about yourself  - 3 3 3  - - -  Trouble concentrating - 1 1 3  - - -  Moving slowly or fidgety/restless - 0 0 0 - - -  Suicidal thoughts - 2 2 0 - - -  PHQ-9 Score - 21 21 20  - - -  Difficult doing work/chores - Extremely dIfficult Extremely dIfficult Extremely dIfficult - - -  Some encounter information is confidential and restricted. Go to Review Flowsheets activity to see all data.  Some recent data might be hidden      Review of Systems  All other systems reviewed and are negative.      Objective:   Physical Exam Constitutional:      Appearance: Normal appearance.  Neck:      Musculoskeletal: Normal range of motion and neck supple.     Comments: Cervical Paraspinal Tenderness: C-5-C-6 Cardiovascular:  Rate and Rhythm: Normal rate and regular rhythm.     Pulses: Normal pulses.     Heart sounds: Normal heart sounds.  Pulmonary:     Effort: Pulmonary effort is normal.     Breath sounds: Normal breath sounds.  Musculoskeletal:     Comments: Normal Muscle Bulk and Muscle Testing Reveals:  Upper Extremities: Full  ROM and Muscle Strength 5/5 Thoracic Paraspinal Tenderness: T-7-T-9  Lumbar Paraspinal Tenderness: L-4-L-5 Bilateral Greater Trochanter Tenderness Lower Extremities: Decreased ROM and Muscle Strength 4/5  Bilateral Lower Extremities Flexion Produces Pain into Bilateral Patella's Arises from Table slowly using cane for support Antalgic  Gait   Skin:    General: Skin is warm and dry.  Neurological:     Mental Status: She is alert and oriented to person, place, and time.  Psychiatric:        Mood and Affect: Mood normal.        Behavior: Behavior normal.           Assessment & Plan:  1.Chronic Bilateral Thoracic Back Pain/ Low Back Pain/ Lumbar Facet Arthropathy:RefilledMSIR15mg  one tablet every 6 hoursas needed #120and MS Contin100 mg every 12 hours #60.Continue Gabapentin and Pamelor.08/20/2018 We will continue the opioid monitoring program, this consists of regular clinic visits, examinations, urine drug screen, pill counts as well as use of [place] Controlled Substance Reporting System. 2. Degenerative Disk Disease:Encouraged Continue HEP as Tolertatedand heat therapy. Continue Current Medication Regime.08/20/2018. 3.Bilateral Greater Trochanteric Bursitis: Continuecurrent treatment regimenwith Heat and Ice Therapy.08/20/2018. 4. Osteoarthritis of Bilateral Knees: Continue current treatment modality with homeexerciseprogramand heat therapy. 08/20/2018. 5. Migraine Headaches:Continue withMigraine Journal.Continue:  Aimovig,LamictalandMaxalt. Continue to Monitor.08/20/2018 6. Smoking Cessation: She has quit smokingsince 08/25/2016. Continue to Monitor.08/20/2018 7.Constipation: ContinueCurrent medication regime withLinzess.08/20/2018 8. Muscle Spasm: Continuecurrent medication regime withTizanidine.08/20/2018 9. DepressionAnxiety/ Panic Attacks: Continuecurrent medication regimen and treatment modality.Celexa.Lamictaland Counseling with [name] andDr.[place]ht[place] Psychiatrist.08/20/2018 10. RSD: Continue with current medication Regimewith Gabapentin.08/20/2018  20 minutes of face to face patient care time was spent during this visit. All questions were encouraged and answered.  F/U in 1 month

## 2018-08-20 NOTE — Progress Notes (Signed)
Mrs Gray Bernhardt brought in her Aimovig 70mg /ml auto injector to her appointment and asked to be shown how to give the medication.  She has had two previous mishaps at home so this will be officially her first dose.  I showed her how to prep the skin and then remove the white cap when she is ready to inject. (I gave her this injection). The autoinjector was placed on her left thigh and pressed down into the skin as directed. I pressed the purple button on the top while holding it pressed down into the skin. After about 15 seconds a click was heard and the medication window was yellow showing the medication had been dispensed. I removed the autoinjector and showed her the window. The injector was disposed in the sharps box.  No bleeding from the site.  Apparently the last time she tried she placed the end with the purple button on the skin and pressed down causing the medication to shoot out into the air.  She says she understands now and will have her husband give it to her as she "can't do that to herself". I gave her the printout from the box with pictures of how to inject to take home with her.  I have instructed her if she starts feeling bad she will need to go to urgent care to be evaluated because she state she has a lot of allergies and did not respond well to imitrex.  She did not exhibit any signs of reaction and she was allowed to check out.

## 2018-08-20 NOTE — Progress Notes (Signed)
Virtual Visit via Telephone Note  I connected with [name] on 08/20/18 at  8:00 AM EDT by telephone and verified that I am speaking with the correct person using two identifiers.   I discussed the limitations, risks, security and privacy concerns of performing an evaluation and management service by telephone and the availability of in person appointments. I also discussed with the patient that there may be a patient responsible charge related to this service. The patient expressed understanding and agreed to proceed.  Follow Up Instructions:    I discussed the assessment and treatment plan with the patient. The patient was provided an opportunity to ask questions and all were answered. The patient agreed with the plan and demonstrated an understanding of the instructions.   The patient was advised to call back or seek an in-person evaluation if the symptoms worsen or if the condition fails to improve as anticipated.  I provided 52 minutes of non-face-to-face time during this encounter.   THERAPIST PROGRESS NOTE  Session Time: 8:01 AM to 8:53 AM  Participation Level: Active  Behavioral Response: CasualAlertlow mood due to pain issues  Type of Therapy: Individual Therapy  Treatment Goals addressed:  Decrease in depression, anxiety and panic  Interventions: Solution Focused, Strength-based, Supportive and Other: coping  Summary: LAURAMAE KNEISLEY is a 58 y.o. female who presents with appointment today at pain and rehabilitative clinic to see Dr.  Tessa Lerner.  Shares not feeling well right now due to pain issues.  Relates I hate that the opiate thing is making him do what it is doing.  I am on a 12-hour routine, the body expects the opiate to manage the pain even though still has pain to manage.  Moved the time for the report and the pain kicks in earlier and takes longer to get on top of it.  She has to keep her feet elevated to cut down on swelling, heating pad on her back,  hips, ice pack on her knees, sometimes on her head to deal with headaches.  Describes freezing and sweating at the same time in uses the word "ridiculous". recognizes that doctors plan is to have natural opiates take over that were unable to do so when taking pain meds, as pain meds took over to do their job but describes a struggle of new plan and not sure if it is going to work.The plan is to be eventually off drugs body remembers to make natural opiates and I won't hurt so bad. Asks how much am I going to have to go through. "What else can you do but laugh". Nothing she can do to change what she is going through  "I never used to be a cry baby until this.  She is changing into somebody she does not recognize.  Therapist keeps reinforcing patient's strengths, and session patient was able to provide jokes and had both therapist and patient laughing. Relates she does what she can to manage pain such as distracting by coloring, playing with her emotional support dog, Petrone.   Never a cry baby until this. Change into something I don't recognize.  Shared that can be difficult as husband, [name], does not hear well so he yells and then he will say that I am yelling at him.  Can be annoying.  Patient shared positive memories with therapist, today about animals that have been important in her life including Redmond Pulling, a rescue cat that her husband got for her, sugar baby, Pekingese she had an  87.  Shared about her positive experiences with next door and nap and ways that neighbors have been helpful to each other.  Suicidal/Homicidal: No  Therapist Response: Therapist assessed patient's current functioning per report and processed feelings related to significant stressor pain issues.  Therapist offered supportive intervention mentions as a treatment intervention for pain issues.  Reviewed other coping strategies for patient that include humor, a positive frame of reference that helps her to focus her thoughts away from  negative thoughts that come from pain issues.  Reinforced patient's own insight to coping including finding distractions that are helpful such as coloring, playing with her emotional support animal, Petron.  Continue to encourage patient with helpful perspectives in regards to treatment for pain issues.  In session discussed interests of patient that again help her to focus on positive parts of her life.  In therapy humor was used as a treatment intervention.  Provided strength based and supportive interventions.  Plan: Return again in 2 weeks.2.Therapist continue to work with patient on coping strategies for pain management, mood regulation as well as supportive and strength-based interventions  Diagnosis: Axis I:  .major depressive disorder, recurrent, moderate, degenerative disc disease, chronic pain syndrome, generalized anxiety disorder     Axis II: No diagnosis    [name], LCSW 08/20/2018

## 2018-08-27 ENCOUNTER — Ambulatory Visit (INDEPENDENT_AMBULATORY_CARE_PROVIDER_SITE_OTHER): Payer: Medicare Other | Admitting: Psychiatry

## 2018-08-27 ENCOUNTER — Encounter (HOSPITAL_COMMUNITY): Payer: Self-pay | Admitting: Psychiatry

## 2018-08-27 DIAGNOSIS — G894 Chronic pain syndrome: Secondary | ICD-10-CM

## 2018-08-27 DIAGNOSIS — M5136 Other intervertebral disc degeneration, lumbar region: Secondary | ICD-10-CM | POA: Diagnosis not present

## 2018-08-27 DIAGNOSIS — F411 Generalized anxiety disorder: Secondary | ICD-10-CM | POA: Diagnosis not present

## 2018-08-27 DIAGNOSIS — F331 Major depressive disorder, recurrent, moderate: Secondary | ICD-10-CM | POA: Diagnosis not present

## 2018-08-27 NOTE — Progress Notes (Signed)
[facility] Outpatient Follow up visit  Patient Identification: [name] MRN:  [id number] Date of Evaluation:  08/27/2018 Referral Source: primary care Chief Complaint:   depression follow up  Visit Diagnosis:    ICD-10-CM   1. Major depressive disorder, recurrent episode, moderate (HCC)  F33.1   2. Chronic pain syndrome  G89.4   3. GAD (generalized anxiety disorder)  F41.1   4. Degenerative disc disease, lumbar  M51.36     History of Present Illness:  58 years old white married female. Referred by pain clinic for counselling and depression  Patient suffers from chronic back condition multiple pain conditions including at the joints surgery in the past including knee replacement.  She takes morphine 4 times a day also on gabapentin.  She takes Lamictal for migraine referred for possible counseling I connected with [name] on 08/27/18 at  8:30 AM EDT by telephone and verified that I am speaking with the correct person using two identifiers.   I discussed the limitations, risks, security and privacy concerns of performing an evaluation and management service by telephone and the availability of in person appointments. I also discussed with the patient that there may be a patient responsible charge related to this service. The patient expressed understanding and agreed to proceed.  Pain effects mood and depression. Primary care giving celexa for depression It does what it can. Mobility is limited but depression is manageable Modifying factor: husband  Tolerating medications.  No rash Duration : more then 20 years  Aggravating F:multiple medical conditions , arthirits, Degenerative joints disease. Chronic back condition, migraine Limited movements   Past Psychiatric History: depression, anxiety, chronic pain  Previous Psychotropic Medications: Yes   Substance Abuse History in the last 12 months:  No.  On pain meds as prescribed by provider for arthritis  Past  Medical History:  Past Medical History:  Diagnosis Date  . Allergy   . Anxiety   . Cancer ([place])    cancerous nodules on thyroid  . Cancer ([place])    basal cell Cancer-nose  . Chronic pain syndrome   . COPD (chronic obstructive pulmonary disease) ([place])   . Degeneration of lumbar or lumbosacral intervertebral disc   . Depression   . Facet syndrome, lumbar   . GERD (gastroesophageal reflux disease)   . Hyperlipidemia   . Intervertebral lumbar disc disorder with myelopathy, lumbar region   . Lumbosacral spondylosis without myelopathy   . Migraine without aura, with intractable migraine, so stated, without mention of status migrainosus   . Primary localized osteoarthrosis, lower leg   . Thyroid disease    hypothyroid  . Unspecified musculoskeletal disorders and symptoms referable to neck    cervical/trapezius    Past Surgical History:  Procedure Laterality Date  . ABDOMINAL HYSTERECTOMY  May 2004  . Basel Cell Carcinoma  06/2005, 07/2005   nasal tip and reconstruction  . BREAST CYST ASPIRATION    . CARPAL TUNNEL RELEASE  07/1993   both hands  . COLONOSCOPY    . HEMORROIDECTOMY  July 2003  . KNEE ARTHROPLASTY  09/2001, 05/2003   left knee, chondromalasia  . KNEE ARTHROSCOPY  jan 2005   Right knee, tisse release, chrondromalacia  . REPLACEMENT TOTAL KNEE  Nov 2005   Left   . THYROID LOBECTOMY  01/2005   malignant area removed from right lobe  . THYROID SURGERY     thyroid lobe removal  . TONSILLECTOMY  09/1972  . TUBAL LIGATION  Sept 1999  .  Ulnar nerve entrapment  Feb 1995   left elbow    Family Psychiatric History: mom : depression  Family History:  Family History  Problem Relation Age of Onset  . Depression Mother   . Hypertension Mother   . Hypertension Father   . Heart failure Father   . Diabetes Father   . Heart attack Father   . Colon cancer Neg Hx   . Esophageal cancer Neg Hx   . Stomach cancer Neg Hx   . Rectal cancer Neg Hx     Social History:   Social  History   Socioeconomic History  . Marital status: Married    Spouse name: [name]  . Number of children: 1  . Years of education: some college  . Highest education level: Not on file  Occupational History  . Occupation: On disability    Employer: UNEMPLOYED  Social Needs  . Financial resource strain: Not on file  . Food insecurity    Worry: Not on file    Inability: Not on file  . Transportation needs    Medical: Not on file    Non-medical: Not on file  Tobacco Use  . Smoking status: Former Smoker    Packs/day: 0.50    Types: Cigarettes    Quit date: 02/06/2016    Years since quitting: 2.5  . Smokeless tobacco: Never Used  . Tobacco comment: going to try e-sticks to quit  Substance and Sexual Activity  . Alcohol use: No    Alcohol/week: 0.0 standard drinks  . Drug use: No  . Sexual activity: Not Currently    Partners: Male  Lifestyle  . Physical activity    Days per week: Not on file    Minutes per session: Not on file  . Stress: Not on file  Relationships  . Social Herbalist on phone: Not on file    Gets together: Not on file    Attends religious service: Not on file    Active member of club or organization: Not on file    Attends meetings of clubs or organizations: Not on file    Relationship status: Not on file  Other Topics Concern  . Not on file  Social History Narrative   8-10 cups of soda a day.  On disability. No regular exercise.        Allergies:   Allergies  Allergen Reactions  . Aspirin Other (See Comments)    Abdominal bleeding   . Ibuprofen Swelling  . Imitrex [Sumatriptan Base] Other (See Comments)    Drops BP   . Norvasc [Amlodipine Besylate] Swelling  . Nsaids Swelling  . Tape     Skin blisters under surgical tape  . Topamax [Topiramate] Other (See Comments)    Hair loss    Metabolic Disorder Labs: Lab Results  Component Value Date   HGBA1C 6.1 (H) 10/11/2017   MPG 128 10/11/2017   MPG 131 05/26/2015   No  results found for: PROLACTIN Lab Results  Component Value Date   CHOL 131 10/11/2017   TRIG 162 (H) 10/11/2017   HDL 47 (L) 10/11/2017   CHOLHDL 2.8 10/11/2017   VLDL 46 (H) 05/26/2015   LDLCALC 60 10/11/2017   LDLCALC 67 05/26/2015     Current Medications: Current Outpatient Medications  Medication Sig Dispense Refill  . citalopram (CELEXA) 40 MG tablet TAKE 1 TABLET(40 MG) BY MOUTH AT BEDTIME 30 tablet 5  . Erenumab-aooe (AIMOVIG) 70 MG/ML SOAJ Inject 70 mg into  the skin every 30 (thirty) days. 3 pen 3  . gabapentin (NEURONTIN) 800 MG tablet TAKE 1 TABLET(800 MG) BY MOUTH FOUR TIMES DAILY 120 tablet 5  . lactobacillus acidophilus & bulgar (LACTINEX) chewable tablet Chew 1 tablet by mouth 3 (three) times daily with meals.  0  . lamoTRIgine (LAMICTAL) 25 MG tablet TAKE 1 TABLET(25 MG) BY MOUTH DAILY 30 tablet 2  . levothyroxine (SYNTHROID) 25 MCG tablet TAKE 1 TABLET BY MOUTH DAILY BEFORE BREAKFAST 90 tablet 1  . LINZESS 145 MCG CAPS capsule TAKE 1 CAPSULE BY MOUTH DAILY 30 capsule 3  . morphine (MS CONTIN) 100 MG 12 hr tablet Take 1 tablet (100 mg total) by mouth every 12 (twelve) hours. 60 tablet 0  . morphine (MSIR) 15 MG tablet Take 1 tablet (15 mg total) by mouth every 6 (six) hours as needed for severe pain. 115 tablet 0  . nortriptyline (PAMELOR) 50 MG capsule TAKE 2 CAPSULES BY MOUTH AT BEDTIME 180 capsule 1  . pravastatin (PRAVACHOL) 40 MG tablet TAKE 1 TABLET BY MOUTH DAILY 90 tablet 3  . promethazine (PHENERGAN) 12.5 MG tablet TAKE 1 TABLET(12.5 MG) BY MOUTH EVERY 12 HOURS 30 tablet 2  . rizatriptan (MAXALT) 10 MG tablet TAKE 1 TABLET BY MOUTH IF NEEDED. MAY REPEAT AFTER 2 HOURS IF NEEDED 30 tablet 2  . tiZANidine (ZANAFLEX) 2 MG tablet Take 1 tablet (2 mg total) by mouth 3 (three) times daily as needed. for muscle spams 90 tablet 5   No current facility-administered medications for this visit.      Psychiatric Specialty Exam: Review of Systems  Constitutional:  Positive for malaise/fatigue.  Cardiovascular: Negative for palpitations.  Musculoskeletal: Positive for back pain and myalgias. Negative for neck pain.  Skin: Negative for rash.  Neurological: Negative for tremors.    There were no vitals taken for this visit.There is no height or weight on file to calculate BMI.  General Appearance:   Eye Contact:   Speech:  Slow  Volume:  Normal  Mood: fair  Affect: constricted  Thought Process:  Goal Directed  Orientation:  Full (Time, Place, and Person)  Thought Content:  Rumination  Suicidal Thoughts:  No  Homicidal Thoughts:  No  Memory:  Immediate;   Fair Recent;   Fair  Judgement:  Fair  Insight:  Fair  Psychomotor Activity:  Decreased  Concentration:  Concentration: Fair and Attention Span: Fair  Recall:  [employer] of Knowledge:Good  Language: Good  Akathisia:  No  Handed:  Right  AIMS (if indicated):    Assets:  Desire for Improvement  ADL's:  Intact  Cognition: WNL  Sleep:  variable    Treatment Plan Summary: Medication management and Plan as follows  MDD, moderate recurrent;  Baseline, not worse . Continue celexa for depression GADsporadic panic . Continue gabapentin, celexa  Supportive husband Has appointment with physical rehab  Chronic back pain and ARthritis: on meds by providers.  Continue supportive andCBT with therapist Has meds Fu 5-6 m. Questions addressed    I discussed the assessment and treatment plan with the patient. The patient was provided an opportunity to ask questions and all were answered. The patient agreed with the plan and demonstrated an understanding of the instructions.   The patient was advised to call back or seek an in-person evaluation if the symptoms worsen or if the condition fails to improve as anticipated.   Merian Capron, MD 7/21/20208:39 AM

## 2018-09-03 ENCOUNTER — Other Ambulatory Visit: Payer: Self-pay | Admitting: Physical Medicine & Rehabilitation

## 2018-09-06 ENCOUNTER — Other Ambulatory Visit: Payer: Self-pay

## 2018-09-06 ENCOUNTER — Ambulatory Visit (INDEPENDENT_AMBULATORY_CARE_PROVIDER_SITE_OTHER): Payer: Medicare Other | Admitting: Licensed Clinical Social Worker

## 2018-09-06 DIAGNOSIS — G894 Chronic pain syndrome: Secondary | ICD-10-CM

## 2018-09-06 DIAGNOSIS — M5136 Other intervertebral disc degeneration, lumbar region: Secondary | ICD-10-CM | POA: Diagnosis not present

## 2018-09-06 DIAGNOSIS — F411 Generalized anxiety disorder: Secondary | ICD-10-CM | POA: Diagnosis not present

## 2018-09-06 DIAGNOSIS — F331 Major depressive disorder, recurrent, moderate: Secondary | ICD-10-CM | POA: Diagnosis not present

## 2018-09-06 NOTE — Progress Notes (Signed)
Virtual Visit via Telephone Note  I connected with [name] on 09/06/18 at  8:00 AM EDT by telephone and verified that I am speaking with the correct person using two identifiers.   I discussed the limitations, risks, security and privacy concerns of performing an evaluation and management service by telephone and the availability of in person appointments. I also discussed with the patient that there may be a patient responsible charge related to this service. The patient expressed understanding and agreed to proceed.  Follow Up Instructions:    I discussed the assessment and treatment plan with the patient. The patient was provided an opportunity to ask questions and all were answered. The patient agreed with the plan and demonstrated an understanding of the instructions.   The patient was advised to call back or seek an in-person evaluation if the symptoms worsen or if the condition fails to improve as anticipated.  I provided 52 minutes of non-face-to-face time during this encounter.  THERAPIST PROGRESS NOTE  Session Time: 8:02 AM to 8:54 AM  Participation Level: Active  Behavioral Response: CasualAlertEuphoric  Type of Therapy: Individual Therapy  Treatment Goals addressed: Decrease in depression, anxiety and panic  Interventions: Solution Focused, Strength-based, Supportive, Reframing and Other: coping  Summary: DORYCE MCGREGORY is a 58 y.o. female who presents with getting presents ready for adopted daughter and her daughter.  Reflected on getting older and coming to realize to make the most of you time.  Patient shares "enjoy the things you can". Miss the things we used to do as a couple, missed the things I used to be able to do in the house such as cooking. Can't stand on my feet for very long.  Therapist focus on some other things that she can still enjoy even with limitations.  Patient shares that she does [place] in the morning and  afternoon she colors.    Discussed relationship with her husband, it was so different. Now don't have a life and he too now that he has COPD.  Brought up her mom and shared that she would say anything to put me down. Had terrible times with her pregnancy, post partem depression, grand mom looked out for me first 6 months of my life that is when you bond. We (Mom and patient) weren't close but able to do things other family members couldn't do. Artsy, crafty. Loved my mom because mom but never friends. Everything I did rubbed her the wrong way, no matter how hard I pleased her it was never good enough. It never stopped to death bed. Could remember everything I did wrong, tongue like a whip, flay your skin off of you, would say these things in front of other people. Impacted self-esteem, everything. Now I have turned into her. Mom always had health issues, some from me being born, I brought home different illnesses that the family then got. Mom had a  nervous breakdown when I was 9-10, no memory of that.    Suicidal/Homicidal: No  Therapist Response: Therapist assessed patient's current functioning per report and processed feelings related to stressors as well as family history, reflecting with patient on how it has impacted her life. Reinforced point that patient brought up of enjoying the things you can and talked about some of the things patient can still enjoy. Provided patient with ongoing emotional support and encouragement.  Reinforced the importance of client staying focused on her own strengths and resources and resiliency. Processed various strategies for  dealing with stressors. Processed feelings related to losses created by physical limitations and at the same time discussed activities of daily life to shift to a positive focus   Plan: Return again in 2-3 weeks.  Diagnosis: Axis I: major depressive disorder, recurrent, moderate, degenerative disc disease, chronic pain syndrome, generalized  anxiety disorder    Axis II: No diagnosis    [name], LCSW 09/06/2018

## 2018-09-13 ENCOUNTER — Encounter: Payer: Medicare Other | Attending: Physical Medicine and Rehabilitation | Admitting: Registered Nurse

## 2018-09-13 ENCOUNTER — Encounter: Payer: Self-pay | Admitting: Registered Nurse

## 2018-09-13 ENCOUNTER — Other Ambulatory Visit: Payer: Self-pay

## 2018-09-13 VITALS — BP 97/61 | HR 80 | Temp 97.5°F | Resp 18 | Ht 68.5 in | Wt 257.6 lb

## 2018-09-13 DIAGNOSIS — G894 Chronic pain syndrome: Secondary | ICD-10-CM

## 2018-09-13 DIAGNOSIS — M1711 Unilateral primary osteoarthritis, right knee: Secondary | ICD-10-CM

## 2018-09-13 DIAGNOSIS — Z79899 Other long term (current) drug therapy: Secondary | ICD-10-CM

## 2018-09-13 DIAGNOSIS — Z79891 Long term (current) use of opiate analgesic: Secondary | ICD-10-CM

## 2018-09-13 DIAGNOSIS — Z760 Encounter for issue of repeat prescription: Secondary | ICD-10-CM | POA: Insufficient documentation

## 2018-09-13 DIAGNOSIS — Z5181 Encounter for therapeutic drug level monitoring: Secondary | ICD-10-CM

## 2018-09-13 DIAGNOSIS — Z7951 Long term (current) use of inhaled steroids: Secondary | ICD-10-CM

## 2018-09-13 DIAGNOSIS — M1712 Unilateral primary osteoarthritis, left knee: Secondary | ICD-10-CM

## 2018-09-13 MED ORDER — MORPHINE SULFATE 15 MG PO TABS: 15.0000 mg | ORAL_TABLET | Freq: Four times a day (QID) | ORAL | 0 refills | Status: DC | PRN

## 2018-09-13 MED ORDER — MORPHINE SULFATE ER 100 MG PO TBCR: 100.0000 mg | EXTENDED_RELEASE_TABLET | Freq: Two times a day (BID) | ORAL | 0 refills | Status: DC

## 2018-09-13 NOTE — Progress Notes (Signed)
Subjective:    Patient ID: [name], female    DOB: 06-06-1960, 58 y.o.   MRN: [id number]  HPI: [name] is a 58 y.o. female who returns for follow up appointment for chronic pain and medication refill. She states her pain is located in  In her neck, mid- lower back pain, bilateral hips and right knee pain. She rates her pain 8. Her current exercise regime is walking and performing chair exercises daily.   [name] Morphine equivalent is 261.61 MME. UDS ordered today.   Pain Inventory Average Pain 8 Pain Right Now 8 My pain is constant, sharp, burning, dull, stabbing, tingling and aching  In the last 24 hours, has pain interfered with the following? General activity 10 Relation with others 10 Enjoyment of life 10 What TIME of day is your pain at its worst? daytime Sleep (in general) Fair  Pain is worse with: walking, bending, standing and some activites Pain improves with: rest, heat/ice, medication, TENS and injections Relief from Meds: 6  Mobility walk with assistance use a cane use a walker ability to climb steps?  yes do you drive?  yes  Function disabled: date disabled 2007 I need assistance with the following:  bathing, meal prep, household duties and shopping  Neuro/Psych weakness numbness tremor tingling trouble walking spasms dizziness confusion depression anxiety  Prior Studies Any changes since last visit?  yes  Physicians involved in your care Any changes since last visit?  no   Family History  Problem Relation Age of Onset  . Depression Mother   . Hypertension Mother   . Hypertension Father   . Heart failure Father   . Diabetes Father   . Heart attack Father   . Colon cancer Neg Hx   . Esophageal cancer Neg Hx   . Stomach cancer Neg Hx   . Rectal cancer Neg Hx    Social History   Socioeconomic History  . Marital status: Married    Spouse name: [name]  . Number of children: 1  . Years of education:  some college  . Highest education level: Not on file  Occupational History  . Occupation: On disability    Employer: UNEMPLOYED  Social Needs  . Financial resource strain: Not on file  . Food insecurity    Worry: Not on file    Inability: Not on file  . Transportation needs    Medical: Not on file    Non-medical: Not on file  Tobacco Use  . Smoking status: Former Smoker    Packs/day: 0.50    Types: Cigarettes    Quit date: 02/06/2016    Years since quitting: 2.6  . Smokeless tobacco: Never Used  . Tobacco comment: going to try e-sticks to quit  Substance and Sexual Activity  . Alcohol use: No    Alcohol/week: 0.0 standard drinks  . Drug use: No  . Sexual activity: Not Currently    Partners: Male  Lifestyle  . Physical activity    Days per week: Not on file    Minutes per session: Not on file  . Stress: Not on file  Relationships  . Social Herbalist on phone: Not on file    Gets together: Not on file    Attends religious service: Not on file    Active member of club or organization: Not on file    Attends meetings of clubs or organizations: Not on file    Relationship status: Not on  file  Other Topics Concern  . Not on file  Social History Narrative   8-10 cups of soda a day.  On disability. No regular exercise.     Past Surgical History:  Procedure Laterality Date  . ABDOMINAL HYSTERECTOMY  May 2004  . Basel Cell Carcinoma  06/2005, 07/2005   nasal tip and reconstruction  . BREAST CYST ASPIRATION    . CARPAL TUNNEL RELEASE  07/1993   both hands  . COLONOSCOPY    . HEMORROIDECTOMY  July 2003  . KNEE ARTHROPLASTY  09/2001, 05/2003   left knee, chondromalasia  . KNEE ARTHROSCOPY  jan 2005   Right knee, tisse release, chrondromalacia  . REPLACEMENT TOTAL KNEE  Nov 2005   Left   . THYROID LOBECTOMY  01/2005   malignant area removed from right lobe  . THYROID SURGERY     thyroid lobe removal  . TONSILLECTOMY  09/1972  . TUBAL LIGATION  Sept 1999  .  Ulnar nerve entrapment  Feb 1995   left elbow   Past Medical History:  Diagnosis Date  . Allergy   . Anxiety   . Cancer ([place])    cancerous nodules on thyroid  . Cancer ([place])    basal cell Cancer-nose  . Chronic pain syndrome   . COPD (chronic obstructive pulmonary disease) ([place])   . Degeneration of lumbar or lumbosacral intervertebral disc   . Depression   . Facet syndrome, lumbar   . GERD (gastroesophageal reflux disease)   . Hyperlipidemia   . Intervertebral lumbar disc disorder with myelopathy, lumbar region   . Lumbosacral spondylosis without myelopathy   . Migraine without aura, with intractable migraine, so stated, without mention of status migrainosus   . Primary localized osteoarthrosis, lower leg   . Thyroid disease    hypothyroid  . Unspecified musculoskeletal disorders and symptoms referable to neck    cervical/trapezius   There were no vitals taken for this visit.  Opioid Risk Score:   Fall Risk Score:  `1  Depression screen PHQ 2/9  Depression screen [facility] 2/9 06/07/2018 01/28/2018 01/28/2018 10/11/2017 10/25/2016 08/25/2016 07/14/2016  Decreased Interest 3 3 3 3 3 3 3   Down, Depressed, Hopeless 3 3 3 3 3 3 3   PHQ - 2 Score 6 6 6 6 6 6 6   Altered sleeping - 3 3 3  - - -  Tired, decreased energy - 3 3 3  - - -  Change in appetite - 3 3 2  - - -  Feeling bad or failure about yourself  - 3 3 3  - - -  Trouble concentrating - 1 1 3  - - -  Moving slowly or fidgety/restless - 0 0 0 - - -  Suicidal thoughts - 2 2 0 - - -  PHQ-9 Score - 21 21 20  - - -  Difficult doing work/chores - Extremely dIfficult Extremely dIfficult Extremely dIfficult - - -  Some encounter information is confidential and restricted. Go to Review Flowsheets activity to see all data.  Some recent data might be hidden    Review of Systems  Constitutional: Positive for appetite change and diaphoresis.  HENT: Negative.   Eyes: Negative.   Respiratory: Negative.   Cardiovascular: Positive for leg  swelling.  Gastrointestinal: Positive for abdominal pain and vomiting.  Endocrine: Negative.   Genitourinary: Negative.   Musculoskeletal: Positive for arthralgias, back pain, gait problem, myalgias and neck pain.  Skin: Negative.   Allergic/Immunologic: Negative.   Neurological: Positive for dizziness, weakness and numbness.  Psychiatric/Behavioral: Positive for dysphoric mood. The patient is nervous/anxious.        Objective:   Physical Exam Vitals signs and nursing note reviewed.  Constitutional:      Appearance: Normal appearance.  HENT:     Head: Normocephalic and atraumatic.  Neck:     Musculoskeletal: Normal range of motion and neck supple.  Cardiovascular:     Rate and Rhythm: Normal rate and regular rhythm.     Pulses: Normal pulses.     Heart sounds: Normal heart sounds.  Pulmonary:     Effort: Pulmonary effort is normal.     Breath sounds: Normal breath sounds.  Musculoskeletal:     Comments: Normal Muscle Bulk and Muscle Testing Reveals:  Upper Extremities: Morales ROM and Muscle Strength 5/5 Thoracic Paraspinal Tenderness: T-7-T-9 Lumbar Paraspinal Tenderness: L-4-L-5 Bilateral Greater Trochanter Tenderness Lower Extremities: Decreased ROM and Muscle Strength 5/5 Right Lower Extremity Flexion Produces Pain into Right Patella Arises from chair slowly using cane for support Antalgic Gait   Skin:    General: Skin is warm and dry.  Neurological:     Mental Status: She is alert and oriented to person, place, and time.  Psychiatric:        Mood and Affect: Mood normal.        Behavior: Behavior normal.           Assessment & Plan:  1.Chronic Bilateral Thoracic Back Pain/ Low Back Pain/ Lumbar Facet Arthropathy:RefilledMSIR15mg  one tablet every 6 hoursas needed #120and MS Contin100 mg every 12 hours #60.Continue Gabapentin and Pamelor.09/13/2018 We will continue the opioid monitoring program, this consists of regular clinic visits, examinations, urine  drug screen, pill counts as well as use of [place] Controlled Substance Reporting System. 2. Degenerative Disk Disease:Encouraged Continue HEP as Tolertatedand heat therapy. Continue Current Medication Regime.09/13/2018. 3.Bilateral Greater Trochanteric Bursitis: Continuecurrent treatment regimenwith Heat and Ice Therapy.09/13/2018. 4. Osteoarthritis of Bilateral Knees: Continue current treatment modality with homeexerciseprogramand heat therapy. 09/13/2018. 5. Migraine Headaches:Continue withMigraine Journal.Continue: Aimovig,LamictalandMaxalt. Continue to Monitor.09/13/2018 6. Smoking Cessation: She has quit smokingsince 08/25/2016. Continue to Monitor.09/13/2018 7.Constipation: ContinueCurrent medication regime withLinzess.09/13/2018 8. Muscle Spasm: Continuecurrent medication regime withTizanidine.08/20/2018 9. DepressionAnxiety/ Panic Attacks: Continuecurrent medication regimen and treatment modality.Celexa.Lamictaland Counseling with [name] andDr.Akhtart Psychiatrist.08/20/2018 10. RSD: Continue with current medication Regimewith Gabapentin.09/13/2018  15 minutes of face to face patient care time was spent during this visit. All questions were encouraged and answered.  F/U in 1 month

## 2018-09-17 LAB — DRUG TOX MONITOR 1 W/CONF, ORAL FLD
Amphetamines: NEGATIVE ng/mL (ref <10)
Barbiturates: NEGATIVE ng/mL (ref <10)
Benzodiazepines: NEGATIVE ng/mL (ref <0.50)
Buprenorphine: NEGATIVE ng/mL (ref <0.10)
Cocaine: NEGATIVE ng/mL (ref <5.0)
Codeine: NEGATIVE ng/mL (ref <2.5)
Dihydrocodeine: NEGATIVE ng/mL (ref <2.5)
Fentanyl: NEGATIVE ng/mL (ref <0.10)
Heroin Metabolite: NEGATIVE ng/mL (ref <1.0)
Hydrocodone: NEGATIVE ng/mL (ref <2.5)
Hydromorphone: NEGATIVE ng/mL (ref <2.5)
MARIJUANA: NEGATIVE ng/mL (ref <2.5)
MDMA: NEGATIVE ng/mL (ref <10)
Meprobamate: NEGATIVE ng/mL (ref <2.5)
Methadone: NEGATIVE ng/mL (ref <5.0)
Morphine: 44.5 ng/mL — ABNORMAL HIGH (ref <2.5)
Nicotine Metabolite: NEGATIVE ng/mL (ref <5.0)
Norhydrocodone: NEGATIVE ng/mL (ref <2.5)
Noroxycodone: 25.6 ng/mL — ABNORMAL HIGH (ref <2.5)
Opiates: POSITIVE ng/mL — AB (ref <2.5)
Oxycodone: 25.3 ng/mL — ABNORMAL HIGH (ref <2.5)
Oxymorphone: NEGATIVE ng/mL (ref <2.5)
Phencyclidine: NEGATIVE ng/mL (ref <10)
Tapentadol: NEGATIVE ng/mL (ref <5.0)
Tramadol: NEGATIVE ng/mL (ref <5.0)
Zolpidem: NEGATIVE ng/mL (ref <5.0)

## 2018-09-17 LAB — DRUG TOX ALC METAB W/CON, ORAL FLD: Alcohol Metabolite: NEGATIVE ng/mL (ref <25)

## 2018-09-24 ENCOUNTER — Other Ambulatory Visit: Payer: Self-pay

## 2018-09-24 ENCOUNTER — Ambulatory Visit (INDEPENDENT_AMBULATORY_CARE_PROVIDER_SITE_OTHER): Payer: Medicare Other | Admitting: Licensed Clinical Social Worker

## 2018-09-24 DIAGNOSIS — M51369 Other intervertebral disc degeneration, lumbar region without mention of lumbar back pain or lower extremity pain: Secondary | ICD-10-CM

## 2018-09-24 DIAGNOSIS — F411 Generalized anxiety disorder: Secondary | ICD-10-CM

## 2018-09-24 DIAGNOSIS — F331 Major depressive disorder, recurrent, moderate: Secondary | ICD-10-CM | POA: Diagnosis not present

## 2018-09-24 DIAGNOSIS — M5136 Other intervertebral disc degeneration, lumbar region: Secondary | ICD-10-CM

## 2018-09-24 DIAGNOSIS — G894 Chronic pain syndrome: Secondary | ICD-10-CM

## 2018-09-24 NOTE — Progress Notes (Signed)
Virtual Visit via Telephone Note  I connected with [name] on 09/24/18 at  8:00 AM EDT by telephone and verified that I am speaking with the correct person using two identifiers.   I discussed the limitations, risks, security and privacy concerns of performing an evaluation and management service by telephone and the availability of in person appointments. I also discussed with the patient that there may be a patient responsible charge related to this service. The patient expressed understanding and agreed to proceed.  Follow Up Instructions:    I discussed the assessment and treatment plan with the patient. The patient was provided an opportunity to ask questions and all were answered. The patient agreed with the plan and demonstrated an understanding of the instructions.   The patient was advised to call back or seek an in-person evaluation if the symptoms worsen or if the condition fails to improve as anticipated.  I provided 52 minutes of non-face-to-face time during this encounter.   THERAPIST PROGRESS NOTE  Session Time: 8:01 AM to 8:53 AM  Participation Level: Active  Behavioral Response: CasualAlertDysphoric and calm  Type of Therapy: Individual Therapy  Treatment Goals addressed:  Decrease in depression, anxiety and panic  Interventions: Solution Focused, Strength-based, Supportive, Reframing and Other: coping  Summary: KAMIRAH SHUGRUE is a 58 y.o. female who presents with depression and trying to deal with weaning off of pills. Did not sleep last night related to pain and depression.  Shares they are weaning off of pills but very slowly, cut down to 5 less pills, finding ways to compensate (ice pack, heat pad, stretching out the pills) every little bit helps. Reviewed having depression for years and the pills don't do that much for you, maybe cry less. Medication management is being done by doctor who manages her pain. He has tried her on a lot of different  medications and came up with Celexa and helped more than anything else. On Neurontin for pain and also anxiety but doesn't seem to do that much.  Reviewed possibility that stronger medications such as morphine and MS Contin cause her less able to feel effects. She sees Dr. Charise Carwin for medications for thyroid and what she thinks is a prediabetic med to keep cholesterol down.   Reviewed sources of depression and patient shares a lot of it is stuff from from the past, also [name] who doesn't have me as a mother I guess.  I haven't talked to her in over a year, haven't contacted her, broke down and emailed her last week. Still I hope to hear from her soon. Can picture her deleting it. Explored possible reasons she is not contacting patient. All she has been able to find out is that [name] is my fourth husband. Something to do why had some many husbands why had to have another husband. Explored past and she relates that she went through a lot of things with her father and won't tell. When she was younger tried to be fair with three kids (61 and twins). Dad showed lack of interest in relationship with her. Dad made her feel dirty and something wrong with her (had to take a bath after having taken one at mom's, saying her clothes smelled dirty) He and stepmom did for other child things they didn't do for her. Now she has not relationship with any of them, hates men. Started to refuse to go to their house, I think scared of something bad happening, some punishment always something to make  her feel bad. Reviewed that second husband was a jerk and was with him 4 years.  Third husband was good to her and after he left she said she left him.  He is the father of twins.  Recent relationship broke up is because of me and she could be angry at me.    Reviewed session and patient said not sure what she got from it, shared maybe she would ever get over this depression, therapist reframed to help her see events are currently  happening but does not mean they will always be this way.  Encouraged her in her relationship with her adopted daughter and granddaughter as she gets a lot of positive action with them  Suicidal/Homicidal: No  Therapist Response: Therapist assessed patient current functioning per report and processed feelings related to feelings of depression.  Reviewed source of estrangement with daughter, reviewing significant issues she needs to work through that may relate to distancing herself at the same time pointing out lack of relationship skills on her part and not communicating to patient why she has distance herself so that like in a healthy relationship they can work through issues.  Encourage patient to be patient, that time does not stand still so this moment is not going to be what is permanent but things changing will bring possibilities of renewal of communication.  Encourage patient to channel energies to sources where she finds reciprocation of caring, that is in her relationship with Andria Frames and [name], which helps with creation of positive emotions, validation of patient through their actions to show importance of this relationship.  Processed patient's feelings related to pain issues and stressors in general.  Validated patient on how she was feeling and provided strength based and supportive intervention. Link to treatment goals-depression Plan: Return again in 2 weeks.2.  Therapist work with patient on processing feelings and exploring coping strategies to help decrease depression, strategies to help with stress management in general more particularly related to pain issues  Diagnosis: Axis I:  major depressive disorder, recurrent, moderate, degenerative disc disease, chronic pain syndrome, generalized anxiety disorder    Axis II: No diagnosis    [name], LCSW 09/24/2018

## 2018-09-26 ENCOUNTER — Telehealth: Payer: Self-pay | Admitting: *Deleted

## 2018-09-26 NOTE — Telephone Encounter (Signed)
Please issue a formal written warning that further discrepancies in testing and non-compliance with CSA will mean cessation rx'es for controlled substances thru this office. We would either wean off meds, or she would need to find another provider in that circumstance.

## 2018-09-26 NOTE — Telephone Encounter (Signed)
Oral swab drug screen for this encounter is inconsistent.  There is unprescribed oxycodone and its metabolites present as well as her morphine. I have called and asked for an explanation.  She reports that she had to go to a friend's "viewing" and she forgot to take her medication with her and she was hurting so bad she took two of her husband's oxycodone 10 mg tablets. I have explained that this is a violation of her controlled substance agreement. She understands but she "was so nauseous and hurting so bad". She had taken her 100 mg Morphine ER and I explained she should not have had such an extreme reaction for missing the IR dose, but she insists she was having the symptoms and had to do something. Please advise.

## 2018-10-01 NOTE — Telephone Encounter (Signed)
Formal warning letter sent through MyChart and the [place] mail.

## 2018-10-15 ENCOUNTER — Other Ambulatory Visit: Payer: Self-pay

## 2018-10-15 ENCOUNTER — Encounter: Payer: Medicare Other | Attending: Physical Medicine and Rehabilitation | Admitting: Registered Nurse

## 2018-10-15 ENCOUNTER — Other Ambulatory Visit: Payer: Self-pay | Admitting: Physical Medicine & Rehabilitation

## 2018-10-15 ENCOUNTER — Encounter: Payer: Self-pay | Admitting: Registered Nurse

## 2018-10-15 VITALS — BP 112/75 | HR 83 | Ht 68.5 in | Wt 264.6 lb

## 2018-10-15 DIAGNOSIS — M5412 Radiculopathy, cervical region: Secondary | ICD-10-CM

## 2018-10-15 DIAGNOSIS — M7061 Trochanteric bursitis, right hip: Secondary | ICD-10-CM

## 2018-10-15 DIAGNOSIS — M5136 Other intervertebral disc degeneration, lumbar region: Secondary | ICD-10-CM

## 2018-10-15 DIAGNOSIS — Z79899 Other long term (current) drug therapy: Secondary | ICD-10-CM

## 2018-10-15 DIAGNOSIS — Z5181 Encounter for therapeutic drug level monitoring: Secondary | ICD-10-CM

## 2018-10-15 DIAGNOSIS — M542 Cervicalgia: Secondary | ICD-10-CM | POA: Diagnosis not present

## 2018-10-15 DIAGNOSIS — M546 Pain in thoracic spine: Secondary | ICD-10-CM | POA: Diagnosis not present

## 2018-10-15 DIAGNOSIS — M7062 Trochanteric bursitis, left hip: Secondary | ICD-10-CM

## 2018-10-15 DIAGNOSIS — M7918 Myalgia, other site: Secondary | ICD-10-CM

## 2018-10-15 DIAGNOSIS — G8929 Other chronic pain: Secondary | ICD-10-CM

## 2018-10-15 DIAGNOSIS — Z760 Encounter for issue of repeat prescription: Secondary | ICD-10-CM | POA: Insufficient documentation

## 2018-10-15 DIAGNOSIS — M47816 Spondylosis without myelopathy or radiculopathy, lumbar region: Secondary | ICD-10-CM

## 2018-10-15 DIAGNOSIS — M1712 Unilateral primary osteoarthritis, left knee: Secondary | ICD-10-CM

## 2018-10-15 DIAGNOSIS — M1711 Unilateral primary osteoarthritis, right knee: Secondary | ICD-10-CM

## 2018-10-15 DIAGNOSIS — G894 Chronic pain syndrome: Secondary | ICD-10-CM

## 2018-10-15 MED ORDER — MORPHINE SULFATE 15 MG PO TABS: 15.0000 mg | ORAL_TABLET | Freq: Four times a day (QID) | ORAL | 0 refills | Status: DC | PRN

## 2018-10-15 MED ORDER — MORPHINE SULFATE ER 100 MG PO TBCR: 100.0000 mg | EXTENDED_RELEASE_TABLET | Freq: Two times a day (BID) | ORAL | 0 refills | Status: DC

## 2018-10-15 NOTE — Patient Instructions (Signed)
The Goal for you're MSIR to decrease to every 8 hours.

## 2018-10-15 NOTE — Progress Notes (Signed)
Subjective:    Patient ID: [name], female    DOB: 1960/08/24, 58 y.o.   MRN: [id number]  HPI: [name] is a 58 y.o. female who returns for follow up appointment for chronic pain and medication refill. She states her pain is located in her neck radiating into her left shoulder, mid- lower back pain radiating into her bilateral hips and lower extremities. Also reports bilateral knee pain. She rates her  Pain 8. Her current exercise regime is walking and performing stretching exercises with Bands.   Discussed with [name] in detail her UDS results, we will continue with the slow weaning of MSIR goal is to decrease to every 8 hours, she verbalizes understanding. Also discussed decreasing MS Contin to 90 mg Q 12 hours with  a goal to decrease to 60 mg every 12 hours, she verbalizes understanding. She is very tearful, emotional support given.   [name] Morphine equivalent is 261.61  MME.  Last Oral Swab was Performed on 09/13/2018, it was inconsistent, see note for details.   Pain Inventory Average Pain 7-8 Pain Right Now 8 My pain is constant, sharp, burning, dull, stabbing, tingling and aching  In the last 24 hours, has pain interfered with the following? General activity 10 Relation with others 10 Enjoyment of life 10 What TIME of day is your pain at its worst? n/a Sleep (in general) Poor  Pain is worse with: walking, bending, standing and some activites Pain improves with: rest, heat/ice, therapy/exercise, medication, TENS and injections Relief from Meds: 5-6  Mobility use a cane use a walker how many minutes can you walk? 5-7 ability to climb steps?  yes do you drive?  yes Do you have any goals in this area?  yes  Function not employed: date last employed 02/24/2003 disabled: date disabled 2007 I need assistance with the following:  bathing, meal prep, household duties and shopping Do you have any goals in this area?  yes  Neuro/Psych  weakness numbness tremor tingling trouble walking spasms dizziness confusion depression anxiety  Prior Studies Any changes since last visit?  no  Physicians involved in your care Any changes since last visit?  no   Family History  Problem Relation Age of Onset  . Depression Mother   . Hypertension Mother   . Hypertension Father   . Heart failure Father   . Diabetes Father   . Heart attack Father   . Colon cancer Neg Hx   . Esophageal cancer Neg Hx   . Stomach cancer Neg Hx   . Rectal cancer Neg Hx    Social History   Socioeconomic History  . Marital status: Married    Spouse name: [name]  . Number of children: 1  . Years of education: some college  . Highest education level: Not on file  Occupational History  . Occupation: On disability    Employer: UNEMPLOYED  Social Needs  . Financial resource strain: Not on file  . Food insecurity    Worry: Not on file    Inability: Not on file  . Transportation needs    Medical: Not on file    Non-medical: Not on file  Tobacco Use  . Smoking status: Former Smoker    Packs/day: 0.50    Types: Cigarettes    Quit date: 02/06/2016    Years since quitting: 2.6  . Smokeless tobacco: Never Used  . Tobacco comment: going to try e-sticks to quit  Substance and Sexual Activity  .  Alcohol use: No    Alcohol/week: 0.0 standard drinks  . Drug use: No  . Sexual activity: Not Currently    Partners: Male  Lifestyle  . Physical activity    Days per week: Not on file    Minutes per session: Not on file  . Stress: Not on file  Relationships  . Social Herbalist on phone: Not on file    Gets together: Not on file    Attends religious service: Not on file    Active member of club or organization: Not on file    Attends meetings of clubs or organizations: Not on file    Relationship status: Not on file  Other Topics Concern  . Not on file  Social History Narrative   8-10 cups of soda a day.  On disability. No  regular exercise.     Past Surgical History:  Procedure Laterality Date  . ABDOMINAL HYSTERECTOMY  May 2004  . Basel Cell Carcinoma  06/2005, 07/2005   nasal tip and reconstruction  . BREAST CYST ASPIRATION    . CARPAL TUNNEL RELEASE  07/1993   both hands  . COLONOSCOPY    . HEMORROIDECTOMY  July 2003  . KNEE ARTHROPLASTY  09/2001, 05/2003   left knee, chondromalasia  . KNEE ARTHROSCOPY  jan 2005   Right knee, tisse release, chrondromalacia  . REPLACEMENT TOTAL KNEE  Nov 2005   Left   . THYROID LOBECTOMY  01/2005   malignant area removed from right lobe  . THYROID SURGERY     thyroid lobe removal  . TONSILLECTOMY  09/1972  . TUBAL LIGATION  Sept 1999  . Ulnar nerve entrapment  Feb 1995   left elbow   Past Medical History:  Diagnosis Date  . Allergy   . Anxiety   . Cancer ([place])    cancerous nodules on thyroid  . Cancer ([place])    basal cell Cancer-nose  . Chronic pain syndrome   . COPD (chronic obstructive pulmonary disease) ([place])   . Degeneration of lumbar or lumbosacral intervertebral disc   . Depression   . Facet syndrome, lumbar   . GERD (gastroesophageal reflux disease)   . Hyperlipidemia   . Intervertebral lumbar disc disorder with myelopathy, lumbar region   . Lumbosacral spondylosis without myelopathy   . Migraine without aura, with intractable migraine, so stated, without mention of status migrainosus   . Primary localized osteoarthrosis, lower leg   . Thyroid disease    hypothyroid  . Unspecified musculoskeletal disorders and symptoms referable to neck    cervical/trapezius   BP (!) 146/101   Pulse 92   Ht 5' 8.5" (1.74 m)   Wt 264 lb 9.6 oz (120 kg)   SpO2 95%   BMI 39.65 kg/m   Opioid Risk Score:   Fall Risk Score:  `1  Depression screen PHQ 2/9  Depression screen [facility] 2/9 06/07/2018 01/28/2018 01/28/2018 10/11/2017 10/25/2016 08/25/2016 07/14/2016  Decreased Interest 3 3 3 3 3 3 3   Down, Depressed, Hopeless 3 3 3 3 3 3 3   PHQ - 2 Score 6 6 6 6 6 6 6    Altered sleeping - 3 3 3  - - -  Tired, decreased energy - 3 3 3  - - -  Change in appetite - 3 3 2  - - -  Feeling bad or failure about yourself  - 3 3 3  - - -  Trouble concentrating - 1 1 3  - - -  Moving slowly  or fidgety/restless - 0 0 0 - - -  Suicidal thoughts - 2 2 0 - - -  PHQ-9 Score - 21 21 20  - - -  Difficult doing work/chores - Extremely dIfficult Extremely dIfficult Extremely dIfficult - - -  Some encounter information is confidential and restricted. Go to Review Flowsheets activity to see all data.  Some recent data might be hidden    Review of Systems  Constitutional: Positive for appetite change.  HENT: Negative.   Eyes: Negative.   Respiratory: Negative.   Cardiovascular: Negative.   Gastrointestinal: Positive for nausea and vomiting.  Endocrine: Negative.   Genitourinary: Negative.   Musculoskeletal: Positive for gait problem and joint swelling.  Skin: Negative.   Allergic/Immunologic: Negative.   Neurological: Positive for dizziness, tremors, weakness and numbness.  Hematological: Negative.   Psychiatric/Behavioral: Positive for confusion and dysphoric mood. The patient is nervous/anxious.   All other systems reviewed and are negative.      Objective:   Physical Exam Vitals signs and nursing note reviewed.  Constitutional:      Appearance: Normal appearance. She is obese.  Neck:     Musculoskeletal: Normal range of motion and neck supple.  Cardiovascular:     Rate and Rhythm: Normal rate and regular rhythm.     Pulses: Normal pulses.     Heart sounds: Normal heart sounds.  Pulmonary:     Effort: Pulmonary effort is normal.     Breath sounds: Normal breath sounds.  Musculoskeletal:     Comments: Normal Muscle Bulk and Muscle Testing Reveals:  Upper Extremities: Full ROM and Muscle Strength 5/5 Left AC Joint Tenderness  Thoracic Paraspinal Tenderness: T-7-T-9 Lumbar  Paraspinal Tenderness: L-3-L-5 Bilateral Greater Trochanteric Tenderness Lower  Extremities: Full ROM and Muscle Strength 5/5 Bilateral Lower Extremities Flexion Produces Pain into Bilateral Patella's Arises from Table slowly Antalgic Gait   Skin:    General: Skin is warm and dry.  Neurological:     Mental Status: She is alert and oriented to person, place, and time.  Psychiatric:        Behavior: Behavior normal.           Assessment & Plan:  1.Chronic Bilateral Thoracic Back Pain/ Low Back Pain/ Lumbar Facet Arthropathy:RefilledMSIR15mg  one tablet every 6 hoursas needed #120and MS Contin100 mg every 12 hours #60.Continue Gabapentin and Pamelor.10/15/2018 We will continue the opioid monitoring program, this consists of regular clinic visits, examinations, urine drug screen, pill counts as well as use of [place] Controlled Substance Reporting System. 2. Degenerative Disk Disease:Encouraged Continue HEP as Tolertatedand heat therapy. Continue Current Medication Regime.10/15/2018. 3.Bilateral Greater Trochanteric Bursitis: Continuecurrent treatment regimenwith Heat and Ice Therapy.10/15/2018. 4. Osteoarthritis of Bilateral Knees: Continue current treatment modality with homeexerciseprogramand heat therapy. 10/15/2018. 5. Migraine Headaches:Continue withMigraine Journal.Continue: Aimovig,LamictalandMaxalt. Continue to Monitor.10/15/2018 6. Smoking Cessation: She has quit smokingsince 08/25/2016. Continue to Monitor.10/15/2018 7.Constipation: ContinueCurrent medication regime withLinzess.10/15/2018 8. Muscle Spasm: Continuecurrent medication regime withTizanidine.10/15/2018 9. DepressionAnxiety/ Panic Attacks: Continuecurrent medication regimen and treatment modality.Celexa.Lamictaland Counseling with [name] andDr.Akhtart Psychiatrist.10/15/2018 10. RSD: Continue with current medication Regimewith Gabapentin.10/15/2018  55minutes of face to face patient care time was spent during this visit. All questions  were encouraged and answered.  F/U in 1 month

## 2018-10-17 ENCOUNTER — Ambulatory Visit (INDEPENDENT_AMBULATORY_CARE_PROVIDER_SITE_OTHER): Payer: Medicare Other | Admitting: Licensed Clinical Social Worker

## 2018-10-17 DIAGNOSIS — M5136 Other intervertebral disc degeneration, lumbar region: Secondary | ICD-10-CM | POA: Diagnosis not present

## 2018-10-17 DIAGNOSIS — G894 Chronic pain syndrome: Secondary | ICD-10-CM | POA: Diagnosis not present

## 2018-10-17 DIAGNOSIS — F411 Generalized anxiety disorder: Secondary | ICD-10-CM | POA: Diagnosis not present

## 2018-10-17 DIAGNOSIS — F331 Major depressive disorder, recurrent, moderate: Secondary | ICD-10-CM

## 2018-10-17 NOTE — Progress Notes (Signed)
Virtual Visit via Telephone Note  I connected with [name] on 10/17/18 at  8:00 AM EDT by telephone and verified that I am speaking with the correct person using two identifiers.   I discussed the limitations, risks, security and privacy concerns of performing an evaluation and management service by telephone and the availability of in person appointments. I also discussed with the patient that there may be a patient responsible charge related to this service. The patient expressed understanding and agreed to proceed.   I discussed the assessment and treatment plan with the patient. The patient was provided an opportunity to ask questions and all were answered. The patient agreed with the plan and demonstrated an understanding of the instructions.   The patient was advised to call back or seek an in-person evaluation if the symptoms worsen or if the condition fails to improve as anticipated.  I provided 52 minutes of non-face-to-face time during this encounter.   THERAPIST PROGRESS NOTE  Session Time: 8:06 AM to 8:58 AM  Participation Level: Active  Behavioral Response: CasualAlertappropriate  Type of Therapy: Individual Therapy  Treatment Goals addressed:  Decrease in depression, anxiety and panic  Interventions: Solution Focused, Strength-based, Supportive and Other: coping  Summary: [name] is a 58 y.o. female who presents with mood alright I guess-medium not up or down just "floating around". Day to day medium, little more cheerful last week, thinking of [name]. Autistic nephew back in the hospital. Shares her life right now as always at home. If it wasn't for virus would do a few things. Can't take chances of getting sick and then getting him sick. He is my life line. People not talking to you, can't do anything about it. I have done everything I can except show up on door step. (relationship with daughter [name])  Describes feels Impotent. Very frustrating. The  same way I feel about my body. All the changes and my body and nothing I can do to make it stop, impotent, and get so mad. This body is not who I am. I can't remember when I looked in a mirror because I don't know her. Best I looked when I met [name]. I love to cook and I can't do that now. Shared some of the recipes that people have loved.  [name] and [name] (adopted daughter) are now out in [place]. Moved last year after I was in the hospital in December. They are not coming back, they want to go to [place] when he retires. Big thing they moved-"you are not kidding" he is the service but it still makes me feel bad. Miss them and it hurts. She is the only grandchild I am ever going to have. Not accepting that either. Hurts to be far away from the ones we love. What can I do they are gone, no control. Keep putting [name] (granddaughter) money in her savings account. If she needs something I send money to [place].    I love to cook. I wish I could do that again.  Nobody calls me. Not my sisters or brothers. No point in me calling because they have a life doing something. All know that I am here doing nothing and you can call me anytime. Really aggravates me. They are running around living their life and not thinking about me. If thinking about me they aren't at a place where they can call me.   Suicidal/Homicidal: No  Therapist Response: Therapist assess patient's current functioning and processed feelings  related to current stressors and loss of family members moving away, family member cutting off contact, issues with her own physical health. Discussed strengths of patient being so giving with her family. Discussed memory can be a positive thing helps [place] to relive positive experiences such as when we were younger, positive experiences we have had and will always be a part of our life..  Encouraged patient to pick up the phone. When look back at life most important thing is family and relationship.   Validated patient on how feeling as well as providing strength based and supportive interventions.  Plan: Return again in 3-4 weeks.2.Continue to process feelings related to loss, stressors.3.Working on coping, positive reframing  Diagnosis: Axis I:  major depressive disorder, recurrent, moderate, degenerative disc disease, chronic pain syndrome, generalized anxiety disorder    Axis II: No diagnosis    [name], LCSW 10/17/2018

## 2018-10-31 ENCOUNTER — Other Ambulatory Visit: Payer: Self-pay | Admitting: Registered Nurse

## 2018-11-14 ENCOUNTER — Encounter: Payer: Medicare Other | Attending: Physical Medicine and Rehabilitation | Admitting: Registered Nurse

## 2018-11-14 ENCOUNTER — Other Ambulatory Visit: Payer: Self-pay

## 2018-11-14 ENCOUNTER — Ambulatory Visit (HOSPITAL_COMMUNITY): Payer: Medicare Other | Admitting: Licensed Clinical Social Worker

## 2018-11-14 ENCOUNTER — Encounter: Payer: Self-pay | Admitting: Registered Nurse

## 2018-11-14 VITALS — BP 111/75 | HR 73 | Temp 97.7°F | Ht 68.0 in | Wt 263.0 lb

## 2018-11-14 DIAGNOSIS — Z760 Encounter for issue of repeat prescription: Secondary | ICD-10-CM | POA: Insufficient documentation

## 2018-11-14 DIAGNOSIS — M546 Pain in thoracic spine: Secondary | ICD-10-CM | POA: Diagnosis not present

## 2018-11-14 DIAGNOSIS — Z79899 Other long term (current) drug therapy: Secondary | ICD-10-CM | POA: Diagnosis not present

## 2018-11-14 DIAGNOSIS — M7061 Trochanteric bursitis, right hip: Secondary | ICD-10-CM | POA: Diagnosis not present

## 2018-11-14 DIAGNOSIS — M1711 Unilateral primary osteoarthritis, right knee: Secondary | ICD-10-CM

## 2018-11-14 DIAGNOSIS — M5136 Other intervertebral disc degeneration, lumbar region: Secondary | ICD-10-CM

## 2018-11-14 DIAGNOSIS — M47816 Spondylosis without myelopathy or radiculopathy, lumbar region: Secondary | ICD-10-CM

## 2018-11-14 DIAGNOSIS — Z5181 Encounter for therapeutic drug level monitoring: Secondary | ICD-10-CM | POA: Diagnosis not present

## 2018-11-14 DIAGNOSIS — M7062 Trochanteric bursitis, left hip: Secondary | ICD-10-CM

## 2018-11-14 DIAGNOSIS — G894 Chronic pain syndrome: Secondary | ICD-10-CM

## 2018-11-14 DIAGNOSIS — M1712 Unilateral primary osteoarthritis, left knee: Secondary | ICD-10-CM

## 2018-11-14 DIAGNOSIS — G8929 Other chronic pain: Secondary | ICD-10-CM

## 2018-11-14 MED ORDER — MORPHINE SULFATE ER 30 MG PO TBCR: 30.0000 mg | EXTENDED_RELEASE_TABLET | Freq: Two times a day (BID) | ORAL | 0 refills | Status: DC

## 2018-11-14 MED ORDER — MORPHINE SULFATE ER 60 MG PO TBCR: 60.0000 mg | EXTENDED_RELEASE_TABLET | Freq: Two times a day (BID) | ORAL | 0 refills | Status: DC

## 2018-11-14 MED ORDER — MORPHINE SULFATE 15 MG PO TABS: 15.0000 mg | ORAL_TABLET | Freq: Four times a day (QID) | ORAL | 0 refills | Status: DC | PRN

## 2018-11-14 NOTE — Progress Notes (Signed)
Subjective:    Patient ID: [name], female    DOB: [birth year], 58 y.o.   MRN: [id number]  HPI: [name] is a 58 y.o. female who returns for follow up appointment for chronic pain and medication refill. She states her pain is located in her mid- back mainly left side, lower back pain, bilateral hip pain and bilateral knee pain R>L. She   rates her pain 8. Her  current exercise regime is walking, and performing chair exercises QOD.   Ms. Callo Morphine equivalent is 261.11 MME.  Today we will began slow weaning of Morphine from 100 mg Q 12 hours to 90 mg every 12 hours, she verbalizes understanding.   UDS ordered for Today.   Pain Inventory Average Pain 8 Pain Right Now 8 My pain is constant, sharp, burning, dull, stabbing, tingling and aching  In the last 24 hours, has pain interfered with the following? General activity 10 Relation with others 10 Enjoyment of life 10 What TIME of day is your pain at its worst? . Sleep (in general) Poor  Pain is worse with: walking, bending, standing and some activites Pain improves with: rest, heat/ice, therapy/exercise, medication, TENS and injections Relief from Meds: 7  Mobility use a cane use a walker ability to climb steps?  yes do you drive?  yes  Function disabled: date disabled . I need assistance with the following:  bathing, meal prep, household duties and shopping  Neuro/Psych weakness numbness tremor tingling trouble walking spasms confusion depression anxiety  Prior Studies Any changes since last visit?  no  Physicians involved in your care Any changes since last visit?  no   Family History  Problem Relation Age of Onset  . Depression Mother   . Hypertension Mother   . Hypertension Father   . Heart failure Father   . Diabetes Father   . Heart attack Father   . Colon cancer Neg Hx   . Esophageal cancer Neg Hx   . Stomach cancer Neg Hx   . Rectal cancer Neg Hx    Social  History   Socioeconomic History  . Marital status: Married    Spouse name: [name]  . Number of children: 1  . Years of education: some college  . Highest education level: Not on file  Occupational History  . Occupation: On disability    Employer: UNEMPLOYED  Social Needs  . Financial resource strain: Not on file  . Food insecurity    Worry: Not on file    Inability: Not on file  . Transportation needs    Medical: Not on file    Non-medical: Not on file  Tobacco Use  . Smoking status: Former Smoker    Packs/day: 0.50    Types: Cigarettes    Quit date: 02/06/2016    Years since quitting: 2.7  . Smokeless tobacco: Never Used  . Tobacco comment: going to try e-sticks to quit  Substance and Sexual Activity  . Alcohol use: No    Alcohol/week: 0.0 standard drinks  . Drug use: No  . Sexual activity: Not Currently    Partners: Male  Lifestyle  . Physical activity    Days per week: Not on file    Minutes per session: Not on file  . Stress: Not on file  Relationships  . Social Herbalist on phone: Not on file    Gets together: Not on file    Attends religious service: Not on file  Active member of club or organization: Not on file    Attends meetings of clubs or organizations: Not on file    Relationship status: Not on file  Other Topics Concern  . Not on file  Social History Narrative   8-10 cups of soda a day.  On disability. No regular exercise.     Past Surgical History:  Procedure Laterality Date  . ABDOMINAL HYSTERECTOMY  May 2004  . Basel Cell Carcinoma  06/2005, 07/2005   nasal tip and reconstruction  . BREAST CYST ASPIRATION    . CARPAL TUNNEL RELEASE  07/1993   both hands  . COLONOSCOPY    . HEMORROIDECTOMY  July 2003  . KNEE ARTHROPLASTY  09/2001, 05/2003   left knee, chondromalasia  . KNEE ARTHROSCOPY  jan 2005   Right knee, tisse release, chrondromalacia  . REPLACEMENT TOTAL KNEE  Nov 2005   Left   . THYROID LOBECTOMY  01/2005   malignant  area removed from right lobe  . THYROID SURGERY     thyroid lobe removal  . TONSILLECTOMY  09/1972  . TUBAL LIGATION  Sept 1999  . Ulnar nerve entrapment  Feb 1995   left elbow   Past Medical History:  Diagnosis Date  . Allergy   . Anxiety   . Cancer ([place])    cancerous nodules on thyroid  . Cancer ([place])    basal cell Cancer-nose  . Chronic pain syndrome   . COPD (chronic obstructive pulmonary disease) ([place])   . Degeneration of lumbar or lumbosacral intervertebral disc   . Depression   . Facet syndrome, lumbar   . GERD (gastroesophageal reflux disease)   . Hyperlipidemia   . Intervertebral lumbar disc disorder with myelopathy, lumbar region   . Lumbosacral spondylosis without myelopathy   . Migraine without aura, with intractable migraine, so stated, without mention of status migrainosus   . Primary localized osteoarthrosis, lower leg   . Thyroid disease    hypothyroid  . Unspecified musculoskeletal disorders and symptoms referable to neck    cervical/trapezius   There were no vitals taken for this visit.  Opioid Risk Score:   Fall Risk Score:  `1  Depression screen PHQ 2/9  Depression screen [facility] 2/9 06/07/2018 01/28/2018 01/28/2018 10/11/2017 10/25/2016 08/25/2016 07/14/2016  Decreased Interest 3 3 3 3 3 3 3   Down, Depressed, Hopeless 3 3 3 3 3 3 3   PHQ - 2 Score 6 6 6 6 6 6 6   Altered sleeping - 3 3 3  - - -  Tired, decreased energy - 3 3 3  - - -  Change in appetite - 3 3 2  - - -  Feeling bad or failure about yourself  - 3 3 3  - - -  Trouble concentrating - 1 1 3  - - -  Moving slowly or fidgety/restless - 0 0 0 - - -  Suicidal thoughts - 2 2 0 - - -  PHQ-9 Score - 21 21 20  - - -  Difficult doing work/chores - Extremely dIfficult Extremely dIfficult Extremely dIfficult - - -  Some encounter information is confidential and restricted. Go to Review Flowsheets activity to see all data.  Some recent data might be hidden     Review of Systems  Constitutional: Positive for  appetite change, diaphoresis and unexpected weight change.  HENT: Negative.   Eyes: Negative.   Respiratory: Negative.   Cardiovascular: Positive for leg swelling.  Gastrointestinal: Positive for nausea and vomiting.  Endocrine: Negative.   Genitourinary:  Negative.   Musculoskeletal: Positive for arthralgias, back pain, gait problem and myalgias.  Skin: Negative.   Allergic/Immunologic: Negative.   Neurological: Positive for tremors, weakness and numbness.  Hematological: Negative.   Psychiatric/Behavioral: Positive for confusion and dysphoric mood. The patient is nervous/anxious.   All other systems reviewed and are negative.      Objective:   Physical Exam Vitals signs and nursing note reviewed.  Constitutional:      Appearance: Normal appearance.  Neck:     Musculoskeletal: Normal range of motion and neck supple.  Cardiovascular:     Rate and Rhythm: Normal rate and regular rhythm.     Pulses: Normal pulses.     Heart sounds: Normal heart sounds.  Pulmonary:     Effort: Pulmonary effort is normal.     Breath sounds: Normal breath sounds.  Musculoskeletal:     Comments: Normal Muscle Bulk and Muscle Testing Reveals:  Upper Extremities: Full ROM and Muscle Strength 5/5 Thoracic Paraspinal Tenderness: T-1-T-3 T-7-T-9 Lumbar Paraspinal Tenderness: L-3-L-5 Lower Extremities: Right: Decreased ROM and Muscle Strength 5/5  Right Lower Extremity Flexion Produces Pain into her Patella Left: Full ROM and Muscle Strength 5/5 Arises from Table Slowly using cane for support Antalgic  Gait   Skin:    General: Skin is warm and dry.  Neurological:     Mental Status: She is alert and oriented to person, place, and time.  Psychiatric:        Mood and Affect: Mood normal.        Behavior: Behavior normal.           Assessment & Plan:  1.Chronic Bilateral Thoracic Back Pain/ Low Back Pain/ Lumbar Facet Arthropathy:RefilledMSIR15mg  one tablet every 6 hoursas needed #120and  Begin Slow weaning of Morphine Decrease 100 mg to 90 mg.   MS Contin 60 mg and 30 mg Q 12 hours to equal   90 mg  #60.Continue Gabapentin and Pamelor.11/14/2018 We will continue the opioid monitoring program, this consists of regular clinic visits, examinations, urine drug screen, pill counts as well as use of [place] Controlled Substance Reporting System. 2. Degenerative Disk Disease:Encouraged Continue HEP as Tolertatedand heat therapy. Continue Current Medication Regime.11/14/2018. 3.Bilateral Greater Trochanteric Bursitis: Continuecurrent treatment regimenwith Heat and Ice Therapy.11/14/2018. 4. Osteoarthritis of Bilateral Knees: Continue current treatment modality with homeexerciseprogramand heat therapy. 11/14/2018. 5. Migraine Headaches:Continue withMigraine Journal.Continue: Aimovig,LamictalandMaxalt. Continue to Monitor.11/14/2018 6. Smoking Cessation: She has quit smokingsince 08/25/2016. Continue to Monitor.11/14/2018 7.Constipation: ContinueCurrent medication regime withLinzess.11/14/2018 8. Muscle Spasm: Continuecurrent medication regime withTizanidine.11/14/2018 9. DepressionAnxiety/ Panic Attacks: Continuecurrent medication regimen and treatment modality.Celexa.Lamictaland Counseling with [name] andDr.Akhtart Psychiatrist.11/14/2018 10. RSD: Continue with current medication Regimewith Gabapentin.11/14/2018  44minutes of face to face patient care time was spent during this visit. All questions were encouraged and answered.  F/U in 1 month

## 2018-11-18 ENCOUNTER — Telehealth: Payer: Self-pay | Admitting: *Deleted

## 2018-11-18 LAB — TOXASSURE SELECT,+ANTIDEPR,UR

## 2018-11-18 NOTE — Telephone Encounter (Signed)
Urine drug screen for this encounter is consistent for prescribed medication 

## 2018-11-26 ENCOUNTER — Ambulatory Visit (INDEPENDENT_AMBULATORY_CARE_PROVIDER_SITE_OTHER): Payer: Medicare Other | Admitting: Licensed Clinical Social Worker

## 2018-11-26 DIAGNOSIS — F331 Major depressive disorder, recurrent, moderate: Secondary | ICD-10-CM

## 2018-11-26 DIAGNOSIS — G894 Chronic pain syndrome: Secondary | ICD-10-CM

## 2018-11-26 DIAGNOSIS — F411 Generalized anxiety disorder: Secondary | ICD-10-CM

## 2018-11-26 DIAGNOSIS — M5136 Other intervertebral disc degeneration, lumbar region: Secondary | ICD-10-CM | POA: Diagnosis not present

## 2018-11-26 NOTE — Progress Notes (Signed)
Virtual Visit via Telephone Note  I connected with [name] on 11/26/18 at  8:00 AM EDT by telephone and verified that I am speaking with the correct person using two identifiers.   I discussed the limitations, risks, security and privacy concerns of performing an evaluation and management service by telephone and the availability of in person appointments. I also discussed with the patient that there may be a patient responsible charge related to this service. The patient expressed understanding and agreed to proceed.  The patient was provided an opportunity to ask questions and all were answered. The patient agreed with the plan and demonstrated an understanding of the instructions.   The patient was advised to call back or seek an in-person evaluation if the symptoms worsen or if the condition fails to improve as anticipated.  I provided 52 minutes of non-face-to-face time during this encounter.  THERAPIST PROGRESS NOTE  Session Time: 8:05 AM to 8:57 AM  Participation Level: Active  Behavioral Response: CasualAlertsubdued, reports depression and anxiety  Type of Therapy: Individual Therapy  Treatment Goals addressed:  Decrease in depression, anxiety and panic  Interventions: Solution Focused, Strength-based, Supportive and Other: coping with pain, relationships and stress  Summary: [name] is a 58 y.o. female who presents with 100 mg extended release morphine have been taking several years. Reduced it last week at the doctors.  They explained to her that they have to do something to show they are reducing opiates.  They are talking about getting down 30 mg. They might as well "lock me down" if they reduce that much. Insurance won't pay for any other types of medications. Not dropping milligrams of the instant relief but not taking as many a day. Trying to stretch it out. The provider's counsel is that she has got to buy into to make it happen. Patient describes  that she puts ice packs on her knees and hips. Trying to put ice pack on hip and not working so well, wets the bed. They have to show who monitoring body that they making an effort to decrease. Describes bad headaches describes that she is like a zombie. Relates only hurts when gets up, when gets up gets the "whirlies", nausea. Takes Malxalt that will stop headaches for a few days but then it comes back. Now headaches are worse than younger gets them 5-7 days.  Shares she has tried things in the past, monitor food for triggers, biofeedback and did not help. "My dream is to get a lap pool". Thinks it will really help. Reviewed that possibility and patient relates barrier is that they are in debt. Used to see neurologist injections that would help.  Reviewed getting the care she needs and shares going to the same doctor for 20 years which she prefers, current doctor is taking care of her for 12 to 13 years and has brought the pain down  where tolerant. No time where no pain.  over years got worse and worse.  They have explained to her that body makes something that morphine takes over for it. The body produces nature pain killers. Think once stop medications the body should take over. Scared, unbearable if have to live like that. Already have anxiety and panic and this is making it worse. Can't eat and sleep due to pain. [name] (dog) helps a lot. I can go to sleep but I can't stay asleep. Sleep an  hour and half is a good stretch of time. Pain when walking.  Wants to get a Hoveraround that would be a motorized chair around the house, have to go through steps. Shares stressor that she doesn't speak to kids. One child because she is [place] way [place] the [place]. The other because she has decided to stop talking to her. I don't know if I am ever going to speak to her or hear from her again. Reach out all the time. Shares that she feels lonely and that therapy helpful in providing supportive intervention.  Found out about  daughter and why upset that she thinks I put other people in front of her. Patient relates never made a decision without asking her. Thinks she is not looking at things right. Believes she may be jealous of Saleena, however, I talked to her about Jannette Spanner and she was all for it. they never got together to be acquainted. Deedee was far away. Jannette Spanner was closer. Reviewed daughter's approach of not communicating with patient can be hurtful for her and patient added me too. Patient added that it is not necessary. Therapist shared that healing would take place working through differences. Reviewed session and patient relates that she feels better after talk.    Therapist assess patient current functioning per report and processed feelings related to current stressors.   Therapist explored alternative strategies that would help with pain given pain medications are being decreased.  Reviewed strategies such as hydrotherapy, massage, acupuncture, relaxation and meditation. Mind-body exercises,reviewed CBT strategies are available and will explore further in later sessions. Process feelings related to not talking to daughters. Validated patient on how feeling. Provided strength based intervention in talking about patient adopted a child needing a caretaker. Reinforced patient on insight that daughter who has stopped talking to her that it is unnecessary and more healthy for healing to work through issues. Provided supportive intervention. Suicidal/Homicidal: No  Plan: Return again in 3 weeks.2.Address pain management through CBT, coping, supportive interventions  Diagnosis: Axis I:  major depressive disorder, recurrent, moderate, degenerative disc disease, chronic pain syndrome, generalized anxiety disorder    Axis II: No diagnosis    [name], LCSW 11/26/2018

## 2018-11-28 ENCOUNTER — Other Ambulatory Visit: Payer: Self-pay | Admitting: Physical Medicine & Rehabilitation

## 2018-11-30 ENCOUNTER — Other Ambulatory Visit: Payer: Self-pay | Admitting: Registered Nurse

## 2018-11-30 ENCOUNTER — Other Ambulatory Visit: Payer: Self-pay | Admitting: Family Medicine

## 2018-12-13 ENCOUNTER — Other Ambulatory Visit: Payer: Self-pay

## 2018-12-13 ENCOUNTER — Encounter: Payer: Self-pay | Admitting: Registered Nurse

## 2018-12-13 ENCOUNTER — Encounter: Payer: Medicare Other | Attending: Physical Medicine and Rehabilitation | Admitting: Registered Nurse

## 2018-12-13 VITALS — BP 116/81 | HR 90 | Temp 97.5°F | Ht 68.5 in | Wt 271.0 lb

## 2018-12-13 DIAGNOSIS — Z760 Encounter for issue of repeat prescription: Secondary | ICD-10-CM | POA: Insufficient documentation

## 2018-12-13 DIAGNOSIS — M546 Pain in thoracic spine: Secondary | ICD-10-CM | POA: Diagnosis not present

## 2018-12-13 DIAGNOSIS — Z5181 Encounter for therapeutic drug level monitoring: Secondary | ICD-10-CM | POA: Insufficient documentation

## 2018-12-13 DIAGNOSIS — G8929 Other chronic pain: Secondary | ICD-10-CM

## 2018-12-13 DIAGNOSIS — M47816 Spondylosis without myelopathy or radiculopathy, lumbar region: Secondary | ICD-10-CM | POA: Diagnosis not present

## 2018-12-13 DIAGNOSIS — M1711 Unilateral primary osteoarthritis, right knee: Secondary | ICD-10-CM | POA: Diagnosis not present

## 2018-12-13 DIAGNOSIS — Z79899 Other long term (current) drug therapy: Secondary | ICD-10-CM | POA: Diagnosis not present

## 2018-12-13 DIAGNOSIS — M7062 Trochanteric bursitis, left hip: Secondary | ICD-10-CM

## 2018-12-13 DIAGNOSIS — M7061 Trochanteric bursitis, right hip: Secondary | ICD-10-CM | POA: Diagnosis not present

## 2018-12-13 DIAGNOSIS — M5136 Other intervertebral disc degeneration, lumbar region: Secondary | ICD-10-CM

## 2018-12-13 DIAGNOSIS — M1712 Unilateral primary osteoarthritis, left knee: Secondary | ICD-10-CM

## 2018-12-13 DIAGNOSIS — G894 Chronic pain syndrome: Secondary | ICD-10-CM | POA: Insufficient documentation

## 2018-12-13 MED ORDER — MORPHINE SULFATE 15 MG PO TABS: 15.0000 mg | ORAL_TABLET | Freq: Four times a day (QID) | ORAL | 0 refills | Status: DC | PRN

## 2018-12-13 MED ORDER — MORPHINE SULFATE ER 60 MG PO TBCR: 60.0000 mg | EXTENDED_RELEASE_TABLET | Freq: Two times a day (BID) | ORAL | 0 refills | Status: DC

## 2018-12-13 MED ORDER — MORPHINE SULFATE ER 30 MG PO TBCR: 30.0000 mg | EXTENDED_RELEASE_TABLET | Freq: Two times a day (BID) | ORAL | 0 refills | Status: DC

## 2018-12-13 NOTE — Progress Notes (Signed)
Subjective:    Patient ID: [name], female    DOB: [birth year], 58 y.o.   MRN: [id number]  HPI: [name] is a 58 y.o. female who returns for follow up appointment for chronic pain and medication refill. She states her pain is located in her mid- lower back, bilateral hips and right knee. She  Rates her pain 8. Her  current exercise regime is walking and performing chair  Exercises daily she reports.   [name] Morphine equivalent is 240.58 MME.  Last UDS was Performed on 11/14/2018, it was consistent.    Pain Inventory Average Pain 8 Pain Right Now 8 My pain is constant, sharp, burning, dull, stabbing, tingling and aching  In the last 24 hours, has pain interfered with the following? General activity 10 Relation with others 10 Enjoyment of life 10 What TIME of day is your pain at its worst? daytime Sleep (in general) Poor  Pain is worse with: walking, bending, sitting, standing and some activites Pain improves with: rest, heat/ice, therapy/exercise, medication, TENS and injections Relief from Meds: 6  Mobility walk with assistance use a cane ability to climb steps?  yes do you drive?  yes  Function not employed: date last employed 2005  Neuro/Psych weakness numbness tremor tingling trouble walking spasms confusion depression anxiety  Prior Studies Any changes since last visit?  no  Physicians involved in your care Any changes since last visit?  no   Family History  Problem Relation Age of Onset  . Depression Mother   . Hypertension Mother   . Hypertension Father   . Heart failure Father   . Diabetes Father   . Heart attack Father   . Colon cancer Neg Hx   . Esophageal cancer Neg Hx   . Stomach cancer Neg Hx   . Rectal cancer Neg Hx    Social History   Socioeconomic History  . Marital status: Married    Spouse name: [name]  . Number of children: 1  . Years of education: some college  . Highest education level: Not  on file  Occupational History  . Occupation: On disability    Employer: UNEMPLOYED  Social Needs  . Financial resource strain: Not on file  . Food insecurity    Worry: Not on file    Inability: Not on file  . Transportation needs    Medical: Not on file    Non-medical: Not on file  Tobacco Use  . Smoking status: Former Smoker    Packs/day: 0.50    Types: Cigarettes    Quit date: 02/06/2016    Years since quitting: 2.8  . Smokeless tobacco: Never Used  . Tobacco comment: going to try e-sticks to quit  Substance and Sexual Activity  . Alcohol use: No    Alcohol/week: 0.0 standard drinks  . Drug use: No  . Sexual activity: Not Currently    Partners: Male  Lifestyle  . Physical activity    Days per week: Not on file    Minutes per session: Not on file  . Stress: Not on file  Relationships  . Social Herbalist on phone: Not on file    Gets together: Not on file    Attends religious service: Not on file    Active member of club or organization: Not on file    Attends meetings of clubs or organizations: Not on file    Relationship status: Not on file  Other Topics Concern  .  Not on file  Social History Narrative   8-10 cups of soda a day.  On disability. No regular exercise.     Past Surgical History:  Procedure Laterality Date  . ABDOMINAL HYSTERECTOMY  May 2004  . Basel Cell Carcinoma  06/2005, 07/2005   nasal tip and reconstruction  . BREAST CYST ASPIRATION    . CARPAL TUNNEL RELEASE  07/1993   both hands  . COLONOSCOPY    . HEMORROIDECTOMY  July 2003  . KNEE ARTHROPLASTY  09/2001, 05/2003   left knee, chondromalasia  . KNEE ARTHROSCOPY  jan 2005   Right knee, tisse release, chrondromalacia  . REPLACEMENT TOTAL KNEE  Nov 2005   Left   . THYROID LOBECTOMY  01/2005   malignant area removed from right lobe  . THYROID SURGERY     thyroid lobe removal  . TONSILLECTOMY  09/1972  . TUBAL LIGATION  Sept 1999  . Ulnar nerve entrapment  Feb 1995   left elbow    Past Medical History:  Diagnosis Date  . Allergy   . Anxiety   . Cancer ([place])    cancerous nodules on thyroid  . Cancer ([place])    basal cell Cancer-nose  . Chronic pain syndrome   . COPD (chronic obstructive pulmonary disease) ([place])   . Degeneration of lumbar or lumbosacral intervertebral disc   . Depression   . Facet syndrome, lumbar   . GERD (gastroesophageal reflux disease)   . Hyperlipidemia   . Intervertebral lumbar disc disorder with myelopathy, lumbar region   . Lumbosacral spondylosis without myelopathy   . Migraine without aura, with intractable migraine, so stated, without mention of status migrainosus   . Primary localized osteoarthrosis, lower leg   . Thyroid disease    hypothyroid  . Unspecified musculoskeletal disorders and symptoms referable to neck    cervical/trapezius   BP 116/81   Pulse 90   Temp (!) 97.5 F (36.4 C)   Ht 5' 8.5" (1.74 m)   Wt 271 lb (122.9 kg)   SpO2 95%   BMI 40.61 kg/m   Opioid Risk Score:   Fall Risk Score:  `1  Depression screen PHQ 2/9  Depression screen [facility] 2/9 06/07/2018 01/28/2018 01/28/2018 10/11/2017 10/25/2016 08/25/2016 07/14/2016  Decreased Interest 3 3 3 3 3 3 3   Down, Depressed, Hopeless 3 3 3 3 3 3 3   PHQ - 2 Score 6 6 6 6 6 6 6   Altered sleeping - 3 3 3  - - -  Tired, decreased energy - 3 3 3  - - -  Change in appetite - 3 3 2  - - -  Feeling bad or failure about yourself  - 3 3 3  - - -  Trouble concentrating - 1 1 3  - - -  Moving slowly or fidgety/restless - 0 0 0 - - -  Suicidal thoughts - 2 2 0 - - -  PHQ-9 Score - 21 21 20  - - -  Difficult doing work/chores - Extremely dIfficult Extremely dIfficult Extremely dIfficult - - -  Some encounter information is confidential and restricted. Go to Review Flowsheets activity to see all data.  Some recent data might be hidden     Review of Systems  Constitutional: Negative.   HENT: Negative.   Eyes: Negative.   Respiratory: Negative.   Cardiovascular: Negative.    Gastrointestinal: Negative.   Endocrine: Negative.   Genitourinary: Negative.   Musculoskeletal: Positive for arthralgias, back pain, gait problem and myalgias.  Skin: Negative.  Allergic/Immunologic: Negative.   Neurological: Positive for weakness.  Hematological: Negative.   Psychiatric/Behavioral: Negative.   All other systems reviewed and are negative.      Objective:   Physical Exam Vitals signs and nursing note reviewed.  Constitutional:      Appearance: Normal appearance. She is obese.  Neck:     Musculoskeletal: Normal range of motion and neck supple.  Cardiovascular:     Rate and Rhythm: Normal rate and regular rhythm.     Pulses: Normal pulses.     Heart sounds: Normal heart sounds.  Pulmonary:     Effort: Pulmonary effort is normal.     Breath sounds: Normal breath sounds.  Musculoskeletal:     Right lower leg: Edema present.     Left lower leg: Edema present.     Comments: Normal Muscle Bulk and Muscle Testing Reveals:  Upper Extremities: Full ROM and Muscle Strength 5/5 Thoracic Hypersensitivity Lumbar Paraspinal Tenderness: L-3-L-5 Lower Extremities: Right: Decreased ROM and Muscle Strength 5/5 Right Lower Extremity Flexion Produces Pain into Right Patella Left: Full ROM and Muscle Strength 5/5 Arises from Table Slowly using cane for support Antalgic  Gait   Skin:    General: Skin is warm and dry.  Neurological:     Mental Status: She is alert and oriented to person, place, and time.  Psychiatric:        Mood and Affect: Mood normal.        Behavior: Behavior normal.           Assessment & Plan:  1.Chronic Bilateral Thoracic Back Pain/ Low Back Pain/ Lumbar Facet Arthropathy:RefilledMSIR15mg  one tablet every 6 hoursas needed #120and Begin Slow weaning of Morphine Decrease 100 mg to 90 mg.   MS Contin 60 mg and 30 mg Q 12 hours to equal   90 mg  #60.Continue Gabapentin and Pamelor.12/13/2018 We will continue the opioid monitoring program,  this consists of regular clinic visits, examinations, urine drug screen, pill counts as well as use of [place] Controlled Substance Reporting System. 2. Degenerative Disk Disease:Encouraged Continue HEP as Tolertatedand heat therapy. Continue Current Medication Regime.12/13/2018. 3.Bilateral Greater Trochanteric Bursitis: Continuecurrent treatment regimenwith Heat and Ice Therapy.12/13/2018. 4. Osteoarthritis of Bilateral Knees: Continue current treatment modality with homeexerciseprogramand heat therapy. 12/13/2018. 5. Migraine Headaches:Continue withMigraine Journal.Continue: LamictalandMaxalt. Continue to Monitor.12/13/2018 6. Smoking Cessation: She has quit smokingsince 08/25/2016. Continue to Monitor.12/13/2018 7.Constipation: ContinueCurrent medication regime withLinzess.12/13/2018 8. Muscle Spasm: Continuecurrent medication regime withTizanidine.12/15/2018 9. DepressionAnxiety/ Panic Attacks: Continuecurrent medication regimen and treatment modality.Celexa.Lamictaland Counseling with [name] andDr.Akhtart Psychiatrist.12/13/2018 10. RSD: Continue with current medication Regimewith Gabapentin.12/13/2018  73minutes of face to face patient care time was spent during this visit. All questions were encouraged and answered.  F/U in 1 month

## 2018-12-16 ENCOUNTER — Ambulatory Visit (INDEPENDENT_AMBULATORY_CARE_PROVIDER_SITE_OTHER): Payer: Medicare Other | Admitting: Licensed Clinical Social Worker

## 2018-12-16 ENCOUNTER — Other Ambulatory Visit: Payer: Self-pay

## 2018-12-16 DIAGNOSIS — M5136 Other intervertebral disc degeneration, lumbar region: Secondary | ICD-10-CM | POA: Diagnosis not present

## 2018-12-16 DIAGNOSIS — F411 Generalized anxiety disorder: Secondary | ICD-10-CM | POA: Diagnosis not present

## 2018-12-16 DIAGNOSIS — G894 Chronic pain syndrome: Secondary | ICD-10-CM | POA: Diagnosis not present

## 2018-12-16 DIAGNOSIS — F331 Major depressive disorder, recurrent, moderate: Secondary | ICD-10-CM | POA: Diagnosis not present

## 2018-12-16 NOTE — Progress Notes (Signed)
Virtual Visit via Telephone Note  I connected with [name] on 12/16/18 at  8:00 AM EST by telephone and verified that I am speaking with the correct person using two identifiers.   I discussed the limitations, risks, security and privacy concerns of performing an evaluation and management service by telephone and the availability of in person appointments. I also discussed with the patient that there may be a patient responsible charge related to this service. The patient expressed understanding and agreed to proceed.  I discussed the assessment and treatment plan with the patient. The patient was provided an opportunity to ask questions and all were answered. The patient agreed with the plan and demonstrated an understanding of the instructions.   The patient was advised to call back or seek an in-person evaluation if the symptoms worsen or if the condition fails to improve as anticipated.  I provided 52 minutes of non-face-to-face time during this encounter.   THERAPIST PROGRESS NOTE  Session Time: 8:02 AM to 8:54 AM Participation Level: Active  Behavioral Response: CasualAlertAnxious and Dysphoric  Type of Therapy: Individual Therapy  Treatment Goals addressed:   Decrease in depression, anxiety and panic Interventions: Solution Focused, Strength-based, Supportive and Other: coping  Summary: [name] is a 58 y.o. female who presents with finished [place] helps to pass the time, makes a good feeling thinking about what you would do if you had the money. Does everyday. Nice memories of maternal grandfather doing it. Does coloring mostly everyday except when have migraines and can't hardly do anything. Shot of Amovig 4-5 months taking Nothing change. Throws up. Maxalt for pain. When notice headache starting up take it. Only can take two and I am really careful of it because I take a lot of medicine. I am really careful of medicine.  They are  weaning me down on morphine. Over the years I built tolerance. I have taken big doses of that. Was on 100 mg twice a day. Trying to stretch the instant relief out. Cut back to 115 from 120 mg. I am trying and making the effort. Ice pack on knee-4-5 hours where before I had gotten to the point where I wasn't using them. Nurse practitioner says I have to want it for it to work. Learning to live with the pain, and patient shares that I have never been without pain. Have been giving me enough medicine to tolerate it. Before all this started was having a break out pain and ask to step up a few milligrams. But before I could they were asking for a decrease. I don't know how much I can stand. They say that the lower it gets the more my body will start making the natural opioids. She wants me to use over the counter medicine. Doesn't do anything for her. Difficulty getting words out because so upset about it. Frustrated, depressed and anxious. It is so hard. I up to 270 lbs. Over 110 lbs my ideal weight. When I was at ideal weight I could do what I wanted to do. I was athletic. I don't eat hardly anything.  Insurance won't pay for some of the non narcotic medications per NP.  Therapist explored patient frustrations about not getting medications that could possibly help because insurance will not pay for them.  Plan for decrease is maybe they can get down to 60 mg morphine. Was at 100 and already using the ice packs because not working so well. Treatment history has been  that they have been giving increase because not working so well. Built up to high amounts. They have been giving for years and years. Advertisement for medication that cuts headache in two hours. Well give it to me. Has to be on formulary.  Husband hasn't been doing well with COPD. Makes it hard in terms of food he eats. Can't stand along enough to cook. Patient can't sit on stool because you have to move around to cook. Patient able to reframe and says  does the crock pot a lot.    Patient shares quit smoking and therapist pointed this out as a major  accomplishment.  Discussed food to help you with pain, with health including berries with skin on them. Reviewed healthy recipes for diet and adding spices good for pain and health issues such as tumeric.  Therapist assessed patient current functioning per report and processed feelings related to significant stressor of cutting down on her medication.  Guided patient in attitudes that would be helpful for her coping including being told by Dr. Parks Ranger natural opiates will take over to help manage pain.  Focus patient on goal that she has more control of healthy eating to help with management of weight as well as recipes that she enjoys eating.  Reviewed's particular foods and spices that also help with health and pain issues. Focused on strengths such as patient quitting smoking as intervention to help patient see she can accomplish goals if she sets her mind to it. Reviewed and processed feelings related to other stressors such as frustration of not getting treatment she needs due to insurance not paying for certain meds, concerns about husband with COPD. Provided supportive intervention and explored coping strategies to address stressors during session. Suicidal/Homicidal: No  Plan: Return again in 3-4 weeks.2.  Therapist work with patient on CBT strategies to manage pain, use supportive and strength-based interventions, work with patient on coping strategies for stressors  Diagnosis: Axis I:  major depressive disorder, recurrent, moderate, degenerative disc disease, chronic pain syndrome, generalized anxiety disorder    Axis II: No diagnosis    [name], LCSW 12/16/2018

## 2019-01-08 ENCOUNTER — Encounter: Payer: Medicare Other | Attending: Physical Medicine and Rehabilitation | Admitting: Registered Nurse

## 2019-01-08 ENCOUNTER — Encounter: Payer: Self-pay | Admitting: Registered Nurse

## 2019-01-08 ENCOUNTER — Other Ambulatory Visit: Payer: Self-pay

## 2019-01-08 VITALS — BP 151/95 | HR 93 | Temp 97.7°F | Ht 68.0 in | Wt 273.8 lb

## 2019-01-08 DIAGNOSIS — Z79899 Other long term (current) drug therapy: Secondary | ICD-10-CM | POA: Insufficient documentation

## 2019-01-08 DIAGNOSIS — F329 Major depressive disorder, single episode, unspecified: Secondary | ICD-10-CM

## 2019-01-08 DIAGNOSIS — G894 Chronic pain syndrome: Secondary | ICD-10-CM | POA: Insufficient documentation

## 2019-01-08 DIAGNOSIS — Z79891 Long term (current) use of opiate analgesic: Secondary | ICD-10-CM | POA: Diagnosis not present

## 2019-01-08 DIAGNOSIS — M7061 Trochanteric bursitis, right hip: Secondary | ICD-10-CM

## 2019-01-08 DIAGNOSIS — M1712 Unilateral primary osteoarthritis, left knee: Secondary | ICD-10-CM | POA: Diagnosis not present

## 2019-01-08 DIAGNOSIS — M546 Pain in thoracic spine: Secondary | ICD-10-CM | POA: Diagnosis not present

## 2019-01-08 DIAGNOSIS — M47816 Spondylosis without myelopathy or radiculopathy, lumbar region: Secondary | ICD-10-CM | POA: Diagnosis not present

## 2019-01-08 DIAGNOSIS — M1711 Unilateral primary osteoarthritis, right knee: Secondary | ICD-10-CM | POA: Diagnosis not present

## 2019-01-08 DIAGNOSIS — G8929 Other chronic pain: Secondary | ICD-10-CM

## 2019-01-08 DIAGNOSIS — M5136 Other intervertebral disc degeneration, lumbar region: Secondary | ICD-10-CM

## 2019-01-08 DIAGNOSIS — Z760 Encounter for issue of repeat prescription: Secondary | ICD-10-CM | POA: Insufficient documentation

## 2019-01-08 DIAGNOSIS — M7062 Trochanteric bursitis, left hip: Secondary | ICD-10-CM

## 2019-01-08 DIAGNOSIS — Z5181 Encounter for therapeutic drug level monitoring: Secondary | ICD-10-CM | POA: Diagnosis not present

## 2019-01-08 MED ORDER — MORPHINE SULFATE 15 MG PO TABS: 15.0000 mg | ORAL_TABLET | Freq: Four times a day (QID) | ORAL | 0 refills | Status: DC | PRN

## 2019-01-08 MED ORDER — MORPHINE SULFATE ER 60 MG PO TBCR: 60.0000 mg | EXTENDED_RELEASE_TABLET | Freq: Two times a day (BID) | ORAL | 0 refills | Status: DC

## 2019-01-08 MED ORDER — MORPHINE SULFATE ER 30 MG PO TBCR: 30.0000 mg | EXTENDED_RELEASE_TABLET | Freq: Two times a day (BID) | ORAL | 0 refills | Status: DC

## 2019-01-08 NOTE — Progress Notes (Signed)
Subjective:    Patient ID: [name], female    DOB: 1960-07-21, 58 y.o.   MRN: [id number]  HPI: [name] is a 58 y.o. female who returns for follow up appointment for chronic pain and medication refill. She states her pain is located in her lower back, bilateral hips and bilateral knees. She rates her pain 9. Her current exercise regime is walking and performing chair  exercises.  [name] Morphine equivalent is 240.58 MME.  UDS ordered today.   Pain Inventory Average Pain 8 Pain Right Now 9 My pain is constant, sharp, burning, dull, stabbing, tingling and aching  In the last 24 hours, has pain interfered with the following? General activity 10 Relation with others 10 Enjoyment of life 10 What TIME of day is your pain at its worst? varies Sleep (in general) Good  Pain is worse with: walking, bending, standing and some activites Pain improves with: rest, heat/ice, therapy/exercise, medication, TENS and injections Relief from Meds: 5  Mobility use a cane use a walker how many minutes can you walk? 4-5 ability to climb steps?  yes do you drive?  yes  Function not employed: date last employed 02/24/2003 disabled: date disabled 07/07 I need assistance with the following:  bathing, meal prep, household duties and shopping  Neuro/Psych weakness numbness tremor tingling trouble walking spasms dizziness confusion depression anxiety  Prior Studies Any changes since last visit?  no bone scan x-rays CT/MRI  Physicians involved in your care Any changes since last visit?  no Primary care Beatrice Lecher, MD Psychiatrist Franciso Bend, MD Psychologist Percell Boston, LCSW   Family History  Problem Relation Age of Onset  . Depression Mother   . Hypertension Mother   . Hypertension Father   . Heart failure Father   . Diabetes Father   . Heart attack Father   . Colon cancer Neg Hx   . Esophageal cancer Neg Hx   . Stomach cancer Neg  Hx   . Rectal cancer Neg Hx    Social History   Socioeconomic History  . Marital status: Married    Spouse name: [name]  . Number of children: 1  . Years of education: some college  . Highest education level: Not on file  Occupational History  . Occupation: On disability    Employer: UNEMPLOYED  Social Needs  . Financial resource strain: Not on file  . Food insecurity    Worry: Not on file    Inability: Not on file  . Transportation needs    Medical: Not on file    Non-medical: Not on file  Tobacco Use  . Smoking status: Former Smoker    Packs/day: 0.50    Types: Cigarettes    Quit date: 02/06/2016    Years since quitting: 2.9  . Smokeless tobacco: Never Used  . Tobacco comment: going to try e-sticks to quit  Substance and Sexual Activity  . Alcohol use: No    Alcohol/week: 0.0 standard drinks  . Drug use: No  . Sexual activity: Not Currently    Partners: Male  Lifestyle  . Physical activity    Days per week: Not on file    Minutes per session: Not on file  . Stress: Not on file  Relationships  . Social Herbalist on phone: Not on file    Gets together: Not on file    Attends religious service: Not on file    Active member of club or organization:  Not on file    Attends meetings of clubs or organizations: Not on file    Relationship status: Not on file  Other Topics Concern  . Not on file  Social History Narrative   8-10 cups of soda a day.  On disability. No regular exercise.     Past Surgical History:  Procedure Laterality Date  . ABDOMINAL HYSTERECTOMY  May 2004  . Basel Cell Carcinoma  06/2005, 07/2005   nasal tip and reconstruction  . BREAST CYST ASPIRATION    . CARPAL TUNNEL RELEASE  07/1993   both hands  . COLONOSCOPY    . HEMORROIDECTOMY  July 2003  . KNEE ARTHROPLASTY  09/2001, 05/2003   left knee, chondromalasia  . KNEE ARTHROSCOPY  jan 2005   Right knee, tisse release, chrondromalacia  . REPLACEMENT TOTAL KNEE  Nov 2005   Left   .  THYROID LOBECTOMY  01/2005   malignant area removed from right lobe  . THYROID SURGERY     thyroid lobe removal  . TONSILLECTOMY  09/1972  . TUBAL LIGATION  Sept 1999  . Ulnar nerve entrapment  Feb 1995   left elbow   Past Medical History:  Diagnosis Date  . Allergy   . Anxiety   . Cancer ([place])    cancerous nodules on thyroid  . Cancer ([place])    basal cell Cancer-nose  . Chronic pain syndrome   . COPD (chronic obstructive pulmonary disease) ([place])   . Degeneration of lumbar or lumbosacral intervertebral disc   . Depression   . Facet syndrome, lumbar   . GERD (gastroesophageal reflux disease)   . Hyperlipidemia   . Intervertebral lumbar disc disorder with myelopathy, lumbar region   . Lumbosacral spondylosis without myelopathy   . Migraine without aura, with intractable migraine, so stated, without mention of status migrainosus   . Primary localized osteoarthrosis, lower leg   . Thyroid disease    hypothyroid  . Unspecified musculoskeletal disorders and symptoms referable to neck    cervical/trapezius   BP (!) 151/95   Pulse 93   Temp 97.7 F (36.5 C)   Ht 5\' 8"  (1.727 m)   Wt 273 lb 12.8 oz (124.2 kg)   SpO2 95%   BMI 41.63 kg/m   Opioid Risk Score:   Fall Risk Score:  `1  Depression screen PHQ 2/9  Depression screen [facility] 2/9 06/07/2018 01/28/2018 01/28/2018 10/11/2017 10/25/2016 08/25/2016 07/14/2016  Decreased Interest 3 3 3 3 3 3 3   Down, Depressed, Hopeless 3 3 3 3 3 3 3   PHQ - 2 Score 6 6 6 6 6 6 6   Altered sleeping - 3 3 3  - - -  Tired, decreased energy - 3 3 3  - - -  Change in appetite - 3 3 2  - - -  Feeling bad or failure about yourself  - 3 3 3  - - -  Trouble concentrating - 1 1 3  - - -  Moving slowly or fidgety/restless - 0 0 0 - - -  Suicidal thoughts - 2 2 0 - - -  PHQ-9 Score - 21 21 20  - - -  Difficult doing work/chores - Extremely dIfficult Extremely dIfficult Extremely dIfficult - - -  Some encounter information is confidential and restricted. Go to  Review Flowsheets activity to see all data.  Some recent data might be hidden     Review of Systems     Objective:   Physical Exam Vitals signs and nursing note reviewed.  Constitutional:      Appearance: Normal appearance.  Neck:     Musculoskeletal: Normal range of motion and neck supple.     Comments: Cervical Paraspinal Tenderness: C-5-C-6 Cardiovascular:     Rate and Rhythm: Normal rate and regular rhythm.     Pulses: Normal pulses.     Heart sounds: Normal heart sounds.  Pulmonary:     Effort: Pulmonary effort is normal.     Breath sounds: Normal breath sounds.  Musculoskeletal:     Comments: Normal Muscle Bulk and Muscle Testing Reveals:  Upper Extremities: Full ROM and Muscle Strength 5/5 Bilateral AC Joint Tenderness Thoracic Paraspinal Tenderness: T-3-T-6 Mainly Left Side  Lumbar Paraspinal Tenderness: L-3-L-5  Bilateral Greater Trochanter Tenderness Lower Extremities: Decreased ROM and Muscle Strength 5/5 Bilateral Lower Extremities Flexion Produces Pain into her Bilateral Patella's Arises from Table Slowly using cane for support Antalgic Gait   Skin:    General: Skin is warm and dry.  Neurological:     Mental Status: She is alert and oriented to person, place, and time.  Psychiatric:        Mood and Affect: Mood normal.        Behavior: Behavior normal.           Assessment & Plan:  1.Chronic Bilateral Thoracic Back Pain/ Low Back Pain/ Lumbar Facet Arthropathy:RefilledMSIR15mg  one tablet every 6 hoursas needed #105and Begin Slow weaning of Morphine Decrease 100 mg to 90 mg.   MS Contin 60 mg and 30 mg Q 12 hours to equal   90 mg  #60.Continue Gabapentin and Pamelor.01/08/2019 We will continue the opioid monitoring program, this consists of regular clinic visits, examinations, urine drug screen, pill counts as well as use of [place] Controlled Substance Reporting System. 2. Degenerative Disk Disease:Encouraged Continue HEP as  Tolertatedand heat therapy. Continue Current Medication Regime.01/08/2019. 3.Bilateral Greater Trochanteric Bursitis: Continuecurrent treatment regimenwith Heat and Ice Therapy.01/08/2019. 4. Osteoarthritis of Bilateral Knees: Continue current treatment modality with homeexerciseprogramand heat therapy. 01/08/2019. 5. Migraine Headaches:Continue withMigraine Journal.Continue: Aimovig,LamictalandMaxalt. Continue to Monitor.01/08/2019 6. Smoking Cessation: She has quit smokingsince 08/25/2016. Continue to Monitor.01/08/2019 7.Constipation: ContinueCurrent medication regime withLinzess.01/08/2019 8. Muscle Spasm: Continuecurrent medication regime withTizanidine.11/14/2018 9. DepressionAnxiety/ Panic Attacks: Continuecurrent medication regimen and treatment modality.Celexa.Lamictaland Counseling with [name] andDr.Akhtart Psychiatrist.01/08/2019 10. RSD: Continue with current medication Regimewith Gabapentin.01/08/2019  75minutes of face to face patient care time was spent during this visit. All questions were encouraged and answered.  F/U in 1 month

## 2019-01-11 LAB — TOXASSURE SELECT,+ANTIDEPR,UR

## 2019-01-13 ENCOUNTER — Ambulatory Visit (INDEPENDENT_AMBULATORY_CARE_PROVIDER_SITE_OTHER): Payer: Medicare Other | Admitting: Licensed Clinical Social Worker

## 2019-01-13 DIAGNOSIS — M5136 Other intervertebral disc degeneration, lumbar region: Secondary | ICD-10-CM | POA: Diagnosis not present

## 2019-01-13 DIAGNOSIS — F411 Generalized anxiety disorder: Secondary | ICD-10-CM

## 2019-01-13 DIAGNOSIS — G894 Chronic pain syndrome: Secondary | ICD-10-CM

## 2019-01-13 DIAGNOSIS — F331 Major depressive disorder, recurrent, moderate: Secondary | ICD-10-CM
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
## 2019-01-14 ENCOUNTER — Telehealth: Payer: Self-pay | Admitting: *Deleted

## 2019-01-14 NOTE — Telephone Encounter (Signed)
Urine drug screen for this encounter is consistent for prescribed medication 

## 2019-01-20 ENCOUNTER — Other Ambulatory Visit: Payer: Self-pay | Admitting: Registered Nurse

## 2019-01-21 ENCOUNTER — Other Ambulatory Visit: Payer: Self-pay | Admitting: Registered Nurse

## 2019-01-22 ENCOUNTER — Other Ambulatory Visit: Payer: Self-pay | Admitting: *Deleted

## 2019-01-22 MED ORDER — PRAVASTATIN SODIUM 40 MG PO TABS: 40.0000 mg | ORAL_TABLET | Freq: Every day | ORAL | 0 refills | Status: DC

## 2019-01-27 ENCOUNTER — Other Ambulatory Visit: Payer: Self-pay | Admitting: *Deleted

## 2019-01-27 MED ORDER — LEVOTHYROXINE SODIUM 25 MCG PO TABS: ORAL_TABLET | ORAL | 1 refills | Status: DC

## 2019-02-10 ENCOUNTER — Encounter: Payer: Medicare Other | Attending: Physical Medicine and Rehabilitation | Admitting: Registered Nurse

## 2019-02-10 ENCOUNTER — Other Ambulatory Visit: Payer: Self-pay

## 2019-02-10 ENCOUNTER — Encounter: Payer: Self-pay | Admitting: Registered Nurse

## 2019-02-10 VITALS — BP 143/74 | HR 98 | Temp 97.9°F | Ht 68.5 in | Wt 273.0 lb

## 2019-02-10 DIAGNOSIS — M5136 Other intervertebral disc degeneration, lumbar region: Secondary | ICD-10-CM

## 2019-02-10 DIAGNOSIS — Z79899 Other long term (current) drug therapy: Secondary | ICD-10-CM | POA: Diagnosis not present

## 2019-02-10 DIAGNOSIS — M7062 Trochanteric bursitis, left hip: Secondary | ICD-10-CM

## 2019-02-10 DIAGNOSIS — M1711 Unilateral primary osteoarthritis, right knee: Secondary | ICD-10-CM | POA: Diagnosis not present

## 2019-02-10 DIAGNOSIS — M1712 Unilateral primary osteoarthritis, left knee: Secondary | ICD-10-CM | POA: Diagnosis not present

## 2019-02-10 DIAGNOSIS — Z5181 Encounter for therapeutic drug level monitoring: Secondary | ICD-10-CM | POA: Diagnosis not present

## 2019-02-10 DIAGNOSIS — Z760 Encounter for issue of repeat prescription: Secondary | ICD-10-CM | POA: Diagnosis not present

## 2019-02-10 DIAGNOSIS — M546 Pain in thoracic spine: Secondary | ICD-10-CM

## 2019-02-10 DIAGNOSIS — G894 Chronic pain syndrome: Secondary | ICD-10-CM | POA: Diagnosis not present

## 2019-02-10 DIAGNOSIS — Z79891 Long term (current) use of opiate analgesic: Secondary | ICD-10-CM | POA: Insufficient documentation

## 2019-02-10 DIAGNOSIS — F329 Major depressive disorder, single episode, unspecified: Secondary | ICD-10-CM

## 2019-02-10 DIAGNOSIS — G43119 Migraine with aura, intractable, without status migrainosus: Secondary | ICD-10-CM

## 2019-02-10 DIAGNOSIS — G8929 Other chronic pain: Secondary | ICD-10-CM

## 2019-02-10 DIAGNOSIS — M47816 Spondylosis without myelopathy or radiculopathy, lumbar region: Secondary | ICD-10-CM | POA: Diagnosis not present

## 2019-02-10 DIAGNOSIS — M7061 Trochanteric bursitis, right hip: Secondary | ICD-10-CM | POA: Diagnosis not present

## 2019-02-10 MED ORDER — MORPHINE SULFATE 15 MG PO TABS: 15.0000 mg | ORAL_TABLET | Freq: Four times a day (QID) | ORAL | 0 refills | Status: DC | PRN

## 2019-02-10 MED ORDER — MORPHINE SULFATE ER 60 MG PO TBCR: 60.0000 mg | EXTENDED_RELEASE_TABLET | Freq: Two times a day (BID) | ORAL | 0 refills | Status: DC

## 2019-02-10 MED ORDER — MORPHINE SULFATE ER 30 MG PO TBCR: 30.0000 mg | EXTENDED_RELEASE_TABLET | Freq: Two times a day (BID) | ORAL | 0 refills | Status: DC

## 2019-02-10 NOTE — Progress Notes (Signed)
Subjective:    Patient ID: [name], female    DOB: 1961-02-01, 59 y.o.   MRN: [id number]  HPI: [name] is a 59 y.o. female who returns for follow up appointment for chronic pain and medication refill. She states her pain is located in her mid- lower back and bilateral hip pain, also reports she has a headache for the last three days. She rates her pain 8. Her  current exercise regime is walking and performing stretching exercises.  Ms. Bullard Morphine equivalent is 240.00  MME.  Last UDS was Performed on 01/08/2019, it was consistent.    Pain Inventory Average Pain 8 Pain Right Now 8 My pain is constant, sharp, burning, dull, stabbing, tingling and aching  In the last 24 hours, has pain interfered with the following? General activity 10 Relation with others 10 Enjoyment of life 10 What TIME of day is your pain at its worst? all Sleep (in general) Poor  Pain is worse with: walking, standing and some activites Pain improves with: rest, heat/ice, therapy/exercise, medication, TENS and injections Relief from Meds: 6  Mobility walk with assistance use a cane use a walker how many minutes can you walk? 5 ability to climb steps?  yes do you drive?  yes Do you have any goals in this area?  yes  Function disabled: date disabled . I need assistance with the following:  bathing, meal prep, household duties and shopping Do you have any goals in this area?  yes  Neuro/Psych weakness numbness tremor tingling trouble walking spasms dizziness confusion depression anxiety  Prior Studies Any changes since last visit?  no  Physicians involved in your care Any changes since last visit?  no   Family History  Problem Relation Age of Onset  . Depression Mother   . Hypertension Mother   . Hypertension Father   . Heart failure Father   . Diabetes Father   . Heart attack Father   . Colon cancer Neg Hx   . Esophageal cancer Neg Hx   . Stomach  cancer Neg Hx   . Rectal cancer Neg Hx    Social History   Socioeconomic History  . Marital status: Married    Spouse name: [name]  . Number of children: 1  . Years of education: some college  . Highest education level: Not on file  Occupational History  . Occupation: On disability    Employer: UNEMPLOYED  Tobacco Use  . Smoking status: Former Smoker    Packs/day: 0.50    Types: Cigarettes    Quit date: 02/06/2016    Years since quitting: 3.0  . Smokeless tobacco: Never Used  . Tobacco comment: going to try e-sticks to quit  Substance and Sexual Activity  . Alcohol use: No    Alcohol/week: 0.0 standard drinks  . Drug use: No  . Sexual activity: Not Currently    Partners: Male  Other Topics Concern  . Not on file  Social History Narrative   8-10 cups of soda a day.  On disability. No regular exercise.     Social Determinants of Health   Financial Resource Strain:   . Difficulty of Paying Living Expenses: Not on file  Food Insecurity:   . Worried About Charity fundraiser in the Last Year: Not on file  . Ran Out of Food in the Last Year: Not on file  Transportation Needs:   . Lack of Transportation (Medical): Not on file  . Lack of  Transportation (Non-Medical): Not on file  Physical Activity:   . Days of Exercise per Week: Not on file  . Minutes of Exercise per Session: Not on file  Stress:   . Feeling of Stress : Not on file  Social Connections:   . Frequency of Communication with Friends and Family: Not on file  . Frequency of Social Gatherings with Friends and Family: Not on file  . Attends Religious Services: Not on file  . Active Member of Clubs or Organizations: Not on file  . Attends Archivist Meetings: Not on file  . Marital Status: Not on file   Past Surgical History:  Procedure Laterality Date  . ABDOMINAL HYSTERECTOMY  May 2004  . Basel Cell Carcinoma  06/2005, 07/2005   nasal tip and reconstruction  . BREAST CYST ASPIRATION    . CARPAL  TUNNEL RELEASE  07/1993   both hands  . COLONOSCOPY    . HEMORROIDECTOMY  July 2003  . KNEE ARTHROPLASTY  09/2001, 05/2003   left knee, chondromalasia  . KNEE ARTHROSCOPY  jan 2005   Right knee, tisse release, chrondromalacia  . REPLACEMENT TOTAL KNEE  Nov 2005   Left   . THYROID LOBECTOMY  01/2005   malignant area removed from right lobe  . THYROID SURGERY     thyroid lobe removal  . TONSILLECTOMY  09/1972  . TUBAL LIGATION  Sept 1999  . Ulnar nerve entrapment  Feb 1995   left elbow   Past Medical History:  Diagnosis Date  . Allergy   . Anxiety   . Cancer ([place])    cancerous nodules on thyroid  . Cancer ([place])    basal cell Cancer-nose  . Chronic pain syndrome   . COPD (chronic obstructive pulmonary disease) ([place])   . Degeneration of lumbar or lumbosacral intervertebral disc   . Depression   . Facet syndrome, lumbar   . GERD (gastroesophageal reflux disease)   . Hyperlipidemia   . Intervertebral lumbar disc disorder with myelopathy, lumbar region   . Lumbosacral spondylosis without myelopathy   . Migraine without aura, with intractable migraine, so stated, without mention of status migrainosus   . Primary localized osteoarthrosis, lower leg   . Thyroid disease    hypothyroid  . Unspecified musculoskeletal disorders and symptoms referable to neck    cervical/trapezius   BP (!) 143/74   Pulse 98   Temp 97.9 F (36.6 C)   Ht 5' 8.5" (1.74 m)   Wt 273 lb (123.8 kg)   SpO2 93%   BMI 40.91 kg/m   Opioid Risk Score:   Fall Risk Score:  `1  Depression screen PHQ 2/9  Depression screen [facility] 2/9 06/07/2018 01/28/2018 01/28/2018 10/11/2017 10/25/2016 08/25/2016 07/14/2016  Decreased Interest 3 3 3 3 3 3 3   Down, Depressed, Hopeless 3 3 3 3 3 3 3   PHQ - 2 Score 6 6 6 6 6 6 6   Altered sleeping - 3 3 3  - - -  Tired, decreased energy - 3 3 3  - - -  Change in appetite - 3 3 2  - - -  Feeling bad or failure about yourself  - 3 3 3  - - -  Trouble concentrating - 1 1 3  - - -  Moving  slowly or fidgety/restless - 0 0 0 - - -  Suicidal thoughts - 2 2 0 - - -  PHQ-9 Score - 21 21 20  - - -  Difficult doing work/chores - Extremely dIfficult Extremely dIfficult  Extremely dIfficult - - -  Some encounter information is confidential and restricted. Go to Review Flowsheets activity to see all data.  Some recent data might be hidden    Review of Systems  Constitutional: Positive for unexpected weight change.  HENT: Negative.   Eyes: Positive for photophobia and visual disturbance.  Respiratory: Negative.   Cardiovascular: Positive for leg swelling.  Gastrointestinal: Positive for nausea.  Endocrine: Negative.   Genitourinary: Negative.   Musculoskeletal: Positive for arthralgias, back pain, gait problem, myalgias, neck pain and neck stiffness.       Spasms   Skin: Negative.   Allergic/Immunologic: Negative.   Neurological: Positive for tremors, weakness, numbness and headaches.       Tingling  Hematological: Negative.   Psychiatric/Behavioral: Positive for confusion and dysphoric mood. The patient is nervous/anxious.        Objective:   Physical Exam Vitals and nursing note reviewed.  Constitutional:      Appearance: Normal appearance. She is obese.  Cardiovascular:     Rate and Rhythm: Normal rate and regular rhythm.     Pulses: Normal pulses.     Heart sounds: Normal heart sounds.  Musculoskeletal:     Cervical back: Normal range of motion and neck supple.     Comments: Normal Muscle Bulk and Muscle Testing Reveals:  Upper Extremities:Full  ROM and Muscle Strength 5/5 Thoracic Paraspinal Tenderness: T-7-T-9 Lumbar Paraspinal Tenderness: L-3-L-5 Lower Extremities: Decreased ROM and Muscle Strength 5/5 Arises from Table Slowly using cane for support Antalgic  Gait   Skin:    General: Skin is warm and dry.  Neurological:     Mental Status: She is alert and oriented to person, place, and time.  Psychiatric:        Mood and Affect: Mood normal.         Behavior: Behavior normal.           Assessment & Plan:  1.Chronic Bilateral Thoracic Back Pain/ Low Back Pain/ Lumbar Facet Arthropathy:RefilledMSIR15mg  one tablet every 6 hoursas needed #105andContinue with Slow weaning ofMorphine. Refilled: MS Contin 60 mg and 30 mg Q 12 hours to equal 90 mg #60.Continue Gabapentin and Pamelor.02/10/2019 We will continue the opioid monitoring program, this consists of regular clinic visits, examinations, urine drug screen, pill counts as well as use of [place] Controlled Substance Reporting System. 2. Degenerative Disk Disease:Encouraged Continue HEP as Tolertatedand heat therapy. Continue Current Medication Regime.02/10/2019. 3.Bilateral Greater Trochanteric Bursitis: Continuecurrent treatment regimenwith Heat and Ice Therapy.02/10/2019. 4. Osteoarthritis of Bilateral Knees: Continue current treatment modality with homeexerciseprogramand heat therapy.02/10/2019 5. Migraine Headaches:Continue withMigraine Journal.Continue: Aimovig,LamictalandMaxalt. Continue to Monitor.02/10/2019 6. Smoking Cessation: She has quit smokingsince 08/25/2016. Continue to Monitor.02/10/2019 7.Constipation: ContinueCurrent medication regime withLinzess.01/08/2019 8. Muscle Spasm: Continuecurrent medication regime withTizanidine.02/10/2019 9. DepressionAnxiety/ Panic Attacks: Continuecurrent medication regimen and treatment modality.Celexa.Lamictaland Counseling with [name] andDr.Akhtart Psychiatrist.02/10/2019 10. RSD: Continue with current medication Regimewith Gabapentin.02/10/2019  63minutes of face to face patient care time was spent during this visit. All questions were encouraged and answered.  F/U in 1 month

## 2019-02-12 ENCOUNTER — Ambulatory Visit (INDEPENDENT_AMBULATORY_CARE_PROVIDER_SITE_OTHER): Payer: Medicare Other | Admitting: Licensed Clinical Social Worker

## 2019-02-12 DIAGNOSIS — F411 Generalized anxiety disorder: Secondary | ICD-10-CM | POA: Diagnosis not present

## 2019-02-12 DIAGNOSIS — M5136 Other intervertebral disc degeneration, lumbar region: Secondary | ICD-10-CM

## 2019-02-12 DIAGNOSIS — M51369 Other intervertebral disc degeneration, lumbar region without mention of lumbar back pain or lower extremity pain: Secondary | ICD-10-CM

## 2019-02-12 DIAGNOSIS — F331 Major depressive disorder, recurrent, moderate: Secondary | ICD-10-CM

## 2019-02-12 DIAGNOSIS — G894 Chronic pain syndrome: Secondary | ICD-10-CM | POA: Diagnosis not present
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
## 2019-02-14 ENCOUNTER — Encounter: Payer: Self-pay | Admitting: Registered Nurse

## 2019-02-21 ENCOUNTER — Other Ambulatory Visit: Payer: Self-pay

## 2019-02-21 ENCOUNTER — Ambulatory Visit (INDEPENDENT_AMBULATORY_CARE_PROVIDER_SITE_OTHER): Payer: Medicare Other | Admitting: Licensed Clinical Social Worker

## 2019-02-21 DIAGNOSIS — M5136 Other intervertebral disc degeneration, lumbar region: Secondary | ICD-10-CM | POA: Diagnosis not present

## 2019-02-21 DIAGNOSIS — F331 Major depressive disorder, recurrent, moderate: Secondary | ICD-10-CM | POA: Diagnosis not present

## 2019-02-21 DIAGNOSIS — F411 Generalized anxiety disorder: Secondary | ICD-10-CM | POA: Diagnosis not present

## 2019-02-21 DIAGNOSIS — G894 Chronic pain syndrome: Secondary | ICD-10-CM | POA: Diagnosis not present
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
## 2019-02-27 ENCOUNTER — Encounter (HOSPITAL_COMMUNITY): Payer: Self-pay | Admitting: Psychiatry

## 2019-02-27 ENCOUNTER — Ambulatory Visit (INDEPENDENT_AMBULATORY_CARE_PROVIDER_SITE_OTHER): Payer: Medicare Other | Admitting: Psychiatry

## 2019-02-27 ENCOUNTER — Other Ambulatory Visit: Payer: Self-pay

## 2019-02-27 DIAGNOSIS — G894 Chronic pain syndrome: Secondary | ICD-10-CM

## 2019-02-27 DIAGNOSIS — F331 Major depressive disorder, recurrent, moderate: Secondary | ICD-10-CM

## 2019-02-27 DIAGNOSIS — F411 Generalized anxiety disorder: Secondary | ICD-10-CM | POA: Diagnosis not present

## 2019-02-27 DIAGNOSIS — M5136 Other intervertebral disc degeneration, lumbar region: Secondary | ICD-10-CM

## 2019-02-27 NOTE — Progress Notes (Signed)
[facility] Outpatient Follow up visit  Patient Identification: [name] MRN:  [id number] Date of Evaluation:  02/27/2019 Referral Source: primary care Chief Complaint:   depression follow up  Visit Diagnosis:    ICD-10-CM   1. Major depressive disorder, recurrent episode, moderate (HCC)  F33.1   2. Chronic pain syndrome  G89.4   3. GAD (generalized anxiety disorder)  F41.1   4. Degenerative disc disease, lumbar  M51.36    I connected with [name] on 02/27/19 at 10:00 AM EST by telephone and verified that I am speaking with the correct person using two identifiers.   I discussed the limitations, risks, security and privacy concerns of performing an evaluation and management service by telephone and the availability of in person appointments. I also discussed with the patient that there may be a patient responsible charge related to this service. The patient expressed understanding and agreed to proceed.  History of Present Illness:  59 years old white married female. Referred by pain clinic for counselling and depression  Patient suffers from chronic back condition multiple pain conditions including at the joints surgery in the past including knee replacement.  She takes morphine 4 times a day also on gabapentin.  She takes Lamictal for migraine referred for possible counseling  Pain effects mood, slowly working on lowering morphine dose reduced by 10mg   Mobility is limited at times, plans to discuss other pain options as well Mostly getting therapy from our office, meds reviewed but pcp refills them Celexa keeping some balance in mood  Modifying factor: husband  Tolerating medications.  No rash Duration : more then 20 years  Aggravating F:multiple medical conditions , arthirits, Degenerative joints disease. Chronic back condition, migraine Limited movements   Past Psychiatric History: depression, anxiety, chronic pain  Previous Psychotropic Medications: Yes    Substance Abuse History in the last 12 months:  No.  On pain meds as prescribed by provider for arthritis  Past Medical History:  Past Medical History:  Diagnosis Date  . Allergy   . Anxiety   . Cancer ([place])    cancerous nodules on thyroid  . Cancer ([place])    basal cell Cancer-nose  . Chronic pain syndrome   . COPD (chronic obstructive pulmonary disease) ([place])   . Degeneration of lumbar or lumbosacral intervertebral disc   . Depression   . Facet syndrome, lumbar   . GERD (gastroesophageal reflux disease)   . Hyperlipidemia   . Intervertebral lumbar disc disorder with myelopathy, lumbar region   . Lumbosacral spondylosis without myelopathy   . Migraine without aura, with intractable migraine, so stated, without mention of status migrainosus   . Primary localized osteoarthrosis, lower leg   . Thyroid disease    hypothyroid  . Unspecified musculoskeletal disorders and symptoms referable to neck    cervical/trapezius    Past Surgical History:  Procedure Laterality Date  . ABDOMINAL HYSTERECTOMY  May 2004  . Basel Cell Carcinoma  06/2005, 07/2005   nasal tip and reconstruction  . BREAST CYST ASPIRATION    . CARPAL TUNNEL RELEASE  07/1993   both hands  . COLONOSCOPY    . HEMORROIDECTOMY  July 2003  . KNEE ARTHROPLASTY  09/2001, 05/2003   left knee, chondromalasia  . KNEE ARTHROSCOPY  jan 2005   Right knee, tisse release, chrondromalacia  . REPLACEMENT TOTAL KNEE  Nov 2005   Left   . THYROID LOBECTOMY  01/2005   malignant area removed from right lobe  . THYROID SURGERY  thyroid lobe removal  . TONSILLECTOMY  09/1972  . TUBAL LIGATION  Sept 1999  . Ulnar nerve entrapment  Feb 1995   left elbow    Family Psychiatric History: mom : depression  Family History:  Family History  Problem Relation Age of Onset  . Depression Mother   . Hypertension Mother   . Hypertension Father   . Heart failure Father   . Diabetes Father   . Heart attack Father   . Colon cancer Neg  Hx   . Esophageal cancer Neg Hx   . Stomach cancer Neg Hx   . Rectal cancer Neg Hx     Social History:   Social History   Socioeconomic History  . Marital status: Married    Spouse name: [name]  . Number of children: 1  . Years of education: some college  . Highest education level: Not on file  Occupational History  . Occupation: On disability    Employer: UNEMPLOYED  Tobacco Use  . Smoking status: Former Smoker    Packs/day: 0.50    Types: Cigarettes    Quit date: 02/06/2016    Years since quitting: 3.0  . Smokeless tobacco: Never Used  . Tobacco comment: going to try e-sticks to quit  Substance and Sexual Activity  . Alcohol use: No    Alcohol/week: 0.0 standard drinks  . Drug use: No  . Sexual activity: Not Currently    Partners: Male  Other Topics Concern  . Not on file  Social History Narrative   8-10 cups of soda a day.  On disability. No regular exercise.     Social Determinants of Health   Financial Resource Strain:   . Difficulty of Paying Living Expenses: Not on file  Food Insecurity:   . Worried About Charity fundraiser in the Last Year: Not on file  . Ran Out of Food in the Last Year: Not on file  Transportation Needs:   . Lack of Transportation (Medical): Not on file  . Lack of Transportation (Non-Medical): Not on file  Physical Activity:   . Days of Exercise per Week: Not on file  . Minutes of Exercise per Session: Not on file  Stress:   . Feeling of Stress : Not on file  Social Connections:   . Frequency of Communication with Friends and Family: Not on file  . Frequency of Social Gatherings with Friends and Family: Not on file  . Attends Religious Services: Not on file  . Active Member of Clubs or Organizations: Not on file  . Attends Archivist Meetings: Not on file  . Marital Status: Not on file      Allergies:   Allergies  Allergen Reactions  . Aspirin Other (See Comments)    Abdominal bleeding   . Ibuprofen Swelling   . Imitrex [Sumatriptan Base] Other (See Comments)    Drops BP   . Norvasc [Amlodipine Besylate] Swelling  . Nsaids Swelling  . Tape     Skin blisters under surgical tape  . Topamax [Topiramate] Other (See Comments)    Hair loss    Metabolic Disorder Labs: Lab Results  Component Value Date   HGBA1C 6.1 (H) 10/11/2017   MPG 128 10/11/2017   MPG 131 05/26/2015   No results found for: PROLACTIN Lab Results  Component Value Date   CHOL 131 10/11/2017   TRIG 162 (H) 10/11/2017   HDL 47 (L) 10/11/2017   CHOLHDL 2.8 10/11/2017   VLDL 46 (H) 05/26/2015  [place] 60 10/11/2017   LDLCALC 67 05/26/2015     Current Medications: Current Outpatient Medications  Medication Sig Dispense Refill  . citalopram (CELEXA) 40 MG tablet TAKE 1 TABLET(40 MG) BY MOUTH AT BEDTIME 30 tablet 5  . Erenumab-aooe (AIMOVIG) 70 MG/ML SOAJ Inject 70 mg into the skin every 30 (thirty) days. 3 pen 3  . gabapentin (NEURONTIN) 800 MG tablet TAKE 1 TABLET(800 MG) BY MOUTH FOUR TIMES DAILY 120 tablet 5  . lactobacillus acidophilus & bulgar (LACTINEX) chewable tablet Chew 1 tablet by mouth 3 (three) times daily with meals.  0  . lamoTRIgine (LAMICTAL) 25 MG tablet TAKE 1 TABLET(25 MG) BY MOUTH DAILY 30 tablet 2  . levothyroxine (SYNTHROID) 25 MCG tablet TAKE 1 TABLET BY MOUTH DAILY BEFORE BREAKFAST 90 tablet 1  . LINZESS 145 MCG CAPS capsule TAKE 1 CAPSULE BY MOUTH DAILY 30 capsule 3  . morphine (MS CONTIN) 30 MG 12 hr tablet Take 1 tablet (30 mg total) by mouth every 12 (twelve) hours. 60 tablet 0  . morphine (MS CONTIN) 60 MG 12 hr tablet Take 1 tablet (60 mg total) by mouth every 12 (twelve) hours. 60 tablet 0  . morphine (MSIR) 15 MG tablet Take 1 tablet (15 mg total) by mouth every 6 (six) hours as needed for severe pain. Do Not Fill Before 02/14/2019 105 tablet 0  . nortriptyline (PAMELOR) 50 MG capsule TAKE 2 CAPSULES BY MOUTH AT BEDTIME 180 capsule 1  . pravastatin (PRAVACHOL) 40 MG tablet Take 1 tablet  (40 mg total) by mouth daily. LAST REFILL MUST SCHEDULE AND KEEP APPOINTMENT LABS REQUIRED FOR REFILLS 30 tablet 0  . promethazine (PHENERGAN) 12.5 MG tablet TAKE 1 TABLET(12.5 MG) BY MOUTH EVERY 12 HOURS 30 tablet 2  . rizatriptan (MAXALT) 10 MG tablet TAKE 1 TABLET BY MOUTH AS NEEDED MAY REPEAT AFTER 2 HOURS AS NEEDED 30 tablet 2  . tiZANidine (ZANAFLEX) 2 MG tablet TAKE 1 TABLET BY MOUTH THREE TIMES DAILY AS NEEDED FOR MUSCLE SPASMS 90 tablet 5   No current facility-administered medications for this visit.     Psychiatric Specialty Exam: Review of Systems  Constitutional: Positive for malaise/fatigue.  Cardiovascular: Negative for palpitations.  Musculoskeletal: Positive for back pain and myalgias. Negative for neck pain.  Skin: Negative for rash.    There were no vitals taken for this visit.There is no height or weight on file to calculate BMI.  General Appearance:   Eye Contact:   Speech:  Slow  Volume:  Normal  Mood: fair  Affect: constricted  Thought Process:  Goal Directed  Orientation:  Full (Time, Place, and Person)  Thought Content:  Rumination  Suicidal Thoughts:  No  Homicidal Thoughts:  No  Memory:  Immediate;   Fair Recent;   Fair  Judgement:  Fair  Insight:  Fair  Psychomotor Activity:  Decreased  Concentration:  Concentration: Fair and Attention Span: Fair  Recall:  [employer] of Knowledge:Good  Language: Good  Akathisia:  No  Handed:  Right  AIMS (if indicated):    Assets:  Desire for Improvement  ADL's:  Intact  Cognition: WNL  Sleep:  variable    Treatment Plan Summary: Medication management and Plan as follows  MDD, moderate recurrent; baseline, not worse. Continue celexa, gets from primary care [place] ,. Continue gabapentin, celexa  Supportive husband  Chronic back pain and ARthritis: on meds by providers.  Continue supportive andCBT with therapist Has meds Fu 5-6 m. Questions addressed  I discussed the assessment and treatment  plan with the patient. The patient was provided an opportunity to ask questions and all were answered. The patient agreed with the plan and demonstrated an understanding of the instructions.   The patient was advised to call back or seek an in-person evaluation if the symptoms worsen or if the condition fails to improve as anticipated.   Merian Capron, MD 1/21/202110:10 AM

## 2019-03-03 ENCOUNTER — Other Ambulatory Visit: Payer: Self-pay | Admitting: Physical Medicine & Rehabilitation

## 2019-03-06 ENCOUNTER — Ambulatory Visit (INDEPENDENT_AMBULATORY_CARE_PROVIDER_SITE_OTHER): Payer: Medicare Other | Admitting: Licensed Clinical Social Worker

## 2019-03-06 DIAGNOSIS — G894 Chronic pain syndrome: Secondary | ICD-10-CM | POA: Diagnosis not present

## 2019-03-06 DIAGNOSIS — F411 Generalized anxiety disorder: Secondary | ICD-10-CM | POA: Diagnosis not present

## 2019-03-06 DIAGNOSIS — M5136 Other intervertebral disc degeneration, lumbar region: Secondary | ICD-10-CM | POA: Diagnosis not present

## 2019-03-06 DIAGNOSIS — F331 Major depressive disorder, recurrent, moderate: Secondary | ICD-10-CM

## 2019-03-06 NOTE — Progress Notes (Signed)
Virtual Visit via Telephone Note  I connected with [name] on 03/06/19 at  8:00 AM EST by telephone and verified that I am speaking with the correct person using two identifiers.   I discussed the limitations, risks, security and privacy concerns of performing an evaluation and management service by telephone and the availability of in person appointments. I also discussed with the patient that there may be a patient responsible charge related to this service. The patient expressed understanding and agreed to proceed.   I discussed the assessment and treatment plan with the patient. The patient was provided an opportunity to ask questions and all were answered. The patient agreed with the plan and demonstrated an understanding of the instructions.   The patient was advised to call back or seek an in-person evaluation if the symptoms worsen or if the condition fails to improve as anticipated.  I provided 52 minutes of non-face-to-face time during this encounter.  THERAPIST PROGRESS NOTE  Session Time: 8:00 AM to 8:55 AM  Participation Level: Active  Behavioral Response: CasualAlertDepressed  Type of Therapy: Individual Therapy  Treatment Goals addressed:  Decrease in depression, anxiety and panic  Interventions: Solution Focused, Strength-based, Supportive, Reframing and Other: coping  Summary: [name] is a 59 y.o. female who presents with talking about her enjoyment of books. Allergies giving her a fit in the last couple of days, lot of pain in lower back really bad in last couple of days. Thought of going to hospital it got so bad, Providers have been backing off of medicine for pain. Shares she wishes they stop changing it and let it be. Can tolerate it. Always have pain, had it since it started. Ice pack on knees, heating pad on back and hips. Described it as a nuisance managing it. Have bursitis something new about a year ago with the hips. All this started in  2003-2004. Bad knee problem with right knee. Surgery to get cartilage out. Carpel tunnel in hands. Both elbows done. Thought she figured what was wrong with knee. Still having the pain. Doctor who did the surgery didn't know what was going on. First went to pain doctor PCP sent. Same group that she goes to now. Been there 20 years. Has RSD-reflux sympathetic dystrophy. Fibromyalgia sort of physical but mental. Not sure what starts it usually a big event. Only can treat symptoms. Something like the surgery would be a big event. Seeing doctor soon and wants to know about different medications that would help with pain that she has heard about. Allergic to NSAIDS can't have anything that is aspirin based. Know pain medicine out there but iInsurance won't pay for it. Medicine doesn't work for migraines 1 out of 10 times.  Gets mad and frustrated about situation with daughter. Doesn't know how to fix it if it doesn't know what it is about. Became tearful in session. Shares she is down and doesn't know if can get back up. I feel the one is being punished. Other daughter is really mad at her. Thinking about fixing things up with inheritance. Thought of getting a RV.  Therapist encouraged her with with goals of helping her get out of the house being able to get away would be helpful for her mental health.  Suicidal/Homicidal: No  Therapist Response: Therapist reviewed symptoms, discussed stressors, facilitated patient's expression of thoughts and feelings related to stressors.  Reviewed symptoms and identified focusing on psychosomatic symptoms specific intervention geared to mental source impacted physical symptoms.  Reviewed treatment plan and patient gave verbal consent to complete virtually.  Therapist used reframing to address patient's depression anger related to relationship with daughter explained her daughter has worked through her issues, often ends up being longer for loved ones who want to be night but  therapist experiences often after they work through process they have willingness to connect.  Stress is helpful in the session for patient to release anger and emotions to help her work through feelings encourage patient and helpful coping strategies considering possibilities of being able to get away with a camper, talking to Dr. About medications to help with pain.  Utilize strength-based interventions and putting outpatients loving and kind nature and provided supportive interventions  Plan: Return again in 2 weeks.2.Work on decrease in depression, address psychosomatic symptoms, supportive interventions  Diagnosis: Axis I:   major depressive disorder, recurrent, moderate, degenerative disc disease, chronic pain syndrome, generalized anxiety disorder    Axis II: No diagnosis    [name], LCSW 03/06/2019

## 2019-03-12 ENCOUNTER — Encounter: Payer: Self-pay | Admitting: Physical Medicine & Rehabilitation

## 2019-03-12 ENCOUNTER — Other Ambulatory Visit: Payer: Self-pay

## 2019-03-12 ENCOUNTER — Encounter: Payer: Medicare Other | Attending: Physical Medicine and Rehabilitation | Admitting: Physical Medicine & Rehabilitation

## 2019-03-12 DIAGNOSIS — Z760 Encounter for issue of repeat prescription: Secondary | ICD-10-CM | POA: Insufficient documentation

## 2019-03-12 DIAGNOSIS — Z79899 Other long term (current) drug therapy: Secondary | ICD-10-CM

## 2019-03-12 DIAGNOSIS — Z5181 Encounter for therapeutic drug level monitoring: Secondary | ICD-10-CM

## 2019-03-12 DIAGNOSIS — M1712 Unilateral primary osteoarthritis, left knee: Secondary | ICD-10-CM | POA: Diagnosis not present

## 2019-03-12 DIAGNOSIS — G894 Chronic pain syndrome: Secondary | ICD-10-CM

## 2019-03-12 DIAGNOSIS — Z79891 Long term (current) use of opiate analgesic: Secondary | ICD-10-CM | POA: Diagnosis not present

## 2019-03-12 DIAGNOSIS — M1711 Unilateral primary osteoarthritis, right knee: Secondary | ICD-10-CM

## 2019-03-12 MED ORDER — MORPHINE SULFATE ER 30 MG PO TBCR: 30.0000 mg | EXTENDED_RELEASE_TABLET | Freq: Two times a day (BID) | ORAL | 0 refills | Status: DC

## 2019-03-12 MED ORDER — MORPHINE SULFATE ER 60 MG PO TBCR: 60.0000 mg | EXTENDED_RELEASE_TABLET | Freq: Two times a day (BID) | ORAL | 0 refills | Status: DC

## 2019-03-12 MED ORDER — MORPHINE SULFATE 15 MG PO TABS: 15.0000 mg | ORAL_TABLET | Freq: Four times a day (QID) | ORAL | 0 refills | Status: DC | PRN

## 2019-03-12 MED ORDER — AIMOVIG 140 MG/ML ~~LOC~~ SOAJ: 140.0000 mg | SUBCUTANEOUS | 3 refills | Status: DC

## 2019-03-12 NOTE — Progress Notes (Signed)
Subjective:    Patient ID: [name], female    DOB: 1960/02/10, 59 y.o.   MRN: [id number]  HPI   [name] is here in follow up of her chronic pain.  She states that has been a rough couple months due to her back and legs.  The cold weather certainly does not help things.  We have been trying to decrease her narcotic load over the last few months.  Her low back bothers her as well.  She states that she is trying to eat better and exercise daily but still has problems losing the weight.  Remains on morphine sulfate immediate release as well as MS Contin for her pain control.  She has been on Aimovig since March and has not noticed a big change in her headaches.  She asked me when she might expect to see some improvement with this.  I last saw her in March 2020.  She maintains follow-up with a psychologist for emotional support.  Pain Inventory Average Pain 9 Pain Right Now 8 My pain is constant, sharp, burning, dull, stabbing, tingling and aching  In the last 24 hours, has pain interfered with the following? General activity 10 Relation with others 10 Enjoyment of life 10 What TIME of day is your pain at its worst? daytime Sleep (in general) Poor  Pain is worse with: walking, bending, standing and some activites Pain improves with: rest, heat/ice, medication, TENS and injections Relief from Meds: 4  Mobility walk with assistance use a cane use a walker ability to climb steps?  yes do you drive?  yes  Function disabled: date disabled . I need assistance with the following:  bathing, meal prep, household duties and shopping  Neuro/Psych bladder control problems weakness numbness tremor tingling trouble walking spasms confusion depression anxiety  Prior Studies Any changes since last visit?  no  Physicians involved in your care Any changes since last visit?  no   Family History  Problem Relation Age of Onset  . Depression Mother   . Hypertension Mother     . Hypertension Father   . Heart failure Father   . Diabetes Father   . Heart attack Father   . Colon cancer Neg Hx   . Esophageal cancer Neg Hx   . Stomach cancer Neg Hx   . Rectal cancer Neg Hx    Social History   Socioeconomic History  . Marital status: Married    Spouse name: [name]  . Number of children: 1  . Years of education: some college  . Highest education level: Not on file  Occupational History  . Occupation: On disability    Employer: UNEMPLOYED  Tobacco Use  . Smoking status: Former Smoker    Packs/day: 0.50    Types: Cigarettes    Quit date: 02/06/2016    Years since quitting: 3.0  . Smokeless tobacco: Never Used  . Tobacco comment: going to try e-sticks to quit  Substance and Sexual Activity  . Alcohol use: No    Alcohol/week: 0.0 standard drinks  . Drug use: No  . Sexual activity: Not Currently    Partners: Male  Other Topics Concern  . Not on file  Social History Narrative   8-10 cups of soda a day.  On disability. No regular exercise.     Social Determinants of Health   Financial Resource Strain:   . Difficulty of Paying Living Expenses: Not on file  Food Insecurity:   . Worried About Estate manager/land agent  of Food in the Last Year: Not on file  . Ran Out of Food in the Last Year: Not on file  Transportation Needs:   . Lack of Transportation (Medical): Not on file  . Lack of Transportation (Non-Medical): Not on file  Physical Activity:   . Days of Exercise per Week: Not on file  . Minutes of Exercise per Session: Not on file  Stress:   . Feeling of Stress : Not on file  Social Connections:   . Frequency of Communication with Friends and Family: Not on file  . Frequency of Social Gatherings with Friends and Family: Not on file  . Attends Religious Services: Not on file  . Active Member of Clubs or Organizations: Not on file  . Attends Archivist Meetings: Not on file  . Marital Status: Not on file   Past Surgical History:  Procedure  Laterality Date  . ABDOMINAL HYSTERECTOMY  May 2004  . Basel Cell Carcinoma  06/2005, 07/2005   nasal tip and reconstruction  . BREAST CYST ASPIRATION    . CARPAL TUNNEL RELEASE  07/1993   both hands  . COLONOSCOPY    . HEMORROIDECTOMY  July 2003  . KNEE ARTHROPLASTY  09/2001, 05/2003   left knee, chondromalasia  . KNEE ARTHROSCOPY  jan 2005   Right knee, tisse release, chrondromalacia  . REPLACEMENT TOTAL KNEE  Nov 2005   Left   . THYROID LOBECTOMY  01/2005   malignant area removed from right lobe  . THYROID SURGERY     thyroid lobe removal  . TONSILLECTOMY  09/1972  . TUBAL LIGATION  Sept 1999  . Ulnar nerve entrapment  Feb 1995   left elbow   Past Medical History:  Diagnosis Date  . Allergy   . Anxiety   . Cancer ([place])    cancerous nodules on thyroid  . Cancer ([place])    basal cell Cancer-nose  . Chronic pain syndrome   . COPD (chronic obstructive pulmonary disease) ([place])   . Degeneration of lumbar or lumbosacral intervertebral disc   . Depression   . Facet syndrome, lumbar   . GERD (gastroesophageal reflux disease)   . Hyperlipidemia   . Intervertebral lumbar disc disorder with myelopathy, lumbar region   . Lumbosacral spondylosis without myelopathy   . Migraine without aura, with intractable migraine, so stated, without mention of status migrainosus   . Primary localized osteoarthrosis, lower leg   . Thyroid disease    hypothyroid  . Unspecified musculoskeletal disorders and symptoms referable to neck    cervical/trapezius   There were no vitals taken for this visit.  Opioid Risk Score:   Fall Risk Score:  `1  Depression screen PHQ 2/9  Depression screen [facility] 2/9 06/07/2018 01/28/2018 01/28/2018 10/11/2017 10/25/2016 08/25/2016 07/14/2016  Decreased Interest 3 3 3 3 3 3 3   Down, Depressed, Hopeless 3 3 3 3 3 3 3   PHQ - 2 Score 6 6 6 6 6 6 6   Altered sleeping - 3 3 3  - - -  Tired, decreased energy - 3 3 3  - - -  Change in appetite - 3 3 2  - - -  Feeling bad or failure  about yourself  - 3 3 3  - - -  Trouble concentrating - 1 1 3  - - -  Moving slowly or fidgety/restless - 0 0 0 - - -  Suicidal thoughts - 2 2 0 - - -  PHQ-9 Score - 21 21 20  - - -  Difficult doing work/chores - Extremely dIfficult Extremely dIfficult Extremely dIfficult - - -  Some encounter information is confidential and restricted. Go to Review Flowsheets activity to see all data.  Some recent data might be hidden     Review of Systems  Constitutional: Positive for appetite change, diaphoresis and unexpected weight change.  HENT: Negative.   Eyes: Negative.   Respiratory: Negative.   Cardiovascular: Positive for leg swelling.  Gastrointestinal: Positive for nausea and vomiting.  Endocrine: Negative.   Genitourinary: Negative.   Musculoskeletal: Positive for arthralgias, back pain, gait problem and myalgias.  Skin: Negative.   Allergic/Immunologic: Negative.   Neurological: Positive for weakness and numbness.  Hematological: Negative.   Psychiatric/Behavioral: Positive for confusion and dysphoric mood. The patient is nervous/anxious.   All other systems reviewed and are negative.      Objective:   Physical Exam General: No acute distress HEENT: EOMI, oral membranes moist Cards: reg rate  Chest: normal effort Abdomen: Soft, NT, ND Skin: dry, intact Extremities: no edema Musculoskeletal:  She is antalgic with weightbearing bilaterally.  She has pain in both knees with palpation and range of motion.   Neurological:normal exam Psychiatry:She is always pleasant.  Sometimes appears sad when talking about her pain problems.         1.Chronic Low Back Pain: -Continue MS Contin 90 mg every 12 hours.  This is divided in a 60 mg and 30 mg dose.  Our goal is to be decreasing this further over the next year.  Patient understands this.  We will hold on any decrease today given the situation as noted above. -  ms IR 15mg  q6 prn #120-this was refilled  today .We will continue the controlled substance monitoring program, this consists of regular clinic visits, examinations, routine drug screening, pill counts as well as use of [place] Controlled Substance Reporting System. [place] was [place] today.   Medication was refilled and a second prescription was sent to the patient's pharmacy for next month.   2. Degenerative Disk Disease:Continue exercise, and heat therapy.  We discussed these at length today 3. Osteoarthritis of Bilateral Knees: Continue exercise and HEP -Continue compression stockings and edema controlwhich have been discussed. 4. Migraine Headaches: On Maxalt.         -We will increase Aimovig to 140 mg monthly.  New prescription provided.   5. Smoking Cessation:shall continue 6. Constipation: Continue Linzess 7. Muscle Spasm: Continue Tizanidine 8. Chronic depression: celexa.   -service dog    -Continue counseling  50 minutes of patient time was spent today in the office.  I will have her follow-up with nurse practitioner in 2 months

## 2019-03-12 NOTE — Patient Instructions (Addendum)
COVID-19 Vaccine Information can be found at: [email or web address] For questions related to vaccine distribution or appointments, please email [email or web address] or call [phone].     CONTINUE TO MAINTAIN YOUR ACTIVITY AND COUNSELING AS MUCH AS POSSIBLE.

## 2019-03-17 ENCOUNTER — Ambulatory Visit (INDEPENDENT_AMBULATORY_CARE_PROVIDER_SITE_OTHER): Payer: Medicare Other | Admitting: Licensed Clinical Social Worker

## 2019-03-17 DIAGNOSIS — F411 Generalized anxiety disorder: Secondary | ICD-10-CM

## 2019-03-17 DIAGNOSIS — G894 Chronic pain syndrome: Secondary | ICD-10-CM | POA: Diagnosis not present

## 2019-03-17 DIAGNOSIS — F331 Major depressive disorder, recurrent, moderate: Secondary | ICD-10-CM | POA: Diagnosis not present

## 2019-03-17 DIAGNOSIS — M5136 Other intervertebral disc degeneration, lumbar region: Secondary | ICD-10-CM | POA: Diagnosis not present

## 2019-03-17 NOTE — Progress Notes (Signed)
Virtual Visit via Telephone Note  I connected with [name] on 03/17/19 at  8:00 AM EST by telephone and verified that I am speaking with the correct person using two identifiers.   I discussed the limitations, risks, security and privacy concerns of performing an evaluation and management service by telephone and the availability of in person appointments. I also discussed with the patient that there may be a patient responsible charge related to this service. The patient expressed understanding and agreed to proceed.   I discussed the assessment and treatment plan with the patient. The patient was provided an opportunity to ask questions and all were answered. The patient agreed with the plan and demonstrated an understanding of the instructions.   The patient was advised to call back or seek an in-person evaluation if the symptoms worsen or if the condition fails to improve as anticipated.  I provided 55 minutes of non-face-to-face time during this encounter.  THERAPIST PROGRESS NOTE  Session Time: 8:01 AM to 8:56 AM  Participation Level: Active  Behavioral Response: CasualAlertDysphoric and Euthymic-in session, continuing daughter mood more depressed  Type of Therapy: Individual Therapy  Treatment Goals addressed:  Decrease in depression, anxiety and panic  Interventions: Solution Focused, Strength-based, Supportive, Reframing and Other: coing  Summary: [name] is a 59 y.o. female who presents with body breaks down and gets really frustrated. A little more mobility would make a big difference for patient. Patient is expected to get some money for her mom's estate. Wonders whether to have liposuction or denture. Husband adverse to fat people but knows that patient can't help it. Has been thinking of it one or the other. Thinking about the teeth and wants to eat things she hasn't been able to enjoy. Have given up steak, apple, corn on cob but then thinks if eats  like that gain weight. Then think lose some weight and be able to move better and exercise. Also thinking about fixing up the house because knows won't keep the house. Has 20 projects need to do before sell the place.  Therapist guided patient in doing what would make her happy, reviewed some of the things she talked about before in sessions that she thought would help her such as some type of small swimming pool.  Discussed Next door app. And connects her with neighborhoods around her. Patient takes the lead with it and guides people around who are new. For five years been lead hasn't wanted to spend as much time on that tired, spend time on [employer]. Can get on play games.  Mood brightened when patient began to talk about these activities.  Brought up memories of her grandfather who introduced her to it.  It has been a hard week this week her daughter's birthday. I will call but she won't answer.  Therapist guided patient and thinking at situation in terms of her process things happening over time for relationships to mend.             Suicidal/Homicidal: No  Therapist Response: Therapist offered supportive, open questions, active listening, facilitated expression of thoughts and feelings.  Reviewed difficulties patient is having that have increased recently with relationship with daughter since it is her birthday.  Utilize reframing to provide hopeful interventions that therapist experience is family's mental relationships even though does not happen in timeframe that we always may want.  Processed patient's feelings about how to use money that she will be inheriting looking at pros and  cons of different choices with idea of encouraging patient in a choice that would make her feel better, improve quality of life.  Encourage patient with healthy coping in general, for example, encourage patient to utilize resources to connect to others such as next-door app, engaging in activities she enjoys  that help with mood such as [employer]land [employer]  Provided strength based and supportive intervention Plan: Return again in 2 weeks.2.  Therapist utilized supportive interventions, support patient in effective coping, utilize emotional regulation interventions, stress management  Diagnosis: Axis I:  major depressive disorder, recurrent, moderate, degenerative disc disease, chronic pain syndrome, generalized anxiety disorder    Axis II: No diagnosis    [name], LCSW 03/17/2019

## 2019-03-24 ENCOUNTER — Other Ambulatory Visit: Payer: Self-pay | Admitting: Physical Medicine & Rehabilitation

## 2019-03-24 ENCOUNTER — Other Ambulatory Visit: Payer: Self-pay | Admitting: Registered Nurse

## 2019-03-31 ENCOUNTER — Ambulatory Visit (INDEPENDENT_AMBULATORY_CARE_PROVIDER_SITE_OTHER): Payer: Medicare Other | Admitting: Licensed Clinical Social Worker

## 2019-03-31 ENCOUNTER — Other Ambulatory Visit: Payer: Self-pay

## 2019-03-31 DIAGNOSIS — F331 Major depressive disorder, recurrent, moderate: Secondary | ICD-10-CM

## 2019-03-31 DIAGNOSIS — F411 Generalized anxiety disorder: Secondary | ICD-10-CM | POA: Diagnosis not present

## 2019-03-31 DIAGNOSIS — G894 Chronic pain syndrome: Secondary | ICD-10-CM | POA: Diagnosis not present

## 2019-03-31 DIAGNOSIS — M5136 Other intervertebral disc degeneration, lumbar region: Secondary | ICD-10-CM

## 2019-03-31 NOTE — Progress Notes (Signed)
Virtual Visit via Telephone Note  I connected with [name] on 03/31/19 at  8:00 AM EST by telephone and verified that I am speaking with the correct person using two identifiers.   I discussed the limitations, risks, security and privacy concerns of performing an evaluation and management service by telephone and the availability of in person appointments. I also discussed with the patient that there may be a patient responsible charge related to this service. The patient expressed understanding and agreed to proceed.   I discussed the assessment and treatment plan with the patient. The patient was provided an opportunity to ask questions and all were answered. The patient agreed with the plan and demonstrated an understanding of the instructions.   The patient was advised to call back or seek an in-person evaluation if the symptoms worsen or if the condition fails to improve as anticipated.  I provided 55 minutes of non-face-to-face time during this encounter.  THERAPIST PROGRESS NOTE  Session Time: 8:00 AM to 8:55 AM  Participation Level: Active  Behavioral Response: CasualAlertDysphoric  Type of Therapy: Individual Therapy  Treatment Goals addressed:  Decrease in depression, anxiety and panic  Interventions: Solution Focused, Strength-based, Supportive, Reframing and Other: education on trauma therapy  Summary: [name] is a 59 y.o. female who presents with getting over a headache. Sleep off and on. Sometimes night and day switched around. Doesn't bother her since she is not doing much. Went to doctors. Asked about the shot that is not doing anything for her headaches. Hard because it is a shot that she has to give herself. He increased the dosage. Discussed getting herself on regular sleep schedule, previous therapist tried to work on that with her. Having her get up and see sunrise and sunset. (therapist discussed getting sunlight as a basic step she could take  to help her wellbeing. Neuroscientist recommends for our well-being. Patient shares that it is difficult since only sleeps a half hour to forty-five minutes at a time, doesn't get sustained sleep. Tries to get sleep when she can. Talked about husband who was a marine having PTSD and patient's own PTSD. She shares that there are things remember that rather not talk about. Talking about it hurts. It was so bad. She was only 59 years old. Started crying in session and doesn't like when she cries. Only told husband.  Therapist pointed out that crying is cathartic release can help her feel better even though patient relates does not make her feel better.  Therapist offered for patient to talk about  past trauma but patient shares maybe another time. Therapist reviewed positive event of what she wants to spend money from inheritance from mom. She is thinking about a set of dentures, or liposuction. Thinking which would be more beneficial for her. Would like to have teeth so easier to eat. If lost weight it would also make her feel better to take off weight. This would made her feel better about herself.  Therapist pointed out that it is positive to think about and talk about something that is going to happen and improve her life.     Suicidal/Homicidal: No  Therapist Response: Therapist reviewed symptoms, facilitated expression of thoughts and feelings, reviewed simple steps patient could do for her wellbeing such as getting sunlight in the morning, simple strategies as her mobility is limited.Provided education on trauma therapy, exploring whether patient would be interested in going in that direction in therapy.  Explained cognitive behavioral therapy helps  her brain to process the traumatic event into the memory filing and when appropriate filing cabinet of her mind so that it becomes a past event rather than constantly reliving the trauma is happening right now.  Therapy will help her to think about her imagine  the traumatic event in a safe environment and she can gradually expose herself to those situations that remind her of the event.  Inevitably thinking and talking with the trauma may be upsetting at the time but will reduce the overall distress and resolve the problem.  Reviewed with patient that may be appropriate at some point but patient not yet ready to talk about trauma.  Reviewed some of patient's difficulties and provided supportive intervention such as inability to sleep for long periods of time, difficulties with migraines.  Therapist used cognitive reframing of patient looking at being able to do some positive things for herself that will help her to feel better with inheritance money.  Therapist provided strength based intervention.   Plan: Return again in 2 weeks.2.Therapist utilized supportive interventions, support patient in effective coping, utilize emotional regulation interventions, stress management  Diagnosis: Axis I:   major depressive disorder, recurrent, moderate, degenerative disc disease, chronic pain syndrome, generalized anxiety disorder    Axis II: No diagnosis    [name], LCSW 03/31/2019

## 2019-04-10 ENCOUNTER — Other Ambulatory Visit: Payer: Self-pay | Admitting: Registered Nurse

## 2019-04-14 ENCOUNTER — Ambulatory Visit (INDEPENDENT_AMBULATORY_CARE_PROVIDER_SITE_OTHER): Payer: Medicare Other | Admitting: Licensed Clinical Social Worker

## 2019-04-14 DIAGNOSIS — G894 Chronic pain syndrome: Secondary | ICD-10-CM | POA: Diagnosis not present

## 2019-04-14 DIAGNOSIS — F411 Generalized anxiety disorder: Secondary | ICD-10-CM

## 2019-04-14 DIAGNOSIS — M5136 Other intervertebral disc degeneration, lumbar region: Secondary | ICD-10-CM

## 2019-04-14 DIAGNOSIS — F331 Major depressive disorder, recurrent, moderate: Secondary | ICD-10-CM | POA: Diagnosis not present

## 2019-04-14 NOTE — Progress Notes (Signed)
Virtual Visit via Telephone Note  I connected with [name] on 04/14/19 at  8:00 AM EST by telephone and verified that I am speaking with the correct person using two identifiers.   I discussed the limitations, risks, security and privacy concerns of performing an evaluation and management service by telephone and the availability of in person appointments. I also discussed with the patient that there may be a patient responsible charge related to this service. The patient expressed understanding and agreed to proceed.   I discussed the assessment and treatment plan with the patient. The patient was provided an opportunity to ask questions and all were answered. The patient agreed with the plan and demonstrated an understanding of the instructions.   The patient was advised to call back or seek an in-person evaluation if the symptoms worsen or if the condition fails to improve as anticipated.  I provided 55 minutes of non-face-to-face time during this encounter.  THERAPIST PROGRESS NOTE  Session Time: 8:00 AM to 8:55 AM  Participation Level: Active  Behavioral Response: CasualAlertDysphoric-euthymic in session  Type of Therapy: Individual Therapy  Treatment Goals addressed:   Decrease in depression, anxiety and panic   Interventions: Solution Focused, Strength-based, Supportive and Other: coping  Summary: [name] is a 59 y.o. female who presents with difficulty of sleeping "like all my nerves are on fire" saying wake up. Can't have any weight on her. Have to wear loose clothing. Describes feeling more pain than usual hip and back bothering her. It never goes away. Can't remember not having any pain. It has been so long now when didn't have any. "It sucks." Distraction she uses with the headaches and the migraines. Enjoys learning from the television. Knows everything about [place]. Discussed beginning of pain issues. Started with right knee clean up messed up  cartilage. Pain was supposed to go away. Did physical therapy. Burned all the time like "somebody put a bucket of lighter fluid on it," burned and burned. Doctor said not supposed to have pain and said nothing could do for her. Didn't think that would happen.Went to see orthopedic doctor. Started left knee same problem. Orthoscopic surgery on left like right knee. Still problems so went in on both knees lateral tissue relief. That didn't help. Partial replacement on left knee made it worse. Same thing said nothing more can do. Went to see doctor parents went to, Dr. Margrett Rud. did replacement on left knee. 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  Reviewed session and patient relates helpful to talk to therapist.         Suicidal/Homicidal: No  Therapist Response: Therapist reviewed symptoms, facilitated expression of thoughts and feelings, provided active listening, open questions supportive encouragement.. Assessed actively working on patient's treatment goals through interventions that help patient to release feelings related to current and past stressors.  Discussed distraction as a coping mechanism to deal with pain.  Identified distraction strategies patient uses.  Discussed stressors related to difficulties with pain issues, difficulty in management of daily tasks, provided supportive intervention as a strategy to help work on treatment goal of decrease in depression and anxiety.  Discussed patient's resources and resiliency related to management of stressors.  Assessed patient's use of humor as ego strength very helpful for coping.  In addition assess her relationship with husband who is a good support as helpful in coping with difficult medical issues that  impact mental health issues.  Plan: Return again in 2 weeks.2.Therapist utilized supportive interventions, support patient in effective coping, utilize emotional regulation interventions,  stress management  Diagnosis: Axis I:  major depressive disorder, recurrent, moderate, degenerative disc disease, chronic pain syndrome, generalized anxiety disorder    Axis II: No diagnosis    [name], LCSW 04/14/2019

## 2019-04-22 ENCOUNTER — Other Ambulatory Visit: Payer: Self-pay | Admitting: Registered Nurse

## 2019-04-28 ENCOUNTER — Ambulatory Visit (INDEPENDENT_AMBULATORY_CARE_PROVIDER_SITE_OTHER): Payer: Medicare Other | Admitting: Licensed Clinical Social Worker

## 2019-04-28 DIAGNOSIS — G894 Chronic pain syndrome: Secondary | ICD-10-CM | POA: Diagnosis not present

## 2019-04-28 DIAGNOSIS — M5136 Other intervertebral disc degeneration, lumbar region: Secondary | ICD-10-CM | POA: Diagnosis not present

## 2019-04-28 DIAGNOSIS — F411 Generalized anxiety disorder: Secondary | ICD-10-CM

## 2019-04-28 DIAGNOSIS — F331 Major depressive disorder, recurrent, moderate: Secondary | ICD-10-CM | POA: Diagnosis not present

## 2019-04-28 NOTE — Progress Notes (Signed)
Virtual Visit via Telephone Note  I connected with [name] on 04/28/19 at  8:00 AM EDT by telephone and verified that I am speaking with the correct person using two identifiers.   I discussed the limitations, risks, security and privacy concerns of performing an evaluation and management service by telephone and the availability of in person appointments. I also discussed with the patient that there may be a patient responsible charge related to this service. The patient expressed understanding and agreed to proceed.   Follow Up Instructions:    I discussed the assessment and treatment plan with the patient. The patient was provided an opportunity to ask questions and all were answered. The patient agreed with the plan and demonstrated an understanding of the instructions.   The patient was advised to call back or seek an in-person evaluation if the symptoms worsen or if the condition fails to improve as anticipated.  I provided 54 minutes of non-face-to-face time during this encounter.  THERAPIST PROGRESS NOTE  Session Time: 8:00 AM to 8:54 AM  Participation Level: Active  Behavioral Response: CasualAlertDysphoric and Euthymic  Type of Therapy: Individual Therapy  Treatment Goals addressed:  Decrease in depression, anxiety and panic    Interventions: Solution Focused, Strength-based, Supportive, Reframing and Other: coping  Summary: [name] is a 59 y.o. female who presents with sharing about her life history. Sharing about marriages and raising kids. Talked about injury at work, cracked a vertebrae. Lost job at [employer]. Dad Careers information officer. 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 Loved the people. 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 Last year she worked there is when she had surgery for carpel tunnel, nerve problems with hands and elbows, then knee problems, then back problems. After a few  years with her back didn't bother but once in awhile bother her and not sure what triggers the back. Got trigger point injections. Worked well sometimes, then next time it might now work at all. Last worked February 24 2003. Next day having surgery. Never able to come back when started having pain in knees and back. "all fell apart" and different things and different times "that's how I got to where I am." Shared she just had her birthday. Got headphones, haven't had birthday present in years. Shared she enjoyed it, talked about hearing from family members, prayed that she would hear from DD, her daughter, but did not hear from her.          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  Also helpful for patient to process feelings related to recent birthday also help with enhancement of mood.  Therapist utilizes reframing to help patient assess lack of contact from daughter, sharing helpfulness of prayer as well as remaining hopeful the daughter will contact her.  Therapist provided strength based and supportive intervention  Plan: Return again in 2 weeks.2.Therapist utilized supportive interventions, support patient in effective coping, utilize emotional regulation interventions, stress management  Diagnosis: Axis I:   major depressive disorder, recurrent, moderate, degenerative disc disease, chronic pain syndrome,  generalized anxiety disorder    Axis II: No diagnosis    [name], LCSW 04/28/2019

## 2019-05-08 ENCOUNTER — Other Ambulatory Visit: Payer: Self-pay

## 2019-05-08 ENCOUNTER — Encounter: Payer: Medicare Other | Attending: Physical Medicine and Rehabilitation | Admitting: Registered Nurse

## 2019-05-08 ENCOUNTER — Encounter: Payer: Self-pay | Admitting: Registered Nurse

## 2019-05-08 VITALS — BP 134/79 | HR 90 | Temp 97.7°F | Ht 68.5 in | Wt 280.0 lb

## 2019-05-08 DIAGNOSIS — M5136 Other intervertebral disc degeneration, lumbar region: Secondary | ICD-10-CM

## 2019-05-08 DIAGNOSIS — Z79891 Long term (current) use of opiate analgesic: Secondary | ICD-10-CM | POA: Insufficient documentation

## 2019-05-08 DIAGNOSIS — M47816 Spondylosis without myelopathy or radiculopathy, lumbar region: Secondary | ICD-10-CM | POA: Diagnosis not present

## 2019-05-08 DIAGNOSIS — M1711 Unilateral primary osteoarthritis, right knee: Secondary | ICD-10-CM | POA: Diagnosis not present

## 2019-05-08 DIAGNOSIS — G894 Chronic pain syndrome: Secondary | ICD-10-CM | POA: Diagnosis not present

## 2019-05-08 DIAGNOSIS — M1712 Unilateral primary osteoarthritis, left knee: Secondary | ICD-10-CM | POA: Diagnosis not present

## 2019-05-08 DIAGNOSIS — Z760 Encounter for issue of repeat prescription: Secondary | ICD-10-CM | POA: Insufficient documentation

## 2019-05-08 DIAGNOSIS — M7062 Trochanteric bursitis, left hip: Secondary | ICD-10-CM

## 2019-05-08 DIAGNOSIS — Z79899 Other long term (current) drug therapy: Secondary | ICD-10-CM | POA: Diagnosis not present

## 2019-05-08 DIAGNOSIS — Z5181 Encounter for therapeutic drug level monitoring: Secondary | ICD-10-CM | POA: Diagnosis not present

## 2019-05-08 DIAGNOSIS — M7061 Trochanteric bursitis, right hip: Secondary | ICD-10-CM

## 2019-05-08 DIAGNOSIS — G8929 Other chronic pain: Secondary | ICD-10-CM

## 2019-05-08 DIAGNOSIS — M546 Pain in thoracic spine: Secondary | ICD-10-CM

## 2019-05-08 MED ORDER — MORPHINE SULFATE ER 60 MG PO TBCR: 60.0000 mg | EXTENDED_RELEASE_TABLET | Freq: Two times a day (BID) | ORAL | 0 refills | Status: DC

## 2019-05-08 MED ORDER — MORPHINE SULFATE 15 MG PO TABS: 15.0000 mg | ORAL_TABLET | Freq: Four times a day (QID) | ORAL | 0 refills | Status: DC | PRN

## 2019-05-08 MED ORDER — MORPHINE SULFATE ER 30 MG PO TBCR: 30.0000 mg | EXTENDED_RELEASE_TABLET | Freq: Two times a day (BID) | ORAL | 0 refills | Status: DC

## 2019-05-08 MED ORDER — TIZANIDINE HCL 2 MG PO TABS: 2.0000 mg | ORAL_TABLET | Freq: Three times a day (TID) | ORAL | 5 refills | Status: DC | PRN

## 2019-05-08 NOTE — Progress Notes (Signed)
Subjective:    Patient ID: [name], female    DOB: January 28, 1961, 60 y.o.   MRN: [id number]  HPI: [name] is a 59 y.o. female who returns for follow up appointment for chronic pain and medication refill. She states her pain is located in her mid- back mainly left side, lower back, bilateral hips and bilateral knees. Ms. Grab also reports she has a headache this morning, onset this morning. She rates her pain 8. Her current exercise regime is walking and performing chair exercises.  Ms. Desaulniers Morphine equivalent is 232.50 MME.  UDS ordered today.   Pain Inventory Average Pain 8 Pain Right Now 8 My pain is constant, sharp, burning, dull, stabbing, tingling and aching  In the last 24 hours, has pain interfered with the following? General activity 10 Relation with others  Enjoyment of life 10 What TIME of day is your pain at its worst? n/a Sleep (in general) Poor  Pain is worse with: walking, bending, standing, unsure and some activites Pain improves with: rest, heat/ice, therapy/exercise, pacing activities, medication, TENS and injections Relief from Meds: 6  Mobility use a cane use a walker how many minutes can you walk? 30 ability to climb steps?  yes do you drive?  yes  Function disabled: date disabled 02/24/2003 I need assistance with the following:  bathing, meal prep, household duties and shopping  Neuro/Psych weakness numbness tremor tingling trouble walking depression anxiety  Prior Studies bone scan x-rays CT/MRI  Physicians involved in your care Primary care . Psychiatrist .   Family History  Problem Relation Age of Onset  . Depression Mother   . Hypertension Mother   . Hypertension Father   . Heart failure Father   . Diabetes Father   . Heart attack Father   . Colon cancer Neg Hx   . Esophageal cancer Neg Hx   . Stomach cancer Neg Hx   . Rectal cancer Neg Hx    Social History   Socioeconomic History  .  Marital status: Married    Spouse name: [name]  . Number of children: 1  . Years of education: some college  . Highest education level: Not on file  Occupational History  . Occupation: On disability    Employer: UNEMPLOYED  Tobacco Use  . Smoking status: Former Smoker    Packs/day: 0.50    Types: Cigarettes    Quit date: 02/06/2016    Years since quitting: 3.2  . Smokeless tobacco: Never Used  . Tobacco comment: going to try e-sticks to quit  Substance and Sexual Activity  . Alcohol use: No    Alcohol/week: 0.0 standard drinks  . Drug use: No  . Sexual activity: Not Currently    Partners: Male  Other Topics Concern  . Not on file  Social History Narrative   8-10 cups of soda a day.  On disability. No regular exercise.     Social Determinants of Health   Financial Resource Strain:   . Difficulty of Paying Living Expenses:   Food Insecurity:   . Worried About Charity fundraiser in the Last Year:   . Arboriculturist in the Last Year:   Transportation Needs:   . Film/video editor (Medical):   Marland Kitchen Lack of Transportation (Non-Medical):   Physical Activity:   . Days of Exercise per Week:   . Minutes of Exercise per Session:   Stress:   . Feeling of Stress :   Social Connections:   .  Frequency of Communication with Friends and Family:   . Frequency of Social Gatherings with Friends and Family:   . Attends Religious Services:   . Active Member of Clubs or Organizations:   . Attends Archivist Meetings:   Marland Kitchen Marital Status:    Past Surgical History:  Procedure Laterality Date  . ABDOMINAL HYSTERECTOMY  May 2004  . Basel Cell Carcinoma  06/2005, 07/2005   nasal tip and reconstruction  . BREAST CYST ASPIRATION    . CARPAL TUNNEL RELEASE  07/1993   both hands  . COLONOSCOPY    . HEMORROIDECTOMY  July 2003  . KNEE ARTHROPLASTY  09/2001, 05/2003   left knee, chondromalasia  . KNEE ARTHROSCOPY  jan 2005   Right knee, tisse release, chrondromalacia  .  REPLACEMENT TOTAL KNEE  Nov 2005   Left   . THYROID LOBECTOMY  01/2005   malignant area removed from right lobe  . THYROID SURGERY     thyroid lobe removal  . TONSILLECTOMY  09/1972  . TUBAL LIGATION  Sept 1999  . Ulnar nerve entrapment  Feb 1995   left elbow   Past Medical History:  Diagnosis Date  . Allergy   . Anxiety   . Cancer ([place])    cancerous nodules on thyroid  . Cancer ([place])    basal cell Cancer-nose  . Chronic pain syndrome   . COPD (chronic obstructive pulmonary disease) ([place])   . Degeneration of lumbar or lumbosacral intervertebral disc   . Depression   . Facet syndrome, lumbar   . GERD (gastroesophageal reflux disease)   . Hyperlipidemia   . Intervertebral lumbar disc disorder with myelopathy, lumbar region   . Lumbosacral spondylosis without myelopathy   . Migraine without aura, with intractable migraine, so stated, without mention of status migrainosus   . Primary localized osteoarthrosis, lower leg   . Thyroid disease    hypothyroid  . Unspecified musculoskeletal disorders and symptoms referable to neck    cervical/trapezius   BP 134/79   Pulse 90   Temp 97.7 F (36.5 C)   Ht 5' 8.5" (1.74 m)   Wt 280 lb (127 kg)   SpO2 95%   BMI 41.95 kg/m   Opioid Risk Score:   Fall Risk Score:  `1  Depression screen PHQ 2/9  Depression screen [facility] 2/9 06/07/2018 01/28/2018 01/28/2018 10/11/2017 10/25/2016 08/25/2016 07/14/2016  Decreased Interest 3 3 3 3 3 3 3   Down, Depressed, Hopeless 3 3 3 3 3 3 3   PHQ - 2 Score 6 6 6 6 6 6 6   Altered sleeping - 3 3 3  - - -  Tired, decreased energy - 3 3 3  - - -  Change in appetite - 3 3 2  - - -  Feeling bad or failure about yourself  - 3 3 3  - - -  Trouble concentrating - 1 1 3  - - -  Moving slowly or fidgety/restless - 0 0 0 - - -  Suicidal thoughts - 2 2 0 - - -  PHQ-9 Score - 21 21 20  - - -  Difficult doing work/chores - Extremely dIfficult Extremely dIfficult Extremely dIfficult - - -  Some encounter information is  confidential and restricted. Go to Review Flowsheets activity to see all data.  Some recent data might be hidden    Review of Systems  Constitutional: Positive for unexpected weight change.  Cardiovascular: Positive for leg swelling.  Gastrointestinal: Positive for nausea and vomiting.  All other systems reviewed  and are negative.      Objective:   Physical Exam Vitals and nursing note reviewed.  Constitutional:      Appearance: Normal appearance.  Cardiovascular:     Rate and Rhythm: Normal rate and regular rhythm.     Pulses: Normal pulses.     Heart sounds: Normal heart sounds.  Pulmonary:     Effort: Pulmonary effort is normal.     Breath sounds: Normal breath sounds.  Musculoskeletal:     Cervical back: Normal range of motion and neck supple.     Comments: Normal Muscle Bulk and Muscle Testing Reveals:  Upper Extremities: Full ROM and Muscle Strength 5/5 Thoracic Paraspinal Tenderness: T-3-T-4 Mainly Left Side Lumbar Paraspinal Tenderness: L-3-L-5 Lower Extremities: Decreased ROM and Muscle Strength 5/5 Bilateral Lower Extremities Flexion Produces Pain into her Bilateral Patella's Arises from chair slowly using cane for support  Antalgic Gait   Skin:    General: Skin is warm and dry.  Neurological:     Mental Status: She is alert and oriented to person, place, and time.  Psychiatric:        Mood and Affect: Mood normal.        Behavior: Behavior normal.           Assessment & Plan:  1.Chronic Bilateral Thoracic Back Pain/ Low Back Pain/ Lumbar Facet Arthropathy:RefilledMSIR15mg  one tablet every 6 hoursas needed #105andContinue with Slow weaning ofMorphine. Refilled: MS Contin 60 mg and 30 mg Q 12 hours to equal 90 mg #60.Continue Gabapentin and Pamelor.05/08/2019 We will continue the opioid monitoring program, this consists of regular clinic visits, examinations, urine drug screen, pill counts as well as use of [place] Controlled Substance  Reporting System. 2. Degenerative Disk Disease:Encouraged Continue HEP as Tolertatedand heat therapy. Continue Current Medication Regime.05/08/2019. 3.Bilateral Greater Trochanteric Bursitis: Continuecurrent treatment regimenwith Heat and Ice Therapy.05/08/2019. 4. Osteoarthritis of Bilateral Knees: Continue current treatment modality with homeexerciseprogramand heat therapy.05/08/2019 5. Migraine Headaches:Continue withMigraine Journal.Continue: Aimovig,LamictalandMaxalt. Continue to Monitor.05/08/2019 6. Smoking Cessation: She has quit smokingsince 08/25/2016. Continue to Monitor.05/08/2019 7.Constipation: ContinueCurrent medication regime withLinzess.05/08/2019 8. Muscle Spasm: Continuecurrent medication regime withTizanidine.05/08/2019 9. DepressionAnxiety/ Panic Attacks: Continuecurrent medication regimen and treatment modality.Celexa.Lamictaland Counseling with [name] andDr.Akhtart Psychiatrist.05/08/2019 10. RSD: Continue with current medication Regimewith Gabapentin.05/08/2019  83minutes of face to face patient care time was spent during this visit. All questions were encouraged and answered.  F/U in 1 month

## 2019-05-12 ENCOUNTER — Other Ambulatory Visit: Payer: Self-pay

## 2019-05-12 ENCOUNTER — Ambulatory Visit (INDEPENDENT_AMBULATORY_CARE_PROVIDER_SITE_OTHER): Payer: Medicare Other | Admitting: Licensed Clinical Social Worker

## 2019-05-12 DIAGNOSIS — F411 Generalized anxiety disorder: Secondary | ICD-10-CM | POA: Diagnosis not present

## 2019-05-12 DIAGNOSIS — F331 Major depressive disorder, recurrent, moderate: Secondary | ICD-10-CM

## 2019-05-12 DIAGNOSIS — G894 Chronic pain syndrome: Secondary | ICD-10-CM | POA: Diagnosis not present

## 2019-05-12 DIAGNOSIS — M5136 Other intervertebral disc degeneration, lumbar region: Secondary | ICD-10-CM | POA: Diagnosis not present

## 2019-05-12 LAB — TOXASSURE SELECT,+ANTIDEPR,UR

## 2019-05-12 NOTE — Progress Notes (Signed)
Virtual Visit via Telephone Note  I connected with [name] on 05/12/19 at  8:00 AM EDT by telephone and verified that I am speaking with the correct person using two identifiers.   I discussed the limitations, risks, security and privacy concerns of performing an evaluation and management service by telephone and the availability of in person appointments. I also discussed with the patient that there may be a patient responsible charge related to this service. The patient expressed understanding and agreed to proceed.   I discussed the assessment and treatment plan with the patient. The patient was provided an opportunity to ask questions and all were answered. The patient agreed with the plan and demonstrated an understanding of the instructions.   The patient was advised to call back or seek an in-person evaluation if the symptoms worsen or if the condition fails to improve as anticipated.  I provided 54 minutes of non-face-to-face time during this encounter.   THERAPIST PROGRESS NOTE  Session Time: 8:00 AM to 8:54 AM  Participation Level: Active  Behavioral Response: CasualAlertDepressed  Type of Therapy: Individual Therapy  Treatment Goals addressed: Decrease in depression, anxiety and panic    Interventions: Solution Focused, Strength-based, Supportive, Reframing and Other: coping  Summary: [name] is a 59 y.o. female who presents with headache all week prevented her from going to lunch with brothers and sisters.  Discussed humor as a way of coping for her, not all siblings share and relates make fun of things can't cry all the time. Hide how feel behind humor. Plan to have lunch with sister for lunch has to give her some documents to settle mom's estate. Another holiday without hearing from daughter. It hurts a lot. People don't remember it the same way who are involved. Especially hurts patient because it is her child, doesn't understand, punished and  doesn't know what it is. Can't make up with them if don't know what you did in the first place. Patient shares that people maybe think sometimes ought to know why mad, but goes back to people not seeing things the same way so don't know always why mad. Part of it was adopting [name] patient has found out but talked to daughter first about it. Talked to her first before married the first two times.  She always ran things by her daughter. She was a little kid when married first two times. When married [name] she was 9-10. Wish somebody tell her something so can make amends. Want to send her money for mom's estate and make her understand grandmom wanted her to have it. Discussed relationship with mom, mom in hospital and didn't bond part of reason maybe for problems in relationship with mom. Headache thinking about all of these things has been down this week. [name] had to visit dad for years and there was a lot of pain and jealousy with that. 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 She went to therapy and they wanted to meet him and he didn't want to go. Her therapist left out of the blue and that was difficult. Dad build her up to tear her down.  Patient was tearful at times sharing these memories        Suicidal/Homicidal: No  Therapist Response: Reviewed symptoms, facilitated expression of thoughts and feelings, use this intervention to help patient process feelings related to lack of continued contact from daughter.  Therapist provided her own perspective about her experience to working with  people that eventually people work through things, find forgiveness and make amends and can take longer than expected.  Validated patient how she was feeling that it is difficult for patient to do that 1 daughter will make contact and related best way to make amends his efforts patient had made to reach out to let daughter know she cares about her and wants a relationship with her.  Discussed people  looking at situation differently, daughter may have different perspective and may have inaccuracies, need to hear other perspectives to better understand situation, give patient a chance to explain things from her perspective to help process of communication to work through things.  Provided strength based and supportive intervention.  Therapist worked actively on goal of depression as patient has had difficult week and assess processing feelings related to estrangement with daughter is helpful to working through symptoms, and will help with decrease in depressive symptoms.  Assessed patient's headache is possibly related to depressive symptoms, so processing feelings helping also with physically related symptoms.  Therapist provided strength based and supportive intervention  Plan: Return again in 2 weeks.2.  Therapist work with patient on processing feelings related to estrangement from daughter and helpful coping strategies, decrease in depression, anxiety and panic  Diagnosis: Axis I:  major depressive disorder, recurrent, moderate, degenerative disc disease, chronic pain syndrome, generalized anxiety disorder    Axis II: No diagnosis    [name], LCSW 05/12/2019

## 2019-05-19 ENCOUNTER — Telehealth: Payer: Self-pay

## 2019-05-19 NOTE — Telephone Encounter (Signed)
UDS RESULTS CONSISTENT WITH MEDIACATIONS ON FILE 

## 2019-05-26 ENCOUNTER — Other Ambulatory Visit: Payer: Self-pay

## 2019-05-26 ENCOUNTER — Encounter: Payer: Self-pay | Admitting: Sports Medicine

## 2019-05-26 ENCOUNTER — Ambulatory Visit (INDEPENDENT_AMBULATORY_CARE_PROVIDER_SITE_OTHER): Payer: Medicare Other

## 2019-05-26 ENCOUNTER — Ambulatory Visit (INDEPENDENT_AMBULATORY_CARE_PROVIDER_SITE_OTHER): Payer: Medicare Other | Admitting: Sports Medicine

## 2019-05-26 DIAGNOSIS — Z471 Aftercare following joint replacement surgery: Secondary | ICD-10-CM | POA: Diagnosis not present

## 2019-05-26 DIAGNOSIS — Z96652 Presence of left artificial knee joint: Secondary | ICD-10-CM

## 2019-05-26 LAB — COMPLETE METABOLIC PANEL WITH GFR
AG Ratio: 2 (calc) (ref 1.0–2.5)
ALT: 25 U/L (ref 6–29)
AST: 34 U/L (ref 10–35)
Albumin: 4.1 g/dL (ref 3.6–5.1)
Alkaline phosphatase (APISO): 107 U/L (ref 37–153)
BUN: 9 mg/dL (ref 7–25)
CO2: 28 mmol/L (ref 20–32)
Calcium: 8.9 mg/dL (ref 8.6–10.4)
Chloride: 102 mmol/L (ref 98–110)
Creat: 0.71 mg/dL (ref 0.50–0.99)
GFR, Est African American: 107 mL/min/{1.73_m2} (ref >=60)
GFR, Est Non African American: 92 mL/min/{1.73_m2} (ref >=60)
Globulin: 2.1 g/dL (calc) (ref 1.9–3.7)
Glucose, Bld: 146 mg/dL — ABNORMAL HIGH (ref 65–99)
Potassium: 4 mmol/L (ref 3.5–5.3)
Sodium: 141 mmol/L (ref 135–146)
Total Bilirubin: 1 mg/dL (ref 0.2–1.2)
Total Protein: 6.2 g/dL (ref 6.1–8.1)

## 2019-05-26 LAB — CBC WITH DIFFERENTIAL/PLATELET
Absolute Monocytes: 681 cells/uL (ref 200–950)
Basophils Absolute: 22 cells/uL (ref 0–200)
Basophils Relative: 0.3 %
Eosinophils Absolute: 178 cells/uL (ref 15–500)
Eosinophils Relative: 2.4 %
HCT: 40.8 % (ref 35.0–45.0)
Hemoglobin: 13.4 g/dL (ref 11.7–15.5)
Lymphs Abs: 2449 cells/uL (ref 850–3900)
MCH: 29.9 pg (ref 27.0–33.0)
MCHC: 32.8 g/dL (ref 32.0–36.0)
MCV: 91.1 fL (ref 80.0–100.0)
MPV: 9.1 fL (ref 7.5–12.5)
Monocytes Relative: 9.2 %
Neutro Abs: 4070 cells/uL (ref 1500–7800)
Neutrophils Relative %: 55 %
Platelets: 258 10*3/uL (ref 140–400)
RBC: 4.48 10*6/uL (ref 3.80–5.10)
RDW: 12.3 % (ref 11.0–15.0)
Total Lymphocyte: 33.1 %
WBC: 7.4 10*3/uL (ref 3.8–10.8)

## 2019-05-26 LAB — SEDIMENTATION RATE: Sed Rate: 6 mm/h (ref 0–30)

## 2019-05-26 NOTE — Assessment & Plan Note (Addendum)
This pleasant 59 year old female is post left knee arthroplasty about 20 years ago, several days ago she developed pain without trauma, medial and lateral and suprapatellar. She had no effusion on ultrasound so no arthrocentesis was performed. Adding x-rays, CBC with differential, CMP, ESR. If all negative we will proceed with three-phase bone scan looking for loosening of the prosthesis.  Labs look normal, I am going to order a three-phase bone scan looking for loosening of the arthroplasty components.

## 2019-05-26 NOTE — Progress Notes (Addendum)
    Procedures performed today:    Procedure: Diagnostic Ultrasound of limited of left knee Device: Samsung [id number]  Findings: No effusion Images permanently stored and available for review in the ultrasound unit.  Impression: No effusion for drainage.  Independent interpretation of notes and tests performed by another provider:   None.  Brief History, Exam, Impression, and Recommendations:    History of arthroplasty of left knee This pleasant 59 year old female is post left knee arthroplasty about 20 years ago, several days ago she developed pain without trauma, medial and lateral and suprapatellar. She had no effusion on ultrasound so no arthrocentesis was performed. Adding x-rays, CBC with differential, CMP, ESR. If all negative we will proceed with three-phase bone scan looking for loosening of the prosthesis.  Labs look normal, I am going to order a three-phase bone scan looking for loosening of the arthroplasty components.    ___________________________________________ Gwen Her. Dianah Field, M.D., ABFM., CAQSM. Primary Care and [place] Instructor of [facility] of Medicine

## 2019-05-27 NOTE — Addendum Note (Signed)
Addended by: Silverio Decamp on: 05/27/2019 08:30 AM   Modules accepted: Orders

## 2019-06-03 ENCOUNTER — Telehealth (INDEPENDENT_AMBULATORY_CARE_PROVIDER_SITE_OTHER): Payer: Medicare Other | Admitting: Licensed Clinical Social Worker

## 2019-06-03 DIAGNOSIS — F331 Major depressive disorder, recurrent, moderate: Secondary | ICD-10-CM | POA: Diagnosis not present

## 2019-06-03 DIAGNOSIS — M5136 Other intervertebral disc degeneration, lumbar region: Secondary | ICD-10-CM

## 2019-06-03 DIAGNOSIS — F411 Generalized anxiety disorder: Secondary | ICD-10-CM

## 2019-06-03 DIAGNOSIS — G894 Chronic pain syndrome: Secondary | ICD-10-CM | POA: Diagnosis not present

## 2019-06-03 NOTE — Progress Notes (Signed)
Virtual Visit via Telephone Note  I connected with [name] on 06/03/19 at  9:00 AM EDT by telephone and verified that I am speaking with the correct person using two identifiers.   I discussed the limitations, risks, security and privacy concerns of performing an evaluation and management service by telephone and the availability of in person appointments. I also discussed with the patient that there may be a patient responsible charge related to this service. The patient expressed understanding and agreed to proceed.   I discussed the assessment and treatment plan with the patient. The patient was provided an opportunity to ask questions and all were answered. The patient agreed with the plan and demonstrated an understanding of the instructions.   The patient was advised to call back or seek an in-person evaluation if the symptoms worsen or if the condition fails to improve as anticipated.  I provided 54 minutes of non-face-to-face time during this encounter.   THERAPIST PROGRESS NOTE  Session Time: 9:00 AM to 9:54 AM  Participation Level: Active  Behavioral Response: CasualAlertAnxious, Dysphoric and Euthymic  Type of Therapy: Individual Therapy  Treatment Goals addressed:  Decrease in depression, anxiety and panic   Interventions: Solution Focused, Strength-based, Supportive and Other: coping  Summary: [name] is a 59 y.o. female who presents with helpfulness of having her dog, [name]. Stress of settling her mom's estate and almost there. Discussed relationship from the past with boyfriend, [name], who impacted how she felt about herself, influenced how she felt things in general. In a relationship with a person where everything was about him. Ended up stocking her, see a truck and be concerned it was him, afraid to go places. Discussed with therapist how relationships have significant impact on how we feel about ourselves, things in general.  Shares being  busier than normal with settling affair with mom's estate, had to go out to give her paperwork. "Neat the way it turned out." Enjoyed going out to eat with family members a few times. Explored trying to do that more often but patient said physically too difficult for her. Thursday a week ago started having pain in leg above left knee, knee that was replaced. Saw sports medicine doctor. Had ultrasound, blood work. He wants patient to have a bone scan. Bad at night almost went to ER. Ice packs making it worse. Had her worried. Thought her knee replacement might be coming apart. Upset that was going to be two weeks before find out something. Cried. Better at this point where might cancel the appointment.            Suicidal/Homicidal: No  Therapist Response: Therapist reviewed symptoms, facilitated expression of thoughts and feelings.  Provided education by explaining significant impact relationships have to tie this in with patient talking about relationship with ex-boyfriend.  Shared interactions with people shape how we see her cells in the world what we come to expect from her environments.  From infancy through adulthood her relationships and her believes about these relationships are constantly exerting fluence and her thoughts, feelings and behaviors even when we are not aware of their impact.  We use and allow past relationships to guide current expectations his wife helpful to take a closer look at interpersonal schemas or models in relationships to change schemas that are having negative impact as they continue to influence her feelings and interactions with others as an adult.  Significant intervention for today's session was processing feelings related to pain issues.  Working through feelings as helpful and actively working on treatment goal of decreasing depression anxiety.  Other helpful coping strategies for mood including getting out more regularly engaging in spending time with family members,  planning and activities such as going out to eat although assess patient is limited in this related to difficulty it causes her physically from the several things.  Therapist provided strength based and supportive intervention.  Plan: Return again in 2 weeks.2.  Therapist work with patient on coping and provide strength based and supportive intervention  Diagnosis: Axis I:   major depressive disorder, recurrent, moderate, degenerative disc disease, chronic pain syndrome, generalized anxiety disorder   Axis II: No diagnosis    [name], LCSW 06/03/2019

## 2019-06-05 ENCOUNTER — Encounter: Payer: Self-pay | Admitting: Registered Nurse

## 2019-06-05 ENCOUNTER — Encounter (HOSPITAL_BASED_OUTPATIENT_CLINIC_OR_DEPARTMENT_OTHER): Payer: Medicare Other | Admitting: Registered Nurse

## 2019-06-05 ENCOUNTER — Other Ambulatory Visit: Payer: Self-pay

## 2019-06-05 VITALS — BP 147/73 | HR 94 | Temp 97.9°F | Ht 68.0 in | Wt 276.0 lb

## 2019-06-05 DIAGNOSIS — M47816 Spondylosis without myelopathy or radiculopathy, lumbar region: Secondary | ICD-10-CM | POA: Diagnosis not present

## 2019-06-05 DIAGNOSIS — G8929 Other chronic pain: Secondary | ICD-10-CM

## 2019-06-05 DIAGNOSIS — M546 Pain in thoracic spine: Secondary | ICD-10-CM

## 2019-06-05 DIAGNOSIS — M7061 Trochanteric bursitis, right hip: Secondary | ICD-10-CM

## 2019-06-05 DIAGNOSIS — Z79891 Long term (current) use of opiate analgesic: Secondary | ICD-10-CM | POA: Diagnosis not present

## 2019-06-05 DIAGNOSIS — M5136 Other intervertebral disc degeneration, lumbar region: Secondary | ICD-10-CM

## 2019-06-05 DIAGNOSIS — M1711 Unilateral primary osteoarthritis, right knee: Secondary | ICD-10-CM

## 2019-06-05 DIAGNOSIS — G894 Chronic pain syndrome: Secondary | ICD-10-CM

## 2019-06-05 DIAGNOSIS — F329 Major depressive disorder, single episode, unspecified: Secondary | ICD-10-CM

## 2019-06-05 DIAGNOSIS — Z79899 Other long term (current) drug therapy: Secondary | ICD-10-CM

## 2019-06-05 DIAGNOSIS — Z5181 Encounter for therapeutic drug level monitoring: Secondary | ICD-10-CM | POA: Diagnosis not present

## 2019-06-05 DIAGNOSIS — M1712 Unilateral primary osteoarthritis, left knee: Secondary | ICD-10-CM

## 2019-06-05 DIAGNOSIS — Z760 Encounter for issue of repeat prescription: Secondary | ICD-10-CM | POA: Diagnosis not present

## 2019-06-05 DIAGNOSIS — M7062 Trochanteric bursitis, left hip: Secondary | ICD-10-CM

## 2019-06-05 MED ORDER — MORPHINE SULFATE 15 MG PO TABS: 15.0000 mg | ORAL_TABLET | Freq: Four times a day (QID) | ORAL | 0 refills | Status: DC | PRN

## 2019-06-05 MED ORDER — MORPHINE SULFATE ER 60 MG PO TBCR: 60.0000 mg | EXTENDED_RELEASE_TABLET | Freq: Two times a day (BID) | ORAL | 0 refills | Status: DC

## 2019-06-05 MED ORDER — MORPHINE SULFATE ER 30 MG PO TBCR: 30.0000 mg | EXTENDED_RELEASE_TABLET | Freq: Two times a day (BID) | ORAL | 0 refills | Status: DC

## 2019-06-05 NOTE — Progress Notes (Signed)
Subjective:    Patient ID: [name], female    DOB: 29-Nov-1960, 59 y.o.   MRN: [id number]  HPI: [name] is a 59 y.o. female who returns for follow up appointment for chronic pain and medication refill. She states her pain is located in her mid- back mainly left side, lower back pain mainly left side, bilateral hips and bilateral knee pain. She rates her Pain 7. Her  current exercise regime is walking and performing stretching exercises.  Ms. Stingle Morphine equivalent is 180.58  MME.    Last 05/08/2019, it was consistent.   Pain Inventory Average Pain 8 Pain Right Now 7 My pain is constant, sharp, burning, dull, stabbing, tingling and aching  In the last 24 hours, has pain interfered with the following? General activity 10 Relation with others 10 Enjoyment of life 10 What TIME of day is your pain at its worst? all Sleep (in general) Poor  Pain is worse with: walking, bending, standing and some activites Pain improves with: rest, heat/ice, medication, TENS and injections Relief from Meds: 6  Mobility use a cane use a walker how many minutes can you walk? 5-7 ability to climb steps?  yes do you drive?  yes  Function disabled: date disabled 08/2005  Neuro/Psych weakness numbness tremor tingling trouble walking spasms dizziness confusion depression anxiety  Prior Studies Any changes since last visit?  no  Physicians involved in your care Any changes since last visit?  no   Family History  Problem Relation Age of Onset  . Depression Mother   . Hypertension Mother   . Hypertension Father   . Heart failure Father   . Diabetes Father   . Heart attack Father   . Colon cancer Neg Hx   . Esophageal cancer Neg Hx   . Stomach cancer Neg Hx   . Rectal cancer Neg Hx    Social History   Socioeconomic History  . Marital status: Married    Spouse name: [name]  . Number of children: 1  . Years of education: some college  . Highest  education level: Not on file  Occupational History  . Occupation: On disability    Employer: UNEMPLOYED  Tobacco Use  . Smoking status: Former Smoker    Packs/day: 0.50    Types: Cigarettes    Quit date: 02/06/2016    Years since quitting: 3.3  . Smokeless tobacco: Never Used  . Tobacco comment: going to try e-sticks to quit  Substance and Sexual Activity  . Alcohol use: No    Alcohol/week: 0.0 standard drinks  . Drug use: No  . Sexual activity: Not Currently    Partners: Male  Other Topics Concern  . Not on file  Social History Narrative   8-10 cups of soda a day.  On disability. No regular exercise.     Social Determinants of Health   Financial Resource Strain:   . Difficulty of Paying Living Expenses:   Food Insecurity:   . Worried About Charity fundraiser in the Last Year:   . Arboriculturist in the Last Year:   Transportation Needs:   . Film/video editor (Medical):   Marland Kitchen Lack of Transportation (Non-Medical):   Physical Activity:   . Days of Exercise per Week:   . Minutes of Exercise per Session:   Stress:   . Feeling of Stress :   Social Connections:   . Frequency of Communication with Friends and Family:   .  Frequency of Social Gatherings with Friends and Family:   . Attends Religious Services:   . Active Member of Clubs or Organizations:   . Attends Archivist Meetings:   Marland Kitchen Marital Status:    Past Surgical History:  Procedure Laterality Date  . ABDOMINAL HYSTERECTOMY  May 2004  . Basel Cell Carcinoma  06/2005, 07/2005   nasal tip and reconstruction  . BREAST CYST ASPIRATION    . CARPAL TUNNEL RELEASE  07/1993   both hands  . COLONOSCOPY    . HEMORROIDECTOMY  July 2003  . KNEE ARTHROPLASTY  09/2001, 05/2003   left knee, chondromalasia  . KNEE ARTHROSCOPY  jan 2005   Right knee, tisse release, chrondromalacia  . REPLACEMENT TOTAL KNEE  Nov 2005   Left   . THYROID LOBECTOMY  01/2005   malignant area removed from right lobe  . THYROID  SURGERY     thyroid lobe removal  . TONSILLECTOMY  09/1972  . TUBAL LIGATION  Sept 1999  . Ulnar nerve entrapment  Feb 1995   left elbow   Past Medical History:  Diagnosis Date  . Allergy   . Anxiety   . Cancer ([place])    cancerous nodules on thyroid  . Cancer ([place])    basal cell Cancer-nose  . Chronic pain syndrome   . COPD (chronic obstructive pulmonary disease) ([place])   . Degeneration of lumbar or lumbosacral intervertebral disc   . Depression   . Facet syndrome, lumbar   . GERD (gastroesophageal reflux disease)   . Hyperlipidemia   . Intervertebral lumbar disc disorder with myelopathy, lumbar region   . Lumbosacral spondylosis without myelopathy   . Migraine without aura, with intractable migraine, so stated, without mention of status migrainosus   . Primary localized osteoarthrosis, lower leg   . Thyroid disease    hypothyroid  . Unspecified musculoskeletal disorders and symptoms referable to neck    cervical/trapezius   BP (!) 147/73   Pulse 94   Temp 97.9 F (36.6 C)   Ht 5\' 8"  (1.727 m)   Wt 276 lb (125.2 kg)   SpO2 94%   BMI 41.97 kg/m   Opioid Risk Score:   Fall Risk Score:  `1  Depression screen PHQ 2/9  Depression screen [facility] 2/9 06/05/2019 06/07/2018 01/28/2018 01/28/2018 10/11/2017 10/25/2016 08/25/2016  Decreased Interest 3 3 3 3 3 3 3   Down, Depressed, Hopeless 3 3 3 3 3 3 3   PHQ - 2 Score 6 6 6 6 6 6 6   Altered sleeping - - 3 3 3  - -  Tired, decreased energy - - 3 3 3  - -  Change in appetite - - 3 3 2  - -  Feeling bad or failure about yourself  - - 3 3 3  - -  Trouble concentrating - - 1 1 3  - -  Moving slowly or fidgety/restless - - 0 0 0 - -  Suicidal thoughts - - 2 2 0 - -  PHQ-9 Score - - 21 21 20  - -  Difficult doing work/chores - - Extremely dIfficult Extremely dIfficult Extremely dIfficult - -  Some encounter information is confidential and restricted. Go to Review Flowsheets activity to see all data.  Some recent data might be hidden     Review of Systems  Constitutional: Positive for diaphoresis and unexpected weight change.  HENT: Negative.   Eyes: Negative.   Respiratory: Negative.   Cardiovascular: Positive for leg swelling.  Gastrointestinal: Positive for constipation, nausea and  vomiting.  Endocrine: Negative.   Genitourinary: Negative.   Musculoskeletal: Positive for arthralgias, back pain, gait problem and joint swelling.       Spasms  Skin: Negative.   Neurological: Positive for tremors, weakness and numbness.       Tingling  Hematological: Negative.   Psychiatric/Behavioral: Positive for confusion and dysphoric mood. The patient is nervous/anxious.   All other systems reviewed and are negative.      Objective:   Physical Exam Vitals and nursing note reviewed.  Constitutional:      Appearance: Normal appearance. She is obese.  Cardiovascular:     Rate and Rhythm: Normal rate and regular rhythm.     Pulses: Normal pulses.     Heart sounds: Normal heart sounds.  Pulmonary:     Effort: Pulmonary effort is normal.     Breath sounds: Normal breath sounds.  Musculoskeletal:     Cervical back: Normal range of motion and neck supple.     Comments: Normal Muscle Bulk and Muscle Testing Reveals:  Upper Extremities: Full ROM and Muscle Strength 5/5 Thoracic Paraspinal Tenderness: T-7-T-9 Mainly Left Side Lumbar Paraspinal Tenderness: L-3-L-5 Bilateral Greater Trochanteric Tenderness Lower Extremities: Decreased ROM and Muscle Strength 5/5 Bilateral Lower Extremities Flexion Produces Pain into Bilateral Patella's Arises from Table Slowly using cane for support Antalgic  Gait   Skin:    General: Skin is warm and dry.  Neurological:     Mental Status: She is alert and oriented to person, place, and time.  Psychiatric:        Mood and Affect: Mood normal.        Behavior: Behavior normal.           Assessment & Plan:  1.Chronic Bilateral Thoracic Back Pain/ Low Back Pain/ Lumbar Facet  Arthropathy:RefilledMSIR15mg  one tablet every 6 hoursas needed #105andContinue withSlow weaning ofMorphine. Refilled:MS Contin 60 mg and 30 mg Q 12 hours to equal 90 mg #60.Continue Gabapentin and Pamelor.06/05/2019 We will continue the opioid monitoring program, this consists of regular clinic visits, examinations, urine drug screen, pill counts as well as use of [place] Controlled Substance Reporting System. 2. Degenerative Disk Disease:Encouraged Continue HEP as Tolertatedand heat therapy. Continue Current Medication Regime.06/05/2019. 3.Bilateral Greater Trochanteric Bursitis: Continuecurrent treatment regimenwith Heat and Ice Therapy.06/05/2019. 4. Osteoarthritis of Bilateral Knees: Continue current treatment modality with homeexerciseprogramand heat therapy.06/05/2019 5. Migraine Headaches:Continue withMigraine Journal.Continue: Aimovig,LamictalandMaxalt. Continue to Monitor.06/05/2019 6. Smoking Cessation: She has quit smokingsince 08/25/2016. Continue to Monitor.06/05/2019 7.Constipation: ContinueCurrent medication regime withLinzess.06/05/2019 8. Muscle Spasm: Continuecurrent medication regime withTizanidine.06/05/2019 9. DepressionAnxiety/ Panic Attacks: Continuecurrent medication regimen and treatment modality.Celexa.Lamictaland Counseling with [name] andDr.Akhtar Psychiatrist.06/05/2019 10. RSD: Continue with current medication Regimewith Gabapentin.06/05/2019  25minutes of face to face patient care time was spent during this visit. All questions were encouraged and answered.  F/U in 1 month

## 2019-06-11 ENCOUNTER — Other Ambulatory Visit (HOSPITAL_COMMUNITY): Payer: Medicare Other

## 2019-06-19 ENCOUNTER — Ambulatory Visit (INDEPENDENT_AMBULATORY_CARE_PROVIDER_SITE_OTHER): Payer: Medicare Other | Admitting: Licensed Clinical Social Worker

## 2019-06-19 DIAGNOSIS — F411 Generalized anxiety disorder: Secondary | ICD-10-CM

## 2019-06-19 DIAGNOSIS — F331 Major depressive disorder, recurrent, moderate: Secondary | ICD-10-CM | POA: Diagnosis not present

## 2019-06-19 DIAGNOSIS — G894 Chronic pain syndrome: Secondary | ICD-10-CM

## 2019-06-19 DIAGNOSIS — M5136 Other intervertebral disc degeneration, lumbar region: Secondary | ICD-10-CM | POA: Diagnosis not present

## 2019-06-19 NOTE — Progress Notes (Signed)
Virtual Visit via Telephone Note  I connected with [name] on 06/19/19 at  8:00 AM EDT by telephone and verified that I am speaking with the correct person using two identifiers.   I discussed the limitations, risks, security and privacy concerns of performing an evaluation and management service by telephone and the availability of in person appointments. I also discussed with the patient that there may be a patient responsible charge related to this service. The patient expressed understanding and agreed to proceed.   I discussed the assessment and treatment plan with the patient. The patient was provided an opportunity to ask questions and all were answered. The patient agreed with the plan and demonstrated an understanding of the instructions.   The patient was advised to call back or seek an in-person evaluation if the symptoms worsen or if the condition fails to improve as anticipated.  I provided 54 minutes of non-face-to-face time during this encounter.   THERAPIST PROGRESS NOTE  Session Time: 8:00 AM to 8:54 AM  Participation Level: Active  Behavioral Response: CasualAlertAnxious, Dysphoric and Euthymic  Type of Therapy: Individual Therapy  Treatment Goals addressed:  Decrease in depression, anxiety and panic    Interventions:  Decrease in depression, anxiety and panic    Summary: [name] is a 59 y.o. female who presents with doesn't get out much. Mom's estate finally coming to the very end of settling things. Shared memories about family life with her parents and also with her own family. 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 May be a cause of her medical problems, how hard she worked, worked about jobs very physically demanding. Describes doing 11 lawns in one day. Drives her crazy that she can't do a garden. Loved being outside and taking care of flowers. Talked about somebody who  has helped them out for years, how developed close relationship and nice to develop relationship with her and her kids who came out to [place] their lawn. Nice to have these relationships that have had for years. Talked about blog [employer], connecting people who need things with people giving away things. Discussed generosity of people and how it has given people things who really need it. Patient helped out through her connection with the application Next Door which connects her to neighbors. Discussed that you find out that people care about others and reach other to others. Done a bunch of things in neighborhood helping each other. Discussed eating habits and making an effort to eat healthy and not processed food how much it impacts weight that ties into our health.     Suicidal/Homicidal: No  Therapist Response: Therapist reviewed symptoms, significant intervention of facilitating expression of thoughts and feelings as patient is isolated with limited contact with others so therapeutic sessions are helpful as supportive as well as processing through feelings.  Assessed patient implementing coping strategies working actively on goal of decrease in depression anxiety as indicated by discussion today connecting through her app next-door enabling her to connect with others in her neighborhood and helpful coping strategy of helping others in her community helpful for positive impact on mood.  Utilize strength-based intervention reviewing patient's past history as hard worker, raising family as well as working 2 jobs.  Discussed past history as mood enhancer reviewing positive memories, positive aspects of past including raising family and teaching kids things that would be of value for them in their lives.  Reviewed importance of eating nutritionally and impact it has on ones physical health.  Plan: Return again in 3 weeks.2. Therapist work with patient on coping and provide strength based and supportive  intervention   Diagnosis: Axis I:   major depressive disorder, recurrent, moderate, degenerative disc disease, chronic pain syndrome, generalized anxiety disorder    Axis II: No diagnosis    [name], LCSW 06/19/2019

## 2019-07-03 ENCOUNTER — Encounter: Payer: Medicare Other | Attending: Physical Medicine and Rehabilitation | Admitting: Registered Nurse

## 2019-07-03 ENCOUNTER — Other Ambulatory Visit: Payer: Self-pay

## 2019-07-03 ENCOUNTER — Encounter: Payer: Self-pay | Admitting: Registered Nurse

## 2019-07-03 ENCOUNTER — Telehealth: Payer: Self-pay

## 2019-07-03 VITALS — BP 128/77 | HR 77 | Temp 98.9°F | Ht 68.0 in | Wt 277.0 lb

## 2019-07-03 DIAGNOSIS — M47816 Spondylosis without myelopathy or radiculopathy, lumbar region: Secondary | ICD-10-CM | POA: Diagnosis not present

## 2019-07-03 DIAGNOSIS — M5136 Other intervertebral disc degeneration, lumbar region: Secondary | ICD-10-CM | POA: Diagnosis not present

## 2019-07-03 DIAGNOSIS — Z5181 Encounter for therapeutic drug level monitoring: Secondary | ICD-10-CM | POA: Diagnosis not present

## 2019-07-03 DIAGNOSIS — M7062 Trochanteric bursitis, left hip: Secondary | ICD-10-CM

## 2019-07-03 DIAGNOSIS — M546 Pain in thoracic spine: Secondary | ICD-10-CM

## 2019-07-03 DIAGNOSIS — Z79899 Other long term (current) drug therapy: Secondary | ICD-10-CM | POA: Diagnosis not present

## 2019-07-03 DIAGNOSIS — M1712 Unilateral primary osteoarthritis, left knee: Secondary | ICD-10-CM

## 2019-07-03 DIAGNOSIS — M7061 Trochanteric bursitis, right hip: Secondary | ICD-10-CM

## 2019-07-03 DIAGNOSIS — G894 Chronic pain syndrome: Secondary | ICD-10-CM

## 2019-07-03 DIAGNOSIS — Z760 Encounter for issue of repeat prescription: Secondary | ICD-10-CM | POA: Insufficient documentation

## 2019-07-03 DIAGNOSIS — Z79891 Long term (current) use of opiate analgesic: Secondary | ICD-10-CM

## 2019-07-03 DIAGNOSIS — M1711 Unilateral primary osteoarthritis, right knee: Secondary | ICD-10-CM

## 2019-07-03 DIAGNOSIS — G8929 Other chronic pain: Secondary | ICD-10-CM

## 2019-07-03 MED ORDER — MORPHINE SULFATE ER 60 MG PO TBCR: 60.0000 mg | EXTENDED_RELEASE_TABLET | Freq: Two times a day (BID) | ORAL | 0 refills | Status: DC

## 2019-07-03 MED ORDER — MORPHINE SULFATE ER 30 MG PO TBCR: 30.0000 mg | EXTENDED_RELEASE_TABLET | Freq: Two times a day (BID) | ORAL | 0 refills | Status: DC

## 2019-07-03 MED ORDER — MORPHINE SULFATE 15 MG PO TABS: 15.0000 mg | ORAL_TABLET | Freq: Four times a day (QID) | ORAL | 0 refills | Status: DC | PRN

## 2019-07-03 NOTE — Progress Notes (Signed)
Subjective:    Patient ID: [name], female    DOB: February 07, 1960, 59 y.o.   MRN: [id number]  HPI: [name] is a 59 y.o. female who returns for follow up appointment for chronic pain and medication refill. She states her  pain is located in her mid- back  ( mainly left side), lower back pain, bilateral hips L>R and bilateral knee pain R>L. She rates her pain 8. Her current exercise regime is walking and performing stretching exercises.  Ms. Lifton Morphine equivalent is 232.Marland Kitchen42 MME.  Last UDS was Performed on 05/08/2019, it was consistent.   Pain Inventory Average Pain 8 Pain Right Now 8 My pain is constant, sharp, burning, dull, stabbing, tingling and aching  In the last 24 hours, has pain interfered with the following? General activity 10 Relation with others 10 Enjoyment of life 10 What TIME of day is your pain at its worst? all Sleep (in general) Poor  Pain is worse with: walking, bending, standing and some activites Pain improves with: rest, heat/ice, therapy/exercise, medication, TENS and injections Relief from Meds: 6  Mobility use a cane use a walker how many minutes can you walk? 5 ability to climb steps?  yes do you drive?  yes  Function not employed: date last employed 02/24/2003 disabled: date disabled 08/2005 I need assistance with the following:  bathing, meal prep, household duties and shopping  Neuro/Psych weakness numbness tremor tingling trouble walking spasms dizziness confusion depression anxiety  Prior Studies Any changes since last visit?  no bone scan x-rays CT/MRI nerve study  Physicians involved in your care Any changes since last visit?  no Primary care . Psychiatrist . therapist   Family History  Problem Relation Age of Onset  . Depression Mother   . Hypertension Mother   . Hypertension Father   . Heart failure Father   . Diabetes Father   . Heart attack Father   . Colon cancer Neg Hx   .  Esophageal cancer Neg Hx   . Stomach cancer Neg Hx   . Rectal cancer Neg Hx    Social History   Socioeconomic History  . Marital status: Married    Spouse name: [name]  . Number of children: 1  . Years of education: some college  . Highest education level: Not on file  Occupational History  . Occupation: On disability    Employer: UNEMPLOYED  Tobacco Use  . Smoking status: Former Smoker    Packs/day: 0.50    Types: Cigarettes    Quit date: 02/06/2016    Years since quitting: 3.4  . Smokeless tobacco: Never Used  . Tobacco comment: going to try e-sticks to quit  Substance and Sexual Activity  . Alcohol use: No    Alcohol/week: 0.0 standard drinks  . Drug use: No  . Sexual activity: Not Currently    Partners: Male  Other Topics Concern  . Not on file  Social History Narrative   8-10 cups of soda a day.  On disability. No regular exercise.     Social Determinants of Health   Financial Resource Strain:   . Difficulty of Paying Living Expenses:   Food Insecurity:   . Worried About Charity fundraiser in the Last Year:   . Arboriculturist in the Last Year:   Transportation Needs:   . Film/video editor (Medical):   Marland Kitchen Lack of Transportation (Non-Medical):   Physical Activity:   . Days of Exercise per Week:   .  Minutes of Exercise per Session:   Stress:   . Feeling of Stress :   Social Connections:   . Frequency of Communication with Friends and Family:   . Frequency of Social Gatherings with Friends and Family:   . Attends Religious Services:   . Active Member of Clubs or Organizations:   . Attends Archivist Meetings:   Marland Kitchen Marital Status:    Past Surgical History:  Procedure Laterality Date  . ABDOMINAL HYSTERECTOMY  May 2004  . Basel Cell Carcinoma  06/2005, 07/2005   nasal tip and reconstruction  . BREAST CYST ASPIRATION    . CARPAL TUNNEL RELEASE  07/1993   both hands  . COLONOSCOPY    . HEMORROIDECTOMY  July 2003  . KNEE ARTHROPLASTY   09/2001, 05/2003   left knee, chondromalasia  . KNEE ARTHROSCOPY  jan 2005   Right knee, tisse release, chrondromalacia  . REPLACEMENT TOTAL KNEE  Nov 2005   Left   . THYROID LOBECTOMY  01/2005   malignant area removed from right lobe  . THYROID SURGERY     thyroid lobe removal  . TONSILLECTOMY  09/1972  . TUBAL LIGATION  Sept 1999  . Ulnar nerve entrapment  Feb 1995   left elbow   Past Medical History:  Diagnosis Date  . Allergy   . Anxiety   . Cancer ([place])    cancerous nodules on thyroid  . Cancer ([place])    basal cell Cancer-nose  . Chronic pain syndrome   . COPD (chronic obstructive pulmonary disease) ([place])   . Degeneration of lumbar or lumbosacral intervertebral disc   . Depression   . Facet syndrome, lumbar   . GERD (gastroesophageal reflux disease)   . Hyperlipidemia   . Intervertebral lumbar disc disorder with myelopathy, lumbar region   . Lumbosacral spondylosis without myelopathy   . Migraine without aura, with intractable migraine, so stated, without mention of status migrainosus   . Primary localized osteoarthrosis, lower leg   . Thyroid disease    hypothyroid  . Unspecified musculoskeletal disorders and symptoms referable to neck    cervical/trapezius   BP 128/77   Pulse 77   Temp 98.9 F (37.2 C)   Ht 5\' 8"  (1.727 m)   Wt 277 lb (125.6 kg)   SpO2 95%   BMI 42.12 kg/m   Opioid Risk Score:   Fall Risk Score:  `1  Depression screen PHQ 2/9  Depression screen [facility] 2/9 06/05/2019 06/07/2018 01/28/2018 01/28/2018 10/11/2017 10/25/2016 08/25/2016  Decreased Interest 3 3 3 3 3 3 3   Down, Depressed, Hopeless 3 3 3 3 3 3 3   PHQ - 2 Score 6 6 6 6 6 6 6   Altered sleeping - - 3 3 3  - -  Tired, decreased energy - - 3 3 3  - -  Change in appetite - - 3 3 2  - -  Feeling bad or failure about yourself  - - 3 3 3  - -  Trouble concentrating - - 1 1 3  - -  Moving slowly or fidgety/restless - - 0 0 0 - -  Suicidal thoughts - - 2 2 0 - -  PHQ-9 Score - - 21 21 20  - -    Difficult doing work/chores - - Extremely dIfficult Extremely dIfficult Extremely dIfficult - -  Some encounter information is confidential and restricted. Go to Review Flowsheets activity to see all data.  Some recent data might be hidden    Review of Systems  Constitutional:  Positive for diaphoresis and unexpected weight change.  Gastrointestinal: Positive for constipation, nausea and vomiting.  Psychiatric/Behavioral: Positive for dysphoric mood. The patient is nervous/anxious.   All other systems reviewed and are negative.      Objective:   Physical Exam Vitals and nursing note reviewed.  Constitutional:      Appearance: Normal appearance.  Cardiovascular:     Rate and Rhythm: Normal rate and regular rhythm.     Pulses: Normal pulses.     Heart sounds: Normal heart sounds.  Pulmonary:     Effort: Pulmonary effort is normal.     Breath sounds: Normal breath sounds.  Musculoskeletal:     Cervical back: Normal range of motion and neck supple.     Comments: Normal Muscle Bulk and Muscle Testing Reveals:  Upper Extremities: Full ROM and Muscle Strength 5/5 Thoracic Paraspinal Tenderness: T-7-T-9 Mainly Left Side Lumbar Paraspinal Tenderness: L-3-L-5 Bilateral Greater Trochanter Tenderness Lower Extremities: Decreased ROM and Muscle Strength 5/5 Bilateral Lower Extremity Flexion Produces Pain into her Bilateral Patella's Arises from Table slowly using cane for support Antalgic Gait   Skin:    General: Skin is warm and dry.  Neurological:     Mental Status: She is alert and oriented to person, place, and time.  Psychiatric:        Mood and Affect: Mood normal.        Behavior: Behavior normal.           Assessment & Plan:  1.Chronic Bilateral Thoracic Back Pain/ Low Back Pain/ Lumbar Facet Arthropathy:RefilledMSIR15mg  one tablet every 6 hoursas needed #105andContinue withSlow weaning ofMorphine. Refilled:MS Contin 60 mg and 30 mg Q 12 hours to equal 90  mg #60.Continue Gabapentin and Pamelor.07/03/2019 We will continue the opioid monitoring program, this consists of regular clinic visits, examinations, urine drug screen, pill counts as well as use of [place] Controlled Substance Reporting System. 2. Degenerative Disk Disease:Encouraged Continue HEP as Tolertatedand heat therapy. Continue Current Medication Regime.07/03/2019. 3.Bilateral Greater Trochanteric Bursitis: Continuecurrent treatment regimenwith Heat and Ice Therapy.07/03/2019. 4. Osteoarthritis of Bilateral Knees: Continue current treatment modality with homeexerciseprogramand heat therapy.07/03/2019 5. Migraine Headaches:Continue withMigraine Journal. Aimovig discontinued due to Hives. Continue LamictalandMaxalt. Continue to Monitor.07/03/2019 6. Smoking Cessation: She has quit smokingsince 08/25/2016. Continue to Monitor.07/03/2019 7.Constipation: ContinueCurrent medication regime withLinzess.07/03/2019 8. Muscle Spasm: Continuecurrent medication regime withTizanidine.07/03/2019 9. DepressionAnxiety/ Panic Attacks: Continuecurrent medication regimen and treatment modality.Celexa.Lamictaland Counseling with [name] andDr.Akhtar Psychiatrist.07/03/2019 10. RSD: Continue with current medication Regimewith Gabapentin.07/03/2019  81minutes of face to face patient care time was spent during this visit. All questions were encouraged and answered.  F/U in 1 month

## 2019-07-03 NOTE — Telephone Encounter (Signed)
Lamictal #30 monthly

## 2019-07-04 MED ORDER — LAMOTRIGINE 25 MG PO TABS: ORAL_TABLET | ORAL | 2 refills | Status: DC

## 2019-07-04 NOTE — Telephone Encounter (Signed)
Return Ms. Loveall call , she reports she has about 20 tablets of her lamictal at this time.

## 2019-07-10 ENCOUNTER — Telehealth: Payer: Self-pay

## 2019-07-10 NOTE — Telephone Encounter (Signed)
Agree with documentation as above.   Marciana Joangel Vanosdol, MD  

## 2019-07-10 NOTE — Telephone Encounter (Signed)
[name] from [facility] called and states she is at patient's home. Patient "not feeling well" and running temp of 101. Patient called 911, once EMS arrived she asked them if they would call and see if Dr Madilyn Fireman would work her in to be seen in office today.   I advised [name] that there are no openings and if patient felt the need to call EMS, she needs to be taken to hospital for evaluation.   FYI to PCP  Patient last seen by PCP 01/28/18

## 2019-07-11 ENCOUNTER — Ambulatory Visit (HOSPITAL_COMMUNITY): Payer: Medicare Other | Admitting: Licensed Clinical Social Worker

## 2019-07-11 ENCOUNTER — Telehealth: Payer: Self-pay | Admitting: Family Medicine

## 2019-07-11 DIAGNOSIS — I959 Hypotension, unspecified: Secondary | ICD-10-CM | POA: Diagnosis not present

## 2019-07-11 DIAGNOSIS — Z79899 Other long term (current) drug therapy: Secondary | ICD-10-CM | POA: Diagnosis not present

## 2019-07-11 DIAGNOSIS — Z85850 Personal history of malignant neoplasm of thyroid: Secondary | ICD-10-CM | POA: Diagnosis not present

## 2019-07-11 DIAGNOSIS — Z85828 Personal history of other malignant neoplasm of skin: Secondary | ICD-10-CM | POA: Diagnosis not present

## 2019-07-11 DIAGNOSIS — Z888 Allergy status to other drugs, medicaments and biological substances status: Secondary | ICD-10-CM | POA: Diagnosis not present

## 2019-07-11 DIAGNOSIS — R0989 Other specified symptoms and signs involving the circulatory and respiratory systems: Secondary | ICD-10-CM | POA: Diagnosis not present

## 2019-07-11 DIAGNOSIS — G92 Toxic encephalopathy: Secondary | ICD-10-CM | POA: Diagnosis not present

## 2019-07-11 DIAGNOSIS — Z91048 Other nonmedicinal substance allergy status: Secondary | ICD-10-CM | POA: Diagnosis not present

## 2019-07-11 DIAGNOSIS — E890 Postprocedural hypothyroidism: Secondary | ICD-10-CM | POA: Diagnosis not present

## 2019-07-11 DIAGNOSIS — Z87891 Personal history of nicotine dependence: Secondary | ICD-10-CM | POA: Diagnosis not present

## 2019-07-11 DIAGNOSIS — G8929 Other chronic pain: Secondary | ICD-10-CM | POA: Diagnosis not present

## 2019-07-11 DIAGNOSIS — E876 Hypokalemia: Secondary | ICD-10-CM | POA: Diagnosis not present

## 2019-07-11 DIAGNOSIS — N3000 Acute cystitis without hematuria: Secondary | ICD-10-CM | POA: Diagnosis not present

## 2019-07-11 DIAGNOSIS — G43909 Migraine, unspecified, not intractable, without status migrainosus: Secondary | ICD-10-CM | POA: Diagnosis not present

## 2019-07-11 DIAGNOSIS — R079 Chest pain, unspecified: Secondary | ICD-10-CM | POA: Diagnosis not present

## 2019-07-11 DIAGNOSIS — N390 Urinary tract infection, site not specified: Secondary | ICD-10-CM | POA: Diagnosis not present

## 2019-07-11 DIAGNOSIS — B9620 Unspecified Escherichia coli [E. coli] as the cause of diseases classified elsewhere: Secondary | ICD-10-CM | POA: Diagnosis not present

## 2019-07-11 DIAGNOSIS — Z96653 Presence of artificial knee joint, bilateral: Secondary | ICD-10-CM | POA: Diagnosis not present

## 2019-07-11 DIAGNOSIS — R0902 Hypoxemia: Secondary | ICD-10-CM | POA: Diagnosis not present

## 2019-07-11 DIAGNOSIS — E1165 Type 2 diabetes mellitus with hyperglycemia: Secondary | ICD-10-CM | POA: Diagnosis not present

## 2019-07-11 DIAGNOSIS — Z886 Allergy status to analgesic agent status: Secondary | ICD-10-CM | POA: Diagnosis not present

## 2019-07-11 DIAGNOSIS — M549 Dorsalgia, unspecified: Secondary | ICD-10-CM | POA: Diagnosis not present

## 2019-07-11 DIAGNOSIS — R000 Tachycardia, unspecified: Secondary | ICD-10-CM | POA: Diagnosis not present

## 2019-07-11 DIAGNOSIS — Z743 Need for continuous supervision: Secondary | ICD-10-CM | POA: Diagnosis not present

## 2019-07-11 DIAGNOSIS — Z79891 Long term (current) use of opiate analgesic: Secondary | ICD-10-CM | POA: Diagnosis not present

## 2019-07-11 DIAGNOSIS — E871 Hypo-osmolality and hyponatremia: Secondary | ICD-10-CM | POA: Diagnosis not present

## 2019-07-11 DIAGNOSIS — R4020 Unspecified coma: Secondary | ICD-10-CM | POA: Diagnosis not present

## 2019-07-11 DIAGNOSIS — M1990 Unspecified osteoarthritis, unspecified site: Secondary | ICD-10-CM | POA: Diagnosis not present

## 2019-07-11 DIAGNOSIS — R404 Transient alteration of awareness: Secondary | ICD-10-CM | POA: Diagnosis not present

## 2019-07-11 MED ORDER — GENERIC EXTERNAL MEDICATION
Status: DC
Start: ? — End: 2019-07-11

## 2019-07-11 MED ORDER — GENERIC EXTERNAL MEDICATION
2.00 | Status: DC
Start: 2019-07-12 — End: 2019-07-11

## 2019-07-11 MED ORDER — GABAPENTIN 400 MG PO CAPS
800.00 | ORAL_CAPSULE | ORAL | Status: DC
Start: 2019-07-11 — End: 2019-07-11

## 2019-07-11 MED ORDER — LINACLOTIDE 145 MCG PO CAPS
145.00 | ORAL_CAPSULE | ORAL | Status: DC
Start: 2019-07-12 — End: 2019-07-11

## 2019-07-11 MED ORDER — LEVOTHYROXINE SODIUM 25 MCG PO TABS
25.00 | ORAL_TABLET | ORAL | Status: DC
Start: 2019-07-11 — End: 2019-07-11

## 2019-07-11 MED ORDER — TIZANIDINE HCL 4 MG PO TABS
2.00 | ORAL_TABLET | ORAL | Status: DC
Start: ? — End: 2019-07-11

## 2019-07-11 MED ORDER — NORTRIPTYLINE HCL 25 MG PO CAPS
100.00 | ORAL_CAPSULE | ORAL | Status: DC
Start: 2019-07-11 — End: 2019-07-11

## 2019-07-11 MED ORDER — POLYETHYLENE GLYCOL 3350 17 GM/SCOOP PO POWD
17.00 | ORAL | Status: DC
Start: ? — End: 2019-07-11

## 2019-07-11 MED ORDER — SODIUM CHLORIDE 0.9 % IV SOLN
10.00 | INTRAVENOUS | Status: DC
Start: ? — End: 2019-07-11

## 2019-07-11 MED ORDER — GUAIFENESIN 100 MG/5ML PO SYRP
200.00 | ORAL_SOLUTION | ORAL | Status: DC
Start: ? — End: 2019-07-11

## 2019-07-11 MED ORDER — ENOXAPARIN SODIUM 40 MG/0.4ML ~~LOC~~ SOLN
40.00 | SUBCUTANEOUS | Status: DC
Start: 2019-07-12 — End: 2019-07-11

## 2019-07-11 MED ORDER — LAMOTRIGINE 25 MG PO TABS
25.00 | ORAL_TABLET | ORAL | Status: DC
Start: 2019-07-12 — End: 2019-07-11

## 2019-07-11 MED ORDER — PRAVASTATIN SODIUM 40 MG PO TABS
40.00 | ORAL_TABLET | ORAL | Status: DC
Start: 2019-07-12 — End: 2019-07-11

## 2019-07-11 MED ORDER — LABETALOL HCL 5 MG/ML IV SOLN
20.00 | INTRAVENOUS | Status: DC
Start: ? — End: 2019-07-11

## 2019-07-11 MED ORDER — FAMOTIDINE 20 MG PO TABS
20.00 | ORAL_TABLET | ORAL | Status: DC
Start: 2019-07-11 — End: 2019-07-11

## 2019-07-11 MED ORDER — SODIUM CHLORIDE 0.9 % IV SOLN
50.00 | INTRAVENOUS | Status: DC
Start: ? — End: 2019-07-11

## 2019-07-11 NOTE — Progress Notes (Signed)
° °  THERAPIST PROGRESS NOTE  Session Time: 8:00 AM to 8:05 AM  Interventions: Supportive  Summary: [name] is a 59 y.o. female who presents with husband answering phone. Patient in the hospital. Yesterday called EMS because her temperature was 103. Called EMS brought it down to 101 they said take Tylenol every 6-8 hours. At 3 AM tried to wake up and incoherent as "if she was drunk". Her temperature was 108. This happened last year when she had to go in the hospital. Almost lost her. Husband to keep therapist updated as to how she is doing.    Plan: 1.Schedule appointment after discharge from hospital. 2. Therapist work with patient on coping and provide strength based and supportive intervention  Diagnosis: Axis I:   major depressive disorder, recurrent, moderate, degenerative disc disease, chronic pain syndrome, generalized anxiety disorder   Axis II: No diagnosis    [name], LCSW 07/11/2019

## 2019-07-11 NOTE — Telephone Encounter (Signed)
[name] was taken to the hospital Cherrie Gauze) by EMS at [facility] 07/11/19.  She was incoherent with a tempeture of 103.  Husband called to give the information. The appointment on 07/14/19 has been canceled.

## 2019-07-12 DIAGNOSIS — E871 Hypo-osmolality and hyponatremia: Secondary | ICD-10-CM | POA: Diagnosis not present

## 2019-07-12 DIAGNOSIS — R739 Hyperglycemia, unspecified: Secondary | ICD-10-CM | POA: Diagnosis not present

## 2019-07-12 DIAGNOSIS — G9341 Metabolic encephalopathy: Secondary | ICD-10-CM | POA: Diagnosis not present

## 2019-07-12 DIAGNOSIS — E079 Disorder of thyroid, unspecified: Secondary | ICD-10-CM | POA: Diagnosis not present

## 2019-07-13 DIAGNOSIS — E871 Hypo-osmolality and hyponatremia: Secondary | ICD-10-CM | POA: Diagnosis not present

## 2019-07-13 DIAGNOSIS — R739 Hyperglycemia, unspecified: Secondary | ICD-10-CM | POA: Diagnosis not present

## 2019-07-13 DIAGNOSIS — G9341 Metabolic encephalopathy: Secondary | ICD-10-CM | POA: Diagnosis not present

## 2019-07-13 DIAGNOSIS — E079 Disorder of thyroid, unspecified: Secondary | ICD-10-CM | POA: Diagnosis not present

## 2019-07-14 ENCOUNTER — Telehealth: Payer: Medicare Other | Admitting: Family Medicine

## 2019-07-16 ENCOUNTER — Ambulatory Visit (HOSPITAL_COMMUNITY): Payer: Medicare Other | Admitting: Licensed Clinical Social Worker

## 2019-07-16 ENCOUNTER — Other Ambulatory Visit: Payer: Self-pay | Admitting: *Deleted

## 2019-07-16 MED ORDER — LEVOTHYROXINE SODIUM 25 MCG PO TABS: ORAL_TABLET | ORAL | 1 refills | Status: DC

## 2019-07-16 NOTE — Progress Notes (Signed)
Phone system not working for visit per message by patient.

## 2019-07-22 ENCOUNTER — Telehealth: Payer: Self-pay | Admitting: Family Medicine

## 2019-07-22 NOTE — Telephone Encounter (Signed)
Can she come in at [facility]?

## 2019-07-22 NOTE — Telephone Encounter (Signed)
Patient was seen at [facility] in [place], for infected kidney. Needs something for this Friday for a hospital follow up. Only acute spots open. Would like something mid morning but let patient know you're only here half a day.

## 2019-07-23 ENCOUNTER — Ambulatory Visit: Payer: Medicare Other | Admitting: Family Medicine

## 2019-07-23 NOTE — Telephone Encounter (Signed)
Husband advised. She will not be able to make it here by 9 am. I did advised to come in as soon as possible.

## 2019-07-23 NOTE — Telephone Encounter (Signed)
Can she come at [facility] on Friday?

## 2019-07-24 NOTE — Telephone Encounter (Signed)
Appointment has been made. No further questions at this time.  

## 2019-07-25 ENCOUNTER — Encounter: Payer: Self-pay | Admitting: Family Medicine

## 2019-07-25 ENCOUNTER — Ambulatory Visit (INDEPENDENT_AMBULATORY_CARE_PROVIDER_SITE_OTHER): Payer: Medicare Other | Admitting: Family Medicine

## 2019-07-25 VITALS — BP 135/63 | HR 100 | Temp 98.8°F | Ht 68.0 in | Wt 273.0 lb

## 2019-07-25 DIAGNOSIS — N3281 Overactive bladder: Secondary | ICD-10-CM | POA: Diagnosis not present

## 2019-07-25 DIAGNOSIS — D179 Benign lipomatous neoplasm, unspecified: Secondary | ICD-10-CM | POA: Diagnosis not present

## 2019-07-25 DIAGNOSIS — L659 Nonscarring hair loss, unspecified: Secondary | ICD-10-CM | POA: Insufficient documentation

## 2019-07-25 DIAGNOSIS — B351 Tinea unguium: Secondary | ICD-10-CM

## 2019-07-25 DIAGNOSIS — E871 Hypo-osmolality and hyponatremia: Secondary | ICD-10-CM

## 2019-07-25 DIAGNOSIS — N3000 Acute cystitis without hematuria: Secondary | ICD-10-CM | POA: Diagnosis not present

## 2019-07-25 LAB — POCT URINALYSIS DIP (CLINITEK)
Bilirubin, UA: NEGATIVE
Blood, UA: NEGATIVE
Glucose, UA: NEGATIVE mg/dL
Ketones, POC UA: NEGATIVE mg/dL
Leukocytes, UA: NEGATIVE
Nitrite, UA: NEGATIVE
POC PROTEIN,UA: NEGATIVE
Spec Grav, UA: 1.015 (ref 1.010–1.025)
Urobilinogen, UA: 0.2 E.U./dL
pH, UA: 7 (ref 5.0–8.0)

## 2019-07-25 MED ORDER — CICLOPIROX 8 % EX SOLN: Freq: Every day | CUTANEOUS | 3 refills | Status: DC

## 2019-07-25 MED ORDER — TOLTERODINE TARTRATE ER 2 MG PO CP24: 2.0000 mg | ORAL_CAPSULE | Freq: Every day | ORAL | 1 refills | Status: DC

## 2019-07-25 NOTE — Progress Notes (Signed)
Established Patient Office Visit  Subjective:  Patient ID: [name], female    DOB: 17-May-1960  Age: 59 y.o. MRN: [id number]  CC:  Chief Complaint  Patient presents with  . Hospitalization Follow-up    HPI [name] presents for hospital follow-up.  She was admitted on June 4 and discharged home 2 days later on June 6 for altered mental status.  Her husband had noted that she was not acting like herself she was staring off and when he checked her temperature she was running a fever.  EMS was called and by the time she got to the ED she had a temperature of 102.6 with a heart rate of 104.  Her white blood cell count was 15,000 sodium was borderline low at 133.  She was diagnosed with a urinary tract infection and discharged home on Omnicef.  They also did a hemoglobin A1c and it was elevated at 6.6 in the diabetic range so she was started on Metformin at discharge as well.  The hyponatremia was corrected with IV fluids.  She did complete her 8 days of antibiotics she said she finished them about a week ago.  She said she was unaware that she was having any urinary symptoms because she constantly has frequency and urgency as well as episodes of incontinence.  So nothing seemed unusual or different for her she never experienced any dysuria.  She is also concerned about her great toenails on both feet.  She says they have a fungus and have even fallen off a couple times but even when the new nail grows back it starts to look abnormal get thick and brittle and yellow and bright.  She is tried multiple over-the-counter's without any significant improvement.  She says she did have one topical that seemed to help somewhat but if she missed a couple days it seemed to get worse again.  Has a couple skin lesions underneath her the skin on her forearms that she would like me to look at today.  Past Medical History:  Diagnosis Date  . Allergy   . Anxiety   . Cancer ([place])     cancerous nodules on thyroid  . Cancer ([place])    basal cell Cancer-nose  . Chronic pain syndrome   . COPD (chronic obstructive pulmonary disease) ([place])   . Degeneration of lumbar or lumbosacral intervertebral disc   . Depression   . Facet syndrome, lumbar   . GERD (gastroesophageal reflux disease)   . Hyperlipidemia   . Intervertebral lumbar disc disorder with myelopathy, lumbar region   . Lumbosacral spondylosis without myelopathy   . Migraine without aura, with intractable migraine, so stated, without mention of status migrainosus   . Primary localized osteoarthrosis, lower leg   . Thyroid disease    hypothyroid  . Unspecified musculoskeletal disorders and symptoms referable to neck    cervical/trapezius    Past Surgical History:  Procedure Laterality Date  . ABDOMINAL HYSTERECTOMY  May 2004  . Basel Cell Carcinoma  06/2005, 07/2005   nasal tip and reconstruction  . BREAST CYST ASPIRATION    . CARPAL TUNNEL RELEASE  07/1993   both hands  . COLONOSCOPY    . HEMORROIDECTOMY  July 2003  . KNEE ARTHROPLASTY  09/2001, 05/2003   left knee, chondromalasia  . KNEE ARTHROSCOPY  jan 2005   Right knee, tisse release, chrondromalacia  . REPLACEMENT TOTAL KNEE  Nov 2005   Left   . THYROID LOBECTOMY  01/2005  malignant area removed from right lobe  . THYROID SURGERY     thyroid lobe removal  . TONSILLECTOMY  09/1972  . TUBAL LIGATION  Sept 1999  . Ulnar nerve entrapment  Feb 1995   left elbow    Family History  Problem Relation Age of Onset  . Depression Mother   . Hypertension Mother   . Hypertension Father   . Heart failure Father   . Diabetes Father   . Heart attack Father   . Colon cancer Neg Hx   . Esophageal cancer Neg Hx   . Stomach cancer Neg Hx   . Rectal cancer Neg Hx     Social History   Socioeconomic History  . Marital status: Married    Spouse name: [name]  . Number of children: 1  . Years of education: some college  . Highest education level: Not on  file  Occupational History  . Occupation: On disability    Employer: UNEMPLOYED  Tobacco Use  . Smoking status: Former Smoker    Packs/day: 0.50    Types: Cigarettes    Quit date: 02/06/2016    Years since quitting: 3.4  . Smokeless tobacco: Never Used  . Tobacco comment: going to try e-sticks to quit  Substance and Sexual Activity  . Alcohol use: No    Alcohol/week: 0.0 standard drinks  . Drug use: No  . Sexual activity: Not Currently    Partners: Male  Other Topics Concern  . Not on file  Social History Narrative   8-10 cups of soda a day.  On disability. No regular exercise.     Social Determinants of Health   Financial Resource Strain:   . Difficulty of Paying Living Expenses:   Food Insecurity:   . Worried About Charity fundraiser in the Last Year:   . Arboriculturist in the Last Year:   Transportation Needs:   . Film/video editor (Medical):   Marland Kitchen Lack of Transportation (Non-Medical):   Physical Activity:   . Days of Exercise per Week:   . Minutes of Exercise per Session:   Stress:   . Feeling of Stress :   Social Connections:   . Frequency of Communication with Friends and Family:   . Frequency of Social Gatherings with Friends and Family:   . Attends Religious Services:   . Active Member of Clubs or Organizations:   . Attends Archivist Meetings:   Marland Kitchen Marital Status:   Intimate Partner Violence:   . Fear of Current or Ex-Partner:   . Emotionally Abused:   Marland Kitchen Physically Abused:   . Sexually Abused:     Outpatient Medications Prior to Visit  Medication Sig Dispense Refill  . Blood Glucose Monitoring Suppl (RELION CONFIRM GLUCOSE MONITOR) w/Device KIT by Does not apply route.    . citalopram (CELEXA) 40 MG tablet TAKE 1 TABLET(40 MG) BY MOUTH AT BEDTIME 30 tablet 5  . gabapentin (NEURONTIN) 800 MG tablet TAKE 1 TABLET(800 MG) BY MOUTH FOUR TIMES DAILY 120 tablet 5  . lactobacillus acidophilus & bulgar (LACTINEX) chewable tablet Chew 1 tablet by  mouth 3 (three) times daily with meals.  0  . lamoTRIgine (LAMICTAL) 25 MG tablet One tablet by mouth  daily 30 tablet 2  . levothyroxine (SYNTHROID) 25 MCG tablet TAKE 1 TABLET BY MOUTH DAILY BEFORE BREAKFAST 90 tablet 1  . LINZESS 145 MCG CAPS capsule TAKE 1 CAPSULE BY MOUTH DAILY 30 capsule 3  . metFORMIN (GLUCOPHAGE-XR)  500 MG 24 hr tablet Take 1 tablet by mouth daily with supper.    . morphine (MS CONTIN) 30 MG 12 hr tablet Take 1 tablet (30 mg total) by mouth every 12 (twelve) hours. 60 tablet 0  . morphine (MS CONTIN) 60 MG 12 hr tablet Take 1 tablet (60 mg total) by mouth every 12 (twelve) hours. 60 tablet 0  . morphine (MSIR) 15 MG tablet Take 1 tablet (15 mg total) by mouth every 6 (six) hours as needed for severe pain. Do Not Fill Before 07/08/2019 105 tablet 0  . nortriptyline (PAMELOR) 50 MG capsule TAKE 2 CAPSULES BY MOUTH AT BEDTIME 180 capsule 5  . pravastatin (PRAVACHOL) 40 MG tablet Take 1 tablet (40 mg total) by mouth daily. LAST REFILL MUST SCHEDULE AND KEEP APPOINTMENT LABS REQUIRED FOR REFILLS 30 tablet 0  . promethazine (PHENERGAN) 12.5 MG tablet TAKE 1 TABLET(12.5 MG) BY MOUTH EVERY 12 HOURS 30 tablet 2  . rizatriptan (MAXALT) 10 MG tablet TAKE 1 TABLET BY MOUTH AS NEEDED MAY REPEAT AFTER 2 HOURS AS NEEDED 30 tablet 5  . tiZANidine (ZANAFLEX) 2 MG tablet Take 1 tablet (2 mg total) by mouth 3 (three) times daily as needed. for muscle spams 90 tablet 5   No facility-administered medications prior to visit.    Allergies  Allergen Reactions  . Aspirin Other (See Comments)    Abdominal bleeding   . Ibuprofen Swelling  . Imitrex [Sumatriptan Base] Other (See Comments)    Drops BP   . Norvasc [Amlodipine Besylate] Swelling  . Nsaids Swelling  . Tape     Skin blisters under surgical tape  . Topamax [Topiramate] Other (See Comments)    Hair loss    ROS Review of Systems    Objective:    Physical Exam Constitutional:      Appearance: She is well-developed.   HENT:     Head: Normocephalic and atraumatic.  Cardiovascular:     Rate and Rhythm: Normal rate and regular rhythm.     Heart sounds: Normal heart sounds.  Pulmonary:     Effort: Pulmonary effort is normal.     Breath sounds: Normal breath sounds.  Skin:    General: Skin is warm and dry.     Comments: Scattered lipomas on forearms.    Neurological:     Mental Status: She is alert and oriented to person, place, and time.  Psychiatric:        Behavior: Behavior normal.     BP 135/63   Pulse 100   Temp 98.8 F (37.1 C)   Ht _0  (1.727 m)   Wt 273 lb (123.8 kg)   SpO2 94%   BMI 41.51 kg/m  Wt Readings from Last 3 Encounters:  07/25/19 273 lb (123.8 kg)  07/03/19 277 lb (125.6 kg)  06/05/19 276 lb (125.2 kg)     Health Maintenance Due  Topic Date Due  . COVID-19 Vaccine (1) Never done  . HIV Screening  Never done    There are no preventive care reminders to display for this patient.  Lab Results  Component Value Date   TSH 4.59 (H) 10/11/2017   Lab Results  Component Value Date   WBC 7.4 05/26/2019   HGB 13.4 05/26/2019   HCT 40.8 05/26/2019   MCV 91.1 05/26/2019   PLT 258 05/26/2019   Lab Results  Component Value Date   NA 141 05/26/2019   K 4.0 05/26/2019   CO2 28 05/26/2019  GLUCOSE 146 (H) 05/26/2019   BUN 9 05/26/2019   CREATININE 0.71 05/26/2019   BILITOT 1.0 05/26/2019   ALKPHOS 105 05/26/2015   AST 34 05/26/2019   ALT 25 05/26/2019   PROT 6.2 05/26/2019   ALBUMIN 4.0 05/26/2015   CALCIUM 8.9 05/26/2019   Lab Results  Component Value Date   CHOL 131 10/11/2017   Lab Results  Component Value Date   HDL 47 (L) 10/11/2017   Lab Results  Component Value Date   LDLCALC 60 10/11/2017   Lab Results  Component Value Date   TRIG 162 (H) 10/11/2017   Lab Results  Component Value Date   CHOLHDL 2.8 10/11/2017   Lab Results  Component Value Date   HGBA1C 6.1 (H) 10/11/2017      Assessment & Plan:   Problem List Items  Addressed This Visit      Musculoskeletal and Integument   Onychomycosis   Relevant Medications   ciclopirox (PENLAC) 8 % solution     Genitourinary   OAB (overactive bladder)     Other   Lipoma   Hair loss    Is a concern that his hair is getting a lot thinner.  She says she was started on Topamax recently and felt like it caused it to thin all over.  She had actually taken it years ago and it did the same thing.  She says she has stopped the Topamax but does not feel like she is got a lot of  We discussed a trial of over-the-counter women's Rogaine.  If not helping over the next 6 months then please let me know.       Other Visit Diagnoses    Hyponatremia    -  Primary   Relevant Orders   BASIC METABOLIC PANEL WITH GFR   Acute cystitis without hematuria          UTI-repeat sample obtained today and will send for culture just to confirm that we have completely cleared up the infection.  Hyponatremia due to recheck to make sure that this is stable as it was low during admission.  Onychomycosis-discussed treatments.  I rather start with something topical and oral that might be a little bit hard on her liver especially with the other medications that she is actively taking.  Overactive bladder-discussed options.  We will start with Detrol LA and follow-up in 4 to 6 weeks to see how she is doing and adjust her dose if needed.  Lipoma-gave reassurance these are benign skin  Meds ordered this encounter  Medications  . ciclopirox (PENLAC) 8 % solution    Sig: Apply topically at bedtime. Apply over nail and surrounding skin. Apply daily over previous coat. After seven (7) days, may remove with alcohol and continue cycle.    Dispense:  6.6 mL    Refill:  3  . tolterodine (DETROL LA) 2 MG 24 hr capsule    Sig: Take 1 capsule (2 mg total) by mouth daily.    Dispense:  30 capsule    Refill:  1    Follow-up: Return in about 4 weeks (around 08/22/2019) for follow up on bladder and  check moles.   Time spent in encounter 40 minutes including reviewing ED notes etc.    Beatrice Lecher, MD

## 2019-07-25 NOTE — Assessment & Plan Note (Signed)
Is a concern that his hair is getting a lot thinner.  She says she was started on Topamax recently and felt like it caused it to thin all over.  She had actually taken it years ago and it did the same thing.  She says she has stopped the Topamax but does not feel like she is got a lot of  We discussed a trial of over-the-counter women's Rogaine.  If not helping over the next 6 months then please let me know.

## 2019-07-25 NOTE — Addendum Note (Signed)
Addended by: Teddy Spike on: 07/25/2019 12:24 PM   Modules accepted: Orders

## 2019-07-26 LAB — BASIC METABOLIC PANEL WITH GFR
BUN: 13 mg/dL (ref 7–25)
CO2: 28 mmol/L (ref 20–32)
Calcium: 9.1 mg/dL (ref 8.6–10.4)
Chloride: 103 mmol/L (ref 98–110)
Creat: 0.61 mg/dL (ref 0.50–0.99)
GFR, Est African American: 113 mL/min/{1.73_m2} (ref >=60)
GFR, Est Non African American: 98 mL/min/{1.73_m2} (ref >=60)
Glucose, Bld: 136 mg/dL — ABNORMAL HIGH (ref 65–99)
Potassium: 4.4 mmol/L (ref 3.5–5.3)
Sodium: 141 mmol/L (ref 135–146)

## 2019-07-26 NOTE — Progress Notes (Signed)
All labs are normal. 

## 2019-08-01 ENCOUNTER — Encounter: Payer: Self-pay | Admitting: Registered Nurse

## 2019-08-01 ENCOUNTER — Other Ambulatory Visit: Payer: Self-pay

## 2019-08-01 ENCOUNTER — Encounter: Payer: Medicare Other | Attending: Physical Medicine and Rehabilitation | Admitting: Registered Nurse

## 2019-08-01 VITALS — BP 133/80 | HR 89 | Temp 97.7°F | Ht 68.0 in | Wt 272.4 lb

## 2019-08-01 DIAGNOSIS — Z79891 Long term (current) use of opiate analgesic: Secondary | ICD-10-CM

## 2019-08-01 DIAGNOSIS — M7061 Trochanteric bursitis, right hip: Secondary | ICD-10-CM | POA: Diagnosis not present

## 2019-08-01 DIAGNOSIS — M47816 Spondylosis without myelopathy or radiculopathy, lumbar region: Secondary | ICD-10-CM

## 2019-08-01 DIAGNOSIS — M51369 Other intervertebral disc degeneration, lumbar region without mention of lumbar back pain or lower extremity pain: Secondary | ICD-10-CM

## 2019-08-01 DIAGNOSIS — Z760 Encounter for issue of repeat prescription: Secondary | ICD-10-CM | POA: Diagnosis not present

## 2019-08-01 DIAGNOSIS — G8929 Other chronic pain: Secondary | ICD-10-CM

## 2019-08-01 DIAGNOSIS — G894 Chronic pain syndrome: Secondary | ICD-10-CM | POA: Diagnosis not present

## 2019-08-01 DIAGNOSIS — Z5181 Encounter for therapeutic drug level monitoring: Secondary | ICD-10-CM

## 2019-08-01 DIAGNOSIS — M1711 Unilateral primary osteoarthritis, right knee: Secondary | ICD-10-CM

## 2019-08-01 DIAGNOSIS — M546 Pain in thoracic spine: Secondary | ICD-10-CM

## 2019-08-01 DIAGNOSIS — Z79899 Other long term (current) drug therapy: Secondary | ICD-10-CM | POA: Diagnosis not present

## 2019-08-01 DIAGNOSIS — F329 Major depressive disorder, single episode, unspecified: Secondary | ICD-10-CM

## 2019-08-01 DIAGNOSIS — M7062 Trochanteric bursitis, left hip: Secondary | ICD-10-CM

## 2019-08-01 DIAGNOSIS — M1712 Unilateral primary osteoarthritis, left knee: Secondary | ICD-10-CM

## 2019-08-01 DIAGNOSIS — M5136 Other intervertebral disc degeneration, lumbar region: Secondary | ICD-10-CM

## 2019-08-01 MED ORDER — MORPHINE SULFATE 15 MG PO TABS: 15.0000 mg | ORAL_TABLET | Freq: Four times a day (QID) | ORAL | 0 refills | Status: DC | PRN

## 2019-08-01 MED ORDER — MORPHINE SULFATE ER 60 MG PO TBCR: 60.0000 mg | EXTENDED_RELEASE_TABLET | Freq: Two times a day (BID) | ORAL | 0 refills | Status: DC

## 2019-08-01 MED ORDER — MORPHINE SULFATE ER 30 MG PO TBCR: 30.0000 mg | EXTENDED_RELEASE_TABLET | Freq: Two times a day (BID) | ORAL | 0 refills | Status: DC

## 2019-08-01 NOTE — Progress Notes (Signed)
Subjective:    Patient ID: [name], female    DOB: [birth year], 59 y.o.   MRN: [id number]  HPI: [name] is a 59 y.o. female who returns for follow up appointment for chronic pain and medication refill. She states her pain is located in her mid- lower back pain, bilateral hip pain and bilateral knee pain R>L. She rates her pain 8. Her current exercise regime is walking and performing stretching exercises.  [name] went To [facility] ED on 07/11/2019 for Altered Mental Status, she was diagnosed with Acute Cystitis without hematuria. She was discharged on 07/13/2019. Discharge note was reviewed.   [name] Morphine equivalent is 236. 25 MME.     Last UDS was Performed on 05/08/2019, it was consistent.   Pain Inventory Average Pain 9 Pain Right Now 8 My pain is constant, sharp, burning, dull, stabbing and aching  In the last 24 hours, has pain interfered with the following? General activity 10 Relation with others 10 Enjoyment of life 10 What TIME of day is your pain at its worst? all Sleep (in general) Poor  Pain is worse with: walking, bending, sitting, standing and some activites Pain improves with: rest, heat/ice, medication, TENS and injections Relief from Meds: 6  Mobility how many minutes can you walk? 5 ability to climb steps?  yes do you drive?  yes  Function disabled: date disabled 2007 I need assistance with the following:  bathing, meal prep, household duties and shopping  Neuro/Psych weakness numbness tremor tingling trouble walking spasms dizziness confusion depression anxiety  Prior Studies Any changes since last visit?  yes went to ED 07/11/19 with altered mental status  Physicians involved in your care Any changes since last visit?  no   Family History  Problem Relation Age of Onset  . Depression Mother   . Hypertension Mother   . Hypertension Father   . Heart failure Father   . Diabetes Father   . Heart  attack Father   . Colon cancer Neg Hx   . Esophageal cancer Neg Hx   . Stomach cancer Neg Hx   . Rectal cancer Neg Hx    Social History   Socioeconomic History  . Marital status: Married    Spouse name: [name]  . Number of children: 1  . Years of education: some college  . Highest education level: Not on file  Occupational History  . Occupation: On disability    Employer: UNEMPLOYED  Tobacco Use  . Smoking status: Former Smoker    Packs/day: 0.50    Types: Cigarettes    Quit date: 02/06/2016    Years since quitting: 3.4  . Smokeless tobacco: Never Used  . Tobacco comment: going to try e-sticks to quit  Substance and Sexual Activity  . Alcohol use: No    Alcohol/week: 0.0 standard drinks  . Drug use: No  . Sexual activity: Not Currently    Partners: Male  Other Topics Concern  . Not on file  Social History Narrative   8-10 cups of soda a day.  On disability. No regular exercise.     Social Determinants of Health   Financial Resource Strain:   . Difficulty of Paying Living Expenses:   Food Insecurity:   . Worried About Charity fundraiser in the Last Year:   . Arboriculturist in the Last Year:   Transportation Needs:   . Film/video editor (Medical):   Marland Kitchen Lack of Transportation (Non-Medical):  Physical Activity:   . Days of Exercise per Week:   . Minutes of Exercise per Session:   Stress:   . Feeling of Stress :   Social Connections:   . Frequency of Communication with Friends and Family:   . Frequency of Social Gatherings with Friends and Family:   . Attends Religious Services:   . Active Member of Clubs or Organizations:   . Attends Archivist Meetings:   Marland Kitchen Marital Status:    Past Surgical History:  Procedure Laterality Date  . ABDOMINAL HYSTERECTOMY  May 2004  . Basel Cell Carcinoma  06/2005, 07/2005   nasal tip and reconstruction  . BREAST CYST ASPIRATION    . CARPAL TUNNEL RELEASE  07/1993   both hands  . COLONOSCOPY    .  HEMORROIDECTOMY  July 2003  . KNEE ARTHROPLASTY  09/2001, 05/2003   left knee, chondromalasia  . KNEE ARTHROSCOPY  jan 2005   Right knee, tisse release, chrondromalacia  . REPLACEMENT TOTAL KNEE  Nov 2005   Left   . THYROID LOBECTOMY  01/2005   malignant area removed from right lobe  . THYROID SURGERY     thyroid lobe removal  . TONSILLECTOMY  09/1972  . TUBAL LIGATION  Sept 1999  . Ulnar nerve entrapment  Feb 1995   left elbow   Past Medical History:  Diagnosis Date  . Allergy   . Anxiety   . Cancer ([place])    cancerous nodules on thyroid  . Cancer ([place])    basal cell Cancer-nose  . Chronic pain syndrome   . COPD (chronic obstructive pulmonary disease) ([place])   . Degeneration of lumbar or lumbosacral intervertebral disc   . Depression   . Facet syndrome, lumbar   . GERD (gastroesophageal reflux disease)   . Hyperlipidemia   . Intervertebral lumbar disc disorder with myelopathy, lumbar region   . Lumbosacral spondylosis without myelopathy   . Migraine without aura, with intractable migraine, so stated, without mention of status migrainosus   . Primary localized osteoarthrosis, lower leg   . Thyroid disease    hypothyroid  . Unspecified musculoskeletal disorders and symptoms referable to neck    cervical/trapezius   BP 133/80   Pulse 89   Temp 97.7 F (36.5 C)   Ht 5\' 8"  (1.727 m)   Wt 272 lb 6.4 oz (123.6 kg)   SpO2 94%   BMI 41.42 kg/m   Opioid Risk Score:   Fall Risk Score:  `1  Depression screen PHQ 2/9  Depression screen [facility] 2/9 08/01/2019 06/05/2019 06/07/2018 01/28/2018 01/28/2018 10/11/2017 10/25/2016  Decreased Interest 3 3 3 3 3 3 3   Down, Depressed, Hopeless 3 3 3 3 3 3 3   PHQ - 2 Score 6 6 6 6 6 6 6   Altered sleeping - - - 3 3 3  -  Tired, decreased energy - - - 3 3 3  -  Change in appetite - - - 3 3 2  -  Feeling bad or failure about yourself  - - - 3 3 3  -  Trouble concentrating - - - 1 1 3  -  Moving slowly or fidgety/restless - - - 0 0 0 -  Suicidal  thoughts - - - 2 2 0 -  PHQ-9 Score - - - 21 21 20  -  Difficult doing work/chores - - - Extremely dIfficult Extremely dIfficult Extremely dIfficult -  Some encounter information is confidential and restricted. Go to Review Flowsheets activity to see all data.  Some recent data might be hidden    Review of Systems  Constitutional: Positive for appetite change and diaphoresis.       Poor appetite  HENT: Negative.   Eyes: Negative.   Respiratory: Negative.   Cardiovascular: Positive for leg swelling.  Gastrointestinal: Positive for nausea and vomiting.  Endocrine: Negative.   Genitourinary: Positive for frequency and urgency.  Musculoskeletal: Positive for arthralgias, back pain, gait problem and myalgias.  Skin: Negative.   Allergic/Immunologic: Negative.   Neurological: Positive for dizziness, tremors, weakness and numbness.       [place]  Hematological: Negative.   Psychiatric/Behavioral: Positive for confusion and dysphoric mood. The patient is nervous/anxious.        Panic attacks  All other systems reviewed and are negative.      Objective:   Physical Exam Vitals and nursing note reviewed.  Constitutional:      Appearance: Normal appearance.  Cardiovascular:     Rate and Rhythm: Normal rate and regular rhythm.     Pulses: Normal pulses.     Heart sounds: Normal heart sounds.  Pulmonary:     Effort: Pulmonary effort is normal.     Breath sounds: Normal breath sounds.  Musculoskeletal:     Cervical back: Normal range of motion and neck supple.     Comments: Normal Muscle Bulk and Muscle Testing Reveals:  Upper Extremities: Full ROM and Muscle Strength 5/5 Thoracic Paraspinal Tenderness: T-3-T-6 Lumbar Paraspinal Tenderness: L-3-L-5 Bilateral Greater Trochanter Tenderness Lower Extremities: Decreased ROM and Muscle Strength 4/5 Arises from Table slowly using cane for support Antalgic Gait   Skin:    General: Skin is warm and dry.  Neurological:     Mental  Status: She is alert and oriented to person, place, and time.  Psychiatric:        Mood and Affect: Mood normal.        Behavior: Behavior normal.           Assessment & Plan:  1.Chronic Bilateral Thoracic Back Pain/ Low Back Pain/ Lumbar Facet Arthropathy:RefilledMSIR15mg  one tablet every 6 hoursas needed #105andContinue withSlow weaning ofMorphine. Refilled:MS Contin 60 mg and 30 mg Q 12 hours to equal 90 mg #60.Continue Gabapentin and Pamelor.08/01/2019 We will continue the opioid monitoring program, this consists of regular clinic visits, examinations, urine drug screen, pill counts as well as use of [place] Controlled Substance Reporting System. 2. Degenerative Disk Disease:Encouraged Continue HEP as Tolertatedand heat therapy. Continue Current Medication Regime.08/01/2019. 3.Bilateral Greater Trochanteric Bursitis: Continuecurrent treatment regimenwith Heat and Ice Therapy.08/01/2019. 4. Osteoarthritis of Bilateral Knees: Continue current treatment modality with homeexerciseprogramand heat therapy.08/01/2019 5. Migraine Headaches:Continue withMigraine Journal. Aimovig discontinued due to Hives. Continue LamictalandMaxalt. Continue to Monitor.08/01/2019 6. Smoking Cessation: She has quit smokingsince 08/25/2016. Continue to Monitor.08/01/2019 7.Constipation: ContinueCurrent medication regime withLinzess.08/01/2019 8. Muscle Spasm: Continuecurrent medication regime withTizanidine.08/01/2019 9. DepressionAnxiety/ Panic Attacks: Continuecurrent medication regimen and treatment modality.Celexa.Lamictaland Counseling with [name] andDr.Akhtar Psychiatrist.08/01/2019 10. RSD: Continue with current medication Regimewith Gabapentin.08/01/2019  52minutes of face to face patient care time was spent during this visit. All questions were encouraged and answered.  F/U in 1 month

## 2019-08-06 ENCOUNTER — Other Ambulatory Visit: Payer: Self-pay | Admitting: Registered Nurse

## 2019-08-08 ENCOUNTER — Ambulatory Visit (INDEPENDENT_AMBULATORY_CARE_PROVIDER_SITE_OTHER): Payer: Medicare Other | Admitting: Licensed Clinical Social Worker

## 2019-08-08 DIAGNOSIS — F411 Generalized anxiety disorder: Secondary | ICD-10-CM

## 2019-08-08 DIAGNOSIS — G894 Chronic pain syndrome: Secondary | ICD-10-CM | POA: Diagnosis not present

## 2019-08-08 DIAGNOSIS — F331 Major depressive disorder, recurrent, moderate: Secondary | ICD-10-CM

## 2019-08-08 NOTE — Progress Notes (Signed)
Virtual Visit via Telephone Note  Therapist-home office Patient-home I connected with [name] on 08/08/19 at  8:00 AM EDT by telephone and verified that I am speaking with the correct person using two identifiers.   I discussed the limitations, risks, security and privacy concerns of performing an evaluation and management service by telephone and the availability of in person appointments. I also discussed with the patient that there may be a patient responsible charge related to this service. The patient expressed understanding and agreed to proceed.  I discussed the assessment and treatment plan with the patient. The patient was provided an opportunity to ask questions and all were answered. The patient agreed with the plan and demonstrated an understanding of the instructions.   The patient was advised to call back or seek an in-person evaluation if the symptoms worsen or if the condition fails to improve as anticipated.  I provided 53 minutes of non-face-to-face time during this encounter.  THERAPIST PROGRESS NOTE  Session Time: 8:00 AM to 8:53 AM  Participation Level: Active  Behavioral Response: CasualAlertappropriate  Type of Therapy: Individual Therapy  Treatment Goals addressed:  Decrease in depression, anxiety and panic   Interventions: Solution Focused, Strength-based and Supportive,coping  Summary: [name] is a 59 y.o. female who presents with urinary tract injection fever got so high. Bruises all over arms where they had to help her. IV biotics for two days and finally temperature came down and felt more like herself. Afraid to give her the morphine. Let her go when she could eat. Never had urinary tract infection  until two years ago. Doesn't know the symptoms. Three of them in two years. If know symptoms could go to doctor and get antibiotics. Doesn't like going to the hospital. Shared that her Mom's stuff is done. Got her check. Decided she wants  to get her teeth done first and see where she is and then work on his house. Give her daughter [name] some of the money. Told her it was from grandmother because if from patient she wouldn't take it. Will give [name] money for plane tickets. Glad she is going back to school. Went back to discussing medical issue of going to hospital. Doctors talk about pee burning hasn't noticed it. Discussed tired of going to so many doctors shared medical history that half thyroid removed because cancer nodules on it so every couple of years has to check it. Second night at hospital figured out going through withdrawal, it was a rough night for her. After second night gave her enough to not go through withdrawal. As far as teeth used to smile and haven't done in years and miss that. Can improve mood to smile. Miss things she used to eat. Will be nice to have those things. Miss those things more than you think. Dreams about it. Anniversay of mom's birthday yesterday and they were together for lunch at the same time closing her estate, 4th of July is daddy's birthday.   Suicidal/Homicidal: No   Therapist Response: Therapist reviewed symptoms, facilitated expression of thoughts and feelings as patient processed her feelings related to recent hospital stay.  Provided strength based intervention and discussing patient using her check to help her daughters.  Also focused on positive of positive things that the check could bring her such as getting her teeth done that should bring positive development in her life.  Therapist provided supportive interventions active listening open questions.  Encourage patient to focus on her strengths, resources  and resiliency's.  Reviewed true stress management.  Plan: Return again in 4 weeks. 2.Therapist work with patient on coping and provide strength based and supportive intervention  Diagnosis: Axis I: major depressive disorder, recurrent, moderate, degenerative disc disease, chronic pain  syndrome, generalized anxiety disorder    Axis II: No diagnosis    [name], LCSW 08/08/2019

## 2019-08-12 ENCOUNTER — Other Ambulatory Visit: Payer: Self-pay | Admitting: Physical Medicine & Rehabilitation

## 2019-08-22 ENCOUNTER — Encounter (HOSPITAL_COMMUNITY): Payer: Self-pay | Admitting: Psychiatry

## 2019-08-22 ENCOUNTER — Telehealth (INDEPENDENT_AMBULATORY_CARE_PROVIDER_SITE_OTHER): Payer: Medicare Other | Admitting: Psychiatry

## 2019-08-22 DIAGNOSIS — G894 Chronic pain syndrome: Secondary | ICD-10-CM | POA: Diagnosis not present

## 2019-08-22 DIAGNOSIS — F411 Generalized anxiety disorder: Secondary | ICD-10-CM | POA: Diagnosis not present

## 2019-08-22 DIAGNOSIS — F331 Major depressive disorder, recurrent, moderate: Secondary | ICD-10-CM | POA: Diagnosis not present

## 2019-08-22 NOTE — Progress Notes (Signed)
[facility] Outpatient Follow up visit  Patient Identification: [name] MRN:  [id number] Date of Evaluation:  08/22/2019 Referral Source: primary care Chief Complaint:   depression follow up  Visit Diagnosis:    ICD-10-CM   1. Major depressive disorder, recurrent episode, moderate (HCC)  F33.1   2. GAD (generalized anxiety disorder)  F41.1   3. Chronic pain syndrome  G89.4     I connected with [name] on 08/22/19 at 10:00 AM EDT by telephone and verified that I am speaking with the correct person using two identifiers.      I discussed the limitations, risks, security and privacy concerns of performing an evaluation and management service by telephone and the availability of in person appointments. I also discussed with the patient that there may be a patient responsible charge related to this service. The patient expressed understanding and agreed to proceed. Patient location home Provider location home  History of Present Illness:  59 years old white married female. Referred by pain clinic for counselling and depression  Patient suffers from chronic back condition multiple pain conditions including at the joints surgery in the past including knee replacement.  She takes morphine 4 times a day also on gabapentin.  She takes Lamictal for migraine referred for possible counseling  Pain and migraines keep her limited  And effects mood Slow cut down on morphine but getting worried about it, also on gaba  Mostly getting therapy from our office, meds reviewed but pcp refills them Celexa keeping some balance in mood  Modifying factor: husband  Tolerating medications.  No rash Duration : more then 20 years  Aggravating F:multiple medical conditions , arthirits, Degenerative joints disease. Chronic back condition, migraine Limited movements   Past Psychiatric History: depression, anxiety, chronic pain  Previous Psychotropic Medications: Yes   Substance Abuse  History in the last 12 months:  No.  On pain meds as prescribed by provider for arthritis  Past Medical History:  Past Medical History:  Diagnosis Date  . Allergy   . Anxiety   . Cancer ([place])    cancerous nodules on thyroid  . Cancer ([place])    basal cell Cancer-nose  . Chronic pain syndrome   . COPD (chronic obstructive pulmonary disease) ([place])   . Degeneration of lumbar or lumbosacral intervertebral disc   . Depression   . Facet syndrome, lumbar   . GERD (gastroesophageal reflux disease)   . Hyperlipidemia   . Intervertebral lumbar disc disorder with myelopathy, lumbar region   . Lumbosacral spondylosis without myelopathy   . Migraine without aura, with intractable migraine, so stated, without mention of status migrainosus   . Primary localized osteoarthrosis, lower leg   . Thyroid disease    hypothyroid  . Unspecified musculoskeletal disorders and symptoms referable to neck    cervical/trapezius    Past Surgical History:  Procedure Laterality Date  . ABDOMINAL HYSTERECTOMY  May 2004  . Basel Cell Carcinoma  06/2005, 07/2005   nasal tip and reconstruction  . BREAST CYST ASPIRATION    . CARPAL TUNNEL RELEASE  07/1993   both hands  . COLONOSCOPY    . HEMORROIDECTOMY  July 2003  . KNEE ARTHROPLASTY  09/2001, 05/2003   left knee, chondromalasia  . KNEE ARTHROSCOPY  jan 2005   Right knee, tisse release, chrondromalacia  . REPLACEMENT TOTAL KNEE  Nov 2005   Left   . THYROID LOBECTOMY  01/2005   malignant area removed from right lobe  . THYROID SURGERY  thyroid lobe removal  . TONSILLECTOMY  09/1972  . TUBAL LIGATION  Sept 1999  . Ulnar nerve entrapment  Feb 1995   left elbow    Family Psychiatric History: mom : depression  Family History:  Family History  Problem Relation Age of Onset  . Depression Mother   . Hypertension Mother   . Hypertension Father   . Heart failure Father   . Diabetes Father   . Heart attack Father   . Colon cancer Neg Hx   . Esophageal  cancer Neg Hx   . Stomach cancer Neg Hx   . Rectal cancer Neg Hx     Social History:   Social History   Socioeconomic History  . Marital status: Married    Spouse name: [name]  . Number of children: 1  . Years of education: some college  . Highest education level: Not on file  Occupational History  . Occupation: On disability    Employer: UNEMPLOYED  Tobacco Use  . Smoking status: Former Smoker    Packs/day: 0.50    Types: Cigarettes    Quit date: 02/06/2016    Years since quitting: 3.5  . Smokeless tobacco: Never Used  . Tobacco comment: going to try e-sticks to quit  Substance and Sexual Activity  . Alcohol use: No    Alcohol/week: 0.0 standard drinks  . Drug use: No  . Sexual activity: Not Currently    Partners: Male  Other Topics Concern  . Not on file  Social History Narrative   8-10 cups of soda a day.  On disability. No regular exercise.     Social Determinants of Health   Financial Resource Strain:   . Difficulty of Paying Living Expenses:   Food Insecurity:   . Worried About Charity fundraiser in the Last Year:   . Arboriculturist in the Last Year:   Transportation Needs:   . Film/video editor (Medical):   Marland Kitchen Lack of Transportation (Non-Medical):   Physical Activity:   . Days of Exercise per Week:   . Minutes of Exercise per Session:   Stress:   . Feeling of Stress :   Social Connections:   . Frequency of Communication with Friends and Family:   . Frequency of Social Gatherings with Friends and Family:   . Attends Religious Services:   . Active Member of Clubs or Organizations:   . Attends Archivist Meetings:   Marland Kitchen Marital Status:       Allergies:   Allergies  Allergen Reactions  . Aspirin Other (See Comments)    Abdominal bleeding   . Ibuprofen Swelling  . Imitrex [Sumatriptan Base] Other (See Comments)    Drops BP   . Norvasc [Amlodipine Besylate] Swelling  . Nsaids Swelling  . Tape     Skin blisters under surgical  tape  . Topamax [Topiramate] Other (See Comments)    Hair loss    Metabolic Disorder Labs: Lab Results  Component Value Date   HGBA1C 6.1 (H) 10/11/2017   MPG 128 10/11/2017   MPG 131 05/26/2015   No results found for: PROLACTIN Lab Results  Component Value Date   CHOL 131 10/11/2017   TRIG 162 (H) 10/11/2017   HDL 47 (L) 10/11/2017   CHOLHDL 2.8 10/11/2017   VLDL 46 (H) 05/26/2015   LDLCALC 60 10/11/2017   LDLCALC 67 05/26/2015     Current Medications: Current Outpatient Medications  Medication Sig Dispense Refill  . Blood Glucose  Monitoring Suppl (RELION CONFIRM GLUCOSE MONITOR) w/Device KIT by Does not apply route.    . ciclopirox (PENLAC) 8 % solution Apply topically at bedtime. Apply over nail and surrounding skin. Apply daily over previous coat. After seven (7) days, may remove with alcohol and continue cycle. 6.6 mL 3  . citalopram (CELEXA) 40 MG tablet TAKE 1 TABLET(40 MG) BY MOUTH AT BEDTIME 30 tablet 5  . gabapentin (NEURONTIN) 800 MG tablet TAKE 1 TABLET(800 MG) BY MOUTH FOUR TIMES DAILY 120 tablet 5  . lactobacillus acidophilus & bulgar (LACTINEX) chewable tablet Chew 1 tablet by mouth 3 (three) times daily with meals.  0  . lamoTRIgine (LAMICTAL) 25 MG tablet TAKE 1 TABLET(25 MG) BY MOUTH DAILY 30 tablet 2  . levothyroxine (SYNTHROID) 25 MCG tablet TAKE 1 TABLET BY MOUTH DAILY BEFORE BREAKFAST 90 tablet 1  . LINZESS 145 MCG CAPS capsule TAKE 1 CAPSULE BY MOUTH DAILY 30 capsule 3  . metFORMIN (GLUCOPHAGE-XR) 500 MG 24 hr tablet Take 1 tablet by mouth daily with supper.    . morphine (MS CONTIN) 30 MG 12 hr tablet Take 1 tablet (30 mg total) by mouth every 12 (twelve) hours. Do Not Fill Before 08/02/2019 60 tablet 0  . morphine (MS CONTIN) 60 MG 12 hr tablet Take 1 tablet (60 mg total) by mouth every 12 (twelve) hours. Do Not Fill Before 08/02/2019 60 tablet 0  . morphine (MSIR) 15 MG tablet Take 1 tablet (15 mg total) by mouth every 6 (six) hours as needed for  severe pain. Do Not Fill Before 08/06/2019 105 tablet 0  . nortriptyline (PAMELOR) 50 MG capsule TAKE 2 CAPSULES BY MOUTH AT BEDTIME 180 capsule 5  . pravastatin (PRAVACHOL) 40 MG tablet Take 1 tablet (40 mg total) by mouth daily. LAST REFILL MUST SCHEDULE AND KEEP APPOINTMENT LABS REQUIRED FOR REFILLS 30 tablet 0  . promethazine (PHENERGAN) 12.5 MG tablet TAKE 1 TABLET(12.5 MG) BY MOUTH EVERY 12 HOURS 30 tablet 2  . rizatriptan (MAXALT) 10 MG tablet TAKE 1 TABLET BY MOUTH AS NEEDED MAY REPEAT AFTER 2 HOURS AS NEEDED 30 tablet 5  . tiZANidine (ZANAFLEX) 2 MG tablet Take 1 tablet (2 mg total) by mouth 3 (three) times daily as needed. for muscle spams 90 tablet 5  . tolterodine (DETROL LA) 2 MG 24 hr capsule Take 1 capsule (2 mg total) by mouth daily. 30 capsule 1   No current facility-administered medications for this visit.     Psychiatric Specialty Exam: Review of Systems  Constitutional: Positive for malaise/fatigue.  Cardiovascular: Negative for palpitations.  Musculoskeletal: Positive for back pain and myalgias. Negative for neck pain.  Skin: Negative for rash.    There were no vitals taken for this visit.There is no height or weight on file to calculate BMI.  General Appearance:   Eye Contact:   Speech:  Slow  Volume:  Normal  Mood:fair  Affect: constricted  Thought Process:  Goal Directed  Orientation:  Full (Time, Place, and Person)  Thought Content:  Rumination  Suicidal Thoughts:  No  Homicidal Thoughts:  No  Memory:  Immediate;   Fair Recent;   Fair  Judgement:  Fair  Insight:  Fair  Psychomotor Activity:  Decreased  Concentration:  Concentration: Fair and Attention Span: Fair  Recall:  [employer] of Knowledge:Good  Language: Good  Akathisia:  No  Handed:  Right  AIMS (if indicated):    Assets:  Desire for Improvement  ADL's:  Intact  Cognition: WNL  Sleep:  variable    Treatment Plan Summary: Medication management and Plan as follows  MDD, moderate  recurrent; baseline, continue celexa gets from PCP GADsporadic ,. Continue gabapentin, celexa  Supportive husband  Chronic back pain and ARthritis: on meds by providers.  Continue supportive andCBT with therapist if possible Has meds Fu 5-6 m. Questions addressed    I discussed the assessment and treatment plan with the patient. The patient was provided an opportunity to ask questions and all were answered. The patient agreed with the plan and demonstrated an understanding of the instructions.   The patient was advised to call back or seek an in-person evaluation if the symptoms worsen or if the condition fails to improve as anticipated. Non face to face time spent: 70mn  NMerian Capron MD 7/16/202110:11 AM

## 2019-08-26 ENCOUNTER — Encounter: Payer: Self-pay | Admitting: Family Medicine

## 2019-08-26 ENCOUNTER — Ambulatory Visit (INDEPENDENT_AMBULATORY_CARE_PROVIDER_SITE_OTHER): Payer: Medicare Other | Admitting: Family Medicine

## 2019-08-26 ENCOUNTER — Other Ambulatory Visit: Payer: Self-pay

## 2019-08-26 VITALS — BP 138/68 | HR 93 | Ht 68.0 in | Wt 272.0 lb

## 2019-08-26 DIAGNOSIS — L918 Other hypertrophic disorders of the skin: Secondary | ICD-10-CM | POA: Diagnosis not present

## 2019-08-26 DIAGNOSIS — N3281 Overactive bladder: Secondary | ICD-10-CM | POA: Diagnosis not present

## 2019-08-26 DIAGNOSIS — L820 Inflamed seborrheic keratosis: Secondary | ICD-10-CM

## 2019-08-26 MED ORDER — TOLTERODINE TARTRATE ER 4 MG PO CP24: 4.0000 mg | ORAL_CAPSULE | Freq: Every day | ORAL | 1 refills | Status: DC

## 2019-08-26 NOTE — Patient Instructions (Addendum)
For the skin lesion that we removed just clean the area with soap and water do not scrub at it.  Pat dry and apply Vaseline twice a day for 2 weeks.  The other lesions that we froze will eventually peel once they do just apply Vaseline twice a day until they heal.  Do not use alcohol or peroxide etc.  Give [place] a call back about 2 or 3 weeks and let me know if you feel like the increased dose of Detrol is helping.  If it is not then we can change medications completely.

## 2019-08-26 NOTE — Progress Notes (Signed)
Established Patient Office Visit  Subjective:  Patient ID: [name], female    DOB: [birth year]  Age: 59 y.o. MRN: [id number]  CC:  Chief Complaint  Patient presents with  . Nevus  . Over Active Bladder    she stated that she hasn't noticed a difference with the medication    HPI [name] presents for removal of several skin lesion she has several very large seborrheic keratoses underneath both breasts that get irritated on her clothing.  She also has a skin tag underneath the left axilla that she would like to have removed.  We are also following up on her overactive bladder today we decided to start her on Detrol 2 mg daily. She is not noticed any side effects medication but says she has not been very impressed with the results. She has not noticed significant change or improvement in her symptoms.  Past Medical History:  Diagnosis Date  . Allergy   . Anxiety   . Cancer ([place])    cancerous nodules on thyroid  . Cancer ([place])    basal cell Cancer-nose  . Chronic pain syndrome   . COPD (chronic obstructive pulmonary disease) ([place])   . Degeneration of lumbar or lumbosacral intervertebral disc   . Depression   . Facet syndrome, lumbar   . GERD (gastroesophageal reflux disease)   . Hyperlipidemia   . Intervertebral lumbar disc disorder with myelopathy, lumbar region   . Lumbosacral spondylosis without myelopathy   . Migraine without aura, with intractable migraine, so stated, without mention of status migrainosus   . Primary localized osteoarthrosis, lower leg   . Thyroid disease    hypothyroid  . Unspecified musculoskeletal disorders and symptoms referable to neck    cervical/trapezius    Past Surgical History:  Procedure Laterality Date  . ABDOMINAL HYSTERECTOMY  May 2004  . Basel Cell Carcinoma  06/2005, 07/2005   nasal tip and reconstruction  . BREAST CYST ASPIRATION    . CARPAL TUNNEL RELEASE  07/1993   both hands  . COLONOSCOPY    .  HEMORROIDECTOMY  July 2003  . KNEE ARTHROPLASTY  09/2001, 05/2003   left knee, chondromalasia  . KNEE ARTHROSCOPY  jan 2005   Right knee, tisse release, chrondromalacia  . REPLACEMENT TOTAL KNEE  Nov 2005   Left   . THYROID LOBECTOMY  01/2005   malignant area removed from right lobe  . THYROID SURGERY     thyroid lobe removal  . TONSILLECTOMY  09/1972  . TUBAL LIGATION  Sept 1999  . Ulnar nerve entrapment  Feb 1995   left elbow    Family History  Problem Relation Age of Onset  . Depression Mother   . Hypertension Mother   . Hypertension Father   . Heart failure Father   . Diabetes Father   . Heart attack Father   . Colon cancer Neg Hx   . Esophageal cancer Neg Hx   . Stomach cancer Neg Hx   . Rectal cancer Neg Hx     Social History   Socioeconomic History  . Marital status: Married    Spouse name: [name]  . Number of children: 1  . Years of education: some college  . Highest education level: Not on file  Occupational History  . Occupation: On disability    Employer: UNEMPLOYED  Tobacco Use  . Smoking status: Former Smoker    Packs/day: 0.50    Types: Cigarettes    Quit date: 02/06/2016  Years since quitting: 3.5  . Smokeless tobacco: Never Used  . Tobacco comment: going to try e-sticks to quit  Substance and Sexual Activity  . Alcohol use: No    Alcohol/week: 0.0 standard drinks  . Drug use: No  . Sexual activity: Not Currently    Partners: Male  Other Topics Concern  . Not on file  Social History Narrative   8-10 cups of soda a day.  On disability. No regular exercise.     Social Determinants of Health   Financial Resource Strain:   . Difficulty of Paying Living Expenses:   Food Insecurity:   . Worried About Charity fundraiser in the Last Year:   . Arboriculturist in the Last Year:   Transportation Needs:   . Film/video editor (Medical):   Marland Kitchen Lack of Transportation (Non-Medical):   Physical Activity:   . Days of Exercise per Week:   .  Minutes of Exercise per Session:   Stress:   . Feeling of Stress :   Social Connections:   . Frequency of Communication with Friends and Family:   . Frequency of Social Gatherings with Friends and Family:   . Attends Religious Services:   . Active Member of Clubs or Organizations:   . Attends Archivist Meetings:   Marland Kitchen Marital Status:   Intimate Partner Violence:   . Fear of Current or Ex-Partner:   . Emotionally Abused:   Marland Kitchen Physically Abused:   . Sexually Abused:     Outpatient Medications Prior to Visit  Medication Sig Dispense Refill  . Blood Glucose Monitoring Suppl (RELION CONFIRM GLUCOSE MONITOR) w/Device KIT by Does not apply route.    . ciclopirox (PENLAC) 8 % solution Apply topically at bedtime. Apply over nail and surrounding skin. Apply daily over previous coat. After seven (7) days, may remove with alcohol and continue cycle. 6.6 mL 3  . citalopram (CELEXA) 40 MG tablet TAKE 1 TABLET(40 MG) BY MOUTH AT BEDTIME 30 tablet 5  . gabapentin (NEURONTIN) 800 MG tablet TAKE 1 TABLET(800 MG) BY MOUTH FOUR TIMES DAILY 120 tablet 5  . lamoTRIgine (LAMICTAL) 25 MG tablet TAKE 1 TABLET(25 MG) BY MOUTH DAILY 30 tablet 2  . levothyroxine (SYNTHROID) 25 MCG tablet TAKE 1 TABLET BY MOUTH DAILY BEFORE BREAKFAST 90 tablet 1  . LINZESS 145 MCG CAPS capsule TAKE 1 CAPSULE BY MOUTH DAILY 30 capsule 3  . metFORMIN (GLUCOPHAGE-XR) 500 MG 24 hr tablet Take 1 tablet by mouth daily with supper.    . morphine (MS CONTIN) 30 MG 12 hr tablet Take 1 tablet (30 mg total) by mouth every 12 (twelve) hours. Do Not Fill Before 08/02/2019 60 tablet 0  . morphine (MS CONTIN) 60 MG 12 hr tablet Take 1 tablet (60 mg total) by mouth every 12 (twelve) hours. Do Not Fill Before 08/02/2019 60 tablet 0  . morphine (MSIR) 15 MG tablet Take 1 tablet (15 mg total) by mouth every 6 (six) hours as needed for severe pain. Do Not Fill Before 08/06/2019 105 tablet 0  . nortriptyline (PAMELOR) 50 MG capsule TAKE 2  CAPSULES BY MOUTH AT BEDTIME 180 capsule 5  . pravastatin (PRAVACHOL) 40 MG tablet Take 1 tablet (40 mg total) by mouth daily. LAST REFILL MUST SCHEDULE AND KEEP APPOINTMENT LABS REQUIRED FOR REFILLS 30 tablet 0  . promethazine (PHENERGAN) 12.5 MG tablet TAKE 1 TABLET(12.5 MG) BY MOUTH EVERY 12 HOURS 30 tablet 2  . rizatriptan (MAXALT) 10 MG  tablet TAKE 1 TABLET BY MOUTH AS NEEDED MAY REPEAT AFTER 2 HOURS AS NEEDED 30 tablet 5  . tiZANidine (ZANAFLEX) 2 MG tablet Take 1 tablet (2 mg total) by mouth 3 (three) times daily as needed. for muscle spams 90 tablet 5  . lactobacillus acidophilus & bulgar (LACTINEX) chewable tablet Chew 1 tablet by mouth 3 (three) times daily with meals.  0  . tolterodine (DETROL LA) 2 MG 24 hr capsule Take 1 capsule (2 mg total) by mouth daily. 30 capsule 1   No facility-administered medications prior to visit.    Allergies  Allergen Reactions  . Aspirin Other (See Comments)    Abdominal bleeding   . Ibuprofen Swelling  . Imitrex [Sumatriptan Base] Other (See Comments)    Drops BP   . Norvasc [Amlodipine Besylate] Swelling  . Nsaids Swelling  . Tape     Skin blisters under surgical tape  . Topamax [Topiramate] Other (See Comments)    Hair loss    ROS Review of Systems    Objective:    Physical Exam  BP 138/68   Pulse 93   Ht _0  (1.727 m)   Wt 272 lb (123.4 kg)   SpO2 95%   BMI 41.36 kg/m  Wt Readings from Last 3 Encounters:  08/26/19 272 lb (123.4 kg)  08/01/19 272 lb 6.4 oz (123.6 kg)  07/25/19 273 lb (123.8 kg)     Health Maintenance Due  Topic Date Due  . HIV Screening  Never done    There are no preventive care reminders to display for this patient.  Lab Results  Component Value Date   TSH 4.59 (H) 10/11/2017   Lab Results  Component Value Date   WBC 7.4 05/26/2019   HGB 13.4 05/26/2019   HCT 40.8 05/26/2019   MCV 91.1 05/26/2019   PLT 258 05/26/2019   Lab Results  Component Value Date   NA 141 07/25/2019   K 4.4  07/25/2019   CO2 28 07/25/2019   GLUCOSE 136 (H) 07/25/2019   BUN 13 07/25/2019   CREATININE 0.61 07/25/2019   BILITOT 1.0 05/26/2019   ALKPHOS 105 05/26/2015   AST 34 05/26/2019   ALT 25 05/26/2019   PROT 6.2 05/26/2019   ALBUMIN 4.0 05/26/2015   CALCIUM 9.1 07/25/2019   Lab Results  Component Value Date   CHOL 131 10/11/2017   Lab Results  Component Value Date   HDL 47 (L) 10/11/2017   Lab Results  Component Value Date   LDLCALC 60 10/11/2017   Lab Results  Component Value Date   TRIG 162 (H) 10/11/2017   Lab Results  Component Value Date   CHOLHDL 2.8 10/11/2017   Lab Results  Component Value Date   HGBA1C 6.1 (H) 10/11/2017      Assessment & Plan:   Problem List Items Addressed This Visit      Genitourinary   OAB (overactive bladder) - Primary    Overactive bladder-we discussed options including increasing the Detrol versus switching medications completely. She is willing to try going up on the dose first we will try 4 mg if after 2 to 3 weeks she is not noticing any significant improvements then plan will be to try something else like Vesicare or Enablex or possibly even Myrbetriq. Again has not noticed any side effects such as dry mouth etc.      Relevant Medications   tolterodine (DETROL LA) 4 MG 24 hr capsule    Other Visit Diagnoses  Seborrheic keratoses, inflamed       Skin tag         Skin Tag Removal Procedure Note Diagnosis: inflamed skin tag Location: left axilla Informed Consent: Discussed risks (permanent scarring, infection, pain, bleeding, bruising, redness, and recurrence of the lesion) and benefits of the procedure, as well as the alternatives. She is aware that skin tags are benign lesions, and their removal is often not considered medically necessary. Informed consent was obtained. Preparation: The area was prepared in a standard fashion. Anesthesia: Lidocaine 1% with epinephrine Procedure Details: double blade was was used to  perform sharp removal. Aluminum chloride was applied for hemostasis. Ointment and bandage were applied where needed. The patient tolerated the procedure well. Total number of lesions treated: 1 Plan: The patient was instructed on post-op care. Recommend OTC analgesia as needed for pain.  Cryotherapy Procedure Note  Pre-operative Diagnosis: seborrheic keratoses  Post-operative Diagnosis: same   Locations: under both breasts   Indications: irritation from bra and clothing   Anesthesia: Lidocaine 1% with epinephrine without added sodium bicarbonate  Procedure Details  Patient informed of risks (permanent scarring, infection, light or dark discoloration, bleeding, infection, weakness, numbness and recurrence of the lesion) and benefits of the procedure and verbal informed consent obtained.  The areas are treated with liquid nitrogen therapy, frozen until ice ball extended 1-2 mm beyond lesion, allowed to thaw, and treated again. The patient tolerated procedure well.  The patient was instructed on post-op care, warned that there may be blister formation, redness and pain. Recommend OTC analgesia as needed for pain.  Condition: Stable  Complications: none.  Plan: 1. Instructed to keep the area dry and covered for 24-48h and clean thereafter. 2. Warning signs of infection were reviewed.   3. Return PRN    Meds ordered this encounter  Medications  . tolterodine (DETROL LA) 4 MG 24 hr capsule    Sig: Take 1 capsule (4 mg total) by mouth daily.    Dispense:  30 capsule    Refill:  1    Follow-up: Return if symptoms worsen or fail to improve.    Beatrice Lecher, MD

## 2019-08-26 NOTE — Assessment & Plan Note (Signed)
Overactive bladder-we discussed options including increasing the Detrol versus switching medications completely. She is willing to try going up on the dose first we will try 4 mg if after 2 to 3 weeks she is not noticing any significant improvements then plan will be to try something else like Vesicare or Enablex or possibly even Myrbetriq. Again has not noticed any side effects such as dry mouth etc.

## 2019-08-27 ENCOUNTER — Other Ambulatory Visit: Payer: Self-pay | Admitting: Physical Medicine & Rehabilitation

## 2019-08-29 ENCOUNTER — Other Ambulatory Visit: Payer: Self-pay

## 2019-08-29 ENCOUNTER — Encounter: Payer: Self-pay | Admitting: Registered Nurse

## 2019-08-29 ENCOUNTER — Encounter: Payer: Medicare Other | Attending: Physical Medicine and Rehabilitation | Admitting: Registered Nurse

## 2019-08-29 VITALS — BP 141/85 | HR 97 | Temp 98.5°F | Ht 68.0 in | Wt 269.2 lb

## 2019-08-29 DIAGNOSIS — Z79891 Long term (current) use of opiate analgesic: Secondary | ICD-10-CM | POA: Insufficient documentation

## 2019-08-29 DIAGNOSIS — Z79899 Other long term (current) drug therapy: Secondary | ICD-10-CM | POA: Diagnosis not present

## 2019-08-29 DIAGNOSIS — M47816 Spondylosis without myelopathy or radiculopathy, lumbar region: Secondary | ICD-10-CM | POA: Diagnosis not present

## 2019-08-29 DIAGNOSIS — M5136 Other intervertebral disc degeneration, lumbar region: Secondary | ICD-10-CM

## 2019-08-29 DIAGNOSIS — Z760 Encounter for issue of repeat prescription: Secondary | ICD-10-CM | POA: Insufficient documentation

## 2019-08-29 DIAGNOSIS — M1712 Unilateral primary osteoarthritis, left knee: Secondary | ICD-10-CM | POA: Diagnosis not present

## 2019-08-29 DIAGNOSIS — Z5181 Encounter for therapeutic drug level monitoring: Secondary | ICD-10-CM

## 2019-08-29 DIAGNOSIS — G894 Chronic pain syndrome: Secondary | ICD-10-CM | POA: Insufficient documentation

## 2019-08-29 DIAGNOSIS — M1711 Unilateral primary osteoarthritis, right knee: Secondary | ICD-10-CM | POA: Insufficient documentation

## 2019-08-29 DIAGNOSIS — M7062 Trochanteric bursitis, left hip: Secondary | ICD-10-CM

## 2019-08-29 DIAGNOSIS — M7061 Trochanteric bursitis, right hip: Secondary | ICD-10-CM

## 2019-08-29 DIAGNOSIS — G8929 Other chronic pain: Secondary | ICD-10-CM

## 2019-08-29 DIAGNOSIS — M546 Pain in thoracic spine: Secondary | ICD-10-CM

## 2019-08-29 MED ORDER — MORPHINE SULFATE 15 MG PO TABS: 15.0000 mg | ORAL_TABLET | Freq: Four times a day (QID) | ORAL | 0 refills | Status: DC | PRN

## 2019-08-29 MED ORDER — LINACLOTIDE 145 MCG PO CAPS: 145.0000 ug | ORAL_CAPSULE | Freq: Every day | ORAL | 3 refills | Status: DC

## 2019-08-29 MED ORDER — MORPHINE SULFATE ER 30 MG PO TBCR: 30.0000 mg | EXTENDED_RELEASE_TABLET | Freq: Two times a day (BID) | ORAL | 0 refills | Status: DC

## 2019-08-29 MED ORDER — GABAPENTIN 800 MG PO TABS: ORAL_TABLET | ORAL | 5 refills | Status: DC

## 2019-08-29 MED ORDER — MORPHINE SULFATE ER 60 MG PO TBCR: 60.0000 mg | EXTENDED_RELEASE_TABLET | Freq: Two times a day (BID) | ORAL | 0 refills | Status: DC

## 2019-08-29 NOTE — Progress Notes (Signed)
Subjective:    Patient ID: [name], female    DOB: March 16, 1960, 59 y.o.   MRN: [id number]  HPI: [name] is a 59 y.o. female who returns for follow up appointment for chronic pain and medication refill. She states her pain is located in her mid- back mainly left side, bilateral hip pain and bilateral knee pain L>R. She rates her pain 8. Her current exercise regime is walking and performing chair  Exercises daily.   Ms. He Morphine equivalent is 232.50 MME.  Last UDS was Performed on 05/08/2019, it was consistent.    Pain Inventory Average Pain 8 Pain Right Now 8 My pain is constant, sharp, burning, dull, stabbing, tingling and aching  In the last 24 hours, has pain interfered with the following? General activity 10 Relation with others 10 Enjoyment of life 10 What TIME of day is your pain at its worst? Pain around the clock. Sleep (in general) Poor  Pain is worse with: walking, bending, standing and some activites Pain improves with: rest, heat/ice, therapy/exercise, pacing activities, medication and TENS Relief from Meds: 6  Mobility use a cane use a walker how many minutes can you walk? 5 mins. ability to climb steps?  yes do you drive?  yes transfers alone Do you have any goals in this area?  yes  Function disabled: date disabled 2007 I need assistance with the following:  meal prep, household duties, shopping and Husband helps out at home. Do you have any goals in this area?  yes  Neuro/Psych bladder control problems weakness numbness tremor tingling trouble walking spasms dizziness confusion depression anxiety  Prior Studies Any changes since last visit?  no  Physicians involved in your care Any changes since last visit?  no   Family History  Problem Relation Age of Onset  . Depression Mother   . Hypertension Mother   . Hypertension Father   . Heart failure Father   . Diabetes Father   . Heart attack Father   .  Colon cancer Neg Hx   . Esophageal cancer Neg Hx   . Stomach cancer Neg Hx   . Rectal cancer Neg Hx    Social History   Socioeconomic History  . Marital status: Married    Spouse name: [name]  . Number of children: 1  . Years of education: some college  . Highest education level: Not on file  Occupational History  . Occupation: On disability    Employer: UNEMPLOYED  Tobacco Use  . Smoking status: Former Smoker    Packs/day: 0.50    Types: Cigarettes    Quit date: 02/06/2016    Years since quitting: 3.5  . Smokeless tobacco: Never Used  . Tobacco comment: going to try e-sticks to quit  Substance and Sexual Activity  . Alcohol use: No    Alcohol/week: 0.0 standard drinks  . Drug use: No  . Sexual activity: Not Currently    Partners: Male  Other Topics Concern  . Not on file  Social History Narrative   8-10 cups of soda a day.  On disability. No regular exercise.     Social Determinants of Health   Financial Resource Strain:   . Difficulty of Paying Living Expenses:   Food Insecurity:   . Worried About Charity fundraiser in the Last Year:   . Arboriculturist in the Last Year:   Transportation Needs:   . Film/video editor (Medical):   Marland Kitchen Lack of  Transportation (Non-Medical):   Physical Activity:   . Days of Exercise per Week:   . Minutes of Exercise per Session:   Stress:   . Feeling of Stress :   Social Connections:   . Frequency of Communication with Friends and Family:   . Frequency of Social Gatherings with Friends and Family:   . Attends Religious Services:   . Active Member of Clubs or Organizations:   . Attends Archivist Meetings:   Marland Kitchen Marital Status:    Past Surgical History:  Procedure Laterality Date  . ABDOMINAL HYSTERECTOMY  May 2004  . Basel Cell Carcinoma  06/2005, 07/2005   nasal tip and reconstruction  . BREAST CYST ASPIRATION    . CARPAL TUNNEL RELEASE  07/1993   both hands  . COLONOSCOPY    . HEMORROIDECTOMY  July 2003  .  KNEE ARTHROPLASTY  09/2001, 05/2003   left knee, chondromalasia  . KNEE ARTHROSCOPY  jan 2005   Right knee, tisse release, chrondromalacia  . REPLACEMENT TOTAL KNEE  Nov 2005   Left   . THYROID LOBECTOMY  01/2005   malignant area removed from right lobe  . THYROID SURGERY     thyroid lobe removal  . TONSILLECTOMY  09/1972  . TUBAL LIGATION  Sept 1999  . Ulnar nerve entrapment  Feb 1995   left elbow   Past Medical History:  Diagnosis Date  . Allergy   . Anxiety   . Cancer ([place])    cancerous nodules on thyroid  . Cancer ([place])    basal cell Cancer-nose  . Chronic pain syndrome   . COPD (chronic obstructive pulmonary disease) ([place])   . Degeneration of lumbar or lumbosacral intervertebral disc   . Depression   . Facet syndrome, lumbar   . GERD (gastroesophageal reflux disease)   . Hyperlipidemia   . Intervertebral lumbar disc disorder with myelopathy, lumbar region   . Lumbosacral spondylosis without myelopathy   . Migraine without aura, with intractable migraine, so stated, without mention of status migrainosus   . Primary localized osteoarthrosis, lower leg   . Thyroid disease    hypothyroid  . Unspecified musculoskeletal disorders and symptoms referable to neck    cervical/trapezius   BP (!) 141/85   Pulse 97   Temp 98.5 F (36.9 C)   Ht 5\' 8"  (1.727 m)   Wt (!) 269 lb 3.2 oz (122.1 kg)   SpO2 95%   BMI 40.93 kg/m   Opioid Risk Score:   Fall Risk Score:  `1  Depression screen PHQ 2/9  Depression screen [facility] 2/9 08/26/2019 08/01/2019 06/05/2019 06/07/2018 01/28/2018 01/28/2018 10/11/2017  Decreased Interest 2 3 3 3 3 3 3   Down, Depressed, Hopeless 2 3 3 3 3 3 3   PHQ - 2 Score 4 6 6 6 6 6 6   Altered sleeping 3 - - - 3 3 3   Tired, decreased energy 3 - - - 3 3 3   Change in appetite 3 - - - 3 3 2   Feeling bad or failure about yourself  3 - - - 3 3 3   Trouble concentrating 1 - - - 1 1 3   Moving slowly or fidgety/restless 0 - - - 0 0 0  Suicidal thoughts 0 - - - 2 2 0    PHQ-9 Score 17 - - - 21 21 20   Difficult doing work/chores Extremely dIfficult - - - Extremely dIfficult Extremely dIfficult Extremely dIfficult  Some encounter information is confidential and restricted. Go to  Review Flowsheets activity to see all data.  Some recent data might be hidden   Review of Systems  Constitutional: Negative.   HENT: Negative.   Respiratory: Negative.   Cardiovascular: Negative.   Gastrointestinal: Negative.   Endocrine: Negative.   Genitourinary: Positive for urgency.  Musculoskeletal: Positive for back pain and gait problem.  Skin: Negative.   Neurological: Positive for dizziness, weakness and numbness. Negative for tremors.  Psychiatric/Behavioral:       Depression, Anxiety       Objective:   Physical Exam Vitals and nursing note reviewed.  Constitutional:      Appearance: Normal appearance.  Cardiovascular:     Rate and Rhythm: Normal rate and regular rhythm.     Pulses: Normal pulses.     Heart sounds: Normal heart sounds.  Pulmonary:     Effort: Pulmonary effort is normal.     Breath sounds: Normal breath sounds.  Musculoskeletal:     Cervical back: Normal range of motion and neck supple.     Comments: Normal Muscle Bulk and Muscle Testing Reveals:  Upper Extremities: Full ROM and Muscle Strength 5/5  Thoracic Paraspinal Tenderness: T-7-T-9 Mainly Left Side  Lower Extremities: Decreased ROM and Muscle Strength 4/5  Bilateral Lower Extremities Flexion Produces Pain into her Bilateral Patella's Arises from Table slowly Antalgic  Gait   Skin:    General: Skin is warm and dry.  Neurological:     Mental Status: She is alert and oriented to person, place, and time.  Psychiatric:        Mood and Affect: Mood normal.        Behavior: Behavior normal.           Assessment & Plan:  1.Chronic Bilateral Thoracic Back Pain/ Low Back Pain/ Lumbar Facet Arthropathy:RefilledMSIR15mg  one tablet every 6 hoursas needed #105andContinue  withSlow weaning ofMorphine. Refilled:MS Contin 60 mg and 30 mg Q 12 hours to equal 90 mg #60.Continue Gabapentin and Pamelor.08/29/2019 We will continue the opioid monitoring program, this consists of regular clinic visits, examinations, urine drug screen, pill counts as well as use of [place] Controlled Substance Reporting System. 2. Degenerative Disk Disease:Encouraged Continue HEP as Tolertatedand heat therapy. Continue Current Medication Regime.08/29/2019. 3.Bilateral Greater Trochanteric Bursitis: Continuecurrent treatment regimenwith Heat and Ice Therapy.08/29/2019. 4. Osteoarthritis of Bilateral Knees: Continue current treatment modality with homeexerciseprogramand heat therapy.08/29/2019 5. Migraine Headaches:Continue withMigraine Journal. Aimovigdiscontinued due to Hives. ContinueLamictalandMaxalt. Continue to Monitor.08/29/2019 6. Smoking Cessation: She has quit smokingsince 08/25/2016. Continue to Monitor.08/29/2019 7.Constipation: ContinueCurrent medication regime withLinzess.08/29/2019 8. Muscle Spasm: Continuecurrent medication regime withTizanidine.08/29/2019 9. DepressionAnxiety/ Panic Attacks: Continuecurrent medication regimen and treatment modality.Celexa.Lamictaland Counseling with [name] andDr.Akhtar Psychiatrist.08/29/2019 10. RSD: Continue with current medication Regimewith Gabapentin.08/29/2019  34minutes of face to face patient care time was spent during this visit. All questions were encouraged and answered.  F/U in 1 month

## 2019-09-05 ENCOUNTER — Ambulatory Visit (INDEPENDENT_AMBULATORY_CARE_PROVIDER_SITE_OTHER): Payer: Medicare Other | Admitting: Licensed Clinical Social Worker

## 2019-09-05 DIAGNOSIS — G894 Chronic pain syndrome: Secondary | ICD-10-CM

## 2019-09-05 DIAGNOSIS — M5136 Other intervertebral disc degeneration, lumbar region: Secondary | ICD-10-CM

## 2019-09-05 DIAGNOSIS — F331 Major depressive disorder, recurrent, moderate: Secondary | ICD-10-CM | POA: Diagnosis not present

## 2019-09-05 DIAGNOSIS — F411 Generalized anxiety disorder: Secondary | ICD-10-CM | POA: Diagnosis not present

## 2019-09-05 NOTE — Progress Notes (Signed)
Virtual Visit via Telephone Note  Therapist-home office Patient-home I connected with [name] on 09/05/19 at  9:00 AM EDT by telephone and verified that I am speaking with the correct person using two identifiers.   I discussed the limitations, risks, security and privacy concerns of performing an evaluation and management service by telephone and the availability of in person appointments. I also discussed with the patient that there may be a patient responsible charge related to this service. The patient expressed understanding and agreed to proceed.   I discussed the assessment and treatment plan with the patient. The patient was provided an opportunity to ask questions and all were answered. The patient agreed with the plan and demonstrated an understanding of the instructions.   The patient was advised to call back or seek an in-person evaluation if the symptoms worsen or if the condition fails to improve as anticipated.  I provided 52 minutes of non-face-to-face time during this encounter.  THERAPIST PROGRESS NOTE  Session Time: 9:00 AM to 9:52 AM  Participation Level: Active  Behavioral Response: CasualAlertDysphoric  Type of Therapy: Individual Therapy  Treatment Goals addressed: Decrease in depression, anxiety and panic   Interventions: Solution Focused, Strength-based, Supportive, Reframing and Other: coping  Summary: [name] is a 59 y.o. female who presents with "doing alright". Not perky didn't sleep last night. Her knee is hurting not sure why because she hasn't done anything unusual but all last night and this morning bothering her. Haven't slept normal in years. Sleeps half hour to hour and half wakes up for 15 minutes and go back to sleep does this all day and night describes it causes her to be "off kilter". Not a regular cycle. discussed how lack of sleep causes major disruptions in functioning. Has had ever since pain issues started. Never  sleep enough to dream. Takes a long time to go to sleep and then doesn't stay asleep long enough never feel energetic or fully rested. Tried some sleepy stuff Ambien something else as well. Didn't impact her at all. Gave kids the money she was going to give them, gave sister, [name],  money because helped to pay mom's bill, sisters helped their mom and patient couldn't do anything.  Patient shared about sister working at a factory that led to patient talking about working on a saw for [employer] and shared always been good at a lot of different things. Good at school could read stuff and remember it. She feels that her mind used to be the best part of her but not anymore.  Therapist pointed out that she still seems sharp to therapist. Shared helping sisters such as  paying [name]'s phone bill for three years. Discussed things she enjoys such as watching shows streaming. Plans to get portable dentures with some of money inherited. Use the implants to hold them up so they don't fly around. Go back every six month to clean. Able to give daughter, [name] money (who hasn't been talking to patient) because she thinks it came from grandmother.  Therapist pointed out positive things patient is doing with her money  Suicidal/Homicidal: No  Therapist Response: Therapist reviewed symptoms, facilitated expression of thoughts and feelings, utilize intervention of patient expressing processing feelings related to significant stressors of pain issues, inability to sleep providing support, validating her feelings, utilize reframing to focus on some positive events in patient's life such as being able to use inheritance to help her family, get dentures gives her access to being  able to eat more things she enjoys.  Focuses well as regular things in patient's life she enjoys and encourage patient to talk about them such as books she is reading, streaming, engagement with her family.  Therapist provided active listening open questions  supportive intervention as well as strength based.  Plan: Return again in 2 weeks.2.  Therapist continue to use strength-based and supportive interventions well processing patient's feelings helping her with coping and stressors  Diagnosis: Axis I: major depressive disorder, recurrent, moderate, degenerative disc disease, chronic pain syndrome, generalized anxiety disorder    Axis II: No diagnosis    [name], LCSW 09/05/2019

## 2019-09-19 ENCOUNTER — Ambulatory Visit (HOSPITAL_COMMUNITY): Payer: Medicare Other | Admitting: Licensed Clinical Social Worker

## 2019-09-25 ENCOUNTER — Other Ambulatory Visit: Payer: Self-pay | Admitting: Family Medicine

## 2019-09-25 DIAGNOSIS — N3281 Overactive bladder: Secondary | ICD-10-CM

## 2019-09-29 ENCOUNTER — Other Ambulatory Visit: Payer: Self-pay

## 2019-09-29 ENCOUNTER — Encounter: Payer: Self-pay | Admitting: Registered Nurse

## 2019-09-29 ENCOUNTER — Encounter: Payer: Medicare Other | Attending: Physical Medicine and Rehabilitation | Admitting: Registered Nurse

## 2019-09-29 VITALS — BP 95/63 | HR 89 | Temp 97.7°F | Ht 68.5 in | Wt 264.0 lb

## 2019-09-29 DIAGNOSIS — Z5181 Encounter for therapeutic drug level monitoring: Secondary | ICD-10-CM | POA: Insufficient documentation

## 2019-09-29 DIAGNOSIS — M546 Pain in thoracic spine: Secondary | ICD-10-CM | POA: Diagnosis not present

## 2019-09-29 DIAGNOSIS — G8929 Other chronic pain: Secondary | ICD-10-CM

## 2019-09-29 DIAGNOSIS — Z79899 Other long term (current) drug therapy: Secondary | ICD-10-CM | POA: Insufficient documentation

## 2019-09-29 DIAGNOSIS — M542 Cervicalgia: Secondary | ICD-10-CM

## 2019-09-29 DIAGNOSIS — Z79891 Long term (current) use of opiate analgesic: Secondary | ICD-10-CM | POA: Diagnosis not present

## 2019-09-29 DIAGNOSIS — M47816 Spondylosis without myelopathy or radiculopathy, lumbar region: Secondary | ICD-10-CM | POA: Insufficient documentation

## 2019-09-29 DIAGNOSIS — M1711 Unilateral primary osteoarthritis, right knee: Secondary | ICD-10-CM | POA: Diagnosis not present

## 2019-09-29 DIAGNOSIS — M7062 Trochanteric bursitis, left hip: Secondary | ICD-10-CM

## 2019-09-29 DIAGNOSIS — Z760 Encounter for issue of repeat prescription: Secondary | ICD-10-CM | POA: Insufficient documentation

## 2019-09-29 DIAGNOSIS — M1712 Unilateral primary osteoarthritis, left knee: Secondary | ICD-10-CM | POA: Diagnosis not present

## 2019-09-29 DIAGNOSIS — G894 Chronic pain syndrome: Secondary | ICD-10-CM | POA: Diagnosis not present

## 2019-09-29 DIAGNOSIS — M7061 Trochanteric bursitis, right hip: Secondary | ICD-10-CM

## 2019-09-29 MED ORDER — MORPHINE SULFATE ER 30 MG PO TBCR: 30.0000 mg | EXTENDED_RELEASE_TABLET | Freq: Two times a day (BID) | ORAL | 0 refills | Status: DC

## 2019-09-29 MED ORDER — MORPHINE SULFATE ER 60 MG PO TBCR: 60.0000 mg | EXTENDED_RELEASE_TABLET | Freq: Two times a day (BID) | ORAL | 0 refills | Status: DC

## 2019-09-29 MED ORDER — NORTRIPTYLINE HCL 50 MG PO CAPS: 100.0000 mg | ORAL_CAPSULE | Freq: Every day | ORAL | 5 refills | Status: DC

## 2019-09-29 MED ORDER — MORPHINE SULFATE 15 MG PO TABS: 15.0000 mg | ORAL_TABLET | Freq: Four times a day (QID) | ORAL | 0 refills | Status: DC | PRN

## 2019-09-29 NOTE — Progress Notes (Signed)
Subjective:    Patient ID: [name], female    DOB: 26-Jan-1961, 59 y.o.   MRN: [id number]  HPI: [name] is a 59 y.o. female who returns for follow up appointment for chronic pain and medication refill. She states her pain is located in her neck, mid- back pain, bilateral hip pain and bilateral knee pain R>L. She rates her pain 9. Her current exercise regime is walking and performing chair stretching exercises.  Ms. Casebolt Morphine equivalent is 232.50 MME.  UDS ordered today.    Pain Inventory Average Pain 8 Pain Right Now 9 My pain is constant, sharp, dull, stabbing, tingling and aching  In the last 24 hours, has pain interfered with the following? General activity 10 Relation with others 10 Enjoyment of life 10 What TIME of day is your pain at its worst? morning , daytime, evening and night Sleep (in general) Poor  Pain is worse with: walking, bending, standing and some activites Pain improves with: rest, heat/ice, therapy/exercise, medication and injections Relief from Meds: 6  Family History  Problem Relation Age of Onset  . Depression Mother   . Hypertension Mother   . Hypertension Father   . Heart failure Father   . Diabetes Father   . Heart attack Father   . Colon cancer Neg Hx   . Esophageal cancer Neg Hx   . Stomach cancer Neg Hx   . Rectal cancer Neg Hx    Social History   Socioeconomic History  . Marital status: Married    Spouse name: [name]  . Number of children: 1  . Years of education: some college  . Highest education level: Not on file  Occupational History  . Occupation: On disability    Employer: UNEMPLOYED  Tobacco Use  . Smoking status: Former Smoker    Packs/day: 0.50    Types: Cigarettes    Quit date: 02/06/2016    Years since quitting: 3.6  . Smokeless tobacco: Never Used  . Tobacco comment: going to try e-sticks to quit  Substance and Sexual Activity  . Alcohol use: No    Alcohol/week: 0.0 standard  drinks  . Drug use: No  . Sexual activity: Not Currently    Partners: Male  Other Topics Concern  . Not on file  Social History Narrative   8-10 cups of soda a day.  On disability. No regular exercise.     Social Determinants of Health   Financial Resource Strain:   . Difficulty of Paying Living Expenses: Not on file  Food Insecurity:   . Worried About Charity fundraiser in the Last Year: Not on file  . Ran Out of Food in the Last Year: Not on file  Transportation Needs:   . Lack of Transportation (Medical): Not on file  . Lack of Transportation (Non-Medical): Not on file  Physical Activity:   . Days of Exercise per Week: Not on file  . Minutes of Exercise per Session: Not on file  Stress:   . Feeling of Stress : Not on file  Social Connections:   . Frequency of Communication with Friends and Family: Not on file  . Frequency of Social Gatherings with Friends and Family: Not on file  . Attends Religious Services: Not on file  . Active Member of Clubs or Organizations: Not on file  . Attends Archivist Meetings: Not on file  . Marital Status: Not on file   Past Surgical History:  Procedure Laterality Date  .  ABDOMINAL HYSTERECTOMY  May 2004  . Basel Cell Carcinoma  06/2005, 07/2005   nasal tip and reconstruction  . BREAST CYST ASPIRATION    . CARPAL TUNNEL RELEASE  07/1993   both hands  . COLONOSCOPY    . HEMORROIDECTOMY  July 2003  . KNEE ARTHROPLASTY  09/2001, 05/2003   left knee, chondromalasia  . KNEE ARTHROSCOPY  jan 2005   Right knee, tisse release, chrondromalacia  . REPLACEMENT TOTAL KNEE  Nov 2005   Left   . THYROID LOBECTOMY  01/2005   malignant area removed from right lobe  . THYROID SURGERY     thyroid lobe removal  . TONSILLECTOMY  09/1972  . TUBAL LIGATION  Sept 1999  . Ulnar nerve entrapment  Feb 1995   left elbow   Past Surgical History:  Procedure Laterality Date  . ABDOMINAL HYSTERECTOMY  May 2004  . Basel Cell Carcinoma  06/2005, 07/2005    nasal tip and reconstruction  . BREAST CYST ASPIRATION    . CARPAL TUNNEL RELEASE  07/1993   both hands  . COLONOSCOPY    . HEMORROIDECTOMY  July 2003  . KNEE ARTHROPLASTY  09/2001, 05/2003   left knee, chondromalasia  . KNEE ARTHROSCOPY  jan 2005   Right knee, tisse release, chrondromalacia  . REPLACEMENT TOTAL KNEE  Nov 2005   Left   . THYROID LOBECTOMY  01/2005   malignant area removed from right lobe  . THYROID SURGERY     thyroid lobe removal  . TONSILLECTOMY  09/1972  . TUBAL LIGATION  Sept 1999  . Ulnar nerve entrapment  Feb 1995   left elbow   Past Medical History:  Diagnosis Date  . Allergy   . Anxiety   . Cancer ([place])    cancerous nodules on thyroid  . Cancer ([place])    basal cell Cancer-nose  . Chronic pain syndrome   . COPD (chronic obstructive pulmonary disease) ([place])   . Degeneration of lumbar or lumbosacral intervertebral disc   . Depression   . Facet syndrome, lumbar   . GERD (gastroesophageal reflux disease)   . Hyperlipidemia   . Intervertebral lumbar disc disorder with myelopathy, lumbar region   . Lumbosacral spondylosis without myelopathy   . Migraine without aura, with intractable migraine, so stated, without mention of status migrainosus   . Primary localized osteoarthrosis, lower leg   . Thyroid disease    hypothyroid  . Unspecified musculoskeletal disorders and symptoms referable to neck    cervical/trapezius   Temp 98 F (36.7 C)   Ht 5' 8.5" (1.74 m)   Wt 264 lb (119.7 kg)   BMI 39.56 kg/m   Opioid Risk Score:   Fall Risk Score:  `1  Depression screen PHQ 2/9  Depression screen [facility] 2/9 08/29/2019 08/26/2019 08/01/2019 06/05/2019 06/07/2018 01/28/2018 01/28/2018  Decreased Interest 0 2 3 3 3 3 3   Down, Depressed, Hopeless 0 2 3 3 3 3 3   PHQ - 2 Score 0 4 6 6 6 6 6   Altered sleeping - 3 - - - 3 3  Tired, decreased energy - 3 - - - 3 3  Change in appetite - 3 - - - 3 3  Feeling bad or failure about yourself  - 3 - - - 3 3  Trouble  concentrating - 1 - - - 1 1  Moving slowly or fidgety/restless - 0 - - - 0 0  Suicidal thoughts - 0 - - - 2 2  PHQ-9 Score -  17 - - - 21 21  Difficult doing work/chores - Extremely dIfficult - - - Extremely dIfficult Extremely dIfficult  Some encounter information is confidential and restricted. Go to Review Flowsheets activity to see all data.  Some recent data might be hidden   Review of Systems  Constitutional: Negative.   HENT: Negative.   Eyes: Negative.   Respiratory: Negative.   Endocrine: Negative.   Genitourinary: Negative.   Musculoskeletal: Positive for arthralgias, back pain, gait problem and neck pain.  Skin: Negative.   Allergic/Immunologic: Negative.   Psychiatric/Behavioral: Negative.   All other systems reviewed and are negative.      Objective:   Physical Exam Vitals and nursing note reviewed.  Constitutional:      Appearance: Normal appearance. She is obese.  Neck:     Comments: Cervical Paraspinal Tenderness: C-5-C-6 Cardiovascular:     Rate and Rhythm: Normal rate and regular rhythm.     Pulses: Normal pulses.     Heart sounds: Normal heart sounds.  Pulmonary:     Effort: Pulmonary effort is normal.     Breath sounds: Normal breath sounds.  Musculoskeletal:     Cervical back: Normal range of motion and neck supple.     Comments: Normal Muscle Bulk and Muscle Testing Reveals:  Upper Extremities: Full ROM and Muscle Strength 5/5 Thoracic Paraspinal Tenderness: T-3-T-7 Lumbar Paraspinal Tenderness: L-3-L-5 Bilateral Greater Trochanteric Tenderness Lower Extremities: Decreased ROM and Muscle Strength 5/5 Bilateral Lower Extremities Flexion Produces Pain into her Bilateral Patella's Arises from Table slowly using cane for support Antalgic Gait   Skin:    General: Skin is warm and dry.  Neurological:     Mental Status: She is alert and oriented to person, place, and time.  Psychiatric:        Mood and Affect: Mood normal.        Behavior: Behavior  normal.           Assessment & Plan:  1.Chronic Bilateral Thoracic Back Pain/ Low Back Pain/ Lumbar Facet Arthropathy:RefilledMSIR15mg  one tablet every 6 hoursas needed #105andContinue withSlow weaning ofMorphine. Refilled:MS Contin 60 mg and 30 mg Q 12 hours to equal 90 mg #60.Continue Gabapentin and Pamelor.09/29/2019 We will continue the opioid monitoring program, this consists of regular clinic visits, examinations, urine drug screen, pill counts as well as use of [place] Controlled Substance Reporting system. A 12 month History has been reviewed on the [place] on 09/29/2019. 2. Degenerative Disk Disease:Encouraged Continue HEP as Tolertatedand heat therapy. Continue Current Medication Regime.09/29/2019. 3.Bilateral Greater Trochanteric Bursitis: Continuecurrent treatment regimenwith Heat and Ice Therapy.09/29/2019. 4. Osteoarthritis of Bilateral Knees: Continue current treatment modality with homeexerciseprogramand heat therapy.09/29/2019 5. Migraine Headaches:Continue withMigraine Journal. Aimovigdiscontinued due to Hives. ContinueLamictalandMaxalt. Continue to Monitor.09/29/2019 6. Smoking Cessation: She has quit smokingsince 08/25/2016. Continue to Monitor.09/29/2019 7.Constipation: ContinueCurrent medication regime withLinzess.09/29/2019 8. Muscle Spasm: Continuecurrent medication regime withTizanidine.09/29/2019 9. DepressionAnxiety/ Panic Attacks: Continuecurrent medication regimen and treatment modality.Celexa.Lamictaland Counseling with [name] andDr.Akhtar Psychiatrist.09/29/2019 10. RSD: Continue with current medication Regimewith Gabapentin.09/29/2019  87minutes of face to face patient care time was spent during this visit. All questions were encouraged and answered.  F/U in 1 month

## 2019-10-04 LAB — DRUG TOX MONITOR 1 W/CONF, ORAL FLD

## 2019-10-04 LAB — DRUG TOX ALC METAB W/CON, ORAL FLD: Alcohol Metabolite: NEGATIVE ng/mL (ref <25)

## 2019-10-09 ENCOUNTER — Ambulatory Visit (HOSPITAL_COMMUNITY): Payer: Medicare Other | Admitting: Licensed Clinical Social Worker

## 2019-10-09 ENCOUNTER — Telehealth: Payer: Self-pay | Admitting: *Deleted

## 2019-10-09 NOTE — Progress Notes (Signed)
   Called patient and phone got disconnected. Called patient again and went to voicemail. Phone seems to not be working right so unable to have phone session.

## 2019-10-09 NOTE — Telephone Encounter (Signed)
Oral swab drug screen was consistent for prescribed medications.  ?

## 2019-10-21 ENCOUNTER — Ambulatory Visit (INDEPENDENT_AMBULATORY_CARE_PROVIDER_SITE_OTHER): Payer: Medicare Other | Admitting: Licensed Clinical Social Worker

## 2019-10-21 DIAGNOSIS — F411 Generalized anxiety disorder: Secondary | ICD-10-CM | POA: Diagnosis not present

## 2019-10-21 DIAGNOSIS — F331 Major depressive disorder, recurrent, moderate: Secondary | ICD-10-CM

## 2019-10-21 DIAGNOSIS — M5136 Other intervertebral disc degeneration, lumbar region: Secondary | ICD-10-CM | POA: Diagnosis not present

## 2019-10-21 DIAGNOSIS — G894 Chronic pain syndrome: Secondary | ICD-10-CM | POA: Diagnosis not present

## 2019-10-21 NOTE — Progress Notes (Signed)
Virtual Visit via Telephone Note  Therapist-home office Patient-home I connected with [name] on 10/21/19 at  8:00 AM EDT by telephone and verified that I am speaking with the correct person using two identifiers.   I discussed the limitations, risks, security and privacy concerns of performing an evaluation and management service by telephone and the availability of in person appointments. I also discussed with the patient that there may be a patient responsible charge related to this service. The patient expressed understanding and agreed to proceed.   I discussed the assessment and treatment plan with the patient. The patient was provided an opportunity to ask questions and all were answered. The patient agreed with the plan and demonstrated an understanding of the instructions.   The patient was advised to call back or seek an in-person evaluation if the symptoms worsen or if the condition fails to improve as anticipated.  I provided 53 minutes of non-face-to-face time during this encounter.   THERAPIST PROGRESS NOTE  Session Time: 8:00 AM to 8:53 AM  Participation Level: Active  Behavioral Response: CasualAlertDysphoric  Type of Therapy: Individual Therapy  Treatment Goals addressed:  Decrease in depression, anxiety and panic  Interventions: Solution Focused, Strength-based, Supportive and Other: coping  Summary: [name] is a 59 y.o. female who presents with headaches for three days and struggling with it. Chair broke and knees hadn't bent that much in years hurt really bad. Broke her chair which is also the most comfortable chair. Won't be able to have another one until beginning of November. Has a bed that she is hanging out on which she can adjust so is managing. Discussed things she does for [name], her grandchild. Therapist pointed out like a true grandmother and special relationship you have with your grandchildren. Patient talked about how it was fun  for her as well to do things like buy her costumes for Halloween. Has to pick up her dentures. Therapist provided feedback how things look forward to come to fruition. Looking forward to eating her favorite foods that haven't eaten in years. Never got used to how they feel when had them before. Hopes that these are better. They are the top of the line. Like something foreign and don't feel like regular teeth. Mouth feels crowded. Plan to get some work done on the house. A bunch of projects around the house and need a handyman.             Suicidal/Homicidal: No  Therapist Response: Therapist reviewed symptoms, facilitated expression of thoughts and feelings and today's focus was on positive developments for patient, therapist guiding patient recognizing slowly positive things do develop that she had wanted for herself.  Interventions tied to hopefulness and focus on positive to help with enhancement of mood.  Therapist always focuses on patient's strengths as she deals with difficult medical conditions and supportive interventions.  Therapist provided active listening open questions.  Therapist utilizing patient strengths highlighting them to help with coping such as patient's humor, her intellect her focus her drive to continue to manage despite difficulties with medical issues.  Plan: Return again in 1-2 months.(therapist on vacation so appointment is extended out) 2.Therapist continue to use strength-based and supportive interventions well processing patient's feelings helping her with coping and stressors  Diagnosis: Axis I:  major depressive disorder, recurrent, moderate, degenerative disc disease, chronic pain syndrome, generalized anxiety disorder    Axis II: No diagnosis    [name], LCSW 10/21/2019

## 2019-10-27 ENCOUNTER — Encounter: Payer: Self-pay | Admitting: Registered Nurse

## 2019-10-27 ENCOUNTER — Other Ambulatory Visit: Payer: Self-pay

## 2019-10-27 ENCOUNTER — Encounter: Payer: Medicare Other | Attending: Physical Medicine and Rehabilitation | Admitting: Registered Nurse

## 2019-10-27 VITALS — BP 105/72 | HR 72 | Temp 98.8°F | Ht 68.5 in | Wt 251.0 lb

## 2019-10-27 DIAGNOSIS — M1712 Unilateral primary osteoarthritis, left knee: Secondary | ICD-10-CM | POA: Diagnosis not present

## 2019-10-27 DIAGNOSIS — Z760 Encounter for issue of repeat prescription: Secondary | ICD-10-CM | POA: Diagnosis not present

## 2019-10-27 DIAGNOSIS — Z79899 Other long term (current) drug therapy: Secondary | ICD-10-CM | POA: Diagnosis not present

## 2019-10-27 DIAGNOSIS — M47816 Spondylosis without myelopathy or radiculopathy, lumbar region: Secondary | ICD-10-CM

## 2019-10-27 DIAGNOSIS — Z79891 Long term (current) use of opiate analgesic: Secondary | ICD-10-CM | POA: Diagnosis not present

## 2019-10-27 DIAGNOSIS — G894 Chronic pain syndrome: Secondary | ICD-10-CM | POA: Diagnosis not present

## 2019-10-27 DIAGNOSIS — G8929 Other chronic pain: Secondary | ICD-10-CM

## 2019-10-27 DIAGNOSIS — M7062 Trochanteric bursitis, left hip: Secondary | ICD-10-CM

## 2019-10-27 DIAGNOSIS — M7061 Trochanteric bursitis, right hip: Secondary | ICD-10-CM | POA: Diagnosis not present

## 2019-10-27 DIAGNOSIS — Z5181 Encounter for therapeutic drug level monitoring: Secondary | ICD-10-CM

## 2019-10-27 DIAGNOSIS — M1711 Unilateral primary osteoarthritis, right knee: Secondary | ICD-10-CM | POA: Diagnosis not present

## 2019-10-27 DIAGNOSIS — M546 Pain in thoracic spine: Secondary | ICD-10-CM

## 2019-10-27 MED ORDER — MORPHINE SULFATE 15 MG PO TABS: 15.0000 mg | ORAL_TABLET | Freq: Four times a day (QID) | ORAL | 0 refills | Status: DC | PRN

## 2019-10-27 MED ORDER — LAMOTRIGINE 25 MG PO TABS: ORAL_TABLET | ORAL | 3 refills | Status: DC

## 2019-10-27 MED ORDER — MORPHINE SULFATE ER 30 MG PO TBCR: 30.0000 mg | EXTENDED_RELEASE_TABLET | Freq: Two times a day (BID) | ORAL | 0 refills | Status: DC

## 2019-10-27 MED ORDER — MORPHINE SULFATE ER 60 MG PO TBCR: 60.0000 mg | EXTENDED_RELEASE_TABLET | Freq: Two times a day (BID) | ORAL | 0 refills | Status: DC

## 2019-10-27 NOTE — Progress Notes (Signed)
Subjective:    Patient ID: [name], female    DOB: Mar 16, 1960, 59 y.o.   MRN: [id number]  HPI: [name] is a 59 y.o. female who returns for follow up appointment for chronic pain and medication refill. She states her pain is located in her mid- lower back pain, bilateral hips and bilateral knee pain R>L. She rates her pain 8. Her current exercise regime is walking and performing chair  exercises.  Ms. Dismuke Morphine equivalent is 232.50 MME.    Last Oral Swab was Performed on 09/29/2019, it was consistent.    Pain Inventory Average Pain 8 Pain Right Now 8 My pain is constant, sharp, burning, dull, stabbing, tingling and aching  In the last 24 hours, has pain interfered with the following? General activity 10 Relation with others 10 Enjoyment of life 10 What TIME of day is your pain at its worst? morning , daytime, evening and night Sleep (in general) Poor  Pain is worse with: walking, bending, standing and some activites Pain improves with: rest, heat/ice, therapy/exercise, medication, TENS and injections Relief from Meds: 4  Family History  Problem Relation Age of Onset  . Depression Mother   . Hypertension Mother   . Hypertension Father   . Heart failure Father   . Diabetes Father   . Heart attack Father   . Colon cancer Neg Hx   . Esophageal cancer Neg Hx   . Stomach cancer Neg Hx   . Rectal cancer Neg Hx    Social History   Socioeconomic History  . Marital status: Married    Spouse name: [name]  . Number of children: 1  . Years of education: some college  . Highest education level: Not on file  Occupational History  . Occupation: On disability    Employer: UNEMPLOYED  Tobacco Use  . Smoking status: Former Smoker    Packs/day: 0.50    Types: Cigarettes    Quit date: 02/06/2016    Years since quitting: 3.7  . Smokeless tobacco: Never Used  . Tobacco comment: going to try e-sticks to quit  Substance and Sexual Activity  .  Alcohol use: No    Alcohol/week: 0.0 standard drinks  . Drug use: No  . Sexual activity: Not Currently    Partners: Male  Other Topics Concern  . Not on file  Social History Narrative   8-10 cups of soda a day.  On disability. No regular exercise.     Social Determinants of Health   Financial Resource Strain:   . Difficulty of Paying Living Expenses: Not on file  Food Insecurity:   . Worried About Charity fundraiser in the Last Year: Not on file  . Ran Out of Food in the Last Year: Not on file  Transportation Needs:   . Lack of Transportation (Medical): Not on file  . Lack of Transportation (Non-Medical): Not on file  Physical Activity:   . Days of Exercise per Week: Not on file  . Minutes of Exercise per Session: Not on file  Stress:   . Feeling of Stress : Not on file  Social Connections:   . Frequency of Communication with Friends and Family: Not on file  . Frequency of Social Gatherings with Friends and Family: Not on file  . Attends Religious Services: Not on file  . Active Member of Clubs or Organizations: Not on file  . Attends Archivist Meetings: Not on file  . Marital Status: Not on  file   Past Surgical History:  Procedure Laterality Date  . ABDOMINAL HYSTERECTOMY  May 2004  . Basel Cell Carcinoma  06/2005, 07/2005   nasal tip and reconstruction  . BREAST CYST ASPIRATION    . CARPAL TUNNEL RELEASE  07/1993   both hands  . COLONOSCOPY    . HEMORROIDECTOMY  July 2003  . KNEE ARTHROPLASTY  09/2001, 05/2003   left knee, chondromalasia  . KNEE ARTHROSCOPY  jan 2005   Right knee, tisse release, chrondromalacia  . REPLACEMENT TOTAL KNEE  Nov 2005   Left   . THYROID LOBECTOMY  01/2005   malignant area removed from right lobe  . THYROID SURGERY     thyroid lobe removal  . TONSILLECTOMY  09/1972  . TUBAL LIGATION  Sept 1999  . Ulnar nerve entrapment  Feb 1995   left elbow   Past Surgical History:  Procedure Laterality Date  . ABDOMINAL HYSTERECTOMY   May 2004  . Basel Cell Carcinoma  06/2005, 07/2005   nasal tip and reconstruction  . BREAST CYST ASPIRATION    . CARPAL TUNNEL RELEASE  07/1993   both hands  . COLONOSCOPY    . HEMORROIDECTOMY  July 2003  . KNEE ARTHROPLASTY  09/2001, 05/2003   left knee, chondromalasia  . KNEE ARTHROSCOPY  jan 2005   Right knee, tisse release, chrondromalacia  . REPLACEMENT TOTAL KNEE  Nov 2005   Left   . THYROID LOBECTOMY  01/2005   malignant area removed from right lobe  . THYROID SURGERY     thyroid lobe removal  . TONSILLECTOMY  09/1972  . TUBAL LIGATION  Sept 1999  . Ulnar nerve entrapment  Feb 1995   left elbow   Past Medical History:  Diagnosis Date  . Allergy   . Anxiety   . Cancer ([place])    cancerous nodules on thyroid  . Cancer ([place])    basal cell Cancer-nose  . Chronic pain syndrome   . COPD (chronic obstructive pulmonary disease) ([place])   . Degeneration of lumbar or lumbosacral intervertebral disc   . Depression   . Facet syndrome, lumbar   . GERD (gastroesophageal reflux disease)   . Hyperlipidemia   . Intervertebral lumbar disc disorder with myelopathy, lumbar region   . Lumbosacral spondylosis without myelopathy   . Migraine without aura, with intractable migraine, so stated, without mention of status migrainosus   . Primary localized osteoarthrosis, lower leg   . Thyroid disease    hypothyroid  . Unspecified musculoskeletal disorders and symptoms referable to neck    cervical/trapezius   BP 105/72 (BP Location: Left Arm, Patient Position: Sitting, Cuff Size: Normal)   Pulse 72   SpO2 93%   Opioid Risk Score:   Fall Risk Score:  `1  Depression screen PHQ 2/9  Depression screen [facility] 2/9 08/29/2019 08/26/2019 08/01/2019 06/05/2019 06/07/2018 01/28/2018 01/28/2018  Decreased Interest 0 2 3 3 3 3 3   Down, Depressed, Hopeless 0 2 3 3 3 3 3   PHQ - 2 Score 0 4 6 6 6 6 6   Altered sleeping - 3 - - - 3 3  Tired, decreased energy - 3 - - - 3 3  Change in appetite - 3 - - - 3 3    Feeling bad or failure about yourself  - 3 - - - 3 3  Trouble concentrating - 1 - - - 1 1  Moving slowly or fidgety/restless - 0 - - - 0 0  Suicidal thoughts -  0 - - - 2 2  PHQ-9 Score - 17 - - - 21 21  Difficult doing work/chores - Extremely dIfficult - - - Extremely dIfficult Extremely dIfficult  Some encounter information is confidential and restricted. Go to Review Flowsheets activity to see all data.  Some recent data might be hidden    Review of Systems  Constitutional: Negative.   HENT: Negative.   Eyes: Negative.   Respiratory: Negative.   Cardiovascular: Negative.   Gastrointestinal: Negative.   Endocrine: Negative.   Genitourinary: Negative.   Musculoskeletal: Positive for arthralgias, back pain, gait problem, myalgias and neck pain.  Skin: Negative.   Allergic/Immunologic: Negative.   Hematological: Negative.   Psychiatric/Behavioral: Negative.   All other systems reviewed and are negative.      Objective:   Physical Exam Vitals and nursing note reviewed.  Constitutional:      Appearance: Normal appearance. She is obese.  Cardiovascular:     Rate and Rhythm: Normal rate and regular rhythm.     Pulses: Normal pulses.     Heart sounds: Normal heart sounds.  Pulmonary:     Effort: Pulmonary effort is normal.     Breath sounds: Normal breath sounds.  Musculoskeletal:     Cervical back: Normal range of motion and neck supple.     Comments: Normal Muscle Bulk and Muscle Testing Reveals:  Upper Extremities: Full ROM and Muscle Strength 5/5 Thoracic Paraspinal Tenderness: T-7-T-9  Mainly Left Side  Lower Extremities: Decreased ROM and Muscle Strength 5/5 Bilateral Lower Extremities Flexion Produces Pain into her Bilateral Patella's R>L Arises from Table Slowly using cane for support Wide Based Gait   Skin:    General: Skin is warm and dry.  Neurological:     Mental Status: She is alert and oriented to person, place, and time.  Psychiatric:        Mood and  Affect: Mood normal.        Behavior: Behavior normal.           Assessment & Plan:  1.Chronic Bilateral Thoracic Back Pain/ Low Back Pain/ Lumbar Facet Arthropathy:RefilledMSIR15mg  one tablet every 6 hoursas needed #105andContinue withSlow weaning ofMorphine. Refilled:MS Contin 60 mg and 30 mg Q 12 hours to equal 90 mg #60.Continue Gabapentin and Pamelor.10/27/2019 We will continue the opioid monitoring program, this consists of regular clinic visits, examinations, urine drug screen, pill counts as well as use of [place] Controlled Substance Reporting system. A 12 month History has been reviewed on the [place] on 10/27/2019. 2. Degenerative Disk Disease:Encouraged Continue HEP as Tolertatedand heat therapy. Continue Current Medication Regime.10/27/2019. 3.Bilateral Greater Trochanteric Bursitis: Continuecurrent treatment regimenwith Heat and Ice Therapy.10/27/2019. 4. Osteoarthritis of Bilateral Knees: Continue current treatment modality with homeexerciseprogramand heat therapy.10/27/2019 5. Migraine Headaches:Continue withMigraine Journal. Aimovigdiscontinued due to Hives. ContinueLamictalandMaxalt. Continue to Monitor.10/27/2019 6. Smoking Cessation: She has quit smokingsince 08/25/2016. Continue to Monitor.10/27/2019 7.Constipation: ContinueCurrent medication regime withLinzess.10/27/2019 8. Muscle Spasm: Continuecurrent medication regime withTizanidine.10/27/2019 9. DepressionAnxiety/ Panic Attacks: Continuecurrent medication regimen and treatment modality.Celexa.Lamictaland Counseling with [name] andDr.Akhtar Psychiatrist.10/27/2019 10. RSD: Continue with current medication Regimewith Gabapentin.10/27/2019  48minutes of face to face patient care time was spent during this visit. All questions were encouraged and answered.  F/U in 1 month

## 2019-11-20 ENCOUNTER — Other Ambulatory Visit: Payer: Self-pay | Admitting: Registered Nurse

## 2019-11-21 ENCOUNTER — Other Ambulatory Visit: Payer: Self-pay

## 2019-11-21 ENCOUNTER — Encounter: Payer: Medicare Other | Attending: Physical Medicine and Rehabilitation | Admitting: Registered Nurse

## 2019-11-21 ENCOUNTER — Encounter: Payer: Self-pay | Admitting: Registered Nurse

## 2019-11-21 VITALS — BP 110/72 | HR 94 | Temp 98.8°F | Ht 68.0 in | Wt 254.0 lb

## 2019-11-21 DIAGNOSIS — M1712 Unilateral primary osteoarthritis, left knee: Secondary | ICD-10-CM

## 2019-11-21 DIAGNOSIS — M47816 Spondylosis without myelopathy or radiculopathy, lumbar region: Secondary | ICD-10-CM | POA: Diagnosis not present

## 2019-11-21 DIAGNOSIS — M1711 Unilateral primary osteoarthritis, right knee: Secondary | ICD-10-CM

## 2019-11-21 DIAGNOSIS — G43119 Migraine with aura, intractable, without status migrainosus: Secondary | ICD-10-CM

## 2019-11-21 DIAGNOSIS — Z79891 Long term (current) use of opiate analgesic: Secondary | ICD-10-CM | POA: Insufficient documentation

## 2019-11-21 DIAGNOSIS — G8929 Other chronic pain: Secondary | ICD-10-CM

## 2019-11-21 DIAGNOSIS — G894 Chronic pain syndrome: Secondary | ICD-10-CM

## 2019-11-21 DIAGNOSIS — F329 Major depressive disorder, single episode, unspecified: Secondary | ICD-10-CM | POA: Insufficient documentation

## 2019-11-21 DIAGNOSIS — Z79899 Other long term (current) drug therapy: Secondary | ICD-10-CM | POA: Diagnosis not present

## 2019-11-21 DIAGNOSIS — Z5181 Encounter for therapeutic drug level monitoring: Secondary | ICD-10-CM

## 2019-11-21 DIAGNOSIS — M546 Pain in thoracic spine: Secondary | ICD-10-CM | POA: Insufficient documentation

## 2019-11-21 MED ORDER — MORPHINE SULFATE ER 60 MG PO TBCR: 60.0000 mg | EXTENDED_RELEASE_TABLET | Freq: Two times a day (BID) | ORAL | 0 refills | Status: DC

## 2019-11-21 MED ORDER — MORPHINE SULFATE ER 30 MG PO TBCR: 30.0000 mg | EXTENDED_RELEASE_TABLET | Freq: Two times a day (BID) | ORAL | 0 refills | Status: DC

## 2019-11-21 MED ORDER — MORPHINE SULFATE 15 MG PO TABS: 15.0000 mg | ORAL_TABLET | Freq: Four times a day (QID) | ORAL | 0 refills | Status: DC | PRN

## 2019-11-21 NOTE — Progress Notes (Signed)
Subjective:    Patient ID: [name], female    DOB: 05-13-1960, 59 y.o.   MRN: [id number]  HPI: [name] is a 59 y.o. female who returns for follow up appointment for chronic pain and medication refill. She states her pain is located in her mid- lower back pain and bilateral knee pain R>L. Also reports she has a migraine for the last three days and compliant with her medication. She rates her  Pain 8. Her  current exercise regime is walking and performing stretching exercises.  Ms. Hiers Morphine equivalent is 232.50 MME.  Last Oral Swab was Performed on 09/29/2019, it was consistent for Morphine.    Pain Inventory Average Pain 8 Pain Right Now 8 My pain is constant, sharp, burning, dull, stabbing, tingling and aching  In the last 24 hours, has pain interfered with the following? General activity 10 Relation with others 10 Enjoyment of life 10 What TIME of day is your pain at its worst? morning , daytime, evening and night Sleep (in general) Poor  Pain is worse with: walking, bending, standing and some activites Pain improves with: rest, heat/ice, therapy/exercise, medication, TENS and injections Relief from Meds: 6  Family History  Problem Relation Age of Onset  . Depression Mother   . Hypertension Mother   . Hypertension Father   . Heart failure Father   . Diabetes Father   . Heart attack Father   . Colon cancer Neg Hx   . Esophageal cancer Neg Hx   . Stomach cancer Neg Hx   . Rectal cancer Neg Hx    Social History   Socioeconomic History  . Marital status: Married    Spouse name: [name]  . Number of children: 1  . Years of education: some college  . Highest education level: Not on file  Occupational History  . Occupation: On disability    Employer: UNEMPLOYED  Tobacco Use  . Smoking status: Former Smoker    Packs/day: 0.50    Types: Cigarettes    Quit date: 02/06/2016    Years since quitting: 3.7  . Smokeless tobacco: Never Used    . Tobacco comment: going to try e-sticks to quit  Substance and Sexual Activity  . Alcohol use: No    Alcohol/week: 0.0 standard drinks  . Drug use: No  . Sexual activity: Not Currently    Partners: Male  Other Topics Concern  . Not on file  Social History Narrative   8-10 cups of soda a day.  On disability. No regular exercise.     Social Determinants of Health   Financial Resource Strain:   . Difficulty of Paying Living Expenses: Not on file  Food Insecurity:   . Worried About Charity fundraiser in the Last Year: Not on file  . Ran Out of Food in the Last Year: Not on file  Transportation Needs:   . Lack of Transportation (Medical): Not on file  . Lack of Transportation (Non-Medical): Not on file  Physical Activity:   . Days of Exercise per Week: Not on file  . Minutes of Exercise per Session: Not on file  Stress:   . Feeling of Stress : Not on file  Social Connections:   . Frequency of Communication with Friends and Family: Not on file  . Frequency of Social Gatherings with Friends and Family: Not on file  . Attends Religious Services: Not on file  . Active Member of Clubs or Organizations: Not on file  .  Attends Archivist Meetings: Not on file  . Marital Status: Not on file   Past Surgical History:  Procedure Laterality Date  . ABDOMINAL HYSTERECTOMY  May 2004  . Basel Cell Carcinoma  06/2005, 07/2005   nasal tip and reconstruction  . BREAST CYST ASPIRATION    . CARPAL TUNNEL RELEASE  07/1993   both hands  . COLONOSCOPY    . HEMORROIDECTOMY  July 2003  . KNEE ARTHROPLASTY  09/2001, 05/2003   left knee, chondromalasia  . KNEE ARTHROSCOPY  jan 2005   Right knee, tisse release, chrondromalacia  . REPLACEMENT TOTAL KNEE  Nov 2005   Left   . THYROID LOBECTOMY  01/2005   malignant area removed from right lobe  . THYROID SURGERY     thyroid lobe removal  . TONSILLECTOMY  09/1972  . TUBAL LIGATION  Sept 1999  . Ulnar nerve entrapment  Feb 1995   left  elbow   Past Surgical History:  Procedure Laterality Date  . ABDOMINAL HYSTERECTOMY  May 2004  . Basel Cell Carcinoma  06/2005, 07/2005   nasal tip and reconstruction  . BREAST CYST ASPIRATION    . CARPAL TUNNEL RELEASE  07/1993   both hands  . COLONOSCOPY    . HEMORROIDECTOMY  July 2003  . KNEE ARTHROPLASTY  09/2001, 05/2003   left knee, chondromalasia  . KNEE ARTHROSCOPY  jan 2005   Right knee, tisse release, chrondromalacia  . REPLACEMENT TOTAL KNEE  Nov 2005   Left   . THYROID LOBECTOMY  01/2005   malignant area removed from right lobe  . THYROID SURGERY     thyroid lobe removal  . TONSILLECTOMY  09/1972  . TUBAL LIGATION  Sept 1999  . Ulnar nerve entrapment  Feb 1995   left elbow   Past Medical History:  Diagnosis Date  . Allergy   . Anxiety   . Cancer ([place])    cancerous nodules on thyroid  . Cancer ([place])    basal cell Cancer-nose  . Chronic pain syndrome   . COPD (chronic obstructive pulmonary disease) ([place])   . Degeneration of lumbar or lumbosacral intervertebral disc   . Depression   . Facet syndrome, lumbar   . GERD (gastroesophageal reflux disease)   . Hyperlipidemia   . Intervertebral lumbar disc disorder with myelopathy, lumbar region   . Lumbosacral spondylosis without myelopathy   . Migraine without aura, with intractable migraine, so stated, without mention of status migrainosus   . Primary localized osteoarthrosis, lower leg   . Thyroid disease    hypothyroid  . Unspecified musculoskeletal disorders and symptoms referable to neck    cervical/trapezius   BP (!) 110/7   Pulse 94   Temp 98.2 F (36.8 C)   Ht 5\' 8"  (1.727 m)   Wt 254 lb (115.2 kg)   SpO2 94%   BMI 38.62 kg/m   Opioid Risk Score:   Fall Risk Score:  `1  Depression screen PHQ 2/9  Depression screen [facility] 2/9 08/29/2019 08/26/2019 08/01/2019 06/05/2019 06/07/2018 01/28/2018 01/28/2018  Decreased Interest 0 2 3 3 3 3 3   Down, Depressed, Hopeless 0 2 3 3 3 3 3   PHQ - 2 Score 0 4 6 6 6 6 6    Altered sleeping - 3 - - - 3 3  Tired, decreased energy - 3 - - - 3 3  Change in appetite - 3 - - - 3 3  Feeling bad or failure about yourself  - 3 - - -  3 3  Trouble concentrating - 1 - - - 1 1  Moving slowly or fidgety/restless - 0 - - - 0 0  Suicidal thoughts - 0 - - - 2 2  PHQ-9 Score - 17 - - - 21 21  Difficult doing work/chores - Extremely dIfficult - - - Extremely dIfficult Extremely dIfficult  Some encounter information is confidential and restricted. Go to Review Flowsheets activity to see all data.  Some recent data might be hidden    Review of Systems  Constitutional: Negative.   HENT: Negative.   Eyes: Positive for photophobia and visual disturbance.  Cardiovascular: Negative.   Gastrointestinal: Positive for nausea and vomiting.  Genitourinary: Negative.   Musculoskeletal: Positive for arthralgias and gait problem.  Skin: Negative.   Allergic/Immunologic: Negative.   Neurological: Positive for headaches.  Hematological: Negative.   Psychiatric/Behavioral: Negative.   All other systems reviewed and are negative.      Objective:   Physical Exam Vitals and nursing note reviewed.  Constitutional:      Appearance: Normal appearance.  Cardiovascular:     Rate and Rhythm: Normal rate and regular rhythm.     Pulses: Normal pulses.     Heart sounds: Normal heart sounds.  Musculoskeletal:     Cervical back: Normal range of motion and neck supple.     Comments: Normal Muscle Bulk and Muscle Testing Reveals:  Upper Extremities: Full ROM and Muscle Strength 5/5 Thoracic Paraspinal Tenderness: T-7-T-9 Lumbar Paraspinal Tenderness: L-3-L-5 Lower Extremities: Full ROM and Muscle Strength 5/5 Arises from Table slowly using cane for support Antalgic  Gait   Skin:    General: Skin is warm and dry.  Neurological:     Mental Status: She is alert and oriented to person, place, and time.  Psychiatric:        Mood and Affect: Mood normal.        Behavior: Behavior normal.            Assessment & Plan:  1.Chronic Bilateral Thoracic Back Pain/ Low Back Pain/ Lumbar Facet Arthropathy:RefilledMSIR15mg  one tablet every 6 hoursas needed #105andContinue withSlow weaning ofMorphine. Refilled:MS Contin 60 mg and 30 mg Q 12 hours to equal 90 mg #60.Continue Gabapentin and Pamelor.11/21/2019 We will continue the opioid monitoring program, this consists of regular clinic visits, examinations, urine drug screen, pill counts as well as use of [place] Controlled Substance Reporting system. A 12 month History has been reviewed on the [place] Controlled Substance Reporting Systemon 11/21/2019. 2. Degenerative Disk Disease:Encouraged Continue HEP as Tolertatedand heat therapy. Continue Current Medication Regime.10/27/2019. 3.Bilateral Greater Trochanteric Bursitis: Continuecurrent treatment regimenwith Heat and Ice Therapy.11/21/2019. 4. Osteoarthritis of Bilateral Knees: Continue current treatment modality with homeexerciseprogramand heat therapy.11/21/2019 5. Migraine Headaches:Continue withMigraine Journal. Aimovigdiscontinued due to Hives. ContinueLamictalandMaxalt. Continue to Monitor.11/21/2019 6. Smoking Cessation: She has quit smokingsince 08/25/2016. Continue to Monitor.11/21/2019 7.Constipation: ContinueCurrent medication regime withLinzess.11/21/2019 8. Muscle Spasm: Continuecurrent medication regime withTizanidine.11/21/2019 9. DepressionAnxiety/ Panic Attacks: Continuecurrent medication regimen and treatment modality.Celexa.Lamictaland Counseling with [name] andDr.Akhtar Psychiatrist.11/21/2019 10. RSD: Continue with current medication Regimewith Gabapentin.11/21/2019  65minutes of face to face patient care time was spent during this visit. All questions were encouraged and answered.  F/U in 1 month

## 2019-11-24 ENCOUNTER — Ambulatory Visit: Payer: Medicare Other | Admitting: Registered Nurse

## 2019-12-11 ENCOUNTER — Ambulatory Visit (HOSPITAL_COMMUNITY): Payer: Medicare Other | Admitting: Licensed Clinical Social Worker

## 2019-12-11 NOTE — Progress Notes (Signed)
Therapist contacted patient by phone for session and left message. Based on past experiences it may be an connectivity issues.

## 2019-12-20 ENCOUNTER — Other Ambulatory Visit: Payer: Self-pay | Admitting: Registered Nurse

## 2019-12-22 ENCOUNTER — Encounter: Payer: Medicare Other | Attending: Physical Medicine and Rehabilitation | Admitting: Registered Nurse

## 2019-12-22 ENCOUNTER — Encounter: Payer: Self-pay | Admitting: Registered Nurse

## 2019-12-22 ENCOUNTER — Other Ambulatory Visit: Payer: Self-pay

## 2019-12-22 VITALS — BP 123/74 | HR 97 | Temp 98.1°F | Ht 68.0 in | Wt 244.4 lb

## 2019-12-22 DIAGNOSIS — F329 Major depressive disorder, single episode, unspecified: Secondary | ICD-10-CM | POA: Diagnosis present

## 2019-12-22 DIAGNOSIS — G8929 Other chronic pain: Secondary | ICD-10-CM | POA: Diagnosis not present

## 2019-12-22 DIAGNOSIS — M546 Pain in thoracic spine: Secondary | ICD-10-CM

## 2019-12-22 DIAGNOSIS — Z5181 Encounter for therapeutic drug level monitoring: Secondary | ICD-10-CM | POA: Diagnosis not present

## 2019-12-22 DIAGNOSIS — G43119 Migraine with aura, intractable, without status migrainosus: Secondary | ICD-10-CM | POA: Diagnosis not present

## 2019-12-22 DIAGNOSIS — G9050 Complex regional pain syndrome I, unspecified: Secondary | ICD-10-CM | POA: Diagnosis not present

## 2019-12-22 DIAGNOSIS — M47816 Spondylosis without myelopathy or radiculopathy, lumbar region: Secondary | ICD-10-CM | POA: Insufficient documentation

## 2019-12-22 DIAGNOSIS — M1711 Unilateral primary osteoarthritis, right knee: Secondary | ICD-10-CM | POA: Insufficient documentation

## 2019-12-22 DIAGNOSIS — M1712 Unilateral primary osteoarthritis, left knee: Secondary | ICD-10-CM | POA: Diagnosis not present

## 2019-12-22 DIAGNOSIS — Z79891 Long term (current) use of opiate analgesic: Secondary | ICD-10-CM | POA: Insufficient documentation

## 2019-12-22 DIAGNOSIS — Z79899 Other long term (current) drug therapy: Secondary | ICD-10-CM | POA: Diagnosis not present

## 2019-12-22 DIAGNOSIS — G894 Chronic pain syndrome: Secondary | ICD-10-CM | POA: Insufficient documentation

## 2019-12-22 MED ORDER — MORPHINE SULFATE ER 30 MG PO TBCR: 30.0000 mg | EXTENDED_RELEASE_TABLET | Freq: Two times a day (BID) | ORAL | 0 refills | Status: DC

## 2019-12-22 MED ORDER — MORPHINE SULFATE 15 MG PO TABS: 15.0000 mg | ORAL_TABLET | Freq: Four times a day (QID) | ORAL | 0 refills | Status: DC | PRN

## 2019-12-22 MED ORDER — MORPHINE SULFATE ER 60 MG PO TBCR: 60.0000 mg | EXTENDED_RELEASE_TABLET | Freq: Two times a day (BID) | ORAL | 0 refills | Status: DC

## 2019-12-22 MED ORDER — PROMETHAZINE HCL 12.5 MG PO TABS: ORAL_TABLET | ORAL | 2 refills | Status: DC

## 2019-12-22 NOTE — Progress Notes (Signed)
Subjective:    Patient ID: [name], female    DOB: [birth year], 59 y.o.   MRN: [id number]  HPI: [name] is a 59 y.o. female who returns for follow up appointment for chronic pain and medication refill. She states her  pain is located in her mid- back, lower back pain, bilateral hip pain and bilateral knee pain R>L. She rates her pain 8. Her current exercise regime is walking and performing stretching exercises.  [name] Morphine equivalent is 251.50 MME.  Oral Swab was Performed today.   Pain Inventory Average Pain 9 Pain Right Now 8 My pain is constant, sharp, burning, dull, stabbing, tingling and aching  In the last 24 hours, has pain interfered with the following? General activity 10 Relation with others 10 Enjoyment of life 10 What TIME of day is your pain at its worst? morning , daytime, evening and night Sleep (in general) Poor  Pain is worse with: walking, bending, standing and some activites Pain improves with: rest, heat/ice, therapy/exercise and medication Relief from Meds: 6  Family History  Problem Relation Age of Onset  . Depression Mother   . Hypertension Mother   . Hypertension Father   . Heart failure Father   . Diabetes Father   . Heart attack Father   . Colon cancer Neg Hx   . Esophageal cancer Neg Hx   . Stomach cancer Neg Hx   . Rectal cancer Neg Hx    Social History   Socioeconomic History  . Marital status: Married    Spouse name: [name]  . Number of children: 1  . Years of education: some college  . Highest education level: Not on file  Occupational History  . Occupation: On disability    Employer: UNEMPLOYED  Tobacco Use  . Smoking status: Former Smoker    Packs/day: 0.50    Types: Cigarettes    Quit date: 02/06/2016    Years since quitting: 3.8  . Smokeless tobacco: Never Used  . Tobacco comment: going to try e-sticks to quit  Substance and Sexual Activity  . Alcohol use: No    Alcohol/week: 0.0  standard drinks  . Drug use: No  . Sexual activity: Not Currently    Partners: Male  Other Topics Concern  . Not on file  Social History Narrative   8-10 cups of soda a day.  On disability. No regular exercise.     Social Determinants of Health   Financial Resource Strain:   . Difficulty of Paying Living Expenses: Not on file  Food Insecurity:   . Worried About Charity fundraiser in the Last Year: Not on file  . Ran Out of Food in the Last Year: Not on file  Transportation Needs:   . Lack of Transportation (Medical): Not on file  . Lack of Transportation (Non-Medical): Not on file  Physical Activity:   . Days of Exercise per Week: Not on file  . Minutes of Exercise per Session: Not on file  Stress:   . Feeling of Stress : Not on file  Social Connections:   . Frequency of Communication with Friends and Family: Not on file  . Frequency of Social Gatherings with Friends and Family: Not on file  . Attends Religious Services: Not on file  . Active Member of Clubs or Organizations: Not on file  . Attends Archivist Meetings: Not on file  . Marital Status: Not on file   Past Surgical History:  Procedure  Laterality Date  . ABDOMINAL HYSTERECTOMY  May 2004  . Basel Cell Carcinoma  06/2005, 07/2005   nasal tip and reconstruction  . BREAST CYST ASPIRATION    . CARPAL TUNNEL RELEASE  07/1993   both hands  . COLONOSCOPY    . HEMORROIDECTOMY  July 2003  . KNEE ARTHROPLASTY  09/2001, 05/2003   left knee, chondromalasia  . KNEE ARTHROSCOPY  jan 2005   Right knee, tisse release, chrondromalacia  . REPLACEMENT TOTAL KNEE  Nov 2005   Left   . THYROID LOBECTOMY  01/2005   malignant area removed from right lobe  . THYROID SURGERY     thyroid lobe removal  . TONSILLECTOMY  09/1972  . TUBAL LIGATION  Sept 1999  . Ulnar nerve entrapment  Feb 1995   left elbow   Past Surgical History:  Procedure Laterality Date  . ABDOMINAL HYSTERECTOMY  May 2004  . Basel Cell Carcinoma   06/2005, 07/2005   nasal tip and reconstruction  . BREAST CYST ASPIRATION    . CARPAL TUNNEL RELEASE  07/1993   both hands  . COLONOSCOPY    . HEMORROIDECTOMY  July 2003  . KNEE ARTHROPLASTY  09/2001, 05/2003   left knee, chondromalasia  . KNEE ARTHROSCOPY  jan 2005   Right knee, tisse release, chrondromalacia  . REPLACEMENT TOTAL KNEE  Nov 2005   Left   . THYROID LOBECTOMY  01/2005   malignant area removed from right lobe  . THYROID SURGERY     thyroid lobe removal  . TONSILLECTOMY  09/1972  . TUBAL LIGATION  Sept 1999  . Ulnar nerve entrapment  Feb 1995   left elbow   Past Medical History:  Diagnosis Date  . Allergy   . Anxiety   . Cancer ([place])    cancerous nodules on thyroid  . Cancer ([place])    basal cell Cancer-nose  . Chronic pain syndrome   . COPD (chronic obstructive pulmonary disease) ([place])   . Degeneration of lumbar or lumbosacral intervertebral disc   . Depression   . Facet syndrome, lumbar   . GERD (gastroesophageal reflux disease)   . Hyperlipidemia   . Intervertebral lumbar disc disorder with myelopathy, lumbar region   . Lumbosacral spondylosis without myelopathy   . Migraine without aura, with intractable migraine, so stated, without mention of status migrainosus   . Primary localized osteoarthrosis, lower leg   . Thyroid disease    hypothyroid  . Unspecified musculoskeletal disorders and symptoms referable to neck    cervical/trapezius   BP 123/74   Pulse 97   Temp 98.1 F (36.7 C)   Ht 5\' 8"  (1.727 m)   Wt 244 lb 6.4 oz (110.9 kg)   SpO2 93%   BMI 37.16 kg/m   Opioid Risk Score:   Fall Risk Score:  `1  Depression screen PHQ 2/9  Depression screen [facility] 2/9 12/22/2019 08/29/2019 08/26/2019 08/01/2019 06/05/2019 06/07/2018 01/28/2018  Decreased Interest 2 0 2 3 3 3 3   Down, Depressed, Hopeless 2 0 2 3 3 3 3   PHQ - 2 Score 4 0 4 6 6 6 6   Altered sleeping - - 3 - - - 3  Tired, decreased energy - - 3 - - - 3  Change in appetite - - 3 - - - 3  Feeling  bad or failure about yourself  - - 3 - - - 3  Trouble concentrating - - 1 - - - 1  Moving slowly or fidgety/restless - -  0 - - - 0  Suicidal thoughts - - 0 - - - 2  PHQ-9 Score - - 17 - - - 21  Difficult doing work/chores - - Extremely dIfficult - - - Extremely dIfficult  Some encounter information is confidential and restricted. Go to Review Flowsheets activity to see all data.  Some recent data might be hidden    Review of Systems  Constitutional: Negative.   HENT: Negative.   Eyes: Negative.   Respiratory: Negative.   Cardiovascular: Negative.   Gastrointestinal: Negative.   Endocrine: Negative.   Genitourinary: Negative.   Musculoskeletal: Positive for back pain.  Skin: Negative.   Allergic/Immunologic: Negative.   Neurological: Negative.   Hematological: Negative.   Psychiatric/Behavioral: Positive for dysphoric mood.  All other systems reviewed and are negative.      Objective:   Physical Exam Vitals and nursing note reviewed.  Constitutional:      Appearance: Normal appearance.  Cardiovascular:     Rate and Rhythm: Normal rate and regular rhythm.     Pulses: Normal pulses.     Heart sounds: Normal heart sounds.  Pulmonary:     Effort: Pulmonary effort is normal.     Breath sounds: Normal breath sounds.  Musculoskeletal:     Cervical back: Normal range of motion and neck supple.     Comments: Normal Muscle Bulk and Muscle Testing Reveals: Upper Extremities: Full ROM and Muscle Strength 5/5 Thoracic Paraspinal Tenderness: T-3-T-7  Lumbar Paraspinal Tenderness: L-4-L-5 Bilateral Greater Trochanter Tenderness Lower Extremities: Full ROM and Muscle Strength 5/5 Bilateral Lower Extremities Flexion Produces Pain into her Bilateral Patella's Arises from Table Slowly using cane for support Antalgic Gait   Skin:    General: Skin is warm and dry.  Neurological:     Mental Status: She is alert and oriented to person, place, and time.  Psychiatric:        Mood and  Affect: Mood normal.        Behavior: Behavior normal.           Assessment & Plan:  1.Chronic Bilateral Thoracic Back Pain/ Low Back Pain/ Lumbar Facet Arthropathy:RefilledMSIR15mg  one tablet every 6 hoursas needed #105andContinue withSlow weaning ofMorphine. Refilled:MS Contin 60 mg and 30 mg Q 12 hours to equal 90 mg #60.Continue Gabapentin and Pamelor.12/22/2019 We will continue the opioid monitoring program, this consists of regular clinic visits, examinations, urine drug screen, pill counts as well as use of [place] Controlled Substance Reporting system. A 12 month History has been reviewed on the [place] Controlled Substance Reporting Systemon 12/22/2019. 2. Degenerative Disk Disease:Encouraged Continue HEP as Tolertatedand heat therapy. Continue Current Medication Regime.12/22/2019. 3.Bilateral Greater Trochanteric Bursitis: Continuecurrent treatment regimenwith Heat and Ice Therapy.12/22/2019. 4. Osteoarthritis of Bilateral Knees: Continue current treatment modality with homeexerciseprogramand heat therapy.12/22/2019 5. Migraine Headaches:Continue withMigraine Journal. Aimovigdiscontinued due to Hives. ContinueLamictalandMaxalt. Continue to Monitor.12/22/2019 6. Smoking Cessation: She has quit smokingsince 08/25/2016. Continue to Monitor.12/22/2019 7.Constipation: ContinueCurrent medication regime withLinzess.12/22/2019 8. Muscle Spasm: Continuecurrent medication regime withTizanidine.12/22/2019 9. DepressionAnxiety/ Panic Attacks: Continuecurrent medication regimen and treatment modality.Celexa.Lamictaland Counseling with [name] andDr.Akhtar Psychiatrist.12/22/2019 10. RSD: Continue with current medication Regimewith Gabapentin.12/22/2019  38minutes of face to face patient care time was spent during this visit. All questions were encouraged and answered.  F/U in 1 month

## 2019-12-25 ENCOUNTER — Ambulatory Visit (INDEPENDENT_AMBULATORY_CARE_PROVIDER_SITE_OTHER): Payer: Medicare Other | Admitting: Licensed Clinical Social Worker

## 2019-12-25 DIAGNOSIS — F331 Major depressive disorder, recurrent, moderate: Secondary | ICD-10-CM | POA: Diagnosis not present

## 2019-12-25 DIAGNOSIS — G894 Chronic pain syndrome: Secondary | ICD-10-CM | POA: Diagnosis not present

## 2019-12-25 DIAGNOSIS — M5136 Other intervertebral disc degeneration, lumbar region: Secondary | ICD-10-CM | POA: Diagnosis not present

## 2019-12-25 DIAGNOSIS — F411 Generalized anxiety disorder: Secondary | ICD-10-CM | POA: Diagnosis not present

## 2019-12-25 NOTE — Progress Notes (Signed)
Virtual Visit via Telephone Note  I connected with [name] on 12/25/19 at  8:00 AM EST by telephone and verified that I am speaking with the correct person using two identifiers.  Location: Patient: home Provider: office   I discussed the limitations, risks, security and privacy concerns of performing an evaluation and management service by telephone and the availability of in person appointments. I also discussed with the patient that there may be a patient responsible charge related to this service. The patient expressed understanding and agreed to proceed.   I discussed the assessment and treatment plan with the patient. The patient was provided an opportunity to ask questions and all were answered. The patient agreed with the plan and demonstrated an understanding of the instructions.   The patient was advised to call back or seek an in-person evaluation if the symptoms worsen or if the condition fails to improve as anticipated.  I provided 52 minutes of non-face-to-face time during this encounter.   THERAPIST PROGRESS NOTE  Session Time: 8:00 AM to 8:52 AM  Participation Level: Active  Behavioral Response: CasualAlertDysphoric  Type of Therapy: Individual Therapy  Treatment Goals addressed:  Decrease in depression, anxiety and panic  Interventions: Solution Focused, Strength-based, Supportive, Reframing and Other: coping  Summary: [name] is a 59 y.o. female who presents with has been staying in bed still waiting for armchair to come. She got new dentures with money from Mom. Talked about playing [employer] and winning small amount of money. Patient said it is fun thing for her to do. Will get free things from them. Talked about her grandchild and started a savings account for her. Will use some of inheritance to fix the house. Discussed leaving behind a good legacy and patient agrees although we talked about her Dad and she didn't think  it would be like that. Brother was acting jerky, rude. Patient lost it. Mom took his side. Mom told her to go and commit suicide. Didn't speak to her over a year and that was a first. Patient didn't know but her [name] was trying to stay out of it but with him staying out of it didn't get to talk to him either. Usually on her side but this time didn't. He had a heart attack in nursing home recovering. Only got to talk to him one time before he died. He had a sudden heart attack. Wonders if daughter is angry with her about that. Therapist questions there is nothing patient did. Daughter angry made [name] cry. Wonders if it was the stress of it all that hurt her father. Patient was the closest one to him. Didn't see him for a year. It all happened around Christmas. Every year brings it back to mind, so won't have another party around the holidays. Therapist questioned patient about feeling guilty not seeing patient did anything wrong. Talked about plans for Thanksgiving to [employer] but patient and husband can't do it with their backs. It is the day before their wedding anniversary. It is their 20th anniversary. Due for a migraine and hopes it comes before the holiday or anniversary. Checked in for today and ok. Kind of down for last few weeks nothing specific. Allergies have been horrible. Went to pain doctor this past Monday and the trees are gorgeous.        Reviewed session and patient says always feels better after talking to therapist Suicidal/Homicidal: No  Therapist Response: Therapist reviewed symptoms, facilitated expression of thoughts and  feelings, assess helpful to focus on things patient enjoys that help her cope particularly as she is isolated at home a lot, also positive aspects of her life that right now including she has been able to buy because of getting her inheritance.  Processed through feelings related to guilt feelings when father died therapist guided patient in recognizing that her loved ones  pass and that is out of our hands additionally reviewed episode that led to distancing of parents sharing therapist opinion that she did nothing wrong, validating patient how she responded to her brother who had been reviewed at her house.  Therapist provided active listening open questions supportive interventions  Plan: Return again in 4 weeks.2.Therapist continue to use strength-based and supportive interventions well processing patient's feelings helping her with coping and stressors  Diagnosis: Axis I:  major depressive disorder, recurrent, moderate, degenerative disc disease, chronic pain syndrome, generalized anxiety disorder    Axis II: No diagnosis    [name], LCSW 12/25/2019

## 2019-12-26 LAB — DRUG TOX MONITOR 1 W/CONF, ORAL FLD

## 2019-12-26 LAB — DRUG TOX ALC METAB W/CON, ORAL FLD: Alcohol Metabolite: NEGATIVE ng/mL (ref <25)

## 2020-01-08 ENCOUNTER — Other Ambulatory Visit: Payer: Self-pay | Admitting: Family Medicine

## 2020-01-21 ENCOUNTER — Encounter: Payer: Medicare Other | Attending: Physical Medicine and Rehabilitation | Admitting: Registered Nurse

## 2020-01-21 ENCOUNTER — Encounter: Payer: Self-pay | Admitting: Registered Nurse

## 2020-01-21 ENCOUNTER — Other Ambulatory Visit: Payer: Self-pay

## 2020-01-21 VITALS — BP 112/79 | HR 98 | Temp 98.9°F | Ht 68.0 in | Wt 241.4 lb

## 2020-01-21 DIAGNOSIS — M1712 Unilateral primary osteoarthritis, left knee: Secondary | ICD-10-CM | POA: Insufficient documentation

## 2020-01-21 DIAGNOSIS — F329 Major depressive disorder, single episode, unspecified: Secondary | ICD-10-CM | POA: Diagnosis present

## 2020-01-21 DIAGNOSIS — G43119 Migraine with aura, intractable, without status migrainosus: Secondary | ICD-10-CM

## 2020-01-21 DIAGNOSIS — M1711 Unilateral primary osteoarthritis, right knee: Secondary | ICD-10-CM | POA: Diagnosis not present

## 2020-01-21 DIAGNOSIS — G9050 Complex regional pain syndrome I, unspecified: Secondary | ICD-10-CM | POA: Insufficient documentation

## 2020-01-21 DIAGNOSIS — G894 Chronic pain syndrome: Secondary | ICD-10-CM | POA: Diagnosis not present

## 2020-01-21 DIAGNOSIS — M47816 Spondylosis without myelopathy or radiculopathy, lumbar region: Secondary | ICD-10-CM

## 2020-01-21 DIAGNOSIS — M546 Pain in thoracic spine: Secondary | ICD-10-CM

## 2020-01-21 DIAGNOSIS — M5416 Radiculopathy, lumbar region: Secondary | ICD-10-CM

## 2020-01-21 DIAGNOSIS — Z5181 Encounter for therapeutic drug level monitoring: Secondary | ICD-10-CM | POA: Insufficient documentation

## 2020-01-21 DIAGNOSIS — Z79891 Long term (current) use of opiate analgesic: Secondary | ICD-10-CM

## 2020-01-21 DIAGNOSIS — Z79899 Other long term (current) drug therapy: Secondary | ICD-10-CM | POA: Diagnosis not present

## 2020-01-21 DIAGNOSIS — G8929 Other chronic pain: Secondary | ICD-10-CM

## 2020-01-21 MED ORDER — NORTRIPTYLINE HCL 50 MG PO CAPS: 100.0000 mg | ORAL_CAPSULE | Freq: Every day | ORAL | 5 refills | Status: DC

## 2020-01-21 MED ORDER — MORPHINE SULFATE 15 MG PO TABS: 15.0000 mg | ORAL_TABLET | Freq: Four times a day (QID) | ORAL | 0 refills | Status: DC | PRN

## 2020-01-21 MED ORDER — MORPHINE SULFATE ER 60 MG PO TBCR: 60.0000 mg | EXTENDED_RELEASE_TABLET | Freq: Two times a day (BID) | ORAL | 0 refills | Status: DC

## 2020-01-21 MED ORDER — MORPHINE SULFATE ER 30 MG PO TBCR: 30.0000 mg | EXTENDED_RELEASE_TABLET | Freq: Two times a day (BID) | ORAL | 0 refills | Status: DC

## 2020-01-21 MED ORDER — GABAPENTIN 800 MG PO TABS: ORAL_TABLET | ORAL | 5 refills | Status: DC

## 2020-01-21 NOTE — Progress Notes (Signed)
Subjective:    Patient ID: [name], female    DOB: Sep 10, 1960, 59 y.o.   MRN: [id number]  HPI: [name] is a 59 y.o. female who returns for follow up appointment for chronic pain and medication refill. She states her pain is located in her mid- lower back radiating into her left hip and right knee pain. She rates her pain 8. Her current exercise regime is walking and performing stretching exercises.  [name] reports two days ago she was standing in her kitchen, when it felt like she didn't have any control her neck and her head  fell forward, she believes it only lasted for a few  minutes. She states she went to sit down and rested  And it was resolved. She didn't seek medical attention. The above haven't happen again, she was instructed to call office or PCP if this occurs again, she verbalizes understandiung.   [name] Morphine equivalent is 244.50 MME.   Last Oral Swab was Performed on 12/22/2019, it was consistent.    Pain Inventory Average Pain 8 Pain Right Now 8 My pain is constant, sharp, burning, dull, stabbing, tingling and aching  In the last 24 hours, has pain interfered with the following? General activity 10 Relation with others 10 Enjoyment of life 10 What TIME of day is your pain at its worst? varies Sleep (in general) Poor  Pain is worse with: walking, bending, standing and some activites Pain improves with: rest, heat/ice, therapy/exercise, medication, TENS and injections Relief from Meds: 6  Family History  Problem Relation Age of Onset  . Depression Mother   . Hypertension Mother   . Hypertension Father   . Heart failure Father   . Diabetes Father   . Heart attack Father   . Colon cancer Neg Hx   . Esophageal cancer Neg Hx   . Stomach cancer Neg Hx   . Rectal cancer Neg Hx    Social History   Socioeconomic History  . Marital status: Married    Spouse name: [name]  . Number of children: 1  . Years of education:  some college  . Highest education level: Not on file  Occupational History  . Occupation: On disability    Employer: UNEMPLOYED  Tobacco Use  . Smoking status: Former Smoker    Packs/day: 0.50    Types: Cigarettes    Quit date: 02/06/2016    Years since quitting: 3.9  . Smokeless tobacco: Never Used  . Tobacco comment: going to try e-sticks to quit  Substance and Sexual Activity  . Alcohol use: No    Alcohol/week: 0.0 standard drinks  . Drug use: No  . Sexual activity: Not Currently    Partners: Male  Other Topics Concern  . Not on file  Social History Narrative   8-10 cups of soda a day.  On disability. No regular exercise.     Social Determinants of Health   Financial Resource Strain: Not on file  Food Insecurity: Not on file  Transportation Needs: Not on file  Physical Activity: Not on file  Stress: Not on file  Social Connections: Not on file   Past Surgical History:  Procedure Laterality Date  . ABDOMINAL HYSTERECTOMY  May 2004  . Basel Cell Carcinoma  06/2005, 07/2005   nasal tip and reconstruction  . BREAST CYST ASPIRATION    . CARPAL TUNNEL RELEASE  07/1993   both hands  . COLONOSCOPY    . HEMORROIDECTOMY  July 2003  .  KNEE ARTHROPLASTY  09/2001, 05/2003   left knee, chondromalasia  . KNEE ARTHROSCOPY  jan 2005   Right knee, tisse release, chrondromalacia  . REPLACEMENT TOTAL KNEE  Nov 2005   Left   . THYROID LOBECTOMY  01/2005   malignant area removed from right lobe  . THYROID SURGERY     thyroid lobe removal  . TONSILLECTOMY  09/1972  . TUBAL LIGATION  Sept 1999  . Ulnar nerve entrapment  Feb 1995   left elbow   Past Surgical History:  Procedure Laterality Date  . ABDOMINAL HYSTERECTOMY  May 2004  . Basel Cell Carcinoma  06/2005, 07/2005   nasal tip and reconstruction  . BREAST CYST ASPIRATION    . CARPAL TUNNEL RELEASE  07/1993   both hands  . COLONOSCOPY    . HEMORROIDECTOMY  July 2003  . KNEE ARTHROPLASTY  09/2001, 05/2003   left knee,  chondromalasia  . KNEE ARTHROSCOPY  jan 2005   Right knee, tisse release, chrondromalacia  . REPLACEMENT TOTAL KNEE  Nov 2005   Left   . THYROID LOBECTOMY  01/2005   malignant area removed from right lobe  . THYROID SURGERY     thyroid lobe removal  . TONSILLECTOMY  09/1972  . TUBAL LIGATION  Sept 1999  . Ulnar nerve entrapment  Feb 1995   left elbow   Past Medical History:  Diagnosis Date  . Allergy   . Anxiety   . Cancer ([place])    cancerous nodules on thyroid  . Cancer ([place])    basal cell Cancer-nose  . Chronic pain syndrome   . COPD (chronic obstructive pulmonary disease) ([place])   . Degeneration of lumbar or lumbosacral intervertebral disc   . Depression   . Facet syndrome, lumbar   . GERD (gastroesophageal reflux disease)   . Hyperlipidemia   . Intervertebral lumbar disc disorder with myelopathy, lumbar region   . Lumbosacral spondylosis without myelopathy   . Migraine without aura, with intractable migraine, so stated, without mention of status migrainosus   . Primary localized osteoarthrosis, lower leg   . Thyroid disease    hypothyroid  . Unspecified musculoskeletal disorders and symptoms referable to neck    cervical/trapezius   BP 112/79   Pulse 98   Temp 98.9 F (37.2 C)   Ht 5\' 8"  (1.727 m)   Wt 241 lb 6.4 oz (109.5 kg)   SpO2 93%   BMI 36.70 kg/m   Opioid Risk Score:   Fall Risk Score:  `1  Depression screen PHQ 2/9  Depression screen [facility] 2/9 01/21/2020 12/22/2019 08/29/2019 08/26/2019 08/01/2019 06/05/2019 06/07/2018  Decreased Interest 1 2 0 2 3 3 3   Down, Depressed, Hopeless 1 2 0 2 3 3 3   PHQ - 2 Score 2 4 0 4 6 6 6   Altered sleeping - - - 3 - - -  Tired, decreased energy - - - 3 - - -  Change in appetite - - - 3 - - -  Feeling bad or failure about yourself  - - - 3 - - -  Trouble concentrating - - - 1 - - -  Moving slowly or fidgety/restless - - - 0 - - -  Suicidal thoughts - - - 0 - - -  PHQ-9 Score - - - 17 - - -  Difficult doing work/chores  - - - Extremely dIfficult - - -  Some encounter information is confidential and restricted. Go to Review Flowsheets activity to see all data.  Some recent data might be hidden    Review of Systems  Constitutional: Negative.   HENT: Negative.   Eyes: Negative.   Respiratory: Negative.   Cardiovascular: Negative.   Gastrointestinal: Negative.   Endocrine: Negative.   Genitourinary: Negative.   Musculoskeletal: Positive for arthralgias, back pain and gait problem.  Skin: Negative.   Allergic/Immunologic: Negative.   Hematological: Negative.   Psychiatric/Behavioral: Positive for dysphoric mood.  All other systems reviewed and are negative.      Objective:   Physical Exam Vitals and nursing note reviewed.  Constitutional:      Appearance: Normal appearance. She is obese.  Cardiovascular:     Rate and Rhythm: Normal rate and regular rhythm.     Pulses: Normal pulses.     Heart sounds: Normal heart sounds.  Pulmonary:     Effort: Pulmonary effort is normal.     Breath sounds: Normal breath sounds.  Musculoskeletal:     Cervical back: Normal range of motion and neck supple.     Comments: Normal Muscle Bulk and Muscle Testing Reveals:  Upper Extremities: Full ROM and Muscle Strength 5/5 Thoracic Paraspinal Tenderness: T-7-T-9 Lumbar Paraspinal Tenderness: L-4-L-5 Lower Extremities: Decreased ROM and Muscle Strength 5/5 Right Lower Extremity Flexion Produces Pain into her right Patella Arises from Table slowly using cane for support Antalgic  Gait   Skin:    General: Skin is warm and dry.  Neurological:     Mental Status: She is alert and oriented to person, place, and time.  Psychiatric:        Mood and Affect: Mood normal.        Behavior: Behavior normal.           Assessment & Plan:  1.Chronic Bilateral Thoracic Back Pain/ Low Back Pain/ Lumbar Facet Arthropathy:RefilledMSIR15mg  one tablet every 6 hoursas needed #105andContinue withSlow weaning  ofMorphine. Refilled:MS Contin 60 mg and 30 mg Q 12 hours to equal 90 mg #60.Continue Gabapentin and Pamelor.01/21/2020 We will continue the opioid monitoring program, this consists of regular clinic visits, examinations, urine drug screen, pill counts as well as use of [place] Controlled Substance Reporting system. A 12 month History has been reviewed on the [place] Controlled Substance Reporting Systemon12/15/2021. 2. Degenerative Disk Disease:Encouraged Continue HEP as Tolertatedand heat therapy. Continue Current Medication Regime.01/21/2020. 3.Bilateral Greater Trochanteric Bursitis: Continuecurrent treatment regimenwith Heat and Ice Therapy.01/21/2020. 4. Osteoarthritis of Bilateral Knees: Continue current treatment modality with homeexerciseprogramand heat therapy.01/21/2020 5. Migraine Headaches:Continue withMigraine Journal. Aimovigdiscontinued due to Hives. ContinueLamictalandMaxalt. Continue to Monitor.01/21/2020 6. Smoking Cessation: She has quit smokingsince 08/25/2016. Continue to Monitor.01/21/2020 7.Constipation: ContinueCurrent medication regime withLinzess.01/21/2020 8. Muscle Spasm: Continuecurrent medication regime withTizanidine.01/21/2020 9. DepressionAnxiety/ Panic Attacks: Continuecurrent medication regimen and treatment modality.Celexa.Lamictaland Counseling with [name] andDr.Akhtar Psychiatrist.01/21/2020 10. RSD: Continue with current medication Regimewith Gabapentin.01/21/2020  F/U in 1 month

## 2020-01-22 ENCOUNTER — Other Ambulatory Visit: Payer: Self-pay

## 2020-01-22 ENCOUNTER — Ambulatory Visit (INDEPENDENT_AMBULATORY_CARE_PROVIDER_SITE_OTHER): Payer: Medicare Other | Admitting: Licensed Clinical Social Worker

## 2020-01-22 DIAGNOSIS — G894 Chronic pain syndrome: Secondary | ICD-10-CM | POA: Diagnosis not present

## 2020-01-22 DIAGNOSIS — F331 Major depressive disorder, recurrent, moderate: Secondary | ICD-10-CM | POA: Diagnosis not present

## 2020-01-22 DIAGNOSIS — M5136 Other intervertebral disc degeneration, lumbar region: Secondary | ICD-10-CM

## 2020-01-22 DIAGNOSIS — B351 Tinea unguium: Secondary | ICD-10-CM

## 2020-01-22 DIAGNOSIS — F411 Generalized anxiety disorder: Secondary | ICD-10-CM

## 2020-01-22 MED ORDER — CICLOPIROX 8 % EX SOLN: Freq: Every day | CUTANEOUS | 3 refills | Status: DC

## 2020-01-22 NOTE — Progress Notes (Signed)
Virtual Visit via Telephone Note  I connected with [name] on 01/22/20 at  8:00 AM EST by telephone and verified that I am speaking with the correct person using two identifiers.  Location: Patient: home Provider: home office   I discussed the limitations, risks, security and privacy concerns of performing an evaluation and management service by telephone and the availability of in person appointments. I also discussed with the patient that there may be a patient responsible charge related to this service. The patient expressed understanding and agreed to proceed.   I discussed the assessment and treatment plan with the patient. The patient was provided an opportunity to ask questions and all were answered. The patient agreed with the plan and demonstrated an understanding of the instructions.   The patient was advised to call back or seek an in-person evaluation if the symptoms worsen or if the condition fails to improve as anticipated.  I provided 54 minutes of non-face-to-face time during this encounter.  THERAPIST PROGRESS NOTE  Session Time: 8:00 AM to 8:54 AM  Participation Level: Active  Behavioral Response: CasualAlertAnxious and Dysphoric  Type of Therapy: Individual Therapy  Treatment Goals addressed:  Decrease in depression, anxiety and panic  Interventions: Solution Focused, Strength-based, Supportive and Other: coping  Summary: [name] is a 59 y.o. female who presents with hurting all over "but will live". When to doctor's and sore all over. "Almost more pain than worth" all the walking not used to it and hurts her back. Come home, go to bed, barely walk and hard to get going in the morning. They are going to take her down as far as they can not sure when next jump but already dreading it. Already taking down 15, immediate release. From 100"s to 90's with extended release. Already using ice pack on knees and heating pack on back every day. the 15's  immediate release don't do anything anymore. Before was going to ask to increase. So many drug allergies so not much she can have opiates one of the things doesn't cause her to break out in hives and have hair come out.  Last time decrease was 1st of last year. Body supposed to remember to do things, forget when  the medicine has been doing for you. They want her to do other stuff like heating pad, aspirin, tylenol to help with pain. Then there is concern of liver damage and don't want that. Very scared of decrease and doesn't know what to do. Ice packs on knee for 4-5 hours and heating on back almost all the time. If on feet for more that 5 minutes has to sit down "back screaming". "You would proud of me I have been losing weight." Been up to 276 now 241. Still going down and that is all that matters. It has been three months. Reviewed how she is doing it. When she gets hungry have been eating a tootsie roll pop. It takes the edge off. Also Smarties candy. Teeth still come out and aggravating because her heart was set on the dentures staying in wanted a dental implant but has bone loss so can't have it. Going to sister's for Christmas. There is [name], [name] her daughter and boyfriend, [name] and [name], patient and husband and [name].  Was not there last year so have lots of presents for them.     Suicidal/Homicidal: No  Therapist Response: Therapist reviewed symptoms, facilitated expression of thoughts and feelings.  Utilize expression of feelings  for management of stressors such as pain issues, plan of care to decrease pain meds.  Reviewed challenges of trying to manage the pain with alternative type of strategies.  Reviewed frustration of having dentures do not stay intact, validating patient on her frustration with this.  Noted very positive news patient losing weight significant as could have good impact on her mood and feeling good about herself.  Focused on some festivities for patient to  look forward to as also mood enhancing.  Therapist provided active listening open questions, supportive interventions  Plan: Return again in 2 weeks.2.herapist continue to use strength-based and supportive interventions well processing patient's feelings helping her with coping and stressors  Diagnosis: Axis I:  major depressive disorder, recurrent, moderate, degenerative disc disease, chronic pain syndrome, generalized anxiety disorder    Axis II: No diagnosis    [name], LCSW 01/22/2020

## 2020-01-22 NOTE — Telephone Encounter (Signed)
[name] is wanting a refill on [place]. She states she stopped using the solution and the fungus came back worse than before.

## 2020-02-05 ENCOUNTER — Ambulatory Visit (INDEPENDENT_AMBULATORY_CARE_PROVIDER_SITE_OTHER): Payer: Medicare Other | Admitting: Licensed Clinical Social Worker

## 2020-02-05 DIAGNOSIS — F331 Major depressive disorder, recurrent, moderate: Secondary | ICD-10-CM

## 2020-02-05 DIAGNOSIS — M5136 Other intervertebral disc degeneration, lumbar region: Secondary | ICD-10-CM

## 2020-02-05 DIAGNOSIS — F411 Generalized anxiety disorder: Secondary | ICD-10-CM

## 2020-02-05 DIAGNOSIS — G894 Chronic pain syndrome: Secondary | ICD-10-CM | POA: Diagnosis not present

## 2020-02-05 NOTE — Progress Notes (Signed)
Virtual Visit via Telephone Note  I connected with [name] on 02/05/20 at  8:00 AM EST by telephone and verified that I am speaking with the correct person using two identifiers.  Location: Patient: home Provider: home office   I discussed the limitations, risks, security and privacy concerns of performing an evaluation and management service by telephone and the availability of in person appointments. I also discussed with the patient that there may be a patient responsible charge related to this service. The patient expressed understanding and agreed to proceed.   I discussed the assessment and treatment plan with the patient. The patient was provided an opportunity to ask questions and all were answered. The patient agreed with the plan and demonstrated an understanding of the instructions.   The patient was advised to call back or seek an in-person evaluation if the symptoms worsen or if the condition fails to improve as anticipated.  I provided 52 minutes of non-face-to-face time during this encounter.  THERAPIST PROGRESS NOTE  Session Time: 8:00 AM to 8:52 AM  Participation Level: Active  Behavioral Response: CasualAlertDysphoric  Type of Therapy: Individual Therapy  Treatment Goals addressed:  Decrease in depression, anxiety, stress management, coping Interventions: Motivational Interviewing, Strength-based, Supportive and Other: coping  Summary: [name] is a 59 y.o. female who presents with talking about what she did for Christmas. Hurt a lot this week Christmas did her in, took two days to get the swelling down. More activity around Christmas time. Lost 35 lbs in last 3 months, makes her feel good. Hard to lose weight when can't move around. Knocked down portion sizes, also snacks to help with appetite. Helps her to curb her appetite. Discussed looking at picture of herself and looking just like her mom and discussed ways she is like her. Inherited  things from her such as being artistic, smart, things came easy for patient. Patient and [name] got the worst of it with mom. Closest to [facility] shared a room until 59 y.o. they were 13 months apart. Loves Lord of the Rings and reads it every year. Reached out to [name] twice before Christmas and hurts a lot when she doesn't hear from her. Stopped reaching out and thought if she remained quiet and out of the way she would reach out to patient but hasn't. Hadn't reached out to her for a year. Hurt her feelings really bad, went back to sit in room and cry. Can't talk about it without getting upset, can't think about it without getting upset. Can't talk to [facility] much because it makes her feel bad. She knows why she isn't talking. The closest she learned is jealous of Selina and patient relates that she can't help that they are close. Patient talked about it before adoption with [name] and she didn't have a problem with it. Prior [name] wanted to be the only one until patient married to [place] and have stepsisters. [name] is close now with them. Family never did the silent treatment confront it and move on. [name] family did it and years going on without talking. Patient shared goes on daughters website "[employer]" where people share what they got in thrift shops and things collect, also a site to sell things. Encouraged therapist to go on the site.        Suicidal/Homicidal: No  Therapist Response: Therapist reviewed symptoms, facilitated expression of thoughts and feelings as main treatment intervention to help cope with feelings.  Continue to guide patient in focusing on  positive developments such as weight loss, additionally her qualities such as generosity to family members.  Completed treatment plan and patient gave consent to complete virtually relating for her one of the most effective treatment interventions is having someone to talk to.  Processed feelings related to estrangement with daughter again therapist focusing  on patient's positive qualities and continuing to reach out to daughter, insight building in terms of recognizing simple communication could help them work through the issue if only daughter would allow but unfortunately she so far engaging in behaviors that are not helpful for mending relationship.  Provided positive feedback for patient keeping the open to allow for possibility.  Therapist provided active listening, open questions supportive interventions.  Plan: Return again in 2 weeks.2.Therapist continue to use strength-based and supportive interventions well processing patient's feelings helping her with coping and stressors  Diagnosis: Axis I:  major depressive disorder, recurrent, moderate, degenerative disc disease, chronic pain syndrome, generalized anxiety disorder    Axis II: No diagnosis    Coolidge Breeze, LCSW 02/05/2020

## 2020-02-12 ENCOUNTER — Encounter: Payer: Medicare Other | Attending: Physical Medicine and Rehabilitation | Admitting: Registered Nurse

## 2020-02-12 ENCOUNTER — Other Ambulatory Visit: Payer: Self-pay

## 2020-02-12 ENCOUNTER — Encounter: Payer: Self-pay | Admitting: Registered Nurse

## 2020-02-12 VITALS — BP 104/68 | HR 86 | Temp 99.0°F | Ht 68.0 in | Wt 245.4 lb

## 2020-02-12 DIAGNOSIS — M1711 Unilateral primary osteoarthritis, right knee: Secondary | ICD-10-CM | POA: Diagnosis not present

## 2020-02-12 DIAGNOSIS — Z5181 Encounter for therapeutic drug level monitoring: Secondary | ICD-10-CM | POA: Insufficient documentation

## 2020-02-12 DIAGNOSIS — M546 Pain in thoracic spine: Secondary | ICD-10-CM | POA: Insufficient documentation

## 2020-02-12 DIAGNOSIS — M47816 Spondylosis without myelopathy or radiculopathy, lumbar region: Secondary | ICD-10-CM | POA: Diagnosis not present

## 2020-02-12 DIAGNOSIS — Z79899 Other long term (current) drug therapy: Secondary | ICD-10-CM | POA: Insufficient documentation

## 2020-02-12 DIAGNOSIS — M1712 Unilateral primary osteoarthritis, left knee: Secondary | ICD-10-CM | POA: Insufficient documentation

## 2020-02-12 DIAGNOSIS — G43119 Migraine with aura, intractable, without status migrainosus: Secondary | ICD-10-CM | POA: Diagnosis not present

## 2020-02-12 DIAGNOSIS — G8929 Other chronic pain: Secondary | ICD-10-CM | POA: Insufficient documentation

## 2020-02-12 DIAGNOSIS — F329 Major depressive disorder, single episode, unspecified: Secondary | ICD-10-CM | POA: Insufficient documentation

## 2020-02-12 DIAGNOSIS — G9050 Complex regional pain syndrome I, unspecified: Secondary | ICD-10-CM | POA: Insufficient documentation

## 2020-02-12 DIAGNOSIS — G894 Chronic pain syndrome: Secondary | ICD-10-CM | POA: Insufficient documentation

## 2020-02-12 MED ORDER — CITALOPRAM HYDROBROMIDE 40 MG PO TABS: ORAL_TABLET | ORAL | 5 refills | Status: DC

## 2020-02-12 MED ORDER — MORPHINE SULFATE 15 MG PO TABS: 15.0000 mg | ORAL_TABLET | Freq: Four times a day (QID) | ORAL | 0 refills | Status: DC | PRN

## 2020-02-12 MED ORDER — LAMOTRIGINE 25 MG PO TABS: ORAL_TABLET | ORAL | 3 refills | Status: DC

## 2020-02-12 MED ORDER — MORPHINE SULFATE ER 60 MG PO TBCR: 60.0000 mg | EXTENDED_RELEASE_TABLET | Freq: Two times a day (BID) | ORAL | 0 refills | Status: DC

## 2020-02-12 MED ORDER — MORPHINE SULFATE ER 30 MG PO TBCR: 30.0000 mg | EXTENDED_RELEASE_TABLET | Freq: Two times a day (BID) | ORAL | 0 refills | Status: DC

## 2020-02-12 NOTE — Progress Notes (Signed)
Subjective:    Patient ID: [name], female    DOB: 06-Jan-1961, 60 y.o.   MRN: [id number]  HPI: [name] is a 60 y.o. female who returns for follow up appointment for chronic pain and medication refill. She states her  pain is located in her mid- lower back pain and bilateral knee pain R>L. She rates her pain 8. Her current exercise regime is walking and performing stretching exercises with videos.  [name] equivalent is 240.58 MME.  Last Oral Swab was Performed on 12/22/2019, it was consistent.   Pain Inventory Average Pain 8 Pain Right Now 8 My pain is constant, sharp, burning, dull, stabbing, tingling and aching  In the last 24 hours, has pain interfered with the following? General activity 10 Relation with others 10 Enjoyment of life 10 What TIME of day is your pain at its worst? morning , daytime, evening and night Sleep (in general) Poor  Pain is worse with: walking, bending, standing and some activites Pain improves with: rest, heat/ice, therapy/exercise, pacing activities, medication, TENS and injections Relief from Meds: 7  Family History  Problem Relation Age of Onset  . Depression Mother   . Hypertension Mother   . Hypertension Father   . Heart failure Father   . Diabetes Father   . Heart attack Father   . Colon cancer Neg Hx   . Esophageal cancer Neg Hx   . Stomach cancer Neg Hx   . Rectal cancer Neg Hx    Social History   Socioeconomic History  . Marital status: Married    Spouse name: [name]  . Number of children: 1  . Years of education: some college  . Highest education level: Not on file  Occupational History  . Occupation: On disability    Employer: UNEMPLOYED  Tobacco Use  . Smoking status: Former Smoker    Packs/day: 0.50    Types: Cigarettes    Quit date: 02/06/2016    Years since quitting: 4.0  . Smokeless tobacco: Never Used  . Tobacco comment: going to try e-sticks to quit  Substance and Sexual  Activity  . Alcohol use: No    Alcohol/week: 0.0 standard drinks  . Drug use: No  . Sexual activity: Not Currently    Partners: Male  Other Topics Concern  . Not on file  Social History Narrative   8-10 cups of soda a day.  On disability. No regular exercise.     Social Determinants of Health   Financial Resource Strain: Not on file  Food Insecurity: Not on file  Transportation Needs: Not on file  Physical Activity: Not on file  Stress: Not on file  Social Connections: Not on file   Past Surgical History:  Procedure Laterality Date  . ABDOMINAL HYSTERECTOMY  May 2004  . Basel Cell Carcinoma  06/2005, 07/2005   nasal tip and reconstruction  . BREAST CYST ASPIRATION    . CARPAL TUNNEL RELEASE  07/1993   both hands  . COLONOSCOPY    . HEMORROIDECTOMY  July 2003  . KNEE ARTHROPLASTY  09/2001, 05/2003   left knee, chondromalasia  . KNEE ARTHROSCOPY  jan 2005   Right knee, tisse release, chrondromalacia  . REPLACEMENT TOTAL KNEE  Nov 2005   Left   . THYROID LOBECTOMY  01/2005   malignant area removed from right lobe  . THYROID SURGERY     thyroid lobe removal  . TONSILLECTOMY  09/1972  . TUBAL LIGATION  Sept 1999  .  Ulnar nerve entrapment  Feb 1995   left elbow   Past Surgical History:  Procedure Laterality Date  . ABDOMINAL HYSTERECTOMY  May 2004  . Basel Cell Carcinoma  06/2005, 07/2005   nasal tip and reconstruction  . BREAST CYST ASPIRATION    . CARPAL TUNNEL RELEASE  07/1993   both hands  . COLONOSCOPY    . HEMORROIDECTOMY  July 2003  . KNEE ARTHROPLASTY  09/2001, 05/2003   left knee, chondromalasia  . KNEE ARTHROSCOPY  jan 2005   Right knee, tisse release, chrondromalacia  . REPLACEMENT TOTAL KNEE  Nov 2005   Left   . THYROID LOBECTOMY  01/2005   malignant area removed from right lobe  . THYROID SURGERY     thyroid lobe removal  . TONSILLECTOMY  09/1972  . TUBAL LIGATION  Sept 1999  . Ulnar nerve entrapment  Feb 1995   left elbow   Past Medical History:   Diagnosis Date  . Allergy   . Anxiety   . Cancer ([place])    cancerous nodules on thyroid  . Cancer ([place])    basal cell Cancer-nose  . Chronic pain syndrome   . COPD (chronic obstructive pulmonary disease) ([place])   . Degeneration of lumbar or lumbosacral intervertebral disc   . Depression   . Facet syndrome, lumbar   . GERD (gastroesophageal reflux disease)   . Hyperlipidemia   . Intervertebral lumbar disc disorder with myelopathy, lumbar region   . Lumbosacral spondylosis without myelopathy   . Migraine without aura, with intractable migraine, so stated, without mention of status migrainosus   . Primary localized osteoarthrosis, lower leg   . Thyroid disease    hypothyroid  . Unspecified musculoskeletal disorders and symptoms referable to neck    cervical/trapezius   BP 104/68   Pulse 86   Temp 99 F (37.2 C)   Ht 5\' 8"  (1.727 m)   Wt 245 lb 6.4 oz (111.3 kg)   SpO2 93%   BMI 37.31 kg/m   Opioid Risk Score:   Fall Risk Score:  `1  Depression screen PHQ 2/9  Depression screen [facility] 2/9 02/12/2020 01/21/2020 12/22/2019 08/29/2019 08/26/2019 08/01/2019 06/05/2019  Decreased Interest 2 1 2  0 2 3 3   Down, Depressed, Hopeless 2 1 2  0 2 3 3   PHQ - 2 Score 4 2 4  0 4 6 6   Altered sleeping - - - - 3 - -  Tired, decreased energy - - - - 3 - -  Change in appetite - - - - 3 - -  Feeling bad or failure about yourself  - - - - 3 - -  Trouble concentrating - - - - 1 - -  Moving slowly or fidgety/restless - - - - 0 - -  Suicidal thoughts - - - - 0 - -  PHQ-9 Score - - - - 17 - -  Difficult doing work/chores - - - - Extremely dIfficult - -  Some encounter information is confidential and restricted. Go to Review Flowsheets activity to see all data.  Some recent data might be hidden    Review of Systems  Constitutional: Negative.   HENT: Negative.   Eyes: Negative.   Respiratory: Negative.   Cardiovascular: Positive for leg swelling.  Gastrointestinal: Negative.   Endocrine:  Negative.   Genitourinary: Negative.   Musculoskeletal: Positive for arthralgias, back pain and gait problem.  Skin: Negative.   Hematological: Negative.   Psychiatric/Behavioral: Positive for dysphoric mood.  All other systems  reviewed and are negative.      Objective:   Physical Exam Vitals and nursing note reviewed.  Constitutional:      Appearance: Normal appearance.  Cardiovascular:     Rate and Rhythm: Normal rate and regular rhythm.     Pulses: Normal pulses.     Heart sounds: Normal heart sounds.  Pulmonary:     Effort: Pulmonary effort is normal.     Breath sounds: Normal breath sounds.  Musculoskeletal:     Cervical back: Normal range of motion and neck supple.     Comments: Normal Muscle Bulk and Muscle Testing Reveals:  Upper Extremities: Full ROM and Muscle Strength 5/5 Thoracic Paraspinal Tenderness: T-3- T-7 Lumbar Paraspinal Tenderness: L-4-L-5 Lower Extremities: Decreased ROM and Muscle Strength 4/5 Bilateral Lower Extremities Flexion Produces Pain into her Bilateral Patella  Arises from Table slowly using cane for support Antalgic Gait   Skin:    General: Skin is warm and dry.  Neurological:     Mental Status: She is alert and oriented to person, place, and time.  Psychiatric:        Mood and Affect: Mood normal.        Behavior: Behavior normal.           Assessment & Plan:  1.Chronic Bilateral Thoracic Back Pain/ Low Back Pain/ Lumbar Facet Arthropathy:RefilledMSIR15mg  one tablet every 6 hoursas needed #105andContinue withSlow weaning ofMorphine. Refilled:MS Contin 60 mg and 30 mg Q 12 hours to equal 90 mg #60.Continue Gabapentin and Pamelor.02/12/2020 We will continue the opioid monitoring program, this consists of regular clinic visits, examinations, urine drug screen, pill counts as well as use of [place] Controlled Substance Reporting system. A 12 month History has been reviewed on the [place] Controlled Substance  Reporting Systemon01/07/2020. 2. Degenerative Disk Disease:Encouraged Continue HEP as Tolertatedand heat therapy. Continue Current Medication Regime.02/12/2020 3.Bilateral Greater Trochanteric Bursitis: No complaints today. Continuecurrent treatment regimenwith Heat and Ice Therapy.02/12/2020 4. Osteoarthritis of Bilateral Knees: Continue current treatment modality with homeexerciseprogramand heat therapy.02/12/2020 5. Migraine Headaches:Continue withMigraine Journal. Aimovigdiscontinued due to Hives. ContinueLamictalandMaxalt. Continue to Monitor.02/12/2020 6. Smoking Cessation: She has quit smokingsince 08/25/2016. Continue to Monitor.02/12/2020 7.Constipation: ContinueCurrent medication regime withLinzess.02/12/2020 8. Muscle Spasm: Continuecurrent medication regime withTizanidine.02/12/2020 9. DepressionAnxiety/ Panic Attacks: Continuecurrent medication regimen and treatment modality.Celexa.Lamictaland Counseling with [name] andDr.Akhtar Psychiatrist.02/12/2020 10. RSD: Continue with current medication Regimewith Gabapentin.02/12/2020  F/U in 1 month

## 2020-02-16 ENCOUNTER — Ambulatory Visit (INDEPENDENT_AMBULATORY_CARE_PROVIDER_SITE_OTHER): Payer: Medicare Other | Admitting: Licensed Clinical Social Worker

## 2020-02-16 DIAGNOSIS — F331 Major depressive disorder, recurrent, moderate: Secondary | ICD-10-CM | POA: Diagnosis not present

## 2020-02-16 DIAGNOSIS — G894 Chronic pain syndrome: Secondary | ICD-10-CM

## 2020-02-16 DIAGNOSIS — F411 Generalized anxiety disorder: Secondary | ICD-10-CM | POA: Diagnosis not present

## 2020-02-16 DIAGNOSIS — M5136 Other intervertebral disc degeneration, lumbar region: Secondary | ICD-10-CM | POA: Diagnosis not present

## 2020-02-16 NOTE — Progress Notes (Signed)
Virtual Visit via Telephone Note  I connected with [name] on 02/16/20 at  8:00 AM EST by telephone and verified that I am speaking with the correct person using two identifiers.  Location: Patient: home Provider: home office   I discussed the limitations, risks, security and privacy concerns of performing an evaluation and management service by telephone and the availability of in person appointments. I also discussed with the patient that there may be a patient responsible charge related to this service. The patient expressed understanding and agreed to proceed.   I discussed the assessment and treatment plan with the patient. The patient was provided an opportunity to ask questions and all were answered. The patient agreed with the plan and demonstrated an understanding of the instructions.   The patient was advised to call back or seek an in-person evaluation if the symptoms worsen or if the condition fails to improve as anticipated.  I provided 45 minutes of non-face-to-face time during this encounter.  THERAPIST PROGRESS NOTE  Session Time: 8:00 AM to 8:45 AM  Participation Level: Active  Behavioral Response: CasualAlertDysphoric  Type of Therapy: Individual Therapy  Treatment Goals addressed:  Decrease in depression, anxiety, stress management, coping Interventions: Solution Focused, Strength-based, Supportive, Reframing and Other: coping  Summary: KHANIYA TENAGLIA is a 60 y.o. female who presents with mood not bad, "I'm not so bad this morning." Down a lot since before the holidays but thinks it is normal. Christmas is the children and only child is [name] (12) and she [place] and not the same. Not felt well last week or so had a migraine went away the day before yesterday and came back yesterday.Don't usually do that. Have been in the bed last week easier to deal with the pain and laying down.  Described her interactions with [name] helps to have  Skype although not the same as having her close by.  In Terms of where she would want to live retirement being near her family would be important hard because family is spread [place] the [place].  Has normal part of session shares amusing stories that have to do with things that are important to her such as quirky things her dog will do.  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  Therapist assesses after reviewing session helpful patient's mood in interacting with therapist talking on different subjects and interests.       Suicidal/Homicidal: No  Therapist Response: Therapist reviewed symptoms, facilitated expression of thoughts and feelings help process feelings as a coping strategy identifying low mood related to holiday, pain from medical issues.  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  Therapist provided active listening, open questions supportive interventions  Plan: Return again in 2 weeks.2.Therapist continue to use strength-based and supportive interventions well processing patient's feelings helping her with coping and stressors  Diagnosis: Axis I: major depressive disorder, recurrent, moderate, degenerative disc disease, chronic pain syndrome, generalized anxiety disorder    Axis II: No diagnosis    [name], LCSW 02/16/2020

## 2020-02-20 ENCOUNTER — Encounter (HOSPITAL_COMMUNITY): Payer: Self-pay | Admitting: Psychiatry

## 2020-02-20 ENCOUNTER — Telehealth (INDEPENDENT_AMBULATORY_CARE_PROVIDER_SITE_OTHER): Payer: Medicare Other | Admitting: Psychiatry

## 2020-02-20 DIAGNOSIS — F331 Major depressive disorder, recurrent, moderate: Secondary | ICD-10-CM | POA: Diagnosis not present

## 2020-02-20 DIAGNOSIS — G894 Chronic pain syndrome: Secondary | ICD-10-CM

## 2020-02-20 DIAGNOSIS — F411 Generalized anxiety disorder: Secondary | ICD-10-CM | POA: Diagnosis not present

## 2020-02-20 NOTE — Progress Notes (Signed)
[facility] Outpatient Follow up visit  Patient Identification: [name] MRN:  [id number] Date of Evaluation:  02/20/2020 Referral Source: primary care Chief Complaint:    follow up depression Visit Diagnosis:    ICD-10-CM   1. Major depressive disorder, recurrent episode, moderate (HCC)  F33.1   2. GAD (generalized anxiety disorder)  F41.1   3. Chronic pain syndrome  G89.4      I connected with [name] on 02/20/20 at 10:30 AM EST by telephone and verified that I am speaking with the correct person using two identifiers.    I discussed the limitations, risks, security and privacy concerns of performing an evaluation and management service by telephone and the availability of in person appointments. I also discussed with the patient that there may be a patient responsible charge related to this service. The patient expressed understanding and agreed to proceed. Patient location home Provider location home office  History of Present Illness:  60 years old white married female. Referred by pain clinic for counselling and depression  Patient suffers from chronic back condition multiple pain conditions including at the joints surgery in the past including knee replacement.  She takes morphine 4 times a day also on gabapentin.  She takes Lamictal for migraine   Pain effects mood, husband is supportive Takes gaba, celexa, lamictal from primary care  States she is on Slow taper of morphine  Mostly getting therapy from our office, meds reviewed but pcp refills them  Stress related to biological daughter doesn't talk to her Patient has adapted daughter whom is close to her.   Modifying factor: husband, stays at home  Tolerating medications.    Aggravating F:multiple medical conditions , arthirits, Degenerative joints disease. Chronic back condition, migraine Limited movements   Past Psychiatric History: depression, anxiety, chronic pain  Previous Psychotropic  Medications: Yes   Substance Abuse History in the last 12 months:  No.  On pain meds as prescribed by provider for arthritis  Past Medical History:  Past Medical History:  Diagnosis Date  . Allergy   . Anxiety   . Cancer ([place])    cancerous nodules on thyroid  . Cancer ([place])    basal cell Cancer-nose  . Chronic pain syndrome   . COPD (chronic obstructive pulmonary disease) ([place])   . Degeneration of lumbar or lumbosacral intervertebral disc   . Depression   . Facet syndrome, lumbar   . GERD (gastroesophageal reflux disease)   . Hyperlipidemia   . Intervertebral lumbar disc disorder with myelopathy, lumbar region   . Lumbosacral spondylosis without myelopathy   . Migraine without aura, with intractable migraine, so stated, without mention of status migrainosus   . Primary localized osteoarthrosis, lower leg   . Thyroid disease    hypothyroid  . Unspecified musculoskeletal disorders and symptoms referable to neck    cervical/trapezius    Past Surgical History:  Procedure Laterality Date  . ABDOMINAL HYSTERECTOMY  May 2004  . Basel Cell Carcinoma  06/2005, 07/2005   nasal tip and reconstruction  . BREAST CYST ASPIRATION    . CARPAL TUNNEL RELEASE  07/1993   both hands  . COLONOSCOPY    . HEMORROIDECTOMY  July 2003  . KNEE ARTHROPLASTY  09/2001, 05/2003   left knee, chondromalasia  . KNEE ARTHROSCOPY  jan 2005   Right knee, tisse release, chrondromalacia  . REPLACEMENT TOTAL KNEE  Nov 2005   Left   . THYROID LOBECTOMY  01/2005   malignant area removed from right  lobe  . THYROID SURGERY     thyroid lobe removal  . TONSILLECTOMY  09/1972  . TUBAL LIGATION  Sept 1999  . Ulnar nerve entrapment  Feb 1995   left elbow    Family Psychiatric History: mom : depression  Family History:  Family History  Problem Relation Age of Onset  . Depression Mother   . Hypertension Mother   . Hypertension Father   . Heart failure Father   . Diabetes Father   . Heart attack Father   .  Colon cancer Neg Hx   . Esophageal cancer Neg Hx   . Stomach cancer Neg Hx   . Rectal cancer Neg Hx     Social History:   Social History   Socioeconomic History  . Marital status: Married    Spouse name: [name]  . Number of children: 1  . Years of education: some college  . Highest education level: Not on file  Occupational History  . Occupation: On disability    Employer: UNEMPLOYED  Tobacco Use  . Smoking status: Former Smoker    Packs/day: 0.50    Types: Cigarettes    Quit date: 02/06/2016    Years since quitting: 4.0  . Smokeless tobacco: Never Used  . Tobacco comment: going to try e-sticks to quit  Substance and Sexual Activity  . Alcohol use: No    Alcohol/week: 0.0 standard drinks  . Drug use: No  . Sexual activity: Not Currently    Partners: Male  Other Topics Concern  . Not on file  Social History Narrative   8-10 cups of soda a day.  On disability. No regular exercise.     Social Determinants of Health   Financial Resource Strain: Not on file  Food Insecurity: Not on file  Transportation Needs: Not on file  Physical Activity: Not on file  Stress: Not on file  Social Connections: Not on file      Allergies:   Allergies  Allergen Reactions  . Aspirin Other (See Comments)    Abdominal bleeding   . Ibuprofen Swelling  . Imitrex [Sumatriptan Base] Other (See Comments)    Drops BP   . Norvasc [Amlodipine Besylate] Swelling  . Nsaids Swelling  . Tape     Skin blisters under surgical tape  . Topamax [Topiramate] Other (See Comments)    Hair loss    Metabolic Disorder Labs: Lab Results  Component Value Date   HGBA1C 6.1 (H) 10/11/2017   MPG 128 10/11/2017   MPG 131 05/26/2015   No results found for: PROLACTIN Lab Results  Component Value Date   CHOL 131 10/11/2017   TRIG 162 (H) 10/11/2017   HDL 47 (L) 10/11/2017   CHOLHDL 2.8 10/11/2017   VLDL 46 (H) 05/26/2015   LDLCALC 60 10/11/2017   LDLCALC 67 05/26/2015     Current  Medications: Current Outpatient Medications  Medication Sig Dispense Refill  . Blood Glucose Monitoring Suppl (RELION CONFIRM GLUCOSE MONITOR) w/Device KIT by Does not apply route.    . ciclopirox (PENLAC) 8 % solution Apply topically at bedtime. Apply over nail and surrounding skin. Apply daily over previous coat. After seven (7) days, may remove with alcohol and continue cycle. 6.6 mL 3  . citalopram (CELEXA) 40 MG tablet TAKE 1 TABLET(40 MG) BY MOUTH AT BEDTIME 30 tablet 5  . gabapentin (NEURONTIN) 800 MG tablet TAKE 1 TABLET(800 MG) BY MOUTH FOUR TIMES DAILY 120 tablet 5  . lamoTRIgine (LAMICTAL) 25 MG tablet TAKE 1  TABLET(25 MG) BY MOUTH DAILY 30 tablet 3  . levothyroxine (SYNTHROID) 25 MCG tablet TAKE 1 TABLET BY MOUTH DAILY BEFORE BREAKFAST 90 tablet 1  . LINZESS 145 MCG CAPS capsule TAKE 1 CAPSULE(145 MCG) BY MOUTH DAILY 30 capsule 3  . metFORMIN (GLUCOPHAGE-XR) 500 MG 24 hr tablet Take 1 tablet by mouth daily with supper.    . morphine (MS CONTIN) 30 MG 12 hr tablet Take 1 tablet (30 mg total) by mouth every 12 (twelve) hours. 60 tablet 0  . morphine (MS CONTIN) 60 MG 12 hr tablet Take 1 tablet (60 mg total) by mouth every 12 (twelve) hours. 60 tablet 0  . morphine (MSIR) 15 MG tablet Take 1 tablet (15 mg total) by mouth every 6 (six) hours as needed for severe pain. 105 tablet 0  . nortriptyline (PAMELOR) 50 MG capsule Take 2 capsules (100 mg total) by mouth at bedtime. 180 capsule 5  . pravastatin (PRAVACHOL) 40 MG tablet Take 1 tablet (40 mg total) by mouth daily. LAST REFILL MUST SCHEDULE AND KEEP APPOINTMENT LABS REQUIRED FOR REFILLS 30 tablet 0  . promethazine (PHENERGAN) 12.5 MG tablet TAKE 1 TABLET(12.5 MG) BY MOUTH EVERY 12 HOURS 30 tablet 2  . rizatriptan (MAXALT) 10 MG tablet TAKE 1 TABLET BY MOUTH AS NEEDED MAY REPEAT AFTER 2 HOURS AS NEEDED 30 tablet 5  . tiZANidine (ZANAFLEX) 2 MG tablet TAKE 1 TABLET(2 MG) BY MOUTH THREE TIMES DAILY AS NEEDED FOR MUSCLE SPASMS 90 tablet 5   . tolterodine (DETROL LA) 4 MG 24 hr capsule TAKE 1 CAPSULE(4 MG) BY MOUTH DAILY 90 capsule 1   No current facility-administered medications for this visit.     Psychiatric Specialty Exam: Review of Systems  Constitutional: Positive for malaise/fatigue.  Cardiovascular: Negative for palpitations.  Musculoskeletal: Positive for myalgias. Negative for neck pain.  Skin: Negative for rash.    There were no vitals taken for this visit.There is no height or weight on file to calculate BMI.  General Appearance:   Eye Contact:   Speech:  Slow  Volume:  Normal  Mood: somewhat subdued  Affect: constricted  Thought Process:  Goal Directed  Orientation:  Full (Time, Place, and Person)  Thought Content:  Rumination  Suicidal Thoughts:  No  Homicidal Thoughts:  No  Memory:  Immediate;   Fair Recent;   Fair  Judgement:  Fair  Insight:  Fair  Psychomotor Activity:  Decreased  Concentration:  Concentration: Fair and Attention Span: Fair  Recall:  [employer] of Knowledge:Good  Language: Good  Akathisia:  No  Handed:  Right  AIMS (if indicated):    Assets:  Desire for Improvement  ADL's:  Intact  Cognition: WNL  Sleep:  variable    Treatment Plan Summary: Medication management and Plan as follows  MDD, moderate recurrent; somewhat subdued but baseline, continue meds, celexa and others from primary care Provided supportive therapy in regard to her daughter and consider individual therapy GADsporadic ,. Continue gabapentin, celexa  Supportive husband  Chronic back pain and ARthritis: on meds by providers.   Fu 5-6 m. Questions addressed    I discussed the assessment and treatment plan with the patient. The patient was provided an opportunity to ask questions and all were answered. The patient agreed with the plan and demonstrated an understanding of the instructions.   The patient was advised to call back or seek an in-person evaluation if the symptoms worsen or if the  condition fails to improve as anticipated.  Non face to face time spent: 33mn  NMerian Capron MD 1/14/202210:50 AM

## 2020-03-01 ENCOUNTER — Ambulatory Visit (INDEPENDENT_AMBULATORY_CARE_PROVIDER_SITE_OTHER): Payer: Medicare Other | Admitting: Licensed Clinical Social Worker

## 2020-03-01 DIAGNOSIS — F331 Major depressive disorder, recurrent, moderate: Secondary | ICD-10-CM | POA: Diagnosis not present

## 2020-03-01 DIAGNOSIS — F411 Generalized anxiety disorder: Secondary | ICD-10-CM

## 2020-03-01 DIAGNOSIS — G894 Chronic pain syndrome: Secondary | ICD-10-CM | POA: Diagnosis not present

## 2020-03-01 DIAGNOSIS — M5136 Other intervertebral disc degeneration, lumbar region: Secondary | ICD-10-CM | POA: Diagnosis not present

## 2020-03-01 NOTE — Progress Notes (Signed)
Virtual Visit via Telephone Note  I connected with [name] on 03/01/20 at  8:00 AM EST by telephone and verified that I am speaking with the correct person using two identifiers.  Location: Patient: home Provider: home office   I discussed the limitations, risks, security and privacy concerns of performing an evaluation and management service by telephone and the availability of in person appointments. I also discussed with the patient that there may be a patient responsible charge related to this service. The patient expressed understanding and agreed to proceed.   I discussed the assessment and treatment plan with the patient. The patient was provided an opportunity to ask questions and all were answered. The patient agreed with the plan and demonstrated an understanding of the instructions.   The patient was advised to call back or seek an in-person evaluation if the symptoms worsen or if the condition fails to improve as anticipated.  I provided 53 minutes of non-face-to-face time during this encounter.  THERAPIST PROGRESS NOTE  Session Time: 8:00 AM to 8:53 AM  Participation Level: Active  Behavioral Response: CasualAlertappropriate  Type of Therapy: Individual Therapy  Treatment Goals addressed: Decrease in depression, anxiety, stress management, coping Interventions: Solution Focused, Strength-based, Supportive, Reframing and Other: coping  Summary: [name] is a 60 y.o. female who presents with.talking about some of her happiest memories watching grandmother doing prep work for cooking. Learned a lot from her. Talked about recipes. Patient shared misses her own cooking was a good cook. One of her favorite creamed potatoes and cubed steaks. Talked about [place] and family lives close "everybody helped everybody".shared how she felt about changes. "I think it is sad how it has changed." Once done with chores the time was theirs. They would be  all over their community. Sad today can't let your kids run around and play. [place]. "It was awesome." Mom did blueprints for house so it was her dream house[place] Parents when older ended up living and buying grandparents property. Talked about mom died about four years and took 3 years to settle her affairs with complications. Felt sister really deserved compensation because settling affairs is complicated anyway and additionally complicated with managing mom's affairs.           Suicidal/Homicidal: No  Therapist Response: Therapist reviewed symptoms, facilitated expression of thoughts and feelings, today's session patient shared about her childhood growing up therapist assesses helpful for mood to remember all the good memories of childhood.  Assess a type of reframing intervention for patient sent to focus on a lot of positive experiences.  Therapist provided active listening open questions supportive interventions  Plan: Return again in 2 weeks.2.Therapist continue to use strength-based and supportive interventions well processing patient's feelings helping her with coping and stressors  Diagnosis: Axis I: major depressive disorder, recurrent, moderate, degenerative disc disease, chronic pain syndrome, generalized anxiety disorder    Axis II: No diagnosis    [name], LCSW 03/01/2020

## 2020-03-15 ENCOUNTER — Ambulatory Visit (INDEPENDENT_AMBULATORY_CARE_PROVIDER_SITE_OTHER): Payer: Medicare Other | Admitting: Licensed Clinical Social Worker

## 2020-03-15 DIAGNOSIS — F331 Major depressive disorder, recurrent, moderate: Secondary | ICD-10-CM | POA: Diagnosis not present

## 2020-03-15 DIAGNOSIS — G894 Chronic pain syndrome: Secondary | ICD-10-CM

## 2020-03-15 DIAGNOSIS — M5136 Other intervertebral disc degeneration, lumbar region: Secondary | ICD-10-CM

## 2020-03-15 DIAGNOSIS — F411 Generalized anxiety disorder: Secondary | ICD-10-CM

## 2020-03-15 NOTE — Progress Notes (Signed)
Virtual Visit via Telephone Note  I connected with [name] on 03/15/20 at  8:00 AM EST by telephone and verified that I am speaking with the correct person using two identifiers.  Location: Patient: home  Provider: home office   I discussed the limitations, risks, security and privacy concerns of performing an evaluation and management service by telephone and the availability of in person appointments. I also discussed with the patient that there may be a patient responsible charge related to this service. The patient expressed understanding and agreed to proceed.   I discussed the assessment and treatment plan with the patient. The patient was provided an opportunity to ask questions and all were answered. The patient agreed with the plan and demonstrated an understanding of the instructions.   The patient was advised to call back or seek an in-person evaluation if the symptoms worsen or if the condition fails to improve as anticipated.  I provided 52 minutes of non-face-to-face time during this encounter.  THERAPIST PROGRESS NOTE  Session Time: 8:00 AM to 8:52 AM  Participation Level: Active  Behavioral Response: CasualAlertDysphoric  Type of Therapy: Individual Therapy  Treatment Goals addressed: Decrease in depression, anxiety, stress management, coping Interventions: Solution Focused, Strength-based, Supportive and Other: coping  Summary: [name] is a 60 y.o. female who presents with nursing a migraine for day four days up all night. Has a medication Maxalt. If she can get in her when first starting to hurt sometimes it will stop it. If doesn't stop can take another can only take two in 24 hours. Most of the time wake up with it. "Once it got its tooth in me won't let go.". There are new medications but not on formulary. Has [employer] as secondary pretty much would pay for it. [employer] said have to go through their formulary but allergic to a lot of things. Most  of the time grim and bear it. Has not gotten updated formulary and waiting on it. Doesn't understand why get migraines when takes pain medication. Doesn't understand why get there, there are no nerves in her brain how hurt but hurt. Granddaughter had her first "bless her heart" and took her to ER. Patient started at 69. Dad had them but knew what triggers were. Patient kept journals for a long time and calenders-try to figure out for all these years and can't put specifics together for patient. Did biofeedback for herself and didn't work.  Discussed how barometric pressure as possibility. Reviewed any psychological sources when it hurts it is so hard to get out of the hold, the depression. Don't have good days don't have days when doesn't hurt, going on for years now. Sees this therapist for the depression. Has been going to pain clinic for 20 years. Having trouble speaking she reports today (although to this therapist she seems as usual and related that to patient)  describes her medications having "special effects Seems to be getting stupider and stupider and brain best part of her" blamed on getting older but some of it are the side effects from medication. Feels better not just getting stupider. 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 They did a lot of stuff. Build little barns to put stuff in. Roofed a few houses. Had carpentry half the day, PE she loved. Easy senior year. Credits got in sophomore and junior year. If advanced classes  would have been in accelerated classes. Almost a photographic memory. Everything came easy to her and bored all the time. Offered scholarship wasn't interested in college. Wanted to join [employer] and instead got pregnant. Talked about her work in the [employer]. Shared about husband and his stories as Quarry manager and in [employer].      Therapist reviewed symptoms,  facilitated expression of thoughts and feelings and assess helpful for patient talk about positive past memories, her skills and strengths as well as fond memories helpful for working on treatment goal of depression.  Reviewed pain questions and record and assess is helpful to know more specifically about pain issues patient has to deal with.  Noted as well she is followed at pain clinic.  Noted specifically with strengths that things came easier to patient, always had the brains, facility at learning different skills says helpful to focus on this for patient.  Also feel session was for most distraction as an patient sharing happy memories able to laugh not focus on pain issues.  Therapist provided strength based and supportive intervention. Suicidal/Homicidal: No  Plan: Return again in 2 weeks.2.Therapist utilize treatment interventions to help with depression, coping  Diagnosis: Axis I: major depressive disorder, recurrent, moderate, degenerative disc disease, chronic pain syndrome, generalized anxiety disorder    Axis II: No diagnosis    [name], LCSW 03/15/2020

## 2020-03-16 ENCOUNTER — Encounter: Payer: Self-pay | Admitting: Registered Nurse

## 2020-03-16 ENCOUNTER — Encounter: Payer: Medicare Other | Attending: Physical Medicine and Rehabilitation | Admitting: Registered Nurse

## 2020-03-16 ENCOUNTER — Other Ambulatory Visit: Payer: Self-pay

## 2020-03-16 VITALS — BP 144/79 | HR 92 | Temp 99.2°F | Ht 68.0 in | Wt 250.0 lb

## 2020-03-16 DIAGNOSIS — Z5181 Encounter for therapeutic drug level monitoring: Secondary | ICD-10-CM | POA: Diagnosis not present

## 2020-03-16 DIAGNOSIS — M47816 Spondylosis without myelopathy or radiculopathy, lumbar region: Secondary | ICD-10-CM | POA: Diagnosis not present

## 2020-03-16 DIAGNOSIS — Z79899 Other long term (current) drug therapy: Secondary | ICD-10-CM | POA: Diagnosis not present

## 2020-03-16 DIAGNOSIS — G9050 Complex regional pain syndrome I, unspecified: Secondary | ICD-10-CM | POA: Diagnosis not present

## 2020-03-16 DIAGNOSIS — G894 Chronic pain syndrome: Secondary | ICD-10-CM | POA: Insufficient documentation

## 2020-03-16 DIAGNOSIS — M1711 Unilateral primary osteoarthritis, right knee: Secondary | ICD-10-CM | POA: Diagnosis not present

## 2020-03-16 DIAGNOSIS — G43119 Migraine with aura, intractable, without status migrainosus: Secondary | ICD-10-CM | POA: Insufficient documentation

## 2020-03-16 DIAGNOSIS — M1712 Unilateral primary osteoarthritis, left knee: Secondary | ICD-10-CM | POA: Diagnosis not present

## 2020-03-16 DIAGNOSIS — G8929 Other chronic pain: Secondary | ICD-10-CM | POA: Insufficient documentation

## 2020-03-16 DIAGNOSIS — M546 Pain in thoracic spine: Secondary | ICD-10-CM | POA: Diagnosis not present

## 2020-03-16 DIAGNOSIS — F329 Major depressive disorder, single episode, unspecified: Secondary | ICD-10-CM | POA: Insufficient documentation

## 2020-03-16 MED ORDER — MORPHINE SULFATE ER 30 MG PO TBCR: 30.0000 mg | EXTENDED_RELEASE_TABLET | Freq: Two times a day (BID) | ORAL | 0 refills | Status: DC

## 2020-03-16 MED ORDER — MORPHINE SULFATE 15 MG PO TABS: 15.0000 mg | ORAL_TABLET | Freq: Four times a day (QID) | ORAL | 0 refills | Status: DC | PRN

## 2020-03-16 MED ORDER — MORPHINE SULFATE ER 60 MG PO TBCR: 60.0000 mg | EXTENDED_RELEASE_TABLET | Freq: Two times a day (BID) | ORAL | 0 refills | Status: DC

## 2020-03-16 MED ORDER — PROMETHAZINE HCL 12.5 MG PO TABS: ORAL_TABLET | ORAL | 2 refills | Status: DC

## 2020-03-16 NOTE — Progress Notes (Signed)
Subjective:    Patient ID: [name], female    DOB: 05-31-1960, 60 y.o.   MRN: [id number]  HPI: [name] is a 60 y.o. female who returns for follow up appointment for chronic pain and medication refill. She  states her pain is located in her mid- lower back pain  And bilateral knee pain R>L. Also reports she has a Migraine, for the last three days. She is compliant with her medication she reports. She  rates her pain 9. Her current exercise regime is walking and performing stretching exercises.  Ms. Mole Morphine equivalent is 244.50 MME.    Last Oral Swab was Performed on 12/22/2019, it was consistent.    Pain Inventory Average Pain 8 Pain Right Now 9 My pain is constant, sharp, burning, dull, stabbing, tingling and aching  In the last 24 hours, has pain interfered with the following? General activity 10 Relation with others 10 Enjoyment of life 10 What TIME of day is your pain at its worst? morning , daytime, evening and night Sleep (in general) Poor  Pain is worse with: walking, bending, standing and some activites Pain improves with: rest, heat/ice, therapy/exercise, medication, TENS and injections Relief from Meds: 5  Family History  Problem Relation Age of Onset  . Depression Mother   . Hypertension Mother   . Hypertension Father   . Heart failure Father   . Diabetes Father   . Heart attack Father   . Colon cancer Neg Hx   . Esophageal cancer Neg Hx   . Stomach cancer Neg Hx   . Rectal cancer Neg Hx    Social History   Socioeconomic History  . Marital status: Married    Spouse name: [name]  . Number of children: 1  . Years of education: some college  . Highest education level: Not on file  Occupational History  . Occupation: On disability    Employer: UNEMPLOYED  Tobacco Use  . Smoking status: Former Smoker    Packs/day: 0.50    Types: Cigarettes    Quit date: 02/06/2016    Years since quitting: 4.1  . Smokeless tobacco:  Never Used  . Tobacco comment: going to try e-sticks to quit  Substance and Sexual Activity  . Alcohol use: No    Alcohol/week: 0.0 standard drinks  . Drug use: No  . Sexual activity: Not Currently    Partners: Male  Other Topics Concern  . Not on file  Social History Narrative   8-10 cups of soda a day.  On disability. No regular exercise.     Social Determinants of Health   Financial Resource Strain: Not on file  Food Insecurity: Not on file  Transportation Needs: Not on file  Physical Activity: Not on file  Stress: Not on file  Social Connections: Not on file   Past Surgical History:  Procedure Laterality Date  . ABDOMINAL HYSTERECTOMY  May 2004  . Basel Cell Carcinoma  06/2005, 07/2005   nasal tip and reconstruction  . BREAST CYST ASPIRATION    . CARPAL TUNNEL RELEASE  07/1993   both hands  . COLONOSCOPY    . HEMORROIDECTOMY  July 2003  . KNEE ARTHROPLASTY  09/2001, 05/2003   left knee, chondromalasia  . KNEE ARTHROSCOPY  jan 2005   Right knee, tisse release, chrondromalacia  . REPLACEMENT TOTAL KNEE  Nov 2005   Left   . THYROID LOBECTOMY  01/2005   malignant area removed from right lobe  . THYROID  SURGERY     thyroid lobe removal  . TONSILLECTOMY  09/1972  . TUBAL LIGATION  Sept 1999  . Ulnar nerve entrapment  Feb 1995   left elbow   Past Surgical History:  Procedure Laterality Date  . ABDOMINAL HYSTERECTOMY  May 2004  . Basel Cell Carcinoma  06/2005, 07/2005   nasal tip and reconstruction  . BREAST CYST ASPIRATION    . CARPAL TUNNEL RELEASE  07/1993   both hands  . COLONOSCOPY    . HEMORROIDECTOMY  July 2003  . KNEE ARTHROPLASTY  09/2001, 05/2003   left knee, chondromalasia  . KNEE ARTHROSCOPY  jan 2005   Right knee, tisse release, chrondromalacia  . REPLACEMENT TOTAL KNEE  Nov 2005   Left   . THYROID LOBECTOMY  01/2005   malignant area removed from right lobe  . THYROID SURGERY     thyroid lobe removal  . TONSILLECTOMY  09/1972  . TUBAL LIGATION  Sept  1999  . Ulnar nerve entrapment  Feb 1995   left elbow   Past Medical History:  Diagnosis Date  . Allergy   . Anxiety   . Cancer ([place])    cancerous nodules on thyroid  . Cancer ([place])    basal cell Cancer-nose  . Chronic pain syndrome   . COPD (chronic obstructive pulmonary disease) ([place])   . Degeneration of lumbar or lumbosacral intervertebral disc   . Depression   . Facet syndrome, lumbar   . GERD (gastroesophageal reflux disease)   . Hyperlipidemia   . Intervertebral lumbar disc disorder with myelopathy, lumbar region   . Lumbosacral spondylosis without myelopathy   . Migraine without aura, with intractable migraine, so stated, without mention of status migrainosus   . Primary localized osteoarthrosis, lower leg   . Thyroid disease    hypothyroid  . Unspecified musculoskeletal disorders and symptoms referable to neck    cervical/trapezius   BP (!) 144/79   Pulse (!) 101   Temp 99.2 F (37.3 C)   Ht 5\' 8"  (1.727 m)   Wt 250 lb (113.4 kg)   SpO2 96%   BMI 38.01 kg/m   Opioid Risk Score:   Fall Risk Score:  `1  Depression screen PHQ 2/9  Depression screen [facility] 2/9 02/12/2020 01/21/2020 12/22/2019 08/29/2019 08/26/2019 08/01/2019 06/05/2019  Decreased Interest 2 1 2  0 2 3 3   Down, Depressed, Hopeless 2 1 2  0 2 3 3   PHQ - 2 Score 4 2 4  0 4 6 6   Altered sleeping - - - - 3 - -  Tired, decreased energy - - - - 3 - -  Change in appetite - - - - 3 - -  Feeling bad or failure about yourself  - - - - 3 - -  Trouble concentrating - - - - 1 - -  Moving slowly or fidgety/restless - - - - 0 - -  Suicidal thoughts - - - - 0 - -  PHQ-9 Score - - - - 17 - -  Difficult doing work/chores - - - - Extremely dIfficult - -  Some encounter information is confidential and restricted. Go to Review Flowsheets activity to see all data.  Some recent data might be hidden    Review of Systems  Constitutional: Negative.   HENT: Negative.   Eyes: Positive for photophobia and visual disturbance.   Respiratory: Negative.   Cardiovascular: Negative.   Gastrointestinal: Positive for nausea and vomiting.  Endocrine: Negative.   Genitourinary: Negative.  Musculoskeletal: Positive for arthralgias, back pain, gait problem and neck pain.  Skin: Negative.   Allergic/Immunologic: Negative.   Neurological: Positive for headaches.  Psychiatric/Behavioral: Positive for dysphoric mood. The patient is nervous/anxious.   All other systems reviewed and are negative.      Objective:   Physical Exam Vitals and nursing note reviewed.  Constitutional:      Appearance: Normal appearance.  Neck:     Comments: Cervical Paraspinal Tenderness: C-5-C-6 Cardiovascular:     Rate and Rhythm: Normal rate and regular rhythm.     Pulses: Normal pulses.     Heart sounds: Normal heart sounds.  Pulmonary:     Effort: Pulmonary effort is normal.     Breath sounds: Normal breath sounds.  Musculoskeletal:     Cervical back: Normal range of motion and neck supple.     Right lower leg: Edema present.     Left lower leg: Edema present.     Comments: Normal Muscle Bulk and Muscle Testing Reveals:  Upper Extremities: Full ROM and Muscle Strength 5/5  Thoracic Paraspinal Tenderness: T-7-T-9 Lumbar Paraspinal Tenderness: L-4-L-5 Lower Extremities: Decreased ROM and Muscle Strength 5/5 Bilateral Lower Extremity Flexion Produces Pain into her Bilateral Patella's Arises from Table slowly using cane for support Antalgic Gait   Skin:    General: Skin is warm and dry.  Neurological:     Mental Status: She is alert and oriented to person, place, and time.  Psychiatric:        Mood and Affect: Mood normal.        Behavior: Behavior normal.           Assessment & Plan:  1.Chronic Bilateral Thoracic Back Pain/ Low Back Pain/ Lumbar Facet Arthropathy:RefilledMSIR15mg  one tablet every 6 hoursas needed #105andContinue withSlow weaning ofMorphine. Refilled:MS Contin 60 mg and 30 mg Q 12 hours to  equal 90 mg #60.Continue Gabapentin and Pamelor.03/16/2020 We will continue the opioid monitoring program, this consists of regular clinic visits, examinations, urine drug screen, pill counts as well as use of [place] Controlled Substance Reporting system. A 12 month History has been reviewed on the [place] Controlled Substance Reporting Systemon02/09/2020. 2. Degenerative Disk Disease:Encouraged Continue HEP as Tolertatedand heat therapy. Continue Current Medication Regime.03/16/2020 3.Bilateral Greater Trochanteric Bursitis: No complaints today. Continuecurrent treatment regimenwith Heat and Ice Therapy.03/16/2020 4. Osteoarthritis of Bilateral Knees: Continue current treatment modality with homeexerciseprogramand heat therapy.03/16/2020 5. Migraine Headaches:Continue withMigraine Journal. Aimovigdiscontinued due to Hives. ContinueLamictalandMaxalt. Continue to Monitor.03/16/2020 6. Smoking Cessation: She has quit smokingsince 08/25/2016. Continue to Monitor.03/16/2020 7.Constipation: ContinueCurrent medication regime withLinzess.03/16/2020 8. Muscle Spasm: Continuecurrent medication regime withTizanidine.03/16/2020 9. DepressionAnxiety/ Panic Attacks: Continuecurrent medication regimen and treatment modality.Celexa.Lamictaland Counseling with [name] andDr.Akhtar Psychiatrist.03/16/2020 10. RSD: Continue with current medication Regimewith Gabapentin.03/16/2020  F/U in 1 month

## 2020-03-29 ENCOUNTER — Ambulatory Visit (INDEPENDENT_AMBULATORY_CARE_PROVIDER_SITE_OTHER): Payer: Medicare Other | Admitting: Licensed Clinical Social Worker

## 2020-03-29 DIAGNOSIS — M5136 Other intervertebral disc degeneration, lumbar region: Secondary | ICD-10-CM | POA: Diagnosis not present

## 2020-03-29 DIAGNOSIS — F411 Generalized anxiety disorder: Secondary | ICD-10-CM

## 2020-03-29 DIAGNOSIS — G894 Chronic pain syndrome: Secondary | ICD-10-CM | POA: Diagnosis not present

## 2020-03-29 DIAGNOSIS — F331 Major depressive disorder, recurrent, moderate: Secondary | ICD-10-CM

## 2020-03-29 DIAGNOSIS — M51369 Other intervertebral disc degeneration, lumbar region without mention of lumbar back pain or lower extremity pain: Secondary | ICD-10-CM

## 2020-03-29 NOTE — Progress Notes (Signed)
Virtual Visit via Telephone Note  I connected with [name] on 03/29/20 at  8:00 AM EST by telephone and verified that I am speaking with the correct person using two identifiers.  Location: Patient: home Provider: home office   I discussed the limitations, risks, security and privacy concerns of performing an evaluation and management service by telephone and the availability of in person appointments. I also discussed with the patient that there may be a patient responsible charge related to this service. The patient expressed understanding and agreed to proceed.   I discussed the assessment and treatment plan with the patient. The patient was provided an opportunity to ask questions and all were answered. The patient agreed with the plan and demonstrated an understanding of the instructions.   The patient was advised to call back or seek an in-person evaluation if the symptoms worsen or if the condition fails to improve as anticipated.  I provided 54 minutes of non-face-to-face time during this encounter.   THERAPIST PROGRESS NOTE  Session Time: 8:00 AM to 8:54 AM  Participation Level: Active  Behavioral Response: CasualAlertDepressed  Type of Therapy: Individual Therapy  Treatment Goals addressed:   Decrease in depression, anxiety, stress management, coping Interventions: Solution Focused, Strength-based, Supportive and Other: coping  Summary: [name] is a 60 y.o. female who presents with up all night last night. Kind of down and can't sleep. Explored what has got her down not a good month, [name]'s birthday, both pain and thinking about her has her down. In sharing her feelings note significant events in her life that provide more history that help in rapport and interventions. She was married four times.  First husband was [name]. [name] was not quite a year old, was 43-59 years old when pregnant with [name]. He was best friend's older brother. He was sweet and  good. Patient was pregnant and fascinated with her pregnancy. He brought presents. Since having such a hard time with Mom when pregnant was nice to have someone who cared instead of putting her down. Patient was 7 months pregnant and mom was calling [place] that would allow abortion. Patient's father put his foot down with her and said this was going to be first grandchild and stop this. Patient afraid she was going to throw out of house. Thinks it came close. Mom gave [name] a hard time but treated it like a goddess. It was hard on him because patient and Mom fought and loved them both. Mom was more sympathetic when patient started have problems with health. Have had other issues throughout life. Any bug came along patient was the first. "She would get so mad at me and missing school." "Not like I did it on purpose." She was nice to most people, [name] said they were alike, patient did not see it only in the bad way. Their nerves and bone "were shit". Drove [name] crazy with their fighting. Had carpel tunnel in early 90's had same nerve entrapment in elbows. Same nerve branch that go through elbows. Same issue with elbow that had in hand in mid 90's. Three surgeries on right and four on the left elbow. Had thyroid cancer and had to take off half of thyroid. Was having bad periods, another reason don't like doctors sometimes don't listen. Finally found OB listened, left ovary full of cysts had really big fibroids in uterus. Took everything but one ovary do don't have to take hormones. Still have to take Synthroid. Had basil cell on  tip of nose, had to cut through cheek incisions right before mouth peel that up and after clean tissue where cancer was, popped the flap  but sewed it to tip of nose and kept the flap in. Did in four surgeries at first before healed freaked her out. Had to do work in public. Kept in but then removed it, sewed it at tip of nerve. Patient said don't like the scars left on face although  people said did a good job. Did that when 60 years old. So much done have a copy of her medical history. In talking about her history therapist noted some of the positive things she brought up such as had regulars who liked her work in [employer]. Talked about [name], her dog, and knows when not feeling well. Talked about [name] Pulling her cat "was the perfect cat". Also close relationship with [name] who was younger sister and 67 months younger. Discussed her good qualities such as taking care of the trust as well as having tumor below her ear had to take care of.     Therapist reviewed symptoms, facilitated expression of thoughts and feelings reviewed why patient feeling down, exploring the source to help with coping.  Validated patient on how difficult anniversaries can be to normalized patient's experience.  Explored patient's medical history to have a better sense of development of medical issues, have a better understanding to provide supportive interventions at the same time focus on patient's strengths resources and resiliency.  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  Utilize processing of feelings as significant intervention to help patient work through her feelings.  Therapist provided active listening open questions. Suicidal/Homicidal: No  Plan: Return again in 2 weeks.2.Therapist utilize treatment interventions to help with depression, coping  Diagnosis: Axis I: major depressive disorder, recurrent, moderate, degenerative disc disease, chronic pain syndrome, generalized anxiety disorder    Axis II: No diagnosis    Frances Register, LCSW 03/29/2020

## 2020-03-30 ENCOUNTER — Other Ambulatory Visit: Payer: Self-pay | Admitting: Family Medicine

## 2020-03-30 DIAGNOSIS — N3281 Overactive bladder: Secondary | ICD-10-CM

## 2020-04-12 ENCOUNTER — Ambulatory Visit (INDEPENDENT_AMBULATORY_CARE_PROVIDER_SITE_OTHER): Payer: Medicare Other | Admitting: Licensed Clinical Social Worker

## 2020-04-12 DIAGNOSIS — F411 Generalized anxiety disorder: Secondary | ICD-10-CM

## 2020-04-12 DIAGNOSIS — G894 Chronic pain syndrome: Secondary | ICD-10-CM

## 2020-04-12 DIAGNOSIS — F331 Major depressive disorder, recurrent, moderate: Secondary | ICD-10-CM | POA: Diagnosis not present

## 2020-04-12 DIAGNOSIS — M5136 Other intervertebral disc degeneration, lumbar region: Secondary | ICD-10-CM | POA: Diagnosis not present

## 2020-04-12 DIAGNOSIS — M51369 Other intervertebral disc degeneration, lumbar region without mention of lumbar back pain or lower extremity pain: Secondary | ICD-10-CM

## 2020-04-12 NOTE — Progress Notes (Signed)
Virtual Visit via Telephone Note  I connected with [name] on 04/12/20 at  8:00 AM EST by telephone and verified that I am speaking with the correct person using two identifiers.  Location: Patient: home Provider: home office   I discussed the limitations, risks, security and privacy concerns of performing an evaluation and management service by telephone and the availability of in person appointments. I also discussed with the patient that there may be a patient responsible charge related to this service. The patient expressed understanding and agreed to proceed,   I discussed the assessment and treatment plan with the patient. The patient was provided an opportunity to ask questions and all were answered. The patient agreed with the plan and demonstrated an understanding of the instructions.   The patient was advised to call back or seek an in-person evaluation if the symptoms worsen or if the condition fails to improve as anticipated.  I provided 52 minutes of non-face-to-face time during this encounter.  THERAPIST PROGRESS NOTE  Session Time: 8:00 AM to 8:52 AM  Participation Level: Active  Behavioral Response: CasualAlertappropriate  Type of Therapy: Individual Therapy  Treatment Goals addressed:  Decrease in depression, anxiety, stress management, coping Interventions: Solution Focused, Strength-based, Supportive and Other: coping  Summary: [name] is a 60 y.o. female who presents with sharing with therapist things she enjoys that are also good for brain. (Discussed getting older and good to have activities that are good for brain) She likes to color and not doing that as much reading a lot more lately. Talked about what she is reading and enjoying it. Read them as a teenager and really liked them. Kind of escapism get into the lives of people in books. Agrees it helps and escape from the humdrum of the day. Noticed when reading learning even if fiction.  Discussed getting into stories on television that keep you busy when you don't feel like reading. Reviewed how she was doing slept some and hopes to get sleep after the call. Last couple of weeks focused on taxes. Is getting money back and something looking forward to it. Therapist pointed out there is money they can do something with. [name] actually bought her a bracelet and hasn't done that in a long time. Also bought roses for Valentine's Day. Explored how she felt and that it is nice to get personal gifts. "I couldn't believe it" and "I was so happy". "Made my day and still looking at it." "It is the thought" that causes her to feel good.      Therapist reviewed symptoms, facilitated expression of thoughts and feelings to help with coping.  Focused on positive coping strategies for patient and felt helpful for patient to share more about that including reading and what she is reading assess helps improve mood.  Noted reasons that helps in escapism, helps with learning, helps ourr brain, there are many positive bandages to reading.  Also discussed how it shows can be a form of escapism and that also helps with mood.  In session also focused on some positive events such as thoughtfulness of her husband and giving her gifts and also getting money back that she can use for some purpose after doing her taxes.  Therapist provided active listening, open questions, supportive interventions Suicidal/Homicidal: No  Plan: Return again in 2 weeks.2.Therapist utilize treatment interventions to help with depression, coping  Diagnosis: Axis I:  major depressive disorder, recurrent, moderate, degenerative disc disease, chronic pain syndrome, generalized anxiety  disorder     Axis II: No diagnosis      [name], LCSW 04/12/2020

## 2020-04-13 ENCOUNTER — Encounter: Payer: Self-pay | Admitting: Registered Nurse

## 2020-04-13 ENCOUNTER — Other Ambulatory Visit: Payer: Self-pay

## 2020-04-13 ENCOUNTER — Encounter: Payer: Medicare Other | Attending: Physical Medicine and Rehabilitation | Admitting: Registered Nurse

## 2020-04-13 VITALS — BP 138/82 | HR 98 | Temp 98.2°F | Ht 68.0 in | Wt 239.4 lb

## 2020-04-13 DIAGNOSIS — Z79891 Long term (current) use of opiate analgesic: Secondary | ICD-10-CM

## 2020-04-13 DIAGNOSIS — M47816 Spondylosis without myelopathy or radiculopathy, lumbar region: Secondary | ICD-10-CM

## 2020-04-13 DIAGNOSIS — G894 Chronic pain syndrome: Secondary | ICD-10-CM

## 2020-04-13 DIAGNOSIS — G43119 Migraine with aura, intractable, without status migrainosus: Secondary | ICD-10-CM

## 2020-04-13 DIAGNOSIS — M1712 Unilateral primary osteoarthritis, left knee: Secondary | ICD-10-CM | POA: Diagnosis not present

## 2020-04-13 DIAGNOSIS — F329 Major depressive disorder, single episode, unspecified: Secondary | ICD-10-CM

## 2020-04-13 DIAGNOSIS — G8929 Other chronic pain: Secondary | ICD-10-CM

## 2020-04-13 DIAGNOSIS — G9050 Complex regional pain syndrome I, unspecified: Secondary | ICD-10-CM | POA: Diagnosis not present

## 2020-04-13 DIAGNOSIS — M1711 Unilateral primary osteoarthritis, right knee: Secondary | ICD-10-CM | POA: Diagnosis not present

## 2020-04-13 DIAGNOSIS — Z79899 Other long term (current) drug therapy: Secondary | ICD-10-CM | POA: Diagnosis not present

## 2020-04-13 DIAGNOSIS — M546 Pain in thoracic spine: Secondary | ICD-10-CM

## 2020-04-13 DIAGNOSIS — Z5181 Encounter for therapeutic drug level monitoring: Secondary | ICD-10-CM

## 2020-04-13 MED ORDER — MORPHINE SULFATE ER 60 MG PO TBCR: 60.0000 mg | EXTENDED_RELEASE_TABLET | Freq: Two times a day (BID) | ORAL | 0 refills | Status: DC

## 2020-04-13 MED ORDER — MORPHINE SULFATE ER 30 MG PO TBCR
30.0000 mg | EXTENDED_RELEASE_TABLET | Freq: Two times a day (BID) | ORAL | 0 refills | Status: DC
Start: 2020-04-13 — End: 2020-05-13

## 2020-04-13 MED ORDER — MORPHINE SULFATE 15 MG PO TABS: 15.0000 mg | ORAL_TABLET | Freq: Four times a day (QID) | ORAL | 0 refills | Status: DC | PRN

## 2020-04-13 NOTE — Progress Notes (Signed)
Subjective:    Patient ID: [name], female    DOB: [birth year], 60 y.o.   MRN: [id number]  HPI: [name] is a 60 y.o. female who returns for follow up appointment for chronic pain and medication refill. She states her pain is located in her mid- back mainly left side, lower back and bilateral knee pain R>L. She rates her pain 8. Her current exercise regime is walking and performing stretching exercises.  [name] Morphine equivalent is 244.50 MME.  UDS ordered today.    Pain Inventory Average Pain 9 Pain Right Now 8 My pain is constant, sharp, burning, dull, stabbing, tingling and aching  In the last 24 hours, has pain interfered with the following? General activity 10 Relation with others 10 Enjoyment of life 10 What TIME of day is your pain at its worst? morning , daytime, evening and night Sleep (in general) Poor  Pain is worse with: walking, bending, standing and some activites Pain improves with: rest, heat/ice, medication, TENS and injections Relief from Meds: 6  Family History  Problem Relation Age of Onset  . Depression Mother   . Hypertension Mother   . Hypertension Father   . Heart failure Father   . Diabetes Father   . Heart attack Father   . Colon cancer Neg Hx   . Esophageal cancer Neg Hx   . Stomach cancer Neg Hx   . Rectal cancer Neg Hx    Social History   Socioeconomic History  . Marital status: Married    Spouse name: [name]  . Number of children: 1  . Years of education: some college  . Highest education level: Not on file  Occupational History  . Occupation: On disability    Employer: UNEMPLOYED  Tobacco Use  . Smoking status: Former Smoker    Packs/day: 0.50    Types: Cigarettes    Quit date: 02/06/2016    Years since quitting: 4.1  . Smokeless tobacco: Never Used  . Tobacco comment: going to try e-sticks to quit  Substance and Sexual Activity  . Alcohol use: No    Alcohol/week: 0.0 standard drinks  . Drug  use: No  . Sexual activity: Not Currently    Partners: Male  Other Topics Concern  . Not on file  Social History Narrative   8-10 cups of soda a day.  On disability. No regular exercise.     Social Determinants of Health   Financial Resource Strain: Not on file  Food Insecurity: Not on file  Transportation Needs: Not on file  Physical Activity: Not on file  Stress: Not on file  Social Connections: Not on file   Past Surgical History:  Procedure Laterality Date  . ABDOMINAL HYSTERECTOMY  May 2004  . Basel Cell Carcinoma  06/2005, 07/2005   nasal tip and reconstruction  . BREAST CYST ASPIRATION    . CARPAL TUNNEL RELEASE  07/1993   both hands  . COLONOSCOPY    . HEMORROIDECTOMY  July 2003  . KNEE ARTHROPLASTY  09/2001, 05/2003   left knee, chondromalasia  . KNEE ARTHROSCOPY  jan 2005   Right knee, tisse release, chrondromalacia  . REPLACEMENT TOTAL KNEE  Nov 2005   Left   . THYROID LOBECTOMY  01/2005   malignant area removed from right lobe  . THYROID SURGERY     thyroid lobe removal  . TONSILLECTOMY  09/1972  . TUBAL LIGATION  Sept 1999  . Ulnar nerve entrapment  Feb 1995  left elbow   Past Surgical History:  Procedure Laterality Date  . ABDOMINAL HYSTERECTOMY  May 2004  . Basel Cell Carcinoma  06/2005, 07/2005   nasal tip and reconstruction  . BREAST CYST ASPIRATION    . CARPAL TUNNEL RELEASE  07/1993   both hands  . COLONOSCOPY    . HEMORROIDECTOMY  July 2003  . KNEE ARTHROPLASTY  09/2001, 05/2003   left knee, chondromalasia  . KNEE ARTHROSCOPY  jan 2005   Right knee, tisse release, chrondromalacia  . REPLACEMENT TOTAL KNEE  Nov 2005   Left   . THYROID LOBECTOMY  01/2005   malignant area removed from right lobe  . THYROID SURGERY     thyroid lobe removal  . TONSILLECTOMY  09/1972  . TUBAL LIGATION  Sept 1999  . Ulnar nerve entrapment  Feb 1995   left elbow   Past Medical History:  Diagnosis Date  . Allergy   . Anxiety   . Cancer ([place])    cancerous nodules  on thyroid  . Cancer ([place])    basal cell Cancer-nose  . Chronic pain syndrome   . COPD (chronic obstructive pulmonary disease) ([place])   . Degeneration of lumbar or lumbosacral intervertebral disc   . Depression   . Facet syndrome, lumbar   . GERD (gastroesophageal reflux disease)   . Hyperlipidemia   . Intervertebral lumbar disc disorder with myelopathy, lumbar region   . Lumbosacral spondylosis without myelopathy   . Migraine without aura, with intractable migraine, so stated, without mention of status migrainosus   . Primary localized osteoarthrosis, lower leg   . Thyroid disease    hypothyroid  . Unspecified musculoskeletal disorders and symptoms referable to neck    cervical/trapezius   BP 138/82   Pulse 98   Temp 98.2 F (36.8 C)   Ht 5\' 8"  (1.727 m)   Wt 239 lb 6.4 oz (108.6 kg)   SpO2 95%   BMI 36.40 kg/m   Opioid Risk Score:   Fall Risk Score:  `1  Depression screen PHQ 2/9  Depression screen [facility] 2/9 04/13/2020 02/12/2020 01/21/2020 12/22/2019 08/29/2019 08/26/2019 08/01/2019  Decreased Interest 2 2 1 2  0 2 3  Down, Depressed, Hopeless 2 2 1 2  0 2 3  PHQ - 2 Score 4 4 2 4  0 4 6  Altered sleeping - - - - - 3 -  Tired, decreased energy - - - - - 3 -  Change in appetite - - - - - 3 -  Feeling bad or failure about yourself  - - - - - 3 -  Trouble concentrating - - - - - 1 -  Moving slowly or fidgety/restless - - - - - 0 -  Suicidal thoughts - - - - - 0 -  PHQ-9 Score - - - - - 17 -  Difficult doing work/chores - - - - - Extremely dIfficult -  Some encounter information is confidential and restricted. Go to Review Flowsheets activity to see all data.  Some recent data might be hidden   Review of Systems  Constitutional: Negative.   HENT: Negative.   Eyes: Negative.   Respiratory: Negative.   Cardiovascular: Negative.   Gastrointestinal: Negative.   Endocrine: Negative.   Genitourinary: Negative.   Musculoskeletal: Positive for arthralgias, back pain, gait problem  and joint swelling.  Skin: Negative.   Allergic/Immunologic: Negative.   Neurological:       Tingling  Hematological: Negative.   Psychiatric/Behavioral: Positive for dysphoric mood.  All other systems reviewed and are negative.      Objective:   Physical Exam Vitals and nursing note reviewed.  Constitutional:      Appearance: Normal appearance.  Cardiovascular:     Rate and Rhythm: Normal rate and regular rhythm.     Pulses: Normal pulses.     Heart sounds: Normal heart sounds.  Pulmonary:     Effort: Pulmonary effort is normal.     Breath sounds: Normal breath sounds.  Musculoskeletal:     Cervical back: Normal range of motion and neck supple.     Comments: Normal Muscle Bulk and Muscle Testing Reveals:  Upper Extremities: Full ROM and Muscle Strength 5/5 Thoracic Paraspinal Tenderness: T- 3- T-4 Mainly Left Side Lumbar Paraspinal Tenderness: L-4-L-5 Lower Extremities: Decreased ROM and Muscle Strength 4/5 Bilateral Lower Extremities Flexion Produces Pain into her Bilateral Patella's.  Arises from Table slowly using cane for support Antalgic Gait   Skin:    General: Skin is warm and dry.  Neurological:     Mental Status: She is alert and oriented to person, place, and time.  Psychiatric:        Mood and Affect: Mood normal.        Behavior: Behavior normal.           Assessment & Plan:  1.Chronic Bilateral Thoracic Back Pain/ Low Back Pain/ Lumbar Facet Arthropathy:RefilledMSIR15mg  one tablet every 6 hoursas needed #105andContinue withSlow weaning ofMorphine. Refilled:MS Contin 60 mg and 30 mg Q 12 hours to equal 90 mg #60.Continue Gabapentin and Pamelor.04/13/2020 We will continue the opioid monitoring program, this consists of regular clinic visits, examinations, urine drug screen, pill counts as well as use of [place] Controlled Substance Reporting system. A 12 month History has been reviewed on the [place] Controlled Substance  Reporting Systemon03/09/2020. 2. Degenerative Disk Disease:Encouraged Continue HEP as Tolertatedand heat therapy. Continue Current Medication Regime.04/13/2020 3.Bilateral Greater Trochanteric Bursitis:No complaints today.Continuecurrent treatment regimenwith Heat and Ice Therapy.04/13/2020 4. Osteoarthritis of Bilateral Knees:R>L Continue current treatment modality with homeexerciseprogramand heat therapy.04/13/2020 5. Migraine Headaches:Continue withMigraine Journal. Aimovigdiscontinued due to Hives. ContinueLamictalandMaxalt. Continue to Monitor.04/13/2020 6. Smoking Cessation: She has quit smokingsince 08/25/2016. Continue to Monitor.04/13/2020 7.Constipation: ContinueCurrent medication regime withLinzess.04/13/2020 8. Muscle Spasm: Continuecurrent medication regime withTizanidine.04/13/2020 9. DepressionAnxiety/ Panic Attacks: Continuecurrent medication regimen and treatment modality.Celexa.Lamictaland Counseling with [name] andDr.Akhtar Psychiatrist.04/13/2020 10. RSD: Continue with current medication Regimewith Gabapentin.04/13/2020  F/U in 1 month

## 2020-04-22 LAB — TOXASSURE SELECT,+ANTIDEPR,UR

## 2020-04-23 ENCOUNTER — Telehealth: Payer: Self-pay | Admitting: *Deleted

## 2020-04-23 NOTE — Telephone Encounter (Signed)
Urine drug screen for this encounter is consistent for prescribed medication 

## 2020-04-26 ENCOUNTER — Ambulatory Visit (INDEPENDENT_AMBULATORY_CARE_PROVIDER_SITE_OTHER): Payer: Medicare Other | Admitting: Licensed Clinical Social Worker

## 2020-04-26 DIAGNOSIS — G894 Chronic pain syndrome: Secondary | ICD-10-CM

## 2020-04-26 DIAGNOSIS — F411 Generalized anxiety disorder: Secondary | ICD-10-CM

## 2020-04-26 DIAGNOSIS — M5136 Other intervertebral disc degeneration, lumbar region: Secondary | ICD-10-CM

## 2020-04-26 DIAGNOSIS — F331 Major depressive disorder, recurrent, moderate: Secondary | ICD-10-CM | POA: Diagnosis not present

## 2020-04-26 NOTE — Progress Notes (Signed)
Virtual Visit via Telephone Note  I connected with [name] on 04/26/20 at  8:00 AM EDT by telephone and verified that I am speaking with the correct person using two identifiers.  Location: Patient: home Provider: home office   I discussed the limitations, risks, security and privacy concerns of performing an evaluation and management service by telephone and the availability of in person appointments. I also discussed with the patient that there may be a patient responsible charge related to this service. The patient expressed understanding and agreed to proceed.   I discussed the assessment and treatment plan with the patient. The patient was provided an opportunity to ask questions and all were answered. The patient agreed with the plan and demonstrated an understanding of the instructions.   The patient was advised to call back or seek an in-person evaluation if the symptoms worsen or if the condition fails to improve as anticipated.  I provided 53 minutes of non-face-to-face time during this encounter.  THERAPIST PROGRESS NOTE  Session Time: 8:00 AM to 8:53 AM  Participation Level: Active  Behavioral Response: CasualAlertAnxious, euthymic session  Type of Therapy: Individual Therapy  Treatment Goals addressed:   Decrease in depression, anxiety, stress management, coping Interventions: Solution Focused, Strength-based, Supportive, Reframing and Other: coping  Summary: [name] is a 60 y.o. female who presents with birthday yesterday and ordered out from [employer]. Doesn't go many places and there is upside to it saves money on gas. Doesn't mind being a home. Other people "would chomping at the bit." To go anywhere is an effort wash hair, find something to wear plus pain issues. There are going to make it rougher with taking more pills away. Used to having 120 pills and cut down to105 and will cut down next time. Trying to take them further apart. Was  taking them every 4 hours and now every 8 or 9 hours. The longer she can go and stretch it out but it is hard. To deal with the pain that comes at a certain time the body is accustomed to getting help at a certain time. Gets dope sick. Would like them to stop decreasing because pain increasing. Looking at supplements and ordered CBD oil. Try one thing at a time and hopefully find something to work. The hard one is the extended release from 90 mg for 12 hours to 75 and that is what helps the most. Immediate release trying to stretch out. Knows she is scared to death with the pain she has to deal with it. Tells her of other people who have cut down and running around like haven't had in years and patient doesn't believe that. Back, knees, hip, left shoulder blade(that burns like a cigarette lighter holding in there) all hurt. Government is making them cut back. Understand that but think make allowances for people who can't take other stuff. Not doctor shopping count her pills and get drug tested about every 4 months. Always pass. Discussed previous surgeries having cancer and that being scary, on the thyroid, fibroids, skin cancer. Discussed developments and they can do more than they used to be able to do.  Discussed knowing people who we lost at a young age but now they are being more treatments so the same chances of having a positive outcome with different diagnoses  Therapist reviewed symptoms, facilitated expression of thoughts and feelings and significant intervention today was processing feelings related to pain issues and expectation next month of dropping pills further  in anticipating pain so fearful.  Therapy was a chance for patient to share how she felt about the process that she prefer not for them to drop at that it is hard when people have messed it up for other people by not taking them the way they should.  Therapist assesses is positive patient looking for alternatives to help with pain though I  think this shows maintaining positive perspective.  Therapist encouraged her with looking for things that could help her.  Reviewed her birthday to focus on positive events in her life, continues to use app [employer] that gives her support and suggestions for various issues and this is positive.  Therapist provided active listening, open questions, supportive interventions. Suicidal/Homicidal: No  Plan: Return again in 2 weeks.2.Therapist utilize treatment interventions to help with depression, coping, anxiety  Diagnosis: Axis I:  major depressive disorder, recurrent, moderate, degenerative disc disease, chronic pain syndrome, generalized anxiety disorder    Axis II: No diagnosis    [name], LCSW 04/26/2020

## 2020-04-30 ENCOUNTER — Ambulatory Visit (INDEPENDENT_AMBULATORY_CARE_PROVIDER_SITE_OTHER): Payer: Medicare Other | Admitting: Family Medicine

## 2020-04-30 DIAGNOSIS — Z0000 Encounter for general adult medical examination without abnormal findings: Secondary | ICD-10-CM | POA: Diagnosis not present

## 2020-04-30 DIAGNOSIS — Z1231 Encounter for screening mammogram for malignant neoplasm of breast: Secondary | ICD-10-CM

## 2020-04-30 NOTE — Patient Instructions (Addendum)
  [facility] Maintenance Summary and Written Plan of Care  [name] ,  Thank you for allowing me to perform your Medicare Annual Wellness Visit and for your ongoing commitment to your health.   Health Maintenance & Immunization History Health Maintenance  Topic Date Due  . INFLUENZA VACCINE  06/16/2020 (Originally 09/07/2019)  . COVID-19 Vaccine (1) 09/10/2020 (Originally 04/26/1970)  . MAMMOGRAM  04/30/2021 (Originally 11/08/2019)  . TETANUS/TDAP  04/30/2021 (Originally 02/07/2016)  . HIV Screening  04/30/2021 (Originally 04/25/1973)  . COLONOSCOPY (Pts 45-61yrs Insurance coverage will need to be confirmed)  04/25/2021  . Hepatitis C Screening  Completed  . HPV VACCINES  Aged Out   Immunization History  Administered Date(s) Administered  . Pneumococcal Polysaccharide-23 04/20/2011    These are the patient goals that we discussed: Goals Addressed              This Visit's Progress   .  Patient Stated (pt-stated)        04/30/2020 AWV Goal: Fall Prevention  . Over the next year, patient will decrease their risk for falls by: o Using assistive devices, such as a cane or walker, as needed o Identifying fall risks within their home and correcting them by: - Removing throw rugs - Adding handrails to stairs or ramps - Removing clutter and keeping a clear pathway throughout the home - Increasing light, especially at night - Adding shower handles/bars - Raising toilet seat o Identifying potential personal risk factors for falls: - Medication side effects - Incontinence/urgency - Vestibular dysfunction - Hearing loss - Musculoskeletal disorders - Neurological disorders - Orthostatic hypotension          This is a list of Health Maintenance Items that are overdue or due now: Influenza vaccine Td vaccine Screening mammography  Orders/Referrals Placed Today: Orders Placed This Encounter  Procedures  . Mammogram 3D SCREEN BREAST BILATERAL     Standing Status:   Future    Standing Expiration Date:   04/30/2021    Scheduling Instructions:     Please call patient to schedule    Order Specific Question:   Reason for Exam (SYMPTOM  OR DIAGNOSIS REQUIRED)    Answer:   Breast cancer screen    Order Specific Question:   Preferred imaging location?    Answer:   [facility]   (Contact our referral department at [phone] if you have not spoken with someone about your referral appointment within the next 5 days)    Follow-up Plan . Follow-up with Hali Marry, MD as planned . Mammogram referral has been sent and they will call you to schedule. . Please follow up with your counselor about your depression score.  . Medicare wellness in one year.

## 2020-04-30 NOTE — Progress Notes (Signed)
MEDICARE ANNUAL WELLNESS VISIT  04/30/2020  Telephone Visit Disclaimer This Medicare AWV was conducted by telephone due to national recommendations for restrictions regarding the COVID-19 Pandemic (e.g. social distancing).  I verified, using two identifiers, that I am speaking with [name] or their authorized healthcare agent. I discussed the limitations, risks, security, and privacy concerns of performing an evaluation and management service by telephone and the potential availability of an in-person appointment in the future. The patient expressed understanding and agreed to proceed.  Location of Patient: Home Location of Provider (nurse):  In the office.  Subjective:    [name] is a 60 y.o. female patient of Morales, Frances Kocher, MD who had a Medicare Annual Wellness Visit today via telephone. [name] is Disabled and lives with their spouse. she has 1 child. she reports that she is socially active and does interact with friends/family regularly. she is minimally physically active and enjoys reading.  Patient Care Team: Frances Marry, MD as PCP - General (Family Medicine)  Advanced Directives 04/30/2020 10/25/2016 08/25/2016 07/14/2016 05/15/2016 05/15/2016 04/06/2016  Does Patient Have a Medical Advance Directive? Yes Yes Yes Yes Yes Yes No  Type of Paramedic of [place];Living will [place];Living will [place];Living will [place];Living will [place];Living will [place];Living will -  Does patient want to make changes to medical advance directive? No - Patient declined - - - - - -  Copy of [place] in Chart? No - copy requested No - copy requested No - copy requested No - copy requested - [facility] Utilization Over the Past 12 Months: # of hospitalizations or ER visits: 1 # of surgeries: 0  Review of  Systems    Patient reports that her overall health is unchanged compared to last year.  History obtained from chart review and the patient  Patient Reported Readings (BP, Pulse, CBG, Weight, etc) none  Pain Assessment Pain : No/denies pain     Current Medications & Allergies (verified) Allergies as of 04/30/2020      Reactions   Aspirin Other (See Comments)   Abdominal bleeding   Ibuprofen Swelling   Imitrex [sumatriptan Base] Other (See Comments)   Drops BP   Norvasc [amlodipine Besylate] Swelling   Nsaids Swelling   Tape    Skin blisters under surgical tape   Topamax [topiramate] Other (See Comments)   Hair loss      Medication List       Accurate as of April 30, 2020  8:40 AM. If you have any questions, ask your nurse or doctor.        Aimovig 140 MG/ML Soaj Generic drug: Erenumab-aooe   cefdinir 300 MG capsule Commonly known as: OMNICEF   ciclopirox 8 % solution Commonly known as: Penlac Apply topically at bedtime. Apply over nail and surrounding skin. Apply daily over previous coat. After seven (7) days, may remove with alcohol and continue cycle.   citalopram 40 MG tablet Commonly known as: CELEXA TAKE 1 TABLET(40 MG) BY MOUTH AT BEDTIME   gabapentin 800 MG tablet Commonly known as: NEURONTIN TAKE 1 TABLET(800 MG) BY MOUTH FOUR TIMES DAILY   lamoTRIgine 25 MG tablet Commonly known as: LAMICTAL TAKE 1 TABLET(25 MG) BY MOUTH DAILY   levothyroxine 25 MCG tablet Commonly known as: SYNTHROID TAKE 1 TABLET BY MOUTH DAILY BEFORE BREAKFAST   Linzess 145 MCG  Caps capsule Generic drug: linaclotide TAKE 1 CAPSULE(145 MCG) BY MOUTH DAILY   metFORMIN 500 MG 24 hr tablet Commonly known as: GLUCOPHAGE-XR Take 1 tablet by mouth daily with supper.   morphine 60 MG 12 hr tablet Commonly known as: MS CONTIN Take 1 tablet (60 mg total) by mouth every 12 (twelve) hours.   morphine 30 MG 12 hr tablet Commonly known as: MS CONTIN Take 1 tablet (30 mg total)  by mouth every 12 (twelve) hours.   morphine 15 MG tablet Commonly known as: MSIR Take 1 tablet (15 mg total) by mouth every 6 (six) hours as needed for severe pain.   nortriptyline 50 MG capsule Commonly known as: PAMELOR Take 2 capsules (100 mg total) by mouth at bedtime.   pravastatin 40 MG tablet Commonly known as: PRAVACHOL Take 1 tablet (40 mg total) by mouth daily. LAST REFILL MUST SCHEDULE AND KEEP APPOINTMENT LABS REQUIRED FOR REFILLS   promethazine 12.5 MG tablet Commonly known as: PHENERGAN TAKE 1 TABLET(12.5 MG) BY MOUTH EVERY 12 HOURS   ReliOn Confirm Glucose Monitor w/Device Kit by Does not apply route.   rizatriptan 10 MG tablet Commonly known as: MAXALT TAKE 1 TABLET BY MOUTH AS NEEDED MAY REPEAT AFTER 2 HOURS AS NEEDED   tiZANidine 2 MG tablet Commonly known as: ZANAFLEX TAKE 1 TABLET(2 MG) BY MOUTH THREE TIMES DAILY AS NEEDED FOR MUSCLE SPASMS   tolterodine 4 MG 24 hr capsule Commonly known as: DETROL LA TAKE 1 CAPSULE(4 MG) BY MOUTH DAILY       History (reviewed): Past Medical History:  Diagnosis Date  . Allergy   . Anxiety   . Cancer ([place])    cancerous nodules on thyroid  . Cancer ([place])    basal cell Cancer-nose  . Chronic pain syndrome   . COPD (chronic obstructive pulmonary disease) ([place])   . Degeneration of lumbar or lumbosacral intervertebral disc   . Depression   . Facet syndrome, lumbar   . GERD (gastroesophageal reflux disease)   . Hyperlipidemia   . Intervertebral lumbar disc disorder with myelopathy, lumbar region   . Lumbosacral spondylosis without myelopathy   . Migraine without aura, with intractable migraine, so stated, without mention of status migrainosus   . Primary localized osteoarthrosis, lower leg   . Thyroid disease    hypothyroid  . Unspecified musculoskeletal disorders and symptoms referable to neck    cervical/trapezius   Past Surgical History:  Procedure Laterality Date  . ABDOMINAL HYSTERECTOMY  May 2004  .  Basel Cell Carcinoma  06/2005, 07/2005   nasal tip and reconstruction  . BREAST CYST ASPIRATION    . CARPAL TUNNEL RELEASE  07/1993   both hands  . COLONOSCOPY    . HEMORROIDECTOMY  July 2003  . KNEE ARTHROPLASTY  09/2001, 05/2003   left knee, chondromalasia  . KNEE ARTHROSCOPY  jan 2005   Right knee, tisse release, chrondromalacia  . REPLACEMENT TOTAL KNEE  Nov 2005   Left   . THYROID LOBECTOMY  01/2005   malignant area removed from right lobe  . THYROID SURGERY     thyroid lobe removal  . TONSILLECTOMY  09/1972  . TUBAL LIGATION  Sept 1999  . Ulnar nerve entrapment  Feb 1995   left elbow   Family History  Problem Relation Age of Onset  . Depression Mother   . Hypertension Mother   . Hypertension Father   . Heart failure Father   . Diabetes Father   . Heart attack  Father   . Colon cancer Neg Hx   . Esophageal cancer Neg Hx   . Stomach cancer Neg Hx   . Rectal cancer Neg Hx    Social History   Socioeconomic History  . Marital status: Married    Spouse name: [name]  . Number of children: 1  . Years of education: some college  . Highest education level: 12th grade  Occupational History  . Occupation: On disability    Employer: UNEMPLOYED  Tobacco Use  . Smoking status: Former Smoker    Packs/day: 0.50    Types: Cigarettes    Quit date: 02/06/2016    Years since quitting: 4.2  . Smokeless tobacco: Never Used  Vaping Use  . Vaping Use: Not on file  Substance and Sexual Activity  . Alcohol use: No    Alcohol/week: 0.0 standard drinks  . Drug use: No  . Sexual activity: Not Currently    Partners: Male  Other Topics Concern  . Not on file  Social History Narrative   On disability. Lives with her husband. She has a adopted daughter. She likes to read in her free time. She does chair exercises for 30-45 mins everyday.   Social Determinants of Health   Financial Resource Strain: Low Risk   . Difficulty of Paying Living Expenses: Not hard at all  Food Insecurity:  No Food Insecurity  . Worried About Charity fundraiser in the Last Year: Never true  . Ran Out of Food in the Last Year: Never true  Transportation Needs: No Transportation Needs  . Lack of Transportation (Medical): No  . Lack of Transportation (Non-Medical): No  Physical Activity: Inactive  . Days of Exercise per Week: 0 days  . Minutes of Exercise per Session: 0 min  Stress: No Stress Concern Present  . Feeling of Stress : Not at all  Social Connections: Socially Isolated  . Frequency of Communication with Friends and Family: Never  . Frequency of Social Gatherings with Friends and Family: Once a week  . Attends Religious Services: Never  . Active Member of Clubs or Organizations: No  . Attends Archivist Meetings: Never  . Marital Status: Married    Activities of Daily Living In your present state of health, do you have any difficulty performing the following activities: 04/30/2020  Hearing? N  Vision? N  Difficulty concentrating or making decisions? Y  Comment has trouble concentrating reading at times or remembering what she read.  Walking or climbing stairs? Y  Comment has difficulty walking because of her knees, hips and her back pain.  Dressing or bathing? Y  Comment she uses a shower stool to bathe and dress her self.  Doing errands, shopping? Y  Comment her husband takes her.  Preparing Food and eating ? N  Using the Toilet? N  In the past six months, have you accidently leaked urine? Y  Comment has an overactive bladder  Do you have problems with loss of bowel control? N  Managing your Medications? N  Managing your Finances? N  Housekeeping or managing your Housekeeping? Y  Comment she has a cleaning lady  Some recent data might be hidden    Patient Education/ Literacy How often do you need to have someone help you when you read instructions, pamphlets, or other written materials from your doctor or pharmacy?: 1 - Never What is the last grade level  you completed in school?: 12th grade  Exercise Current Exercise Habits: Home exercise routine,  Type of exercise: Other - see comments (chair exercises), Time (Minutes): 45, Frequency (Times/Week): 7, Weekly Exercise (Minutes/Week): 315, Intensity: Mild, Exercise limited by: orthopedic condition(s)  Diet Patient reports consuming 2 meals a day and 0 snack(s) a day Patient reports that her primary diet is: Regular Patient reports that she does have regular access to food.   Depression Screen PHQ 2/9 Scores 04/30/2020 04/13/2020 02/12/2020 01/21/2020 12/22/2019 08/29/2019 08/26/2019  PHQ - 2 Score 6 4 4 2 4  0 4  PHQ- 9 Score 21 - - - - - 17  Some encounter information is confidential and restricted. Go to Review Flowsheets activity to see all data.   Patient's depression school was high. She stated that she has a Social worker and is currently on treatment and did not want any further interventions.   Fall Risk Fall Risk  04/30/2020 04/13/2020 03/16/2020 02/12/2020 01/21/2020  Falls in the past year? 0 0 0 0 1  Comment - - - new year 2022 -  Number falls in past yr: 0 - - - 0  Comment - - - - -  Injury with Fall? 0 - - - 1  Comment - - - - -  Risk Factor Category  Low Risk (0 Points) Low Risk (0 Points) - - Moderate Risk (1 Point)  Risk for fall due to : No Fall Risks Impaired balance/gait;History of fall(s) - History of fall(s);Impaired balance/gait History of fall(s);Impaired balance/gait  Follow up Falls evaluation completed - - - -     Objective:  [name] seemed alert and oriented and she participated appropriately during our telephone visit.  Blood Pressure Weight BMI  BP Readings from Last 3 Encounters:  04/13/20 138/82  03/16/20 (!) 144/79  02/12/20 104/68   Wt Readings from Last 3 Encounters:  04/13/20 239 lb 6.4 oz (108.6 kg)  03/16/20 250 lb (113.4 kg)  02/12/20 245 lb 6.4 oz (111.3 kg)   BMI Readings from Last 1 Encounters:  04/13/20 36.40 kg/m    *Unable to obtain  current vital signs, weight, and BMI due to telephone visit type  Hearing/Vision  . Tamieka did not seem to have difficulty with hearing/understanding during the telephone conversation . Reports that she has had a formal eye exam by an eye care professional within the past year . Reports that she has not had a formal hearing evaluation within the past year *Unable to fully assess hearing and vision during telephone visit type  Cognitive Function: 6CIT Screen 04/30/2020  What Year? 0 points  What month? 0 points  What time? 3 points  Count back from 20 0 points  Months in reverse 0 points  Repeat phrase 0 points  Total Score 3   (Normal:0-7, Significant for Dysfunction: >8)  Normal Cognitive Function Screening: Yes   Immunization & Health Maintenance Record Immunization History  Administered Date(s) Administered  . Pneumococcal Polysaccharide-23 04/20/2011    Health Maintenance  Topic Date Due  . INFLUENZA VACCINE  06/16/2020 (Originally 09/07/2019)  . COVID-19 Vaccine (1) 09/10/2020 (Originally [birth year]1970)  . MAMMOGRAM  04/30/2021 (Originally 11/08/2019)  . TETANUS/TDAP  04/30/2021 (Originally 02/07/2016)  . HIV Screening  04/30/2021 (Originally 04/25/1973)  . COLONOSCOPY (Pts 45-67yr Insurance coverage will need to be confirmed)  04/25/2021  . Hepatitis C Screening  Completed  . HPV VACCINES  Aged Out       Assessment  This is a routine wellness examination for CZISSEL BIEDERMAN  Health Maintenance: Due or Overdue There are no preventive care reminders  to display for this patient.  [name] does not need a referral for Community Assistance: Care Management:   no Social Work:    no Prescription Assistance:  no Nutrition/Diabetes Education:  no   Plan:  Personalized Goals Goals Addressed              This Visit's Progress   .  Patient Stated (pt-stated)        04/30/2020 AWV Goal: Fall Prevention  . Over the next year, patient will decrease  their risk for falls by: o Using assistive devices, such as a cane or walker, as needed o Identifying fall risks within their home and correcting them by: - Removing throw rugs - Adding handrails to stairs or ramps - Removing clutter and keeping a clear pathway throughout the home - Increasing light, especially at night - Adding shower handles/bars - Raising toilet seat o Identifying potential personal risk factors for falls: - Medication side effects - Incontinence/urgency - Vestibular dysfunction - Hearing loss - Musculoskeletal disorders - Neurological disorders - Orthostatic hypotension        Personalized Health Maintenance & Screening Recommendations  Influenza vaccine Td vaccine Screening mammography  Lung Cancer Screening Recommended: no (Low Dose CT Chest recommended if Age 3-80 years, 30 pack-year currently smoking OR have quit w/in past 15 years) Hepatitis C Screening recommended: no HIV Screening recommended: yes  Advanced Directives: Written information was not prepared per patient's request.  Referrals & Orders Orders Placed This Encounter  Procedures  . Mammogram 3D SCREEN BREAST BILATERAL    Follow-up Plan . Follow-up with Frances Marry, MD as planned . Mammogram referral has been sent and they will call you to schedule. . Please follow up with your counselor about your depression score.  . Medicare wellness in one year.   I have personally reviewed and noted the following in the patient's chart:   . Medical and social history . Use of alcohol, tobacco or illicit drugs  . Current medications and supplements . Functional ability and status . Nutritional status . Physical activity . Advanced directives . List of other physicians . Hospitalizations, surgeries, and ER visits in previous 12 months . Vitals . Screenings to include cognitive, depression, and falls . Referrals and appointments  In addition, I have reviewed and discussed  with [name] certain preventive protocols, quality metrics, and best practice recommendations. A written personalized care plan for preventive services as well as general preventive health recommendations is available and can be mailed to the patient at her request.      Tinnie Gens, RN  04/30/2020

## 2020-05-03 ENCOUNTER — Other Ambulatory Visit: Payer: Self-pay | Admitting: Registered Nurse

## 2020-05-11 ENCOUNTER — Ambulatory Visit (INDEPENDENT_AMBULATORY_CARE_PROVIDER_SITE_OTHER): Payer: Medicare Other | Admitting: Licensed Clinical Social Worker

## 2020-05-11 DIAGNOSIS — F411 Generalized anxiety disorder: Secondary | ICD-10-CM

## 2020-05-11 DIAGNOSIS — G894 Chronic pain syndrome: Secondary | ICD-10-CM | POA: Diagnosis not present

## 2020-05-11 DIAGNOSIS — M5136 Other intervertebral disc degeneration, lumbar region: Secondary | ICD-10-CM | POA: Diagnosis not present

## 2020-05-11 DIAGNOSIS — F331 Major depressive disorder, recurrent, moderate: Secondary | ICD-10-CM | POA: Diagnosis not present

## 2020-05-11 DIAGNOSIS — M51369 Other intervertebral disc degeneration, lumbar region without mention of lumbar back pain or lower extremity pain: Secondary | ICD-10-CM

## 2020-05-11 NOTE — Progress Notes (Signed)
Virtual Visit via Telephone Note  I connected with [name] on 05/11/20 at  8:00 AM EDT by telephone and verified that I am speaking with the correct person using two identifiers.  Location: Patient: home  Provider: home office   I discussed the limitations, risks, security and privacy concerns of performing an evaluation and management service by telephone and the availability of in person appointments. I also discussed with the patient that there may be a patient responsible charge related to this service. The patient expressed understanding and agreed to proceed.   I discussed the assessment and treatment plan with the patient. The patient was provided an opportunity to ask questions and all were answered. The patient agreed with the plan and demonstrated an understanding of the instructions.   The patient was advised to call back or seek an in-person evaluation if the symptoms worsen or if the condition fails to improve as anticipated.  I provided 53 minutes of non-face-to-face time during this encounter.   THERAPIST PROGRESS NOTE  Session Time: 8:00 AM to 8:53 AM  Participation Level: Active  Behavioral Response: CasualAlertDepressed  Type of Therapy: Individual Therapy  Treatment Goals addressed:   Decrease in depression, anxiety, stress management, coping Interventions: Solution Focused, Strength-based, Supportive and Other: coping  Summary: [name] is a 60 y.o. female who presents with doesn't feel good this morning up all night knees feel like bees stinging her and icey to the touch. Anything that touches her hurts even sheet. "A shity way to live" Doesn't happen all the time if so would go out of her mind, wouldn't be able to stand and only can take so much and thinks she would give up. Understand why people kill themselves. She relates wouldn't hurt herself wouldn't do that to [place]. Pain episode going on 4-5 days maybe 10 days. Always had it no  matter how much medicine they give her never have been out of pain, never remember not hurting. Knows there were time when didn't so long hardly knows what it feels not to have it. Sorry crying but hurting. Explored ways to relieve pain, and patient suggests unconsciousness hard though to sleep with the pain. Cutting back opiates looking forward to more pain taking away what relieves it. Ordered CBD oil tried three days and didn't tell nothing for her. Homo pathetic remedies Kaba.Kaba. Maybe do some research and see comes up with. Lately don't feel like being online go on when she had to. Haven't colored in several week. Talked about some of the shows she watches to distract, her husband enjoys the show Star Trek and watch again when bored. Patient likes Alden Benjamin and can watch again and again. Shared positive news that they are coming home [place] [employer] reassigned and coming back in August so family including, [name] and [name] coming back. Miss her buddy, [name], and without [name] don't know what would have done. Discussed her life being fuller with them near. Will come on three day weekends not as much as used to can't travel as much with big animals. Easier for them to go there at Christmas. Find herself talking about grandmother find out life is what they say it was going to be. Therapist pointed out that it is good for her to get out. Though her knee swell as big as soccer balls. By the time get there has to get feet elevated and take two days to come down. Discussed having a pet cheering her up. Sharing history of how they  got their Armed forces training and education officer, emotional support but also a rescue.         Therapist reviewed symptoms, facilitated expression of thoughts and feelings provided supportive intervention and distraction as patient is dealing with pain issues, also using humor as a way to help her feel better.  Noted some positives that are developing so patient can have a positive perspective that her adopted daughter and family  are coming back [place] to live and how that will be a really big positive change, also her attachment to her pet and how that makes her feel better keeps her going.  Noted her being able to visit them and therapist pointed out it would be good for her to get out.  Brainstormed about treatments for pain alternatives that she can look into and may help.  Therapist provided active listening, open questions, supportive interventions. Suicidal/Homicidal: No  Plan: Return again in 2 weeks.2.Therapist utilize treatment interventions to help with depression, coping, anxiety  Diagnosis: Axis I:   major depressive disorder, recurrent, moderate, degenerative disc disease, chronic pain syndrome, generalized anxiety disorder    Axis II: No diagnosis    [name], LCSW 05/11/2020

## 2020-05-13 ENCOUNTER — Encounter: Payer: Self-pay | Admitting: Registered Nurse

## 2020-05-13 ENCOUNTER — Other Ambulatory Visit: Payer: Self-pay

## 2020-05-13 ENCOUNTER — Ambulatory Visit: Payer: Medicare Other | Admitting: Registered Nurse

## 2020-05-13 ENCOUNTER — Encounter: Payer: Medicare Other | Attending: Physical Medicine and Rehabilitation | Admitting: Registered Nurse

## 2020-05-13 VITALS — BP 109/74 | HR 101 | Temp 99.0°F | Ht 68.0 in | Wt 232.0 lb

## 2020-05-13 DIAGNOSIS — F329 Major depressive disorder, single episode, unspecified: Secondary | ICD-10-CM | POA: Diagnosis present

## 2020-05-13 DIAGNOSIS — G894 Chronic pain syndrome: Secondary | ICD-10-CM | POA: Diagnosis not present

## 2020-05-13 DIAGNOSIS — G43119 Migraine with aura, intractable, without status migrainosus: Secondary | ICD-10-CM | POA: Insufficient documentation

## 2020-05-13 DIAGNOSIS — G9050 Complex regional pain syndrome I, unspecified: Secondary | ICD-10-CM | POA: Insufficient documentation

## 2020-05-13 DIAGNOSIS — M7061 Trochanteric bursitis, right hip: Secondary | ICD-10-CM | POA: Diagnosis not present

## 2020-05-13 DIAGNOSIS — M1712 Unilateral primary osteoarthritis, left knee: Secondary | ICD-10-CM | POA: Insufficient documentation

## 2020-05-13 DIAGNOSIS — M7062 Trochanteric bursitis, left hip: Secondary | ICD-10-CM | POA: Diagnosis not present

## 2020-05-13 DIAGNOSIS — G8929 Other chronic pain: Secondary | ICD-10-CM | POA: Insufficient documentation

## 2020-05-13 DIAGNOSIS — M546 Pain in thoracic spine: Secondary | ICD-10-CM | POA: Diagnosis not present

## 2020-05-13 DIAGNOSIS — M47816 Spondylosis without myelopathy or radiculopathy, lumbar region: Secondary | ICD-10-CM | POA: Diagnosis not present

## 2020-05-13 DIAGNOSIS — M1711 Unilateral primary osteoarthritis, right knee: Secondary | ICD-10-CM | POA: Diagnosis not present

## 2020-05-13 DIAGNOSIS — Z79899 Other long term (current) drug therapy: Secondary | ICD-10-CM

## 2020-05-13 DIAGNOSIS — Z5181 Encounter for therapeutic drug level monitoring: Secondary | ICD-10-CM | POA: Insufficient documentation

## 2020-05-13 MED ORDER — MORPHINE SULFATE 15 MG PO TABS: 15.0000 mg | ORAL_TABLET | Freq: Four times a day (QID) | ORAL | 0 refills | Status: DC | PRN

## 2020-05-13 MED ORDER — MORPHINE SULFATE ER 60 MG PO TBCR: 60.0000 mg | EXTENDED_RELEASE_TABLET | Freq: Two times a day (BID) | ORAL | 0 refills | Status: DC

## 2020-05-13 MED ORDER — MORPHINE SULFATE ER 30 MG PO TBCR: 30.0000 mg | EXTENDED_RELEASE_TABLET | Freq: Two times a day (BID) | ORAL | 0 refills | Status: DC

## 2020-05-13 MED ORDER — TIZANIDINE HCL 2 MG PO TABS: ORAL_TABLET | ORAL | 5 refills | Status: DC

## 2020-05-13 NOTE — Progress Notes (Signed)
Subjective:    Patient ID: [name], female    DOB: [birth year], 60 y.o.   MRN: [id number]  HPI: [name] is a 60 y.o. female who returns for follow up appointment for chronic pain and medication refill. She states her pain is located in her mid- lower back, bilateral hips and bilateral knee pain L>R. She rates her pain 9. Her  current exercise regime is walking and performing stretching exercises.  Ms. [place] Morphine equivalent is 232.50 MME.  Last UDS was Performed on 04/13/2020, it was consistent.    Pain Inventory Average Pain 8 Pain Right Now 9 My pain is constant, sharp, burning, dull, stabbing, tingling and aching  In the last 24 hours, has pain interfered with the following? General activity 10 Relation with others 10 Enjoyment of life 10 What TIME of day is your pain at its worst? morning , daytime, evening and night Sleep (in general) Poor  Pain is worse with: walking, bending, standing and some activites Pain improves with: rest, heat/ice, therapy/exercise, medication, TENS and injections Relief from Meds: 6  Family History  Problem Relation Age of Onset  . Depression Mother   . Hypertension Mother   . Hypertension Father   . Heart failure Father   . Diabetes Father   . Heart attack Father   . Colon cancer Neg Hx   . Esophageal cancer Neg Hx   . Stomach cancer Neg Hx   . Rectal cancer Neg Hx    Social History   Socioeconomic History  . Marital status: Married    Spouse name: [name]  . Number of children: 1  . Years of education: some college  . Highest education level: 12th grade  Occupational History  . Occupation: On disability    Employer: UNEMPLOYED  Tobacco Use  . Smoking status: Former Smoker    Packs/day: 0.50    Types: Cigarettes    Quit date: 02/06/2016    Years since quitting: 4.2  . Smokeless tobacco: Never Used  Vaping Use  . Vaping Use: Not on file  Substance and Sexual Activity  . Alcohol use: No     Alcohol/week: 0.0 standard drinks  . Drug use: No  . Sexual activity: Not Currently    Partners: Male  Other Topics Concern  . Not on file  Social History Narrative   On disability. Lives with her husband. She has a adopted daughter. She likes to read in her free time. She does chair exercises for 30-45 mins everyday.   Social Determinants of Health   Financial Resource Strain: Low Risk   . Difficulty of Paying Living Expenses: Not hard at all  Food Insecurity: No Food Insecurity  . Worried About Charity fundraiser in the Last Year: Never true  . Ran Out of Food in the Last Year: Never true  Transportation Needs: No Transportation Needs  . Lack of Transportation (Medical): No  . Lack of Transportation (Non-Medical): No  Physical Activity: Inactive  . Days of Exercise per Week: 0 days  . Minutes of Exercise per Session: 0 min  Stress: No Stress Concern Present  . Feeling of Stress : Not at all  Social Connections: Socially Isolated  . Frequency of Communication with Friends and Family: Never  . Frequency of Social Gatherings with Friends and Family: Once a week  . Attends Religious Services: Never  . Active Member of Clubs or Organizations: No  . Attends Archivist Meetings: Never  . Marital  Status: Married   Past Surgical History:  Procedure Laterality Date  . ABDOMINAL HYSTERECTOMY  May 2004  . Basel Cell Carcinoma  06/2005, 07/2005   nasal tip and reconstruction  . BREAST CYST ASPIRATION    . CARPAL TUNNEL RELEASE  07/1993   both hands  . COLONOSCOPY    . HEMORROIDECTOMY  July 2003  . KNEE ARTHROPLASTY  09/2001, 05/2003   left knee, chondromalasia  . KNEE ARTHROSCOPY  jan 2005   Right knee, tisse release, chrondromalacia  . REPLACEMENT TOTAL KNEE  Nov 2005   Left   . THYROID LOBECTOMY  01/2005   malignant area removed from right lobe  . THYROID SURGERY     thyroid lobe removal  . TONSILLECTOMY  09/1972  . TUBAL LIGATION  Sept 1999  . Ulnar nerve entrapment   Feb 1995   left elbow   Past Surgical History:  Procedure Laterality Date  . ABDOMINAL HYSTERECTOMY  May 2004  . Basel Cell Carcinoma  06/2005, 07/2005   nasal tip and reconstruction  . BREAST CYST ASPIRATION    . CARPAL TUNNEL RELEASE  07/1993   both hands  . COLONOSCOPY    . HEMORROIDECTOMY  July 2003  . KNEE ARTHROPLASTY  09/2001, 05/2003   left knee, chondromalasia  . KNEE ARTHROSCOPY  jan 2005   Right knee, tisse release, chrondromalacia  . REPLACEMENT TOTAL KNEE  Nov 2005   Left   . THYROID LOBECTOMY  01/2005   malignant area removed from right lobe  . THYROID SURGERY     thyroid lobe removal  . TONSILLECTOMY  09/1972  . TUBAL LIGATION  Sept 1999  . Ulnar nerve entrapment  Feb 1995   left elbow   Past Medical History:  Diagnosis Date  . Allergy   . Anxiety   . Cancer ([place])    cancerous nodules on thyroid  . Cancer ([place])    basal cell Cancer-nose  . Chronic pain syndrome   . COPD (chronic obstructive pulmonary disease) ([place])   . Degeneration of lumbar or lumbosacral intervertebral disc   . Depression   . Facet syndrome, lumbar   . GERD (gastroesophageal reflux disease)   . Hyperlipidemia   . Intervertebral lumbar disc disorder with myelopathy, lumbar region   . Lumbosacral spondylosis without myelopathy   . Migraine without aura, with intractable migraine, so stated, without mention of status migrainosus   . Primary localized osteoarthrosis, lower leg   . Thyroid disease    hypothyroid  . Unspecified musculoskeletal disorders and symptoms referable to neck    cervical/trapezius   There were no vitals taken for this visit.  Opioid Risk Score:   Fall Risk Score:  `1  Depression screen PHQ 2/9  Depression screen [facility] 2/9 04/30/2020 04/13/2020 02/12/2020 01/21/2020 12/22/2019 08/29/2019 08/26/2019  Decreased Interest 3 2 2 1 2  0 2  Down, Depressed, Hopeless 3 2 2 1 2  0 2  PHQ - 2 Score 6 4 4 2 4  0 4  Altered sleeping 3 - - - - - 3  Tired, decreased energy 3 - - - -  - 3  Change in appetite 3 - - - - - 3  Feeling bad or failure about yourself  3 - - - - - 3  Trouble concentrating 2 - - - - - 1  Moving slowly or fidgety/restless 1 - - - - - 0  Suicidal thoughts 0 - - - - - 0  PHQ-9 Score 21 - - - - -  17  Difficult doing work/chores Extremely dIfficult - - - - - Extremely dIfficult  Some encounter information is confidential and restricted. Go to Review Flowsheets activity to see all data.  Some recent data might be hidden    Review of Systems  Constitutional: Negative.   HENT: Negative.   Eyes: Negative.   Respiratory: Negative.   Cardiovascular: Negative.   Gastrointestinal: Negative.   Endocrine: Negative.   Genitourinary: Negative.   Musculoskeletal: Positive for arthralgias, back pain, myalgias, neck pain and neck stiffness.  Skin: Negative.   Allergic/Immunologic: Negative.   Neurological: Positive for weakness and numbness.       Tingling  Hematological: Negative.   Psychiatric/Behavioral: Positive for dysphoric mood.  All other systems reviewed and are negative.      Objective:   Physical Exam Vitals and nursing note reviewed.  Constitutional:      Appearance: Normal appearance.  Cardiovascular:     Rate and Rhythm: Normal rate and regular rhythm.     Pulses: Normal pulses.     Heart sounds: Normal heart sounds.  Pulmonary:     Effort: Pulmonary effort is normal.     Breath sounds: Normal breath sounds.  Musculoskeletal:     Cervical back: Normal range of motion and neck supple.     Comments: Normal Muscle Bulk and Muscle Testing Reveals:  Upper Extremities: Full ROM and Muscle Strength 5/5 Thoracic Paraspinal Tenderness: T-7-T-9 Mainly Left Side Lumbar Hypersensitivity Lower Extremities: Decreased ROM and Muscle Strength 5/5 Bilateral Lower Extremities Flexion Produces Pain into Her Lower Extremities and Bilateral Patella's Arises from Table Slowly Antalgic  Gait   Skin:    General: Skin is warm and dry.   Neurological:     Mental Status: She is alert and oriented to person, place, and time.  Psychiatric:        Mood and Affect: Mood normal.        Behavior: Behavior normal.           Assessment & Plan:  1.Chronic Bilateral Thoracic Back Pain/ Low Back Pain/ Lumbar Facet Arthropathy:RefilledMSIR15mg  one tablet every 6 hoursas needed #105andContinue withSlow weaning ofMorphine. Refilled:MS Contin 60 mg and 30 mg Q 12 hours to equal 90 mg #60.Continue Gabapentin and Pamelor.05/13/2020 We will continue the opioid monitoring program, this consists of regular clinic visits, examinations, urine drug screen, pill counts as well as use of [place] Controlled Substance Reporting system. A 12 month History has been reviewed on the [place] Controlled Substance Reporting Systemon04/08/2020. 2. Degenerative Disk Disease:Encouraged Continue HEP as Tolertatedand heat therapy. Continue Current Medication Regime.05/13/2020 3.Bilateral Greater Trochanteric Bursitis:Continuecurrent treatment regimenwith Heat and Ice Therapy.05/13/2020 4. Osteoarthritis of Bilateral Knees:R>L Continue current treatment modality with homeexerciseprogramand heat therapy.05/13/2020 5. Migraine Headaches:Continue withMigraine Journal. Aimovigdiscontinued due to Hives. ContinueLamictalandMaxalt. Continue to Monitor.05/13/2020 6. Smoking Cessation: She has quit smokingsince 08/25/2016. Continue to Monitor.05/13/2020 7.Constipation: ContinueCurrent medication regime withLinzess.05/13/2020 8. Muscle Spasm: Continuecurrent medication regime withTizanidine.05/13/2020 9. DepressionAnxiety/ Panic Attacks: Continuecurrent medication regimen and treatment modality.Celexa.Lamictaland Counseling with [name] andDr.Akhtar Psychiatrist.05/13/2020 10. RSD: Continue with current medication Regimewith Gabapentin.05/13/2020  F/U in 1 month

## 2020-05-24 ENCOUNTER — Ambulatory Visit (INDEPENDENT_AMBULATORY_CARE_PROVIDER_SITE_OTHER): Payer: Medicare Other | Admitting: Licensed Clinical Social Worker

## 2020-05-24 DIAGNOSIS — F331 Major depressive disorder, recurrent, moderate: Secondary | ICD-10-CM | POA: Diagnosis not present

## 2020-05-24 DIAGNOSIS — M5136 Other intervertebral disc degeneration, lumbar region: Secondary | ICD-10-CM

## 2020-05-24 DIAGNOSIS — G894 Chronic pain syndrome: Secondary | ICD-10-CM | POA: Diagnosis not present

## 2020-05-24 DIAGNOSIS — F411 Generalized anxiety disorder: Secondary | ICD-10-CM | POA: Diagnosis not present

## 2020-05-24 NOTE — Progress Notes (Signed)
Virtual Visit via Telephone Note  I connected with [name] on 05/24/20 at  8:00 AM EDT by telephone and verified that I am speaking with the correct person using two identifiers.  Location: Patient: home Provider: home office   I discussed the limitations, risks, security and privacy concerns of performing an evaluation and management service by telephone and the availability of in person appointments. I also discussed with the patient that there may be a patient responsible charge related to this service. The patient expressed understanding and agreed to proceed.    I discussed the assessment and treatment plan with the patient. The patient was provided an opportunity to ask questions and all were answered. The patient agreed with the plan and demonstrated an understanding of the instructions.   The patient was advised to call back or seek an in-person evaluation if the symptoms worsen or if the condition fails to improve as anticipated.  I provided 52 minutes of non-face-to-face time during this encounter.  THERAPIST PROGRESS NOTE  Session Time: 8:00 AM to 8:52 AM  Participation Level: Active  Behavioral Response: CasualAlertEuthymic  Type of Therapy: Individual Therapy  Treatment Goals addressed:  Decrease in depression, anxiety, stress management, coping Interventions: Solution Focused, Strength-based, Supportive and Other: coping  Summary: [name] is a 60 y.o. female who presents with talked about [place]. [place] and being [place]. The only one who was aggravating was mom who always have chores for them. Loved during yard work except the raking. Loved planting flowers. 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 Talked about [name] who cleans their house and "a good friend". Talked about having family around as nice to have and that patient  gets together on different occasions. Checked in today feel good this morning the rain makes her feel good. [name] has kept her entertained the last couple of days. Doesn't know how she got on without him. Discussed having a pet as really positive thing to have in life. Seem to know if you are down. Talked about [name] and her family coming back and how positive it will for patient. Something else happened that was positive recently. A friend, Lovey Newcomer, who is a lot like patient is back in her life. She saw patient on [employer] website. They got back together. Just went to lunch this Wednesday. Haven't been in touch for thirteen years. "It's been great." It was fun and had a great time talking. They are going to do that again. Someone who inspired her to get out of bed and go somewhere. Therapist pointed out that can help with depression.           Therapist reviewed symptoms, facilitated expression of thoughts and feelings, talked about positive memories from patient's past that therapist assesses helpful for mood.  Noted some positive developments for patient as well as aspects of her life that are helpful for her.  Noted a friend is back in touch will get patient out more which will be helpful therapist thinks for her depression, also social connection and doing activities together they enjoys helpful for mood.  Noted Selan coming back with her family is very positive, noted in her life her dog is very positive part of her life. Therapist guided active listening, open questions, supportive interventions Suicidal/Homicidal: No  Plan: Return again in 2 weeks.2.Therapist utilize treatment interventions to help  with depression, coping, anxiety  Diagnosis: Axis I: major depressive disorder, recurrent, moderate, degenerative disc disease, chronic pain syndrome, generalized anxiety disorder    Axis II: No diagnosis    [name], LCSW 05/24/2020

## 2020-06-07 ENCOUNTER — Ambulatory Visit (INDEPENDENT_AMBULATORY_CARE_PROVIDER_SITE_OTHER): Payer: Medicare Other | Admitting: Licensed Clinical Social Worker

## 2020-06-07 DIAGNOSIS — G894 Chronic pain syndrome: Secondary | ICD-10-CM | POA: Diagnosis not present

## 2020-06-07 DIAGNOSIS — F331 Major depressive disorder, recurrent, moderate: Secondary | ICD-10-CM

## 2020-06-07 DIAGNOSIS — M5136 Other intervertebral disc degeneration, lumbar region: Secondary | ICD-10-CM

## 2020-06-07 DIAGNOSIS — F411 Generalized anxiety disorder: Secondary | ICD-10-CM | POA: Diagnosis not present

## 2020-06-07 NOTE — Progress Notes (Signed)
Subjective:    Patient ID: [name], female    DOB: 1960-08-05, 60 y.o.   MRN: [id number]  HPI: [name] is a 60 y.o. female who returns for follow up appointment for chronic pain and medication refill. She states her pain is located in her mid- lower back pain and bilateral knee pain. She  rates her pain 9. Her current exercise regime is walking and performing stretching exercises.  Ms. [place] Morphine equivalent is 232.50MME.  Last UDS was Performed on 04/13/2020, it was was consistent   Pain Inventory Average Pain 8 Pain Right Now 9 My pain is sharp, burning, dull, stabbing, tingling and aching  In the last 24 hours, has pain interfered with the following? General activity 10 Relation with others 10 Enjoyment of life 10 What TIME of day is your pain at its worst? morning , daytime, evening and night Sleep (in general) Poor  Pain is worse with: walking, bending, standing and some activites Pain improves with: rest, heat/ice, therapy/exercise, pacing activities, medication, TENS and injections Relief from Meds: 6  Family History  Problem Relation Age of Onset  . Depression Mother   . Hypertension Mother   . Hypertension Father   . Heart failure Father   . Diabetes Father   . Heart attack Father   . Colon cancer Neg Hx   . Esophageal cancer Neg Hx   . Stomach cancer Neg Hx   . Rectal cancer Neg Hx    Social History   Socioeconomic History  . Marital status: Married    Spouse name: [name]  . Number of children: 1  . Years of education: some college  . Highest education level: 12th grade  Occupational History  . Occupation: On disability    Employer: UNEMPLOYED  Tobacco Use  . Smoking status: Former Smoker    Packs/day: 0.50    Types: Cigarettes    Quit date: 02/06/2016    Years since quitting: 4.3  . Smokeless tobacco: Never Used  Vaping Use  . Vaping Use: Not on file  Substance and Sexual Activity  . Alcohol use: No     Alcohol/week: 0.0 standard drinks  . Drug use: No  . Sexual activity: Not Currently    Partners: Male  Other Topics Concern  . Not on file  Social History Narrative   On disability. Lives with her husband. She has a adopted daughter. She likes to read in her free time. She does chair exercises for 30-45 mins everyday.   Social Determinants of Health   Financial Resource Strain: Low Risk   . Difficulty of Paying Living Expenses: Not hard at all  Food Insecurity: No Food Insecurity  . Worried About Charity fundraiser in the Last Year: Never true  . Ran Out of Food in the Last Year: Never true  Transportation Needs: No Transportation Needs  . Lack of Transportation (Medical): No  . Lack of Transportation (Non-Medical): No  Physical Activity: Inactive  . Days of Exercise per Week: 0 days  . Minutes of Exercise per Session: 0 min  Stress: No Stress Concern Present  . Feeling of Stress : Not at all  Social Connections: Socially Isolated  . Frequency of Communication with Friends and Family: Never  . Frequency of Social Gatherings with Friends and Family: Once a week  . Attends Religious Services: Never  . Active Member of Clubs or Organizations: No  . Attends Archivist Meetings: Never  . Marital Status: Married  Past Surgical History:  Procedure Laterality Date  . ABDOMINAL HYSTERECTOMY  May 2004  . Basel Cell Carcinoma  06/2005, 07/2005   nasal tip and reconstruction  . BREAST CYST ASPIRATION    . CARPAL TUNNEL RELEASE  07/1993   both hands  . COLONOSCOPY    . HEMORROIDECTOMY  July 2003  . KNEE ARTHROPLASTY  09/2001, 05/2003   left knee, chondromalasia  . KNEE ARTHROSCOPY  jan 2005   Right knee, tisse release, chrondromalacia  . REPLACEMENT TOTAL KNEE  Nov 2005   Left   . THYROID LOBECTOMY  01/2005   malignant area removed from right lobe  . THYROID SURGERY     thyroid lobe removal  . TONSILLECTOMY  09/1972  . TUBAL LIGATION  Sept 1999  . Ulnar nerve entrapment   Feb 1995   left elbow   Past Surgical History:  Procedure Laterality Date  . ABDOMINAL HYSTERECTOMY  May 2004  . Basel Cell Carcinoma  06/2005, 07/2005   nasal tip and reconstruction  . BREAST CYST ASPIRATION    . CARPAL TUNNEL RELEASE  07/1993   both hands  . COLONOSCOPY    . HEMORROIDECTOMY  July 2003  . KNEE ARTHROPLASTY  09/2001, 05/2003   left knee, chondromalasia  . KNEE ARTHROSCOPY  jan 2005   Right knee, tisse release, chrondromalacia  . REPLACEMENT TOTAL KNEE  Nov 2005   Left   . THYROID LOBECTOMY  01/2005   malignant area removed from right lobe  . THYROID SURGERY     thyroid lobe removal  . TONSILLECTOMY  09/1972  . TUBAL LIGATION  Sept 1999  . Ulnar nerve entrapment  Feb 1995   left elbow   Past Medical History:  Diagnosis Date  . Allergy   . Anxiety   . Cancer ([place])    cancerous nodules on thyroid  . Cancer ([place])    basal cell Cancer-nose  . Chronic pain syndrome   . COPD (chronic obstructive pulmonary disease) ([place])   . Degeneration of lumbar or lumbosacral intervertebral disc   . Depression   . Facet syndrome, lumbar   . GERD (gastroesophageal reflux disease)   . Hyperlipidemia   . Intervertebral lumbar disc disorder with myelopathy, lumbar region   . Lumbosacral spondylosis without myelopathy   . Migraine without aura, with intractable migraine, so stated, without mention of status migrainosus   . Primary localized osteoarthrosis, lower leg   . Thyroid disease    hypothyroid  . Unspecified musculoskeletal disorders and symptoms referable to neck    cervical/trapezius   BP 110/68   Pulse 95   Temp 98.9 F (37.2 C)   Ht 5\' 8"  (1.727 m)   Wt 235 lb (106.6 kg)   SpO2 92%   BMI 35.73 kg/m   Opioid Risk Score:   Fall Risk Score:  `1  Depression screen PHQ 2/9  Depression screen [facility] 2/9 04/30/2020 04/13/2020 02/12/2020 01/21/2020 12/22/2019 08/29/2019 08/26/2019  Decreased Interest 3 2 2 1 2  0 2  Down, Depressed, Hopeless 3 2 2 1 2  0 2  PHQ - 2 Score  6 4 4 2 4  0 4  Altered sleeping 3 - - - - - 3  Tired, decreased energy 3 - - - - - 3  Change in appetite 3 - - - - - 3  Feeling bad or failure about yourself  3 - - - - - 3  Trouble concentrating 2 - - - - - 1  Moving slowly or  fidgety/restless 1 - - - - - 0  Suicidal thoughts 0 - - - - - 0  PHQ-9 Score 21 - - - - - 17  Difficult doing work/chores Extremely dIfficult - - - - - Extremely dIfficult  Some encounter information is confidential and restricted. Go to Review Flowsheets activity to see all data.  Some recent data might be hidden    Review of Systems  Musculoskeletal: Positive for arthralgias, back pain, myalgias, neck pain and neck stiffness.  Neurological: Positive for weakness and numbness.       Tingling  Psychiatric/Behavioral: Positive for dysphoric mood.  All other systems reviewed and are negative.      Objective:   Physical Exam Vitals and nursing note reviewed.  Constitutional:      Appearance: Normal appearance.  HENT:     Head: Normocephalic and atraumatic.  Cardiovascular:     Rate and Rhythm: Normal rate and regular rhythm.     Pulses: Normal pulses.     Heart sounds: Normal heart sounds.  Pulmonary:     Effort: Pulmonary effort is normal.     Breath sounds: Normal breath sounds.  Musculoskeletal:     Cervical back: Normal range of motion and neck supple.     Comments: Normal Muscle Bulk and Muscle Testing Reveals:  Upper Extremities: Full ROM and Muscle Strength 5/5 Thoracic Paraspinal Tenderness: T-3-T-7 Mainly Left Side Lumbar Paraspinal Tenderness: L-4-L-5 Lower Extremities: Decreased ROM and Muscle Strength 5/5 Right Lower Extremity Flexion Produces Pain into her Right Patella Arises from Table slowly using cane for support Antalgic  Gait   Skin:    General: Skin is warm and dry.  Neurological:     Mental Status: She is alert and oriented to person, place, and time.  Psychiatric:        Mood and Affect: Mood normal.        Behavior:  Behavior normal.           Assessment & Plan:  1.Chronic Bilateral Thoracic Back Pain/ Low Back Pain/ Lumbar Facet Arthropathy:RefilledMSIR15mg  one tablet every 6 hoursas needed #105andContinue withSlow weaning ofMorphine. Refilled:MS Contin 60 mg and 30 mg Q 12 hours to equal 90 mg #60.Continue Gabapentin and Pamelor.06/08/2020 We will continue the opioid monitoring program, this consists of regular clinic visits, examinations, urine drug screen, pill counts as well as use of [place] Controlled Substance Reporting system. A 12 month History has been reviewed on the [place] Controlled Substance Reporting Systemon05/04/2020. 2. Degenerative Disk Disease:Encouraged Continue HEP as Tolertatedand heat therapy. Continue Current Medication Regime.05/13/2020 3.Bilateral Greater Trochanteric Bursitis:Continuecurrent treatment regimenwith Heat and Ice Therapy.06/08/2020 4. Osteoarthritis of Bilateral Knees:R>LContinue current treatment modality with homeexerciseprogramand heat therapy.06/08/2020 5. Migraine Headaches:Continue withMigraine Journal. Aimovigdiscontinued due to Hives. ContinueLamictalandMaxalt. Continue to Monitor.06/08/2020 6. Smoking Cessation: She has quit smokingsince 08/25/2016. Continue to Monitor.06/08/2020 7.Constipation: ContinueCurrent medication regime withLinzess.06/08/2020 8. Muscle Spasm: Continuecurrent medication regime withTizanidine.06/08/2020 9. DepressionAnxiety/ Panic Attacks: Continuecurrent medication regimen and treatment modality.Celexa.Lamictaland Counseling with [name] andDr.Akhtar Psychiatrist.06/08/2020 10. RSD: Continue with current medication Regimewith Gabapentin.06/08/2020  F/U in 1 month
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
## 2020-06-08 ENCOUNTER — Encounter: Payer: Self-pay | Admitting: Registered Nurse

## 2020-06-08 ENCOUNTER — Other Ambulatory Visit: Payer: Self-pay

## 2020-06-08 ENCOUNTER — Encounter: Payer: Medicare Other | Attending: Physical Medicine and Rehabilitation | Admitting: Registered Nurse

## 2020-06-08 VITALS — BP 110/68 | HR 95 | Temp 98.9°F | Ht 68.0 in | Wt 235.0 lb

## 2020-06-08 DIAGNOSIS — M47816 Spondylosis without myelopathy or radiculopathy, lumbar region: Secondary | ICD-10-CM | POA: Diagnosis not present

## 2020-06-08 DIAGNOSIS — G43119 Migraine with aura, intractable, without status migrainosus: Secondary | ICD-10-CM | POA: Insufficient documentation

## 2020-06-08 DIAGNOSIS — M546 Pain in thoracic spine: Secondary | ICD-10-CM | POA: Diagnosis not present

## 2020-06-08 DIAGNOSIS — M1712 Unilateral primary osteoarthritis, left knee: Secondary | ICD-10-CM | POA: Diagnosis not present

## 2020-06-08 DIAGNOSIS — F329 Major depressive disorder, single episode, unspecified: Secondary | ICD-10-CM | POA: Insufficient documentation

## 2020-06-08 DIAGNOSIS — G9050 Complex regional pain syndrome I, unspecified: Secondary | ICD-10-CM | POA: Insufficient documentation

## 2020-06-08 DIAGNOSIS — Z79899 Other long term (current) drug therapy: Secondary | ICD-10-CM | POA: Diagnosis not present

## 2020-06-08 DIAGNOSIS — G894 Chronic pain syndrome: Secondary | ICD-10-CM | POA: Diagnosis not present

## 2020-06-08 DIAGNOSIS — G8929 Other chronic pain: Secondary | ICD-10-CM | POA: Insufficient documentation

## 2020-06-08 DIAGNOSIS — M1711 Unilateral primary osteoarthritis, right knee: Secondary | ICD-10-CM | POA: Diagnosis not present

## 2020-06-08 DIAGNOSIS — Z5181 Encounter for therapeutic drug level monitoring: Secondary | ICD-10-CM | POA: Insufficient documentation

## 2020-06-08 MED ORDER — MORPHINE SULFATE ER 60 MG PO TBCR: 60.0000 mg | EXTENDED_RELEASE_TABLET | Freq: Two times a day (BID) | ORAL | 0 refills | Status: DC

## 2020-06-08 MED ORDER — RIZATRIPTAN BENZOATE 10 MG PO TABS: ORAL_TABLET | ORAL | 5 refills | Status: DC

## 2020-06-08 MED ORDER — MORPHINE SULFATE 15 MG PO TABS: 15.0000 mg | ORAL_TABLET | Freq: Four times a day (QID) | ORAL | 0 refills | Status: DC | PRN

## 2020-06-08 MED ORDER — LAMOTRIGINE 25 MG PO TABS: ORAL_TABLET | ORAL | 3 refills | Status: DC

## 2020-06-08 MED ORDER — MORPHINE SULFATE ER 30 MG PO TBCR: 30.0000 mg | EXTENDED_RELEASE_TABLET | Freq: Two times a day (BID) | ORAL | 0 refills | Status: DC

## 2020-06-08 MED ORDER — GABAPENTIN 800 MG PO TABS: ORAL_TABLET | ORAL | 5 refills | Status: DC

## 2020-06-11 ENCOUNTER — Encounter: Payer: Self-pay | Admitting: Registered Nurse

## 2020-06-21 ENCOUNTER — Ambulatory Visit (INDEPENDENT_AMBULATORY_CARE_PROVIDER_SITE_OTHER): Payer: Medicare Other | Admitting: Licensed Clinical Social Worker

## 2020-06-21 DIAGNOSIS — G894 Chronic pain syndrome: Secondary | ICD-10-CM

## 2020-06-21 DIAGNOSIS — M5136 Other intervertebral disc degeneration, lumbar region: Secondary | ICD-10-CM

## 2020-06-21 DIAGNOSIS — F411 Generalized anxiety disorder: Secondary | ICD-10-CM

## 2020-06-21 DIAGNOSIS — F331 Major depressive disorder, recurrent, moderate: Secondary | ICD-10-CM

## 2020-06-21 NOTE — Progress Notes (Signed)
Virtual Visit via Telephone Note  I connected with [name] on 06/21/20 at  8:00 AM EDT by telephone and verified that I am speaking with the correct person using two identifiers.  Location: Patient: home Provider: home office   I discussed the limitations, risks, security and privacy concerns of performing an evaluation and management service by telephone and the availability of in person appointments. I also discussed with the patient that there may be a patient responsible charge related to this service. The patient expressed understanding and agreed to proceed.   I discussed the assessment and treatment plan with the patient. The patient was provided an opportunity to ask questions and all were answered. The patient agreed with the plan and demonstrated an understanding of the instructions.   The patient was advised to call back or seek an in-person evaluation if the symptoms worsen or if the condition fails to improve as anticipated.  I provided 52 minutes of non-face-to-face time during this encounter.  THERAPIST PROGRESS NOTE  Session Time: 8:00 AM to 8:52 AM  Participation Level: Active  Behavioral Response: CasualAlertEuthymic  Type of Therapy: Individual Therapy  Treatment Goals addressed:  Decrease in depression, anxiety, stress management, coping Interventions: Solution Focused, Strength-based, Supportive and Other: coping  Summary: [name] is a 60 y.o. female who presents with making the bed this morning and exhausted-told by providers not supposed to do basic chores such as sweeping, mopping, exhausted from doing the chore. The person who usually comes in to help, [name], hasn't been there because she has medical issues. Discussed other things that can be challenging such as ordering for herself online as she doesn't go out very much once in awhile to siblings or to appointments.. Older sister, [name], called her last night, her niece, April, had  an ultrasound and she is going to have a girl. Worried when she calls because doesn't call a lot so thought it could be not good news but it was good news. Therapist pointed out important to appreciate the good news when it happens in our lives. With sister, [name], can talk all day, they grew up like twins only 13 months apart. They played al the time. Patient had the guts and she was the fearful one they complemented each other. Patient was the one in family who always got good grades. Patient shares that she thinks people can learn just learn at a different rate. Patient can learn faster like her Mom. [name] left at 87 and had her own room, the first time she had her own room and loved it. Shared about [place] expressions how people up [place] took at interest in her with her expressions from the [place], how they can be a source of humor and nice to be able to laugh about things, can make a session better. Patient checked in and not bad today. Haven't slept so silly.  Therapist shared her reaction that she enjoys talking to patient when she is silly.       Therapist reviewed symptoms, facilitated expression of thoughts and feelings, helpful intervention as patient processed through stressors that come about due to limitations with her medical issues and struggles that cost her doing simple chores, having to order online things because cannot get out.  Assess helpful for patient to have connection with therapist as supportive and strength-based because patient does not have as many connections because she is more isolated.  Processed through patient's history and assess helpful for elevation of mood as  she shares good memories, shares antidotes of her life but also add to positive mood.  Therapist provided active listening open questions, supportive interventions  Suicidal/Homicidal: No  Plan: Return again in 2 weeks.2.Therapist utilize treatment interventions to help with depression, coping,  anxiety  Diagnosis: Axis I:  major depressive disorder, recurrent, moderate, degenerative disc disease, chronic pain syndrome, generalized anxiety disorder    Axis II: No diagnosis    [name], LCSW 06/21/2020

## 2020-06-25 ENCOUNTER — Telehealth: Payer: Self-pay | Admitting: Family Medicine

## 2020-06-25 NOTE — Progress Notes (Signed)
  Chronic Care Management   Outreach Note  06/25/2020 Name: [name] MRN: [id number] DOB: 03/19/1960  Referred by: Hali Marry, MD Reason for referral : No chief complaint on file.   An unsuccessful telephone outreach was attempted today. The patient was referred to the pharmacist for assistance with care management and care coordination.   Follow Up Plan:   Lauretta Grill Upstream Scheduler

## 2020-07-01 ENCOUNTER — Other Ambulatory Visit: Payer: Self-pay | Admitting: Registered Nurse

## 2020-07-01 ENCOUNTER — Other Ambulatory Visit: Payer: Self-pay | Admitting: Physical Medicine & Rehabilitation

## 2020-07-01 ENCOUNTER — Other Ambulatory Visit: Payer: Self-pay | Admitting: Family Medicine

## 2020-07-07 ENCOUNTER — Encounter: Payer: Self-pay | Admitting: Registered Nurse

## 2020-07-07 ENCOUNTER — Other Ambulatory Visit: Payer: Self-pay

## 2020-07-07 ENCOUNTER — Encounter: Payer: Medicare Other | Attending: Physical Medicine and Rehabilitation | Admitting: Registered Nurse

## 2020-07-07 VITALS — BP 126/67 | HR 100 | Temp 98.9°F | Ht 68.0 in | Wt 229.2 lb

## 2020-07-07 DIAGNOSIS — G8929 Other chronic pain: Secondary | ICD-10-CM | POA: Diagnosis not present

## 2020-07-07 DIAGNOSIS — M1712 Unilateral primary osteoarthritis, left knee: Secondary | ICD-10-CM | POA: Insufficient documentation

## 2020-07-07 DIAGNOSIS — G43119 Migraine with aura, intractable, without status migrainosus: Secondary | ICD-10-CM | POA: Diagnosis not present

## 2020-07-07 DIAGNOSIS — M546 Pain in thoracic spine: Secondary | ICD-10-CM | POA: Insufficient documentation

## 2020-07-07 DIAGNOSIS — F329 Major depressive disorder, single episode, unspecified: Secondary | ICD-10-CM | POA: Diagnosis not present

## 2020-07-07 DIAGNOSIS — G894 Chronic pain syndrome: Secondary | ICD-10-CM | POA: Diagnosis not present

## 2020-07-07 DIAGNOSIS — G9050 Complex regional pain syndrome I, unspecified: Secondary | ICD-10-CM | POA: Insufficient documentation

## 2020-07-07 DIAGNOSIS — Z79899 Other long term (current) drug therapy: Secondary | ICD-10-CM | POA: Diagnosis not present

## 2020-07-07 DIAGNOSIS — Z5181 Encounter for therapeutic drug level monitoring: Secondary | ICD-10-CM | POA: Diagnosis not present

## 2020-07-07 DIAGNOSIS — M1711 Unilateral primary osteoarthritis, right knee: Secondary | ICD-10-CM | POA: Diagnosis not present

## 2020-07-07 DIAGNOSIS — M47816 Spondylosis without myelopathy or radiculopathy, lumbar region: Secondary | ICD-10-CM | POA: Insufficient documentation

## 2020-07-07 MED ORDER — MORPHINE SULFATE 15 MG PO TABS: 15.0000 mg | ORAL_TABLET | Freq: Four times a day (QID) | ORAL | 0 refills | Status: DC | PRN

## 2020-07-07 MED ORDER — MORPHINE SULFATE ER 60 MG PO TBCR: 60.0000 mg | EXTENDED_RELEASE_TABLET | Freq: Two times a day (BID) | ORAL | 0 refills | Status: DC

## 2020-07-07 MED ORDER — MORPHINE SULFATE ER 30 MG PO TBCR
30.0000 mg | EXTENDED_RELEASE_TABLET | Freq: Two times a day (BID) | ORAL | 0 refills | Status: DC
Start: 2020-07-07 — End: 2020-08-06

## 2020-07-07 NOTE — Progress Notes (Signed)
Subjective:    Patient ID: [name], female    DOB: May 24, 1960, 60 y.o.   MRN: [id number]  HPI: [name] is a 59 y.o. female who returns for follow up appointment for chronic pain and medication refill. She states her pain is located in  her mid- back mainly left side, lower back pain and bilateral knee pain. She rates her pain 8. Her  current exercise regime is walking and performing stretching exercises.  [name] Morphine equivalent is 232.50 MME.  Last UDS was Performed on 04/13/2020, it was consistent.   Pain Inventory Average Pain 8 Pain Right Now 8 My pain is constant, sharp, burning, dull, stabbing, tingling and aching  In the last 24 hours, has pain interfered with the following? General activity 10 Relation with others 10 Enjoyment of life 10 What TIME of day is your pain at its worst? morning , daytime, evening and night Sleep (in general) Poor  Pain is worse with: walking, bending, standing, unsure and some activites Pain improves with: rest, heat/ice, therapy/exercise, medication, TENS and injections Relief from Meds: 6  Family History  Problem Relation Age of Onset  . Depression Mother   . Hypertension Mother   . Hypertension Father   . Heart failure Father   . Diabetes Father   . Heart attack Father   . Colon cancer Neg Hx   . Esophageal cancer Neg Hx   . Stomach cancer Neg Hx   . Rectal cancer Neg Hx    Social History   Socioeconomic History  . Marital status: Married    Spouse name: [name]  . Number of children: 1  . Years of education: some college  . Highest education level: 12th grade  Occupational History  . Occupation: On disability    Employer: UNEMPLOYED  Tobacco Use  . Smoking status: Former Smoker    Packs/day: 0.50    Types: Cigarettes    Quit date: 02/06/2016    Years since quitting: 4.4  . Smokeless tobacco: Never Used  Vaping Use  . Vaping Use: Not on file  Substance and Sexual Activity  . Alcohol  use: No    Alcohol/week: 0.0 standard drinks  . Drug use: No  . Sexual activity: Not Currently    Partners: Male  Other Topics Concern  . Not on file  Social History Narrative   On disability. Lives with her husband. She has a adopted daughter. She likes to read in her free time. She does chair exercises for 30-45 mins everyday.   Social Determinants of Health   Financial Resource Strain: Low Risk   . Difficulty of Paying Living Expenses: Not hard at all  Food Insecurity: No Food Insecurity  . Worried About Charity fundraiser in the Last Year: Never true  . Ran Out of Food in the Last Year: Never true  Transportation Needs: No Transportation Needs  . Lack of Transportation (Medical): No  . Lack of Transportation (Non-Medical): No  Physical Activity: Inactive  . Days of Exercise per Week: 0 days  . Minutes of Exercise per Session: 0 min  Stress: No Stress Concern Present  . Feeling of Stress : Not at all  Social Connections: Socially Isolated  . Frequency of Communication with Friends and Family: Never  . Frequency of Social Gatherings with Friends and Family: Once a week  . Attends Religious Services: Never  . Active Member of Clubs or Organizations: No  . Attends Archivist Meetings: Never  .  Marital Status: Married   Past Surgical History:  Procedure Laterality Date  . ABDOMINAL HYSTERECTOMY  May 2004  . Basel Cell Carcinoma  06/2005, 07/2005   nasal tip and reconstruction  . BREAST CYST ASPIRATION    . CARPAL TUNNEL RELEASE  07/1993   both hands  . COLONOSCOPY    . HEMORROIDECTOMY  July 2003  . KNEE ARTHROPLASTY  09/2001, 05/2003   left knee, chondromalasia  . KNEE ARTHROSCOPY  jan 2005   Right knee, tisse release, chrondromalacia  . REPLACEMENT TOTAL KNEE  Nov 2005   Left   . THYROID LOBECTOMY  01/2005   malignant area removed from right lobe  . THYROID SURGERY     thyroid lobe removal  . TONSILLECTOMY  09/1972  . TUBAL LIGATION  Sept 1999  . Ulnar  nerve entrapment  Feb 1995   left elbow   Past Surgical History:  Procedure Laterality Date  . ABDOMINAL HYSTERECTOMY  May 2004  . Basel Cell Carcinoma  06/2005, 07/2005   nasal tip and reconstruction  . BREAST CYST ASPIRATION    . CARPAL TUNNEL RELEASE  07/1993   both hands  . COLONOSCOPY    . HEMORROIDECTOMY  July 2003  . KNEE ARTHROPLASTY  09/2001, 05/2003   left knee, chondromalasia  . KNEE ARTHROSCOPY  jan 2005   Right knee, tisse release, chrondromalacia  . REPLACEMENT TOTAL KNEE  Nov 2005   Left   . THYROID LOBECTOMY  01/2005   malignant area removed from right lobe  . THYROID SURGERY     thyroid lobe removal  . TONSILLECTOMY  09/1972  . TUBAL LIGATION  Sept 1999  . Ulnar nerve entrapment  Feb 1995   left elbow   Past Medical History:  Diagnosis Date  . Allergy   . Anxiety   . Cancer ([place])    cancerous nodules on thyroid  . Cancer ([place])    basal cell Cancer-nose  . Chronic pain syndrome   . COPD (chronic obstructive pulmonary disease) ([place])   . Degeneration of lumbar or lumbosacral intervertebral disc   . Depression   . Facet syndrome, lumbar   . GERD (gastroesophageal reflux disease)   . Hyperlipidemia   . Intervertebral lumbar disc disorder with myelopathy, lumbar region   . Lumbosacral spondylosis without myelopathy   . Migraine without aura, with intractable migraine, so stated, without mention of status migrainosus   . Primary localized osteoarthrosis, lower leg   . Thyroid disease    hypothyroid  . Unspecified musculoskeletal disorders and symptoms referable to neck    cervical/trapezius   BP 126/67   Pulse (!) 105   Temp 98.9 F (37.2 C)   Ht 5\' 8"  (1.727 m)   Wt 229 lb 3.2 oz (104 kg)   SpO2 93%   BMI 34.85 kg/m   Opioid Risk Score:   Fall Risk Score:  `1  Depression screen PHQ 2/9  Depression screen [facility] 2/9 07/07/2020 04/30/2020 04/13/2020 02/12/2020 01/21/2020 12/22/2019 08/29/2019  Decreased Interest 2 3 2 2 1 2  0  Down, Depressed, Hopeless 2 3  2 2 1 2  0  PHQ - 2 Score 4 6 4 4 2 4  0  Altered sleeping - 3 - - - - -  Tired, decreased energy - 3 - - - - -  Change in appetite - 3 - - - - -  Feeling bad or failure about yourself  - 3 - - - - -  Trouble concentrating - 2 - - - - -  Moving slowly or fidgety/restless - 1 - - - - -  Suicidal thoughts - 0 - - - - -  PHQ-9 Score - 21 - - - - -  Difficult doing work/chores - Extremely dIfficult - - - - -  Some encounter information is confidential and restricted. Go to Review Flowsheets activity to see all data.  Some recent data might be hidden    Review of Systems  Constitutional: Negative.   HENT: Negative.   Eyes: Negative.   Respiratory: Negative.   Cardiovascular: Negative.   Gastrointestinal: Negative.   Endocrine: Negative.   Genitourinary: Negative.   Musculoskeletal: Positive for arthralgias, back pain, gait problem, joint swelling, myalgias and neck pain.  Skin: Negative.   Allergic/Immunologic: Negative.   Neurological: Positive for weakness.  Hematological: Negative.   Psychiatric/Behavioral: Positive for dysphoric mood.  All other systems reviewed and are negative.      Objective:   Physical Exam Vitals and nursing note reviewed.  Constitutional:      Appearance: Normal appearance.  Cardiovascular:     Rate and Rhythm: Normal rate and regular rhythm.     Pulses: Normal pulses.     Heart sounds: Normal heart sounds.  Pulmonary:     Effort: Pulmonary effort is normal.     Breath sounds: Normal breath sounds.  Musculoskeletal:     Cervical back: Normal range of motion and neck supple.     Comments: Normal Muscle Bulk and Muscle Testing Reveals:  Upper Extremities: Full ROM and Muscle Strength 5/5 Thoracic Paraspinal Tenderness: T-7-T-9 Mainly Left Side Lumbar Paraspinal Tenderness: L-4-L-5 Lower Extremities: Decreased ROM and Muscle Strength 5/5 Left Lower Extremity Flexion Produces pain into her bilateral lower extremities and bilateral knees.  She  arises from Table slowly using cane for support Antalgic  Gait   Skin:    General: Skin is warm and dry.  Neurological:     Mental Status: She is alert and oriented to person, place, and time.  Psychiatric:        Mood and Affect: Mood normal.        Behavior: Behavior normal.           Assessment & Plan:  1.Chronic Bilateral Thoracic Back Pain/ Low Back Pain/ Lumbar Facet Arthropathy:RefilledMSIR15mg  one tablet every 6 hoursas needed #105andContinue withSlow weaning ofMorphine. Refilled:MS Contin 60 mg and 30 mg Q 12 hours to equal 90 mg #60.Continue Gabapentin and Pamelor.07/07/2020 We will continue the opioid monitoring program, this consists of regular clinic visits, examinations, urine drug screen, pill counts as well as use of [place] Controlled Substance Reporting system. A 12 month History has been reviewed on the [place] Controlled Substance Reporting Systemon06/02/2020. 2. Degenerative Disk Disease:Encouraged Continue HEP as Tolertatedand heat therapy. Continue Current Medication Regime.07/07/2020 3.Bilateral Greater Trochanteric Bursitis:Continuecurrent treatment regimenwith Heat and Ice Therapy.07/07/2020 4. Osteoarthritis of Bilateral Knees:R>LContinue current treatment modality with homeexerciseprogramand heat therapy.07/07/2020 5. Migraine Headaches:Continue withMigraine Journal. Aimovigdiscontinued due to Hives. ContinueLamictalandMaxalt. Continue to Monitor.07/07/2020 6. Smoking Cessation: She has quit smokingsince 08/25/2016. Continue to Monitor.07/07/2020 7.Constipation: ContinueCurrent medication regime withLinzess.07/07/2020 8. Muscle Spasm: Continuecurrent medication regime withTizanidine.07/07/2020 9. DepressionAnxiety/ Panic Attacks: Continuecurrent medication regimen and treatment modality.Celexa.Lamictaland Counseling with [name] andDr.Akhtar Psychiatrist.07/07/2020 10. RSD: Continue  with current medication Regimewith Gabapentin.07/07/2020  F/U in 1 month

## 2020-07-09 ENCOUNTER — Ambulatory Visit (INDEPENDENT_AMBULATORY_CARE_PROVIDER_SITE_OTHER): Payer: Medicare Other | Admitting: Licensed Clinical Social Worker

## 2020-07-09 DIAGNOSIS — F331 Major depressive disorder, recurrent, moderate: Secondary | ICD-10-CM

## 2020-07-09 DIAGNOSIS — G894 Chronic pain syndrome: Secondary | ICD-10-CM

## 2020-07-09 DIAGNOSIS — F411 Generalized anxiety disorder: Secondary | ICD-10-CM | POA: Diagnosis not present

## 2020-07-09 DIAGNOSIS — M5136 Other intervertebral disc degeneration, lumbar region: Secondary | ICD-10-CM | POA: Diagnosis not present
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
## 2020-07-14 ENCOUNTER — Telehealth: Payer: Self-pay | Admitting: Family Medicine

## 2020-07-14 NOTE — Progress Notes (Signed)
  Chronic Care Management   Outreach Note  07/14/2020 Name: [name] MRN: [id number] DOB: 1960/04/12  Referred by: Hali Marry, MD Reason for referral : No chief complaint on file.   A second unsuccessful telephone outreach was attempted today. The patient was referred to pharmacist for assistance with care management and care coordination.  Follow Up Plan:   Lauretta Grill Upstream Scheduler

## 2020-07-19 ENCOUNTER — Ambulatory Visit (INDEPENDENT_AMBULATORY_CARE_PROVIDER_SITE_OTHER): Payer: Medicare Other | Admitting: Licensed Clinical Social Worker

## 2020-07-19 DIAGNOSIS — M5136 Other intervertebral disc degeneration, lumbar region: Secondary | ICD-10-CM

## 2020-07-19 DIAGNOSIS — F331 Major depressive disorder, recurrent, moderate: Secondary | ICD-10-CM

## 2020-07-19 DIAGNOSIS — G894 Chronic pain syndrome: Secondary | ICD-10-CM | POA: Diagnosis not present

## 2020-07-19 DIAGNOSIS — F411 Generalized anxiety disorder: Secondary | ICD-10-CM | POA: Diagnosis not present

## 2020-07-19 NOTE — Progress Notes (Signed)
Virtual Visit via Video Note  I connected with [name] on 07/19/20 at  8:00 AM EDT by a video enabled telemedicine application and verified that I am speaking with the correct person using two identifiers.  Location: Patient: home Provider: home office   I discussed the limitations of evaluation and management by telemedicine and the availability of in person appointments. The patient expressed understanding and agreed to proceed.   I discussed the assessment and treatment plan with the patient. The patient was provided an opportunity to ask questions and all were answered. The patient agreed with the plan and demonstrated an understanding of the instructions.   The patient was advised to call back or seek an in-person evaluation if the symptoms worsen or if the condition fails to improve as anticipated.  I provided 53 minutes of non-face-to-face time during this encounter.  THERAPIST PROGRESS NOTE  Session Time: 9:00 AM to 9:53 AM  Participation Level: Active  Behavioral Response: CasualAlertDysphoricanxious  Type of Therapy: Individual Therapy  Treatment Goals addressed:  Decrease in depression, anxiety, stress management, coping Interventions: Solution Focused, Strength-based, Supportive, and Other: coping  Summary: [name] is a 60 y.o. female who presents with not great but not to bad getting a headache. Her hips are screaming at her and always pain with her knees. Usually don't bother her at the same time also shared she is getting bursitis. Dr. Naaman Plummer her pain doctor and Zella Ball the PA who she really likes very nice and takes care of her most of the time. Reducing pain being kind and doing slow. Have to be OK they are going to do not matter what has to accept but hard knows what it is going to lead to. Today not feeling well medicine not as effective not fun to live with ice packs and heat at the same time. Ice packs numb knees but can't sit like that ll  the time. With warmth the pain comes back. It is going to to be bad knows that. They said the body will remember the components of the pain management in your body they will remember it and will give some of that back. The people who feel great getting off medications don't make her feel better. Patient is allergic to all these other things that they can give her from pain. What about the people who can't take other things besides opiates. Some medications that aren't opiates insurance won't pay for it. If this doesn't work then what happens. It is already a year since they started. It is coming up time to reduce, reduce 90 mg to 75. That is a significant amount. Have enough anxiety without that. They know she has anxiety and panic attacks and they are like sorry they can't do anything about that wish they be more sympathetic. Hurting "like hell" with this medicine. Take medicine at 6 AM hasn't helped so far takes awhile for it to kick if it is going to. Thinks it has kicked so thinks will hurt for next 9 hours 45 minutes. After hang up will get and some ice packets for knees. Even when helps expectation know the pain is going to come back. Another thing to be anxious about it. Hard to distract from it.  Used distraction to focus on something positive in session. [name] had a birthday she turned [age] [birth year]th of [birth year]. They skype her on birthday and watched her open presents. They only ones who do that of grandparents. Talked about nephew who  is thoughtful and asked about everyone. Close with [name], her sister, not as close with [name] other sister.(Older sister) Mom had four kids in five years. Patient was not planned. Mom wanted her to be a boy. [name] was another one that was a surprise. Was supposed to be "[name]" didn't give her such as hard time as patient. Even as an adult didn't let up. Patient was close with dad and may have been jealous of that. As a little kid ask yourself why only to me and not everyone  else. [name] oldest she was special, [name] only boy special and [name] baby special and patient ask why wasn't she special always felt like. She was hard on [name] not as much as patient. Therapist explored how it affects her now. She is gone and hope that they are around [place]. If she chose to be around whether she wants to be. Talk to her once awhile and daddy all the time. Try to not hold grudges talking like this feels like talking bad feels guilty. If she hears her she might be angry at her. Get anxiety dumb things and can't help it. Feel easier if ok and not feel bad about Mom so tries not to hold grudges. Their birthdays were in early July celebrate when 4th of July. Join in with the community. Mom would resent it that they didn't do it for her first (birthday on July 1). To her it seemed like all for him. Explored whether insecurities of mom and patient said entitlement wanted it to be for her. Always going to be the best for her. Supposed to be for both but 4th was day that they had off. Patient things mom like being hateful and resentful to people. [name] gave her anything she wanted.  Patient said that the distraction helps during sessions also talk about things would normally make her feel better and feels better after talking. .  Reports reviewed symptoms, facilitated expression of thoughts and feelings important intervention as patient is processing feelings around interventions for pain, therapist validating her on how she feels hopefully treatment team will have options for patient as she decreases that we will help manage pain appropriately.  Also noted getting opiates out of her system if possible would be a positive for her physical state but at the same time being able to manage pain appropriately.  Utilize distraction to help patient deal with having a difficult morning, for asking what positive to focus on noticed talked about granddaughter celebrating her birthday as well as [name] her nephew who is  very thoughtful.  Talked about past and processed feelings related to past noted helpful ways patient is coping by trying to let go of negative emotions related to her mom's relationship helpful for coping.  Therapist provided active listening, open questions, supportive interventions    Suicidal/Homicidal: No  Plan: Return again in 2 weeks.2.  Therapist utilized strength based and supportive interventions, processing feelings and reframing to help patient with symptoms  Diagnosis: Axis I: major depressive disorder, recurrent, moderate, degenerative disc disease, chronic pain syndrome, generalized anxiety disorder    Axis II: No diagnosis    [name], LCSW 07/19/2020

## 2020-07-22 IMAGING — MG DIGITAL SCREENING BILATERAL MAMMOGRAM WITH TOMO AND CAD
6 of 12 series · 6 of 36 positions shown · non-contrast
Comparison: Previous exam(s).

CLINICAL DATA: Screening.

EXAM:
DIGITAL SCREENING BILATERAL MAMMOGRAM WITH TOMO AND CAD

[L XCCL synth-2D]
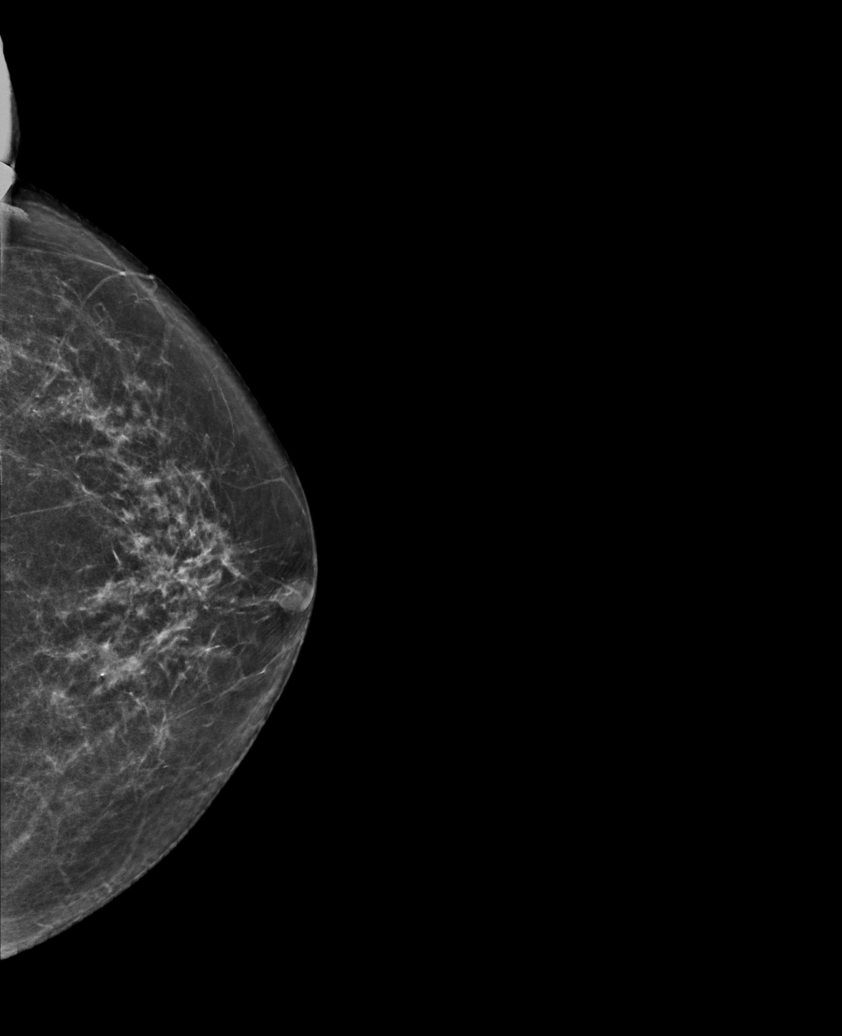

[L CC synth-2D]
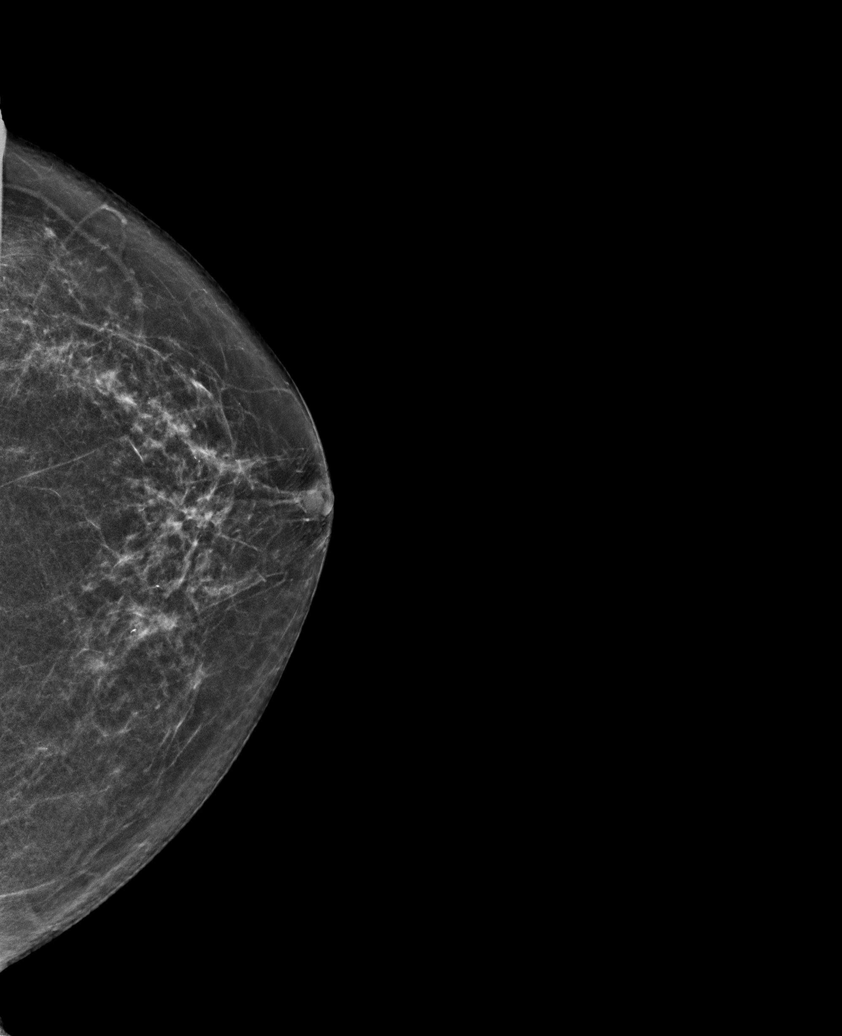

[L MLO synth-2D]
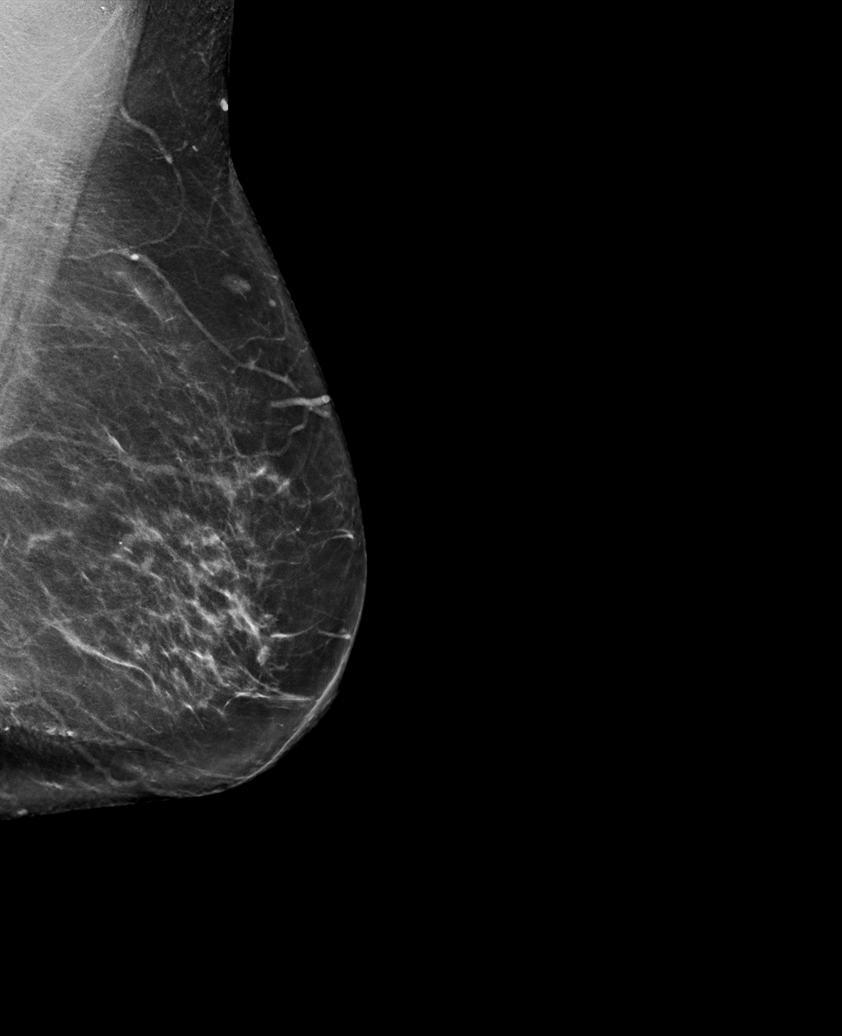

[R CC synth-2D]
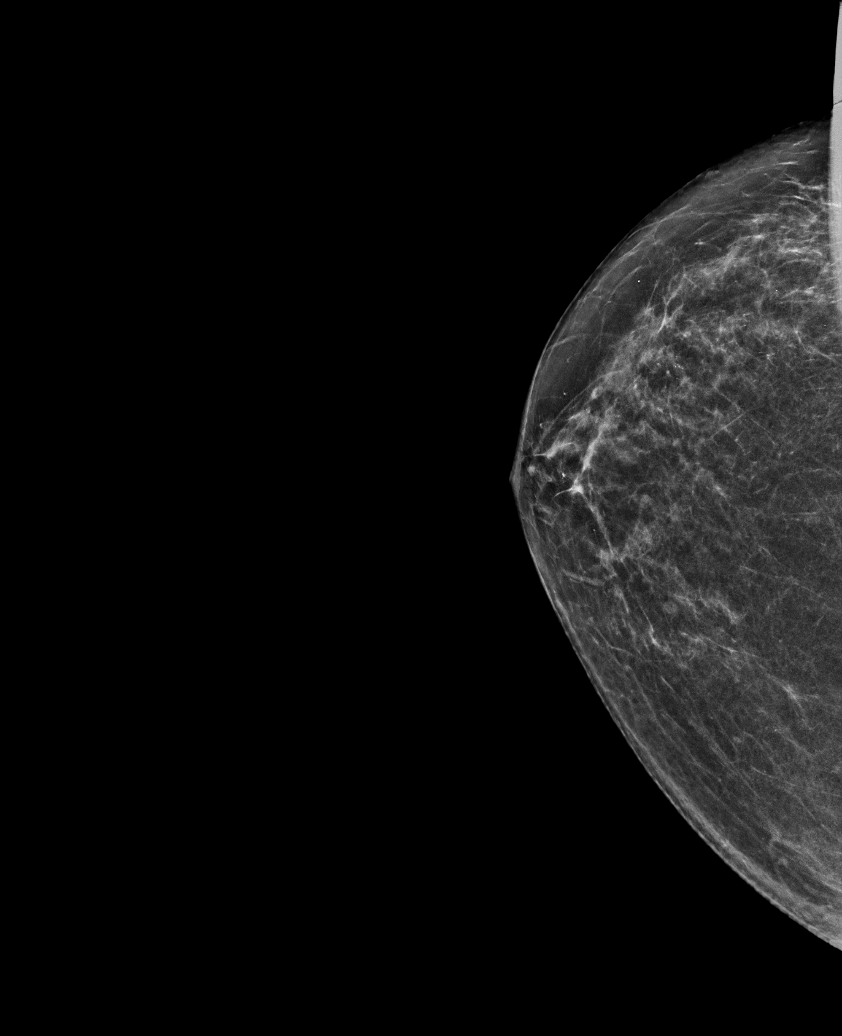

[R MLO synth-2D]
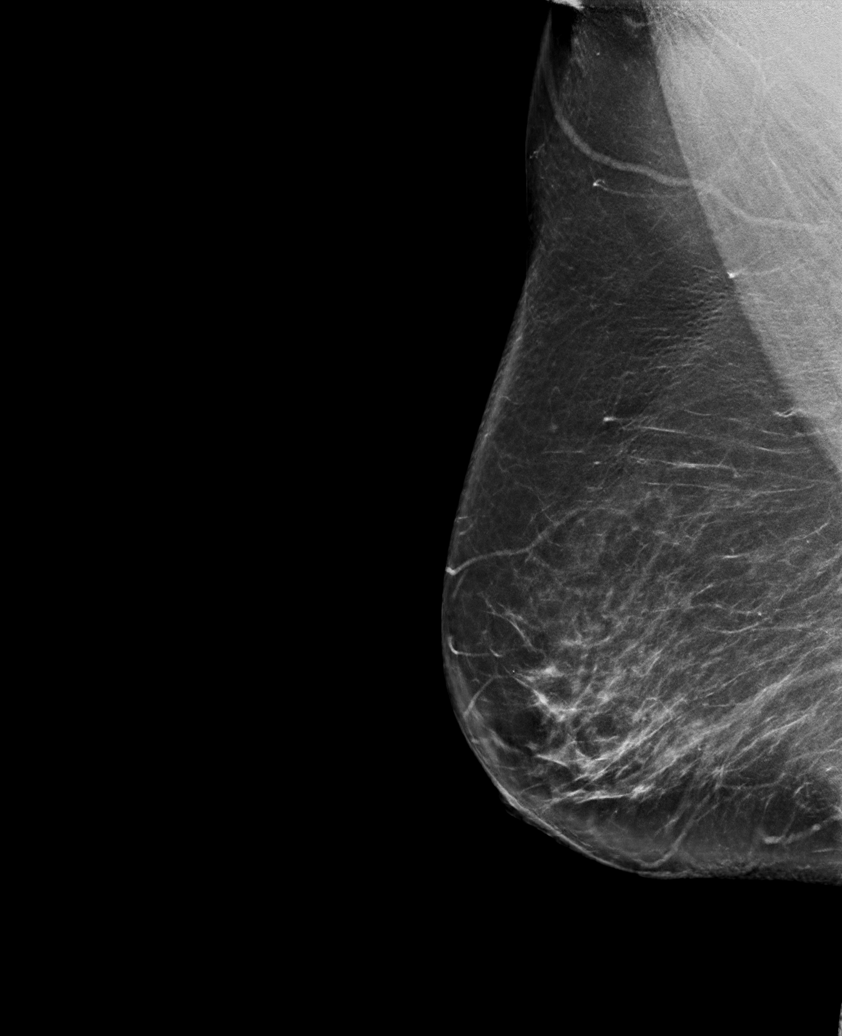

[R XCCL synth-2D]
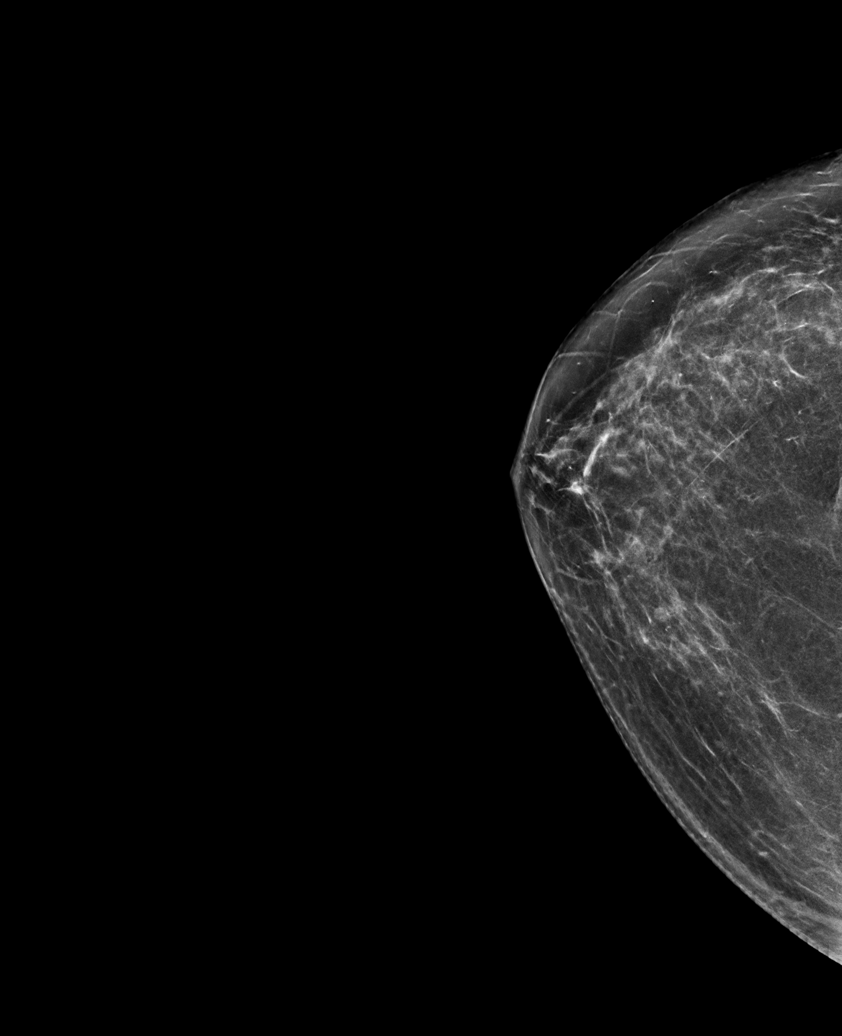

[6 of 36 positions shown; findings below may reference images not displayed]

ACR Breast Density Category b: There are scattered areas of
fibroglandular density.
FINDINGS: There are no findings suspicious for malignancy. Images were
processed with CAD.
IMPRESSION: No mammographic evidence of malignancy. A result letter of this
screening mammogram will be mailed directly to the patient.

RECOMMENDATION:
Screening mammogram in one year. (Code:[id number])

BI-RADS CATEGORY  1: Negative.

## 2020-07-23 IMAGING — US US ABDOMEN COMPLETE W/ ELASTOGRAPHY
2 series · 12 of 25 positions shown · non-contrast
Comparison: None

CLINICAL DATA: Elevated LFTs; history COPD, former smoker, GERD



[Series 1: us abdomen complete w/ elastography · 10 of 109 slices shown (1 of 2)]
[im 6/109]
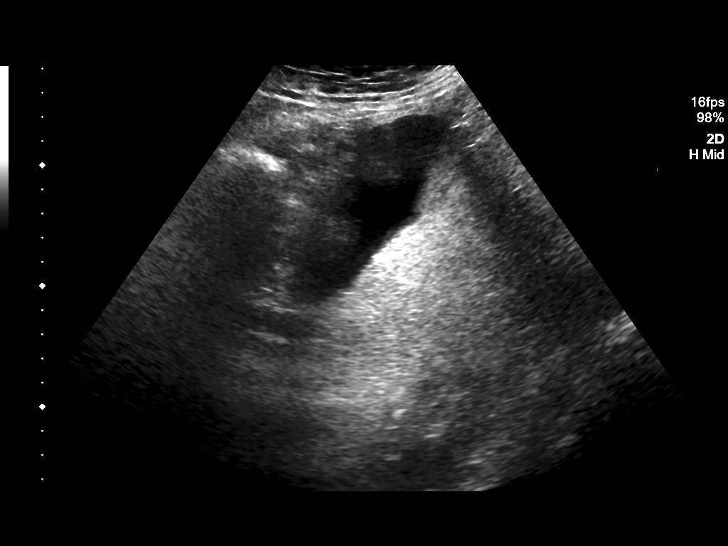
[im 18/109]
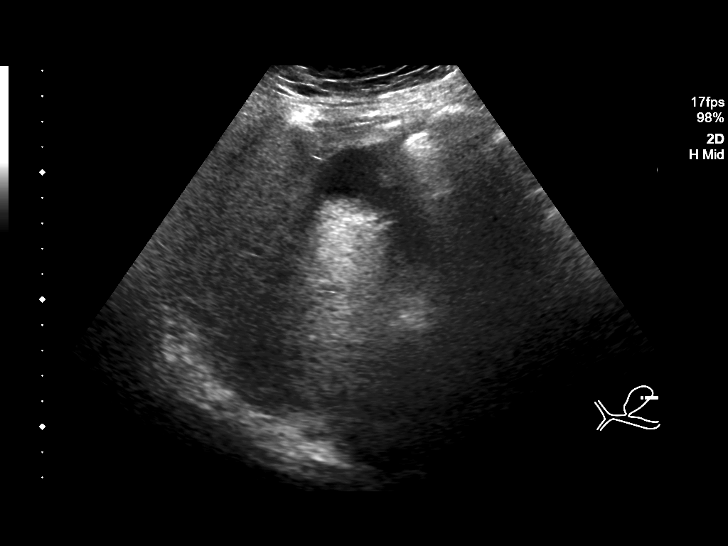
[im 29/109]
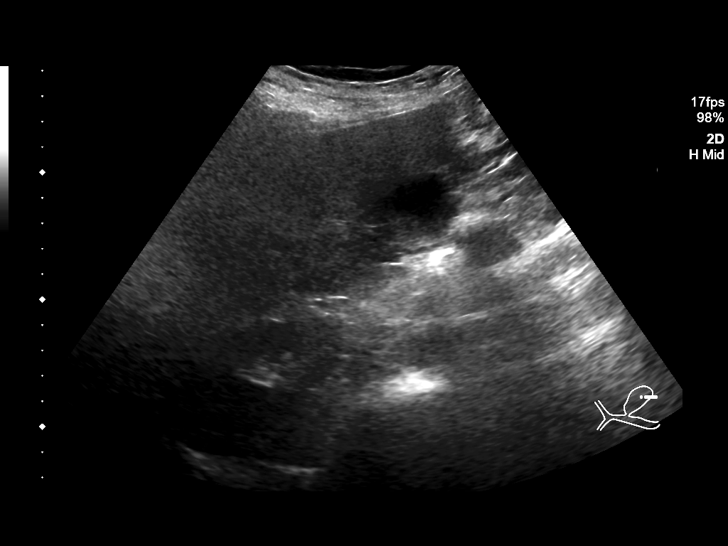
[im 40/109]
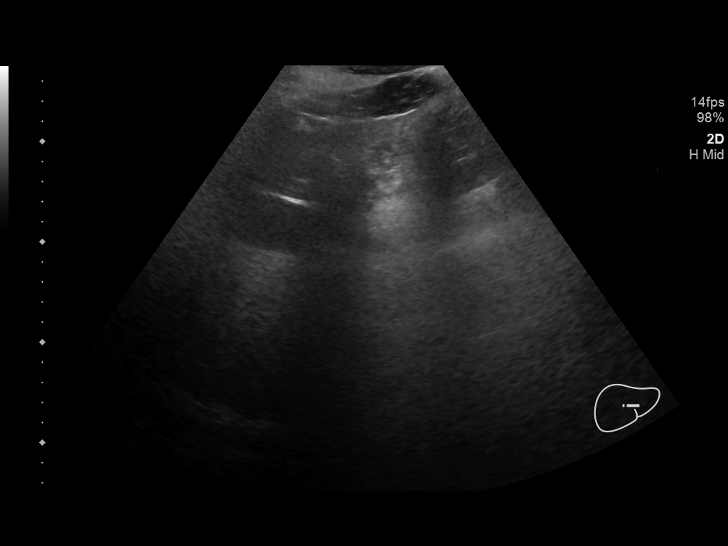
[im 52/109]
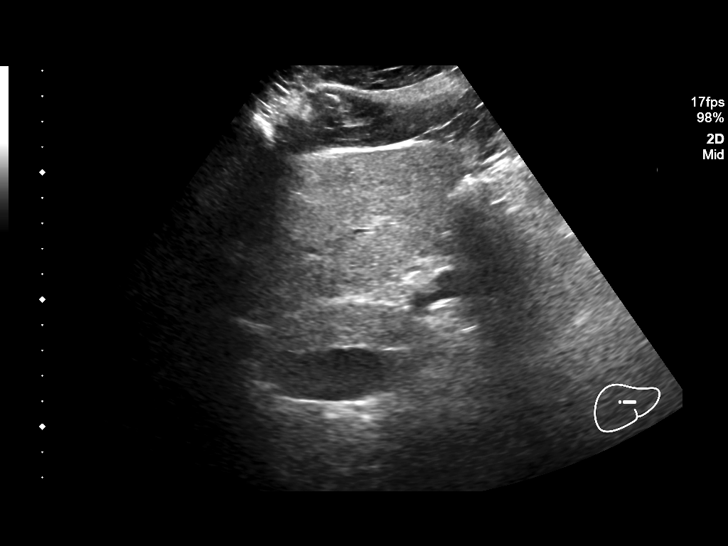
[im 63/109]
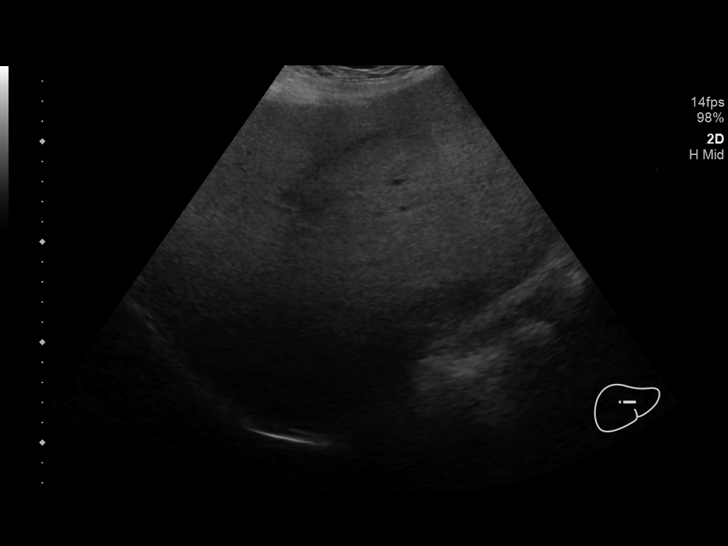
[im 74/109]
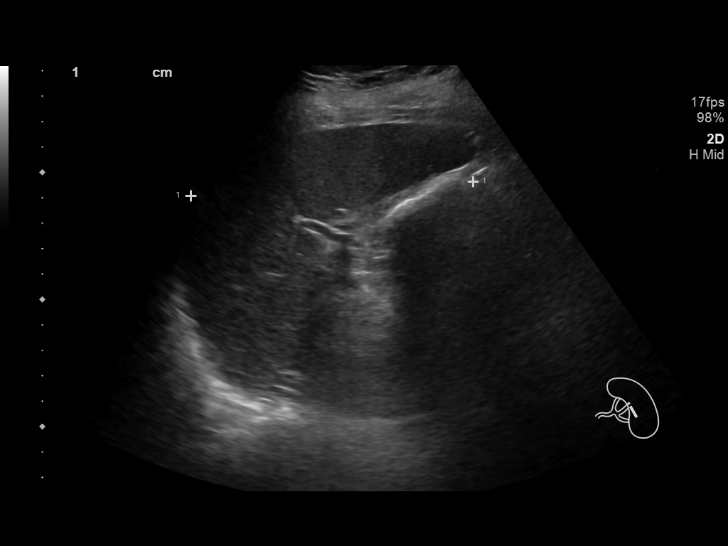
[im 86/109]
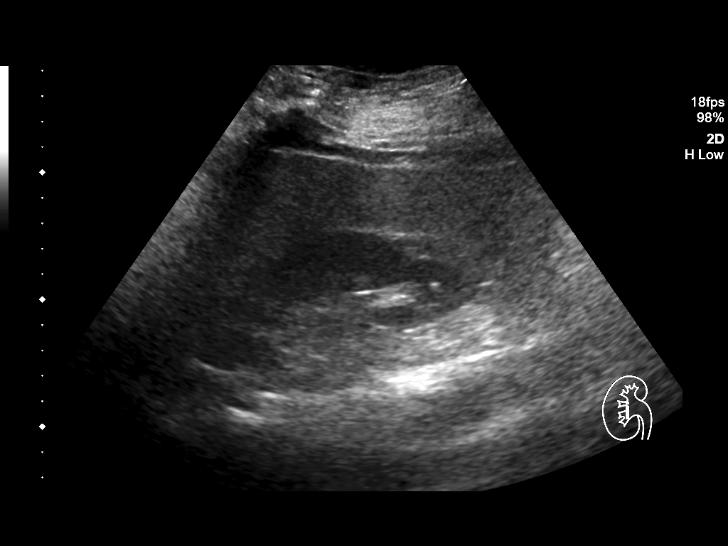
[im 97/109]
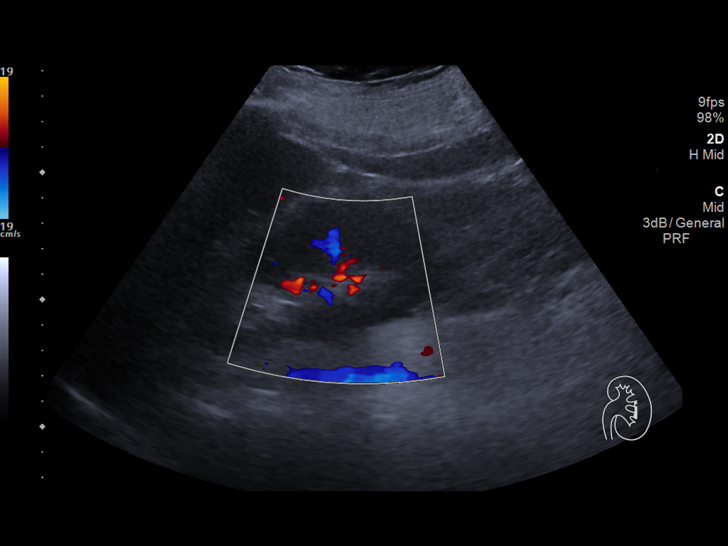
[im 109/109]
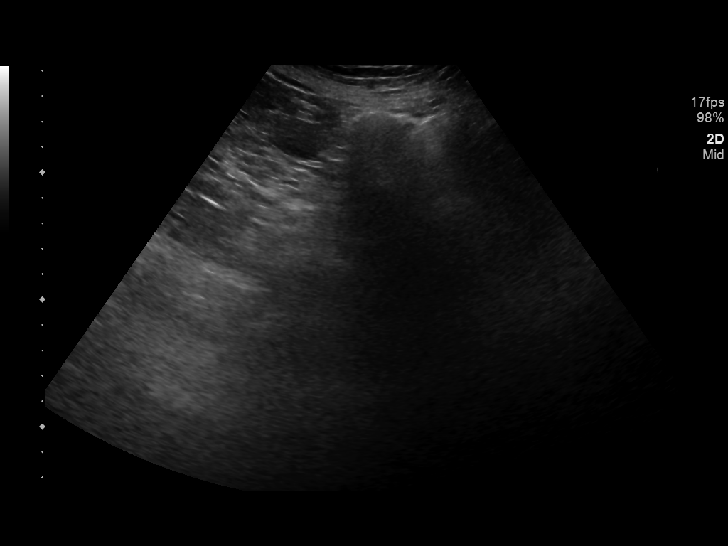

[Series 2: us abdomen complete w/ elastography · 2 of 27 slices shown (2 of 2)]
[im 7/27]
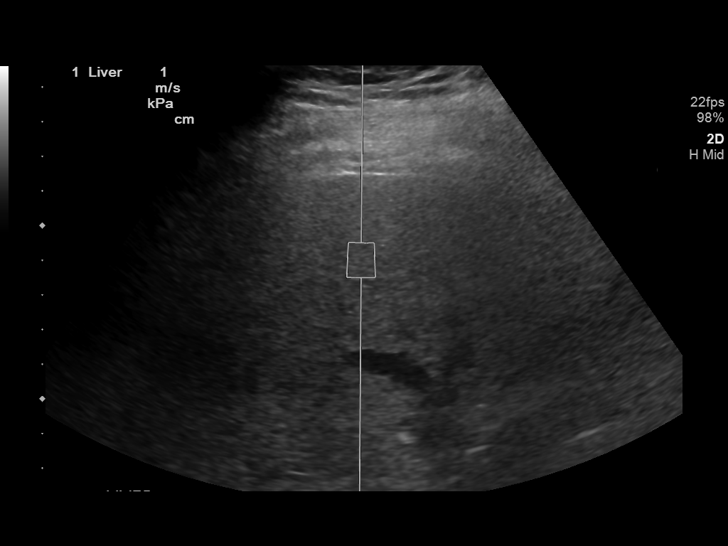
[im 20/27]
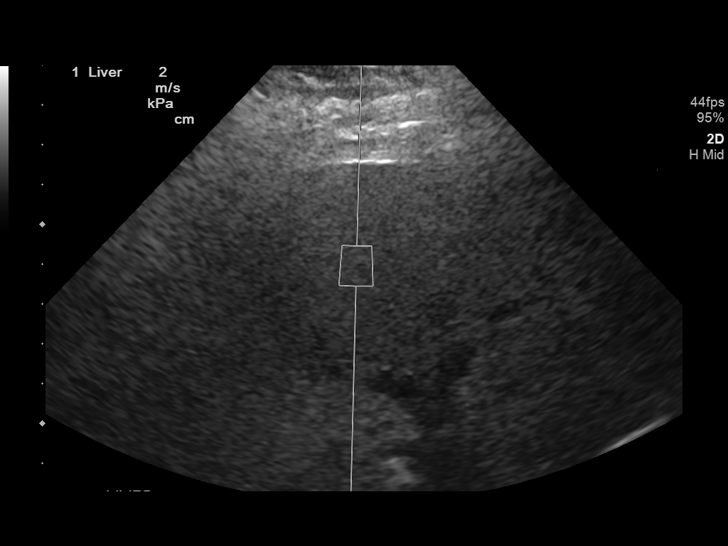

[12 of 25 positions shown; findings below may reference images not displayed]

FINDINGS: ULTRASOUND ABDOMEN

Gallbladder: Normally distended without stones or wall thickening.
No pericholecystic fluid or sonographic Murphy sign.

Common bile duct: Diameter: Dilated 10 mm diameter

Liver: Increased hepatic echogenicity, can be seen with fatty
infiltration, cirrhosis and certain infiltrative disorders. Very
poor sound transmission through liver due to sound attenuation on
the basis of body habitus and increased hepatic echogenicity. No
gross evidence of a patent mass or nodularity visualized on limited
assessment. Portal vein is patent on color Doppler imaging with
normal direction of blood flow towards the liver.

IVC: Normal appearance

Pancreas: Pancreatic body and proximal tail normal appearance,
remainder obscured by bowel gas

Spleen: Normal appearance, 11.1 cm length

Right Kidney: Length: 11.3 cm. Normal morphology without mass or
hydronephrosis.

Left Kidney: Length: 11.3 cm. Normal morphology without mass or
hydronephrosis.

Abdominal aorta: Normal caliber proximally, obscured at mid and
distal portions

Other findings: No free fluid

ULTRASOUND HEPATIC ELASTOGRAPHY

Device: Siemens Helix VTQ

Patient position: LEFT lateral decubitus

Transducer 4V1, 5C1

Number of measurements: Multiple, at 2 sites

Hepatic segment:  8

Elastography nondiagnostic, unable to obtain adequate shear wave
velocities at 2 sites in the liver

Median velocity:    m/sec

IQR:

IQR/Median velocity ratio:

Corresponding Metavir fibrosis score:

Risk of fibrosis:

Limitations of exam: Body habitus and increased hepatic echogenicity

Please note that abnormal shear wave velocities may also be
identified in clinical settings other than with hepatic fibrosis,
such as: acute hepatitis, elevated right heart and central venous
pressures including use of beta blockers, Kaheng disease
(Rudi), infiltrative processes such as
mastocytosis/amyloidosis/infiltrative tumor, extrahepatic
cholestasis, in the post-prandial state, and liver transplantation.
Correlation with patient history, laboratory data, and clinical
condition recommended.
IMPRESSION: ULTRASOUND ABDOMEN:

Increased hepatic echogenicity, question fatty infiltration though
this can be seen with cirrhosis and certain infiltrative disorders.

Extrahepatic biliary dilatation without obvious intrahepatic
dilatation, recommend correlation with LFTs.

ULTRASOUND HEPATIC ELASTOGRAPHY:

Nondiagnostic hepatic elastography due to inability to obtain
diagnostic shear wave velocities within liver parenchyma, likely due
to a combination of body habitus and increased hepatic echogenicity
limiting sound transmission.

Consider rescheduling this patient at no additional charge at [facility] (phone number [phone]) for
repeat attempt at hepatic elastography.

## 2020-07-27 ENCOUNTER — Other Ambulatory Visit: Payer: Self-pay | Admitting: Registered Nurse

## 2020-08-02 ENCOUNTER — Other Ambulatory Visit: Payer: Self-pay | Admitting: Family Medicine

## 2020-08-02 ENCOUNTER — Ambulatory Visit (INDEPENDENT_AMBULATORY_CARE_PROVIDER_SITE_OTHER): Payer: Medicare Other | Admitting: Licensed Clinical Social Worker

## 2020-08-02 DIAGNOSIS — F411 Generalized anxiety disorder: Secondary | ICD-10-CM

## 2020-08-02 DIAGNOSIS — F331 Major depressive disorder, recurrent, moderate: Secondary | ICD-10-CM | POA: Diagnosis not present

## 2020-08-02 DIAGNOSIS — M5136 Other intervertebral disc degeneration, lumbar region: Secondary | ICD-10-CM

## 2020-08-02 DIAGNOSIS — G894 Chronic pain syndrome: Secondary | ICD-10-CM

## 2020-08-02 NOTE — Progress Notes (Signed)
Virtual Visit via Telephone Note  I connected with [name] on 08/02/20 at  8:00 AM EDT by telephone and verified that I am speaking with the correct person using two identifiers.  Location: Patient: home Provider: home office   I discussed the limitations, risks, security and privacy concerns of performing an evaluation and management service by telephone and the availability of in person appointments. I also discussed with the patient that there may be a patient responsible charge related to this service. The patient expressed understanding and agreed to proceed.   I discussed the assessment and treatment plan with the patient. The patient was provided an opportunity to ask questions and all were answered. The patient agreed with the plan and demonstrated an understanding of the instructions.   The patient was advised to call back or seek an in-person evaluation if the symptoms worsen or if the condition fails to improve as anticipated.  I provided 46 minutes of non-face-to-face time during this encounter.  THERAPIST PROGRESS NOTE  Session Time: 8:00 AM to 8:46 AM  Participation Level: Active  Behavioral Response: CasualAlertDysphoric  Type of Therapy: Individual Therapy  Treatment Goals addressed:  Decrease in depression, anxiety, stress management, coping Interventions: Solution Focused, Strength-based, Supportive, and Reframing  Summary: CUCA BENASSI is a 60 y.o. female who presents with parents birthdays in July and thinks about them all the time. Dad gone for 10 years and Mom gone 41/2 and can believe it. Miss them even Mom. Counting the days until the kids come home end of July. They are going to buy a house glad because they thought they would move to [place] when he retired and not going to do that now. [name] was traumatized growing up, this is her daughter adopted. Her Mom killed herself using her medicine, Albuterol, had a heart attack this happened  within a day. She was messed up got into drugs having sex with "anyone who came by". She was a mess when they brought her back after graduation. Before she came wrote back and forth all the time they got close. She worked at [employer] until entered the [employer]. 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 Abe People is a Loss adjuster, chartered. Talked about memory of seeing dad very upsetting got sick seeing him in viewing in a cardboard box nude. He was cremated. Spread parents ashes on their property. Didn't get to see dad when in rehab for 3 months feels a lot of guilt one thing [name] she thinks doesn't forgive her for. He had an heart attack, at that point he was better coming home. Gave background history. Christmas brother very insulting saying the house smelled and and threw [place], her cat at her told him to get out and hasn't really been back since. Mom blamed patient for treating brother that way. Patient shares why do you say that after what he said, Mom told her why don't you commit suicide. [name] said enough and they got up and left. Sisters left. Didn't speak to Mom for over a year. Only because Dad had that attack. [name] never said anything to patient except through an email. Both very close to patient's father. [name] wasn't talking to her then. Knows partly jealous of [name] and she thinks blamed patient for her daddy's death. Last words "were leave Marchia Bond alone" patient contacting him to fine out what was going with [name] because was not hearing from her. Before adopted talked about it for months and  was ok with adopting [name] okay.  Therapist pointed out daughter did not give her a chance for closure and to talk things through making that difficult patient not being able to blame herself for things when her daughter does not even give her a chance to work through them.  Phone disconnected unable to reach patient for end of session.  Therapist reviewed symptoms, facilitated expression of  thoughts and feelings and work with patient on processing feelings from the past, guilt feelings helping her to verbalize them to help her work through them working to have patient feel less responsible things outside her control, things not to be held responsible for, recognizing herself is human, significant hurtful words her mom said to her would have a very hurtful impact on her.  Assess helpful for session to be talking about the significant past events is actively working on goals and treatment.  Therapist provided active listening, open questions, supportive interventions Suicidal/Homicidal: No  Plan: Return again in 3 weeks.2.  Continue to work with patient in processing feelings from the past, coping with current stressors  Diagnosis: Axis I: major depressive disorder, recurrent, moderate, degenerative disc disease, chronic pain syndrome, generalized anxiety disorder    Axis II: No diagnosis    [name], LCSW 08/02/2020

## 2020-08-03 ENCOUNTER — Telehealth: Payer: Self-pay | Admitting: Family Medicine

## 2020-08-03 NOTE — Progress Notes (Signed)
  Chronic Care Management   Outreach Note  08/03/2020 Name: [name] MRN: [id number] DOB: January 19, 1961  Referred by: Hali Marry, MD Reason for referral : No chief complaint on file.   Third unsuccessful telephone outreach was attempted today. The patient was referred to the pharmacist for assistance with care management and care coordination.   Follow Up Plan:   Lauretta Grill Upstream Scheduler

## 2020-08-06 ENCOUNTER — Other Ambulatory Visit: Payer: Self-pay

## 2020-08-06 ENCOUNTER — Encounter: Payer: Medicare Other | Attending: Physical Medicine and Rehabilitation | Admitting: Registered Nurse

## 2020-08-06 ENCOUNTER — Encounter: Payer: Self-pay | Admitting: Registered Nurse

## 2020-08-06 VITALS — BP 119/76 | HR 93 | Temp 98.4°F | Ht 68.0 in | Wt 233.0 lb

## 2020-08-06 DIAGNOSIS — F329 Major depressive disorder, single episode, unspecified: Secondary | ICD-10-CM | POA: Diagnosis present

## 2020-08-06 DIAGNOSIS — M7062 Trochanteric bursitis, left hip: Secondary | ICD-10-CM | POA: Insufficient documentation

## 2020-08-06 DIAGNOSIS — G9050 Complex regional pain syndrome I, unspecified: Secondary | ICD-10-CM | POA: Diagnosis not present

## 2020-08-06 DIAGNOSIS — G43119 Migraine with aura, intractable, without status migrainosus: Secondary | ICD-10-CM | POA: Diagnosis not present

## 2020-08-06 DIAGNOSIS — Z79899 Other long term (current) drug therapy: Secondary | ICD-10-CM | POA: Insufficient documentation

## 2020-08-06 DIAGNOSIS — M47816 Spondylosis without myelopathy or radiculopathy, lumbar region: Secondary | ICD-10-CM | POA: Insufficient documentation

## 2020-08-06 DIAGNOSIS — M5416 Radiculopathy, lumbar region: Secondary | ICD-10-CM | POA: Insufficient documentation

## 2020-08-06 DIAGNOSIS — G894 Chronic pain syndrome: Secondary | ICD-10-CM | POA: Diagnosis not present

## 2020-08-06 DIAGNOSIS — M7061 Trochanteric bursitis, right hip: Secondary | ICD-10-CM | POA: Diagnosis not present

## 2020-08-06 DIAGNOSIS — M546 Pain in thoracic spine: Secondary | ICD-10-CM | POA: Insufficient documentation

## 2020-08-06 DIAGNOSIS — Z79891 Long term (current) use of opiate analgesic: Secondary | ICD-10-CM | POA: Insufficient documentation

## 2020-08-06 DIAGNOSIS — G8929 Other chronic pain: Secondary | ICD-10-CM | POA: Diagnosis not present

## 2020-08-06 DIAGNOSIS — M1711 Unilateral primary osteoarthritis, right knee: Secondary | ICD-10-CM | POA: Diagnosis not present

## 2020-08-06 DIAGNOSIS — M1712 Unilateral primary osteoarthritis, left knee: Secondary | ICD-10-CM | POA: Insufficient documentation

## 2020-08-06 DIAGNOSIS — Z5181 Encounter for therapeutic drug level monitoring: Secondary | ICD-10-CM | POA: Diagnosis not present

## 2020-08-06 MED ORDER — MORPHINE SULFATE ER 60 MG PO TBCR: 60.0000 mg | EXTENDED_RELEASE_TABLET | Freq: Two times a day (BID) | ORAL | 0 refills | Status: DC

## 2020-08-06 MED ORDER — MORPHINE SULFATE ER 30 MG PO TBCR: 30.0000 mg | EXTENDED_RELEASE_TABLET | Freq: Two times a day (BID) | ORAL | 0 refills | Status: DC

## 2020-08-06 MED ORDER — MORPHINE SULFATE 15 MG PO TABS: 15.0000 mg | ORAL_TABLET | Freq: Four times a day (QID) | ORAL | 0 refills | Status: DC | PRN

## 2020-08-06 NOTE — Progress Notes (Signed)
Subjective:    Patient ID: [name], female    DOB: December 09, 1960, 60 y.o.   MRN: [id number]  HPI: [name] is a 60 y.o. female who returns for follow up appointment for chronic pain and medication refill. She states her pain is located in  her mid- lower back, bilateral hips and bilateral knees R>L. She. rates her pain 8. Her current exercise regime is walking and performing chair exercises.  Ms. Banka Morphine equivalent is 180.00  MME.   UDS ordered today.   Pain Inventory Average Pain 8 Pain Right Now 8 My pain is constant, sharp, burning, dull, stabbing, tingling, and aching  In the last 24 hours, has pain interfered with the following? General activity 10 Relation with others 10 Enjoyment of life 10 What TIME of day is your pain at its worst? varies Sleep (in general) Poor  Pain is worse with: walking, bending, standing, and some activites Pain improves with: rest, heat/ice, therapy/exercise, medication, TENS, and injections Relief from Meds: 6  Family History  Problem Relation Age of Onset   Depression Mother    Hypertension Mother    Hypertension Father    Heart failure Father    Diabetes Father    Heart attack Father    Colon cancer Neg Hx    Esophageal cancer Neg Hx    Stomach cancer Neg Hx    Rectal cancer Neg Hx    Social History   Socioeconomic History   Marital status: Married    Spouse name: [name]   Number of children: 1   Years of education: some college   Highest education level: 12th grade  Occupational History   Occupation: On disability    Employer: UNEMPLOYED  Tobacco Use   Smoking status: Former    Packs/day: 0.50    Pack years: 0.00    Types: Cigarettes    Quit date: 02/06/2016    Years since quitting: 4.5   Smokeless tobacco: Never  Vaping Use   Vaping Use: Not on file  Substance and Sexual Activity   Alcohol use: No    Alcohol/week: 0.0 standard drinks   Drug use: No   Sexual activity: Not Currently     Partners: Male  Other Topics Concern   Not on file  Social History Narrative   On disability. Lives with her husband. She has a adopted daughter. She likes to read in her free time. She does chair exercises for 30-45 mins everyday.   Social Determinants of Health   Financial Resource Strain: Low Risk    Difficulty of Paying Living Expenses: Not hard at all  Food Insecurity: No Food Insecurity   Worried About Charity fundraiser in the Last Year: Never true   [place] in the Last Year: Never true  Transportation Needs: No Transportation Needs   Lack of Transportation (Medical): No   Lack of Transportation (Non-Medical): No  Physical Activity: Inactive   Days of Exercise per Week: 0 days   Minutes of Exercise per Session: 0 min  Stress: No Stress Concern Present   Feeling of Stress : Not at all  Social Connections: Socially Isolated   Frequency of Communication with Friends and Family: Never   Frequency of Social Gatherings with Friends and Family: Once a week   Attends Religious Services: Never   Marine scientist or Organizations: No   Attends Archivist Meetings: Never   Marital Status: Married   Past Surgical History:  Procedure Laterality Date   ABDOMINAL HYSTERECTOMY  May 2004   [place] Cell Carcinoma  06/2005, 07/2005   nasal tip and reconstruction   BREAST CYST ASPIRATION     CARPAL TUNNEL RELEASE  07/1993   both hands   COLONOSCOPY     HEMORROIDECTOMY  July 2003   KNEE ARTHROPLASTY  09/2001, 05/2003   left knee, chondromalasia   KNEE ARTHROSCOPY  jan 2005   Right knee, tisse release, chrondromalacia   REPLACEMENT TOTAL KNEE  Nov 2005   Left    THYROID LOBECTOMY  01/2005   malignant area removed from right lobe   THYROID SURGERY     thyroid lobe removal   TONSILLECTOMY  09/1972   TUBAL LIGATION  Sept 1999   Ulnar nerve entrapment  Feb 1995   left elbow   Past Surgical History:  Procedure Laterality Date   ABDOMINAL HYSTERECTOMY  May 2004    [place] Cell Carcinoma  06/2005, 07/2005   nasal tip and reconstruction   BREAST CYST ASPIRATION     CARPAL TUNNEL RELEASE  07/1993   both hands   COLONOSCOPY     HEMORROIDECTOMY  July 2003   KNEE ARTHROPLASTY  09/2001, 05/2003   left knee, chondromalasia   KNEE ARTHROSCOPY  jan 2005   Right knee, tisse release, chrondromalacia   REPLACEMENT TOTAL KNEE  Nov 2005   Left    THYROID LOBECTOMY  01/2005   malignant area removed from right lobe   THYROID SURGERY     thyroid lobe removal   TONSILLECTOMY  09/1972   TUBAL LIGATION  Sept 1999   Ulnar nerve entrapment  Feb 1995   left elbow   Past Medical History:  Diagnosis Date   Allergy    Anxiety    Cancer ([place])    cancerous nodules on thyroid   Cancer ([place])    basal cell Cancer-nose   Chronic pain syndrome    COPD (chronic obstructive pulmonary disease) (HCC)    Degeneration of lumbar or lumbosacral intervertebral disc    Depression    Facet syndrome, lumbar    GERD (gastroesophageal reflux disease)    Hyperlipidemia    Intervertebral lumbar disc disorder with myelopathy, lumbar region    Lumbosacral spondylosis without myelopathy    Migraine without aura, with intractable migraine, so stated, without mention of status migrainosus    Primary localized osteoarthrosis, lower leg    Thyroid disease    hypothyroid   Unspecified musculoskeletal disorders and symptoms referable to neck    cervical/trapezius   BP 119/76 (BP Location: Right Arm, Patient Position: Sitting, Cuff Size: Large)   Pulse 93   Temp 98.4 F (36.9 C) (Oral)   Ht 5\' 8"  (1.727 m)   Wt 233 lb (105.7 kg)   SpO2 92%   BMI 35.43 kg/m   Opioid Risk Score:   Fall Risk Score:  `1  Depression screen PHQ 2/9  Depression screen [facility] 2/9 07/07/2020 04/30/2020 04/13/2020 02/12/2020 01/21/2020 12/22/2019 08/29/2019  Decreased Interest 2 3 2 2 1 2  0  Down, Depressed, Hopeless 2 3 2 2 1 2  0  PHQ - 2 Score 4 6 4 4 2 4  0  Altered sleeping - 3 - - - - -  Tired, decreased energy  - 3 - - - - -  Change in appetite - 3 - - - - -  Feeling bad or failure about yourself  - 3 - - - - -  Trouble concentrating - 2 - - - - -  Moving slowly or fidgety/restless - 1 - - - - -  Suicidal thoughts - 0 - - - - -  PHQ-9 Score - 21 - - - - -  Difficult doing work/chores - Extremely dIfficult - - - - -  Some encounter information is confidential and restricted. Go to Review Flowsheets activity to see all data.  Some recent data might be hidden       Review of Systems  Constitutional: Negative.   HENT: Negative.    Eyes: Negative.   Respiratory: Negative.    Cardiovascular: Negative.   Gastrointestinal: Negative.   Endocrine: Negative.   Genitourinary: Negative.   Musculoskeletal:  Positive for back pain, gait problem and neck pain.       Pain in feet , Right knee pain , pain in both arms ,   Skin: Negative.   Allergic/Immunologic: Negative.   Hematological: Negative.   Psychiatric/Behavioral: Negative.        Objective:   Physical Exam Vitals and nursing note reviewed.  Constitutional:      Appearance: Normal appearance.  Cardiovascular:     Rate and Rhythm: Normal rate and regular rhythm.     Pulses: Normal pulses.     Heart sounds: Normal heart sounds.  Pulmonary:     Effort: Pulmonary effort is normal.     Breath sounds: Normal breath sounds.  Musculoskeletal:     Cervical back: Normal range of motion and neck supple.     Comments: Normal Muscle Bulk and Muscle Testing Reveals:  Upper Extremities: Full ROM and Muscle Strength 5/5 Thoracic Paraspinal Tenderness: T-7-T-9 Mainly Left Side Lumbar Paraspinal Tenderness: L-3-L-5 Lower Extremities: Decreased ROM and Muscle Strength 5/5 Bilateral Lower Extremities Flexion Produces Pain into her bilateral patellas Arises from Table slowly using cane for support Antalgic  Gait     Skin:    General: Skin is warm and dry.  Neurological:     Mental Status: She is alert and oriented to person, place, and time.   Psychiatric:        Mood and Affect: Mood normal.        Behavior: Behavior normal.         Assessment & Plan:  1.Chronic Bilateral Thoracic Back Pain/ Low Back Pain/ Lumbar Facet Arthropathy: Refilled MSIR 15 mg one tablet every 6 hours as needed #105 and Continue with Slow weaning of Morphine.   Refilled: MS Contin 60 mg and 30 mg Q 12 hours to equal   90 mg  #60.  Continue Gabapentin and Pamelor. 08/06/2020 We will continue the opioid monitoring program, this consists of regular clinic visits, examinations, urine drug screen, pill counts as well as use of [place] Controlled Substance Reporting system. A 12 month History has been reviewed on the [place] on 08/06/2020. 2. Degenerative Disk Disease:Encouraged Continue HEP as Tolertated  and heat therapy. Continue Current Medication Regime.08/06/2020 3. Bilateral Greater Trochanteric Bursitis:  Continue current treatment regimen with Heat and Ice Therapy. 08/06/2020 4. Osteoarthritis of Bilateral Knees: R>L Continue current treatment modality with home exercise program and heat therapy. 08/06/2020 5. Migraine Headaches: Continue with  Migraine Journal. Aimovig discontinued due to Hives. Continue Lamictal and Maxalt. Continue to Monitor.  08/06/2020 6. Smoking Cessation: She has quit smoking since 08/25/2016. Continue to Monitor. 08/06/2020 7.Constipation: Continue  Current medication regime with Linzess. 08/06/2020 8. Muscle Spasm: Continue current medication regime with Tizanidine.08/06/2020 9. DepressionAnxiety/ Panic Attacks: Continue current medication regimen and treatment modality.  Celexa.Lamictal  and  Counseling with [name] and  Dr. De Nurse Psychiatrist. 08/06/2020 10. RSD: Continue with current medication Regime with Gabapentin. 08/06/2020   F/U in 1 month

## 2020-08-14 LAB — TOXASSURE SELECT,+ANTIDEPR,UR

## 2020-08-23 ENCOUNTER — Ambulatory Visit (HOSPITAL_COMMUNITY): Payer: Medicare Other | Admitting: Licensed Clinical Social Worker

## 2020-08-25 ENCOUNTER — Ambulatory Visit (INDEPENDENT_AMBULATORY_CARE_PROVIDER_SITE_OTHER): Payer: Medicare Other | Admitting: Licensed Clinical Social Worker

## 2020-08-25 DIAGNOSIS — M5136 Other intervertebral disc degeneration, lumbar region: Secondary | ICD-10-CM

## 2020-08-25 DIAGNOSIS — G894 Chronic pain syndrome: Secondary | ICD-10-CM

## 2020-08-25 DIAGNOSIS — F331 Major depressive disorder, recurrent, moderate: Secondary | ICD-10-CM | POA: Diagnosis not present

## 2020-08-25 DIAGNOSIS — F411 Generalized anxiety disorder: Secondary | ICD-10-CM | POA: Diagnosis not present

## 2020-08-25 NOTE — Progress Notes (Signed)
Virtual Visit via Telephone Note  I connected with [name] on 08/25/20 at  8:00 AM EDT by telephone and verified that I am speaking with the correct person using two identifiers.  Location: Patient: home Provider: home office   I discussed the limitations, risks, security and privacy concerns of performing an evaluation and management service by telephone and the availability of in person appointments. I also discussed with the patient that there may be a patient responsible charge related to this service. The patient expressed understanding and agreed to proceed.   I discussed the assessment and treatment plan with the patient. The patient was provided an opportunity to ask questions and all were answered. The patient agreed with the plan and demonstrated an understanding of the instructions.   The patient was advised to call back or seek an in-person evaluation if the symptoms worsen or if the condition fails to improve as anticipated.  I provided 52 minutes of non-face-to-face time during this encounter.  THERAPIST PROGRESS NOTE  Session Time: 8:00 AM to 8:52 AM  Participation Level: Active  Behavioral Response: CasualAlertDysphoric and Euthymic  Type of Therapy: Individual Therapy  Treatment Goals addressed:  Decrease in depression, anxiety, stress management, coping Interventions: Solution Focused, Strength-based, Supportive, and Other: coping  Summary: [name] is a 60 y.o. female who presents with tired which is nothing new usual problems with sleeping due to pain and problems sleeping go hand in hand with pain. Patient also describes having a "chatter brain" that doesn't shut down" something therapist herself could relate to that makes it more difficult to fall asleep. Patient shares she always was a person who could stay up at night and sleep during the day. Discussed different topics like her neighbor who does a lot of good in her work and for the  community. Patient donates for these projects. Makes her feel good to help out. Has picked an organization different ones and donates every month for past 5 years. This year is [place]. Talked about helping [employer] and the work they have done to help people. Discussed other ways to look for where you can help out. Talked about patient giving to her family as well looking for things online and giving them some of her jewelry.      Therapist reviewed symptoms, facilitated expression of thoughts and feelings utilize this as treatment intervention to help patient process feelings around her stressors and struggles particularly with medical issues that impact different areas of functioning such as sleeping.  Assess helpful today to talk about giving the charities as it impacts in a positive way mood, noting patient giving to charities for a long time to positively reinforce this as a strengths.  Looking about and giving to others as a therapeutic intervention that helps with mood, and it also helps with self-esteem and helps [place] to focus on others get out of her own thoughts which can be helpful sometimes.  Assess very positive session with patient discussing ways she and other people give to others.  Therapist provided active listening, open questions, supportive interventions. Suicidal/Homicidal: No  Plan: Return again in 2 weeks.2.  Continue to help process feelings to help with coping, learning and applying coping skills  Diagnosis: Axis I: major depressive disorder, recurrent, moderate, degenerative disc disease, chronic pain syndrome, generalized anxiety disorder     Axis II: No diagnosis    [name], LCSW 08/25/2020

## 2020-08-26 ENCOUNTER — Telehealth (HOSPITAL_COMMUNITY): Payer: Medicare Other | Admitting: Psychiatry

## 2020-08-27 ENCOUNTER — Other Ambulatory Visit: Payer: Self-pay

## 2020-08-27 ENCOUNTER — Telehealth: Payer: Self-pay | Admitting: *Deleted

## 2020-08-27 ENCOUNTER — Encounter (HOSPITAL_COMMUNITY): Payer: Self-pay | Admitting: Psychiatry

## 2020-08-27 ENCOUNTER — Telehealth (INDEPENDENT_AMBULATORY_CARE_PROVIDER_SITE_OTHER): Payer: Medicare Other | Admitting: Psychiatry

## 2020-08-27 DIAGNOSIS — G894 Chronic pain syndrome: Secondary | ICD-10-CM

## 2020-08-27 DIAGNOSIS — F331 Major depressive disorder, recurrent, moderate: Secondary | ICD-10-CM

## 2020-08-27 NOTE — Progress Notes (Signed)
[facility] Outpatient Follow up visit  Patient Identification: [name] MRN:  [id number] Date of Evaluation:  08/27/2020 Referral Source: primary care Chief Complaint:    follow up depression Visit Diagnosis:    ICD-10-CM   1. Major depressive disorder, recurrent episode, moderate (HCC)  F33.1     2. Chronic pain syndrome  G89.4      Virtual Visit via Telephone Note  I connected with [name] on 08/27/20 at 10:45 AM EDT by telephone and verified that I am speaking with the correct person using two identifiers.  Location: Patient: home Provider: home office   I discussed the limitations, risks, security and privacy concerns of performing an evaluation and management service by telephone and the availability of in person appointments. I also discussed with the patient that there may be a patient responsible charge related to this service. The patient expressed understanding and agreed to proceed.      I discussed the assessment and treatment plan with the patient. The patient was provided an opportunity to ask questions and all were answered. The patient agreed with the plan and demonstrated an understanding of the instructions.   The patient was advised to call back or seek an in-person evaluation if the symptoms worsen or if the condition fails to improve as anticipated.  I provided 12  minutes of non-face-to-face time during this encounter.      History of Present Illness:  60 years old white married female. Referred by pain clinic for counselling and depression  Patient suffers from chronic back condition multiple pain conditions including at the joints surgery in the past including knee replacement.  She takes morphine 4 times a day also on gabapentin.  She takes Lamictal for migraine    Pain effects mood,  Therapy helps, overall migraine can make her have a bad day  Celexa helps some depression and therapy  Takes gaba, celexa, lamictal from primary  care  States she is on Slow taper of morphine  Mostly getting therapy from our office, meds reviewed but pcp refills them  Stress related to biological daughter doesn't talk to her Patient has adapted daughter whom is close to her.   Modifying factor: husband, stays at home  Tolerating medications.    Aggravating F:multiple medical conditions , arthirits, Degenerative joints disease. Chronic back condition, migraine Limited movements   Past Psychiatric History: depression, anxiety, chronic pain  Previous Psychotropic Medications: Yes   Substance Abuse History in the last 12 months:  No.  On pain meds as prescribed by provider for arthritis  Past Medical History:  Past Medical History:  Diagnosis Date   Allergy    Anxiety    Cancer ([place])    cancerous nodules on thyroid   Cancer ([place])    basal cell Cancer-nose   Chronic pain syndrome    COPD (chronic obstructive pulmonary disease) (HCC)    Degeneration of lumbar or lumbosacral intervertebral disc    Depression    Facet syndrome, lumbar    GERD (gastroesophageal reflux disease)    Hyperlipidemia    Intervertebral lumbar disc disorder with myelopathy, lumbar region    Lumbosacral spondylosis without myelopathy    Migraine without aura, with intractable migraine, so stated, without mention of status migrainosus    Primary localized osteoarthrosis, lower leg    Thyroid disease    hypothyroid   Unspecified musculoskeletal disorders and symptoms referable to neck    cervical/trapezius    Past Surgical History:  Procedure Laterality Date  ABDOMINAL HYSTERECTOMY  May 2004   [place] Cell Carcinoma  06/2005, 07/2005   nasal tip and reconstruction   BREAST CYST ASPIRATION     CARPAL TUNNEL RELEASE  07/1993   both hands   COLONOSCOPY     HEMORROIDECTOMY  July 2003   KNEE ARTHROPLASTY  09/2001, 05/2003   left knee, chondromalasia   KNEE ARTHROSCOPY  jan 2005   Right knee, tisse release, chrondromalacia   REPLACEMENT TOTAL  KNEE  Nov 2005   Left    THYROID LOBECTOMY  01/2005   malignant area removed from right lobe   THYROID SURGERY     thyroid lobe removal   TONSILLECTOMY  09/1972   TUBAL LIGATION  Sept 1999   Ulnar nerve entrapment  Feb 1995   left elbow    Family Psychiatric History: mom : depression  Family History:  Family History  Problem Relation Age of Onset   Depression Mother    Hypertension Mother    Hypertension Father    Heart failure Father    Diabetes Father    Heart attack Father    Colon cancer Neg Hx    Esophageal cancer Neg Hx    Stomach cancer Neg Hx    Rectal cancer Neg Hx     Social History:   Social History   Socioeconomic History   Marital status: Married    Spouse name: [name]   Number of children: 1   Years of education: some college   Highest education level: 12th grade  Occupational History   Occupation: On disability    Employer: UNEMPLOYED  Tobacco Use   Smoking status: Former    Packs/day: 0.50    Types: Cigarettes    Quit date: 02/06/2016    Years since quitting: 4.5   Smokeless tobacco: Never  Vaping Use   Vaping Use: Not on file  Substance and Sexual Activity   Alcohol use: No    Alcohol/week: 0.0 standard drinks   Drug use: No   Sexual activity: Not Currently    Partners: Male  Other Topics Concern   Not on file  Social History Narrative   On disability. Lives with her husband. She has a adopted daughter. She likes to read in her free time. She does chair exercises for 30-45 mins everyday.   Social Determinants of Health   Financial Resource Strain: Low Risk    Difficulty of Paying Living Expenses: Not hard at all  Food Insecurity: No Food Insecurity   Worried About Charity fundraiser in the Last Year: Never true   [place] in the Last Year: Never true  Transportation Needs: No Transportation Needs   Lack of Transportation (Medical): No   Lack of Transportation (Non-Medical): No  Physical Activity: Inactive   Days of  Exercise per Week: 0 days   Minutes of Exercise per Session: 0 min  Stress: No Stress Concern Present   Feeling of Stress : Not at all  Social Connections: Socially Isolated   Frequency of Communication with Friends and Family: Never   Frequency of Social Gatherings with Friends and Family: Once a week   Attends Religious Services: Never   Marine scientist or Organizations: No   Attends Archivist Meetings: Never   Marital Status: Married      Allergies:   Allergies  Allergen Reactions   Aspirin Other (See Comments)    Abdominal bleeding    Ibuprofen Swelling   Imitrex [Sumatriptan Base] Other (  See Comments)    Drops BP    Norvasc [Amlodipine Besylate] Swelling   Nsaids Swelling   Tape     Skin blisters under surgical tape   Topamax [Topiramate] Other (See Comments)    Hair loss    Metabolic Disorder Labs: Lab Results  Component Value Date   HGBA1C 6.1 (H) 10/11/2017   MPG 128 10/11/2017   MPG 131 05/26/2015   No results found for: PROLACTIN Lab Results  Component Value Date   CHOL 131 10/11/2017   TRIG 162 (H) 10/11/2017   HDL 47 (L) 10/11/2017   CHOLHDL 2.8 10/11/2017   VLDL 46 (H) 05/26/2015   LDLCALC 60 10/11/2017   LDLCALC 67 05/26/2015     Current Medications: Current Outpatient Medications  Medication Sig Dispense Refill   Blood Glucose Monitoring Suppl (RELION CONFIRM GLUCOSE MONITOR) w/Device KIT by Does not apply route.     cefdinir (OMNICEF) 300 MG capsule      ciclopirox (PENLAC) 8 % solution Apply topically at bedtime. Apply over nail and surrounding skin. Apply daily over previous coat. After seven (7) days, may remove with alcohol and continue cycle. 6.6 mL 3   citalopram (CELEXA) 40 MG tablet TAKE 1 TABLET(40 MG) BY MOUTH AT BEDTIME 30 tablet 5   gabapentin (NEURONTIN) 800 MG tablet TAKE 1 TABLET(800 MG) BY MOUTH FOUR TIMES DAILY 120 tablet 5   lamoTRIgine (LAMICTAL) 25 MG tablet TAKE 1 TABLET(25 MG) BY MOUTH DAILY 30 tablet  3   levothyroxine (SYNTHROID) 25 MCG tablet TAKE 1 TABLET BY MOUTH DAILY BEFORE BREAKFAST 90 tablet 1   LINZESS 145 MCG CAPS capsule TAKE 1 CAPSULE(145 MCG) BY MOUTH DAILY 30 capsule 3   metFORMIN (GLUCOPHAGE-XR) 500 MG 24 hr tablet Take 1 tablet by mouth daily with supper.     morphine (MS CONTIN) 30 MG 12 hr tablet Take 1 tablet (30 mg total) by mouth every 12 (twelve) hours. 60 tablet 0   morphine (MS CONTIN) 60 MG 12 hr tablet Take 1 tablet (60 mg total) by mouth every 12 (twelve) hours. 60 tablet 0   morphine (MSIR) 15 MG tablet Take 1 tablet (15 mg total) by mouth every 6 (six) hours as needed for severe pain. 100 tablet 0   nortriptyline (PAMELOR) 50 MG capsule TAKE 2 CAPSULES BY MOUTH AT BEDTIME 180 capsule 5   pravastatin (PRAVACHOL) 40 MG tablet Take 1 tablet (40 mg total) by mouth daily. LAST REFILL MUST SCHEDULE AND KEEP APPOINTMENT LABS REQUIRED FOR REFILLS 30 tablet 0   promethazine (PHENERGAN) 12.5 MG tablet TAKE 1 TABLET(12.5 MG) BY MOUTH EVERY 12 HOURS 30 tablet 2   rizatriptan (MAXALT) 10 MG tablet TAKE 1 TABLET BY MOUTH AS NEEDED MAY REPEAT AFTER 2 HOURS AS NEEDED 30 tablet 5   tiZANidine (ZANAFLEX) 2 MG tablet TAKE 1 TABLET BY MOUTH THREE TIMES DAILY AS NEEDED FOR MUSCLE SPASMS 90 tablet 5   tolterodine (DETROL LA) 2 MG 24 hr capsule TAKE 1 CAPSULE(2 MG) BY MOUTH DAILY 30 capsule 1   tolterodine (DETROL LA) 4 MG 24 hr capsule TAKE 1 CAPSULE(4 MG) BY MOUTH DAILY 90 capsule 1   No current facility-administered medications for this visit.     Psychiatric Specialty Exam: Review of Systems  Constitutional:  Positive for malaise/fatigue.  Cardiovascular:  Negative for palpitations.  Musculoskeletal:  Positive for myalgias. Negative for neck pain.  Skin:  Negative for rash.   There were no vitals taken for this visit.There is no height or  weight on file to calculate BMI.  General Appearance:   Eye Contact:   Speech:  Slow  Volume:  Normal  Mood: somewhat subdued  Affect:  constricted  Thought Process:  Goal Directed  Orientation:  Full (Time, Place, and Person)  Thought Content:  Rumination  Suicidal Thoughts:  No  Homicidal Thoughts:  No  Memory:  Immediate;   Fair Recent;   Fair  Judgement:  Fair  Insight:  Fair  Psychomotor Activity:  Decreased  Concentration:  Concentration: Fair and Attention Span: Fair  Recall:  [employer] of Knowledge:Good  Language: Good  Akathisia:  No  Handed:  Right  AIMS (if indicated):    Assets:  Desire for Improvement  ADL's:  Intact  Cognition: WNL  Sleep:  variable    Treatment Plan Summary: Medication management and Plan as follows     MDD, moderate recurrent; somewhat subdued continue citalopram she is also on gabapentin but again pain and migraine affects mood continue therapy that would be more supportive she is getting weekly therapy that is helpful may consider increasing Lamictal to 2 tablets she will communicate with the primary care physician as well    GADsporadic fluctuates continue gabapentin and citalopram and therapy  Supportive husband  Chronic back pain and ARthritis: on meds by providers.   Fu 5-6 m. Questions addressed    Merian Capron, MD 7/22/202210:56 AM

## 2020-08-27 NOTE — Telephone Encounter (Signed)
Urine drug screen for this encounter is consistent for prescribed medication 

## 2020-08-30 ENCOUNTER — Other Ambulatory Visit: Payer: Self-pay | Admitting: Registered Nurse

## 2020-09-03 ENCOUNTER — Encounter (HOSPITAL_BASED_OUTPATIENT_CLINIC_OR_DEPARTMENT_OTHER): Payer: Medicare Other | Admitting: Registered Nurse

## 2020-09-03 ENCOUNTER — Other Ambulatory Visit: Payer: Self-pay

## 2020-09-03 ENCOUNTER — Encounter: Payer: Self-pay | Admitting: Registered Nurse

## 2020-09-03 VITALS — BP 105/75 | HR 103 | Temp 98.6°F | Ht 68.0 in | Wt 233.0 lb

## 2020-09-03 DIAGNOSIS — G8929 Other chronic pain: Secondary | ICD-10-CM | POA: Diagnosis not present

## 2020-09-03 DIAGNOSIS — M1711 Unilateral primary osteoarthritis, right knee: Secondary | ICD-10-CM | POA: Diagnosis not present

## 2020-09-03 DIAGNOSIS — M47816 Spondylosis without myelopathy or radiculopathy, lumbar region: Secondary | ICD-10-CM

## 2020-09-03 DIAGNOSIS — Z79899 Other long term (current) drug therapy: Secondary | ICD-10-CM | POA: Diagnosis not present

## 2020-09-03 DIAGNOSIS — G894 Chronic pain syndrome: Secondary | ICD-10-CM | POA: Diagnosis not present

## 2020-09-03 DIAGNOSIS — G43119 Migraine with aura, intractable, without status migrainosus: Secondary | ICD-10-CM | POA: Diagnosis not present

## 2020-09-03 DIAGNOSIS — F329 Major depressive disorder, single episode, unspecified: Secondary | ICD-10-CM | POA: Diagnosis not present

## 2020-09-03 DIAGNOSIS — Z79891 Long term (current) use of opiate analgesic: Secondary | ICD-10-CM

## 2020-09-03 DIAGNOSIS — M546 Pain in thoracic spine: Secondary | ICD-10-CM

## 2020-09-03 DIAGNOSIS — M5416 Radiculopathy, lumbar region: Secondary | ICD-10-CM | POA: Diagnosis not present

## 2020-09-03 DIAGNOSIS — M1712 Unilateral primary osteoarthritis, left knee: Secondary | ICD-10-CM | POA: Diagnosis not present

## 2020-09-03 DIAGNOSIS — Z5181 Encounter for therapeutic drug level monitoring: Secondary | ICD-10-CM

## 2020-09-03 DIAGNOSIS — M7062 Trochanteric bursitis, left hip: Secondary | ICD-10-CM | POA: Diagnosis not present

## 2020-09-03 DIAGNOSIS — G9050 Complex regional pain syndrome I, unspecified: Secondary | ICD-10-CM | POA: Diagnosis not present

## 2020-09-03 DIAGNOSIS — M7061 Trochanteric bursitis, right hip: Secondary | ICD-10-CM | POA: Diagnosis not present

## 2020-09-03 MED ORDER — LAMOTRIGINE 25 MG PO TABS: ORAL_TABLET | ORAL | 3 refills | Status: DC

## 2020-09-03 MED ORDER — MORPHINE SULFATE ER 30 MG PO TBCR: 30.0000 mg | EXTENDED_RELEASE_TABLET | Freq: Two times a day (BID) | ORAL | 0 refills | Status: DC

## 2020-09-03 MED ORDER — MORPHINE SULFATE ER 60 MG PO TBCR: 60.0000 mg | EXTENDED_RELEASE_TABLET | Freq: Two times a day (BID) | ORAL | 0 refills | Status: DC

## 2020-09-03 MED ORDER — MORPHINE SULFATE 15 MG PO TABS: 15.0000 mg | ORAL_TABLET | Freq: Four times a day (QID) | ORAL | 0 refills | Status: DC | PRN

## 2020-09-03 NOTE — Progress Notes (Signed)
Subjective:    Patient ID: [name], female    DOB: 05-17-1960, 60 y.o.   MRN: [id number]  HPI: [name] is a 60 y.o. female who returns for follow up appointment for chronic pain and medication refill. She states her pain is located in  her mid- back mainly left side, lower back pain radiating into her bilateral hips and bilateral knee pain R>L.He rates his pain 8. His current exercise regime is walking and performing stretching exercises.   [name] Morphine equivalent is 180.00 MME.   Last UDS was Performed on 08/06/2020, it was consistent.  Pain Inventory Average Pain 8 Pain Right Now 8 My pain is constant, sharp, burning, dull, stabbing, tingling, and aching  In the last 24 hours, has pain interfered with the following? General activity 10 Relation with others 10 Enjoyment of life 10 What TIME of day is your pain at its worst? varies Sleep (in general) Poor  Pain is worse with: walking, bending, and some activites Pain improves with: rest, heat/ice, therapy/exercise, pacing activities, medication, TENS, and injections Relief from Meds: 4  Family History  Problem Relation Age of Onset   Depression Mother    Hypertension Mother    Hypertension Father    Heart failure Father    Diabetes Father    Heart attack Father    Colon cancer Neg Hx    Esophageal cancer Neg Hx    Stomach cancer Neg Hx    Rectal cancer Neg Hx    Social History   Socioeconomic History   Marital status: Married    Spouse name: [name]   Number of children: 1   Years of education: some college   Highest education level: 12th grade  Occupational History   Occupation: On disability    Employer: UNEMPLOYED  Tobacco Use   Smoking status: Former    Packs/day: 0.50    Types: Cigarettes    Quit date: 02/06/2016    Years since quitting: 4.5   Smokeless tobacco: Never  Vaping Use   Vaping Use: Not on file  Substance and Sexual Activity   Alcohol use: No     Alcohol/week: 0.0 standard drinks   Drug use: No   Sexual activity: Not Currently    Partners: Male  Other Topics Concern   Not on file  Social History Narrative   On disability. Lives with her husband. She has a adopted daughter. She likes to read in her free time. She does chair exercises for 30-45 mins everyday.   Social Determinants of Health   Financial Resource Strain: Low Risk    Difficulty of Paying Living Expenses: Not hard at all  Food Insecurity: No Food Insecurity   Worried About Charity fundraiser in the Last Year: Never true   [place] in the Last Year: Never true  Transportation Needs: No Transportation Needs   Lack of Transportation (Medical): No   Lack of Transportation (Non-Medical): No  Physical Activity: Inactive   Days of Exercise per Week: 0 days   Minutes of Exercise per Session: 0 min  Stress: No Stress Concern Present   Feeling of Stress : Not at all  Social Connections: Socially Isolated   Frequency of Communication with Friends and Family: Never   Frequency of Social Gatherings with Friends and Family: Once a week   Attends Religious Services: Never   Marine scientist or Organizations: No   Attends Archivist Meetings: Never  Marital Status: Married   Past Surgical History:  Procedure Laterality Date   ABDOMINAL HYSTERECTOMY  May 2004   Basel Cell Carcinoma  06/2005, 07/2005   nasal tip and reconstruction   BREAST CYST ASPIRATION     CARPAL TUNNEL RELEASE  07/1993   both hands   COLONOSCOPY     HEMORROIDECTOMY  July 2003   KNEE ARTHROPLASTY  09/2001, 05/2003   left knee, chondromalasia   KNEE ARTHROSCOPY  jan 2005   Right knee, tisse release, chrondromalacia   REPLACEMENT TOTAL KNEE  Nov 2005   Left    THYROID LOBECTOMY  01/2005   malignant area removed from right lobe   THYROID SURGERY     thyroid lobe removal   TONSILLECTOMY  09/1972   TUBAL LIGATION  Sept 1999   Ulnar nerve entrapment  Feb 1995   left elbow    Past Surgical History:  Procedure Laterality Date   ABDOMINAL HYSTERECTOMY  May 2004   Basel Cell Carcinoma  06/2005, 07/2005   nasal tip and reconstruction   BREAST CYST ASPIRATION     CARPAL TUNNEL RELEASE  07/1993   both hands   COLONOSCOPY     HEMORROIDECTOMY  July 2003   KNEE ARTHROPLASTY  09/2001, 05/2003   left knee, chondromalasia   KNEE ARTHROSCOPY  jan 2005   Right knee, tisse release, chrondromalacia   REPLACEMENT TOTAL KNEE  Nov 2005   Left    THYROID LOBECTOMY  01/2005   malignant area removed from right lobe   THYROID SURGERY     thyroid lobe removal   TONSILLECTOMY  09/1972   TUBAL LIGATION  Sept 1999   Ulnar nerve entrapment  Feb 1995   left elbow   Past Medical History:  Diagnosis Date   Allergy    Anxiety    Cancer ([place])    cancerous nodules on thyroid   Cancer ([place])    basal cell Cancer-nose   Chronic pain syndrome    COPD (chronic obstructive pulmonary disease) (HCC)    Degeneration of lumbar or lumbosacral intervertebral disc    Depression    Facet syndrome, lumbar    GERD (gastroesophageal reflux disease)    Hyperlipidemia    Intervertebral lumbar disc disorder with myelopathy, lumbar region    Lumbosacral spondylosis without myelopathy    Migraine without aura, with intractable migraine, so stated, without mention of status migrainosus    Primary localized osteoarthrosis, lower leg    Thyroid disease    hypothyroid   Unspecified musculoskeletal disorders and symptoms referable to neck    cervical/trapezius   BP 105/75 (BP Location: Right Arm)   Pulse (!) 103   Temp 98.6 F (37 C) (Oral)   Ht '5\' 8"'$  (1.727 m)   Wt 233 lb (105.7 kg)   SpO2 92%   BMI 35.43 kg/m   Opioid Risk Score:   Fall Risk Score:  `1  Depression screen PHQ 2/9  Depression screen [facility] 2/9 08/06/2020 07/07/2020 04/30/2020 04/13/2020 02/12/2020 01/21/2020 12/22/2019  Decreased Interest '1 2 3 2 2 1 2  '$ Down, Depressed, Hopeless '1 2 3 2 2 1 2  '$ PHQ - 2 Score '2 4 6 4 4 2 4   '$ Altered sleeping - - 3 - - - -  Tired, decreased energy - - 3 - - - -  Change in appetite - - 3 - - - -  Feeling bad or failure about yourself  - - 3 - - - -  Trouble concentrating - -  2 - - - -  Moving slowly or fidgety/restless - - 1 - - - -  Suicidal thoughts - - 0 - - - -  PHQ-9 Score - - 21 - - - -  Difficult doing work/chores - - Extremely dIfficult - - - -  Some encounter information is confidential and restricted. Go to Review Flowsheets activity to see all data.  Some recent data might be hidden       Review of Systems  Constitutional: Negative.   HENT: Negative.    Eyes: Negative.   Respiratory: Negative.    Cardiovascular: Negative.   Gastrointestinal: Negative.   Endocrine: Negative.   Genitourinary: Negative.   Musculoskeletal:  Positive for back pain, gait problem and neck pain.       Right and left shoulder pain , pain in both arms , right knee pain ,  Skin: Negative.   Allergic/Immunologic: Negative.   Hematological: Negative.   Psychiatric/Behavioral: Negative.        Objective:   Physical Exam Vitals and nursing note reviewed.  Constitutional:      Appearance: Normal appearance.  Cardiovascular:     Rate and Rhythm: Normal rate and regular rhythm.     Pulses: Normal pulses.     Heart sounds: Normal heart sounds.  Pulmonary:     Effort: Pulmonary effort is normal.     Breath sounds: Normal breath sounds.  Musculoskeletal:     Cervical back: Normal range of motion and neck supple.     Comments: Normal Muscle Bulk and Muscle Testing Reveals:  Upper Extremities: Full ROM and Muscle Strength 5/5 Thoracic Paraspinal Tenderness: T-4-T-7 Mainly Left Side Lumbar Paraspinal Tenderness: L-4-L-5 Bilateral Greater Trochanter Tenderness Lower Extremities: Decreased ROM and Muscle Strength 5/5 Bilateral Lower Extremities Flexion Produces Pain into her Bilateral Lower Extremities Arises from Table slowly using cane for support Antalgic Gait     Skin:     General: Skin is warm and dry.  Neurological:     Mental Status: She is alert and oriented to person, place, and time.  Psychiatric:        Mood and Affect: Mood normal.        Behavior: Behavior normal.         Assessment & Plan:  1.Chronic Bilateral Thoracic Back Pain/ Low Back Pain/ Lumbar Facet Arthropathy: Refilled MSIR 15 mg one tablet every 6 hours as needed #105 and Continue with Slow weaning of Morphine.   Refilled: MS Contin 60 mg and 30 mg Q 12 hours to equal   90 mg  #60.  Continue Gabapentin and Pamelor. 09/03/2020 We will continue the opioid monitoring program, this consists of regular clinic visits, examinations, urine drug screen, pill counts as well as use of [place] Controlled Substance Reporting system. A 12 month History has been reviewed on the [place] on 09/03/2020. 2. Degenerative Disk Disease:Encouraged Continue HEP as Tolertated  and heat therapy. Continue Current Medication Regime.09/03/2020 3. Bilateral Greater Trochanteric Bursitis:  Continue current treatment regimen with Heat and Ice Therapy. 09/03/2020 4. Osteoarthritis of Bilateral Knees: R>L Continue current treatment modality with home exercise program and heat therapy. 09/03/2020 5. Migraine Headaches: Continue with  Migraine Journal. Aimovig discontinued due to Hives. Continue Lamictal and Maxalt. Continue to Monitor.  09/03/2020 6. Smoking Cessation: She has quit smoking since 08/25/2016. Continue to Monitor. 09/03/2020 7.Constipation: Continue  Current medication regime with Linzess. 09/03/2020 8. Muscle Spasm: Continue current medication regime with Tizanidine.09/03/2020 9. DepressionAnxiety/ Panic  Attacks: Continue current medication regimen and treatment modality.  Celexa.Lamictal  and  Counseling with [name] and  Dr. De Nurse Psychiatrist. 09/03/2020 10. RSD: Continue with current medication Regime with Gabapentin. 09/03/2020   F/U in 1 month

## 2020-09-06 ENCOUNTER — Ambulatory Visit (INDEPENDENT_AMBULATORY_CARE_PROVIDER_SITE_OTHER): Payer: Medicare Other | Admitting: Licensed Clinical Social Worker

## 2020-09-06 DIAGNOSIS — G894 Chronic pain syndrome: Secondary | ICD-10-CM | POA: Diagnosis not present

## 2020-09-06 DIAGNOSIS — F411 Generalized anxiety disorder: Secondary | ICD-10-CM

## 2020-09-06 DIAGNOSIS — F331 Major depressive disorder, recurrent, moderate: Secondary | ICD-10-CM | POA: Diagnosis not present

## 2020-09-06 DIAGNOSIS — M5136 Other intervertebral disc degeneration, lumbar region: Secondary | ICD-10-CM

## 2020-09-06 NOTE — Progress Notes (Signed)
Virtual Visit via Telephone Note  I connected with [name] on 09/06/20 at  8:00 AM EDT by telephone and verified that I am speaking with the correct person using two identifiers.  Location: Patient: home Provider: home office   I discussed the limitations, risks, security and privacy concerns of performing an evaluation and management service by telephone and the availability of in person appointments. I also discussed with the patient that there may be a patient responsible charge related to this service. The patient expressed understanding and agreed to proceed.  I discussed the assessment and treatment plan with the patient. The patient was provided an opportunity to ask questions and all were answered. The patient agreed with the plan and demonstrated an understanding of the instructions.   The patient was advised to call back or seek an in-person evaluation if the symptoms worsen or if the condition fails to improve as anticipated.  I provided 45 minutes of non-face-to-face time during this encounter.  THERAPIST PROGRESS NOTE  Session Time: 8:00 AM to 8:45 AM  Participation Level: Active  Behavioral Response: CasualAlertDepressed and Euthymic  Type of Therapy: Individual Therapy  Treatment Goals addressed:  Decrease in depression, anxiety, stress management, coping Interventions: Solution Focused, Strength-based, Supportive, Reframing, and Other: coping  Summary: [name] is a 60 y.o. female who presents with therapist sharing that she got sick and patient shared how she relates. She explains with pain deal with not having an appetite and not sleeping so you have to deal with your symptoms can't just go to sleep causes you to withdraw into yourself seems no way to go but down, "twenty years and still going down." Feels good that she lost 43 pounds and kept it off. Day before yesterday [name] and [name] came. They are moving back [place] and Bastian working  at [employer]. Love him the "only son I will ever had." May stay at patient's house until they find a place. Loves seeing them. Best felt about herself between ages [age]. Met [name] then. Felt good, loved job, visit friend and traveled a little bit. Talked about happy memories with her friends [name] and [name]. Still figuring out plans with [place] and [place].  [name] is going to stay with them as well as their animals for a little bit and [name] has to decide whether she is staying or going with [place]. Therapist noted positive relationship she has with therapist and identified other close connections with her sister [name] and [name].    Therapist reviewed symptoms, facilitated expression of thoughts and feelings and noted positive developments for patient of family who she has not seen coming to see her and will be seeing more frequently as they are moving to the [facility].  Emphasized this to help patient with positive perspective that helps with depression.  Also talked about happy memories she has had periods of her life where she was happy and friendships that she really enjoyed.  Assessed this helpful for mood to reflect on positive experiences she has had in her life.  Noted connection with therapist that also helps with mood therapist assesses with patient's isolation it helps to have a positive connection in her life such as therapy.  Therapist provided active listening, open questions, supportive interventions.  Suicidal/Homicidal: No  Plan: Return again in 2 weeks.2.Therapist work with patient on coping strategies for depression, supportive interventions.   Diagnosis: Axis I: major depressive disorder, recurrent, moderate, degenerative disc disease, chronic pain syndrome, generalized anxiety  disorder    Axis II: No diagnosis    Frances Register, LCSW 09/06/2020

## 2020-09-20 ENCOUNTER — Ambulatory Visit (INDEPENDENT_AMBULATORY_CARE_PROVIDER_SITE_OTHER): Payer: Medicare Other | Admitting: Licensed Clinical Social Worker

## 2020-09-20 DIAGNOSIS — F331 Major depressive disorder, recurrent, moderate: Secondary | ICD-10-CM

## 2020-09-20 DIAGNOSIS — G894 Chronic pain syndrome: Secondary | ICD-10-CM

## 2020-09-20 DIAGNOSIS — F411 Generalized anxiety disorder: Secondary | ICD-10-CM | POA: Diagnosis not present

## 2020-09-20 DIAGNOSIS — M5136 Other intervertebral disc degeneration, lumbar region: Secondary | ICD-10-CM
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
## 2020-09-27 ENCOUNTER — Other Ambulatory Visit: Payer: Self-pay | Admitting: Family Medicine

## 2020-09-27 DIAGNOSIS — N3281 Overactive bladder: Secondary | ICD-10-CM

## 2020-10-01 ENCOUNTER — Encounter: Payer: Medicare Other | Attending: Physical Medicine and Rehabilitation | Admitting: Registered Nurse

## 2020-10-01 ENCOUNTER — Encounter: Payer: Self-pay | Admitting: Registered Nurse

## 2020-10-01 ENCOUNTER — Other Ambulatory Visit: Payer: Self-pay

## 2020-10-01 VITALS — BP 119/78 | HR 100 | Temp 99.4°F | Ht 68.0 in | Wt 222.4 lb

## 2020-10-01 DIAGNOSIS — M1712 Unilateral primary osteoarthritis, left knee: Secondary | ICD-10-CM | POA: Diagnosis not present

## 2020-10-01 DIAGNOSIS — Z79899 Other long term (current) drug therapy: Secondary | ICD-10-CM | POA: Diagnosis not present

## 2020-10-01 DIAGNOSIS — M1711 Unilateral primary osteoarthritis, right knee: Secondary | ICD-10-CM | POA: Insufficient documentation

## 2020-10-01 DIAGNOSIS — G43119 Migraine with aura, intractable, without status migrainosus: Secondary | ICD-10-CM | POA: Insufficient documentation

## 2020-10-01 DIAGNOSIS — G9050 Complex regional pain syndrome I, unspecified: Secondary | ICD-10-CM | POA: Diagnosis not present

## 2020-10-01 DIAGNOSIS — M546 Pain in thoracic spine: Secondary | ICD-10-CM | POA: Diagnosis not present

## 2020-10-01 DIAGNOSIS — M47816 Spondylosis without myelopathy or radiculopathy, lumbar region: Secondary | ICD-10-CM | POA: Diagnosis not present

## 2020-10-01 DIAGNOSIS — Z5181 Encounter for therapeutic drug level monitoring: Secondary | ICD-10-CM | POA: Diagnosis not present

## 2020-10-01 DIAGNOSIS — G894 Chronic pain syndrome: Secondary | ICD-10-CM | POA: Diagnosis not present

## 2020-10-01 DIAGNOSIS — G8929 Other chronic pain: Secondary | ICD-10-CM | POA: Diagnosis not present

## 2020-10-01 DIAGNOSIS — Z79891 Long term (current) use of opiate analgesic: Secondary | ICD-10-CM | POA: Diagnosis not present

## 2020-10-01 MED ORDER — MORPHINE SULFATE ER 60 MG PO TBCR: 60.0000 mg | EXTENDED_RELEASE_TABLET | Freq: Two times a day (BID) | ORAL | 0 refills | Status: DC

## 2020-10-01 MED ORDER — MORPHINE SULFATE 15 MG PO TABS: 15.0000 mg | ORAL_TABLET | Freq: Four times a day (QID) | ORAL | 0 refills | Status: DC | PRN

## 2020-10-01 MED ORDER — MORPHINE SULFATE ER 30 MG PO TBCR: 30.0000 mg | EXTENDED_RELEASE_TABLET | Freq: Two times a day (BID) | ORAL | 0 refills | Status: DC

## 2020-10-01 NOTE — Progress Notes (Signed)
Subjective:    Patient ID: [name], female    DOB: 1960/11/22, 60 y.o.   MRN: [id number]  HPI: [name] is a 60 y.o. female who returns for follow up appointment for chronic pain and medication refill. She states her  pain is located in  in her  mid- back and bilateral knee pain. She rates her pain 8. Her current exercise regime is walking and performing stretching exercises.  [name] Morphine equivalent is 242.00 MME.   Last UDS was Performed on 08/06/2020, it was consistent.     Pain Inventory Average Pain 9 Pain Right Now 8 My pain is constant, sharp, burning, dull, stabbing, tingling, and aching  In the last 24 hours, has pain interfered with the following? General activity 10 Relation with others 10 Enjoyment of life 10 What TIME of day is your pain at its worst? morning , daytime, evening, and night Sleep (in general) Poor  Pain is worse with: walking, bending, standing, and some activites Pain improves with: rest, heat/ice, therapy/exercise, medication, TENS, and injections Relief from Meds: 6  Family History  Problem Relation Age of Onset   Depression Mother    Hypertension Mother    Hypertension Father    Heart failure Father    Diabetes Father    Heart attack Father    Colon cancer Neg Hx    Esophageal cancer Neg Hx    Stomach cancer Neg Hx    Rectal cancer Neg Hx    Social History   Socioeconomic History   Marital status: Married    Spouse name: [name]   Number of children: 1   Years of education: some college   Highest education level: 12th grade  Occupational History   Occupation: On disability    Employer: UNEMPLOYED  Tobacco Use   Smoking status: Former    Packs/day: 0.50    Types: Cigarettes    Quit date: 02/06/2016    Years since quitting: 4.6   Smokeless tobacco: Never  Vaping Use   Vaping Use: Not on file  Substance and Sexual Activity   Alcohol use: No    Alcohol/week: 0.0 standard drinks   Drug use: No    Sexual activity: Not Currently    Partners: Male  Other Topics Concern   Not on file  Social History Narrative   On disability. Lives with her husband. She has a adopted daughter. She likes to read in her free time. She does chair exercises for 30-45 mins everyday.   Social Determinants of Health   Financial Resource Strain: Low Risk    Difficulty of Paying Living Expenses: Not hard at all  Food Insecurity: No Food Insecurity   Worried About Charity fundraiser in the Last Year: Never true   [name] in the Last Year: Never true  Transportation Needs: No Transportation Needs   Lack of Transportation (Medical): No   Lack of Transportation (Non-Medical): No  Physical Activity: Inactive   Days of Exercise per Week: 0 days   Minutes of Exercise per Session: 0 min  Stress: No Stress Concern Present   Feeling of Stress : Not at all  Social Connections: Socially Isolated   Frequency of Communication with Friends and Family: Never   Frequency of Social Gatherings with Friends and Family: Once a week   Attends Religious Services: Never   Marine scientist or Organizations: No   Attends Archivist Meetings: Never   Marital Status:  Married   Past Surgical History:  Procedure Laterality Date   ABDOMINAL HYSTERECTOMY  May 2004   Basel Cell Carcinoma  06/2005, 07/2005   nasal tip and reconstruction   BREAST CYST ASPIRATION     CARPAL TUNNEL RELEASE  07/1993   both hands   COLONOSCOPY     HEMORROIDECTOMY  July 2003   KNEE ARTHROPLASTY  09/2001, 05/2003   left knee, chondromalasia   KNEE ARTHROSCOPY  jan 2005   Right knee, tisse release, chrondromalacia   REPLACEMENT TOTAL KNEE  Nov 2005   Left    THYROID LOBECTOMY  01/2005   malignant area removed from right lobe   THYROID SURGERY     thyroid lobe removal   TONSILLECTOMY  09/1972   TUBAL LIGATION  Sept 1999   Ulnar nerve entrapment  Feb 1995   left elbow   Past Surgical History:  Procedure Laterality Date    ABDOMINAL HYSTERECTOMY  May 2004   Basel Cell Carcinoma  06/2005, 07/2005   nasal tip and reconstruction   BREAST CYST ASPIRATION     CARPAL TUNNEL RELEASE  07/1993   both hands   COLONOSCOPY     HEMORROIDECTOMY  July 2003   KNEE ARTHROPLASTY  09/2001, 05/2003   left knee, chondromalasia   KNEE ARTHROSCOPY  jan 2005   Right knee, tisse release, chrondromalacia   REPLACEMENT TOTAL KNEE  Nov 2005   Left    THYROID LOBECTOMY  01/2005   malignant area removed from right lobe   THYROID SURGERY     thyroid lobe removal   TONSILLECTOMY  09/1972   TUBAL LIGATION  Sept 1999   Ulnar nerve entrapment  Feb 1995   left elbow   Past Medical History:  Diagnosis Date   Allergy    Anxiety    Cancer ([place])    cancerous nodules on thyroid   Cancer ([place])    basal cell Cancer-nose   Chronic pain syndrome    COPD (chronic obstructive pulmonary disease) (HCC)    Degeneration of lumbar or lumbosacral intervertebral disc    Depression    Facet syndrome, lumbar    GERD (gastroesophageal reflux disease)    Hyperlipidemia    Intervertebral lumbar disc disorder with myelopathy, lumbar region    Lumbosacral spondylosis without myelopathy    Migraine without aura, with intractable migraine, so stated, without mention of status migrainosus    Primary localized osteoarthrosis, lower leg    Thyroid disease    hypothyroid   Unspecified musculoskeletal disorders and symptoms referable to neck    cervical/trapezius   There were no vitals taken for this visit.  Opioid Risk Score:   Fall Risk Score:  `1  Depression screen PHQ 2/9  Depression screen [facility] 2/9 10/01/2020 09/03/2020 08/06/2020 07/07/2020 04/30/2020 04/13/2020 02/12/2020  Decreased Interest '2 1 1 2 3 2 2  '$ Down, Depressed, Hopeless '2 1 1 2 3 2 2  '$ PHQ - 2 Score '4 2 2 4 6 4 4  '$ Altered sleeping - - - - 3 - -  Tired, decreased energy - - - - 3 - -  Change in appetite - - - - 3 - -  Feeling bad or failure about yourself  - - - - 3 - -  Trouble  concentrating - - - - 2 - -  Moving slowly or fidgety/restless - - - - 1 - -  Suicidal thoughts - - - - 0 - -  PHQ-9 Score - - - - 21 - -  Difficult doing work/chores - - - - Extremely dIfficult - -  Some encounter information is confidential and restricted. Go to Review Flowsheets activity to see all data.  Some recent data might be hidden     Review of Systems  Constitutional: Negative.   HENT: Negative.    Eyes: Negative.   Respiratory: Negative.    Cardiovascular: Negative.   Gastrointestinal: Negative.   Endocrine: Negative.   Genitourinary: Negative.   Musculoskeletal:  Positive for back pain and gait problem.  Skin: Negative.   Allergic/Immunologic: Negative.   Hematological: Negative.   Psychiatric/Behavioral:  Positive for dysphoric mood.   All other systems reviewed and are negative.     Objective:   Physical Exam Vitals and nursing note reviewed.  Constitutional:      Appearance: Normal appearance.  Neck:     Comments: Cervical Paraspinal Tenderness: C-5-C-6 Cardiovascular:     Rate and Rhythm: Normal rate and regular rhythm.     Pulses: Normal pulses.     Heart sounds: Normal heart sounds.  Pulmonary:     Effort: Pulmonary effort is normal.     Breath sounds: Normal breath sounds.  Musculoskeletal:     Cervical back: Normal range of motion and neck supple.     Comments: Normal Muscle Bulk and Muscle Testing Reveals:  Upper Extremities: Full ROM and Muscle Strength 5/5  Thoracic Paraspinal Tenderness: T-7-T-9 Lumbar Paraspinal Tenderness: L-4-L-5 Lower Extremities: Full ROM and Muscle Strength 5/5 Arises from Table Slowly using cane for support Antalgic  Gait     Skin:    General: Skin is warm and dry.  Neurological:     Mental Status: She is alert and oriented to person, place, and time.  Psychiatric:        Mood and Affect: Mood normal.        Behavior: Behavior normal.         Assessment & Plan:  1.Chronic Bilateral Thoracic Back Pain/ Low  Back Pain/ Lumbar Facet Arthropathy: Refilled MSIR 15 mg one tablet every 6 hours as needed #105 and Continue with Slow weaning of Morphine.   Refilled: MS Contin 60 mg and 30 mg Q 12 hours to equal   90 mg  #60.  Continue Gabapentin and Pamelor. 10/01/2020 We will continue the opioid monitoring program, this consists of regular clinic visits, examinations, urine drug screen, pill counts as well as use of [place] Controlled Substance Reporting system. A 12 month History has been reviewed on the [place] on 10/01/2020. 2. Degenerative Disk Disease:Encouraged Continue HEP as Tolertated  and heat therapy. Continue Current Medication Regime.10/01/2020 3. Bilateral Greater Trochanteric Bursitis:  Continue current treatment regimen with Heat and Ice Therapy. 10/01/2020 4. Osteoarthritis of Bilateral Knees: R>L Continue current treatment modality with home exercise program and heat therapy. 10/01/2020 5. Migraine Headaches: Continue with  Migraine Journal. Aimovig discontinued due to Hives. Continue Lamictal and Maxalt. Continue to Monitor.  10/01/2020 6. Smoking Cessation: She has quit smoking since 08/25/2016. Continue to Monitor. 10/01/2020 7.Constipation: Continue  Current medication regime with Linzess. 10/01/2020 8. Muscle Spasm: Continue current medication regime with Tizanidine.10/01/2020 9. DepressionAnxiety/ Panic Attacks: Continue current medication regimen and treatment modality.  Celexa.Lamictal  and  Counseling with [name] and  Dr. De Nurse Psychiatrist. 10/01/2020 10. RSD: Continue with current medication Regime with Gabapentin. 10/01/2020   F/U in 1 month

## 2020-10-02 ENCOUNTER — Other Ambulatory Visit: Payer: Self-pay | Admitting: Registered Nurse

## 2020-10-04 ENCOUNTER — Ambulatory Visit (INDEPENDENT_AMBULATORY_CARE_PROVIDER_SITE_OTHER): Payer: Medicare Other | Admitting: Licensed Clinical Social Worker

## 2020-10-04 DIAGNOSIS — F331 Major depressive disorder, recurrent, moderate: Secondary | ICD-10-CM

## 2020-10-04 DIAGNOSIS — G894 Chronic pain syndrome: Secondary | ICD-10-CM | POA: Diagnosis not present

## 2020-10-04 DIAGNOSIS — F411 Generalized anxiety disorder: Secondary | ICD-10-CM

## 2020-10-04 DIAGNOSIS — M5136 Other intervertebral disc degeneration, lumbar region: Secondary | ICD-10-CM | POA: Diagnosis not present

## 2020-10-04 NOTE — Progress Notes (Signed)
Virtual Visit via Telephone Note  I connected with [name] on 10/04/20 at  8:00 AM EDT by telephone and verified that I am speaking with the correct person using two identifiers.  Location: Patient: home Provider: home office   I discussed the limitations, risks, security and privacy concerns of performing an evaluation and management service by telephone and the availability of in person appointments. I also discussed with the patient that there may be a patient responsible charge related to this service. The patient expressed understanding and agreed to proceed.   I discussed the assessment and treatment plan with the patient. The patient was provided an opportunity to ask questions and all were answered. The patient agreed with the plan and demonstrated an understanding of the instructions.   The patient was advised to call back or seek an in-person evaluation if the symptoms worsen or if the condition fails to improve as anticipated.  I provided 50 minutes of non-face-to-face time during this encounter.  THERAPIST PROGRESS NOTE  Session Time: 8:00 AM to 8:50 AM  Participation Level: Active  Behavioral Response: CasualAlertDepressed  Type of Therapy: Individual Therapy  Treatment Goals addressed:  Decrease in depression, anxiety, stress management, coping Interventions: Solution Focused, Strength-based, Supportive, Reframing, and Other: coping  Summary: [name] is a 60 y.o. female who presents with "hanging in there" had a lot of pain this week and this weekend. Dropped her 5 more pills. There is nothing she can do about it. The government trying to get them to get people on opiates to take them off, wean them off. Body supposedly as they wean them off body will remember how to making it themselves. Have patients weaned to nothing and they are doing great. Great but means nothing to her. Doesn't remember not hurting, even taking the medicine it just helps  her to tolerate the pain. For patient sweat like crazy, can't stand anything light as sheet touching her knee when sick like that, tremble, shake heads do loops counting the minutes until she can take the medicine. She calls it dope sick. 15 mg give for break through pain. Takes 90 mg for extended release every 12 hours. Used to take one every 6 hours and even then was going to ask for more pain medication when they told her about dropping. Relates is that pain gets worse have to take more medication to get the same relief. Same day ask for increase when they told her that they were going to have to cut her back. Talk about scaring her to death almost freaked out in office. Some things she knows she can't take. Insurance that won't pay for some of the new medicine. Buying medicine for headaches since 15 you would think that they would give her to her instead of buying more and more of the cheap medication. Insurance she guesses is cheaper to pay for cheap medicine then buy newer medicines. Pharmaceuticals making money from it. Get so low, shares if depression were money I would be a billionaire. Get so low sometimes.  Sees [employer] and wishes it was more like everyone getting along trying to better themselves making their life better by helping other people. Like patient making her donations every year. She donates $20 a month. This year doing [place]. Buying a series of silver dimes from 1920 to 1945. [name] collects coins and thought it would nice to give him for birthday. Talked about wanting to give to native  Americans because of the poverty in that community. She has some Seminole and Apache in her on father's side. Weepy eyed this morning when first called and crying gets her stuffed up. Makes her feel good when can to do things like give food to elderly Jewish people. Some of those people were in concentration camp. Talked about her memories her neighbors how they met and visiting  them in [place]. Used to go to concerts a lot. Won tickets from the radio. Talked about her favorite concerts loves the [place], Wynona Dove named some other bands.   Therapist reviewed symptoms, facilitated expression of thoughts and feelings and utilize this as important treatment intervention for patient to work through her feelings, identify them helping to de-escalate mood symptoms that today patient presented as depressed.  Was able to talk about weaning her off opiates which is a very painful and difficult thing for patient.  Validated her on how she was feeling.  We focused session on things that would help with mood including patient's charitable work, noted in this world where we see so many injustices helps [place] to contribute in a positive way helps [place] to help others also helps [place] to be better, helps with her mental health feeling better when we are helping others.  Focused on other happy memories for patient able to talk about things like her favorite bands, happy memories visiting her neighbors, positive memories that patient able to revisit that helps with mood.  Therapist provided active listening, open questions, supportive interventions.      Suicidal/Homicidal: No  Plan: Return again in 2 weeks.2.herapist work with patient on coping strategies for depression, supportive interventions.    Diagnosis: Axis I: major depressive disorder, recurrent, moderate, degenerative disc disease, chronic pain syndrome, generalized anxiety disorder    Axis II: No diagnosis    [name], LCSW 10/04/2020

## 2020-10-10 ENCOUNTER — Other Ambulatory Visit: Payer: Self-pay | Admitting: Registered Nurse

## 2020-10-18 ENCOUNTER — Ambulatory Visit (INDEPENDENT_AMBULATORY_CARE_PROVIDER_SITE_OTHER): Payer: Medicare Other | Admitting: Licensed Clinical Social Worker

## 2020-10-18 DIAGNOSIS — F331 Major depressive disorder, recurrent, moderate: Secondary | ICD-10-CM

## 2020-10-18 DIAGNOSIS — F411 Generalized anxiety disorder: Secondary | ICD-10-CM

## 2020-10-18 DIAGNOSIS — G894 Chronic pain syndrome: Secondary | ICD-10-CM

## 2020-10-18 DIAGNOSIS — M5136 Other intervertebral disc degeneration, lumbar region: Secondary | ICD-10-CM | POA: Diagnosis not present

## 2020-10-18 NOTE — Progress Notes (Signed)
Virtual Visit via Telephone Note  I connected with [name] on 10/18/20 at  8:00 AM EDT by telephone and verified that I am speaking with the correct person using two identifiers.  Location: Patient: home Provider: office   I discussed the limitations, risks, security and privacy concerns of performing an evaluation and management service by telephone and the availability of in person appointments. I also discussed with the patient that there may be a patient responsible charge related to this service. The patient expressed understanding and agreed to proceed.   I discussed the assessment and treatment plan with the patient. The patient was provided an opportunity to ask questions and all were answered. The patient agreed with the plan and demonstrated an understanding of the instructions.   The patient was advised to call back or seek an in-person evaluation if the symptoms worsen or if the condition fails to improve as anticipated.  I provided 50 minutes of non-face-to-face time during this encounter.  THERAPIST PROGRESS NOTE  Session Time: 8:00 AM to 8:50 AM  Participation Level: Active  Behavioral Response: CasualAlertDysphoric  Type of Therapy: Individual Therapy  Treatment Goals addressed:  Decrease in depression, anxiety, stress management, coping Interventions: Solution Focused, Strength-based, Supportive, and Other: coping  Summary: KALLEY THATCH is a 60 y.o. female who presents with has a headache and woke with it a little while ago. Talked about memories with grandmother learning to cook. Talked about cooking and things she liked to cook. Not eating as many meals and losing more pounds lost 54 lbs. Hasn't had any impact on pain issues. Knees hurt and hips and back always hurts. Losing weight is great. Can't do much exercise because everything hurts. Too much effort to go to pool no one to go with her. Talked about Petron being very helpful for her. Made a  page for the queen on Pinterest things like pictures and her hats, pictures of her life. 30,000 pictures. Trying to fix it up the boards and get sections set up. Talked about giving to charity and therapist pointed out that every one who does their part can help and patient is inspiring.  Talked about daughter [name] in more detail about where she lives and her life. Patient relates she Wonders if she will ever talk to her.  Therapist shared she remains hopeful about the situation and when therapist has not given up things  eventually things turn around.  Therapist reviewed symptoms, facilitated expression of thoughts and feelings, noted making progress on losing weight as meeting positive goals in this as a mood enhancer but also explored if this has a positive impact on her pain.  Guided patient in losing more weight may help with that.  In sessions talk about positive memories for patient as they have also help with mood by remembering positive experiences.  Reviewed other hobbies that keep her interest in provided positive feedback for engagement in these hobbies.  Noted patient giving to charities very positive quality and also helps with mental health helps [place] feel good to be good to others as well as helping other people.  Therapist provided active listening open questions, supportive interventions  Suicidal/Homicidal: No  Plan: Return again in 2 weeks.2.Therapist work with patient on coping strategies for depression, supportive interventions.   Diagnosis: Axis I: major depressive disorder, recurrent, moderate, degenerative disc disease, chronic pain syndrome, generalized anxiety disorder    Axis II: No diagnosis    [name], LCSW 10/18/2020

## 2020-10-22 ENCOUNTER — Other Ambulatory Visit: Payer: Self-pay

## 2020-10-22 ENCOUNTER — Encounter: Payer: Self-pay | Admitting: Registered Nurse

## 2020-10-22 ENCOUNTER — Encounter: Payer: Medicare Other | Attending: Physical Medicine and Rehabilitation | Admitting: Registered Nurse

## 2020-10-22 VITALS — BP 123/83 | HR 98 | Temp 98.8°F | Ht 68.0 in | Wt 233.0 lb

## 2020-10-22 DIAGNOSIS — Z5181 Encounter for therapeutic drug level monitoring: Secondary | ICD-10-CM | POA: Diagnosis not present

## 2020-10-22 DIAGNOSIS — G9050 Complex regional pain syndrome I, unspecified: Secondary | ICD-10-CM

## 2020-10-22 DIAGNOSIS — M7061 Trochanteric bursitis, right hip: Secondary | ICD-10-CM

## 2020-10-22 DIAGNOSIS — M47816 Spondylosis without myelopathy or radiculopathy, lumbar region: Secondary | ICD-10-CM | POA: Diagnosis not present

## 2020-10-22 DIAGNOSIS — M1712 Unilateral primary osteoarthritis, left knee: Secondary | ICD-10-CM | POA: Diagnosis not present

## 2020-10-22 DIAGNOSIS — G894 Chronic pain syndrome: Secondary | ICD-10-CM

## 2020-10-22 DIAGNOSIS — G43119 Migraine with aura, intractable, without status migrainosus: Secondary | ICD-10-CM

## 2020-10-22 DIAGNOSIS — M7062 Trochanteric bursitis, left hip: Secondary | ICD-10-CM | POA: Diagnosis not present

## 2020-10-22 DIAGNOSIS — M546 Pain in thoracic spine: Secondary | ICD-10-CM | POA: Diagnosis not present

## 2020-10-22 DIAGNOSIS — G8929 Other chronic pain: Secondary | ICD-10-CM | POA: Diagnosis not present

## 2020-10-22 DIAGNOSIS — Z79899 Other long term (current) drug therapy: Secondary | ICD-10-CM

## 2020-10-22 DIAGNOSIS — M1711 Unilateral primary osteoarthritis, right knee: Secondary | ICD-10-CM | POA: Diagnosis not present

## 2020-10-22 DIAGNOSIS — Z79891 Long term (current) use of opiate analgesic: Secondary | ICD-10-CM | POA: Diagnosis not present

## 2020-10-22 MED ORDER — MORPHINE SULFATE 15 MG PO TABS: 15.0000 mg | ORAL_TABLET | Freq: Four times a day (QID) | ORAL | 0 refills | Status: DC | PRN

## 2020-10-22 MED ORDER — MORPHINE SULFATE ER 30 MG PO TBCR: 30.0000 mg | EXTENDED_RELEASE_TABLET | Freq: Two times a day (BID) | ORAL | 0 refills | Status: DC

## 2020-10-22 MED ORDER — MORPHINE SULFATE ER 60 MG PO TBCR: 60.0000 mg | EXTENDED_RELEASE_TABLET | Freq: Two times a day (BID) | ORAL | 0 refills | Status: DC

## 2020-10-22 NOTE — Progress Notes (Signed)
Subjective:    Patient ID: [name], female    DOB: 1960-11-05, 60 y.o.   MRN: [id number]  HPI: [name] is a 60 y.o. female who returns for follow up appointment for chronic pain and medication refill. She states her pain is located in  her mid- lower back, bilateral hips L>R and bilateral knee pain R>L. She rates her pain 8. Her current exercise regime is walking and performing stretching exercises.  [name] Morphine equivalent is 240.00 MME.   Last UDS was Performed on 08/06/2020, it was consistent.      Pain Inventory Average Pain 9 Pain Right Now 8 My pain is constant, sharp, burning, dull, stabbing, tingling, and aching  In the last 24 hours, has pain interfered with the following? General activity 10 Relation with others 10 Enjoyment of life 10 What TIME of day is your pain at its worst? morning , daytime, evening, and night Sleep (in general) Poor  Pain is worse with: walking, bending, standing, and some activites Pain improves with: rest, heat/ice, medication, TENS, injections, and threapy Relief from Meds: 6  Family History  Problem Relation Age of Onset   Depression Mother    Hypertension Mother    Hypertension Father    Heart failure Father    Diabetes Father    Heart attack Father    Colon cancer Neg Hx    Esophageal cancer Neg Hx    Stomach cancer Neg Hx    Rectal cancer Neg Hx    Social History   Socioeconomic History   Marital status: Married    Spouse name: [name]   Number of children: 1   Years of education: some college   Highest education level: 12th grade  Occupational History   Occupation: On disability    Employer: UNEMPLOYED  Tobacco Use   Smoking status: Former    Packs/day: 0.50    Types: Cigarettes    Quit date: 02/06/2016    Years since quitting: 4.7   Smokeless tobacco: Never  Vaping Use   Vaping Use: Never used  Substance and Sexual Activity   Alcohol use: No    Alcohol/week: 0.0 standard drinks    Drug use: No   Sexual activity: Not Currently    Partners: Male  Other Topics Concern   Not on file  Social History Narrative   On disability. Lives with her husband. She has a adopted daughter. She likes to read in her free time. She does chair exercises for 30-45 mins everyday.   Social Determinants of Health   Financial Resource Strain: Low Risk    Difficulty of Paying Living Expenses: Not hard at all  Food Insecurity: No Food Insecurity   Worried About Charity fundraiser in the Last Year: Never true   [place] in the Last Year: Never true  Transportation Needs: No Transportation Needs   Lack of Transportation (Medical): No   Lack of Transportation (Non-Medical): No  Physical Activity: Inactive   Days of Exercise per Week: 0 days   Minutes of Exercise per Session: 0 min  Stress: No Stress Concern Present   Feeling of Stress : Not at all  Social Connections: Socially Isolated   Frequency of Communication with Friends and Family: Never   Frequency of Social Gatherings with Friends and Family: Once a week   Attends Religious Services: Never   Marine scientist or Organizations: No   Attends Archivist Meetings: Never  Marital Status: Married   Past Surgical History:  Procedure Laterality Date   ABDOMINAL HYSTERECTOMY  May 2004   Basel Cell Carcinoma  06/2005, 07/2005   nasal tip and reconstruction   BREAST CYST ASPIRATION     CARPAL TUNNEL RELEASE  07/1993   both hands   COLONOSCOPY     HEMORROIDECTOMY  July 2003   KNEE ARTHROPLASTY  09/2001, 05/2003   left knee, chondromalasia   KNEE ARTHROSCOPY  jan 2005   Right knee, tisse release, chrondromalacia   REPLACEMENT TOTAL KNEE  Nov 2005   Left    THYROID LOBECTOMY  01/2005   malignant area removed from right lobe   THYROID SURGERY     thyroid lobe removal   TONSILLECTOMY  09/1972   TUBAL LIGATION  Sept 1999   Ulnar nerve entrapment  Feb 1995   left elbow   Past Surgical History:  Procedure  Laterality Date   ABDOMINAL HYSTERECTOMY  May 2004   Basel Cell Carcinoma  06/2005, 07/2005   nasal tip and reconstruction   BREAST CYST ASPIRATION     CARPAL TUNNEL RELEASE  07/1993   both hands   COLONOSCOPY     HEMORROIDECTOMY  July 2003   KNEE ARTHROPLASTY  09/2001, 05/2003   left knee, chondromalasia   KNEE ARTHROSCOPY  jan 2005   Right knee, tisse release, chrondromalacia   REPLACEMENT TOTAL KNEE  Nov 2005   Left    THYROID LOBECTOMY  01/2005   malignant area removed from right lobe   THYROID SURGERY     thyroid lobe removal   TONSILLECTOMY  09/1972   TUBAL LIGATION  Sept 1999   Ulnar nerve entrapment  Feb 1995   left elbow   Past Medical History:  Diagnosis Date   Allergy    Anxiety    Cancer ([place])    cancerous nodules on thyroid   Cancer ([place])    basal cell Cancer-nose   Chronic pain syndrome    COPD (chronic obstructive pulmonary disease) (HCC)    Degeneration of lumbar or lumbosacral intervertebral disc    Depression    Facet syndrome, lumbar    GERD (gastroesophageal reflux disease)    Hyperlipidemia    Intervertebral lumbar disc disorder with myelopathy, lumbar region    Lumbosacral spondylosis without myelopathy    Migraine without aura, with intractable migraine, so stated, without mention of status migrainosus    Primary localized osteoarthrosis, lower leg    Thyroid disease    hypothyroid   Unspecified musculoskeletal disorders and symptoms referable to neck    cervical/trapezius   There were no vitals taken for this visit.  Opioid Risk Score:   Fall Risk Score:  `1  Depression screen PHQ 2/9  Depression screen [facility] 2/9 10/01/2020 10/01/2020 09/03/2020 08/06/2020 07/07/2020 04/30/2020 04/13/2020  Decreased Interest '1 2 1 1 2 3 2  '$ Down, Depressed, Hopeless '1 2 1 1 2 3 2  '$ PHQ - 2 Score '2 4 2 2 4 6 4  '$ Altered sleeping - - - - - 3 -  Tired, decreased energy - - - - - 3 -  Change in appetite - - - - - 3 -  Feeling bad or failure about yourself  - - - - - 3 -   Trouble concentrating - - - - - 2 -  Moving slowly or fidgety/restless - - - - - 1 -  Suicidal thoughts - - - - - 0 -  PHQ-9 Score - - - - -  21 -  Difficult doing work/chores - - - - - Extremely dIfficult -  Some encounter information is confidential and restricted. Go to Review Flowsheets activity to see all data.  Some recent data might be hidden    Review of Systems  Musculoskeletal:  Positive for arthralgias, back pain and gait problem.  All other systems reviewed and are negative.     Objective:   Physical Exam Vitals and nursing note reviewed.  Constitutional:      Appearance: Normal appearance.  Cardiovascular:     Rate and Rhythm: Normal rate and regular rhythm.     Pulses: Normal pulses.     Heart sounds: Normal heart sounds.  Pulmonary:     Effort: Pulmonary effort is normal.     Breath sounds: Normal breath sounds.  Musculoskeletal:     Cervical back: Normal range of motion and neck supple.     Comments: Normal Muscle Bulk and Muscle Testing Reveals:  Upper Extremities: Full ROM and Muscle Strength 5/5 Thoracic Paraspinal Tenderness: T-7T-9 Mainly Left Side   Lumbar Paraspinal Tenderness: L-3-L-5 Lower Extremities: Decreased ROM and Muscle Strength 5/5  Bilateral Lower Extremities Flexion Produces Pain into her Bilateral Patella's  Arises from Table Slowly using cane for support Antalgic Gait     Skin:    General: Skin is warm and dry.  Neurological:     Mental Status: She is alert and oriented to person, place, and time.  Psychiatric:        Mood and Affect: Mood normal.        Behavior: Behavior normal.        Assessment & Plan:  1.Chronic Bilateral Thoracic Back Pain/ Low Back Pain/ Lumbar Facet Arthropathy: Refilled MSIR 15 mg one tablet every 6 hours as needed #100 and Continue with Slow weaning of Morphine.   Refilled: MS Contin 60 mg and 30 mg Q 12 hours to equal   90 mg  #60.  Continue Gabapentin and Pamelor. 10/01/2020 We will continue the  opioid monitoring program, this consists of regular clinic visits, examinations, urine drug screen, pill counts as well as use of [place] Controlled Substance Reporting system. A 12 month History has been reviewed on the [place] on 10/22/2020. 2. Degenerative Disk Disease:Encouraged Continue HEP as Tolertated  and heat therapy. Continue Current Medication Regime.10/22/2020 3. Bilateral Greater Trochanteric Bursitis:  Continue current treatment regimen with Heat and Ice Therapy. 10/22/2020 4. Osteoarthritis of Bilateral Knees: R>L Continue current treatment modality with home exercise program and heat therapy. 10/22/2020 5. Migraine Headaches: Continue with  Migraine Journal. Aimovig discontinued due to Hives. Continue Lamictal and Maxalt. Continue to Monitor.  10/22/2020 6. Smoking Cessation: She has quit smoking since 08/25/2016. Continue to Monitor. 10/22/2020 7.Constipation: Continue  Current medication regime with Linzess. 10/22/2020 8. Muscle Spasm: Continue current medication regime with Tizanidine.10/22/2020 9. DepressionAnxiety/ Panic Attacks: Continue current medication regimen and treatment modality.  Celexa.Lamictal  and  Counseling with [name] and  Dr. De Nurse Psychiatrist. 10/22/2020 10. RSD: Continue with current medication Regime with Gabapentin. 10/22/2020   F/U in 1 month

## 2020-11-01 ENCOUNTER — Ambulatory Visit (INDEPENDENT_AMBULATORY_CARE_PROVIDER_SITE_OTHER): Payer: Medicare Other | Admitting: Licensed Clinical Social Worker

## 2020-11-01 DIAGNOSIS — F331 Major depressive disorder, recurrent, moderate: Secondary | ICD-10-CM

## 2020-11-01 DIAGNOSIS — F411 Generalized anxiety disorder: Secondary | ICD-10-CM

## 2020-11-01 DIAGNOSIS — G894 Chronic pain syndrome: Secondary | ICD-10-CM | POA: Diagnosis not present

## 2020-11-01 DIAGNOSIS — M5136 Other intervertebral disc degeneration, lumbar region: Secondary | ICD-10-CM
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
## 2020-11-02 ENCOUNTER — Ambulatory Visit: Payer: Medicare Other | Admitting: Registered Nurse

## 2020-11-22 ENCOUNTER — Ambulatory Visit (INDEPENDENT_AMBULATORY_CARE_PROVIDER_SITE_OTHER): Payer: Medicare Other | Admitting: Licensed Clinical Social Worker

## 2020-11-22 DIAGNOSIS — M5136 Other intervertebral disc degeneration, lumbar region: Secondary | ICD-10-CM

## 2020-11-22 DIAGNOSIS — F331 Major depressive disorder, recurrent, moderate: Secondary | ICD-10-CM

## 2020-11-22 DIAGNOSIS — G894 Chronic pain syndrome: Secondary | ICD-10-CM

## 2020-11-22 DIAGNOSIS — F411 Generalized anxiety disorder: Secondary | ICD-10-CM

## 2020-11-22 NOTE — Progress Notes (Signed)
Virtual Visit via Telephone Note  I connected with [name] on 11/22/20 at  8:00 AM EDT by telephone and verified that I am speaking with the correct person using two identifiers.  Location: Patient: home Provider: office   I discussed the limitations, risks, security and privacy concerns of performing an evaluation and management service by telephone and the availability of in person appointments. I also discussed with the patient that there may be a patient responsible charge related to this service. The patient expressed understanding and agreed to proceed.  I discussed the assessment and treatment plan with the patient. The patient was provided an opportunity to ask questions and all were answered. The patient agreed with the plan and demonstrated an understanding of the instructions.   The patient was advised to call back or seek an in-person evaluation if the symptoms worsen or if the condition fails to improve as anticipated.  I provided 54 minutes of non-face-to-face time during this encounter.  THERAPIST PROGRESS NOTE  Session Time: 8:00 AM to 8:54 AM  Participation Level: Active  Behavioral Response: CasualAlertDysphoric  Type of Therapy: Individual Therapy  Treatment Goals addressed:  Decrease in depression, anxiety, stress management, coping Interventions: Solution Focused, Strength-based, Supportive, and Other: coping  Summary: [name] is a 60 y.o. female who presents with up all night. Has a headache keeps her up at lot. Had is since a day before yesterday finally fell asleep Saturday morning woke up afternoon and had a headache. If wake up with one then the medication not helpful. Most of the time medication doesn't work or wake up and doesn't have a chance when wake up with one. If does get it to stop, gets signals headache is coming, takes the pill beforehand and if headache not gone can have one more. If the second one doesn't work there is  nothing she can do. If does stop it then will get it anyway in a day or two. It is inevitable. The medication that will help is too expensive. Talked about frustration of not being able to afford medications. At the same time patient understands the cost of doing the research is one of the reasons for that. Discussed difficulty of even cost of living now.  Updated therapist that [place] "The baby's [place]." Older sister's daughter, April, had her baby, didn't know that spur of the moment she got married in July. Discussed [name] sleeping with her and how comforting he is. Discussed day to day and in narrating a story about the cat watching for [facility] patient described the difficulty of managing things such as trouble with standing while trying to do what she needs to do such as make a sandwich for herself. Discussed aspects of relationship with husband, [name], been together almost 22 years and their day to day life together. Some of their arguments ways that she teases him, his attachment to Selina's cat and other animals. Sharing with therapist some of her day to day life.     Therapist reviewed symptoms, facilitated expression of thoughts and feelings, processed feelings as significant treatment intervention as patient is isolated therapy is a outlet for her to work through feelings related to both positive and negative aspects of her life.  Continue to process feelings related to medical stressors validating how difficult that is for patient.  Focus on positive instance and says for patient to appreciate that in her life when they happen significantly her dog Petron  and day today occurrences to get her attention and can be amusing.  Talked about significant relationship in her life that provide meaning for her.  Therapist provided active listening, open questions, supportive interventions. Suicidal/Homicidal: No  Plan: Return again in 2 weeks.2.Therapist work with patient on  coping strategies for depression, supportive interventions.   Diagnosis: Axis I: major depressive disorder, recurrent, moderate, degenerative disc disease, chronic pain syndrome, generalized anxiety disorder    Axis II: No diagnosis    [name], LCSW 11/22/2020

## 2020-11-26 ENCOUNTER — Telehealth: Payer: Self-pay

## 2020-11-26 ENCOUNTER — Encounter: Payer: Medicare Other | Attending: Physical Medicine and Rehabilitation | Admitting: Registered Nurse

## 2020-11-26 ENCOUNTER — Other Ambulatory Visit: Payer: Self-pay

## 2020-11-26 VITALS — BP 117/77 | HR 102 | Ht 68.0 in | Wt 226.6 lb

## 2020-11-26 DIAGNOSIS — M546 Pain in thoracic spine: Secondary | ICD-10-CM | POA: Diagnosis not present

## 2020-11-26 DIAGNOSIS — Z79899 Other long term (current) drug therapy: Secondary | ICD-10-CM

## 2020-11-26 DIAGNOSIS — M1711 Unilateral primary osteoarthritis, right knee: Secondary | ICD-10-CM

## 2020-11-26 DIAGNOSIS — G8929 Other chronic pain: Secondary | ICD-10-CM

## 2020-11-26 DIAGNOSIS — M1712 Unilateral primary osteoarthritis, left knee: Secondary | ICD-10-CM | POA: Diagnosis not present

## 2020-11-26 DIAGNOSIS — Z79891 Long term (current) use of opiate analgesic: Secondary | ICD-10-CM

## 2020-11-26 DIAGNOSIS — G9050 Complex regional pain syndrome I, unspecified: Secondary | ICD-10-CM

## 2020-11-26 DIAGNOSIS — G43119 Migraine with aura, intractable, without status migrainosus: Secondary | ICD-10-CM | POA: Diagnosis not present

## 2020-11-26 DIAGNOSIS — G894 Chronic pain syndrome: Secondary | ICD-10-CM

## 2020-11-26 DIAGNOSIS — Z5181 Encounter for therapeutic drug level monitoring: Secondary | ICD-10-CM

## 2020-11-26 DIAGNOSIS — M47816 Spondylosis without myelopathy or radiculopathy, lumbar region: Secondary | ICD-10-CM

## 2020-11-26 MED ORDER — MORPHINE SULFATE 15 MG PO TABS: 15.0000 mg | ORAL_TABLET | Freq: Four times a day (QID) | ORAL | 0 refills | Status: DC | PRN

## 2020-11-26 MED ORDER — MORPHINE SULFATE ER 30 MG PO TBCR: 30.0000 mg | EXTENDED_RELEASE_TABLET | Freq: Two times a day (BID) | ORAL | 0 refills | Status: DC

## 2020-11-26 MED ORDER — MORPHINE SULFATE ER 60 MG PO TBCR: 60.0000 mg | EXTENDED_RELEASE_TABLET | Freq: Two times a day (BID) | ORAL | 0 refills | Status: DC

## 2020-11-26 NOTE — Telephone Encounter (Signed)
Patient called and stated the pharmacy does not have her Morphine Sulfate IR 15 mg. She called earlier stating the [facility] in [place] was out of stock with the medication. She said it needs to be sent to [facility] on [address] in [place].

## 2020-11-26 NOTE — Telephone Encounter (Signed)
Patient called stating that [facility] is out of Morphine sulfate IR 15 mg. She asked to send it to [facility]. [address]. Called [facility] and cancelled prescription.

## 2020-11-26 NOTE — Progress Notes (Signed)
Subjective:    Patient ID: [name], female    DOB: 1960-12-04, 60 y.o.   MRN: [id number]  HPI: [name] is a 60 y.o. female who returns for follow up appointment for chronic pain and medication refill. She states her pain is located in her mid- lower back and bilateral knee pain. She rates her pain 8. Her current exercise regime is walking and performing stretching exercises.  [name] Morphine equivalent is 180.00 MME.   Last UDS was Performed on 08/06/2020, it was consisistent.    Pain Inventory Average Pain 9 Pain Right Now 8 My pain is constant, sharp, burning, dull, stabbing, tingling, and aching  In the last 24 hours, has pain interfered with the following? General activity 10 Relation with others 10 Enjoyment of life 10 What TIME of day is your pain at its worst? varies Sleep (in general) Poor  Pain is worse with: walking, bending, standing, and some activites Pain improves with: rest, heat/ice, therapy/exercise, medication, TENS, and injections Relief from Meds: 6  Family History  Problem Relation Age of Onset   Depression Mother    Hypertension Mother    Hypertension Father    Heart failure Father    Diabetes Father    Heart attack Father    Colon cancer Neg Hx    Esophageal cancer Neg Hx    Stomach cancer Neg Hx    Rectal cancer Neg Hx    Social History   Socioeconomic History   Marital status: Married    Spouse name: [name]   Number of children: 1   Years of education: some college   Highest education level: 12th grade  Occupational History   Occupation: On disability    Employer: UNEMPLOYED  Tobacco Use   Smoking status: Former    Packs/day: 0.50    Types: Cigarettes    Quit date: 02/06/2016    Years since quitting: 4.8   Smokeless tobacco: Never  Vaping Use   Vaping Use: Never used  Substance and Sexual Activity   Alcohol use: No    Alcohol/week: 0.0 standard drinks   Drug use: No   Sexual activity: Not  Currently    Partners: Male  Other Topics Concern   Not on file  Social History Narrative   On disability. Lives with her husband. She has a adopted daughter. She likes to read in her free time. She does chair exercises for 30-45 mins everyday.   Social Determinants of Health   Financial Resource Strain: Low Risk    Difficulty of Paying Living Expenses: Not hard at all  Food Insecurity: No Food Insecurity   Worried About Charity fundraiser in the Last Year: Never true   [place] in the Last Year: Never true  Transportation Needs: No Transportation Needs   Lack of Transportation (Medical): No   Lack of Transportation (Non-Medical): No  Physical Activity: Inactive   Days of Exercise per Week: 0 days   Minutes of Exercise per Session: 0 min  Stress: No Stress Concern Present   Feeling of Stress : Not at all  Social Connections: Socially Isolated   Frequency of Communication with Friends and Family: Never   Frequency of Social Gatherings with Friends and Family: Once a week   Attends Religious Services: Never   Marine scientist or Organizations: No   Attends Archivist Meetings: Never   Marital Status: Married   Past Surgical History:  Procedure Laterality Date  ABDOMINAL HYSTERECTOMY  May 2004   Basel Cell Carcinoma  06/2005, 07/2005   nasal tip and reconstruction   BREAST CYST ASPIRATION     CARPAL TUNNEL RELEASE  07/1993   both hands   COLONOSCOPY     HEMORROIDECTOMY  July 2003   KNEE ARTHROPLASTY  09/2001, 05/2003   left knee, chondromalasia   KNEE ARTHROSCOPY  jan 2005   Right knee, tisse release, chrondromalacia   REPLACEMENT TOTAL KNEE  Nov 2005   Left    THYROID LOBECTOMY  01/2005   malignant area removed from right lobe   THYROID SURGERY     thyroid lobe removal   TONSILLECTOMY  09/1972   TUBAL LIGATION  Sept 1999   Ulnar nerve entrapment  Feb 1995   left elbow   Past Surgical History:  Procedure Laterality Date   ABDOMINAL HYSTERECTOMY   May 2004   Basel Cell Carcinoma  06/2005, 07/2005   nasal tip and reconstruction   BREAST CYST ASPIRATION     CARPAL TUNNEL RELEASE  07/1993   both hands   COLONOSCOPY     HEMORROIDECTOMY  July 2003   KNEE ARTHROPLASTY  09/2001, 05/2003   left knee, chondromalasia   KNEE ARTHROSCOPY  jan 2005   Right knee, tisse release, chrondromalacia   REPLACEMENT TOTAL KNEE  Nov 2005   Left    THYROID LOBECTOMY  01/2005   malignant area removed from right lobe   THYROID SURGERY     thyroid lobe removal   TONSILLECTOMY  09/1972   TUBAL LIGATION  Sept 1999   Ulnar nerve entrapment  Feb 1995   left elbow   Past Medical History:  Diagnosis Date   Allergy    Anxiety    Cancer ([place])    cancerous nodules on thyroid   Cancer ([place])    basal cell Cancer-nose   Chronic pain syndrome    COPD (chronic obstructive pulmonary disease) (HCC)    Degeneration of lumbar or lumbosacral intervertebral disc    Depression    Facet syndrome, lumbar    GERD (gastroesophageal reflux disease)    Hyperlipidemia    Intervertebral lumbar disc disorder with myelopathy, lumbar region    Lumbosacral spondylosis without myelopathy    Migraine without aura, with intractable migraine, so stated, without mention of status migrainosus    Primary localized osteoarthrosis, lower leg    Thyroid disease    hypothyroid   Unspecified musculoskeletal disorders and symptoms referable to neck    cervical/trapezius   BP 117/77   Pulse (!) 102   Ht 5\' 8"  (1.727 m)   Wt 226 lb 9.6 oz (102.8 kg)   SpO2 93%   BMI 34.45 kg/m   Opioid Risk Score:   Fall Risk Score:  `1  Depression screen PHQ 2/9  Depression screen [facility] 2/9 10/22/2020 10/01/2020 10/01/2020 09/03/2020 08/06/2020 07/07/2020 04/30/2020  Decreased Interest 1 1 2 1 1 2 3   Down, Depressed, Hopeless 1 1 2 1 1 2 3   PHQ - 2 Score 2 2 4 2 2 4 6   Altered sleeping - - - - - - 3  Tired, decreased energy - - - - - - 3  Change in appetite - - - - - - 3  Feeling bad or failure  about yourself  - - - - - - 3  Trouble concentrating - - - - - - 2  Moving slowly or fidgety/restless - - - - - - 1  Suicidal thoughts - - - - - -  0  PHQ-9 Score - - - - - - 21  Difficult doing work/chores - - - - - - Extremely dIfficult  Some encounter information is confidential and restricted. Go to Review Flowsheets activity to see all data.  Some recent data might be hidden     Review of Systems  Musculoskeletal:  Positive for back pain and neck pain.       Bilateral upper leg pain(frontal) Bilateral elbow pain Bilateral wrist pain Right knee pain  All other systems reviewed and are negative.     Objective:   Physical Exam Vitals and nursing note reviewed.  Constitutional:      Appearance: Normal appearance.  Cardiovascular:     Rate and Rhythm: Normal rate and regular rhythm.     Pulses: Normal pulses.     Heart sounds: Normal heart sounds.  Pulmonary:     Effort: Pulmonary effort is normal.     Breath sounds: Normal breath sounds.  Musculoskeletal:     Cervical back: Normal range of motion and neck supple.     Comments: Normal Muscle Bulk and Muscle Testing Reveals:  Upper Extremities: Full ROM and Muscle Strength 5/5  Thoracic Paraspinal Tenderness: T-7-T-9 Lower Extremities: Decreased ROM and Muscle Strength 5/5 Bilateral Lower Extremities Flexion Produces Pain into her Bilateral Patellas Arises from Table Slowly using cane for support  Gait     Skin:    General: Skin is warm and dry.  Neurological:     Mental Status: She is alert and oriented to person, place, and time.  Psychiatric:        Mood and Affect: Mood normal.        Behavior: Behavior normal.         Assessment & Plan:  1.Chronic Bilateral Thoracic Back Pain/ Low Back Pain/ Lumbar Facet Arthropathy: Refilled MSIR 15 mg one tablet every 6 hours as needed #100 and Continue with Slow weaning of Morphine.   Refilled: MS Contin 60 mg and 30 mg Q 12 hours to equal   90 mg  #60.  Continue  Gabapentin and Pamelor. 11/26/2020 We will continue the opioid monitoring program, this consists of regular clinic visits, examinations, urine drug screen, pill counts as well as use of [place] Controlled Substance Reporting system. A 12 month History has been reviewed on the [place] on 11/26/2020. 2. Degenerative Disk Disease:Encouraged Continue HEP as Tolertated  and heat therapy. Continue Current Medication Regime.11/26/2020 3. Bilateral Greater Trochanteric Bursitis:  Continue current treatment regimen with Heat and Ice Therapy. 11/26/2020 4. Osteoarthritis of Bilateral Knees: R>L Continue current treatment modality with home exercise program and heat therapy. 11/26/2020 5. Migraine Headaches: Continue with  Migraine Journal. Aimovig discontinued due to Hives. Continue Lamictal and Maxalt. Continue to Monitor.  11/26/2020 6. Smoking Cessation: She has quit smoking since 08/25/2016. Continue to Monitor. 11/26/2020 7.Constipation: Continue  Current medication regime with Linzess. 11/26/2020 8. Muscle Spasm: Continue current medication regime with Tizanidine.11/26/2020 9. DepressionAnxiety/ Panic Attacks: Continue current medication regimen and treatment modality.  Celexa.Lamictal  and  Counseling with [name] and  Dr. De Nurse Psychiatrist. 11/26/2020 10. RSD: Continue with current medication Regime with Gabapentin. 11/26/2020   F/U in 1 month

## 2020-11-26 NOTE — Telephone Encounter (Signed)
PMP was Reviewed.  MSIR e- scribed today.

## 2020-11-27 MED ORDER — MORPHINE SULFATE 15 MG PO TABS: 15.0000 mg | ORAL_TABLET | Freq: Four times a day (QID) | ORAL | 0 refills | Status: DC | PRN

## 2020-11-27 NOTE — Telephone Encounter (Signed)
PMP was Reviewed.  Call was placed to [name], regarding her MSIR. MSIR was cancelled at [facility] and sent to [facility] in [place]. Ms. Gatchell is aware of the above and verbalizes understanding.

## 2020-11-29 ENCOUNTER — Encounter: Payer: Self-pay | Admitting: Registered Nurse

## 2020-12-06 ENCOUNTER — Encounter (HOSPITAL_COMMUNITY): Payer: Self-pay

## 2020-12-06 ENCOUNTER — Ambulatory Visit (INDEPENDENT_AMBULATORY_CARE_PROVIDER_SITE_OTHER): Payer: Medicare Other | Admitting: Licensed Clinical Social Worker

## 2020-12-06 DIAGNOSIS — F331 Major depressive disorder, recurrent, moderate: Secondary | ICD-10-CM

## 2020-12-06 DIAGNOSIS — M5136 Other intervertebral disc degeneration, lumbar region: Secondary | ICD-10-CM | POA: Diagnosis not present

## 2020-12-06 DIAGNOSIS — G894 Chronic pain syndrome: Secondary | ICD-10-CM | POA: Diagnosis not present

## 2020-12-06 DIAGNOSIS — F411 Generalized anxiety disorder: Secondary | ICD-10-CM
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
## 2020-12-06 NOTE — Plan of Care (Signed)
Patient participated in development of treatment plan

## 2020-12-20 ENCOUNTER — Ambulatory Visit (INDEPENDENT_AMBULATORY_CARE_PROVIDER_SITE_OTHER): Payer: Medicare Other | Admitting: Licensed Clinical Social Worker

## 2020-12-20 DIAGNOSIS — M5136 Other intervertebral disc degeneration, lumbar region: Secondary | ICD-10-CM

## 2020-12-20 DIAGNOSIS — F411 Generalized anxiety disorder: Secondary | ICD-10-CM

## 2020-12-20 DIAGNOSIS — F331 Major depressive disorder, recurrent, moderate: Secondary | ICD-10-CM

## 2020-12-20 DIAGNOSIS — G894 Chronic pain syndrome: Secondary | ICD-10-CM
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
## 2020-12-22 ENCOUNTER — Encounter: Payer: Medicare Other | Admitting: Registered Nurse

## 2020-12-23 ENCOUNTER — Other Ambulatory Visit: Payer: Self-pay

## 2020-12-23 ENCOUNTER — Encounter: Payer: Medicare Other | Attending: Physical Medicine and Rehabilitation | Admitting: Registered Nurse

## 2020-12-23 ENCOUNTER — Encounter: Payer: Self-pay | Admitting: Registered Nurse

## 2020-12-23 VITALS — BP 144/75 | HR 96 | Temp 99.3°F | Ht 68.0 in | Wt 234.6 lb

## 2020-12-23 DIAGNOSIS — Z79899 Other long term (current) drug therapy: Secondary | ICD-10-CM | POA: Insufficient documentation

## 2020-12-23 DIAGNOSIS — Z5181 Encounter for therapeutic drug level monitoring: Secondary | ICD-10-CM | POA: Insufficient documentation

## 2020-12-23 DIAGNOSIS — M47816 Spondylosis without myelopathy or radiculopathy, lumbar region: Secondary | ICD-10-CM | POA: Insufficient documentation

## 2020-12-23 DIAGNOSIS — G8929 Other chronic pain: Secondary | ICD-10-CM | POA: Diagnosis not present

## 2020-12-23 DIAGNOSIS — M1711 Unilateral primary osteoarthritis, right knee: Secondary | ICD-10-CM | POA: Insufficient documentation

## 2020-12-23 DIAGNOSIS — M546 Pain in thoracic spine: Secondary | ICD-10-CM | POA: Insufficient documentation

## 2020-12-23 DIAGNOSIS — G894 Chronic pain syndrome: Secondary | ICD-10-CM | POA: Diagnosis not present

## 2020-12-23 DIAGNOSIS — M7062 Trochanteric bursitis, left hip: Secondary | ICD-10-CM | POA: Diagnosis not present

## 2020-12-23 DIAGNOSIS — G43119 Migraine with aura, intractable, without status migrainosus: Secondary | ICD-10-CM | POA: Diagnosis not present

## 2020-12-23 DIAGNOSIS — M7061 Trochanteric bursitis, right hip: Secondary | ICD-10-CM | POA: Diagnosis not present

## 2020-12-23 DIAGNOSIS — G9050 Complex regional pain syndrome I, unspecified: Secondary | ICD-10-CM | POA: Diagnosis not present

## 2020-12-23 DIAGNOSIS — M1712 Unilateral primary osteoarthritis, left knee: Secondary | ICD-10-CM | POA: Diagnosis not present

## 2020-12-23 MED ORDER — MORPHINE SULFATE ER 30 MG PO TBCR: 30.0000 mg | EXTENDED_RELEASE_TABLET | Freq: Two times a day (BID) | ORAL | 0 refills | Status: DC

## 2020-12-23 MED ORDER — MORPHINE SULFATE 15 MG PO TABS: 15.0000 mg | ORAL_TABLET | Freq: Four times a day (QID) | ORAL | 0 refills | Status: DC | PRN

## 2020-12-23 MED ORDER — MORPHINE SULFATE ER 60 MG PO TBCR: 60.0000 mg | EXTENDED_RELEASE_TABLET | Freq: Two times a day (BID) | ORAL | 0 refills | Status: DC

## 2020-12-23 NOTE — Progress Notes (Signed)
Subjective:    Patient ID: [name], female    DOB: March 19, 1960, 60 y.o.   MRN: [id number]  HPI: [name] is a 60 y.o. female who returns for follow up appointment for chronic pain and medication refill. She states her pain is located in  her mid back mainly left side, bilateral hips and bilateral knee pain.She rates her pain 8. Her current exercise regime is walking and performing stretching exercises.  Ms. Beckers Morphine equivalent is 180.00 MME.   Last UDS was Performed on 08/06/2020, it was consistent.     Pain Inventory Average Pain 9 Pain Right Now 8 My pain is intermittent, constant, sharp, burning, dull, stabbing, tingling, and aching  In the last 24 hours, has pain interfered with the following? General activity 10 Relation with others 10 Enjoyment of life 10 What TIME of day is your pain at its worst? morning , daytime, evening, and night Sleep (in general) Poor  Pain is worse with: walking, bending, standing, and some activites Pain improves with: rest, heat/ice, therapy/exercise, medication, TENS, and injections Relief from Meds: 7  Family History  Problem Relation Age of Onset   Depression Mother    Hypertension Mother    Hypertension Father    Heart failure Father    Diabetes Father    Heart attack Father    Colon cancer Neg Hx    Esophageal cancer Neg Hx    Stomach cancer Neg Hx    Rectal cancer Neg Hx    Social History   Socioeconomic History   Marital status: Married    Spouse name: [name]   Number of children: 1   Years of education: some college   Highest education level: 12th grade  Occupational History   Occupation: On disability    Employer: UNEMPLOYED  Tobacco Use   Smoking status: Former    Packs/day: 0.50    Types: Cigarettes    Quit date: 02/06/2016    Years since quitting: 4.8   Smokeless tobacco: Never  Vaping Use   Vaping Use: Never used  Substance and Sexual Activity   Alcohol use: No     Alcohol/week: 0.0 standard drinks   Drug use: No   Sexual activity: Not Currently    Partners: Male  Other Topics Concern   Not on file  Social History Narrative   On disability. Lives with her husband. She has a adopted daughter. She likes to read in her free time. She does chair exercises for 30-45 mins everyday.   Social Determinants of Health   Financial Resource Strain: Low Risk    Difficulty of Paying Living Expenses: Not hard at all  Food Insecurity: No Food Insecurity   Worried About Charity fundraiser in the Last Year: Never true   [place] in the Last Year: Never true  Transportation Needs: No Transportation Needs   Lack of Transportation (Medical): No   Lack of Transportation (Non-Medical): No  Physical Activity: Inactive   Days of Exercise per Week: 0 days   Minutes of Exercise per Session: 0 min  Stress: No Stress Concern Present   Feeling of Stress : Not at all  Social Connections: Socially Isolated   Frequency of Communication with Friends and Family: Never   Frequency of Social Gatherings with Friends and Family: Once a week   Attends Religious Services: Never   Marine scientist or Organizations: No   Attends Archivist Meetings: Never   Marital  Status: Married   Past Surgical History:  Procedure Laterality Date   ABDOMINAL HYSTERECTOMY  May 2004   Basel Cell Carcinoma  06/2005, 07/2005   nasal tip and reconstruction   BREAST CYST ASPIRATION     CARPAL TUNNEL RELEASE  07/1993   both hands   COLONOSCOPY     HEMORROIDECTOMY  July 2003   KNEE ARTHROPLASTY  09/2001, 05/2003   left knee, chondromalasia   KNEE ARTHROSCOPY  jan 2005   Right knee, tisse release, chrondromalacia   REPLACEMENT TOTAL KNEE  Nov 2005   Left    THYROID LOBECTOMY  01/2005   malignant area removed from right lobe   THYROID SURGERY     thyroid lobe removal   TONSILLECTOMY  09/1972   TUBAL LIGATION  Sept 1999   Ulnar nerve entrapment  Feb 1995   left elbow    Past Surgical History:  Procedure Laterality Date   ABDOMINAL HYSTERECTOMY  May 2004   Basel Cell Carcinoma  06/2005, 07/2005   nasal tip and reconstruction   BREAST CYST ASPIRATION     CARPAL TUNNEL RELEASE  07/1993   both hands   COLONOSCOPY     HEMORROIDECTOMY  July 2003   KNEE ARTHROPLASTY  09/2001, 05/2003   left knee, chondromalasia   KNEE ARTHROSCOPY  jan 2005   Right knee, tisse release, chrondromalacia   REPLACEMENT TOTAL KNEE  Nov 2005   Left    THYROID LOBECTOMY  01/2005   malignant area removed from right lobe   THYROID SURGERY     thyroid lobe removal   TONSILLECTOMY  09/1972   TUBAL LIGATION  Sept 1999   Ulnar nerve entrapment  Feb 1995   left elbow   Past Medical History:  Diagnosis Date   Allergy    Anxiety    Cancer ([place])    cancerous nodules on thyroid   Cancer ([place])    basal cell Cancer-nose   Chronic pain syndrome    COPD (chronic obstructive pulmonary disease) (HCC)    Degeneration of lumbar or lumbosacral intervertebral disc    Depression    Facet syndrome, lumbar    GERD (gastroesophageal reflux disease)    Hyperlipidemia    Intervertebral lumbar disc disorder with myelopathy, lumbar region    Lumbosacral spondylosis without myelopathy    Migraine without aura, with intractable migraine, so stated, without mention of status migrainosus    Primary localized osteoarthrosis, lower leg    Thyroid disease    hypothyroid   Unspecified musculoskeletal disorders and symptoms referable to neck    cervical/trapezius   Ht 5\' 8"  (1.727 m)   Wt 234 lb 9.6 oz (106.4 kg)   BMI 35.67 kg/m   Opioid Risk Score:   Fall Risk Score:  `1  Depression screen PHQ 2/9  Depression screen [facility] 2/9 12/23/2020 10/22/2020 10/01/2020 10/01/2020 09/03/2020 08/06/2020 07/07/2020  Decreased Interest 0 1 1 2 1 1 2   Down, Depressed, Hopeless 0 1 1 2 1 1 2   PHQ - 2 Score 0 2 2 4 2 2 4   Altered sleeping - - - - - - -  Tired, decreased energy - - - - - - -  Change in appetite - -  - - - - -  Feeling bad or failure about yourself  - - - - - - -  Trouble concentrating - - - - - - -  Moving slowly or fidgety/restless - - - - - - -  Suicidal thoughts - - - - - - -  PHQ-9 Score - - - - - - -  Difficult doing work/chores - - - - - - -  Some encounter information is confidential and restricted. Go to Review Flowsheets activity to see all data.  Some recent data might be hidden     Review of Systems  Constitutional: Negative.   HENT: Negative.    Eyes: Negative.   Respiratory: Negative.    Cardiovascular: Negative.   Gastrointestinal: Negative.   Endocrine: Negative.   Genitourinary: Negative.   Musculoskeletal:  Positive for back pain and gait problem.  Skin: Negative.   Allergic/Immunologic: Negative.   Hematological: Negative.   Psychiatric/Behavioral:  Positive for dysphoric mood and sleep disturbance.       Objective:   Physical Exam Vitals and nursing note reviewed.  Constitutional:      Appearance: Normal appearance.  Cardiovascular:     Rate and Rhythm: Normal rate and regular rhythm.     Pulses: Normal pulses.     Heart sounds: Normal heart sounds.  Pulmonary:     Effort: Pulmonary effort is normal.     Breath sounds: Normal breath sounds.  Musculoskeletal:     Cervical back: Normal range of motion and neck supple.     Comments: Normal Muscle Bulk and Muscle Testing Reveals:  Upper Extremities: Full ROM and Muscle Strength 5/5 Thoracic Paraspinal Tenderness: T-7-T-9 Mainly Left Side  Lumbar Paraspinal Tenderness: L-3-L-5 Bilateral Greater Trochanter Tenderness Lower Extremities: Decreased ROM and Muscle Strength 5/5 Bilateral Lower Extremities Flexion Produces Pain into her Bilateral Patellas Arises from Table slowly using cane for support Antalgic gait Gait     Skin:    General: Skin is warm and dry.  Neurological:     Mental Status: She is alert and oriented to person, place, and time.  Psychiatric:        Mood and Affect: Mood  normal.        Behavior: Behavior normal.         Assessment & Plan:  1.Chronic Bilateral Thoracic Back Pain/ Low Back Pain/ Lumbar Facet Arthropathy: Refilled MSIR 15 mg one tablet every 6 hours as needed #100 and Continue with Slow weaning of Morphine.   Refilled: MS Contin 60 mg and 30 mg Q 12 hours to equal   90 mg  #60.  Continue Gabapentin and Pamelor. 12/23/2020 We will continue the opioid monitoring program, this consists of regular clinic visits, examinations, urine drug screen, pill counts as well as use of [place] Controlled Substance Reporting system. A 12 month History has been reviewed on the [place] on 12/23/2020. 2. Degenerative Disk Disease:Encouraged Continue HEP as Tolertated  and heat therapy. Continue Current Medication Regime.12/23/2020 3. Bilateral Greater Trochanteric Bursitis:  Continue current treatment regimen with Heat and Ice Therapy. 12/23/2020 4. Osteoarthritis of Bilateral Knees: R>L Continue current treatment modality with home exercise program and heat therapy. 12/23/2020 5. Migraine Headaches: Continue with  Migraine Journal. Aimovig discontinued due to Hives. Continue Lamictal and Maxalt. Continue to Monitor.  12/23/2020 6. Smoking Cessation: She has quit smoking since 08/25/2016. Continue to Monitor. 12/23/2020 7.Constipation: Continue  Current medication regime with Linzess. 12/23/2020 8. Muscle Spasm: Continue current medication regime with Tizanidine.12/23/2020 9. DepressionAnxiety/ Panic Attacks: Continue current medication regimen and treatment modality.  Celexa.Lamictal  and  Counseling with [name] and  Dr. De Nurse Psychiatrist. 12/23/2020 10. RSD: Continue with current medication Regime with Gabapentin. 12/23/2020   F/U in 1 month

## 2020-12-24 ENCOUNTER — Ambulatory Visit: Payer: Medicare Other | Admitting: Registered Nurse

## 2020-12-28 ENCOUNTER — Other Ambulatory Visit: Payer: Self-pay | Admitting: Family Medicine

## 2020-12-29 ENCOUNTER — Other Ambulatory Visit: Payer: Self-pay | Admitting: Registered Nurse

## 2021-01-03 ENCOUNTER — Ambulatory Visit (INDEPENDENT_AMBULATORY_CARE_PROVIDER_SITE_OTHER): Payer: Medicare Other | Admitting: Licensed Clinical Social Worker

## 2021-01-03 DIAGNOSIS — G894 Chronic pain syndrome: Secondary | ICD-10-CM | POA: Diagnosis not present

## 2021-01-03 DIAGNOSIS — F331 Major depressive disorder, recurrent, moderate: Secondary | ICD-10-CM

## 2021-01-03 DIAGNOSIS — M5136 Other intervertebral disc degeneration, lumbar region: Secondary | ICD-10-CM

## 2021-01-03 DIAGNOSIS — F411 Generalized anxiety disorder: Secondary | ICD-10-CM

## 2021-01-03 DIAGNOSIS — M51369 Other intervertebral disc degeneration, lumbar region without mention of lumbar back pain or lower extremity pain: Secondary | ICD-10-CM

## 2021-01-03 NOTE — Progress Notes (Signed)
Virtual Visit via Telephone Note  I connected with [name] on 01/03/21 at  8:00 AM EST by telephone and verified that I am speaking with the correct person using two identifiers.  Location: Patient: home Provider: office   I discussed the limitations, risks, security and privacy concerns of performing an evaluation and management service by telephone and the availability of in person appointments. I also discussed with the patient that there may be a patient responsible charge related to this service. The patient expressed understanding and agreed to proceed.    I discussed the assessment and treatment plan with the patient. The patient was provided an opportunity to ask questions and all were answered. The patient agreed with the plan and demonstrated an understanding of the instructions.   The patient was advised to call back or seek an in-person evaluation if the symptoms worsen or if the condition fails to improve as anticipated.  I provided 54 minutes of non-face-to-face time during this encounter.  THERAPIST PROGRESS NOTE  Session Time: 8:00 AM to 8:54 AM  Participation Level: Active  Behavioral Response: CasualAlertDysphoric  Type of Therapy: Individual Therapy  Treatment Goals addressed:  Decrease in depression, anxiety, stress management, coping Interventions: Solution Focused, Strength-based, Supportive, and Other: coping  Summary: [name] is a 60 y.o. female who presents with tired and sore throat lingering for several days not a fever. She has post navel drip, allergies in the fall. With her nasal spray and throat not fun last few days hasn't slept well. Don't take anything over the counter when worries about messing with medications. Don't want them to mess with pain medications. Looks forward to therapist call so doesn't want to end session early even though describes being sick. Talked about who in family she has to talk to, one sister [name]  (husband, [name], is an "ass") can talk forever and [name] not as much in common to have as much to talk about.  Used to bother her when did not hear from family. Everyone excited about the new baby. Child named [name]. Her sister [name], is a grandmother. Supposed to see them Friday had a headache didn't go for Thanksgiving at her house. Their family started to do it on Friday's with all the different celebrations with the family. Planned it but had the headache. [name] has a lung infection and coughing. Saturday was their 14st wedding anniversary. Sore throat almost a week. No celebrations for them. Discussed about what she is planning for Christmas. Normally go to [name]. Used to go to [name]. Too much for patient to handle with people coming over. Maybe do a celebration patient suggested not on Christmas like they do for Thanksgiving.  They are going for the day to see [name] on Christmas. Transitioned to talking about different life issues. The walking is the issue so don't go anywhere.  Therapist pointed out dissociating she cannot get out warm patient agreed (see below)patient shared she helped a lady with [employer] (app. To communicate with neighbors) who needed help with paying for food for dogs, needed help until her paycheck. Therapist noted the positive quality of being there when things are tough. Love to go to do things outside and walk worry about falling.  Especially with knees feel like broken glass fallen twice in last 4 years and two times took her to hospital and other times through broke her nose. Deadly fear of falling especially on knees can't get herself up and  [name] can't get her up. Humiliating to be out and wait for EMS. Why like being in bed, in adjustable bed and keep swelling down and can play with [name]. Like to sit on deck and read. Again afraid fall and deck is in not in good shape for her to be walking on as well.           Therapist reviewed symptoms,  facilitated expression of thoughts and feelings as treatment intervention to help patient processed through feelings related to stressors as well as positive things going on in her life both present future and past.  Noted reviewing some of these positive experiences are good and uplifting mood.  Explored some strategies patient could use for mood but limited because of walking issue and afraid to fall so limits patient to the extent does not go anywhere except doctors.  Therapist assesses therapy helpful for patient as support, helpful enhancing mood particularly given these limited options.  In discussion talked about some of patient's strengths including generosity to others particularly meaningful as she came through at a time when it was tough for this person therapist also waiting outpatients generosity every month to give to charity.  Therapist assesses patient strength in finding other options that help with mood which particularly noteworthy given her limited options due to mobility.  Simply her generosity in itself is a significant strength.  Therapist focused on holidays and patient's plan to keep her mind focused on some other things that she has to look forward to.  Therapist provided active listening open questions, supportive interventions.  Suicidal/Homicidal: No  Plan: Return again in 2 weeks.2.Therapist work with patient on coping strategies for depression, supportive interventions.   Diagnosis: Axis I: major depressive disorder, recurrent, moderate, degenerative disc disease, chronic pain syndrome, generalized anxiety disorder    Axis II: No diagnosis    [name], LCSW 01/03/2021

## 2021-01-19 ENCOUNTER — Ambulatory Visit (INDEPENDENT_AMBULATORY_CARE_PROVIDER_SITE_OTHER): Payer: Medicare Other | Admitting: Licensed Clinical Social Worker

## 2021-01-19 DIAGNOSIS — M5136 Other intervertebral disc degeneration, lumbar region: Secondary | ICD-10-CM

## 2021-01-19 DIAGNOSIS — F331 Major depressive disorder, recurrent, moderate: Secondary | ICD-10-CM | POA: Diagnosis not present

## 2021-01-19 DIAGNOSIS — G894 Chronic pain syndrome: Secondary | ICD-10-CM

## 2021-01-19 DIAGNOSIS — F411 Generalized anxiety disorder: Secondary | ICD-10-CM | POA: Diagnosis not present

## 2021-01-19 NOTE — Progress Notes (Signed)
Virtual Visit via Telephone Note ° °I connected with [name] on 01/19/21 at  8:00 AM EST by telephone and verified that I am speaking with the correct person using two identifiers. ° °Location: °Patient: home °Provider: office °  °I discussed the limitations, risks, security and privacy concerns of performing an evaluation and management service by telephone and the availability of in person appointments. I also discussed with the patient that there may be a patient responsible charge related to this service. The patient expressed understanding and agreed to proceed. ° °I discussed the assessment and treatment plan with the patient. The patient was provided an opportunity to ask questions and all were answered. The patient agreed with the plan and demonstrated an understanding of the instructions. °  °The patient was advised to call back or seek an in-person evaluation if the symptoms worsen or if the condition fails to improve as anticipated. ° °I provided 54 minutes of non-face-to-face time during this encounter. ° °THERAPIST PROGRESS NOTE ° °Session Time: 8:00 AM to 8:54 AM ° °Participation Level: Active ° °Behavioral Response: CasualAlertEuthymic in session though she relates again about anniversary of her father's death throughout the week ° °Type of Therapy: Individual Therapy ° °Treatment Goals addressed:  °Decrease in depression, anxiety, stress management, coping °Interventions: Solution Focused, Strength-based, Supportive, and Other: Coping ° °Summary: [name] is a 60 y.o. female who presents with trying to keep up spirits Dad died 10 years today. Thinking about him all week. Has had a hard time this week. Playing with [name] and wonders how she did without him. Talked about Barbara who comes supposed to be every two weeks but ends up being once a month. Guided patient in talking about Dad. Shares "we always close to him as much as far as I was far away from my mom."  Therapist  remembered something she had brought up in therapy that she would drive around with him and patient shares around big holidays, the big flower days, patient would run the flowers in, he would find out what they wanted. He would load the flowers and she would carry them in. 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 Had a fall off a truck. Landed flat on back. After was fine finished running her route. Met supervisor told him she had fallen. Good thing told him. Woke up the next morning and couldn't get out of bed. Was paralyzed and "scared the shit out of me". If hadn't met supervisor and told him at that time wouldn't have gotten workmen's compensation have to report on the day that it happened. Was out 4.5 months fractured a vertebrae in back, lower back sacrum higher 6. 5 inches than the other. Went to chiropractor. When released restricted in how long could stand or sit. Loved that job up to that time. When told Dad said he knew of a florist. Just moved in with Bert right before she got hurt. 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 Everyone happy to see you only two times that wasn't the case. Shared the stories. 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 Talked about memories working at the florist. Always been artistic. Got that from mom who was the artist. Always said patient was the quickest to pick up designing was there 16 years. Loved the job there. Hard job because of the jobs   that wrecked her knees, her body-same stance and turning the same way. Also her and [name] doing the [employer] business part-time. Did residential and factories. Was those jobs tore up her back and knees. Knees started her being crippled up, left replaced need the right one replaced. The left needs to be redone.   Therapist reviewed symptoms, facilitated expression of thoughts and feeling and note today is the  anniversary of her father's death 10 years ago.  Guided patient in talking about memories she has of him, things that she has gotten from him, positive memories of him.  Noted one of her favorite jobs was through her father telling her about the position at a floral shop.  Noted talking about different memories as positive experience for patient and present to relive the memories both for some positive experiences as well as memories of her father.  Noted in session significance of this relationship to her's particularly she noted closeness with him that she did not have with her mom.  Through talking about patient's history also of note her strengths her ability to adapt to learn things quickly.  Also history helps explain where she is currently with the different jobs that impacted her body.  This therapeutic in telling the story and understanding better her struggles and stressors now.  Therapist provided active listening open questions, supportive interventions Suicidal/Homicidal: No  Plan: Return again in 3 weeks.2.Therapist work with patient on coping strategies for depression, supportive interventions.   Diagnosis: Axis I: major depressive disorder, recurrent, moderate, degenerative disc disease, chronic pain syndrome, generalized anxiety disorder    Axis II: No diagnosis    [name], LCSW 01/19/2021

## 2021-01-21 ENCOUNTER — Other Ambulatory Visit: Payer: Self-pay | Admitting: Registered Nurse

## 2021-01-24 ENCOUNTER — Encounter: Payer: Medicare Other | Attending: Physical Medicine and Rehabilitation | Admitting: Registered Nurse

## 2021-01-24 ENCOUNTER — Other Ambulatory Visit: Payer: Self-pay

## 2021-01-24 ENCOUNTER — Telehealth: Payer: Self-pay | Admitting: Family Medicine

## 2021-01-24 ENCOUNTER — Encounter: Payer: Self-pay | Admitting: Registered Nurse

## 2021-01-24 VITALS — BP 141/90 | HR 104 | Ht 68.0 in | Wt 234.6 lb

## 2021-01-24 DIAGNOSIS — M1712 Unilateral primary osteoarthritis, left knee: Secondary | ICD-10-CM

## 2021-01-24 DIAGNOSIS — M7062 Trochanteric bursitis, left hip: Secondary | ICD-10-CM | POA: Insufficient documentation

## 2021-01-24 DIAGNOSIS — G8929 Other chronic pain: Secondary | ICD-10-CM | POA: Diagnosis not present

## 2021-01-24 DIAGNOSIS — M47816 Spondylosis without myelopathy or radiculopathy, lumbar region: Secondary | ICD-10-CM

## 2021-01-24 DIAGNOSIS — M546 Pain in thoracic spine: Secondary | ICD-10-CM | POA: Diagnosis not present

## 2021-01-24 DIAGNOSIS — G894 Chronic pain syndrome: Secondary | ICD-10-CM | POA: Insufficient documentation

## 2021-01-24 DIAGNOSIS — G9050 Complex regional pain syndrome I, unspecified: Secondary | ICD-10-CM

## 2021-01-24 DIAGNOSIS — G43119 Migraine with aura, intractable, without status migrainosus: Secondary | ICD-10-CM

## 2021-01-24 DIAGNOSIS — M1711 Unilateral primary osteoarthritis, right knee: Secondary | ICD-10-CM | POA: Insufficient documentation

## 2021-01-24 DIAGNOSIS — M7061 Trochanteric bursitis, right hip: Secondary | ICD-10-CM

## 2021-01-24 DIAGNOSIS — Z79899 Other long term (current) drug therapy: Secondary | ICD-10-CM | POA: Insufficient documentation

## 2021-01-24 DIAGNOSIS — Z5181 Encounter for therapeutic drug level monitoring: Secondary | ICD-10-CM | POA: Diagnosis not present

## 2021-01-24 DIAGNOSIS — M5136 Other intervertebral disc degeneration, lumbar region: Secondary | ICD-10-CM

## 2021-01-24 MED ORDER — TIZANIDINE HCL 2 MG PO TABS: ORAL_TABLET | ORAL | 5 refills | Status: DC

## 2021-01-24 MED ORDER — MORPHINE SULFATE ER 30 MG PO TBCR: 30.0000 mg | EXTENDED_RELEASE_TABLET | Freq: Two times a day (BID) | ORAL | 0 refills | Status: DC

## 2021-01-24 MED ORDER — GABAPENTIN 800 MG PO TABS: 800.0000 mg | ORAL_TABLET | Freq: Four times a day (QID) | ORAL | 5 refills | Status: DC

## 2021-01-24 MED ORDER — MORPHINE SULFATE 15 MG PO TABS: 15.0000 mg | ORAL_TABLET | Freq: Four times a day (QID) | ORAL | 0 refills | Status: DC | PRN

## 2021-01-24 MED ORDER — AMBULATORY NON FORMULARY MEDICATION: 0 refills | Status: DC

## 2021-01-24 MED ORDER — MORPHINE SULFATE ER 60 MG PO TBCR: 60.0000 mg | EXTENDED_RELEASE_TABLET | Freq: Two times a day (BID) | ORAL | 0 refills | Status: DC

## 2021-01-24 NOTE — Telephone Encounter (Signed)
Patients husband came in to leave a message for Dr. Madilyn Fireman. Patients husband stated that they just left Jasmeen's pain medicine doctor and he recommended that she be evaluated for a wheel chair or similar device. - LMR

## 2021-01-24 NOTE — Telephone Encounter (Signed)
I am more than happy to write a prescription for a wheelchair.  If she feels like she may need something customized and she may want to contact the vendor or company that she would like to use first and sometimes they have some paperwork that we can complete.

## 2021-01-24 NOTE — Telephone Encounter (Signed)
Rx printed,signed and placed up front

## 2021-01-24 NOTE — Telephone Encounter (Signed)
RX pended for wheelchair.  Pt's spouse will pick up RX tomorrow.  Charyl Bigger, CMA

## 2021-01-24 NOTE — Progress Notes (Signed)
Subjective:    Patient ID: [name], female    DOB: [birth year], 60 y.o.   MRN: [id number]  HPI: [name] is a 60 y.o. female who returns for follow up appointment for chronic pain and medication refill. She states her pain is located in her mid- lower back, bilateral hips and bilateral knee pain. She  rates her pain 8. Her current exercise regime is walking and performing stretching exercises.  [name] would like to speak with Dr Naaman Plummer regarding a new medication regimen for her migraines.Migraines not being controlled with current medication regimen and she had an allergic reaction to Aimovig. [name] was instructed to keep a headache journal and to chec her drug formulary, she verbalizes understanding.   [name] Morphine equivalent is 230.00 MME.   UDS ordered today.    Pain Inventory Average Pain 8 Pain Right Now 8 My pain is constant, sharp, burning, dull, stabbing, tingling, and aching  In the last 24 hours, has pain interfered with the following? General activity 10 Relation with others 10 Enjoyment of life 10 What TIME of day is your pain at its worst? morning , daytime, evening, and night Sleep (in general) Poor  Pain is worse with: walking, bending, standing, and some activites Pain improves with: rest, heat/ice, therapy/exercise, medication, TENS, and injections Relief from Meds: 6  Family History  Problem Relation Age of Onset   Depression Mother    Hypertension Mother    Hypertension Father    Heart failure Father    Diabetes Father    Heart attack Father    Colon cancer Neg Hx    Esophageal cancer Neg Hx    Stomach cancer Neg Hx    Rectal cancer Neg Hx    Social History   Socioeconomic History   Marital status: Married    Spouse name: [name]   Number of children: 1   Years of education: some college   Highest education level: 12th grade  Occupational History   Occupation: On disability    Employer: UNEMPLOYED   Tobacco Use   Smoking status: Former    Packs/day: 0.50    Types: Cigarettes    Quit date: 02/06/2016    Years since quitting: 4.9   Smokeless tobacco: Never  Vaping Use   Vaping Use: Never used  Substance and Sexual Activity   Alcohol use: No    Alcohol/week: 0.0 standard drinks   Drug use: No   Sexual activity: Not Currently    Partners: Male  Other Topics Concern   Not on file  Social History Narrative   On disability. Lives with her husband. She has a adopted daughter. She likes to read in her free time. She does chair exercises for 30-45 mins everyday.   Social Determinants of Health   Financial Resource Strain: Low Risk    Difficulty of Paying Living Expenses: Not hard at all  Food Insecurity: No Food Insecurity   Worried About Charity fundraiser in the Last Year: Never true   [place] in the Last Year: Never true  Transportation Needs: No Transportation Needs   Lack of Transportation (Medical): No   Lack of Transportation (Non-Medical): No  Physical Activity: Inactive   Days of Exercise per Week: 0 days   Minutes of Exercise per Session: 0 min  Stress: No Stress Concern Present   Feeling of Stress : Not at all  Social Connections: Socially Isolated   Frequency of Communication with  Friends and Family: Never   Frequency of Social Gatherings with Friends and Family: Once a week   Attends Religious Services: Never   Marine scientist or Organizations: No   Attends Archivist Meetings: Never   Marital Status: Married   Past Surgical History:  Procedure Laterality Date   ABDOMINAL HYSTERECTOMY  May 2004   [place] Cell Carcinoma  06/2005, 07/2005   nasal tip and reconstruction   BREAST CYST ASPIRATION     CARPAL TUNNEL RELEASE  07/1993   both hands   COLONOSCOPY     HEMORROIDECTOMY  July 2003   KNEE ARTHROPLASTY  09/2001, 05/2003   left knee, chondromalasia   KNEE ARTHROSCOPY  jan 2005   Right knee, tisse release, chrondromalacia    REPLACEMENT TOTAL KNEE  Nov 2005   Left    THYROID LOBECTOMY  01/2005   malignant area removed from right lobe   THYROID SURGERY     thyroid lobe removal   TONSILLECTOMY  09/1972   TUBAL LIGATION  Sept 1999   Ulnar nerve entrapment  Feb 1995   left elbow   Past Surgical History:  Procedure Laterality Date   ABDOMINAL HYSTERECTOMY  May 2004   [place] Cell Carcinoma  06/2005, 07/2005   nasal tip and reconstruction   BREAST CYST ASPIRATION     CARPAL TUNNEL RELEASE  07/1993   both hands   COLONOSCOPY     HEMORROIDECTOMY  July 2003   KNEE ARTHROPLASTY  09/2001, 05/2003   left knee, chondromalasia   KNEE ARTHROSCOPY  jan 2005   Right knee, tisse release, chrondromalacia   REPLACEMENT TOTAL KNEE  Nov 2005   Left    THYROID LOBECTOMY  01/2005   malignant area removed from right lobe   THYROID SURGERY     thyroid lobe removal   TONSILLECTOMY  09/1972   TUBAL LIGATION  Sept 1999   Ulnar nerve entrapment  Feb 1995   left elbow   Past Medical History:  Diagnosis Date   Allergy    Anxiety    Cancer ([place])    cancerous nodules on thyroid   Cancer ([place])    basal cell Cancer-nose   Chronic pain syndrome    COPD (chronic obstructive pulmonary disease) ([place])    Degeneration of lumbar or lumbosacral intervertebral disc    Depression    Facet syndrome, lumbar    GERD (gastroesophageal reflux disease)    Hyperlipidemia    Intervertebral lumbar disc disorder with myelopathy, lumbar region    Lumbosacral spondylosis without myelopathy    Migraine without aura, with intractable migraine, so stated, without mention of status migrainosus    Primary localized osteoarthrosis, lower leg    Thyroid disease    hypothyroid   Unspecified musculoskeletal disorders and symptoms referable to neck    cervical/trapezius   There were no vitals taken for this visit.  Opioid Risk Score:   Fall Risk Score:  `1  Depression screen PHQ 2/9  Depression screen [facility] 2/9 01/24/2021 12/23/2020 12/23/2020  10/22/2020 10/01/2020 10/01/2020 09/03/2020  Decreased Interest 1 0 0 1 1 2 1   Down, Depressed, Hopeless 1 3 0 1 1 2 1   PHQ - 2 Score 2 3 0 2 2 4 2   Altered sleeping - - - - - - -  Tired, decreased energy - - - - - - -  Change in appetite - - - - - - -  Feeling bad or failure about yourself  - - - - - - -  Trouble concentrating - - - - - - -  Moving slowly or fidgety/restless - - - - - - -  Suicidal thoughts - - - - - - -  PHQ-9 Score - - - - - - -  Difficult doing work/chores - - - - - - -  Some encounter information is confidential and restricted. Go to Review Flowsheets activity to see all data.  Some recent data might be hidden     Review of Systems  Constitutional: Negative.   HENT: Negative.    Eyes: Negative.   Respiratory: Negative.    Cardiovascular: Negative.   Gastrointestinal: Negative.   Endocrine: Negative.   Genitourinary: Negative.   Musculoskeletal:  Positive for back pain and gait problem.       Knee pain  Skin: Negative.   Allergic/Immunologic: Negative.   Hematological: Negative.   Psychiatric/Behavioral:  Positive for dysphoric mood.   All other systems reviewed and are negative.     Objective:   Physical Exam Vitals and nursing note reviewed.  Constitutional:      Appearance: Normal appearance.  Cardiovascular:     Rate and Rhythm: Normal rate and regular rhythm.     Pulses: Normal pulses.     Heart sounds: Normal heart sounds.  Pulmonary:     Effort: Pulmonary effort is normal.     Breath sounds: Normal breath sounds.  Musculoskeletal:     Cervical back: Normal range of motion and neck supple.     Comments: Normal Muscle Bulk and Muscle Testing Reveals:  Upper Extremities: Full ROM and Muscle Strength 5/5 Thoracic Paraspinal Tenderness: T-7-T-9 Lumbar Paraspinal Tenderness: L-1-L-2 Greater Trochanter Tenderness Lower Extremities: Decreased ROM and Muscle Strength 5/5 Bilateral Lower Extremities Flexion Produces Pain into her Bilateral  Patellas Arises from  Table Slowly using cane for support Antalgic Gait     Skin:    General: Skin is warm and dry.  Neurological:     Mental Status: She is alert and oriented to person, place, and time.  Psychiatric:        Mood and Affect: Mood normal.        Behavior: Behavior normal.         Assessment & Plan:  1.Chronic Bilateral Thoracic Back Pain/ Low Back Pain/ Lumbar Facet Arthropathy: Refilled MSIR 15 mg one tablet every 6 hours as needed #100 and Continue with Slow weaning of Morphine.   Refilled: MS Contin 60 mg and 30 mg Q 12 hours to equal   90 mg  #60.  Continue Gabapentin and Pamelor. 01/24/2021 We will continue the opioid monitoring program, this consists of regular clinic visits, examinations, urine drug screen, pill counts as well as use of [place] Controlled Substance Reporting system. A 12 month History has been reviewed on the [place] on 01/24/2021. 2. Degenerative Disk Disease:Encouraged Continue HEP as Tolertated  and heat therapy. Continue Current Medication Regime.01/24/2021 3. Bilateral Greater Trochanteric Bursitis:  Continue current treatment regimen with Heat and Ice Therapy. 01/24/2021 4. Osteoarthritis of Bilateral Knees: R>L Continue current treatment modality with home exercise program and heat therapy. 01/24/2021 5. Migraine Headaches: She has a scheduled appointment with Dr Naaman Plummer to discuss new treatment regimen. Continue with  Migraine Journal. Aimovig discontinued due to Hives. Continue Lamictal and Maxalt. Continue to Monitor.  01/24/2021 6. Smoking Cessation: She has quit smoking since 08/25/2016. Continue to Monitor. 01/24/2021 7.Constipation: Continue  Current medication regime with Linzess. 01/24/2021 8. Muscle Spasm: Continue current medication regime  with Tizanidine.01/24/2021 9. DepressionAnxiety/ Panic Attacks: Continue current medication regimen and treatment modality.  Celexa.Lamictal  and   Counseling with [name] and  Dr. De Nurse Psychiatrist. 01/24/2021 10. RSD: Continue with current medication Regime with Gabapentin. 01/24/2021   F/U in 1 month

## 2021-01-28 ENCOUNTER — Other Ambulatory Visit: Payer: Self-pay | Admitting: Registered Nurse

## 2021-02-03 LAB — TOXASSURE SELECT,+ANTIDEPR,UR

## 2021-02-08 ENCOUNTER — Telehealth: Payer: Self-pay | Admitting: *Deleted

## 2021-02-08 NOTE — Telephone Encounter (Signed)
Urine drug screen for this encounter is consistent for prescribed medication 

## 2021-02-11 ENCOUNTER — Ambulatory Visit (INDEPENDENT_AMBULATORY_CARE_PROVIDER_SITE_OTHER): Payer: Medicare Other | Admitting: Licensed Clinical Social Worker

## 2021-02-11 DIAGNOSIS — F331 Major depressive disorder, recurrent, moderate: Secondary | ICD-10-CM | POA: Diagnosis not present

## 2021-02-11 DIAGNOSIS — M5136 Other intervertebral disc degeneration, lumbar region: Secondary | ICD-10-CM | POA: Diagnosis not present

## 2021-02-11 DIAGNOSIS — G894 Chronic pain syndrome: Secondary | ICD-10-CM | POA: Diagnosis not present

## 2021-02-11 DIAGNOSIS — F411 Generalized anxiety disorder: Secondary | ICD-10-CM

## 2021-02-11 NOTE — Progress Notes (Signed)
Virtual Visit via Telephone Note  I connected with [name] on 02/11/21 at 10:00 AM EST by telephone and verified that I am speaking with the correct person using two identifiers.  Location: Patient: home Provider: home office   I discussed the limitations, risks, security and privacy concerns of performing an evaluation and management service by telephone and the availability of in person appointments. I also discussed with the patient that there may be a patient responsible charge related to this service. The patient expressed understanding and agreed to proceed.   I discussed the assessment and treatment plan with the patient. The patient was provided an opportunity to ask questions and all were answered. The patient agreed with the plan and demonstrated an understanding of the instructions.   The patient was advised to call back or seek an in-person evaluation if the symptoms worsen or if the condition fails to improve as anticipated.  I provided 54 minutes of non-face-to-face time during this encounter.  THERAPIST PROGRESS NOTE  Session Time: 10:00 AM to 10:54 AM  Participation Level: Active  Behavioral Response: CasualAlertpatient reports hard night with sleep but euthymic in session  Type of Therapy: Individual Therapy  Treatment Goals addressed:  Decrease in depression, anxiety, stress management, coping Interventions: Solution Focused, Strength-based, Supportive, and Other: coping  Summary: [name] is a 61 y.o. female who presents with not perky this morning didn't sleep well. They had a good day on Christmas long ride went to [facility] to visit [place], [place] and [place]. Patient was excited about going forget to put her teeth in. Left the food was going to take. Four hours up and back knees and legs swollen took two days to get it down worth it got to see girls and Billy.Transitioned to talking about family and patient on December 14 was a basket case  that day sucks. [name] died was so unexpected he had a heart attack he had been at rehab a couple months getting over it. Friday before he was to come home next day had a heart attack died in ambulance. Huge shock. [name] was playing near patient so talked about how supportive animals are patient says no matter how much pain she is in get migraines so bad it is awful he can make her laugh and smile every time just looking at his face. Can get her grinning after a couple of minutes. Makes patient feel better.  More about what both therapist and patient did on Christmas and therapist said first time watched "It's a [place] and patient she often wonders how her life  would be different if went to [employer] instead of having [name] not sure how different never been in ocean or any craft thinks would love it, loves being in the water. Dad called her "displaced mermaid". Thinks could handle a cruise could do with a wheelchair or a scooter. PCP-wrote a prescription for one. Reading on line about them. Can't use in the house. Can get around the house with cane. Wants [name] to do it fell twice this week. Back won't hurt as bad and won't lose balance. Hard head won't listen doesn't want to admit a problem saying it is weak patient says just needs extra stability. Problem admitting problem that he needs help. He stays on feet loses his air and can't catch his breath more on his feet worse it gets with cane doesn't give out so quickly. Helps patient with cane. Has her system to manage things in  the house. Talked about how the suggestion of wheelchair happened blood pressure went up giving a urine sample, was exhausted afterwards. Nurse found out how hard on her why made suggestion.    Therapist reviewed symptoms, facilitated expression of thoughts and feelings utilized this as treatment intervention as a way for patient to talk about holidays they were good for her did mention anniversary on the 14th that was difficult when her  father died.  Elevated patient how anniversaries can be hard sometimes helps to do a ritual that helps with that.  Noted positive patient went to spend time with her adopted daughter and family had family to spend Christmas with.  Talked about other subjects such as how supportive is her emotional support animal really helps her with coping it very critical times when she is in pain.  Patient shared also about other daily life activities therapist utilizing active listening open questions supportive interventions.  We did talk about wheelchair and patient seemed positive as well as therapist that that would open more worse for her in terms of getting out would be positive for her in that respect engaging in more activities outside the house. Suicidal/Homicidal: No  Plan: Return again in 2 weeks.2.Therapist work with patient on coping strategies for depression, supportive interventions.    Diagnosis: Axis I: major depressive disorder, recurrent, moderate, degenerative disc disease, chronic pain syndrome, generalized anxiety disorder     Axis II: No diagnosis    [name], LCSW 02/11/2021

## 2021-02-15 ENCOUNTER — Other Ambulatory Visit: Payer: Self-pay | Admitting: Family Medicine

## 2021-02-15 DIAGNOSIS — N3281 Overactive bladder: Secondary | ICD-10-CM

## 2021-02-16 NOTE — Telephone Encounter (Signed)
Pt has been scheduled for OV and labs for 02/24/2021.

## 2021-02-16 NOTE — Telephone Encounter (Signed)
Please call pt and advise her that she has not been seen by Dr. Madilyn Fireman in over a year and that she will need to have an OV and labs for refills. Unless she is being seen by urology. If that is the case she should contact their office for refills.  30 day supply sent for now.

## 2021-02-21 ENCOUNTER — Ambulatory Visit (HOSPITAL_COMMUNITY): Payer: Medicare Other | Admitting: Licensed Clinical Social Worker

## 2021-02-23 ENCOUNTER — Telehealth: Payer: Self-pay | Admitting: *Deleted

## 2021-02-23 ENCOUNTER — Encounter: Payer: Medicare Other | Attending: Physical Medicine and Rehabilitation | Admitting: Physical Medicine & Rehabilitation

## 2021-02-23 ENCOUNTER — Other Ambulatory Visit: Payer: Self-pay

## 2021-02-23 ENCOUNTER — Encounter: Payer: Self-pay | Admitting: *Deleted

## 2021-02-23 ENCOUNTER — Encounter: Payer: Self-pay | Admitting: Physical Medicine & Rehabilitation

## 2021-02-23 VITALS — BP 127/84 | HR 96 | Ht 68.0 in | Wt 227.0 lb

## 2021-02-23 DIAGNOSIS — M1711 Unilateral primary osteoarthritis, right knee: Secondary | ICD-10-CM | POA: Insufficient documentation

## 2021-02-23 DIAGNOSIS — Z79899 Other long term (current) drug therapy: Secondary | ICD-10-CM | POA: Diagnosis not present

## 2021-02-23 DIAGNOSIS — M5136 Other intervertebral disc degeneration, lumbar region: Secondary | ICD-10-CM | POA: Insufficient documentation

## 2021-02-23 DIAGNOSIS — G9050 Complex regional pain syndrome I, unspecified: Secondary | ICD-10-CM | POA: Diagnosis not present

## 2021-02-23 DIAGNOSIS — M1712 Unilateral primary osteoarthritis, left knee: Secondary | ICD-10-CM | POA: Diagnosis not present

## 2021-02-23 DIAGNOSIS — G43119 Migraine with aura, intractable, without status migrainosus: Secondary | ICD-10-CM | POA: Diagnosis not present

## 2021-02-23 DIAGNOSIS — Z5181 Encounter for therapeutic drug level monitoring: Secondary | ICD-10-CM | POA: Diagnosis not present

## 2021-02-23 DIAGNOSIS — G894 Chronic pain syndrome: Secondary | ICD-10-CM | POA: Insufficient documentation

## 2021-02-23 MED ORDER — MORPHINE SULFATE ER 60 MG PO TBCR: 60.0000 mg | EXTENDED_RELEASE_TABLET | Freq: Two times a day (BID) | ORAL | 0 refills | Status: DC

## 2021-02-23 MED ORDER — LAMOTRIGINE 25 MG PO TABS: 25.0000 mg | ORAL_TABLET | Freq: Two times a day (BID) | ORAL | 5 refills | Status: DC

## 2021-02-23 MED ORDER — MORPHINE SULFATE 15 MG PO TABS: 15.0000 mg | ORAL_TABLET | Freq: Four times a day (QID) | ORAL | 0 refills | Status: DC | PRN

## 2021-02-23 MED ORDER — MORPHINE SULFATE ER 30 MG PO TBCR: 30.0000 mg | EXTENDED_RELEASE_TABLET | Freq: Two times a day (BID) | ORAL | 0 refills | Status: DC

## 2021-02-23 MED ORDER — ELETRIPTAN HYDROBROMIDE 20 MG PO TABS
20.0000 mg | ORAL_TABLET | ORAL | 2 refills | Status: DC | PRN
Start: 2021-02-23 — End: 2021-06-01

## 2021-02-23 NOTE — Progress Notes (Signed)
Subjective:    Patient ID: [name], female    DOB: [birth year], 61 y.o.   MRN: [id number]  HPI [name] is here in follow up of her chronic pain syndrome. I last saw her in February of 2021.  Her main issues include mid and low back pain as well as right greater than left knee pain and left hip pain.  Her left shoulder bothers her as well from time to time.  She still using a cane for balance.  She does do some exercising at home but is limited with mobility due to her knee symptoms.  She remains on MS Contin immediate release 15 mg up to 4 times daily as needed as well as MS Contin controlled release 90 mg every 12 hours.  There have been efforts to slowly decrease her dosing.  Generally has been afraid to decrease much further due to her overall persistent pain levels.  Her migraine headaches are persistent.  She is on Lamictal but only 25 mg at bedtime.  He had used Maxalt for breakthrough headaches but she found that it was no longer helping her.  We used Topamax in the past and she had issues with hair loss.  She has not tolerated Imitrex in the past.  She did a trial of Aimovig and ended up developing a significant rash.  She lost 40 pounds the past year.  She has been trying to eat better.  She has given up smoking as well.  Pain Inventory Average Pain 9 Pain Right Now 8 My pain is constant, sharp, burning, dull, stabbing, tingling, and aching  In the last 24 hours, has pain interfered with the following? General activity 10 Relation with others 10 Enjoyment of life 10 What TIME of day is your pain at its worst? morning , daytime, evening, and night Sleep (in general) Poor  Pain is worse with: walking, bending, standing, and some activites Pain improves with: rest, heat/ice, medication, TENS, injections, and therapy Relief from Meds: 8  Family History  Problem Relation Age of Onset   Depression Mother    Hypertension Mother    Hypertension Father    Heart failure  Father    Diabetes Father    Heart attack Father    Colon cancer Neg Hx    Esophageal cancer Neg Hx    Stomach cancer Neg Hx    Rectal cancer Neg Hx    Social History   Socioeconomic History   Marital status: Married    Spouse name: [name]   Number of children: 1   Years of education: some college   Highest education level: 12th grade  Occupational History   Occupation: On disability    Employer: UNEMPLOYED  Tobacco Use   Smoking status: Former    Packs/day: 0.50    Types: Cigarettes    Quit date: 02/06/2016    Years since quitting: 5.0   Smokeless tobacco: Never  Vaping Use   Vaping Use: Never used  Substance and Sexual Activity   Alcohol use: No    Alcohol/week: 0.0 standard drinks   Drug use: No   Sexual activity: Not Currently    Partners: Male  Other Topics Concern   Not on file  Social History Narrative   On disability. Lives with her husband. She has a adopted daughter. She likes to read in her free time. She does chair exercises for 30-45 mins everyday.   Social Determinants of Health   Financial Resource Strain: Low Risk  Difficulty of Paying Living Expenses: Not hard at all  Food Insecurity: No Food Insecurity   Worried About [place] in the Last Year: Never true   Ran Out of Food in the Last Year: Never true  Transportation Needs: No Transportation Needs   Lack of Transportation (Medical): No   Lack of Transportation (Non-Medical): No  Physical Activity: Inactive   Days of Exercise per Week: 0 days   Minutes of Exercise per Session: 0 min  Stress: No Stress Concern Present   Feeling of Stress : Not at all  Social Connections: Socially Isolated   Frequency of Communication with Friends and Family: Never   Frequency of Social Gatherings with Friends and Family: Once a week   Attends Religious Services: Never   Marine scientist or Organizations: No   Attends Archivist Meetings: Never   Marital Status: Married   Past  Surgical History:  Procedure Laterality Date   ABDOMINAL HYSTERECTOMY  May 2004   [place] Cell Carcinoma  06/2005, 07/2005   nasal tip and reconstruction   BREAST CYST ASPIRATION     CARPAL TUNNEL RELEASE  07/1993   both hands   COLONOSCOPY     HEMORROIDECTOMY  July 2003   KNEE ARTHROPLASTY  09/2001, 05/2003   left knee, chondromalasia   KNEE ARTHROSCOPY  jan 2005   Right knee, tisse release, chrondromalacia   REPLACEMENT TOTAL KNEE  Nov 2005   Left    THYROID LOBECTOMY  01/2005   malignant area removed from right lobe   THYROID SURGERY     thyroid lobe removal   TONSILLECTOMY  09/1972   TUBAL LIGATION  Sept 1999   Ulnar nerve entrapment  Feb 1995   left elbow   Past Surgical History:  Procedure Laterality Date   ABDOMINAL HYSTERECTOMY  May 2004   Basel Cell Carcinoma  06/2005, 07/2005   nasal tip and reconstruction   BREAST CYST ASPIRATION     CARPAL TUNNEL RELEASE  07/1993   both hands   COLONOSCOPY     HEMORROIDECTOMY  July 2003   KNEE ARTHROPLASTY  09/2001, 05/2003   left knee, chondromalasia   KNEE ARTHROSCOPY  jan 2005   Right knee, tisse release, chrondromalacia   REPLACEMENT TOTAL KNEE  Nov 2005   Left    THYROID LOBECTOMY  01/2005   malignant area removed from right lobe   THYROID SURGERY     thyroid lobe removal   TONSILLECTOMY  09/1972   TUBAL LIGATION  Sept 1999   Ulnar nerve entrapment  Feb 1995   left elbow   Past Medical History:  Diagnosis Date   Allergy    Anxiety    Cancer ([place])    cancerous nodules on thyroid   Cancer ([place])    basal cell Cancer-nose   Chronic pain syndrome    COPD (chronic obstructive pulmonary disease) ([place])    Degeneration of lumbar or lumbosacral intervertebral disc    Depression    Facet syndrome, lumbar    GERD (gastroesophageal reflux disease)    Hyperlipidemia    Intervertebral lumbar disc disorder with myelopathy, lumbar region    Lumbosacral spondylosis without myelopathy    Migraine without aura, with intractable  migraine, so stated, without mention of status migrainosus    Primary localized osteoarthrosis, lower leg    Thyroid disease    hypothyroid   Unspecified musculoskeletal disorders and symptoms referable to neck    cervical/trapezius   Ht 5\' 8"  (  1.727 m)    Wt 227 lb (103 kg)    BMI 34.52 kg/m   Opioid Risk Score:   Fall Risk Score:  `1  Depression screen PHQ 2/9  Depression screen [facility] 2/9 01/24/2021 12/23/2020 12/23/2020 10/22/2020 10/01/2020 10/01/2020 09/03/2020  Decreased Interest 1 0 0 1 1 2 1   Down, Depressed, Hopeless 1 3 0 1 1 2 1   PHQ - 2 Score 2 3 0 2 2 4 2   Altered sleeping - - - - - - -  Tired, decreased energy - - - - - - -  Change in appetite - - - - - - -  Feeling bad or failure about yourself  - - - - - - -  Trouble concentrating - - - - - - -  Moving slowly or fidgety/restless - - - - - - -  Suicidal thoughts - - - - - - -  PHQ-9 Score - - - - - - -  Difficult doing work/chores - - - - - - -  Some encounter information is confidential and restricted. Go to Review Flowsheets activity to see all data.  Some recent data might be hidden    Review of Systems  Musculoskeletal:  Positive for arthralgias, back pain and gait problem.  All other systems reviewed and are negative.     Objective:   Physical Exam  Gen: no distress, normal appearing, obese HEENT: oral mucosa pink and moist, NCAT Cardio: Reg rate Chest: normal effort, normal rate of breathing Abd: soft, non-distended Ext: no edema Psych: pleasant as always, normal affect Skin: intact Neuro:Alert and oriented x 3. Normal insight and awareness. Intact Memory. Normal language and speech. Cranial nerve exam unremarkable  Musculoskeletal: right >left knee pain, associated swelling, crepitus. Left hip tender. Mid and low back TTP with reduced rom. Antalgic RLE      Assessment & Plan:  1.Chronic Bilateral Thoracic Back Pain/ Low Back Pain/ Lumbar Facet Arthropathy:  -Refilled MSIR 15 mg one tablet every 6  hours as needed #100  -Slow weaning of Morphine.    Refilled: MS Contin 60 mg and 30 mg Q 12 hours to equal   90 mg  #60.  We will continue the controlled substance monitoring program, this consists of regular clinic visits, examinations, routine drug screening, pill counts as well as use of [place] Controlled Substance Reporting System. [place] today. Medication was refilled and a second prescription was sent to the patient's pharmacy for next month.      -Continue Gabapentin and Pamelor.    . 2. Degenerative Disk Disease:Encouraged Continue HEP as Tolertated  and heat therapy.    -HEP, ice and heat 3. Bilateral Greater Trochanteric Bursitis:   -ROM, ice/heat 4. Osteoarthritis of Bilateral Knees: R>L Continue current treatment modality with home exercise program and heat therapy.   5. Migraine Headaches: Increase Lamictal to 25 mg twice daily.  Likely will need further adjustment to consistently control headaches.   -For breakthrough we will change Maxalt to Relpax 20 mg daily as needed.   -Consider Nurtec trial 6. Smoking Cessation: She has quit smoking since 08/25/2016.  I am pleased that she has stayed away from cigarettes.   7.Constipation: Linzess has been effective.  Continue 8. Muscle Spasm: tizanidine and ROM 9. DepressionAnxiety/ Panic Attacks: Continue current medication regimen and treatment modality.  Celexa.Lamictal  and  Counseling with [name] and  Dr. De Nurse Psychiatrist.  2  -lamictal 10. RSD: Continue with current medication Regime with  Gabapentin.    Fifteen minutes of face to face patient care time were spent during this visit. All questions were encouraged and answered.  Follow up with NP in 2 mos .

## 2021-02-23 NOTE — Telephone Encounter (Signed)
Request Reference Number: [id number]. ELETRIPTAN TAB 20MG  is approved through 02/05/2022. Pharmacy and patient notified.

## 2021-02-23 NOTE — Patient Instructions (Addendum)
?  insurance for [place]?  Opioid hypersensitivity   Keep active!

## 2021-02-23 NOTE — Telephone Encounter (Signed)
Prior auth for Relpax (eletriptan hydrobromide) sent to insurance via CoverMyMeds.

## 2021-02-24 ENCOUNTER — Ambulatory Visit: Payer: Medicare Other | Admitting: Family Medicine

## 2021-02-28 ENCOUNTER — Telehealth (HOSPITAL_COMMUNITY): Payer: Medicare Other | Admitting: Psychiatry

## 2021-03-02 ENCOUNTER — Other Ambulatory Visit: Payer: Self-pay | Admitting: Registered Nurse

## 2021-03-03 ENCOUNTER — Ambulatory Visit: Payer: Medicare Other | Admitting: Family Medicine

## 2021-03-07 ENCOUNTER — Ambulatory Visit (HOSPITAL_COMMUNITY): Payer: Medicare Other | Admitting: Licensed Clinical Social Worker

## 2021-03-07 NOTE — Progress Notes (Signed)
Therapist contacted patient by phone phone not working. Other number does not take messages. Session a no show

## 2021-03-21 ENCOUNTER — Ambulatory Visit (HOSPITAL_COMMUNITY): Payer: Medicare Other | Admitting: Licensed Clinical Social Worker

## 2021-03-23 ENCOUNTER — Telehealth: Payer: Self-pay | Admitting: Registered Nurse

## 2021-03-23 ENCOUNTER — Other Ambulatory Visit: Payer: Self-pay

## 2021-03-23 ENCOUNTER — Encounter: Payer: Medicare Other | Attending: Physical Medicine and Rehabilitation | Admitting: Registered Nurse

## 2021-03-23 VITALS — BP 100/69 | HR 83 | Ht 68.0 in | Wt 226.0 lb

## 2021-03-23 DIAGNOSIS — G8929 Other chronic pain: Secondary | ICD-10-CM | POA: Diagnosis not present

## 2021-03-23 DIAGNOSIS — G43119 Migraine with aura, intractable, without status migrainosus: Secondary | ICD-10-CM | POA: Diagnosis not present

## 2021-03-23 DIAGNOSIS — M5136 Other intervertebral disc degeneration, lumbar region: Secondary | ICD-10-CM | POA: Diagnosis not present

## 2021-03-23 DIAGNOSIS — Z79899 Other long term (current) drug therapy: Secondary | ICD-10-CM

## 2021-03-23 DIAGNOSIS — G894 Chronic pain syndrome: Secondary | ICD-10-CM

## 2021-03-23 DIAGNOSIS — G9050 Complex regional pain syndrome I, unspecified: Secondary | ICD-10-CM | POA: Diagnosis not present

## 2021-03-23 DIAGNOSIS — M542 Cervicalgia: Secondary | ICD-10-CM | POA: Insufficient documentation

## 2021-03-23 DIAGNOSIS — M1712 Unilateral primary osteoarthritis, left knee: Secondary | ICD-10-CM

## 2021-03-23 DIAGNOSIS — M1711 Unilateral primary osteoarthritis, right knee: Secondary | ICD-10-CM

## 2021-03-23 DIAGNOSIS — M47816 Spondylosis without myelopathy or radiculopathy, lumbar region: Secondary | ICD-10-CM | POA: Insufficient documentation

## 2021-03-23 DIAGNOSIS — M546 Pain in thoracic spine: Secondary | ICD-10-CM | POA: Insufficient documentation

## 2021-03-23 DIAGNOSIS — Z5181 Encounter for therapeutic drug level monitoring: Secondary | ICD-10-CM

## 2021-03-23 MED ORDER — MORPHINE SULFATE ER 30 MG PO TBCR: 30.0000 mg | EXTENDED_RELEASE_TABLET | Freq: Two times a day (BID) | ORAL | 0 refills | Status: DC

## 2021-03-23 MED ORDER — MORPHINE SULFATE ER 60 MG PO TBCR: 60.0000 mg | EXTENDED_RELEASE_TABLET | Freq: Two times a day (BID) | ORAL | 0 refills | Status: DC

## 2021-03-23 MED ORDER — MORPHINE SULFATE 15 MG PO TABS: 15.0000 mg | ORAL_TABLET | Freq: Four times a day (QID) | ORAL | 0 refills | Status: DC | PRN

## 2021-03-23 NOTE — Telephone Encounter (Signed)
Patient states she did not get a print out prescription for the Morphine ER. Pharmacy does not have the prescriptions. Status in epic states print.

## 2021-03-23 NOTE — Progress Notes (Signed)
Subjective:    Patient ID: [name], female    DOB: 1960-02-21, 61 y.o.   MRN: [id number]  HPI: [name] is a 61 y.o. female who returns for follow up appointment for chronic pain and medication refill.She states her pain is located in her neck, mid- lower back pain  and right knee pain. She rates her pain 8. Her current exercise regime is walking and performing stretching exercises.  Ms. Morphine equivalent is 180.00 MME.   Last UDS was Performed on 01/24/2021, it was consistent.      Pain Inventory Average Pain 8 Pain Right Now 8 My pain is constant, sharp, burning, dull, stabbing, tingling, and aching  In the last 24 hours, has pain interfered with the following? General activity 10 Relation with others 10 Enjoyment of life 10 What TIME of day is your pain at its worst? morning , daytime, evening, night, and varies Sleep (in general) Poor  Pain is worse with: walking, bending, standing, and some activites Pain improves with: rest, heat/ice, therapy/exercise, medication, TENS, and injections Relief from Meds: 7  Family History  Problem Relation Age of Onset   Depression Mother    Hypertension Mother    Hypertension Father    Heart failure Father    Diabetes Father    Heart attack Father    Colon cancer Neg Hx    Esophageal cancer Neg Hx    Stomach cancer Neg Hx    Rectal cancer Neg Hx    Social History   Socioeconomic History   Marital status: Married    Spouse name: [name]   Number of children: 1   Years of education: some college   Highest education level: 12th grade  Occupational History   Occupation: On disability    Employer: UNEMPLOYED  Tobacco Use   Smoking status: Former    Packs/day: 0.50    Types: Cigarettes    Quit date: 02/06/2016    Years since quitting: 5.1   Smokeless tobacco: Never  Vaping Use   Vaping Use: Never used  Substance and Sexual Activity   Alcohol use: No    Alcohol/week: 0.0 standard drinks   Drug  use: No   Sexual activity: Not Currently    Partners: Male  Other Topics Concern   Not on file  Social History Narrative   On disability. Lives with her husband. She has a adopted daughter. She likes to read in her free time. She does chair exercises for 30-45 mins everyday.   Social Determinants of Health   Financial Resource Strain: Low Risk    Difficulty of Paying Living Expenses: Not hard at all  Food Insecurity: No Food Insecurity   Worried About [place] fund[place] in the Last Year: Never true   [place] in the Last Year: Never true  Transportation Needs: No Transportation Needs   Lack of Transportation (Medical): No   Lack of Transportation (Non-Medical): No  Physical Activity: Inactive   Days of Exercise per Week: 0 days   Minutes of Exercise per Session: 0 min  Stress: No Stress Concern Present   Feeling of Stress : Not at all  Social Connections: Socially Isolated   Frequency of Communication with Friends and Family: Never   Frequency of Social Gatherings with Friends and Family: Once a week   Attends Religious Services: Never   Marine scientist or Organizations: No   Attends Archivist Meetings: Never   Marital Status: Married  Past Surgical History:  Procedure Laterality Date   ABDOMINAL HYSTERECTOMY  May 2004   Basel Cell Carcinoma  06/2005, 07/2005   nasal tip and reconstruction   BREAST CYST ASPIRATION     CARPAL TUNNEL RELEASE  07/1993   both hands   COLONOSCOPY     HEMORROIDECTOMY  July 2003   KNEE ARTHROPLASTY  09/2001, 05/2003   left knee, chondromalasia   KNEE ARTHROSCOPY  jan 2005   Right knee, tisse release, chrondromalacia   REPLACEMENT TOTAL KNEE  Nov 2005   Left    THYROID LOBECTOMY  01/2005   malignant area removed from right lobe   THYROID SURGERY     thyroid lobe removal   TONSILLECTOMY  09/1972   TUBAL LIGATION  Sept 1999   Ulnar nerve entrapment  Feb 1995   left elbow   Past Surgical History:  Procedure  Laterality Date   ABDOMINAL HYSTERECTOMY  May 2004   Basel Cell Carcinoma  06/2005, 07/2005   nasal tip and reconstruction   BREAST CYST ASPIRATION     CARPAL TUNNEL RELEASE  07/1993   both hands   COLONOSCOPY     HEMORROIDECTOMY  July 2003   KNEE ARTHROPLASTY  09/2001, 05/2003   left knee, chondromalasia   KNEE ARTHROSCOPY  jan 2005   Right knee, tisse release, chrondromalacia   REPLACEMENT TOTAL KNEE  Nov 2005   Left    THYROID LOBECTOMY  01/2005   malignant area removed from right lobe   THYROID SURGERY     thyroid lobe removal   TONSILLECTOMY  09/1972   TUBAL LIGATION  Sept 1999   Ulnar nerve entrapment  Feb 1995   left elbow   Past Medical History:  Diagnosis Date   Allergy    Anxiety    Cancer ([place])    cancerous nodules on thyroid   Cancer ([place])    basal cell Cancer-nose   Chronic pain syndrome    COPD (chronic obstructive pulmonary disease) ([facility])    Degeneration of lumbar or lumbosacral intervertebral disc    Depression    Facet syndrome, lumbar    GERD (gastroesophageal reflux disease)    Hyperlipidemia    Intervertebral lumbar disc disorder with myelopathy, lumbar region    Lumbosacral spondylosis without myelopathy    Migraine without aura, with intractable migraine, so stated, without mention of status migrainosus    Primary localized osteoarthrosis, lower leg    Thyroid disease    hypothyroid   Unspecified musculoskeletal disorders and symptoms referable to neck    cervical/trapezius   BP 100/69    Pulse 83    Ht 5\' 8"  (1.727 m)    Wt 226 lb (102.5 kg)    SpO2 96%    BMI 34.36 kg/m   Opioid Risk Score:   Fall Risk Score:  `1  Depression screen PHQ 2/9  Depression screen [facility] 2/9 02/23/2021 01/24/2021 12/23/2020 12/23/2020 10/22/2020 10/01/2020 10/01/2020  Decreased Interest 1 1 0 0 1 1 2   Down, Depressed, Hopeless 1 1 3  0 1 1 2   PHQ - 2 Score 2 2 3  0 2 2 4   Altered sleeping - - - - - - -  Tired, decreased energy - - - - - - -  Change in appetite - - - -  - - -  Feeling bad or failure about yourself  - - - - - - -  Trouble concentrating - - - - - - -  Moving slowly or fidgety/restless - - - - - - -  Suicidal thoughts - - - - - - -  PHQ-9 Score - - - - - - -  Difficult doing work/chores - - - - - - -  Some recent data might be hidden    Review of Systems  Musculoskeletal:  Positive for arthralgias, back pain, gait problem and neck pain.  All other systems reviewed and are negative.     Objective:   Physical Exam Vitals and nursing note reviewed.  Constitutional:      Appearance: Normal appearance.  Neck:     Comments: Cervical Paraspinal Tenderness: C-5-C-6 Cardiovascular:     Rate and Rhythm: Normal rate and regular rhythm.     Pulses: Normal pulses.     Heart sounds: Normal heart sounds.  Pulmonary:     Effort: Pulmonary effort is normal.     Breath sounds: Normal breath sounds.  Musculoskeletal:     Cervical back: Normal range of motion and neck supple.     Comments: Normal Muscle Bulk and Muscle Testing Reveals:  Upper Extremities: Full ROM and Muscle Strength 5/5 Thoracic Paraspinal Tenderness:T-7-T-9  Lumbar Paraspinal Tenderness: L-4-L-5 Lower Extremities: Full ROM and Muscle Strength 5/5 Right Lower Extremity Flexion Produces Pain into her Right Patella Arises from Table Slowly using cane for support Narrow Based  Gait     Skin:    General: Skin is warm and dry.  Neurological:     Mental Status: She is alert and oriented to person, place, and time.  Psychiatric:        Mood and Affect: Mood normal.        Behavior: Behavior normal.         Assessment & Plan:  1.Chronic Bilateral Thoracic Back Pain/ Low Back Pain/ Lumbar Facet Arthropathy: Refilled MSIR 15 mg one tablet every 6 hours as needed #100 and Continue with Slow weaning of Morphine.   Refilled: MS Contin 60 mg and 30 mg Q 12 hours to equal   90 mg  #60.  Continue Gabapentin and Pamelor. 03/23/2021 We will continue the opioid monitoring program,  this consists of regular clinic visits, examinations, urine drug screen, pill counts as well as use of [place] Controlled Substance Reporting system. A 12 month History has been reviewed on the [place] on 03/23/2021. 2. Degenerative Disk Disease:Encouraged Continue HEP as Tolertated  and heat therapy. Continue Current Medication Regime.03/23/2020 3. Bilateral Greater Trochanteric Bursitis:  Continue current treatment regimen with Heat and Ice Therapy. 03/23/2021 4. Osteoarthritis of Bilateral Knees: R>L Continue current treatment modality with home exercise program and heat therapy. 03/23/2021 5. Migraine Headaches: Continue current medication regimen  Continue with  Migraine Journal. Aimovig discontinued due to Hives. Continue Lamictal and Relpax. Continue to Monitor.  03/23/2021 6. Smoking Cessation: She has quit smoking since 08/25/2016. Continue to Monitor. 03/23/2021 7.Constipation: Continue  Current medication regime with Linzess. 03/23/2021 8. Muscle Spasm: Continue current medication regime with Tizanidine.03/23/2021 9. DepressionAnxiety/ Panic Attacks: Continue current medication regimen and treatment modality.  Celexa.Lamictal  and  Counseling with [name] and  Dr. De Nurse Psychiatrist. 03/23/2021 10. RSD: Continue with current medication Regime with Gabapentin. 03/23/2021   F/U in 1 month

## 2021-03-23 NOTE — Telephone Encounter (Signed)
Pharmacy doesn't have her morphine ER, patient was told to call [facility].

## 2021-03-23 NOTE — Telephone Encounter (Signed)
Pharmacy was called.  Medication was e-scribed today.  Placed a call to Ms. Ayars, regarding the above. She verbalizes understanding.

## 2021-03-24 ENCOUNTER — Encounter: Payer: Self-pay | Admitting: Registered Nurse

## 2021-03-28 ENCOUNTER — Other Ambulatory Visit: Payer: Self-pay | Admitting: Family Medicine

## 2021-03-28 DIAGNOSIS — N3281 Overactive bladder: Secondary | ICD-10-CM

## 2021-04-05 ENCOUNTER — Ambulatory Visit (INDEPENDENT_AMBULATORY_CARE_PROVIDER_SITE_OTHER): Payer: Medicare Other | Admitting: Licensed Clinical Social Worker

## 2021-04-05 DIAGNOSIS — G894 Chronic pain syndrome: Secondary | ICD-10-CM | POA: Diagnosis not present

## 2021-04-05 DIAGNOSIS — M51369 Other intervertebral disc degeneration, lumbar region without mention of lumbar back pain or lower extremity pain: Secondary | ICD-10-CM

## 2021-04-05 DIAGNOSIS — M5136 Other intervertebral disc degeneration, lumbar region: Secondary | ICD-10-CM | POA: Diagnosis not present

## 2021-04-05 DIAGNOSIS — F331 Major depressive disorder, recurrent, moderate: Secondary | ICD-10-CM

## 2021-04-05 DIAGNOSIS — F411 Generalized anxiety disorder: Secondary | ICD-10-CM
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
## 2021-04-09 ENCOUNTER — Other Ambulatory Visit: Payer: Self-pay | Admitting: Family Medicine

## 2021-04-09 ENCOUNTER — Other Ambulatory Visit: Payer: Self-pay | Admitting: Registered Nurse

## 2021-04-11 NOTE — Telephone Encounter (Signed)
Please call pt and advise her that she is OVERDUE for labs (TSH) Non fasting. She also hasn't been seen since 08/2019.  ? ?[place]  ?

## 2021-04-11 NOTE — Telephone Encounter (Signed)
Placed a call to [name], she is taking the phenergan with she becomes nauseated with migraine. Promethazine ordered today, she verbalizes understanding.  ?

## 2021-04-11 NOTE — Telephone Encounter (Signed)
Patient has been scheduled for 04/26/21. AM ? ?

## 2021-04-20 ENCOUNTER — Encounter: Payer: Medicare Other | Attending: Physical Medicine and Rehabilitation | Admitting: Registered Nurse

## 2021-04-20 ENCOUNTER — Other Ambulatory Visit: Payer: Self-pay

## 2021-04-20 ENCOUNTER — Ambulatory Visit (INDEPENDENT_AMBULATORY_CARE_PROVIDER_SITE_OTHER): Payer: Medicare Other | Admitting: Licensed Clinical Social Worker

## 2021-04-20 ENCOUNTER — Encounter: Payer: Self-pay | Admitting: Registered Nurse

## 2021-04-20 VITALS — BP 105/68 | HR 108 | Ht 68.0 in | Wt 232.0 lb

## 2021-04-20 DIAGNOSIS — G8929 Other chronic pain: Secondary | ICD-10-CM | POA: Diagnosis not present

## 2021-04-20 DIAGNOSIS — M1712 Unilateral primary osteoarthritis, left knee: Secondary | ICD-10-CM | POA: Insufficient documentation

## 2021-04-20 DIAGNOSIS — M546 Pain in thoracic spine: Secondary | ICD-10-CM | POA: Insufficient documentation

## 2021-04-20 DIAGNOSIS — M51369 Other intervertebral disc degeneration, lumbar region without mention of lumbar back pain or lower extremity pain: Secondary | ICD-10-CM

## 2021-04-20 DIAGNOSIS — G894 Chronic pain syndrome: Secondary | ICD-10-CM

## 2021-04-20 DIAGNOSIS — R000 Tachycardia, unspecified: Secondary | ICD-10-CM | POA: Insufficient documentation

## 2021-04-20 DIAGNOSIS — G9050 Complex regional pain syndrome I, unspecified: Secondary | ICD-10-CM | POA: Diagnosis not present

## 2021-04-20 DIAGNOSIS — M1711 Unilateral primary osteoarthritis, right knee: Secondary | ICD-10-CM | POA: Diagnosis not present

## 2021-04-20 DIAGNOSIS — M5136 Other intervertebral disc degeneration, lumbar region: Secondary | ICD-10-CM | POA: Insufficient documentation

## 2021-04-20 DIAGNOSIS — F411 Generalized anxiety disorder: Secondary | ICD-10-CM

## 2021-04-20 DIAGNOSIS — Z79899 Other long term (current) drug therapy: Secondary | ICD-10-CM | POA: Diagnosis not present

## 2021-04-20 DIAGNOSIS — Z5181 Encounter for therapeutic drug level monitoring: Secondary | ICD-10-CM | POA: Insufficient documentation

## 2021-04-20 DIAGNOSIS — G43119 Migraine with aura, intractable, without status migrainosus: Secondary | ICD-10-CM | POA: Diagnosis not present

## 2021-04-20 DIAGNOSIS — M47816 Spondylosis without myelopathy or radiculopathy, lumbar region: Secondary | ICD-10-CM | POA: Diagnosis not present

## 2021-04-20 DIAGNOSIS — F331 Major depressive disorder, recurrent, moderate: Secondary | ICD-10-CM | POA: Diagnosis not present

## 2021-04-20 MED ORDER — GABAPENTIN 800 MG PO TABS: 800.0000 mg | ORAL_TABLET | Freq: Four times a day (QID) | ORAL | 5 refills | Status: DC

## 2021-04-20 MED ORDER — MORPHINE SULFATE 15 MG PO TABS: 15.0000 mg | ORAL_TABLET | Freq: Four times a day (QID) | ORAL | 0 refills | Status: DC | PRN

## 2021-04-20 MED ORDER — TIZANIDINE HCL 2 MG PO TABS: ORAL_TABLET | ORAL | 5 refills | Status: DC

## 2021-04-20 MED ORDER — MORPHINE SULFATE ER 60 MG PO TBCR: 60.0000 mg | EXTENDED_RELEASE_TABLET | Freq: Two times a day (BID) | ORAL | 0 refills | Status: DC

## 2021-04-20 MED ORDER — MORPHINE SULFATE ER 30 MG PO TBCR: 30.0000 mg | EXTENDED_RELEASE_TABLET | Freq: Two times a day (BID) | ORAL | 0 refills | Status: DC

## 2021-04-20 NOTE — Progress Notes (Signed)
Virtual Visit via Telephone Note ? ?I connected with [name] on 04/20/21 at  8:00 AM EDT by telephone and verified that I am speaking with the correct person using two identifiers. ? ?Location: ?Patient: home ?Provider: home office ? ?I discussed the limitations, risks, security and privacy concerns of performing an evaluation and management service by telephone and the availability of in person appointments. I also discussed with the patient that there may be a patient responsible charge related to this service. The patient expressed understanding and agreed to proceed. ?  ?I discussed the assessment and treatment plan with the patient. The patient was provided an opportunity to ask questions and all were answered. The patient agreed with the plan and demonstrated an understanding of the instructions. ?  ?The patient was advised to call back or seek an in-person evaluation if the symptoms worsen or if the condition fails to improve as anticipated. ? ?I provided 44 minutes of non-face-to-face time during this encounter. ? ?THERAPIST PROGRESS NOTE ? ?Session Time: 8:00 AM to 8:44 AM ? ?Participation Level: Active ? ?Behavioral Response: CasualAlertEuthymicinsession assess session helpful for mood ? ?Type of Therapy: Individual Therapy ? ?Treatment Goals addressed: Decrease in depression, anxiety, stress management, coping ? ?ProgressTowards Goals: Progressing-reviewed treatment plan patient noted limited progress toward goals but at the same time helpfulness of therapy to relieve stress, unload feelings, feedback from therapist ? ?Interventions: Solution Focused, Strength-based, Supportive, and Other: coping ? ?Summary: [name] is a 61 y.o. female who presents with review of treatment goals and she shares that sometimes helps to talk to another woman one of the reasons it helps to  talk to therapist. Why it helps to talk to  [place]. She is busy but at least email and talk at least twice a  month.  Transition to talking about supplements medications discussed side effects of medications and patient thinks reason for issues with memory, shared Topomax that caused her hair to come out so have to watch. Wants to talk to PCP for hair loss. It would give her ego boost one of the things she thought was pretty about herself was her hair. Very curly. Talked about books when read when young patient went to talking about making up rhymes to study, to singing and making up songs for her dog [name]. Adopted  a dog from next door who is always chained up.  Therapist enthusiastic and provided positive feedback for doing this for the dog. She is a Agricultural consultant. A big dog and asks [name] if thinks he can handle it and thinks he can. Saleena's dog Oden so loveable. [name] comforting. New dog called [name].     ? ?Therapist reviewed symptoms, facilitated expression of thoughts and feelings in this session able to focus on different things that make patient patient happy, are meaningful to her assess helpful session to focus on these positive things for mood.  We talked about for example her own dog Petrone who is a major support, adopting a new dog and hopeful will fit in with her family, connection with [place], and action with their dog Oden.  Patient able to share anecdotal stories that were humorous and urine.  Therapist provided support and space to patient as she shared her thoughts and feelings in session.  Reviewed treatment plan patient gave consent to complete virtually.  Noted limited progress but the same time very helpful to have the support of therapy helps her destress helps her to unload  her feelings.  Noted patient's own insight helpful to talk to another female for insights and feedback.  Therapist assesses helpful for patient to talk about meaningful things and she feels good and in session.  Therapist provided active listening open questions supportive interventions. ?Suicidal/Homicidal:  No ? ?Plan: Return again in 2 weeks.2.Therapist work with patient on coping strategies for depression, supportive interventions, talk about significant stressors to relieve anxiety  ? ?Diagnosis: major depressive disorder, recurrent, moderate, degenerative disc disease, chronic pain syndrome, generalized anxiety disorder ? ?Collaboration of Care: Other none neeed ? ?Patient/Guardian was advised Release of Information must be obtained prior to any record release in order to collaborate their care with an outside provider. Patient/Guardian was advised if they have not already done so to contact the registration department to sign all necessary forms in order for [place] to release information regarding their care.  ? ?Consent: Patient/Guardian gives verbal consent for treatment and assignment of benefits for services provided during this visit. Patient/Guardian expressed understanding and agreed to proceed.  ? ?[name], LCSW ?04/20/2021 ? ?

## 2021-04-20 NOTE — Progress Notes (Signed)
? ?Subjective:  ? ? Patient ID: [name], female    DOB: 1961/01/30, 61 y.o.   MRN: [id number] ? ?HPI: [name] is a 61 y.o. female who returns for follow up appointment for chronic pain and medication refill. She states her pain is located in her mid- back mainly left side, lower back pain and bilateral knee pain. [name] reports increase intensity of bilateral knee pain, she will ask her PCP or find a orthopedic office closer to her home, she will send a My-Chart message with a update and referral will be sent, she verbalizes understanding. She  rates her pain 7.Her  current exercise regime is walking and performing stretching exercises. ? ?[name] Morphine equivalent is 181.07 MME.   Last UDS was Performed on 01/24/2021, it was consistent.  ?  ? ?Pain Inventory ?Average Pain 8 ?Pain Right Now 7 ?My pain is constant, sharp, burning, dull, stabbing, tingling, and aching ? ?In the last 24 hours, has pain interfered with the following? ?General activity 10 ?Relation with others 10 ?Enjoyment of life 10 ?What TIME of day is your pain at its worst? morning , daytime, evening, and night ?Sleep (in general) Fair ? ?Pain is worse with: walking, bending, standing, and some activites ?Pain improves with: rest, heat/ice, therapy/exercise, medication, TENS, and injections ?Relief from Meds: 5 ? ?Family History  ?Problem Relation Age of Onset  ? Depression Mother   ? Hypertension Mother   ? Hypertension Father   ? Heart failure Father   ? Diabetes Father   ? Heart attack Father   ? Colon cancer Neg Hx   ? Esophageal cancer Neg Hx   ? Stomach cancer Neg Hx   ? Rectal cancer Neg Hx   ? ?Social History  ? ?Socioeconomic History  ? Marital status: Married  ?  Spouse name: [name]  ? Number of children: 1  ? Years of education: some college  ? Highest education level: 12th grade  ?Occupational History  ? Occupation: On disability  ?  Employer: UNEMPLOYED  ?Tobacco Use  ? Smoking status: Former  ?   Packs/day: 0.50  ?  Types: Cigarettes  ?  Quit date: 02/06/2016  ?  Years since quitting: 5.2  ? Smokeless tobacco: Never  ?Vaping Use  ? Vaping Use: Never used  ?Substance and Sexual Activity  ? Alcohol use: No  ?  Alcohol/week: 0.0 standard drinks  ? Drug use: No  ? Sexual activity: Not Currently  ?  Partners: Male  ?Other Topics Concern  ? Not on file  ?Social History Narrative  ? On disability. Lives with her husband. She has a adopted daughter. She likes to read in her free time. She does chair exercises for 30-45 mins everyday.  ? ?Social Determinants of Health  ? ?Financial Resource Strain: Low Risk   ? Difficulty of Paying Living Expenses: Not hard at all  ?Food Insecurity: No Food Insecurity  ? Worried About Charity fundraiser in the Last Year: Never true  ? Ran Out of Food in the Last Year: Never true  ?Transportation Needs: No Transportation Needs  ? Lack of Transportation (Medical): No  ? Lack of Transportation (Non-Medical): No  ?Physical Activity: Inactive  ? Days of Exercise per Week: 0 days  ? Minutes of Exercise per Session: 0 min  ?Stress: No Stress Concern Present  ? Feeling of Stress : Not at all  ?Social Connections: Socially Isolated  ? Frequency of Communication with Friends and  Family: Never  ? Frequency of Social Gatherings with Friends and Family: Once a week  ? Attends Religious Services: Never  ? Active Member of Clubs or Organizations: No  ? Attends Archivist Meetings: Never  ? Marital Status: Married  ? ?Past Surgical History:  ?Procedure Laterality Date  ? ABDOMINAL HYSTERECTOMY  May 2004  ? Basel Cell Carcinoma  06/2005, 07/2005  ? nasal tip and reconstruction  ? BREAST CYST ASPIRATION    ? CARPAL TUNNEL RELEASE  07/1993  ? both hands  ? COLONOSCOPY    ? HEMORROIDECTOMY  July 2003  ? KNEE ARTHROPLASTY  09/2001, 05/2003  ? left knee, chondromalasia  ? KNEE ARTHROSCOPY  jan 2005  ? Right knee, tisse release, chrondromalacia  ? REPLACEMENT TOTAL KNEE  Nov 2005  ? Left   ? THYROID  LOBECTOMY  01/2005  ? malignant area removed from right lobe  ? THYROID SURGERY    ? thyroid lobe removal  ? TONSILLECTOMY  09/1972  ? TUBAL LIGATION  Sept 1999  ? Ulnar nerve entrapment  Feb 1995  ? left elbow  ? ?Past Surgical History:  ?Procedure Laterality Date  ? ABDOMINAL HYSTERECTOMY  May 2004  ? Basel Cell Carcinoma  06/2005, 07/2005  ? nasal tip and reconstruction  ? BREAST CYST ASPIRATION    ? CARPAL TUNNEL RELEASE  07/1993  ? both hands  ? COLONOSCOPY    ? HEMORROIDECTOMY  July 2003  ? KNEE ARTHROPLASTY  09/2001, 05/2003  ? left knee, chondromalasia  ? KNEE ARTHROSCOPY  jan 2005  ? Right knee, tisse release, chrondromalacia  ? REPLACEMENT TOTAL KNEE  Nov 2005  ? Left   ? THYROID LOBECTOMY  01/2005  ? malignant area removed from right lobe  ? THYROID SURGERY    ? thyroid lobe removal  ? TONSILLECTOMY  09/1972  ? TUBAL LIGATION  Sept 1999  ? Ulnar nerve entrapment  Feb 1995  ? left elbow  ? ?Past Medical History:  ?Diagnosis Date  ? Allergy   ? Anxiety   ? Cancer [facility])   ? cancerous nodules on thyroid  ? Cancer [facility])   ? basal cell Cancer-nose  ? Chronic pain syndrome   ? COPD (chronic obstructive pulmonary disease) ([place])   ? Degeneration of lumbar or lumbosacral intervertebral disc   ? Depression   ? Facet syndrome, lumbar   ? GERD (gastroesophageal reflux disease)   ? Hyperlipidemia   ? Intervertebral lumbar disc disorder with myelopathy, lumbar region   ? Lumbosacral spondylosis without myelopathy   ? Migraine without aura, with intractable migraine, so stated, without mention of status migrainosus   ? Primary localized osteoarthrosis, lower leg   ? Thyroid disease   ? hypothyroid  ? Unspecified musculoskeletal disorders and symptoms referable to neck   ? cervical/trapezius  ? ?BP 105/68   Pulse (!) 110   Ht '5\' 8"'$  (1.727 m)   Wt 232 lb (105.2 kg)   SpO2 93%   BMI 35.28 kg/m?  ? ?Opioid Risk Score:   ?Fall Risk Score:  `1 ? ?Depression screen PHQ 2/9 ? ?Depression screen [facility] 2/9 03/23/2021 02/23/2021  01/24/2021 12/23/2020 12/23/2020 10/22/2020 10/01/2020  ?Decreased Interest '1 1 1 '$ 0 0 1 1  ?Down, Depressed, Hopeless '1 1 1 3 '$ 0 1 1  ?PHQ - 2 Score '2 2 2 3 '$ 0 2 2  ?Altered sleeping - - - - - - -  ?Tired, decreased energy - - - - - - -  ?  Change in appetite - - - - - - -  ?Feeling bad or failure about yourself  - - - - - - -  ?Trouble concentrating - - - - - - -  ?Moving slowly or fidgety/restless - - - - - - -  ?Suicidal thoughts - - - - - - -  ?PHQ-9 Score - - - - - - -  ?Difficult doing work/chores - - - - - - -  ?Some recent data might be hidden  ?  ? ?Review of Systems  ?Constitutional: Negative.   ?HENT: Negative.    ?Eyes: Negative.   ?Respiratory: Negative.    ?Cardiovascular: Negative.   ?Gastrointestinal: Negative.   ?Endocrine: Negative.   ?Genitourinary: Negative.   ?Musculoskeletal:  Positive for back pain and gait problem.  ?Skin: Negative.   ?Allergic/Immunologic: Negative.   ?Hematological: Negative.   ?Psychiatric/Behavioral: Negative.    ? ?   ?Objective:  ? Physical Exam ?Vitals and nursing note reviewed.  ?Constitutional:   ?   Appearance: Normal appearance.  ?Neck:  ?   Comments: Cervical Paraspinal Tenderness: C-5-C-6 ? ?Cardiovascular:  ?   Rate and Rhythm: Normal rate and regular rhythm.  ?   Pulses: Normal pulses.  ?   Heart sounds: Normal heart sounds.  ?Pulmonary:  ?   Effort: Pulmonary effort is normal.  ?   Breath sounds: Normal breath sounds.  ?Musculoskeletal:  ?   Cervical back: Normal range of motion and neck supple.  ?   Comments: Normal Muscle Bulk and Muscle Testing Reveals:  ?Upper Extremities: Full ROM and Muscle Strength 5/5 ?Thoracic Paraspinal Tenderness: T-7-T-9 Mainly Left Side ?Lumbar Paraspinal Tenderness: L-4-L-5 ?Bilateral Greater Trochanter Tenderness ?Lower Extremities: Decreased ROM and Muscle Strength 4/5 ?Bilateral Lower Extremity Flexion Produces Pain into her Bilateral Patella's ?Arises from Table slowly  ?Antalgic  Gait  ?   ?Skin: ?   General: Skin is warm and dry.   ?Neurological:  ?   Mental Status: She is alert and oriented to person, place, and time.  ?Psychiatric:     ?   Mood and Affect: Mood normal.     ?   Behavior: Behavior normal.  ? ? ? ? ?   ?Assessment & Plan:  ?

## 2021-04-26 ENCOUNTER — Ambulatory Visit: Payer: Medicare Other | Admitting: Family Medicine

## 2021-04-26 NOTE — Progress Notes (Deleted)
? ?Established Patient Office Visit ? ?Subjective:  ?Patient ID: [name], female    DOB: 05/25/1960  Age: 61 y.o. MRN: [id number] ? ?CC: No chief complaint on file. ? ? ?HPI ?[name] presents for  ? ?Impaired fasting glucose-no increased thirst or urination. No symptoms consistent with hypoglycemia. ? ?Hypothyroidism - Taking medication regularly in the AM away from food and vitamins, etc. No recent change to skin, hair, or energy levels. ? ? ? ?Past Medical History:  ?Diagnosis Date  ? Allergy   ? Anxiety   ? Cancer [facility])   ? cancerous nodules on thyroid  ? Cancer [facility]   ? basal cell Cancer-nose  ? Chronic pain syndrome   ? COPD (chronic obstructive pulmonary disease) ([place])   ? Degeneration of lumbar or lumbosacral intervertebral disc   ? Depression   ? Facet syndrome, lumbar   ? GERD (gastroesophageal reflux disease)   ? Hyperlipidemia   ? Intervertebral lumbar disc disorder with myelopathy, lumbar region   ? Lumbosacral spondylosis without myelopathy   ? Migraine without aura, with intractable migraine, so stated, without mention of status migrainosus   ? Primary localized osteoarthrosis, lower leg   ? Thyroid disease   ? hypothyroid  ? Unspecified musculoskeletal disorders and symptoms referable to neck   ? cervical/trapezius  ? ? ?Past Surgical History:  ?Procedure Laterality Date  ? ABDOMINAL HYSTERECTOMY  May 2004  ? Basel Cell Carcinoma  06/2005, 07/2005  ? nasal tip and reconstruction  ? BREAST CYST ASPIRATION    ? CARPAL TUNNEL RELEASE  07/1993  ? both hands  ? COLONOSCOPY    ? HEMORROIDECTOMY  July 2003  ? KNEE ARTHROPLASTY  09/2001, 05/2003  ? left knee, chondromalasia  ? KNEE ARTHROSCOPY  jan 2005  ? Right knee, tisse release, chrondromalacia  ? REPLACEMENT TOTAL KNEE  Nov 2005  ? Left   ? THYROID LOBECTOMY  01/2005  ? malignant area removed from right lobe  ? THYROID SURGERY    ? thyroid lobe removal  ? TONSILLECTOMY  09/1972  ? TUBAL LIGATION  Sept 1999  ? Ulnar nerve entrapment   Feb 1995  ? left elbow  ? ? ?Family History  ?Problem Relation Age of Onset  ? Depression Mother   ? Hypertension Mother   ? Hypertension Father   ? Heart failure Father   ? Diabetes Father   ? Heart attack Father   ? Colon cancer Neg Hx   ? Esophageal cancer Neg Hx   ? Stomach cancer Neg Hx   ? Rectal cancer Neg Hx   ? ? ?Social History  ? ?Socioeconomic History  ? Marital status: Married  ?  Spouse name: [name]  ? Number of children: 1  ? Years of education: some college  ? Highest education level: 12th grade  ?Occupational History  ? Occupation: On disability  ?  Employer: UNEMPLOYED  ?Tobacco Use  ? Smoking status: Former  ?  Packs/day: 0.50  ?  Types: Cigarettes  ?  Quit date: 02/06/2016  ?  Years since quitting: 5.2  ? Smokeless tobacco: Never  ?Vaping Use  ? Vaping Use: Never used  ?Substance and Sexual Activity  ? Alcohol use: No  ?  Alcohol/week: 0.0 standard drinks  ? Drug use: No  ? Sexual activity: Not Currently  ?  Partners: Male  ?Other Topics Concern  ? Not on file  ?Social History Narrative  ? On disability. Lives with her husband. She has a  adopted daughter. She likes to read in her free time. She does chair exercises for 30-45 mins everyday.  ? ?Social Determinants of Health  ? ?Financial Resource Strain: Low Risk   ? Difficulty of Paying Living Expenses: Not hard at all  ?Food Insecurity: No Food Insecurity  ? Worried About Charity fundraiser in the Last Year: Never true  ? Ran Out of Food in the Last Year: Never true  ?Transportation Needs: No Transportation Needs  ? Lack of Transportation (Medical): No  ? Lack of Transportation (Non-Medical): No  ?Physical Activity: Inactive  ? Days of Exercise per Week: 0 days  ? Minutes of Exercise per Session: 0 min  ?Stress: No Stress Concern Present  ? Feeling of Stress : Not at all  ?Social Connections: Socially Isolated  ? Frequency of Communication with Friends and Family: Never  ? Frequency of Social Gatherings with Friends and Family: Once a week  ?  Attends Religious Services: Never  ? Active Member of Clubs or Organizations: No  ? Attends Archivist Meetings: Never  ? Marital Status: Married  ?Intimate Partner Violence: Not At Risk  ? Fear of Current or Ex-Partner: No  ? Emotionally Abused: No  ? Physically Abused: No  ? Sexually Abused: No  ? ? ?Outpatient Medications Prior to Visit  ?Medication Sig Dispense Refill  ? AMBULATORY NON FORMULARY MEDICATION Medication Name: Patient can safely propel the wheelchair in the home or has a caregiver who can provide assistance. Length of need Lifetime. ?OZ:DGUYQIH suffers from reflex sympathetic dystrophy, DDD, lumbar facet arthropathy, and degenerative arthritis of right knee 1 each 0  ? Blood Glucose Monitoring Suppl (RELION CONFIRM GLUCOSE MONITOR) w/Device KIT by Does not apply route.    ? citalopram (CELEXA) 40 MG tablet TAKE 1 TABLET(40 MG) BY MOUTH AT BEDTIME 30 tablet 5  ? eletriptan (RELPAX) 20 MG tablet Take 1 tablet (20 mg total) by mouth as needed for migraine or headache. May repeat in 2 hours if headache persists or recurs. 20 tablet 2  ? gabapentin (NEURONTIN) 800 MG tablet Take 1 tablet (800 mg total) by mouth 4 (four) times daily. 120 tablet 5  ? lamoTRIgine (LAMICTAL) 25 MG tablet Take 1 tablet (25 mg total) by mouth 2 (two) times daily. 60 tablet 5  ? levothyroxine (SYNTHROID) 25 MCG tablet Take 1 tablet (25 mcg total) by mouth daily before breakfast. 30 day supply given. MUST HAVE LABS DONE FOR ANY FUTURE REFILLS. 30 tablet 0  ? LINZESS 145 MCG CAPS capsule TAKE 1 CAPSULE(145 MCG) BY MOUTH DAILY 30 capsule 3  ? morphine (MS CONTIN) 30 MG 12 hr tablet Take 1 tablet (30 mg total) by mouth every 12 (twelve) hours. 60 tablet 0  ? morphine (MS CONTIN) 60 MG 12 hr tablet Take 1 tablet (60 mg total) by mouth every 12 (twelve) hours. 60 tablet 0  ? morphine (MSIR) 15 MG tablet Take 1 tablet (15 mg total) by mouth every 6 (six) hours as needed for severe pain. 100 tablet 0  ? nortriptyline  (PAMELOR) 50 MG capsule TAKE 2 CAPSULES(100 MG) BY MOUTH AT BEDTIME 180 capsule 3  ? promethazine (PHENERGAN) 12.5 MG tablet TAKE 1 TABLET(12.5 MG) BY MOUTH EVERY 12 HOURS 30 tablet 2  ? tiZANidine (ZANAFLEX) 2 MG tablet TAKE 1 TABLET(2 MG) BY MOUTH THREE TIMES DAILY AS NEEDED FOR MUSCLE SPASMS 90 tablet 5  ? tolterodine (DETROL LA) 4 MG 24 hr capsule Take 1 capsule (4 mg total)  by mouth daily. Please call the office to schedule an appointment and labs. 30 capsule 0  ? ?No facility-administered medications prior to visit.  ? ? ?Allergies  ?Allergen Reactions  ? Aspirin Other (See Comments)  ?  Abdominal bleeding ?  ? Ibuprofen Swelling  ? Imitrex [Sumatriptan Base] Other (See Comments)  ?  Drops BP ?  ? Norvasc [Amlodipine Besylate] Swelling  ? Nsaids Swelling  ? Tape   ?  Skin blisters under surgical tape  ? Topamax [Topiramate] Other (See Comments)  ?  Hair loss  ? ? ?ROS ?Review of Systems ? ?  ?Objective:  ?  ?Physical Exam ? ?There were no vitals taken for this visit. ?Wt Readings from Last 3 Encounters:  ?04/20/21 232 lb (105.2 kg)  ?03/23/21 226 lb (102.5 kg)  ?02/23/21 227 lb (103 kg)  ? ? ? ?Health Maintenance Due  ?Topic Date Due  ? COVID-19 Vaccine (1) Never done  ? URINE MICROALBUMIN  Never done  ? Zoster Vaccines- Shingrix (1 of 2) Never done  ? INFLUENZA VACCINE  09/06/2020  ? COLONOSCOPY (Pts 45-33yr Insurance coverage will need to be confirmed)  04/25/2021  ? ? ?There are no preventive care reminders to display for this patient. ? ?Lab Results  ?Component Value Date  ? TSH 4.59 (H) 10/11/2017  ? ?Lab Results  ?Component Value Date  ? WBC 7.4 05/26/2019  ? HGB 13.4 05/26/2019  ? HCT 40.8 05/26/2019  ? MCV 91.1 05/26/2019  ? PLT 258 05/26/2019  ? ?Lab Results  ?Component Value Date  ? NA 141 07/25/2019  ? K 4.4 07/25/2019  ? CO2 28 07/25/2019  ? GLUCOSE 136 (H) 07/25/2019  ? BUN 13 07/25/2019  ? CREATININE 0.61 07/25/2019  ? BILITOT 1.0 05/26/2019  ? ALKPHOS 105 05/26/2015  ? AST 34 05/26/2019  ? ALT  25 05/26/2019  ? PROT 6.2 05/26/2019  ? ALBUMIN 4.0 05/26/2015  ? CALCIUM 9.1 07/25/2019  ? ?Lab Results  ?Component Value Date  ? CHOL 131 10/11/2017  ? ?Lab Results  ?Component Value Date  ? HDL 47 (L) 09/05

## 2021-05-01 ENCOUNTER — Other Ambulatory Visit: Payer: Self-pay | Admitting: Registered Nurse

## 2021-05-04 ENCOUNTER — Other Ambulatory Visit: Payer: Self-pay | Admitting: Family Medicine

## 2021-05-04 ENCOUNTER — Ambulatory Visit (INDEPENDENT_AMBULATORY_CARE_PROVIDER_SITE_OTHER): Payer: Medicare Other | Admitting: Licensed Clinical Social Worker

## 2021-05-04 DIAGNOSIS — F331 Major depressive disorder, recurrent, moderate: Secondary | ICD-10-CM | POA: Diagnosis not present

## 2021-05-04 DIAGNOSIS — G894 Chronic pain syndrome: Secondary | ICD-10-CM

## 2021-05-04 DIAGNOSIS — N3281 Overactive bladder: Secondary | ICD-10-CM

## 2021-05-04 DIAGNOSIS — F411 Generalized anxiety disorder: Secondary | ICD-10-CM

## 2021-05-04 DIAGNOSIS — M5136 Other intervertebral disc degeneration, lumbar region: Secondary | ICD-10-CM

## 2021-05-04 NOTE — Progress Notes (Signed)
?Virtual Visit via Telephone Note ? ?I connected with [name] on 05/04/21 at  9:00 AM EDT by telephone and verified that I am speaking with the correct person using two identifiers. ? ?Location: ?Patient: home ?Provider: home office ?  ?I discussed the limitations, risks, security and privacy concerns of performing an evaluation and management service by telephone and the availability of in person appointments. I also discussed with the patient that there may be a patient responsible charge related to this service. The patient expressed understanding and agreed to proceed. ?  ?I discussed the assessment and treatment plan with the patient. The patient was provided an opportunity to ask questions and all were answered. The patient agreed with the plan and demonstrated an understanding of the instructions. ?  ?The patient was advised to call back or seek an in-person evaluation if the symptoms worsen or if the condition fails to improve as anticipated. ? ?I provided  minutes of non-face-to-face time during this encounter. ? ?THERAPIST PROGRESS NOTE ? ?Session Time: 8:00 AM ? ?Participation Level: Active ? ?Behavioral Response: CasualAlertEuthymic ? ?Type of Therapy: Individual Therapy ? ?Treatment Goals addressed: Decrease in depression, anxiety, stress management, coping ? ?ProgressTowards Goals: Progressing-patient benefits from therapy sessions helping her with mood symptoms through processing her feelings, discussing positive activities, some of her own good qualities that help with mood ? ?Interventions: CBT, Solution Focused, Supportive, and Other: Coping ? ?Summary: [name] is a 61 y.o. female who presents with didn't sleep much last night sore throat almost has laryngitis from it. Post nasal drip from allergies. Not much going on not much happening. Had a birthday [birth year]. Didn't do much he bought her a pie. Took both of her sisters and [name]'s son Marchia Bond and brother Mortimer Fries took all their  birthdays (shares ate a lot of cake with all those birthdays. Took them to  [employer]. Took them all there.  They enjoyed it. Talked about some of things she enjoys. Reading and every time she reads again she picks up details don't pick up first time you read it. Can't get enough of the Lord of the Rings. Reads every year and watches the movies. Transitioned to talking about birthdays no siblings called patient is the only one who calls though Marchia Bond emailed her and sent a card and Jannette Spanner called her.  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 ?Therapist utilized CBT and solution focused brief therapy to address mood and stress issues.  Therapist provided support and space for patient to share her thoughts and feelings in sessions.  Patient responds well to interventions as it is valuable for her to have a place to share her feelings to process through to get supportive interventions as well as strength-based.  We were able to talk about some of her interest things she enjoys about her hobbies and think this help with mood.  Also talking about her birthday and being kind to her brothers and sisters was also helpful for mood.  Therapist provided active listening open questions ? ?Suicidal/Homicidal: No ? ?Plan: Return again in 2 weeks.2.Therapist work with patient on coping strategies for depression, supportive interventions, talk about significant stressors to relieve anxiet ? ?Diagnosis: major depressive disorder, recurrent, moderate, degenerative disc disease, chronic pain syndrome, generalized anxiety disorder ? ?Collaboration of Care: Other none needed ? ?Patient/Guardian was advised Release of Information must  be obtained prior to any record release in order to collaborate their care with an outside provider. Patient/Guardian was advised if they have not already done so to contact the  registration department to sign all necessary forms in order for [place] to release information regarding their care.  ? ?Consent: Patient/Guardian gives verbal consent for treatment and assignment of benefits for services provided during this visit. Patient/Guardian expressed understanding and agreed to proceed.  ? ?[name], LCSW ?05/04/2021 ? ?

## 2021-05-12 ENCOUNTER — Other Ambulatory Visit: Payer: Self-pay | Admitting: Family Medicine

## 2021-05-16 ENCOUNTER — Ambulatory Visit (INDEPENDENT_AMBULATORY_CARE_PROVIDER_SITE_OTHER): Payer: Medicare Other | Admitting: Licensed Clinical Social Worker

## 2021-05-16 DIAGNOSIS — M5136 Other intervertebral disc degeneration, lumbar region: Secondary | ICD-10-CM

## 2021-05-16 DIAGNOSIS — F331 Major depressive disorder, recurrent, moderate: Secondary | ICD-10-CM | POA: Diagnosis not present

## 2021-05-16 DIAGNOSIS — G894 Chronic pain syndrome: Secondary | ICD-10-CM

## 2021-05-16 DIAGNOSIS — F411 Generalized anxiety disorder: Secondary | ICD-10-CM | POA: Diagnosis not present

## 2021-05-16 NOTE — Progress Notes (Signed)
?Virtual Visit via Telephone Note ? ?I connected with [name] on 05/16/21 at  8:00 AM EDT by telephone and verified that I am speaking with the correct person using two identifiers. ? ?Location: ?Patient: home ?Provider: office ?  ?I discussed the limitations, risks, security and privacy concerns of performing an evaluation and management service by telephone and the availability of in person appointments. I also discussed with the patient that there may be a patient responsible charge related to this service. The patient expressed understanding and agreed to proceed. ? ?I discussed the assessment and treatment plan with the patient. The patient was provided an opportunity to ask questions and all were answered. The patient agreed with the plan and demonstrated an understanding of the instructions. ?  ?The patient was advised to call back or seek an in-person evaluation if the symptoms worsen or if the condition fails to improve as anticipated. ? ?I provided 53 minutes of non-face-to-face time during this encounter. ? ?THERAPIST PROGRESS NOTE ? ?Session Time: 8:00 AM to 8:53 AM ? ?Participation Level: Active ? ?Behavioral Response: CasualAlertappropriate ? ?Type of Therapy: Individual Therapy ? ?Treatment Goals addressed: Decrease in depression, anxiety, stress management, coping ? ?ProgressTowards Goals: Progressing-patient utilizing session to process feelings actively work on treatment goals of anxiety and depression, stress management ? ?Interventions: Solution Focused, Strength-based, Supportive, and Other: coping ? ?Summary: [name] is a 61 y.o. female who presents with had a good weekend. Patient drove to [facility] to see the kids. Took them out for [place]. Knees size of basketballs laying in bed feet up trying to get swelling down. Sometimes whole legs from ankles all the way up are swollen. Now have two dogs [name] and [name]. Talked about the dogs recently adopted  [name]. Comforting when [name] lays by her. Also knows when hurting and he tries to comfort patient. Talked about the trip and patient says she likes to get away and the drive. Therapist pointed out that breaking out of the routine is good for mental health. Talked about up and down all night. Tired all the time and don't get rest when you don't sleep, a good sleep, doesn't dream. Patient describes it being tough to not get rest. Don't get rest then upside down and stay up all night. She calls it napping taking one nap one after the other. Pug is perfect breed for that type of life. Chills out with her can play with him on the bed.    ? ?Therapist reviewed symptoms, facilitated expression of thoughts and feelings.  Therapist provided support and space for patient as she shared her thoughts and feelings in session.  Noted positive coping patient taking a trip does not do that often and encouraged her because it helps with mood and is good for her.  Talked about her trip, talked about her dogs 1 who is new and becoming comfortable with the family.  Noted Petron helpful for her coping providing comfort and also connecting with her when she does not feel well.  Assess helpful for patient to share these narratives with therapist connecting with support helpful for her mental health.  Therapist provided active listening open questions supportive interventions.  Also processed stressors in session helpful for patient for coping. ?Suicidal/Homicidal: No ? ?Plan: Return again in  weeks. ? ?Diagnosis:  major depressive disorder, recurrent, moderate, degenerative disc disease, chronic pain syndrome, generalized anxiety disorder ? ?Collaboration of Care: Other none needed ? ?Patient/Guardian was advised Release of Information must  be obtained prior to any record release in order to collaborate their care with an outside provider. Patient/Guardian was advised if they have not already done so to contact the registration  department to sign all necessary forms in order for [place] to release information regarding their care.  ? ?Consent: Patient/Guardian gives verbal consent for treatment and assignment of benefits for services provided during this visit. Patient/Guardian expressed understanding and agreed to proceed.  ? ?[name], LCSW ?05/16/2021 ? ?

## 2021-05-18 ENCOUNTER — Encounter: Payer: Medicare Other | Attending: Physical Medicine and Rehabilitation | Admitting: Registered Nurse

## 2021-05-18 VITALS — BP 91/63 | HR 79 | Ht 68.0 in | Wt 226.0 lb

## 2021-05-18 DIAGNOSIS — G8929 Other chronic pain: Secondary | ICD-10-CM | POA: Insufficient documentation

## 2021-05-18 DIAGNOSIS — G43119 Migraine with aura, intractable, without status migrainosus: Secondary | ICD-10-CM | POA: Diagnosis not present

## 2021-05-18 DIAGNOSIS — M47816 Spondylosis without myelopathy or radiculopathy, lumbar region: Secondary | ICD-10-CM | POA: Diagnosis not present

## 2021-05-18 DIAGNOSIS — Z5181 Encounter for therapeutic drug level monitoring: Secondary | ICD-10-CM | POA: Insufficient documentation

## 2021-05-18 DIAGNOSIS — M546 Pain in thoracic spine: Secondary | ICD-10-CM | POA: Insufficient documentation

## 2021-05-18 DIAGNOSIS — M1711 Unilateral primary osteoarthritis, right knee: Secondary | ICD-10-CM | POA: Diagnosis not present

## 2021-05-18 DIAGNOSIS — M7061 Trochanteric bursitis, right hip: Secondary | ICD-10-CM | POA: Diagnosis not present

## 2021-05-18 DIAGNOSIS — Z79899 Other long term (current) drug therapy: Secondary | ICD-10-CM | POA: Insufficient documentation

## 2021-05-18 DIAGNOSIS — G9050 Complex regional pain syndrome I, unspecified: Secondary | ICD-10-CM | POA: Insufficient documentation

## 2021-05-18 DIAGNOSIS — G894 Chronic pain syndrome: Secondary | ICD-10-CM | POA: Diagnosis not present

## 2021-05-18 DIAGNOSIS — F329 Major depressive disorder, single episode, unspecified: Secondary | ICD-10-CM | POA: Diagnosis present

## 2021-05-18 DIAGNOSIS — M5136 Other intervertebral disc degeneration, lumbar region: Secondary | ICD-10-CM | POA: Insufficient documentation

## 2021-05-18 DIAGNOSIS — M1712 Unilateral primary osteoarthritis, left knee: Secondary | ICD-10-CM | POA: Insufficient documentation

## 2021-05-18 MED ORDER — MORPHINE SULFATE ER 30 MG PO TBCR: 30.0000 mg | EXTENDED_RELEASE_TABLET | Freq: Two times a day (BID) | ORAL | 0 refills | Status: DC

## 2021-05-18 MED ORDER — MORPHINE SULFATE 15 MG PO TABS: 15.0000 mg | ORAL_TABLET | Freq: Four times a day (QID) | ORAL | 0 refills | Status: DC | PRN

## 2021-05-18 MED ORDER — MORPHINE SULFATE ER 60 MG PO TBCR: 60.0000 mg | EXTENDED_RELEASE_TABLET | Freq: Two times a day (BID) | ORAL | 0 refills | Status: DC

## 2021-05-18 NOTE — Progress Notes (Signed)
? ?Subjective:  ? ? Patient ID: [name], female    DOB: 1961/01/30, 61 y.o.   MRN: [id number] ? ?HPI: [name] is a 61 y.o. female who returns for follow up appointment for chronic pain and medication refill. She states her pain is located in her mid- lower back, right hip and bilateral knee pain R>L. She rates her pain 8. Her current exercise regime is walking and performing stretching exercises. ? ?[name] Morphine equivalent is  246.07MME.   UDS ordered today.  ?  ? ?Pain Inventory ?Average Pain 9 ?Pain Right Now 8 ?My pain is sharp, burning, dull, stabbing, tingling, and aching ? ?In the last 24 hours, has pain interfered with the following? ?General activity 10 ?Relation with others 10 ?Enjoyment of life 10 ?What TIME of day is your pain at its worst? varies ?Sleep (in general) Poor ? ?Pain is worse with: walking, bending, standing, and some activites ?Pain improves with: rest, heat/ice, therapy/exercise, medication, TENS, and injections ?Relief from Meds: 6 ? ?Family History  ?Problem Relation Age of Onset  ? Depression Mother   ? Hypertension Mother   ? Hypertension Father   ? Heart failure Father   ? Diabetes Father   ? Heart attack Father   ? Colon cancer Neg Hx   ? Esophageal cancer Neg Hx   ? Stomach cancer Neg Hx   ? Rectal cancer Neg Hx   ? ?Social History  ? ?Socioeconomic History  ? Marital status: Married  ?  Spouse name: [name]  ? Number of children: 1  ? Years of education: some college  ? Highest education level: 12th grade  ?Occupational History  ? Occupation: On disability  ?  Employer: UNEMPLOYED  ?Tobacco Use  ? Smoking status: Former  ?  Packs/day: 0.50  ?  Types: Cigarettes  ?  Quit date: 02/06/2016  ?  Years since quitting: 5.2  ? Smokeless tobacco: Never  ?Vaping Use  ? Vaping Use: Never used  ?Substance and Sexual Activity  ? Alcohol use: No  ?  Alcohol/week: 0.0 standard drinks  ? Drug use: No  ? Sexual activity: Not Currently  ?  Partners: Male  ?Other  Topics Concern  ? Not on file  ?Social History Narrative  ? On disability. Lives with her husband. She has a adopted daughter. She likes to read in her free time. She does chair exercises for 30-45 mins everyday.  ? ?Social Determinants of Health  ? ?Financial Resource Strain: Not on file  ?Food Insecurity: Not on file  ?Transportation Needs: Not on file  ?Physical Activity: Not on file  ?Stress: Not on file  ?Social Connections: Not on file  ? ?Past Surgical History:  ?Procedure Laterality Date  ? ABDOMINAL HYSTERECTOMY  May 2004  ? Basel Cell Carcinoma  06/2005, 07/2005  ? nasal tip and reconstruction  ? BREAST CYST ASPIRATION    ? CARPAL TUNNEL RELEASE  07/1993  ? both hands  ? COLONOSCOPY    ? HEMORROIDECTOMY  July 2003  ? KNEE ARTHROPLASTY  09/2001, 05/2003  ? left knee, chondromalasia  ? KNEE ARTHROSCOPY  jan 2005  ? Right knee, tisse release, chrondromalacia  ? REPLACEMENT TOTAL KNEE  Nov 2005  ? Left   ? THYROID LOBECTOMY  01/2005  ? malignant area removed from right lobe  ? THYROID SURGERY    ? thyroid lobe removal  ? TONSILLECTOMY  09/1972  ? TUBAL LIGATION  Sept 1999  ? Ulnar nerve entrapment  Feb 1995  ? left elbow  ? ?Past Surgical History:  ?Procedure Laterality Date  ? ABDOMINAL HYSTERECTOMY  May 2004  ? Basel Cell Carcinoma  06/2005, 07/2005  ? nasal tip and reconstruction  ? BREAST CYST ASPIRATION    ? CARPAL TUNNEL RELEASE  07/1993  ? both hands  ? COLONOSCOPY    ? HEMORROIDECTOMY  July 2003  ? KNEE ARTHROPLASTY  09/2001, 05/2003  ? left knee, chondromalasia  ? KNEE ARTHROSCOPY  jan 2005  ? Right knee, tisse release, chrondromalacia  ? REPLACEMENT TOTAL KNEE  Nov 2005  ? Left   ? THYROID LOBECTOMY  01/2005  ? malignant area removed from right lobe  ? THYROID SURGERY    ? thyroid lobe removal  ? TONSILLECTOMY  09/1972  ? TUBAL LIGATION  Sept 1999  ? Ulnar nerve entrapment  Feb 1995  ? left elbow  ? ?Past Medical History:  ?Diagnosis Date  ? Allergy   ? Anxiety   ? Cancer [facility])   ? cancerous nodules on thyroid   ? Cancer [facility])   ? basal cell Cancer-nose  ? Chronic pain syndrome   ? COPD (chronic obstructive pulmonary disease) ([place])   ? Degeneration of lumbar or lumbosacral intervertebral disc   ? Depression   ? Facet syndrome, lumbar   ? GERD (gastroesophageal reflux disease)   ? Hyperlipidemia   ? Intervertebral lumbar disc disorder with myelopathy, lumbar region   ? Lumbosacral spondylosis without myelopathy   ? Migraine without aura, with intractable migraine, so stated, without mention of status migrainosus   ? Primary localized osteoarthrosis, lower leg   ? Thyroid disease   ? hypothyroid  ? Unspecified musculoskeletal disorders and symptoms referable to neck   ? cervical/trapezius  ? ?BP 91/63   Pulse 79   Ht '5\' 8"'$  (1.727 m)   Wt 226 lb (102.5 kg)   SpO2 95%   BMI 34.36 kg/m?  ? ?Opioid Risk Score:   ?Fall Risk Score:  `1 ? ?Depression screen PHQ 2/9 ? ? ?  03/23/2021  ? 10:25 AM 02/23/2021  ?  9:52 AM 01/24/2021  ?  8:28 AM 12/23/2020  ? 10:54 AM 12/23/2020  ? 10:47 AM 10/22/2020  ?  8:48 AM 10/01/2020  ?  9:06 AM  ?Depression screen PHQ 2/9  ?Decreased Interest '1 1 1 '$ 0 0 1 1  ?Down, Depressed, Hopeless '1 1 1 3 '$ 0 1 1  ?PHQ - 2 Score '2 2 2 3 '$ 0 2 2  ?  ? ?Review of Systems  ?Gastrointestinal:  Negative for diarrhea.  ?Musculoskeletal:  Positive for back pain. Negative for joint swelling.  ?     Joint pain  ?All other systems reviewed and are negative. ? ?   ?Objective:  ? Physical Exam ?Vitals and nursing note reviewed.  ?Constitutional:   ?   Appearance: Normal appearance.  ?Cardiovascular:  ?   Rate and Rhythm: Normal rate and regular rhythm.  ?   Pulses: Normal pulses.  ?   Heart sounds: Normal heart sounds.  ?Pulmonary:  ?   Effort: Pulmonary effort is normal.  ?   Breath sounds: Normal breath sounds.  ?Musculoskeletal:  ?   Cervical back: Normal range of motion and neck supple.  ?   Comments: Normal Muscle Bulk and Muscle Testing Reveals:  ?Upper Extremities: Full ROM and Muscle Strength 5/5 ?Thoracic Paraspinal  Tenderness: T-4-T-6  ?Lumbar Paraspinal Tenderness: L-3-L-5 ?Right Greater Trochanter Tenderness ?Lower Extremities: Decreased ROM and Muscle Strength  5/5 ?Bilateral Lower Extremities Flexion Produces Pain into her Bilateral [place] ?Arises from Table Slowly using cane for support ?Antalgic  Gait  ?   ?Skin: ?   General: Skin is warm and dry.  ?Neurological:  ?   Mental Status: She is alert and oriented to person, place, and time.  ?Psychiatric:     ?   Mood and Affect: Mood normal.     ?   Behavior: Behavior normal.  ? ? ? ? ?   ?Assessment & Plan:  ?1.Chronic Bilateral Thoracic Back Pain/ Low Back Pain/ Lumbar Facet Arthropathy: Refilled MSIR 15 mg one tablet every 6 hours as needed #100 and Continue with Slow weaning of Morphine.  ? Refilled: MS Contin 60 mg and 30 mg Q 12 hours to equal   90 mg  #60.  Continue Gabapentin and Pamelor. 05/18/2021 ?We will continue the opioid monitoring program, this consists of regular clinic visits, examinations, urine drug screen, pill counts as well as use of [place] Controlled Substance Reporting system. A 12 month History has been reviewed on the [place] Controlled Substance Reporting System on 05/18/2021. ?2. Degenerative Disk Disease:Encouraged Continue HEP as Tolertated  and heat therapy. Continue Current Medication Regime.05/18/2020 ?3. Bilateral Greater Trochanteric Bursitis:  Continue current treatment regimen with Heat and Ice Therapy. 05/18/2021 ?4. Osteoarthritis of Bilateral Knees: R>L Continue current treatment modality with home exercise program and heat therapy. 05/18/2021 ?5. Migraine Headaches: Continue current medication regimen  Continue with  Migraine Journal. Aimovig discontinued due to Hives. Continue Lamictal and Relpax. Continue to Monitor.  05/18/2021 ?6. Smoking Cessation: She has quit smoking since 08/25/2016. Continue to Monitor. 05/18/2021 ?7.Constipation: Continue  Current medication regime with Linzess. 05/18/2021 ?8. Muscle Spasm:  Continue current medication regime with Tizanidine.05/18/2021 ?9. DepressionAnxiety/ Panic Attacks: Continue current medication regimen and treatment modality.  Celexa.Lamictal  and  Counseling with [name]

## 2021-05-19 ENCOUNTER — Encounter: Payer: Self-pay | Admitting: Registered Nurse

## 2021-05-25 LAB — TOXASSURE SELECT,+ANTIDEPR,UR

## 2021-05-26 ENCOUNTER — Telehealth: Payer: Self-pay | Admitting: *Deleted

## 2021-05-26 NOTE — Telephone Encounter (Signed)
Urine drug screen for this encounter is consistent for prescribed medication 

## 2021-05-30 ENCOUNTER — Ambulatory Visit (INDEPENDENT_AMBULATORY_CARE_PROVIDER_SITE_OTHER): Payer: Medicare Other | Admitting: Licensed Clinical Social Worker

## 2021-05-30 DIAGNOSIS — F411 Generalized anxiety disorder: Secondary | ICD-10-CM

## 2021-05-30 DIAGNOSIS — G894 Chronic pain syndrome: Secondary | ICD-10-CM

## 2021-05-30 DIAGNOSIS — M5136 Other intervertebral disc degeneration, lumbar region: Secondary | ICD-10-CM | POA: Diagnosis not present

## 2021-05-30 DIAGNOSIS — F331 Major depressive disorder, recurrent, moderate: Secondary | ICD-10-CM | POA: Diagnosis not present

## 2021-05-30 NOTE — Progress Notes (Signed)
?Virtual Visit via Telephone Note ? ?I connected with [name] on 05/30/21 at  8:00 AM EDT by telephone and verified that I am speaking with the correct person using two identifiers. ? ?Location: ?Patient: home ?Provider: office ?  ?I discussed the limitations, risks, security and privacy concerns of performing an evaluation and management service by telephone and the availability of in person appointments. I also discussed with the patient that there may be a patient responsible charge related to this service. The patient expressed understanding and agreed to proceed. ?  ?I discussed the assessment and treatment plan with the patient. The patient was provided an opportunity to ask questions and all were answered. The patient agreed with the plan and demonstrated an understanding of the instructions. ?  ?The patient was advised to call back or seek an in-person evaluation if the symptoms worsen or if the condition fails to improve as anticipated. ? ?I provided 53 minutes of non-face-to-face time during this encounter. ? ? ?THERAPIST PROGRESS NOTE ? ?Session Time: 8:00 AM to 8:53 AM ? ?Participation Level: Active ? ?Behavioral Response: CasualAlertDysphoric ? ?Type of Therapy: Individual Therapy ? ?Treatment Goals addressed: Decrease in depression, anxiety, stress management, coping ? ?ProgressTowards Goals: Progressing-patient benefits from therapy actively processing feelings, providing supportive interventions to help with symptom ? ?Interventions: CBT, Solution Focused, Strength-based, Supportive, and Other: coping ? ?Summary: [name] is a 61 y.o. female who presents with it had been rough bad headache had it for days and haven't been able to do anything and won't go away. New medicine doesn't work just like the old medicine didn't work. Discussed thinning hair spots bald freaked her out and bought wigs haven't been able to figure out how to put on. Going to try a product for hair  thinning. Other stressors the pharmacy messing up the medications. Talked about using pharmacies the problems but also ones that she liked. Talked about preparations if needs to have to go to hospital when has been out of has med list ready that has been helpful for emergency workers. Other things to be prepared for that some people don't think about made a will when she first got pregnant at 41. Grateful for coverage-has the [employer] would have been bankrupt with medical expenses. Also if husband passed would be able to stay on [employer]. If needed something Public affairs consultant can help in emergency situations.   ? ?Therapist reviewed symptoms, facilitated expression of thoughts and feelings utilize this as treatment strategy for patient to process through feelings related to continued struggles with medical issues that causes continued depression.  Therapist assesses helpful for patient to have therapy for supportive and strength-based interventions, as well as processing feelings to help with coping.  Discussed different stressors as well as strength-based in the sense of patient making preparations necessary for managing practicalities of life such as will, having veterans medical insurance help with cost of things.  Therapist provided CBT and solution focused brief therapy as well as providing support and space to patient as she shared her thoughts and feelings in session ?Suicidal/Homicidal: No ? ?Plan: Return again in 2 weeks.2.  Work on processing feelings to help decrease symptoms, coping ? ?Diagnosis: major depressive disorder, recurrent, moderate, degenerative disc disease, chronic pain syndrome, generalized anxiety disorder ? ?Collaboration of Care: Other none needed ? ?Patient/Guardian was advised Release of Information must be obtained prior to any record release in order to collaborate their care with an outside provider. Patient/Guardian  was advised if they have not already  done so to contact the registration department to sign all necessary forms in order for [place] to release information regarding their care.  ? ?Consent: Patient/Guardian gives verbal consent for treatment and assignment of benefits for services provided during this visit. Patient/Guardian expressed understanding and agreed to proceed.  ? ?[name], LCSW ?05/30/2021 ? ?

## 2021-06-01 ENCOUNTER — Other Ambulatory Visit: Payer: Self-pay | Admitting: Physical Medicine & Rehabilitation

## 2021-06-01 DIAGNOSIS — G43119 Migraine with aura, intractable, without status migrainosus: Secondary | ICD-10-CM

## 2021-06-13 ENCOUNTER — Ambulatory Visit (INDEPENDENT_AMBULATORY_CARE_PROVIDER_SITE_OTHER): Payer: Medicare Other | Admitting: Licensed Clinical Social Worker

## 2021-06-13 DIAGNOSIS — G894 Chronic pain syndrome: Secondary | ICD-10-CM | POA: Diagnosis not present

## 2021-06-13 DIAGNOSIS — F331 Major depressive disorder, recurrent, moderate: Secondary | ICD-10-CM

## 2021-06-13 DIAGNOSIS — F411 Generalized anxiety disorder: Secondary | ICD-10-CM

## 2021-06-13 DIAGNOSIS — M5136 Other intervertebral disc degeneration, lumbar region: Secondary | ICD-10-CM

## 2021-06-13 NOTE — Progress Notes (Signed)
Virtual Visit via Video Note ? ?I connected with [name] on 06/13/21 at  8:00 AM EDT by a video enabled telemedicine application and verified that I am speaking with the correct person using two identifiers. ? ?Location: ?Patient: home ?Provider: office ?  ?I discussed the limitations of evaluation and management by telemedicine and the availability of in person appointments. The patient expressed understanding and agreed to proceed. ?  ?I discussed the assessment and treatment plan with the patient. The patient was provided an opportunity to ask questions and all were answered. The patient agreed with the plan and demonstrated an understanding of the instructions. ?  ?The patient was advised to call back or seek an in-person evaluation if the symptoms worsen or if the condition fails to improve as anticipated. ? ?I provided 50 minutes of non-face-to-face time during this encounter. ? ?THERAPIST PROGRESS NOTE ? ?Session Time: 8:00 AM to 8:50 AM ? ?Participation Level: Active ? ?Behavioral Response: CasualAlertDepressed ? ?Type of Therapy: Individual Therapy ? ?Treatment Goals addressed:  Decrease in depression, anxiety, stress management, coping ? ?ProgressTowards Goals: Progressing-patient actively uses sessions to work through feelings to help her with coping helps to have an outlet and perspective with stressors she is managing ? ?Interventions: Solution Focused, Strength-based, Supportive, and Other: Coping ? ?Summary: [name] is a 61 y.o. female who presents with working on getting connection with video as we have been talking by phone.  Eventually went through [facility] and was successful therapist was pleased said hearing this help was a success.  Talked about advantages of tablet though patient not into new technology so much only uses different things for what she needs.  Noted some of the advantages of the tablet having different apps. [name] brother died this past week and his best  friend. 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  Patient felt bad couldn't go for service.  Patient shared there remains being spread on pet cemetery and patient also said they are doing what they are doing sort of [place] went when first start dating, did hiking there. Up to her scattered on  [facility] where used to go as a kid. [name] went there the day after Joe died. He been wanting to visit but didn't think the vehicle would talk him although patient not having problems driving for trips. Didn't want to leave patient. Patient said doesn't need a babysitter. It was too late. Told him feel guilty and knew he would. He went up there and wonderful everybody hugging him.  Spent time talking about connection with her dog Petrone some of his idiosyncrasies indication of great fondness for her dog. ? ?Some of session was working out issues with connectivity as we have been talking by phone and had to figure out how to talk through video ultimately successful in getting connected.  Processed through patient's feelings related to recent events loss of her husband's best friend and brother.  Patient shared about the relationship, going up there for funeral service and sharing also about her and her husband's own arrangement when they pass.  Noted helpful for patient to have an outlet to share feelings and find connection to help with mood through sessions.  Therapist provided support and space to patient as she shared her thoughts and feelings in session. ?Suicidal/Homicidal: No ? ?Plan: Return again in 2 weeks.2.Work on processing feelings to help decrease symptoms, coping ? ?Diagnosis:  ?major depressive disorder, recurrent, moderate, degenerative disc disease, chronic pain  syndrome, generalized anxiety disorder ? ?Collaboration of Care: Other none needed ? ?Patient/Guardian was advised Release of Information must be obtained prior to any record release in order to collaborate their care with an outside provider.  Patient/Guardian was advised if they have not already done so to contact the registration department to sign all necessary forms in order for [place] to release information regarding their care.  ? ?Consent: Patient/Guardian gives verbal consent for treatment and assignment of benefits for services provided during this visit. Patient/Guardian expressed understanding and agreed to proceed.  ? ?[name], LCSW ?06/13/2021 ? ?

## 2021-06-27 ENCOUNTER — Encounter: Payer: Self-pay | Admitting: Gastroenterology

## 2021-06-27 ENCOUNTER — Ambulatory Visit (INDEPENDENT_AMBULATORY_CARE_PROVIDER_SITE_OTHER): Payer: Medicare Other | Admitting: Licensed Clinical Social Worker

## 2021-06-27 DIAGNOSIS — M5136 Other intervertebral disc degeneration, lumbar region: Secondary | ICD-10-CM | POA: Diagnosis not present

## 2021-06-27 DIAGNOSIS — M51369 Other intervertebral disc degeneration, lumbar region without mention of lumbar back pain or lower extremity pain: Secondary | ICD-10-CM

## 2021-06-27 DIAGNOSIS — F331 Major depressive disorder, recurrent, moderate: Secondary | ICD-10-CM

## 2021-06-27 DIAGNOSIS — G894 Chronic pain syndrome: Secondary | ICD-10-CM

## 2021-06-27 DIAGNOSIS — F411 Generalized anxiety disorder: Secondary | ICD-10-CM | POA: Diagnosis not present

## 2021-06-27 NOTE — Progress Notes (Signed)
Virtual Visit via Video Note  I connected with [name] on 06/27/21 at  9:00 AM EDT by a video enabled telemedicine application and verified that I am speaking with the correct person using two identifiers.  Location: Patient: home Provider: office   I discussed the limitations of evaluation and management by telemedicine and the availability of in person appointments. The patient expressed understanding and agreed to proceed.  I discussed the assessment and treatment plan with the patient. The patient was provided an opportunity to ask questions and all were answered. The patient agreed with the plan and demonstrated an understanding of the instructions.   The patient was advised to call back or seek an in-person evaluation if the symptoms worsen or if the condition fails to improve as anticipated.  I provided 52 minutes of non-face-to-face time during this encounter.  THERAPIST PROGRESS NOTE  Session Time: 9:00 AM to 9:52 AM  Participation Level: Active  Behavioral Response: CasualAlertappropriate  Type of Therapy: Individual Therapy  Treatment Goals addressed: Decrease in depression, anxiety, stress management, coping  ProgressTowards Goals: Progressing-therapy itself is helpful for processing feelings helpful for stress management focus on positive that is helpful for mood  Interventions: Motivational Interviewing, Play Therapy, Supportive, and Other: Coping  Summary: [name] is a 61 y.o. female who presents with shopping for [name]'s birthday presents. [name] says that patient gives the best presents. Does shopping at [employer] comes twice a week. [name] has COPD so he can't be running around. Talked about her pets and there are habits, specific characteristics of both Petrone and [name]. [name] emotional support sometimes get upset and his goofy ways gets her laughing.  Talked about other things like ordering in food, shopping online sharing about her husband  who has been to over 80 countries.  Focused on positive subjects that are helpful for mood        Therapist assessment session helpful for mood as patient able to talk about activities she enjoys such as ordering food, shopping for her grandchild utilize strength-based intervention discussing patient's kindness and generosity to her grandchild.  Provided support and space to patient as she shared her thoughts and feelings in sessions utilize CBT and solution focused brief therapy to address mood and behavior.  Able to talk about other positive things such as animals in her house 1 being emotional support animal and how helpful that is to her.  Able to talk about other topics such as husband being marine and been all over the world interesting topics that therapist assesses helpful to focus on positive talk thanks for her wellbeing Suicidal/Homicidal: No  Plan: Return again in 2 weeks.2.Work on processing feelings to help decrease symptoms, coping  Diagnosis: major depressive disorder, recurrent, moderate, degenerative disc disease, chronic pain syndrome, generalized anxiety disorder  Collaboration of Care: Other none needed  Patient/Guardian was advised Release of Information must be obtained prior to any record release in order to collaborate their care with an outside provider. Patient/Guardian was advised if they have not already done so to contact the registration department to sign all necessary forms in order for [place] to release information regarding their care.   Consent: Patient/Guardian gives verbal consent for treatment and assignment of benefits for services provided during this visit. Patient/Guardian expressed understanding and agreed to proceed.   [name], LCSW 06/27/2021

## 2021-06-29 ENCOUNTER — Encounter: Payer: Self-pay | Admitting: Registered Nurse

## 2021-06-29 ENCOUNTER — Encounter: Payer: Medicare Other | Attending: Physical Medicine and Rehabilitation | Admitting: Registered Nurse

## 2021-06-29 ENCOUNTER — Encounter: Payer: Medicare Other | Admitting: Registered Nurse

## 2021-06-29 VITALS — BP 102/66 | HR 91 | Ht 68.0 in | Wt 228.0 lb

## 2021-06-29 DIAGNOSIS — M1712 Unilateral primary osteoarthritis, left knee: Secondary | ICD-10-CM | POA: Insufficient documentation

## 2021-06-29 DIAGNOSIS — G9050 Complex regional pain syndrome I, unspecified: Secondary | ICD-10-CM | POA: Diagnosis not present

## 2021-06-29 DIAGNOSIS — M5136 Other intervertebral disc degeneration, lumbar region: Secondary | ICD-10-CM | POA: Insufficient documentation

## 2021-06-29 DIAGNOSIS — Z79899 Other long term (current) drug therapy: Secondary | ICD-10-CM | POA: Diagnosis not present

## 2021-06-29 DIAGNOSIS — M1711 Unilateral primary osteoarthritis, right knee: Secondary | ICD-10-CM | POA: Insufficient documentation

## 2021-06-29 DIAGNOSIS — M546 Pain in thoracic spine: Secondary | ICD-10-CM | POA: Diagnosis not present

## 2021-06-29 DIAGNOSIS — G8929 Other chronic pain: Secondary | ICD-10-CM | POA: Diagnosis not present

## 2021-06-29 DIAGNOSIS — G894 Chronic pain syndrome: Secondary | ICD-10-CM | POA: Diagnosis not present

## 2021-06-29 DIAGNOSIS — M47816 Spondylosis without myelopathy or radiculopathy, lumbar region: Secondary | ICD-10-CM | POA: Insufficient documentation

## 2021-06-29 DIAGNOSIS — Z5181 Encounter for therapeutic drug level monitoring: Secondary | ICD-10-CM | POA: Diagnosis not present

## 2021-06-29 MED ORDER — MORPHINE SULFATE ER 30 MG PO TBCR: 30.0000 mg | EXTENDED_RELEASE_TABLET | Freq: Two times a day (BID) | ORAL | 0 refills | Status: DC

## 2021-06-29 MED ORDER — MORPHINE SULFATE 15 MG PO TABS: 15.0000 mg | ORAL_TABLET | Freq: Four times a day (QID) | ORAL | 0 refills | Status: DC | PRN

## 2021-06-29 MED ORDER — MORPHINE SULFATE ER 60 MG PO TBCR: 60.0000 mg | EXTENDED_RELEASE_TABLET | Freq: Two times a day (BID) | ORAL | 0 refills | Status: DC

## 2021-06-29 NOTE — Progress Notes (Signed)
Subjective:    Patient ID: [name], female    DOB: 1960-05-08, 61 y.o.   MRN: [id number]  HPI: [name] is a 61 y.o. female who returns for follow up appointment for chronic pain and medication refill. She states her pain is located in her mid-  lower back, bilateral hips and bilateral knee pain. She rates her pain 8. Her current exercise regime is walking and performing stretching exercises.  [name] Morphine equivalent is 211.60 MME.   Last UDS was Performed on 05/18/2021, it was consistent.    Pain Inventory Average Pain 8 Pain Right Now 8 My pain is sharp, burning, dull, stabbing, tingling, and aching  In the last 24 hours, has pain interfered with the following? General activity 10 Relation with others 10 Enjoyment of life 10 What TIME of day is your pain at its worst? morning , daytime, evening, and night Sleep (in general) Poor  Pain is worse with: walking, bending, standing, and some activites Pain improves with: rest, heat/ice, pacing activities, medication, and injections Relief from Meds: 4  Family History  Problem Relation Age of Onset   Depression Mother    Hypertension Mother    Hypertension Father    Heart failure Father    Diabetes Father    Heart attack Father    Colon cancer Neg Hx    Esophageal cancer Neg Hx    Stomach cancer Neg Hx    Rectal cancer Neg Hx    Social History   Socioeconomic History   Marital status: Married    Spouse name: [name]   Number of children: 1   Years of education: some college   Highest education level: 12th grade  Occupational History   Occupation: On disability    Employer: UNEMPLOYED  Tobacco Use   Smoking status: Former    Packs/day: 0.50    Types: Cigarettes    Quit date: 02/06/2016    Years since quitting: 5.3   Smokeless tobacco: Never  Vaping Use   Vaping Use: Never used  Substance and Sexual Activity   Alcohol use: No    Alcohol/week: 0.0 standard drinks   Drug use: No    Sexual activity: Not Currently    Partners: Male  Other Topics Concern   Not on file  Social History Narrative   On disability. Lives with her husband. She has a adopted daughter. She likes to read in her free time. She does chair exercises for 30-45 mins everyday.   Social Determinants of Health   Financial Resource Strain: Not on file  Food Insecurity: Not on file  Transportation Needs: Not on file  Physical Activity: Not on file  Stress: Not on file  Social Connections: Not on file   Past Surgical History:  Procedure Laterality Date   ABDOMINAL HYSTERECTOMY  May 2004   [place] Cell Carcinoma  06/2005, 07/2005   nasal tip and reconstruction   BREAST CYST ASPIRATION     CARPAL TUNNEL RELEASE  07/1993   both hands   COLONOSCOPY     HEMORROIDECTOMY  July 2003   KNEE ARTHROPLASTY  09/2001, 05/2003   left knee, chondromalasia   KNEE ARTHROSCOPY  jan 2005   Right knee, tisse release, chrondromalacia   REPLACEMENT TOTAL KNEE  Nov 2005   Left    THYROID LOBECTOMY  01/2005   malignant area removed from right lobe   THYROID SURGERY     thyroid lobe removal   TONSILLECTOMY  09/1972  TUBAL LIGATION  Sept 1999   Ulnar nerve entrapment  Feb 1995   left elbow   Past Surgical History:  Procedure Laterality Date   ABDOMINAL HYSTERECTOMY  May 2004   [place] Cell Carcinoma  06/2005, 07/2005   nasal tip and reconstruction   BREAST CYST ASPIRATION     CARPAL TUNNEL RELEASE  07/1993   both hands   COLONOSCOPY     HEMORROIDECTOMY  July 2003   KNEE ARTHROPLASTY  09/2001, 05/2003   left knee, chondromalasia   KNEE ARTHROSCOPY  jan 2005   Right knee, tisse release, chrondromalacia   REPLACEMENT TOTAL KNEE  Nov 2005   Left    THYROID LOBECTOMY  01/2005   malignant area removed from right lobe   THYROID SURGERY     thyroid lobe removal   TONSILLECTOMY  09/1972   TUBAL LIGATION  Sept 1999   Ulnar nerve entrapment  Feb 1995   left elbow   Past Medical History:  Diagnosis Date   Allergy     Anxiety    Cancer ([place])    cancerous nodules on thyroid   Cancer ([place])    basal cell Cancer-nose   Chronic pain syndrome    COPD (chronic obstructive pulmonary disease) (HCC)    Degeneration of lumbar or lumbosacral intervertebral disc    Depression    Facet syndrome, lumbar    GERD (gastroesophageal reflux disease)    Hyperlipidemia    Intervertebral lumbar disc disorder with myelopathy, lumbar region    Lumbosacral spondylosis without myelopathy    Migraine without aura, with intractable migraine, so stated, without mention of status migrainosus    Primary localized osteoarthrosis, lower leg    Thyroid disease    hypothyroid   Unspecified musculoskeletal disorders and symptoms referable to neck    cervical/trapezius   BP 102/66   Pulse 91   Ht '5\' 8"'$  (1.727 m)   Wt 228 lb (103.4 kg)   SpO2 96%   BMI 34.67 kg/m   Opioid Risk Score:   Fall Risk Score:  `1  Depression screen [facility] 2/9     06/29/2021   10:37 AM 03/23/2021   10:25 AM 02/23/2021    9:52 AM 01/24/2021    8:28 AM 12/23/2020   10:54 AM 12/23/2020   10:47 AM 10/22/2020    8:48 AM  Depression screen PHQ 2/9  Decreased Interest '2 1 1 1 '$ 0 0 1  Down, Depressed, Hopeless '2 1 1 1 3 '$ 0 1  PHQ - 2 Score '4 2 2 2 3 '$ 0 2      Review of Systems  Musculoskeletal:  Positive for back pain and myalgias.       Bilateral shoulder, interior elbow, wrist pain Right leg and knee pain  All other systems reviewed and are negative.     Objective:   Physical Exam Vitals and nursing note reviewed.  Constitutional:      Appearance: Normal appearance.  Cardiovascular:     Rate and Rhythm: Normal rate and regular rhythm.     Pulses: Normal pulses.     Heart sounds: Normal heart sounds.  Pulmonary:     Effort: Pulmonary effort is normal.     Breath sounds: Normal breath sounds.  Musculoskeletal:     Cervical back: Normal range of motion and neck supple.     Comments: Normal Muscle Bulk and Muscle Testing Reveals:  Upper  Extremities: Full ROM and Muscle Strength  5/5 Thoracic Paraspinal Tenderness: T-7-T-9 Lumbar Paraspinal Tenderness:  L-4-L-5 Bilateral Greater Trochanter Tenderness Lower Extremities: Decreased ROM and Muscle Strength 5/5 Bilateral Lower Extremities Flexion Produces Pain into her Bilateral Patella's Arises from Table slowly using cane for Support Narrow Based  Gait     Skin:    General: Skin is warm and dry.  Neurological:     Mental Status: She is alert and oriented to person, place, and time.  Psychiatric:        Mood and Affect: Mood normal.        Behavior: Behavior normal.         Assessment & Plan:  1.Chronic Bilateral Thoracic Back Pain/ Low Back Pain/ Lumbar Facet Arthropathy: Refilled MSIR 15 mg one tablet every 6 hours as needed #100 and Continue with Slow weaning of Morphine.   Refilled: MS Contin 60 mg and 30 mg Q 12 hours to equal   90 mg  #60.  Continue Gabapentin and Pamelor. 05/18/2021 We will continue the opioid monitoring program, this consists of regular clinic visits, examinations, urine drug screen, pill counts as well as use of [place] Controlled Substance Reporting system. A 12 month History has been reviewed on the [place] on 06/29/2021. 2. Degenerative Disk Disease:Encouraged Continue HEP as Tolertated  and heat therapy. Continue Current Medication Regime.06/29/2020 3. Bilateral Greater Trochanteric Bursitis:  Continue current treatment regimen with Heat and Ice Therapy. 06/29/2021 4. Osteoarthritis of Bilateral Knees: R>L Continue current treatment modality with home exercise program and heat therapy. 06/29/2021 5. Migraine Headaches: Continue current medication regimen  Continue with  Migraine Journal. Aimovig discontinued due to Hives. Continue Lamictal and Relpax. Continue to Monitor.  06/29/2021 6. Smoking Cessation: She has quit smoking since 08/25/2016. Continue to Monitor. 06/29/2021 7.Constipation:  Continue  Current medication regime with Linzess. 06/29/2021 8. Muscle Spasm: Continue current medication regime with Tizanidine.06/29/2021 9. DepressionAnxiety/ Panic Attacks: Continue current medication regimen and treatment modality.  Celexa.Lamictal  and  Counseling with [name] and  Dr. De Nurse Psychiatrist. 06/29/2021 10. RSD: Continue with current medication Regime with Gabapentin. 06/29/2021   F/U in 1 month

## 2021-07-11 ENCOUNTER — Ambulatory Visit (INDEPENDENT_AMBULATORY_CARE_PROVIDER_SITE_OTHER): Payer: Medicare Other | Admitting: Licensed Clinical Social Worker

## 2021-07-11 DIAGNOSIS — G894 Chronic pain syndrome: Secondary | ICD-10-CM

## 2021-07-11 DIAGNOSIS — M5136 Other intervertebral disc degeneration, lumbar region: Secondary | ICD-10-CM | POA: Diagnosis not present

## 2021-07-11 DIAGNOSIS — F331 Major depressive disorder, recurrent, moderate: Secondary | ICD-10-CM

## 2021-07-11 DIAGNOSIS — F411 Generalized anxiety disorder: Secondary | ICD-10-CM | POA: Diagnosis not present

## 2021-07-11 NOTE — Progress Notes (Signed)
Virtual Visit via Video Note  I connected with [name] on 07/11/21 at  8:00 AM EDT by a video enabled telemedicine application and verified that I am speaking with the correct person using two identifiers.  Location: Patient: home Provider: office   I discussed the limitations of evaluation and management by telemedicine and the availability of in person appointments. The patient expressed understanding and agreed to proceed.   I discussed the assessment and treatment plan with the patient. The patient was provided an opportunity to ask questions and all were answered. The patient agreed with the plan and demonstrated an understanding of the instructions.   The patient was advised to call back or seek an in-person evaluation if the symptoms worsen or if the condition fails to improve as anticipated.  I provided 52 minutes of non-face-to-face time during this encounter.   THERAPIST PROGRESS NOTE  Session Time: 8:00 AM to 8:52 AM  Participation Level: Active  Behavioral Response: CasualAlertappropriate  Type of Therapy: Individual Therapy  Treatment Goals addressed: Decrease in depression, anxiety, stress management, coping  ProgressTowards Goals: Progressing-noted effectiveness of therapy sessions for mood processing feelings to help with coping  Interventions: Solution Focused, Strength-based, Supportive, and Other: Coping  Summary: [name] is a 61 y.o. female who presents with up all night hasn't been sleeping. Not been sleepy at night. Explored what she does she picks out a movie, look at [employer]. Has 30,000 pins. Talked about some of her boards, for example, guys she thinks are attractive. Talked about a plot of a show she enjoys. Has a food board. Talked about other boards she has. Has big books of Peanuts cartoons when younger from parents because she liked them so much.  Carried around a pitcher snoopy until 33 finds pins of Peanuts. Talked about  [name]. Misses seeing people as she is at home ends up talking to dog. Talked about memories including her wedding to [place] in [place].Her friend lived there patient said her best friend, Stanton Kidney. Winnie Barsky died about 6 years ago. Miss her and have pictures on wall with all of them on beach and different houses.  Talked about enjoying being in the pool when patient says can walk in the water and it doesn't hurt so much. One of your dreams endless pool. Could lose weight.      Therapist provided space and support for patient to talk about thoughts and feelings assess helpful for patient's mood to talk about various subjects she enjoys such as Database administrator, shows she is enjoyed, fond memories such as her wedding and memories of her best friend.  Other things she enjoys such as long time interest and peanuts.  Therapist offered her own feedback about how Pinterest offers a lot of different resources really were taking advantage of.  Has sites that teach you to draw, recommendations of books lot of different features to take advantage of.  Noted ongoing support from emotional support dog.  Noted effectiveness of interventions through patient's engagement and positive mood Suicidal/Homicidal: No  Plan: Return again in 2 weeks.  Diagnosis:  major depressive disorder, recurrent, moderate, degenerative disc disease, chronic pain syndrome, generalized anxiety disorder  Collaboration of Care: Other none needed  Patient/Guardian was advised Release of Information must be obtained prior to any record release in order to collaborate their care with an outside provider. Patient/Guardian was advised if they have not already done so to contact the registration department to sign all necessary forms in order for [place] to  release information regarding their care.   Consent: Patient/Guardian gives verbal consent for treatment and assignment of benefits for services provided during this visit. Patient/Guardian expressed understanding and  agreed to proceed.   [name], LCSW 07/11/2021

## 2021-07-25 ENCOUNTER — Ambulatory Visit (INDEPENDENT_AMBULATORY_CARE_PROVIDER_SITE_OTHER): Payer: Medicare Other | Admitting: Licensed Clinical Social Worker

## 2021-07-25 DIAGNOSIS — G894 Chronic pain syndrome: Secondary | ICD-10-CM | POA: Diagnosis not present

## 2021-07-25 DIAGNOSIS — M5136 Other intervertebral disc degeneration, lumbar region: Secondary | ICD-10-CM

## 2021-07-25 DIAGNOSIS — F331 Major depressive disorder, recurrent, moderate: Secondary | ICD-10-CM | POA: Diagnosis not present

## 2021-07-25 DIAGNOSIS — F411 Generalized anxiety disorder: Secondary | ICD-10-CM

## 2021-07-25 NOTE — Progress Notes (Signed)
THERAPIST PROGRESS NOTE   Virtual Visit via Telephone Note  I connected with [name] on 07/25/21 at  9:00 AM EDT by telephone and verified that I am speaking with the correct person using two identifiers.  Location: Patient: home Provider: office   I discussed the limitations, risks, security and privacy concerns of performing an evaluation and management service by telephone and the availability of in person appointments. I also discussed with the patient that there may be a patient responsible charge related to this service. The patient expressed understanding and agreed to proceed.    I discussed the assessment and treatment plan with the patient. The patient was provided an opportunity to ask questions and all were answered. The patient agreed with the plan and demonstrated an understanding of the instructions.   The patient was advised to call back or seek an in-person evaluation if the symptoms worsen or if the condition fails to improve as anticipated.  I provided 54 minutes of non-face-to-face time during this encounter.  THERAPIST PROGRESS NOTE  Session Time: 9:00 AM to 9:54 AM  Participation Level: Active  Behavioral Response: CasualAlertappropriate  Type of Therapy: Individual Therapy  Treatment Goals addressed: Decrease in depression, anxiety, stress management, coping  ProgressTowards Goals: Progressing-helpful for patient to engage therapy for support, processing of thoughts and feelings  Interventions: Solution Focused, Strength-based, Supportive, and Other: Coping  Summary: DWIGHT BURDO is a 61 y.o. female who presents with wondered if [name] knows better life and patient says sure she does. Now settled in thinks she wouldn't leave, give her treats. Talked about documentaries she has found interesting.  Explored what is new since therapist last talked with patient.  [name] came by to clean a neighbor and a friend she is family now. She  comes over every other week.Has been watching streaming things and into [employer]. Loves recipe reading. Collecting recipes has two sections."Yummy Tummy." Divided into sections. "Never too late for pie." Over 50,000 pins. Therapist noted a positive thing to take up her time. Sex board guys she likes.  Therapist and patient had fun talking about acute movie stars, talked about some cooking tips.  Therapist reviewed symptoms, facilitated expression of thoughts and feeling assess helpful for patient mood to connect with therapist as she is isolated talk about some of her different interests.  Assess patient spending a lot of time with Pinterest as a positive activity for her a lot of ways to positively engaged in talked about some of the things she enjoyed in this session such as gathering recipes, acute movie stars.  Assess mood is improved through social connection as well as positive focus.  Therapist utilized CBT solution focused provided space and support for patient to talk about thoughts and feelings in session Suicidal/Homicidal: No  Plan: Return again in 2 weeks.2.  Utilize session for processing of thoughts and feelings, stress management, mood enhancement  Diagnosis:  major depressive disorder, recurrent, moderate, degenerative disc disease, chronic pain syndrome, generalized anxiety disorder  Collaboration of Care: Other none needed  Patient/Guardian was advised Release of Information must be obtained prior to any record release in order to collaborate their care with an outside provider. Patient/Guardian was advised if they have not already done so to contact the registration department to sign all necessary forms in order for [place] to release information regarding their care.   Consent: Patient/Guardian gives verbal consent for treatment and assignment of benefits for services provided during this visit. Patient/Guardian expressed understanding and  agreed to proceed.   [name],  LCSW 07/25/2021

## 2021-07-27 ENCOUNTER — Encounter: Payer: Medicare Other | Attending: Physical Medicine and Rehabilitation | Admitting: Registered Nurse

## 2021-07-27 ENCOUNTER — Encounter: Payer: Self-pay | Admitting: Registered Nurse

## 2021-07-27 VITALS — BP 102/66 | HR 91 | Ht 68.0 in | Wt 225.0 lb

## 2021-07-27 DIAGNOSIS — Z5181 Encounter for therapeutic drug level monitoring: Secondary | ICD-10-CM | POA: Diagnosis not present

## 2021-07-27 DIAGNOSIS — M1711 Unilateral primary osteoarthritis, right knee: Secondary | ICD-10-CM | POA: Insufficient documentation

## 2021-07-27 DIAGNOSIS — M47816 Spondylosis without myelopathy or radiculopathy, lumbar region: Secondary | ICD-10-CM | POA: Diagnosis not present

## 2021-07-27 DIAGNOSIS — M1712 Unilateral primary osteoarthritis, left knee: Secondary | ICD-10-CM | POA: Insufficient documentation

## 2021-07-27 DIAGNOSIS — G894 Chronic pain syndrome: Secondary | ICD-10-CM | POA: Insufficient documentation

## 2021-07-27 DIAGNOSIS — M5136 Other intervertebral disc degeneration, lumbar region: Secondary | ICD-10-CM | POA: Diagnosis not present

## 2021-07-27 DIAGNOSIS — G8929 Other chronic pain: Secondary | ICD-10-CM | POA: Insufficient documentation

## 2021-07-27 DIAGNOSIS — Z79899 Other long term (current) drug therapy: Secondary | ICD-10-CM | POA: Diagnosis not present

## 2021-07-27 DIAGNOSIS — M546 Pain in thoracic spine: Secondary | ICD-10-CM | POA: Diagnosis not present

## 2021-07-27 DIAGNOSIS — G9050 Complex regional pain syndrome I, unspecified: Secondary | ICD-10-CM | POA: Insufficient documentation

## 2021-07-27 MED ORDER — CITALOPRAM HYDROBROMIDE 40 MG PO TABS: 40.0000 mg | ORAL_TABLET | Freq: Every day | ORAL | 5 refills | Status: DC

## 2021-07-27 MED ORDER — MORPHINE SULFATE ER 60 MG PO TBCR: 60.0000 mg | EXTENDED_RELEASE_TABLET | Freq: Two times a day (BID) | ORAL | 0 refills | Status: DC

## 2021-07-27 MED ORDER — MORPHINE SULFATE ER 30 MG PO TBCR: 30.0000 mg | EXTENDED_RELEASE_TABLET | Freq: Two times a day (BID) | ORAL | 0 refills | Status: DC

## 2021-07-27 MED ORDER — MORPHINE SULFATE 15 MG PO TABS: 15.0000 mg | ORAL_TABLET | Freq: Four times a day (QID) | ORAL | 0 refills | Status: DC | PRN

## 2021-07-27 NOTE — Progress Notes (Signed)
Subjective:    Patient ID: BRISSA ASANTE, female    DOB: May 01, 1960, 61 y.o.   MRN: [id number]  HPI: [name] is a 61 y.o. female who is scheduled forMy-Chart  appointment for chronic pain and medication refill. We have  discussed the limitations of evaluation and management by telemedicine and the availability of in person appointments. The patient expressed understanding and agreed to proceed  She states her pain is located in her mid- back, lower back pain mainly right side and bilateral knee pain R>L. She rates her pain 8. Her current exercise regime is walking and performing stretching exercises.  Ms. Arts Morphine equivalent is 180.00 MME.   Last UDS was Performed on 05/18/2021, it was consistent.     Pain Inventory Average Pain 8 Pain Right Now 8 My pain is constant, sharp, burning, tingling, and aching  In the last 24 hours, has pain interfered with the following? General activity 10 Relation with others 10 Enjoyment of life 10 What TIME of day is your pain at its worst? morning , daytime, evening, and night Sleep (in general) Poor  Pain is worse with: walking, bending, inactivity, standing, and some activites Pain improves with: heat/ice and medication Relief from Meds: 7  Family History  Problem Relation Age of Onset   Depression Mother    Hypertension Mother    Hypertension Father    Heart failure Father    Diabetes Father    Heart attack Father    Colon cancer Neg Hx    Esophageal cancer Neg Hx    Stomach cancer Neg Hx    Rectal cancer Neg Hx    Social History   Socioeconomic History   Marital status: Married    Spouse name: [name]   Number of children: 1   Years of education: some college   Highest education level: 12th grade  Occupational History   Occupation: On disability    Employer: UNEMPLOYED  Tobacco Use   Smoking status: Former    Packs/day: 0.50    Types: Cigarettes    Quit date: 02/06/2016    Years since  quitting: 5.4   Smokeless tobacco: Never  Vaping Use   Vaping Use: Never used  Substance and Sexual Activity   Alcohol use: No    Alcohol/week: 0.0 standard drinks of alcohol   Drug use: No   Sexual activity: Not Currently    Partners: Male  Other Topics Concern   Not on file  Social History Narrative   On disability. Lives with her husband. She has a adopted daughter. She likes to read in her free time. She does chair exercises for 30-45 mins everyday.   Social Determinants of Health   Financial Resource Strain: Low Risk  (04/30/2020)   Overall Financial Resource Strain (CARDIA)    Difficulty of Paying Living Expenses: Not hard at all  Food Insecurity: No Food Insecurity (04/30/2020)   Hunger Vital Sign    Worried About Running Out of Food in the Last Year: Never true    Ran Out of Food in the Last Year: Never true  Transportation Needs: No Transportation Needs (04/30/2020)   PRAPARE - Hydrologist (Medical): No    Lack of Transportation (Non-Medical): No  Physical Activity: Inactive (04/30/2020)   Exercise Vital Sign    Days of Exercise per Week: 0 days    Minutes of Exercise per Session: 0 min  Stress: No Stress Concern Present (04/30/2020)   [place]  [facility]    Feeling of Stress : Not at all  Social Connections: Socially Isolated (04/30/2020)   Social Connection and Isolation Panel [NHANES]    Frequency of Communication with Friends and Family: Never    Frequency of Social Gatherings with Friends and Family: Once a week    Attends Religious Services: Never    Marine scientist or Organizations: No    Attends Archivist Meetings: Never    Marital Status: Married   Past Surgical History:  Procedure Laterality Date   ABDOMINAL HYSTERECTOMY  May 2004   Basel Cell Carcinoma  06/2005, 07/2005   nasal tip and reconstruction   BREAST CYST ASPIRATION     CARPAL TUNNEL RELEASE   07/1993   both hands   COLONOSCOPY     HEMORROIDECTOMY  July 2003   KNEE ARTHROPLASTY  09/2001, 05/2003   left knee, chondromalasia   KNEE ARTHROSCOPY  jan 2005   Right knee, tisse release, chrondromalacia   REPLACEMENT TOTAL KNEE  Nov 2005   Left    THYROID LOBECTOMY  01/2005   malignant area removed from right lobe   THYROID SURGERY     thyroid lobe removal   TONSILLECTOMY  09/1972   TUBAL LIGATION  Sept 1999   Ulnar nerve entrapment  Feb 1995   left elbow   Past Surgical History:  Procedure Laterality Date   ABDOMINAL HYSTERECTOMY  May 2004   Basel Cell Carcinoma  06/2005, 07/2005   nasal tip and reconstruction   BREAST CYST ASPIRATION     CARPAL TUNNEL RELEASE  07/1993   both hands   COLONOSCOPY     HEMORROIDECTOMY  July 2003   KNEE ARTHROPLASTY  09/2001, 05/2003   left knee, chondromalasia   KNEE ARTHROSCOPY  jan 2005   Right knee, tisse release, chrondromalacia   REPLACEMENT TOTAL KNEE  Nov 2005   Left    THYROID LOBECTOMY  01/2005   malignant area removed from right lobe   THYROID SURGERY     thyroid lobe removal   TONSILLECTOMY  09/1972   TUBAL LIGATION  Sept 1999   Ulnar nerve entrapment  Feb 1995   left elbow   Past Medical History:  Diagnosis Date   Allergy    Anxiety    Cancer ([place])    cancerous nodules on thyroid   Cancer ([place])    basal cell Cancer-nose   Chronic pain syndrome    COPD (chronic obstructive pulmonary disease) ([place])    Degeneration of lumbar or lumbosacral intervertebral disc    Depression    Facet syndrome, lumbar    GERD (gastroesophageal reflux disease)    Hyperlipidemia    Intervertebral lumbar disc disorder with myelopathy, lumbar region    Lumbosacral spondylosis without myelopathy    Migraine without aura, with intractable migraine, so stated, without mention of status migrainosus    Primary localized osteoarthrosis, lower leg    Thyroid disease    hypothyroid   Unspecified musculoskeletal disorders and symptoms referable to  neck    cervical/trapezius   BP 102/66 Comment: per last visit  Pulse 91 Comment: per last visit  Ht '5\' 8"'$  (1.727 m)   Wt 225 lb (102.1 kg) Comment: per patient  SpO2 96% Comment: per last visit  BMI 34.21 kg/m   Opioid Risk Score:   Fall Risk Score:  `1  Depression screen [facility] 2/9     07/27/2021   12:43 PM 06/29/2021  10:37 AM 03/23/2021   10:25 AM 02/23/2021    9:52 AM 01/24/2021    8:28 AM 12/23/2020   10:54 AM 12/23/2020   10:47 AM  Depression screen PHQ 2/9  Decreased Interest '3 2 1 1 1 '$ 0 0  Down, Depressed, Hopeless '3 2 1 1 1 3 '$ 0  PHQ - 2 Score '6 4 2 2 2 3 '$ 0     Review of Systems  Constitutional: Negative.   HENT: Negative.    Eyes: Negative.   Respiratory: Negative.    Cardiovascular: Negative.   Gastrointestinal: Negative.   Endocrine: Negative.   Genitourinary: Negative.   Musculoskeletal:  Positive for back pain.  Skin: Negative.   Allergic/Immunologic: Negative.   Neurological: Negative.   Psychiatric/Behavioral:  Positive for dysphoric mood and sleep disturbance.       Objective:   Physical Exam Vitals and nursing note reviewed.  Constitutional:      Appearance: Normal appearance.  Cardiovascular:     Rate and Rhythm: Normal rate and regular rhythm.     Pulses: Normal pulses.     Heart sounds: Normal heart sounds.  Pulmonary:     Effort: Pulmonary effort is normal.     Breath sounds: Normal breath sounds.  Musculoskeletal:     Cervical back: Normal range of motion and neck supple.     Comments: No Physical Exam Performed: Virtual Visit  Skin:    General: Skin is warm and dry.  Neurological:     Mental Status: She is alert and oriented to person, place, and time.  Psychiatric:        Mood and Affect: Mood normal.        Behavior: Behavior normal.         Assessment & Plan:   1.Chronic Bilateral Thoracic Back Pain/ Low Back Pain/ Lumbar Facet Arthropathy: Refilled MSIR 15 mg one tablet every 6 hours as needed #100 and Continue with  Slow weaning of Morphine.   Refilled: MS Contin 60 mg and 30 mg Q 12 hours to equal   90 mg  #60.  Continue Gabapentin and Pamelor. 07/27/2021 We will continue the opioid monitoring program, this consists of regular clinic visits, examinations, urine drug screen, pill counts as well as use of [place] Controlled Substance Reporting system. A 12 month History has been reviewed on the [place] on 07/27/2021. 2. Degenerative Disk Disease:Encouraged Continue HEP as Tolertated  and heat therapy. Continue Current Medication Regime.07/27/2021 3. Bilateral Greater Trochanteric Bursitis:  Continue current treatment regimen with Heat and Ice Therapy. 07/27/2021 4. Osteoarthritis of Bilateral Knees: R>L Continue current treatment modality with home exercise program and heat therapy. 07/27/2021 5. Migraine Headaches: Continue current medication regimen  Continue with  Migraine Journal. Aimovig discontinued due to Hives. Continue Lamictal and Relpax. Continue to Monitor.  07/27/2021 6. Smoking Cessation: She has quit smoking since 08/25/2016. Continue to Monitor. 07/27/2021 7.Constipation: Continue  Current medication regime with Linzess. 07/27/2021 8. Muscle Spasm: Continue current medication regime with Tizanidine.07/27/2021 9. DepressionAnxiety/ Panic Attacks: Continue current medication regimen and treatment modality.  Celexa.Lamictal  and  Counseling with [name] and  Dr. De Nurse Psychiatrist. 07/27/2021 10. RSD: Continue with current medication Regime with Gabapentin. 07/27/2021   F/U in 1 month  My Chart Video Visit Established Patient Location of Patient: In her Home  Location of Provider: In the Office

## 2021-07-31 ENCOUNTER — Other Ambulatory Visit: Payer: Self-pay | Admitting: Family Medicine

## 2021-07-31 ENCOUNTER — Other Ambulatory Visit: Payer: Self-pay | Admitting: Physical Medicine & Rehabilitation

## 2021-07-31 DIAGNOSIS — N3281 Overactive bladder: Secondary | ICD-10-CM

## 2021-08-08 ENCOUNTER — Ambulatory Visit (INDEPENDENT_AMBULATORY_CARE_PROVIDER_SITE_OTHER): Payer: Medicare Other | Admitting: Licensed Clinical Social Worker

## 2021-08-08 DIAGNOSIS — M5136 Other intervertebral disc degeneration, lumbar region: Secondary | ICD-10-CM

## 2021-08-08 DIAGNOSIS — G894 Chronic pain syndrome: Secondary | ICD-10-CM | POA: Diagnosis not present

## 2021-08-08 DIAGNOSIS — F411 Generalized anxiety disorder: Secondary | ICD-10-CM

## 2021-08-08 DIAGNOSIS — F331 Major depressive disorder, recurrent, moderate: Secondary | ICD-10-CM

## 2021-08-08 NOTE — Progress Notes (Signed)
Virtual Visit via Video Note  I connected with [name] on 08/08/21 at  8:00 AM EDT by a video enabled telemedicine application and verified that I am speaking with the correct person using two identifiers.  Location: Patient: home Provider: office   I discussed the limitations of evaluation and management by telemedicine and the availability of in person appointments. The patient expressed understanding and agreed to proceed.   I discussed the assessment and treatment plan with the patient. The patient was provided an opportunity to ask questions and all were answered. The patient agreed with the plan and demonstrated an understanding of the instructions.   The patient was advised to call back or seek an in-person evaluation if the symptoms worsen or if the condition fails to improve as anticipated.  I provided 53 minutes of non-face-to-face time during this encounter.  THERAPIST PROGRESS NOTE  Session Time: 8:00 AM to 8:53 AM  Participation Level: Active  Behavioral Response: CasualAlertappropriate but assessed dysthymic thinking about past memories  Type of Therapy: Individual Therapy  Treatment Goals addressed: Decrease in depression, anxiety, stress management, coping  ProgressTowards Goals: Progressing-assessed patient actively using sessions to process feelings helpful for different things such as coping with anniversaries of loss for this session  Interventions: Solution Focused, Strength-based, Supportive, and Other: Grief, coping  Summary: [name] is a 61 y.o. female who presents with 4th July hard holiday  Dad's birthday. Big holiday for family, Mom's birthday on July 1st. [place]. Explored plans for the holiday and patient said for her it is such effort, swelling 3-4 days to get it down do no plans.Talked about fond memories of the past. Dad go for rides after church. Go as far as [place]. 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  Other memory shared when young and married.  Transition to different topics talking about things like liking some day to write a train, enjoying baby elephants topics that help enhance mood  Therapist provided space and support for patient to talk about thoughts and feelings.  Noted 4 July difficult holiday dad's birthday.  Talked about some of patient's memories of celebrating the fourth, assess helpful to talk about memories with grieving.  Transition to talking about other positive memories assess helpful for mood.  Helpful to talk about life experiences her interest particularly as patient is isolated as she herself explains difficult to get places so helpful for her wellbeing to share about different interests different experiences.  Therapist also utilized solution based and CBT intervention Suicidal/Homicidal: No  Plan: Return again in 2 weeks.2. Utilize session for processing of thoughts and feelings, stress management, mood enhancement  Diagnosis:  major depressive disorder, recurrent, moderate, degenerative disc disease, chronic pain syndrome, generalized anxiety disorder  Collaboration of Care: Other none needed  Patient/Guardian was advised Release of Information must be obtained prior to any record release in order to collaborate their care with an outside provider. Patient/Guardian was advised if they have not already done so to contact the registration department to sign all necessary forms in order for [place] to release information regarding their care.   Consent: Patient/Guardian gives verbal consent for treatment and assignment of benefits for services provided during this visit. Patient/Guardian expressed understanding and agreed to proceed.   [name], LCSW 08/08/2021

## 2021-08-22 ENCOUNTER — Ambulatory Visit (INDEPENDENT_AMBULATORY_CARE_PROVIDER_SITE_OTHER): Payer: Medicare Other | Admitting: Licensed Clinical Social Worker

## 2021-08-22 DIAGNOSIS — F331 Major depressive disorder, recurrent, moderate: Secondary | ICD-10-CM

## 2021-08-22 DIAGNOSIS — M5136 Other intervertebral disc degeneration, lumbar region: Secondary | ICD-10-CM

## 2021-08-22 DIAGNOSIS — G894 Chronic pain syndrome: Secondary | ICD-10-CM

## 2021-08-22 DIAGNOSIS — F411 Generalized anxiety disorder: Secondary | ICD-10-CM | POA: Diagnosis not present

## 2021-08-22 NOTE — Progress Notes (Signed)
Virtual Visit via Video Note  I connected with [name] on 08/22/21 at  8:00 AM EDT by a video enabled telemedicine application and verified that I am speaking with the correct person using two identifiers.  Location: Patient: home Provider: office   I discussed the limitations of evaluation and management by telemedicine and the availability of in person appointments. The patient expressed understanding and agreed to proceed.  I discussed the assessment and treatment plan with the patient. The patient was provided an opportunity to ask questions and all were answered. The patient agreed with the plan and demonstrated an understanding of the instructions.   The patient was advised to call back or seek an in-person evaluation if the symptoms worsen or if the condition fails to improve as anticipated.  I provided 52 minutes of non-face-to-face time during this encounter.  THERAPIST PROGRESS NOTE  Session Time: 8:00 AM to 8:52 AM  Participation Level: Active  Behavioral Response: CasualAlertDysphoric  Type of Therapy: Individual Therapy  Treatment Goals addressed: Decrease in depression, anxiety, stress management, coping  ProgressTowards Goals: Progressing-patient is struggling with medical issues but therapy is a good outlet for patient process feelings to help with coping  Interventions: Solution Focused, Strength-based, Supportive, and Other: Coping  Summary: [name] is a 61 y.o. female who presents with hurting today her knees and her back kept her up all night not feeling perky. Just have bad days and nights. They haven't changed her meds lately. Haven't done anything weird maybe the weather and the barometric pressure. Doesn't sleep that long wakes up after 30 minutes or 45 minutes.Tired all the time. Never rested enough and that makes you feel bad. Biological clocks messed up "vampire hours for past couple of weeks". Up all night and be sleeping the day.  Talked about sessions being a positive and [name] as well upbeat. Talked about husband who was marine and some of his abilities. Patient knows how to [place]   Therapist assess processing feelings talking about different subjects helpful for distraction and mood.  Patient describes having a difficult night so assess helpful to have sessions as she has ongoing pain issues, as she describes in this session problems with sleep because of pain.  Patient herself describes sessions as positive which therapist assesses has a therapeutic value.  Talked about different subjects including husband's history with readings and police interesting for patient to share more of her history again assess helpful for patient to share more about herself has therapeutic value.  Therapist provided space and support for patient to talk about thoughts and feelings in session.  Suicidal/Homicidal: No  Plan: Return again in 2 weeks.2.Utilize session for processing of thoughts and feelings, stress management, mood enhancement  Diagnosis: major depressive disorder, recurrent, moderate, degenerative disc disease, chronic pain syndrome, generalized anxiety disorder  Collaboration of Care: Other none needed  Patient/Guardian was advised Release of Information must be obtained prior to any record release in order to collaborate their care with an outside provider. Patient/Guardian was advised if they have not already done so to contact the registration department to sign all necessary forms in order for [place] to release information regarding their care.   Consent: Patient/Guardian gives verbal consent for treatment and assignment of benefits for services provided during this visit. Patient/Guardian expressed understanding and agreed to proceed.   Frances Register, LCSW 08/22/2021

## 2021-08-24 ENCOUNTER — Encounter: Payer: Self-pay | Admitting: Registered Nurse

## 2021-08-24 ENCOUNTER — Encounter: Payer: Medicare Other | Attending: Physical Medicine and Rehabilitation | Admitting: Registered Nurse

## 2021-08-24 VITALS — BP 132/81 | HR 84 | Ht 68.0 in | Wt 223.0 lb

## 2021-08-24 DIAGNOSIS — G9050 Complex regional pain syndrome I, unspecified: Secondary | ICD-10-CM

## 2021-08-24 DIAGNOSIS — M5136 Other intervertebral disc degeneration, lumbar region: Secondary | ICD-10-CM | POA: Diagnosis not present

## 2021-08-24 DIAGNOSIS — M47816 Spondylosis without myelopathy or radiculopathy, lumbar region: Secondary | ICD-10-CM | POA: Diagnosis not present

## 2021-08-24 DIAGNOSIS — G8929 Other chronic pain: Secondary | ICD-10-CM

## 2021-08-24 DIAGNOSIS — G894 Chronic pain syndrome: Secondary | ICD-10-CM

## 2021-08-24 DIAGNOSIS — Z79899 Other long term (current) drug therapy: Secondary | ICD-10-CM | POA: Diagnosis not present

## 2021-08-24 DIAGNOSIS — Z5181 Encounter for therapeutic drug level monitoring: Secondary | ICD-10-CM

## 2021-08-24 DIAGNOSIS — M546 Pain in thoracic spine: Secondary | ICD-10-CM

## 2021-08-24 DIAGNOSIS — M1712 Unilateral primary osteoarthritis, left knee: Secondary | ICD-10-CM | POA: Diagnosis not present

## 2021-08-24 DIAGNOSIS — M542 Cervicalgia: Secondary | ICD-10-CM | POA: Diagnosis not present

## 2021-08-24 DIAGNOSIS — M1711 Unilateral primary osteoarthritis, right knee: Secondary | ICD-10-CM

## 2021-08-24 MED ORDER — MORPHINE SULFATE 15 MG PO TABS: 15.0000 mg | ORAL_TABLET | Freq: Four times a day (QID) | ORAL | 0 refills | Status: DC | PRN

## 2021-08-24 MED ORDER — MORPHINE SULFATE ER 30 MG PO TBCR: 30.0000 mg | EXTENDED_RELEASE_TABLET | Freq: Two times a day (BID) | ORAL | 0 refills | Status: DC

## 2021-08-24 MED ORDER — MORPHINE SULFATE ER 60 MG PO TBCR: 60.0000 mg | EXTENDED_RELEASE_TABLET | Freq: Two times a day (BID) | ORAL | 0 refills | Status: DC

## 2021-08-24 NOTE — Progress Notes (Signed)
Subjective:    Patient ID: [name], female    DOB: 07/27/1960, 61 y.o.   MRN: [id number]  HPI: [name] is a 61 y.o. female who returns for follow up appointment for chronic pain and medication refill. She states her pain is located in her neck, mid- lower back pain radiating into her bilateral hips and bilateral knee pain R>L. She also reports she was washing her hair, she heard popping sound and has increase intensity of neck pain. X-ray ordered, she verbalizes understanding.   She rates her pain 8. Her current exercise regime is walking, chair exercises and performing stretching exercises.  [name] Morphine equivalent is 242.00 MME.   Last UDS was Performed on 05/18/2021, it was consistent.      Pain Inventory Average Pain 9 Pain Right Now 8 My pain is constant, sharp, burning, dull, stabbing, tingling, and aching  In the last 24 hours, has pain interfered with the following? General activity 10 Relation with others 10 Enjoyment of life 10 What TIME of day is your pain at its worst? morning , daytime, evening, and night Sleep (in general) Poor  Pain is worse with: walking, bending, standing, and some activites Pain improves with: rest, heat/ice, therapy/exercise, medication, TENS, and injections Relief from Meds: 6      Family History  Problem Relation Age of Onset   Depression Mother    Hypertension Mother    Hypertension Father    Heart failure Father    Diabetes Father    Heart attack Father    Colon cancer Neg Hx    Esophageal cancer Neg Hx    Stomach cancer Neg Hx    Rectal cancer Neg Hx    Social History   Socioeconomic History   Marital status: Married    Spouse name: [name]   Number of children: 1   Years of education: some college   Highest education level: 12th grade  Occupational History   Occupation: On disability    Employer: UNEMPLOYED  Tobacco Use   Smoking status: Former    Packs/day: 0.50    Types:  Cigarettes    Quit date: 02/06/2016    Years since quitting: 5.5   Smokeless tobacco: Never  Vaping Use   Vaping Use: Never used  Substance and Sexual Activity   Alcohol use: No    Alcohol/week: 0.0 standard drinks of alcohol   Drug use: No   Sexual activity: Not Currently    Partners: Male  Other Topics Concern   Not on file  Social History Narrative   On disability. Lives with her husband. She has a adopted daughter. She likes to read in her free time. She does chair exercises for 30-45 mins everyday.   Social Determinants of Health   Financial Resource Strain: Low Risk  (04/30/2020)   Overall Financial Resource Strain (CARDIA)    Difficulty of Paying Living Expenses: Not hard at all  Food Insecurity: No Food Insecurity (04/30/2020)   Hunger Vital Sign    Worried About Running Out of Food in the Last Year: Never true    Ran Out of Food in the Last Year: Never true  Transportation Needs: No Transportation Needs (04/30/2020)   PRAPARE - Hydrologist (Medical): No    Lack of Transportation (Non-Medical): No  Physical Activity: Inactive (04/30/2020)   Exercise Vital Sign    Days of Exercise per Week: 0 days    Minutes of Exercise per Session:  0 min  Stress: No Stress Concern Present (04/30/2020)   [place]    Feeling of Stress : Not at all  Social Connections: Socially Isolated (04/30/2020)   Social Connection and Isolation Panel [NHANES]    Frequency of Communication with Friends and Family: Never    Frequency of Social Gatherings with Friends and Family: Once a week    Attends Religious Services: Never    Marine scientist or Organizations: No    Attends Archivist Meetings: Never    Marital Status: Married   Past Surgical History:  Procedure Laterality Date   ABDOMINAL HYSTERECTOMY  May 2004   [place] Cell Carcinoma  06/2005, 07/2005   nasal tip and reconstruction    BREAST CYST ASPIRATION     CARPAL TUNNEL RELEASE  07/1993   both hands   COLONOSCOPY     HEMORROIDECTOMY  July 2003   KNEE ARTHROPLASTY  09/2001, 05/2003   left knee, chondromalasia   KNEE ARTHROSCOPY  jan 2005   Right knee, tisse release, chrondromalacia   REPLACEMENT TOTAL KNEE  Nov 2005   Left    THYROID LOBECTOMY  01/2005   malignant area removed from right lobe   THYROID SURGERY     thyroid lobe removal   TONSILLECTOMY  09/1972   TUBAL LIGATION  Sept 1999   Ulnar nerve entrapment  Feb 1995   left elbow   Past Medical History:  Diagnosis Date   Allergy    Anxiety    Cancer ([place])    cancerous nodules on thyroid   Cancer ([place])    basal cell Cancer-nose   Chronic pain syndrome    COPD (chronic obstructive pulmonary disease) ([place])    Degeneration of lumbar or lumbosacral intervertebral disc    Depression    Facet syndrome, lumbar    GERD (gastroesophageal reflux disease)    Hyperlipidemia    Intervertebral lumbar disc disorder with myelopathy, lumbar region    Lumbosacral spondylosis without myelopathy    Migraine without aura, with intractable migraine, so stated, without mention of status migrainosus    Primary localized osteoarthrosis, lower leg    Thyroid disease    hypothyroid   Unspecified musculoskeletal disorders and symptoms referable to neck    cervical/trapezius   BP 132/81   Pulse 84   Ht '5\' 8"'$  (1.727 m)   Wt 223 lb (101.2 kg)   SpO2 96%   BMI 33.91 kg/m   Opioid Risk Score:   Fall Risk Score:  `1  Depression screen [facility] 2/9     07/27/2021   12:43 PM 06/29/2021   10:37 AM 03/23/2021   10:25 AM 02/23/2021    9:52 AM 01/24/2021    8:28 AM 12/23/2020   10:54 AM 12/23/2020   10:47 AM  Depression screen PHQ 2/9  Decreased Interest '3 2 1 1 1 '$ 0 0  Down, Depressed, Hopeless '3 2 1 1 1 3 '$ 0  PHQ - 2 Score '6 4 2 2 2 3 '$ 0    Review of Systems  Musculoskeletal:  Positive for arthralgias, back pain, gait problem and neck pain.  All other systems reviewed  and are negative.      Objective:   Physical Exam Vitals and nursing note reviewed.  Constitutional:      Appearance: Normal appearance.  Cardiovascular:     Rate and Rhythm: Normal rate and regular rhythm.     Pulses: Normal pulses.  Heart sounds: Normal heart sounds.  Pulmonary:     Effort: Pulmonary effort is normal.     Breath sounds: Normal breath sounds.  Musculoskeletal:     Cervical back: Normal range of motion and neck supple.     Comments: Normal Muscle Bulk and Muscle Testing Reveals:  Upper Extremities: Full ROM and Muscle Strength 5/5  Thoracic Paraspinal Tenderness: T-1-T-3 Mainly Right Side  Lumbar Paraspinal Tenderness: L--L-5 Bilateral Greater Trochanter Tenderness Lower Extremities: Decreased ROM and Muscle Strength 4/5 Bilateral Lower Extremities Flexion Produces Pain into her Bilateral Patellas R>L Arises from Table Slowly using cane for support Antalgic  Gait     Skin:    General: Skin is warm and dry.  Neurological:     Mental Status: She is alert and oriented to person, place, and time.  Psychiatric:        Mood and Affect: Mood normal.        Behavior: Behavior normal.         Assessment & Plan:  1.Chronic Bilateral Thoracic Back Pain/ Low Back Pain/ Lumbar Facet Arthropathy: Refilled MSIR 15 mg one tablet every 6 hours as needed #100 and Continue with Slow weaning of Morphine.   Refilled: MS Contin 60 mg and 30 mg Q 12 hours to equal   90 mg  #60.  Continue Gabapentin and Pamelor. 08/24/2021 We will continue the opioid monitoring program, this consists of regular clinic visits, examinations, urine drug screen, pill counts as well as use of [place] Controlled Substance Reporting system. A 12 month History has been reviewed on the [place] on 08/24/2021. 2. Degenerative Disk Disease:Encouraged Continue HEP as Tolertated  and heat therapy. Continue Current Medication Regime.08/24/2021 3. Bilateral  Greater Trochanteric Bursitis:  Continue current treatment regimen with Heat and Ice Therapy. 08/24/2021 4. Osteoarthritis of Bilateral Knees: R>L Continue current treatment modality with home exercise program and heat therapy. 08/24/2021 5. Migraine Headaches: Continue current medication regimen  Continue with  Migraine Journal. Aimovig discontinued due to Hives. Continue Lamictal and Relpax. Continue to Monitor.  08/24/2021 6. Smoking Cessation: She has quit smoking since 08/25/2016. Continue to Monitor. 08/24/2021 7.Constipation: Continue  Current medication regime with Linzess. 08/24/2021 8. Muscle Spasm: Continue current medication regime with Tizanidine.08/24/2021 9. DepressionAnxiety/ Panic Attacks: Continue current medication regimen and treatment modality.  Celexa.Lamictal  and  Counseling with [name] and  Dr. De Nurse Psychiatrist. 08/24/2021 10. RSD: Continue with current medication Regime with Gabapentin. 08/24/2021   F/U in 1 month

## 2021-08-25 DIAGNOSIS — R6521 Severe sepsis with septic shock: Secondary | ICD-10-CM | POA: Diagnosis not present

## 2021-08-25 DIAGNOSIS — E039 Hypothyroidism, unspecified: Secondary | ICD-10-CM | POA: Diagnosis not present

## 2021-08-25 DIAGNOSIS — G9341 Metabolic encephalopathy: Secondary | ICD-10-CM | POA: Diagnosis not present

## 2021-08-25 DIAGNOSIS — Z7984 Long term (current) use of oral hypoglycemic drugs: Secondary | ICD-10-CM | POA: Diagnosis not present

## 2021-08-25 DIAGNOSIS — Z87891 Personal history of nicotine dependence: Secondary | ICD-10-CM | POA: Diagnosis not present

## 2021-08-25 DIAGNOSIS — Z888 Allergy status to other drugs, medicaments and biological substances status: Secondary | ICD-10-CM | POA: Diagnosis not present

## 2021-08-25 DIAGNOSIS — R509 Fever, unspecified: Secondary | ICD-10-CM | POA: Diagnosis not present

## 2021-08-25 DIAGNOSIS — R6889 Other general symptoms and signs: Secondary | ICD-10-CM | POA: Diagnosis not present

## 2021-08-25 DIAGNOSIS — Z79891 Long term (current) use of opiate analgesic: Secondary | ICD-10-CM | POA: Diagnosis not present

## 2021-08-25 DIAGNOSIS — Z743 Need for continuous supervision: Secondary | ICD-10-CM | POA: Diagnosis not present

## 2021-08-25 DIAGNOSIS — R4020 Unspecified coma: Secondary | ICD-10-CM | POA: Diagnosis not present

## 2021-08-25 DIAGNOSIS — R739 Hyperglycemia, unspecified: Secondary | ICD-10-CM | POA: Diagnosis not present

## 2021-08-25 DIAGNOSIS — A419 Sepsis, unspecified organism: Secondary | ICD-10-CM | POA: Diagnosis not present

## 2021-08-25 DIAGNOSIS — G43909 Migraine, unspecified, not intractable, without status migrainosus: Secondary | ICD-10-CM | POA: Diagnosis not present

## 2021-08-25 DIAGNOSIS — R404 Transient alteration of awareness: Secondary | ICD-10-CM | POA: Diagnosis not present

## 2021-08-25 DIAGNOSIS — I517 Cardiomegaly: Secondary | ICD-10-CM | POA: Diagnosis not present

## 2021-08-25 DIAGNOSIS — R0689 Other abnormalities of breathing: Secondary | ICD-10-CM | POA: Diagnosis not present

## 2021-08-25 DIAGNOSIS — E119 Type 2 diabetes mellitus without complications: Secondary | ICD-10-CM | POA: Diagnosis not present

## 2021-08-25 DIAGNOSIS — Z79899 Other long term (current) drug therapy: Secondary | ICD-10-CM | POA: Diagnosis not present

## 2021-08-25 DIAGNOSIS — E871 Hypo-osmolality and hyponatremia: Secondary | ICD-10-CM | POA: Diagnosis not present

## 2021-08-25 DIAGNOSIS — Z886 Allergy status to analgesic agent status: Secondary | ICD-10-CM | POA: Diagnosis not present

## 2021-08-25 DIAGNOSIS — N3000 Acute cystitis without hematuria: Secondary | ICD-10-CM | POA: Diagnosis not present

## 2021-08-25 DIAGNOSIS — F32A Depression, unspecified: Secondary | ICD-10-CM | POA: Diagnosis not present

## 2021-08-25 DIAGNOSIS — R410 Disorientation, unspecified: Secondary | ICD-10-CM | POA: Diagnosis not present

## 2021-08-25 DIAGNOSIS — I272 Pulmonary hypertension, unspecified: Secondary | ICD-10-CM | POA: Diagnosis not present

## 2021-08-25 DIAGNOSIS — Z96652 Presence of left artificial knee joint: Secondary | ICD-10-CM | POA: Diagnosis not present

## 2021-08-25 LAB — TSH: TSH: 1.23 (ref 0.41–5.90)

## 2021-08-26 LAB — HEMOGLOBIN A1C: Hemoglobin A1C: 6.8

## 2021-08-28 ENCOUNTER — Other Ambulatory Visit: Payer: Self-pay | Admitting: Registered Nurse

## 2021-09-01 ENCOUNTER — Other Ambulatory Visit: Payer: Self-pay | Admitting: *Deleted

## 2021-09-01 NOTE — Patient Outreach (Signed)
  Care Coordination [facility] Note Transition Care Management Follow-up Telephone Call Date of discharge and from where: 08/27/21 [facility] How have you been since you were released from the hospital? Patient states her back is hurting but otherwise she is doing well  Any questions or concerns? No  Items Reviewed: Did the pt receive and understand the discharge instructions provided? Yes  Medications obtained and verified? Yes  Other? No  Any new allergies since your discharge? No  Dietary orders reviewed? Yes Do you have support at home? Yes   Home Care and Equipment/Supplies: Were home health services ordered? no If so, what is the name of the agency? N/A  Has the agency set up a time to come to the patient's home? not applicable Were any new equipment or medical supplies ordered?  No What is the name of the medical supply agency? N/A Were you able to get the supplies/equipment? N/A Do you have any questions related to the use of the equipment or supplies? No  Functional Questionnaire: (I = Independent and D = Dependent) ADLs: I  Bathing/Dressing- I  Meal Prep- I  Eating- I  Maintaining continence- I  Transferring/Ambulation- I  Managing Meds- I  Follow up appointments reviewed:  PCP Hospital f/u appt confirmed? Yes  Scheduled to see Dr. Madilyn Fireman on 09/12/21 @ 1050. [facility] f/u appt confirmed? No   Are transportation arrangements needed? No  If their condition worsens, is the pt aware to call PCP or go to the Emergency Dept.? Yes Was the patient provided with contact information for the PCP's office or ED? Yes Was to pt encouraged to call back with questions or concerns? Yes  SDOH assessments and interventions completed:   Yes  Care Coordination Interventions Activated:  No Care Coordination Interventions:   N/A  Encounter Outcome:  Pt. Visit Completed  Emelia Loron RN, BSN [facility] [phone] [email or web address]

## 2021-09-05 ENCOUNTER — Ambulatory Visit (HOSPITAL_COMMUNITY): Payer: Medicare Other | Admitting: Licensed Clinical Social Worker

## 2021-09-12 ENCOUNTER — Ambulatory Visit: Payer: Medicare Other | Admitting: Family Medicine

## 2021-09-14 ENCOUNTER — Other Ambulatory Visit: Payer: Self-pay | Admitting: Physical Medicine & Rehabilitation

## 2021-09-14 DIAGNOSIS — G43119 Migraine with aura, intractable, without status migrainosus: Secondary | ICD-10-CM

## 2021-09-16 ENCOUNTER — Other Ambulatory Visit: Payer: Self-pay | Admitting: Physical Medicine & Rehabilitation

## 2021-09-20 ENCOUNTER — Ambulatory Visit (INDEPENDENT_AMBULATORY_CARE_PROVIDER_SITE_OTHER): Payer: Medicare Other | Admitting: Licensed Clinical Social Worker

## 2021-09-20 DIAGNOSIS — G894 Chronic pain syndrome: Secondary | ICD-10-CM | POA: Diagnosis not present

## 2021-09-20 DIAGNOSIS — F411 Generalized anxiety disorder: Secondary | ICD-10-CM

## 2021-09-20 DIAGNOSIS — F331 Major depressive disorder, recurrent, moderate: Secondary | ICD-10-CM | POA: Diagnosis not present

## 2021-09-20 DIAGNOSIS — M5136 Other intervertebral disc degeneration, lumbar region: Secondary | ICD-10-CM

## 2021-09-20 NOTE — Progress Notes (Signed)
Virtual Visit via Video Note  I connected with [name] on 09/20/21 at  8:00 AM EDT by a video enabled telemedicine application and verified that I am speaking with the correct person using two identifiers.  Location: Patient: home Provider: office   I discussed the limitations of evaluation and management by telemedicine and the availability of in person appointments. The patient expressed understanding and agreed to proceed.   I discussed the assessment and treatment plan with the patient. The patient was provided an opportunity to ask questions and all were answered. The patient agreed with the plan and demonstrated an understanding of the instructions.   The patient was advised to call back or seek an in-person evaluation if the symptoms worsen or if the condition fails to improve as anticipated.  I provided 50 minutes of non-face-to-face time during this encounter.   THERAPIST PROGRESS NOTE  Session Time: 8:00 AM to 8:50 AM  Participation Level: Active  Behavioral Response: CasualAlertnoted euthymic in session but dysphoric related to migraine  Type of Therapy: Individual Therapy  Treatment Goals addressed: Decrease in depression, anxiety, stress management, coping  ProgressTowards Goals: Progressing-therapist assesses therapy helpful treatment intervention for patient's mood issues distraction and processing feelings helpful  Interventions: Solution Focused, Strength-based, Supportive, and Other: coping  Summary: [name] is a 61 y.o. female who presents with talked about different subjects that were interesting to patient to help mood. She watches shows about [place], [name]. Patient would love to live in [place]. They have gardens and patient was a Psychiatric nurse for 16 years. Fishing on the rivers. They have huge rivers that freeze. [name] lay in bed with feet on her side. Eyes twitch and paw moving guess he is dreaming. Have a migraine  for 8 days. Haven't slept in two nights. If move around will get nauseous and vomit don't want to do that have crackers nearby. Comforting to have [name] knows when patient not feeling good. They are tune in with their owners. Talked about impressive skills of  dogs also humorous like getting a beer from the refrigerator, patient shared memory of 1 a [name] dogs who went to the store and had to carry a bag like he did. Talked about [name]-[name] thinks the world of her and [name] loves the dog.     Patient has migraines so assess helpful to talk about various subjects that are interesting to her and amusing for distraction purposes also to lighten mood.  Impressed with some of patient's interest such as [place] and [name].  Talked about coping and helpfulness of her dog who is there to comfort her nose when she is not feeling well therapist noted from her own experience having an animal close by can be very comforting.  Therapist in general provided space and support for patient to talk about thoughts and feelings in session. Suicidal/Homicidal: No  Plan: Return again in 2 weeks.2.Utilize session for processing of thoughts and feelings, stress management, mood enhancement  Diagnosis: major depressive disorder, recurrent, moderate, degenerative disc disease, chronic pain syndrome, generalized anxiety disorder  Collaboration of Care: Other needed  Patient/Guardian was advised Release of Information must be obtained prior to any record release in order to collaborate their care with an outside provider. Patient/Guardian was advised if they have not already done so to contact the registration department to sign all necessary forms in order for [place] to release information regarding their care.   Consent: Patient/Guardian gives verbal consent for treatment and  assignment of benefits for services provided during this visit. Patient/Guardian expressed understanding and agreed to proceed.    [name], LCSW 09/20/2021

## 2021-09-21 ENCOUNTER — Encounter: Payer: Medicare Other | Attending: Physical Medicine and Rehabilitation | Admitting: Registered Nurse

## 2021-09-21 ENCOUNTER — Encounter: Payer: Self-pay | Admitting: Registered Nurse

## 2021-09-21 VITALS — BP 124/76 | HR 106 | Ht 67.0 in | Wt 228.0 lb

## 2021-09-21 DIAGNOSIS — Z79899 Other long term (current) drug therapy: Secondary | ICD-10-CM

## 2021-09-21 DIAGNOSIS — M546 Pain in thoracic spine: Secondary | ICD-10-CM | POA: Insufficient documentation

## 2021-09-21 DIAGNOSIS — G8929 Other chronic pain: Secondary | ICD-10-CM | POA: Diagnosis not present

## 2021-09-21 DIAGNOSIS — M47816 Spondylosis without myelopathy or radiculopathy, lumbar region: Secondary | ICD-10-CM | POA: Diagnosis not present

## 2021-09-21 DIAGNOSIS — M542 Cervicalgia: Secondary | ICD-10-CM

## 2021-09-21 DIAGNOSIS — G43119 Migraine with aura, intractable, without status migrainosus: Secondary | ICD-10-CM | POA: Diagnosis not present

## 2021-09-21 DIAGNOSIS — G894 Chronic pain syndrome: Secondary | ICD-10-CM

## 2021-09-21 DIAGNOSIS — M1712 Unilateral primary osteoarthritis, left knee: Secondary | ICD-10-CM | POA: Diagnosis not present

## 2021-09-21 DIAGNOSIS — M1711 Unilateral primary osteoarthritis, right knee: Secondary | ICD-10-CM | POA: Diagnosis not present

## 2021-09-21 DIAGNOSIS — G9050 Complex regional pain syndrome I, unspecified: Secondary | ICD-10-CM | POA: Diagnosis not present

## 2021-09-21 DIAGNOSIS — Z5181 Encounter for therapeutic drug level monitoring: Secondary | ICD-10-CM

## 2021-09-21 MED ORDER — MORPHINE SULFATE ER 30 MG PO TBCR: 30.0000 mg | EXTENDED_RELEASE_TABLET | Freq: Two times a day (BID) | ORAL | 0 refills | Status: DC

## 2021-09-21 MED ORDER — GABAPENTIN 800 MG PO TABS: 800.0000 mg | ORAL_TABLET | Freq: Four times a day (QID) | ORAL | 5 refills | Status: DC

## 2021-09-21 MED ORDER — MORPHINE SULFATE 15 MG PO TABS: 15.0000 mg | ORAL_TABLET | Freq: Four times a day (QID) | ORAL | 0 refills | Status: DC | PRN

## 2021-09-21 MED ORDER — MORPHINE SULFATE ER 60 MG PO TBCR: 60.0000 mg | EXTENDED_RELEASE_TABLET | Freq: Two times a day (BID) | ORAL | 0 refills | Status: DC

## 2021-09-21 NOTE — Progress Notes (Signed)
Subjective:    Patient ID: [name], female    DOB: 1960/12/12, 61 y.o.   MRN: [id number]  HPI: [name] is a 61 y.o. female who returns for follow up appointment for chronic pain and medication refill. She states her pain is located in her neck, mid- back mainly left side, lower back pain, sacral pain and bilateral knee pain R>L. She rates her pain 8. Her current exercise regime is walking and performing stretching exercises.  [name] was admitted to [facility] for Severe Sepsis and Altered Mental Health, note reviewed.   [name] Morphine equivalent is 242.00 MME.   UDS ordered today.      Pain Inventory Average Pain 8 Pain Right Now 8 My pain is constant, sharp, burning, dull, stabbing, tingling, and aching  In the last 24 hours, has pain interfered with the following? General activity 10 Relation with others 10 Enjoyment of life 10 What TIME of day is your pain at its worst? morning , daytime, evening, and night Sleep (in general) Poor  Pain is worse with: walking, bending, standing, and some activites Pain improves with: rest, heat/ice, medication, TENS, injections, and therapy Relief from Meds: 6  Family History  Problem Relation Age of Onset   Depression Mother    Hypertension Mother    Hypertension Father    Heart failure Father    Diabetes Father    Heart attack Father    Colon cancer Neg Hx    Esophageal cancer Neg Hx    Stomach cancer Neg Hx    Rectal cancer Neg Hx    Social History   Socioeconomic History   Marital status: Married    Spouse name: [name]   Number of children: 1   Years of education: some college   Highest education level: 12th grade  Occupational History   Occupation: On disability    Employer: UNEMPLOYED  Tobacco Use   Smoking status: Former    Packs/day: 0.50    Types: Cigarettes    Quit date: 02/06/2016    Years since quitting: 5.6   Smokeless tobacco: Never  Vaping Use   Vaping Use:  Never used  Substance and Sexual Activity   Alcohol use: No    Alcohol/week: 0.0 standard drinks of alcohol   Drug use: No   Sexual activity: Not Currently    Partners: Male  Other Topics Concern   Not on file  Social History Narrative   On disability. Lives with her husband. She has a adopted daughter. She likes to read in her free time. She does chair exercises for 30-45 mins everyday.   Social Determinants of Health   Financial Resource Strain: Low Risk  (04/30/2020)   Overall Financial Resource Strain (CARDIA)    Difficulty of Paying Living Expenses: Not hard at all  Food Insecurity: No Food Insecurity (04/30/2020)   Hunger Vital Sign    Worried About Running Out of Food in the Last Year: Never true    Ran Out of Food in the Last Year: Never true  Transportation Needs: No Transportation Needs (04/30/2020)   PRAPARE - Hydrologist (Medical): No    Lack of Transportation (Non-Medical): No  Physical Activity: Inactive (04/30/2020)   Exercise Vital Sign    Days of Exercise per Week: 0 days    Minutes of Exercise per Session: 0 min  Stress: No Stress Concern Present (04/30/2020)   [place]  Questionnaire    Feeling of Stress : Not at all  Social Connections: Socially Isolated (04/30/2020)   Social Connection and Isolation Panel [NHANES]    Frequency of Communication with Friends and Family: Never    Frequency of Social Gatherings with Friends and Family: Once a week    Attends Religious Services: Never    Marine scientist or Organizations: No    Attends Archivist Meetings: Never    Marital Status: Married   Past Surgical History:  Procedure Laterality Date   ABDOMINAL HYSTERECTOMY  May 2004   [place] Cell Carcinoma  06/2005, 07/2005   nasal tip and reconstruction   BREAST CYST ASPIRATION     CARPAL TUNNEL RELEASE  07/1993   both hands   COLONOSCOPY     HEMORROIDECTOMY  July 2003    KNEE ARTHROPLASTY  09/2001, 05/2003   left knee, chondromalasia   KNEE ARTHROSCOPY  jan 2005   Right knee, tisse release, chrondromalacia   REPLACEMENT TOTAL KNEE  Nov 2005   Left    THYROID LOBECTOMY  01/2005   malignant area removed from right lobe   THYROID SURGERY     thyroid lobe removal   TONSILLECTOMY  09/1972   TUBAL LIGATION  Sept 1999   Ulnar nerve entrapment  Feb 1995   left elbow   Past Surgical History:  Procedure Laterality Date   ABDOMINAL HYSTERECTOMY  May 2004   [place] Cell Carcinoma  06/2005, 07/2005   nasal tip and reconstruction   BREAST CYST ASPIRATION     CARPAL TUNNEL RELEASE  07/1993   both hands   COLONOSCOPY     HEMORROIDECTOMY  July 2003   KNEE ARTHROPLASTY  09/2001, 05/2003   left knee, chondromalasia   KNEE ARTHROSCOPY  jan 2005   Right knee, tisse release, chrondromalacia   REPLACEMENT TOTAL KNEE  Nov 2005   Left    THYROID LOBECTOMY  01/2005   malignant area removed from right lobe   THYROID SURGERY     thyroid lobe removal   TONSILLECTOMY  09/1972   TUBAL LIGATION  Sept 1999   Ulnar nerve entrapment  Feb 1995   left elbow   Past Medical History:  Diagnosis Date   Allergy    Anxiety    Cancer ([name])    cancerous nodules on thyroid   Cancer ([place])    basal cell Cancer-nose   Chronic pain syndrome    COPD (chronic obstructive pulmonary disease) ([place])    Degeneration of lumbar or lumbosacral intervertebral disc    Depression    Facet syndrome, lumbar    GERD (gastroesophageal reflux disease)    Hyperlipidemia    Intervertebral lumbar disc disorder with myelopathy, lumbar region    Lumbosacral spondylosis without myelopathy    Migraine without aura, with intractable migraine, so stated, without mention of status migrainosus    Primary localized osteoarthrosis, lower leg    Thyroid disease    hypothyroid   Unspecified musculoskeletal disorders and symptoms referable to neck    cervical/trapezius   There were no vitals taken for this  visit.  Opioid Risk Score:   Fall Risk Score:  `1  Depression screen [facility] 2/9     08/24/2021    9:06 AM 07/27/2021   12:43 PM 06/29/2021   10:37 AM 03/23/2021   10:25 AM 02/23/2021    9:52 AM 01/24/2021    8:28 AM 12/23/2020   10:54 AM  Depression screen PHQ 2/9  Decreased Interest 0  $'3 2 1 1 1 'C$ 0  Down, Depressed, Hopeless 0 '3 2 1 1 1 3  '$ PHQ - 2 Score 0 '6 4 2 2 2 3    '$ Review of Systems  Musculoskeletal:  Positive for arthralgias, back pain, gait problem and neck pain.       Right knee pain & pain in both feet  All other systems reviewed and are negative.      Objective:   Physical Exam Vitals and nursing note reviewed.  Constitutional:      Appearance: Normal appearance.  Cardiovascular:     Rate and Rhythm: Normal rate and regular rhythm.     Pulses: Normal pulses.     Heart sounds: Normal heart sounds.  Pulmonary:     Effort: Pulmonary effort is normal.     Breath sounds: Normal breath sounds.  Musculoskeletal:     Cervical back: Normal range of motion and neck supple.     Comments: Normal Muscle Bulk and Muscle Testing Reveals:  Upper Extremities: Full ROM and Muscle Strength 5/5  Thoracic Paraspinal Tenderness: T-7-T-9 Mainly Left Side Lumbar Paraspinal Tenderness: L-3-L-5 Sacral Tenderness Lower Extremities: Full ROM and Muscle Strength 5/5 Arises from Table slowly using cane for support Antalgic  Gait     Skin:    General: Skin is warm and dry.  Neurological:     Mental Status: She is alert and oriented to person, place, and time.  Psychiatric:        Mood and Affect: Mood normal.        Behavior: Behavior normal.         Assessment & Plan:  1.Chronic Bilateral Thoracic Back Pain/ Low Back Pain/ Lumbar Facet Arthropathy: Refilled MSIR 15 mg one tablet every 6 hours as needed #100 and Continue with Slow weaning of Morphine.   Refilled: MS Contin 60 mg and 30 mg Q 12 hours to equal   90 mg  #60.  Second script sent for the following month.  Continue  Gabapentin and Pamelor. 09/21/2021 We will continue the opioid monitoring program, this consists of regular clinic visits, examinations, urine drug screen, pill counts as well as use of [place] Controlled Substance Reporting system. A 12 month History has been reviewed on the [place] on 09/21/2021. 2. Degenerative Disk Disease:Encouraged Continue HEP as Tolertated  and heat therapy. Continue Current Medication Regime.09/21/2021 3. Bilateral Greater Trochanteric Bursitis:  Continue current treatment regimen with Heat and Ice Therapy. 09/21/2021 4. Osteoarthritis of Bilateral Knees: R>L Continue current treatment modality with home exercise program and heat therapy. 09/21/2021 5. Migraine Headaches: Continue current medication regimen  Continue with  Migraine Journal. Aimovig discontinued due to Hives. Continue Lamictal and Relpax. Continue to Monitor.  09/21/2021 6. Smoking Cessation: She has quit smoking since 08/25/2016. Continue to Monitor. 09/21/2021 7.Constipation: Continue  Current medication regime with Linzess. 09/21/2021 8. Muscle Spasm: Continue current medication regime with Tizanidine.09/21/2021 9. DepressionAnxiety/ Panic Attacks: Continue current medication regimen and treatment modality.  Celexa.Lamictal  and  Counseling with [name] and  Dr. De Nurse Psychiatrist. 09/21/2021 10. RSD: Continue with current medication Regime with Gabapentin. 09/21/2021   F/U in 1 month

## 2021-09-24 LAB — TOXASSURE SELECT,+ANTIDEPR,UR

## 2021-09-26 ENCOUNTER — Telehealth: Payer: Self-pay | Admitting: *Deleted

## 2021-09-26 NOTE — Telephone Encounter (Signed)
Urine drug screen for this encounter is consistent for prescribed medication 

## 2021-09-28 ENCOUNTER — Encounter: Payer: Self-pay | Admitting: General Practice

## 2021-10-03 ENCOUNTER — Ambulatory Visit (INDEPENDENT_AMBULATORY_CARE_PROVIDER_SITE_OTHER): Payer: Medicare Other | Admitting: Licensed Clinical Social Worker

## 2021-10-03 DIAGNOSIS — F331 Major depressive disorder, recurrent, moderate: Secondary | ICD-10-CM

## 2021-10-03 DIAGNOSIS — G894 Chronic pain syndrome: Secondary | ICD-10-CM

## 2021-10-03 DIAGNOSIS — M5136 Other intervertebral disc degeneration, lumbar region: Secondary | ICD-10-CM

## 2021-10-03 DIAGNOSIS — F411 Generalized anxiety disorder: Secondary | ICD-10-CM

## 2021-10-03 NOTE — Progress Notes (Signed)
Virtual Visit via Video Note  I connected with [name] on 10/03/21 at  8:00 AM EDT by a video enabled telemedicine application and verified that I am speaking with the correct person using two identifiers.  Location: Patient: home Provider: office   I discussed the limitations of evaluation and management by telemedicine and the availability of in person appointments. The patient expressed understanding and agreed to proceed.    I discussed the assessment and treatment plan with the patient. The patient was provided an opportunity to ask questions and all were answered. The patient agreed with the plan and demonstrated an understanding of the instructions.   The patient was advised to call back or seek an in-person evaluation if the symptoms worsen or if the condition fails to improve as anticipated.  I provided 52 minutes of non-face-to-face time during this encounter.  THERAPIST PROGRESS NOTE  Session Time: 8:00 AM to 8:52 AM  Participation Level: Active  Behavioral Response: CasualAlertDysphoric  Type of Therapy: Individual Therapy  Treatment Goals addressed:  Decrease in depression, anxiety, stress management, coping  ProgressTowards Goals: Progressing-useful for patient to utilize therapy to help her process through feelings to help her with stressors  Interventions: Solution Focused, Strength-based, Supportive, and Other: Coping  Summary: FATHIMA BARTL is a 61 y.o. female who presents with talked about visits to pain doctor. Nurse practitioner goes over the same thing that nurse goes over. She checks how flexible she is bending and straightening her knee. This coming next visit cut down 90 ml to 75 ml. Reason that much of a jump no other way to make the milliliters on extended release. Still have immediate release they don't do much for her. Take them so long building a tolerance instead of going up going down. Already spend 4 hours with knees up and ice  packs. Cutting back with national crisis with opiates. Not thinking about people like her and allergic to other medications. They keep saying that as go lower the body will make the chemicals itself. That is not happening patient says with her. Already spending four hours a day trapped. Asks is she going to be on the ice thing another four hours? Doing two hours in the morning and two in the late afternoon gets swelling down. Then up most of the night cat napping she calls it. Fell asleep a little in the afternoon and [name] woke her up time for him to eat. Messes with your head and body will want to catch up and then over sleep. If wide awake then usually fall asleep to some documentary. Guilty pleasure of show "Naked and Afraid". Showed therapist a necklace that [name] gave her from [place] he was in [facility]. Wears the jewelry she gave him all the time. Therapist noted the pieces are really unique. Dad called her a "displaced mermaid". After school would go into the water as soon as possible.Hard the time getting her out of the water. Still use that on Pinterest and [employer] that she is an old Hilda chick should have been a mermaid. Talked about being pregnant with [name]. Mom always made it hard on her. Loved the baby though just like patient and [name] did. Her first name is Ival Bible. Everybody starting call [name]. Ronny and her got married when [name] was 62 months old. Barely stayed together a year. Couldn't trust him to be with her. He stayed home drinking and play the piano patient was working. His younger brother was  best friend. Marya Amsler still see him from time to time. Met Ron because friend with Mortimer Fries her brother.      Therapist provided space and support for patient to talk about thoughts and feelings assess helpful for get feelings out like continuing to drop on pain medication and patient labeling her feeling of being trapped helpful to accurately label feelings to work through them validating  patient on these difficulties.  Talked about struggles with sleep and validated patient on this exploring this topic to see what she does with herself when cannot go to sleep lead to some fun topics about shows she likes in particular negative and afraid patient explaining why she enjoys that show getting therapist interested in survival techniques.  Through session assess strength based as personality traits came out of patient both as a happy and as a mermaid aspects that were nice that she shared.  We also talk about the past and therapist this is helpful to share memories for her mood. Suicidal/Homicidal: No  Plan: Return again in 5 weeks.2.Utilize session for processing of thoughts and feelings, stress management, mood enhancement  Diagnosis: major depressive disorder, recurrent, moderate, degenerative disc disease, chronic pain syndrome, generalized anxiety disorder  Collaboration of Care: Other needed  Patient/Guardian was advised Release of Information must be obtained prior to any record release in order to collaborate their care with an outside provider. Patient/Guardian was advised if they have not already done so to contact the registration department to sign all necessary forms in order for [place] to release information regarding their care.   Consent: Patient/Guardian gives verbal consent for treatment and assignment of benefits for services provided during this visit. Patient/Guardian expressed understanding and agreed to proceed.   [name], [place] 10/03/2021

## 2021-10-17 ENCOUNTER — Other Ambulatory Visit: Payer: Self-pay | Admitting: Registered Nurse

## 2021-11-07 ENCOUNTER — Ambulatory Visit (INDEPENDENT_AMBULATORY_CARE_PROVIDER_SITE_OTHER): Payer: Medicare Other | Admitting: Licensed Clinical Social Worker

## 2021-11-07 DIAGNOSIS — F411 Generalized anxiety disorder: Secondary | ICD-10-CM

## 2021-11-07 DIAGNOSIS — G894 Chronic pain syndrome: Secondary | ICD-10-CM

## 2021-11-07 DIAGNOSIS — M5136 Other intervertebral disc degeneration, lumbar region: Secondary | ICD-10-CM

## 2021-11-07 DIAGNOSIS — F331 Major depressive disorder, recurrent, moderate: Secondary | ICD-10-CM | POA: Diagnosis not present

## 2021-11-07 NOTE — Progress Notes (Signed)
Virtual Visit via Video Note  I connected with [name] on 11/07/21 at  9:00 AM EDT by a video enabled telemedicine application and verified that I am speaking with the correct person using two identifiers.  Location: Patient: home Provider: office   I discussed the limitations of evaluation and management by telemedicine and the availability of in person appointments. The patient expressed understanding and agreed to proceed.  I discussed the assessment and treatment plan with the patient. The patient was provided an opportunity to ask questions and all were answered. The patient agreed with the plan and demonstrated an understanding of the instructions.   The patient was advised to call back or seek an in-person evaluation if the symptoms worsen or if the condition fails to improve as anticipated.  I provided 50 minutes of non-face-to-face time during this encounter.  THERAPIST PROGRESS NOTE  Session Time: 9:00 AM to 9:50 AM  Participation Level: Active  Behavioral Response: CasualAlertDysphoric  Type of Therapy: Individual Therapy  Treatment Goals addressed: Decrease in depression, anxiety, stress management, coping  ProgressTowards Goals: Progressing-patient struggling with medical issues but assess therapy helpful outlet for her for distraction, for supportive interventions  Interventions: Solution Focused, Strength-based, Supportive, and Other: Coping  Summary: HASET OAXACA is a 61 y.o. female who presents with up and down all night has a bad headache. Patient shares how she feels nothing goes right make plans and then headache creeps [place]. Missed so many Christmas and Thanksgiving over the year no idea how many have missed. Friend from [employer] coming to help change tired and probably won't be able to meet with him. Will miss that  because of her headache. Petron always knows when doesn't feel good snuggles up with patient.  Subject change to interesting  topic that patient showed a lot of knowledge of animals and their habitats.  Talked about the value of the animals in the world not wanting to lose them and problems with humans taking care of the environment putting them more at risk. Talked about her interest in nature shows and her knowledge of nature and animals.  Assessed positive topic for patient as she struggles with not feeling well.  She sharing experiences she has had with animals some of the shows that she is washed, her husband who is traveled and some of his experiences with being different places.     Therapist reviewed symptoms, facilitated expression of thoughts and feelings as a strategy to process and help cope with feelings.  Focused on patient's strengths her knowledge and interest in nature therapist impressed.  Also support she gets from her emotional support animal.  Also talked about struggles that can be difficult again processing and verbalizing feelings helpful with coping.  Therapist provided active listening open questions supportive interventions  Suicidal/Homicidal: No  Plan: Return again in 2 weeks.2.Utilize session for processing of thoughts and feelings, stress management, mood enhancement  Diagnosis: major depressive disorder, recurrent, moderate, degenerative disc disease, chronic pain syndrome, generalized anxiety disorder   Collaboration of Care: Other none needed  Patient/Guardian was advised Release of Information must be obtained prior to any record release in order to collaborate their care with an outside provider. Patient/Guardian was advised if they have not already done so to contact the registration department to sign all necessary forms in order for [place] to release information regarding their care.   Consent: Patient/Guardian gives verbal consent for treatment and assignment of benefits for services provided during this visit. Patient/Guardian  expressed understanding and agreed to proceed.   [name], LCSW 11/07/2021

## 2021-11-08 ENCOUNTER — Other Ambulatory Visit: Payer: Self-pay | Admitting: Family Medicine

## 2021-11-08 DIAGNOSIS — N3281 Overactive bladder: Secondary | ICD-10-CM

## 2021-11-16 ENCOUNTER — Encounter: Payer: Medicare Other | Attending: Physical Medicine and Rehabilitation | Admitting: Registered Nurse

## 2021-11-16 ENCOUNTER — Encounter: Payer: Self-pay | Admitting: Registered Nurse

## 2021-11-16 VITALS — BP 121/82 | HR 103 | Ht 67.0 in | Wt 227.0 lb

## 2021-11-16 DIAGNOSIS — Z79899 Other long term (current) drug therapy: Secondary | ICD-10-CM | POA: Diagnosis not present

## 2021-11-16 DIAGNOSIS — M546 Pain in thoracic spine: Secondary | ICD-10-CM | POA: Insufficient documentation

## 2021-11-16 DIAGNOSIS — G9050 Complex regional pain syndrome I, unspecified: Secondary | ICD-10-CM | POA: Diagnosis not present

## 2021-11-16 DIAGNOSIS — M47816 Spondylosis without myelopathy or radiculopathy, lumbar region: Secondary | ICD-10-CM | POA: Insufficient documentation

## 2021-11-16 DIAGNOSIS — M1712 Unilateral primary osteoarthritis, left knee: Secondary | ICD-10-CM | POA: Diagnosis not present

## 2021-11-16 DIAGNOSIS — M7062 Trochanteric bursitis, left hip: Secondary | ICD-10-CM | POA: Insufficient documentation

## 2021-11-16 DIAGNOSIS — G8929 Other chronic pain: Secondary | ICD-10-CM | POA: Insufficient documentation

## 2021-11-16 DIAGNOSIS — M7061 Trochanteric bursitis, right hip: Secondary | ICD-10-CM | POA: Diagnosis not present

## 2021-11-16 DIAGNOSIS — Z5181 Encounter for therapeutic drug level monitoring: Secondary | ICD-10-CM | POA: Insufficient documentation

## 2021-11-16 DIAGNOSIS — G894 Chronic pain syndrome: Secondary | ICD-10-CM | POA: Insufficient documentation

## 2021-11-16 DIAGNOSIS — M1711 Unilateral primary osteoarthritis, right knee: Secondary | ICD-10-CM | POA: Diagnosis not present

## 2021-11-16 MED ORDER — MORPHINE SULFATE ER 60 MG PO TBCR: 60.0000 mg | EXTENDED_RELEASE_TABLET | Freq: Two times a day (BID) | ORAL | 0 refills | Status: DC

## 2021-11-16 MED ORDER — MORPHINE SULFATE 15 MG PO TABS: 15.0000 mg | ORAL_TABLET | Freq: Four times a day (QID) | ORAL | 0 refills | Status: DC | PRN

## 2021-11-16 MED ORDER — MORPHINE SULFATE ER 30 MG PO TBCR: 30.0000 mg | EXTENDED_RELEASE_TABLET | Freq: Two times a day (BID) | ORAL | 0 refills | Status: DC

## 2021-11-16 NOTE — Progress Notes (Signed)
Subjective:    Patient ID: [name], female    DOB: 04-Feb-1961, 61 y.o.   MRN: [id number]  HPI: [name] is a 61 y.o. female who returns for follow up appointment for chronic pain and medication refill. She states her pain is located in  her mid- back mainly left side, lower back pain, bilateral hips and bilateral knee pain R>L. She rates her pain 7. Her current exercise regime is walking and performing stretching exercises.  [name] Morphine equivalent is 180.00 MME.   Last UDS was Performed on 09/21/2021, it was consistent.      Pain Inventory Average Pain 7 Pain Right Now 7 My pain is constant, sharp, burning, dull, stabbing, tingling, and aching  In the last 24 hours, has pain interfered with the following? General activity 10 Relation with others 10 Enjoyment of life 10 What TIME of day is your pain at its worst? morning , daytime, evening, night, and varies Sleep (in general) Fair  Pain is worse with: walking, bending, standing, and some activites Pain improves with: rest, medication, injections, and heat, therapy Relief from Meds: 6      Family History  Problem Relation Age of Onset   Depression Mother    Hypertension Mother    Hypertension Father    Heart failure Father    Diabetes Father    Heart attack Father    Colon cancer Neg Hx    Esophageal cancer Neg Hx    Stomach cancer Neg Hx    Rectal cancer Neg Hx    Social History   Socioeconomic History   Marital status: Married    Spouse name: [name]   Number of children: 1   Years of education: some college   Highest education level: 12th grade  Occupational History   Occupation: On disability    Employer: UNEMPLOYED  Tobacco Use   Smoking status: Former    Packs/day: 0.50    Types: Cigarettes    Quit date: 02/06/2016    Years since quitting: 5.7   Smokeless tobacco: Never  Vaping Use   Vaping Use: Never used  Substance and Sexual Activity   Alcohol use: No     Alcohol/week: 0.0 standard drinks of alcohol   Drug use: No   Sexual activity: Not Currently    Partners: Male  Other Topics Concern   Not on file  Social History Narrative   On disability. Lives with her husband. She has a adopted daughter. She likes to read in her free time. She does chair exercises for 30-45 mins everyday.   Social Determinants of Health   Financial Resource Strain: Low Risk  (04/30/2020)   Overall Financial Resource Strain (CARDIA)    Difficulty of Paying Living Expenses: Not hard at all  Food Insecurity: No Food Insecurity (04/30/2020)   Hunger Vital Sign    Worried About Running Out of Food in the Last Year: Never true    Ran Out of Food in the Last Year: Never true  Transportation Needs: No Transportation Needs (04/30/2020)   PRAPARE - Hydrologist (Medical): No    Lack of Transportation (Non-Medical): No  Physical Activity: Inactive (04/30/2020)   Exercise Vital Sign    Days of Exercise per Week: 0 days    Minutes of Exercise per Session: 0 min  Stress: No Stress Concern Present (04/30/2020)   [place]    Feeling of Stress :  Not at all  Social Connections: Socially Isolated (04/30/2020)   Social Connection and Isolation Panel [NHANES]    Frequency of Communication with Friends and Family: Never    Frequency of Social Gatherings with Friends and Family: Once a week    Attends Religious Services: Never    Marine scientist or Organizations: No    Attends Archivist Meetings: Never    Marital Status: Married   Past Surgical History:  Procedure Laterality Date   ABDOMINAL HYSTERECTOMY  May 2004   Basel Cell Carcinoma  06/2005, 07/2005   nasal tip and reconstruction   BREAST CYST ASPIRATION     CARPAL TUNNEL RELEASE  07/1993   both hands   COLONOSCOPY     HEMORROIDECTOMY  July 2003   KNEE ARTHROPLASTY  09/2001, 05/2003   left knee, chondromalasia    KNEE ARTHROSCOPY  jan 2005   Right knee, tisse release, chrondromalacia   REPLACEMENT TOTAL KNEE  Nov 2005   Left    THYROID LOBECTOMY  01/2005   malignant area removed from right lobe   THYROID SURGERY     thyroid lobe removal   TONSILLECTOMY  09/1972   TUBAL LIGATION  Sept 1999   Ulnar nerve entrapment  Feb 1995   left elbow   Past Medical History:  Diagnosis Date   Allergy    Anxiety    Cancer ([place])    cancerous nodules on thyroid   Cancer ([place])    basal cell Cancer-nose   Chronic pain syndrome    COPD (chronic obstructive pulmonary disease) ([place])    Degeneration of lumbar or lumbosacral intervertebral disc    Depression    Facet syndrome, lumbar    GERD (gastroesophageal reflux disease)    Hyperlipidemia    Intervertebral lumbar disc disorder with myelopathy, lumbar region    Lumbosacral spondylosis without myelopathy    Migraine without aura, with intractable migraine, so stated, without mention of status migrainosus    Primary localized osteoarthrosis, lower leg    Thyroid disease    hypothyroid   Unspecified musculoskeletal disorders and symptoms referable to neck    cervical/trapezius   There were no vitals taken for this visit.  Opioid Risk Score:   Fall Risk Score:  `1  Depression screen [facility] 2/9     09/21/2021    8:39 AM 08/24/2021    9:06 AM 07/27/2021   12:43 PM 06/29/2021   10:37 AM 03/23/2021   10:25 AM 02/23/2021    9:52 AM 01/24/2021    8:28 AM  Depression screen PHQ 2/9  Decreased Interest 1 0 '3 2 1 1 1  '$ Down, Depressed, Hopeless 1 0 '3 2 1 1 1  '$ PHQ - 2 Score 2 0 '6 4 2 2 2    '$ Review of Systems  Musculoskeletal:  Positive for arthralgias, back pain and gait problem.       Right knee pain, pain in all major joints  All other systems reviewed and are negative.      Objective:   Physical Exam Vitals and nursing note reviewed.  Constitutional:      Appearance: Normal appearance.  Cardiovascular:     Rate and Rhythm: Normal rate and regular  rhythm.     Pulses: Normal pulses.     Heart sounds: Normal heart sounds.  Pulmonary:     Effort: Pulmonary effort is normal.     Breath sounds: Normal breath sounds.  Musculoskeletal:     Cervical back: Normal range  of motion and neck supple.     Comments: Normal Muscle Bulk and Muscle Testing Reveals:  Upper Extremities: Full ROM and Muscle Strength 5/5 Thoracic Paraspinal Tenderness: T-7-T-9 Mainly Left Side  Lumbar Paraspinal Tenderness: L-3-L-5 Bilateral Greater Trochanter Tenderness Lower Extremities:Decreased ROM and Muscle Strength 5/5 Bilateral Lower Extremities Flexion Produces Pain into her Bilateral Patella's Arises from Table slowly using cane for support Antalgic Gait     Skin:    General: Skin is warm and dry.  Neurological:     Mental Status: She is alert and oriented to person, place, and time.  Psychiatric:        Mood and Affect: Mood normal.        Behavior: Behavior normal.         Assessment & Plan:  1.Chronic Bilateral Thoracic Back Pain/ Low Back Pain/ Lumbar Facet Arthropathy: Refilled MSIR 15 mg one tablet every 6 hours as needed #100 and Continue with Slow weaning of Morphine.   Refilled: MS Contin 60 mg and MS Contin  30 mg Q 12 hours to equal   90 mg  #60/60.  Second script sent for the following month.  Continue Gabapentin and Pamelor. 11/16/2021 We will continue the opioid monitoring program, this consists of regular clinic visits, examinations, urine drug screen, pill counts as well as use of [place] Controlled Substance Reporting system. A 12 month History has been reviewed on the [place] on 11/16/2021. 2. Degenerative Disk Disease:Encouraged Continue HEP as Tolertated  and heat therapy. Continue Current Medication Regime.11/16/2021 3. Bilateral Greater Trochanteric Bursitis:  Continue current treatment regimen with Heat and Ice Therapy. 11/16/2021 4. Osteoarthritis of Bilateral Knees: R>L  Continue current treatment modality with home exercise program and heat therapy. 11/16/2021 5. Migraine Headaches: Continue current medication regimen  Continue with  Migraine Journal. Aimovig discontinued due to Hives. Continue Lamictal and Relpax. Continue to Monitor.  11/16/2021 6. Smoking Cessation: She has quit smoking since 08/25/2016. Continue to Monitor. 11/16/2021 7.Constipation: Continue  Current medication regime with Linzess. 11/16/2021 8. Muscle Spasm: Continue current medication regime with Tizanidine.11/16/2021 9. DepressionAnxiety/ Panic Attacks: Continue current medication regimen and treatment modality.  Celexa.Lamictal  and  Counseling with [name] and  Dr. De Nurse Psychiatrist. 11/16/2021 10. RSD: Continue with current medication Regime with Gabapentin. 11/16/2021   F/U in 1 month

## 2021-11-18 ENCOUNTER — Telehealth: Payer: Self-pay | Admitting: Registered Nurse

## 2021-11-18 MED ORDER — NALOXONE HCL 4 MG/0.1ML NA LIQD: NASAL | 0 refills | Status: AC

## 2021-11-18 NOTE — Telephone Encounter (Signed)
Return Pharmacist Call:  Placed a call to [name] regarding Narcan Nasal Spray, she is in agreement. Narcan Nasal Spray e- scribed today. We will continue her current medication regimen as prescribed, [name] verbalizes understanding.

## 2021-11-22 ENCOUNTER — Ambulatory Visit (INDEPENDENT_AMBULATORY_CARE_PROVIDER_SITE_OTHER): Payer: Medicare Other | Admitting: Licensed Clinical Social Worker

## 2021-11-22 DIAGNOSIS — F331 Major depressive disorder, recurrent, moderate: Secondary | ICD-10-CM

## 2021-11-22 DIAGNOSIS — M5136 Other intervertebral disc degeneration, lumbar region: Secondary | ICD-10-CM | POA: Diagnosis not present

## 2021-11-22 DIAGNOSIS — F411 Generalized anxiety disorder: Secondary | ICD-10-CM

## 2021-11-22 DIAGNOSIS — G894 Chronic pain syndrome: Secondary | ICD-10-CM

## 2021-11-22 NOTE — Progress Notes (Signed)
Virtual Visit via Video Note  I connected with [name] on 11/22/21 at  8:00 AM EDT by a video enabled telemedicine application and verified that I am speaking with the correct person using two identifiers.  Location: Patient: home Provider: office   I discussed the limitations of evaluation and management by telemedicine and the availability of in person appointments. The patient expressed understanding and agreed to proceed.   I discussed the assessment and treatment plan with the patient. The patient was provided an opportunity to ask questions and all were answered. The patient agreed with the plan and demonstrated an understanding of the instructions.   The patient was advised to call back or seek an in-person evaluation if the symptoms worsen or if the condition fails to improve as anticipated.  I provided 52 minutes of non-face-to-face time during this encounter.  THERAPIST PROGRESS NOTE  Session Time: 8:00 AM to 8:52 AM  Participation Level: Active  Behavioral Response: CasualAlertsubdued  Type of Therapy: Individual Therapy  Treatment Goals addressed: Patient is isolated to have at home assess helpful for interaction in therapy as well as exploration of different topics to help with mood  ProgressTowards Goals: Progressing  Interventions: Solution Focused, Strength-based, Supportive, and Other: coping  Summary: TONI DEMO is a 61 y.o. female who presents with migraines two a month last for days. Sometimes moving her head she will vomit, the light bothers her. She has had them since a kid has gotten them longer and longer. Mad told her through menopause go away and they lied to her. Gets out for the doctor and about 3-4 times a year see sisters to take them out for different occassions. Not enough in life to get excited about. Explored things she could do at home. Has her coloring. [name] framed an owl that she did. Love Pinterest. Over 20 thousand pins.  Shared her titles "Yummy for Tummy" and "Dandy recipe deserts". Have # 6000 pins for these two categories. "My sex board drive you crazy" pictures of guys that think are good looking. Talked about streaming services and shows that she likes. "House of Dragon". Stakes of books beside her bed haven't felt like reading. Have a board in there titled "Inside my brain" thinks therapist would like. Sections are about being depressed, having anxiety. One part that struck her funny other people's saying and pins. One section about the Peanut gang. Calls it "peanut nobody can eat just one." Thinks it helped her coming up in school any subject and put a name and have almost total recall. Almost can see the page. Once in awhile look at Facebook to see how twins are doing raised them and care for them even though "don't care for me". Patient didn't get along with mom at first didn't like that patient raising her girls. As years went by found out that her angels weren't not angels at all. Finally understood they were not perfect. [place]mo [place]t and [place]. Could watch DD's Facebook but hurts more.  Therapist could see that being the case and better not to engage in something that would be hurtful for her.             Therapist assesses helpful for patient to talk about different subjects helps with mood think she is interested in things that keep her attention.  Assess helpful as patient herself says does not get out much isolated at home a lot so that can be source for depression.  Explored things patient does from  home that catches her interest such as coloring Pinterest noted the positive aspects of these activities therapist can see how creative patient can get with Pinterest application so that it is a positive activity for her.  Conversation open to processing feelings related to relationships with people from her past not to in-depth but still a channel that can open up for patient to talk about.  In general therapist provided  support and space for patient to talk about thoughts and feelings to help her cope with feelings and stressors. Suicidal/Homicidal: No  Plan: Return again in 2 weeks.2.Utilize session for processing of thoughts and feelings, stress management, mood enhancement  Diagnosis: major depressive disorder, recurrent, moderate, degenerative disc disease, chronic pain syndrome, generalized anxiety disorder  Collaboration of Care: Other none needed  Patient/Guardian was advised Release of Information must be obtained prior to any record release in order to collaborate their care with an outside provider. Patient/Guardian was advised if they have not already done so to contact the registration department to sign all necessary forms in order for [place] to release information regarding their care.   Consent: Patient/Guardian gives verbal consent for treatment and assignment of benefits for services provided during this visit. Patient/Guardian expressed understanding and agreed to proceed.   [name], LCSW 11/22/2021

## 2021-12-05 ENCOUNTER — Ambulatory Visit (INDEPENDENT_AMBULATORY_CARE_PROVIDER_SITE_OTHER): Payer: Medicare Other | Admitting: Licensed Clinical Social Worker

## 2021-12-05 DIAGNOSIS — M5136 Other intervertebral disc degeneration, lumbar region: Secondary | ICD-10-CM | POA: Diagnosis not present

## 2021-12-05 DIAGNOSIS — F411 Generalized anxiety disorder: Secondary | ICD-10-CM | POA: Diagnosis not present

## 2021-12-05 DIAGNOSIS — F331 Major depressive disorder, recurrent, moderate: Secondary | ICD-10-CM

## 2021-12-05 DIAGNOSIS — G894 Chronic pain syndrome: Secondary | ICD-10-CM
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
## 2021-12-10 ENCOUNTER — Other Ambulatory Visit: Payer: Self-pay | Admitting: Family Medicine

## 2021-12-10 DIAGNOSIS — N3281 Overactive bladder: Secondary | ICD-10-CM

## 2021-12-15 ENCOUNTER — Encounter: Payer: Medicare Other | Attending: Physical Medicine and Rehabilitation | Admitting: Registered Nurse

## 2021-12-15 VITALS — BP 109/75 | HR 99 | Ht 67.0 in | Wt 222.8 lb

## 2021-12-15 DIAGNOSIS — M7062 Trochanteric bursitis, left hip: Secondary | ICD-10-CM | POA: Insufficient documentation

## 2021-12-15 DIAGNOSIS — M546 Pain in thoracic spine: Secondary | ICD-10-CM | POA: Insufficient documentation

## 2021-12-15 DIAGNOSIS — M7061 Trochanteric bursitis, right hip: Secondary | ICD-10-CM | POA: Insufficient documentation

## 2021-12-15 DIAGNOSIS — G894 Chronic pain syndrome: Secondary | ICD-10-CM | POA: Diagnosis not present

## 2021-12-15 DIAGNOSIS — M1712 Unilateral primary osteoarthritis, left knee: Secondary | ICD-10-CM | POA: Insufficient documentation

## 2021-12-15 DIAGNOSIS — M1711 Unilateral primary osteoarthritis, right knee: Secondary | ICD-10-CM | POA: Insufficient documentation

## 2021-12-15 DIAGNOSIS — G8929 Other chronic pain: Secondary | ICD-10-CM | POA: Insufficient documentation

## 2021-12-15 DIAGNOSIS — G9050 Complex regional pain syndrome I, unspecified: Secondary | ICD-10-CM | POA: Insufficient documentation

## 2021-12-15 DIAGNOSIS — Z79899 Other long term (current) drug therapy: Secondary | ICD-10-CM | POA: Diagnosis not present

## 2021-12-15 DIAGNOSIS — Z5181 Encounter for therapeutic drug level monitoring: Secondary | ICD-10-CM | POA: Diagnosis not present

## 2021-12-15 DIAGNOSIS — Z79891 Long term (current) use of opiate analgesic: Secondary | ICD-10-CM | POA: Insufficient documentation

## 2021-12-15 DIAGNOSIS — M47816 Spondylosis without myelopathy or radiculopathy, lumbar region: Secondary | ICD-10-CM | POA: Diagnosis not present

## 2021-12-15 MED ORDER — MORPHINE SULFATE 15 MG PO TABS: 15.0000 mg | ORAL_TABLET | Freq: Four times a day (QID) | ORAL | 0 refills | Status: DC | PRN

## 2021-12-15 MED ORDER — MORPHINE SULFATE ER 30 MG PO TBCR: 30.0000 mg | EXTENDED_RELEASE_TABLET | Freq: Two times a day (BID) | ORAL | 0 refills | Status: DC

## 2021-12-15 MED ORDER — MORPHINE SULFATE ER 60 MG PO TBCR: 60.0000 mg | EXTENDED_RELEASE_TABLET | Freq: Two times a day (BID) | ORAL | 0 refills | Status: DC

## 2021-12-15 NOTE — Progress Notes (Signed)
Subjective:    Patient ID: [name], female    DOB: 12/05/1960, 61 y.o.   MRN: [id number]  HPI: [name] is a 61 y.o. female who returns for follow up appointment for chronic pain and medication refill. She states her pain is located in her mid - back (mainly left side) - lower back pain, bilateral hips and bilateral knee pain R>L.Marland Kitchen She rates  her pain 8. Her current exercise regime is walking and performing stretching exercises.  Ms. Rorabaugh Morphine equivalent is 180.00 MME.   UDS ordered today.     Pain Inventory Average Pain 8 Pain Right Now 8 My pain is constant, sharp, burning, dull, stabbing, tingling, and aching  In the last 24 hours, has pain interfered with the following? General activity 10 Relation with others 10 Enjoyment of life 10 What TIME of day is your pain at its worst? varies Sleep (in general) Poor  Pain is worse with: walking, bending, standing, and some activites Pain improves with: rest, heat/ice, therapy/exercise, medication, and injections Relief from Meds: 6  Family History  Problem Relation Age of Onset   Depression Mother    Hypertension Mother    Hypertension Father    Heart failure Father    Diabetes Father    Heart attack Father    Colon cancer Neg Hx    Esophageal cancer Neg Hx    Stomach cancer Neg Hx    Rectal cancer Neg Hx    Social History   Socioeconomic History   Marital status: Married    Spouse name: [name]   Number of children: 1   Years of education: some college   Highest education level: 12th grade  Occupational History   Occupation: On disability    Employer: UNEMPLOYED  Tobacco Use   Smoking status: Former    Packs/day: 0.50    Types: Cigarettes    Quit date: 02/06/2016    Years since quitting: 5.8   Smokeless tobacco: Never  Vaping Use   Vaping Use: Never used  Substance and Sexual Activity   Alcohol use: No    Alcohol/week: 0.0 standard drinks of alcohol   Drug use: No   Sexual  activity: Not Currently    Partners: Male  Other Topics Concern   Not on file  Social History Narrative   On disability. Lives with her husband. She has a adopted daughter. She likes to read in her free time. She does chair exercises for 30-45 mins everyday.   Social Determinants of Health   Financial Resource Strain: Low Risk  (04/30/2020)   Overall Financial Resource Strain (CARDIA)    Difficulty of Paying Living Expenses: Not hard at all  Food Insecurity: No Food Insecurity (04/30/2020)   Hunger Vital Sign    Worried About Running Out of Food in the Last Year: Never true    Ran Out of Food in the Last Year: Never true  Transportation Needs: No Transportation Needs (04/30/2020)   PRAPARE - Hydrologist (Medical): No    Lack of Transportation (Non-Medical): No  Physical Activity: Inactive (04/30/2020)   Exercise Vital Sign    Days of Exercise per Week: 0 days    Minutes of Exercise per Session: 0 min  Stress: No Stress Concern Present (04/30/2020)   [place]    Feeling of Stress : Not at all  Social Connections: Socially Isolated (04/30/2020)   Social Connection and Isolation  Panel [NHANES]    Frequency of Communication with Friends and Family: Never    Frequency of Social Gatherings with Friends and Family: Once a week    Attends Religious Services: Never    Marine scientist or Organizations: No    Attends Archivist Meetings: Never    Marital Status: Married   Past Surgical History:  Procedure Laterality Date   ABDOMINAL HYSTERECTOMY  May 2004   [place] Cell Carcinoma  06/2005, 07/2005   nasal tip and reconstruction   BREAST CYST ASPIRATION     CARPAL TUNNEL RELEASE  07/1993   both hands   COLONOSCOPY     HEMORROIDECTOMY  July 2003   KNEE ARTHROPLASTY  09/2001, 05/2003   left knee, chondromalasia   KNEE ARTHROSCOPY  jan 2005   Right knee, tisse release,  chrondromalacia   REPLACEMENT TOTAL KNEE  Nov 2005   Left    THYROID LOBECTOMY  01/2005   malignant area removed from right lobe   THYROID SURGERY     thyroid lobe removal   TONSILLECTOMY  09/1972   TUBAL LIGATION  Sept 1999   Ulnar nerve entrapment  Feb 1995   left elbow   Past Surgical History:  Procedure Laterality Date   ABDOMINAL HYSTERECTOMY  May 2004   [place] Cell Carcinoma  06/2005, 07/2005   nasal tip and reconstruction   BREAST CYST ASPIRATION     CARPAL TUNNEL RELEASE  07/1993   both hands   COLONOSCOPY     HEMORROIDECTOMY  July 2003   KNEE ARTHROPLASTY  09/2001, 05/2003   left knee, chondromalasia   KNEE ARTHROSCOPY  jan 2005   Right knee, tisse release, chrondromalacia   REPLACEMENT TOTAL KNEE  Nov 2005   Left    THYROID LOBECTOMY  01/2005   malignant area removed from right lobe   THYROID SURGERY     thyroid lobe removal   TONSILLECTOMY  09/1972   TUBAL LIGATION  Sept 1999   Ulnar nerve entrapment  Feb 1995   left elbow   Past Medical History:  Diagnosis Date   Allergy    Anxiety    Cancer ([place])    cancerous nodules on thyroid   Cancer ([place])    basal cell Cancer-nose   Chronic pain syndrome    COPD (chronic obstructive pulmonary disease) ([place])    Degeneration of lumbar or lumbosacral intervertebral disc    Depression    Facet syndrome, lumbar    GERD (gastroesophageal reflux disease)    Hyperlipidemia    Intervertebral lumbar disc disorder with myelopathy, lumbar region    Lumbosacral spondylosis without myelopathy    Migraine without aura, with intractable migraine, so stated, without mention of status migrainosus    Primary localized osteoarthrosis, lower leg    Thyroid disease    hypothyroid   Unspecified musculoskeletal disorders and symptoms referable to neck    cervical/trapezius   BP 109/75   Pulse 99   Ht '5\' 7"'$  (1.702 m)   Wt 222 lb 12.8 oz (101.1 kg)   SpO2 91%   BMI 34.90 kg/m   Opioid Risk Score:   Fall Risk Score:   `1  Depression screen [facility] 2/9     12/15/2021    9:01 AM 11/16/2021    9:04 AM 09/21/2021    8:39 AM 08/24/2021    9:06 AM 07/27/2021   12:43 PM 06/29/2021   10:37 AM 03/23/2021   10:25 AM  Depression screen PHQ 2/9  Decreased  Interest '1 1 1 '$ 0 '3 2 1  '$ Down, Depressed, Hopeless '1 1 1 '$ 0 '3 2 1  '$ PHQ - 2 Score '2 2 2 '$ 0 '6 4 2     '$ Review of Systems  Musculoskeletal:  Positive for arthralgias, back pain, gait problem and myalgias.  All other systems reviewed and are negative.     Objective:   Physical Exam Vitals and nursing note reviewed.  Constitutional:      Appearance: Normal appearance.  Cardiovascular:     Rate and Rhythm: Normal rate and regular rhythm.     Pulses: Normal pulses.     Heart sounds: Normal heart sounds.  Pulmonary:     Effort: Pulmonary effort is normal.     Breath sounds: Normal breath sounds.  Musculoskeletal:     Cervical back: Normal range of motion and neck supple.     Comments: Normal Muscle Bulk and Muscle Testing Reveals:  Upper Extremities: Full ROM and Muscle Strength 5/5  Thoracic Paraspinal Tenderness: T-4-T-6 Mainly Left Side Lumbar Paraspinal Tenderness: L-3-L-5 Lower Extremities: Decreased ROM and Muscle Strength 5/5 Bilateral Lower Extremities Flexion Produces Pain into her Bilateral Patella's Arises from Table slowly  using cane for support Antalgic Gait     Skin:    General: Skin is warm and dry.  Neurological:     Mental Status: She is alert and oriented to person, place, and time.  Psychiatric:        Mood and Affect: Mood normal.        Behavior: Behavior normal.         Assessment & Plan:  1.Chronic Bilateral Thoracic Back Pain/ Low Back Pain/ Lumbar Facet Arthropathy: Refilled MSIR 15 mg one tablet every 6 hours as needed #100 and   Refilled: MS Contin 60 mg and MS Contin  30 mg Q 12 hours to equal   90 mg  #60/60.Marland Kitchen Continue Gabapentin and Pamelor. 12/15/2021 We will continue the opioid monitoring program, this consists of  regular clinic visits, examinations, urine drug screen, pill counts as well as use of [place] Controlled Substance Reporting system. A 12 month History has been reviewed on the [place] on 12/15/2021. 2. Degenerative Disk Disease:Encouraged Continue HEP as Tolertated  and heat therapy. Continue Current Medication Regime.12/15/2021 3. Bilateral Greater Trochanteric Bursitis:  Continue current treatment regimen with Heat and Ice Therapy. 12/15/2021 4. Osteoarthritis of Bilateral Knees: R>L Continue current treatment modality with home exercise program and heat therapy. 12/15/2021 5. Migraine Headaches: Continue current medication regimen  Continue with  Migraine Journal. Aimovig discontinued due to Hives. Continue Lamictal and Relpax. Continue to Monitor.  12/15/2021 6. Smoking Cessation: She has quit smoking since 08/25/2016. Continue to Monitor. 12/15/2021 7.Constipation: Continue  Current medication regime with Linzess. 12/15/2021 8. Muscle Spasm: Continue current medication regime with Tizanidine.12/15/2021 9. DepressionAnxiety/ Panic Attacks: Continue current medication regimen and treatment modality.  Celexa.Lamictal  and  Counseling with [name] and  Dr. De Nurse Psychiatrist. 12/15/2021 10. RSD: Continue with current medication Regime with Gabapentin. 12/15/2021   F/U in 1 month

## 2021-12-19 ENCOUNTER — Encounter (HOSPITAL_COMMUNITY): Payer: Self-pay

## 2021-12-19 ENCOUNTER — Ambulatory Visit (INDEPENDENT_AMBULATORY_CARE_PROVIDER_SITE_OTHER): Payer: Medicare Other | Admitting: Licensed Clinical Social Worker

## 2021-12-19 DIAGNOSIS — M5136 Other intervertebral disc degeneration, lumbar region: Secondary | ICD-10-CM

## 2021-12-19 DIAGNOSIS — F411 Generalized anxiety disorder: Secondary | ICD-10-CM

## 2021-12-19 DIAGNOSIS — G894 Chronic pain syndrome: Secondary | ICD-10-CM

## 2021-12-19 DIAGNOSIS — F331 Major depressive disorder, recurrent, moderate: Secondary | ICD-10-CM

## 2021-12-19 LAB — DRUG TOX MONITOR 1 W/CONF, ORAL FLD

## 2021-12-19 LAB — DRUG TOX ALC METAB W/CON, ORAL FLD: Alcohol Metabolite: NEGATIVE ng/mL (ref <25)

## 2021-12-19 NOTE — Progress Notes (Signed)
Therapist contacted patient through My Chart and also called. Husband trouble breathing so unable to get to wife to wake her if she fell asleep. Session is a cancel.

## 2022-01-02 ENCOUNTER — Ambulatory Visit (INDEPENDENT_AMBULATORY_CARE_PROVIDER_SITE_OTHER): Payer: Medicare Other | Admitting: Licensed Clinical Social Worker

## 2022-01-02 DIAGNOSIS — F331 Major depressive disorder, recurrent, moderate: Secondary | ICD-10-CM | POA: Diagnosis not present

## 2022-01-02 DIAGNOSIS — F411 Generalized anxiety disorder: Secondary | ICD-10-CM

## 2022-01-02 DIAGNOSIS — G894 Chronic pain syndrome: Secondary | ICD-10-CM

## 2022-01-02 DIAGNOSIS — M5136 Other intervertebral disc degeneration, lumbar region: Secondary | ICD-10-CM | POA: Diagnosis not present
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
## 2022-01-10 ENCOUNTER — Encounter: Payer: Self-pay | Admitting: Registered Nurse

## 2022-01-10 ENCOUNTER — Encounter: Payer: Medicare Other | Attending: Physical Medicine and Rehabilitation | Admitting: Registered Nurse

## 2022-01-10 VITALS — BP 122/77 | HR 5 | Ht 67.0 in | Wt 224.0 lb

## 2022-01-10 DIAGNOSIS — G894 Chronic pain syndrome: Secondary | ICD-10-CM

## 2022-01-10 DIAGNOSIS — Z5181 Encounter for therapeutic drug level monitoring: Secondary | ICD-10-CM

## 2022-01-10 DIAGNOSIS — M546 Pain in thoracic spine: Secondary | ICD-10-CM | POA: Diagnosis not present

## 2022-01-10 DIAGNOSIS — Z79899 Other long term (current) drug therapy: Secondary | ICD-10-CM

## 2022-01-10 DIAGNOSIS — M1711 Unilateral primary osteoarthritis, right knee: Secondary | ICD-10-CM | POA: Diagnosis not present

## 2022-01-10 DIAGNOSIS — Z79891 Long term (current) use of opiate analgesic: Secondary | ICD-10-CM

## 2022-01-10 DIAGNOSIS — G9050 Complex regional pain syndrome I, unspecified: Secondary | ICD-10-CM | POA: Diagnosis not present

## 2022-01-10 DIAGNOSIS — M47816 Spondylosis without myelopathy or radiculopathy, lumbar region: Secondary | ICD-10-CM

## 2022-01-10 DIAGNOSIS — M7061 Trochanteric bursitis, right hip: Secondary | ICD-10-CM

## 2022-01-10 DIAGNOSIS — G8929 Other chronic pain: Secondary | ICD-10-CM

## 2022-01-10 DIAGNOSIS — M7062 Trochanteric bursitis, left hip: Secondary | ICD-10-CM

## 2022-01-10 MED ORDER — MORPHINE SULFATE ER 60 MG PO TBCR: 60.0000 mg | EXTENDED_RELEASE_TABLET | Freq: Two times a day (BID) | ORAL | 0 refills | Status: DC

## 2022-01-10 MED ORDER — MORPHINE SULFATE 15 MG PO TABS: 15.0000 mg | ORAL_TABLET | Freq: Four times a day (QID) | ORAL | 0 refills | Status: DC | PRN

## 2022-01-10 MED ORDER — MORPHINE SULFATE ER 30 MG PO TBCR: 30.0000 mg | EXTENDED_RELEASE_TABLET | Freq: Two times a day (BID) | ORAL | 0 refills | Status: DC

## 2022-01-10 NOTE — Progress Notes (Signed)
Subjective:    Patient ID: [name], female    DOB: 15-Jul-1960, 61 y.o.   MRN: [id number]  HPI: [name] is a 61 y.o. female who returns for follow up appointment for chronic pain and medication refill. She states  her pain is located in her mid- back mainly left side, lower back, bilateral hips and right knee pain. She rates her pain 8. Her current exercise regime is walking and performing stretching exercises.  [name] equivalent is 244.00  MME.   Last Oral Swab was Performed on 12/15/2021, it was consistent.      Pain Inventory Average Pain 8 Pain Right Now 8 My pain is constant, sharp, burning, dull, stabbing, tingling, and aching  In the last 24 hours, has pain interfered with the following? General activity 10 Relation with others 10 Enjoyment of life 10 What TIME of day is your pain at its worst? morning , daytime, evening, and night Sleep (in general) Poor  Pain is worse with: walking, bending, standing, and some activites Pain improves with: rest, heat/ice, therapy/exercise, medication, TENS, and injections Relief from Meds: 7  Family History  Problem Relation Age of Onset   Depression Mother    Hypertension Mother    Hypertension Father    Heart failure Father    Diabetes Father    Heart attack Father    Colon cancer Neg Hx    Esophageal cancer Neg Hx    Stomach cancer Neg Hx    Rectal cancer Neg Hx    Social History   Socioeconomic History   Marital status: Married    Spouse name: [name]   Number of children: 1   Years of education: some college   Highest education level: 12th grade  Occupational History   Occupation: On disability    Employer: UNEMPLOYED  Tobacco Use   Smoking status: Former    Packs/day: 0.50    Types: Cigarettes    Quit date: 02/06/2016    Years since quitting: 5.9   Smokeless tobacco: Never  Vaping Use   Vaping Use: Never used  Substance and Sexual Activity   Alcohol use: No     Alcohol/week: 0.0 standard drinks of alcohol   Drug use: No   Sexual activity: Not Currently    Partners: Male  Other Topics Concern   Not on file  Social History Narrative   On disability. Lives with her husband. She has a adopted daughter. She likes to read in her free time. She does chair exercises for 30-45 mins everyday.   Social Determinants of Health   Financial Resource Strain: Low Risk  (04/30/2020)   Overall Financial Resource Strain (CARDIA)    Difficulty of Paying Living Expenses: Not hard at all  Food Insecurity: No Food Insecurity (04/30/2020)   Hunger Vital Sign    Worried About Running Out of Food in the Last Year: Never true    Ran Out of Food in the Last Year: Never true  Transportation Needs: No Transportation Needs (04/30/2020)   PRAPARE - Hydrologist (Medical): No    Lack of Transportation (Non-Medical): No  Physical Activity: Inactive (04/30/2020)   Exercise Vital Sign    Days of Exercise per Week: 0 days    Minutes of Exercise per Session: 0 min  Stress: No Stress Concern Present (04/30/2020)   [place]    Feeling of Stress : Not at all  Social Connections: Socially Isolated (04/30/2020)   Social Connection and Isolation Panel [NHANES]    Frequency of Communication with Friends and Family: Never    Frequency of Social Gatherings with Friends and Family: Once a week    Attends Religious Services: Never    Marine scientist or Organizations: No    Attends Archivist Meetings: Never    Marital Status: Married   Past Surgical History:  Procedure Laterality Date   ABDOMINAL HYSTERECTOMY  May 2004   [place] Cell Carcinoma  06/2005, 07/2005   nasal tip and reconstruction   BREAST CYST ASPIRATION     CARPAL TUNNEL RELEASE  07/1993   both hands   COLONOSCOPY     HEMORROIDECTOMY  July 2003   KNEE ARTHROPLASTY  09/2001, 05/2003   left knee, chondromalasia    KNEE ARTHROSCOPY  jan 2005   Right knee, tisse release, chrondromalacia   REPLACEMENT TOTAL KNEE  Nov 2005   Left    THYROID LOBECTOMY  01/2005   malignant area removed from right lobe   THYROID SURGERY     thyroid lobe removal   TONSILLECTOMY  09/1972   TUBAL LIGATION  Sept 1999   Ulnar nerve entrapment  Feb 1995   left elbow   Past Surgical History:  Procedure Laterality Date   ABDOMINAL HYSTERECTOMY  May 2004   [place] Cell Carcinoma  06/2005, 07/2005   nasal tip and reconstruction   BREAST CYST ASPIRATION     CARPAL TUNNEL RELEASE  07/1993   both hands   COLONOSCOPY     HEMORROIDECTOMY  July 2003   KNEE ARTHROPLASTY  09/2001, 05/2003   left knee, chondromalasia   KNEE ARTHROSCOPY  jan 2005   Right knee, tisse release, chrondromalacia   REPLACEMENT TOTAL KNEE  Nov 2005   Left    THYROID LOBECTOMY  01/2005   malignant area removed from right lobe   THYROID SURGERY     thyroid lobe removal   TONSILLECTOMY  09/1972   TUBAL LIGATION  Sept 1999   Ulnar nerve entrapment  Feb 1995   left elbow   Past Medical History:  Diagnosis Date   Allergy    Anxiety    Cancer ([place])    cancerous nodules on thyroid   Cancer ([place])    basal cell Cancer-nose   Chronic pain syndrome    COPD (chronic obstructive pulmonary disease) ([place])    Degeneration of lumbar or lumbosacral intervertebral disc    Depression    Facet syndrome, lumbar    GERD (gastroesophageal reflux disease)    Hyperlipidemia    Intervertebral lumbar disc disorder with myelopathy, lumbar region    Lumbosacral spondylosis without myelopathy    Migraine without aura, with intractable migraine, so stated, without mention of status migrainosus    Primary localized osteoarthrosis, lower leg    Thyroid disease    hypothyroid   Unspecified musculoskeletal disorders and symptoms referable to neck    cervical/trapezius   There were no vitals taken for this visit.  Opioid Risk Score:   Fall Risk Score:  `1  Depression  screen [facility] 2/9     12/15/2021    9:01 AM 11/16/2021    9:04 AM 09/21/2021    8:39 AM 08/24/2021    9:06 AM 07/27/2021   12:43 PM 06/29/2021   10:37 AM 03/23/2021   10:25 AM  Depression screen PHQ 2/9  Decreased Interest '1 1 1 '$ 0 '3 2 1  '$ Down, Depressed, Hopeless 1  1 1 0 '3 2 1  '$ PHQ - 2 Score '2 2 2 '$ 0 '6 4 2    '$ Review of Systems  Musculoskeletal:  Positive for arthralgias, back pain and gait problem.       Pain all over the body & both feet  All other systems reviewed and are negative.     Objective:   Physical Exam Vitals and nursing note reviewed.  Constitutional:      Appearance: Normal appearance.  Cardiovascular:     Rate and Rhythm: Normal rate and regular rhythm.     Pulses: Normal pulses.     Heart sounds: Normal heart sounds.  Pulmonary:     Effort: Pulmonary effort is normal.     Breath sounds: Normal breath sounds.  Musculoskeletal:     Cervical back: Normal range of motion and neck supple.     Comments: Normal Muscle Bulk and Muscle Testing Reveals:  Upper Extremities: Full  ROM and Muscle Strength 5/5  Thoracic Paraspinal Tenderness: T-1-T-7 Mainly Left Side Lumbar Paraspinal Tenderness: L-3-L-5 Lower Extremities: Decreased ROM and Muscle Strength 5/5 Right Lower Extremity Flexion Produces Pain into her Right Patella Arises from Table slowly using cane for support Antalgic  Gait     Skin:    General: Skin is warm and dry.  Neurological:     Mental Status: She is alert and oriented to person, place, and time.  Psychiatric:        Mood and Affect: Mood normal.        Behavior: Behavior normal.         Assessment & Plan:  1.Chronic Bilateral Thoracic Back Pain/ Low Back Pain/ Lumbar Facet Arthropathy: Refilled MSIR 15 mg one tablet every 6 hours as needed #100 and   Refilled: MS Contin 60 mg and MS Contin  30 mg Q 12 hours to equal   90 mg  #60/60.Marland Kitchen Continue Gabapentin and Pamelor. 01/10/2022 We will continue the opioid monitoring program, this consists of  regular clinic visits, examinations, urine drug screen, pill counts as well as use of [place] Controlled Substance Reporting system. A 12 month History has been reviewed on the [place] on 01/10/2022. 2. Degenerative Disk Disease:Encouraged Continue HEP as Tolertated  and heat therapy. Continue Current Medication Regime.01/10/2022 3. Bilateral Greater Trochanteric Bursitis:  Continue current treatment regimen with Heat and Ice Therapy. 01/10/2022 4. Osteoarthritis of Bilateral Knees: R>L Continue current treatment modality with home exercise program and heat therapy. 01/10/2022 5. Migraine Headaches: Continue current medication regimen  Continue with  Migraine Journal. Aimovig discontinued due to Hives. Continue Lamictal and Relpax. Continue to Monitor.  01/10/2022 6. Smoking Cessation: She has quit smoking since 08/25/2016. Continue to Monitor. 01/10/2022 7.Constipation: Continue  Current medication regime with Linzess. 01/10/2022 8. Muscle Spasm: Continue current medication regime with Tizanidine.01/10/2022 9. DepressionAnxiety/ Panic Attacks: Continue current medication regimen and treatment modality.  Celexa.Lamictal  and  Counseling with [name] and  Dr. De Nurse Psychiatrist. 01/10/2022 10. RSD: Continue with current medication Regime with Gabapentin. 01/10/2022   F/U in 1 month

## 2022-01-13 ENCOUNTER — Other Ambulatory Visit: Payer: Self-pay | Admitting: Registered Nurse

## 2022-01-16 ENCOUNTER — Ambulatory Visit (INDEPENDENT_AMBULATORY_CARE_PROVIDER_SITE_OTHER): Payer: Medicare Other | Admitting: Licensed Clinical Social Worker

## 2022-01-16 DIAGNOSIS — G894 Chronic pain syndrome: Secondary | ICD-10-CM

## 2022-01-16 DIAGNOSIS — F331 Major depressive disorder, recurrent, moderate: Secondary | ICD-10-CM | POA: Diagnosis not present

## 2022-01-16 DIAGNOSIS — M5136 Other intervertebral disc degeneration, lumbar region: Secondary | ICD-10-CM

## 2022-01-16 DIAGNOSIS — F411 Generalized anxiety disorder: Secondary | ICD-10-CM

## 2022-01-16 NOTE — Progress Notes (Signed)
Virtual Visit via Video Note  I connected with [name] on 01/16/22 at  8:00 AM EST by a video enabled telemedicine application and verified that I am speaking with the correct person using two identifiers.  Location: Patient: home Provider: office   I discussed the limitations of evaluation and management by telemedicine and the availability of in person appointments. The patient expressed understanding and agreed to proceed.  I discussed the assessment and treatment plan with the patient. The patient was provided an opportunity to ask questions and all were answered. The patient agreed with the plan and demonstrated an understanding of the instructions.   The patient was advised to call back or seek an in-person evaluation if the symptoms worsen or if the condition fails to improve as anticipated.  I provided 50 minutes of non-face-to-face time during this encounter.  THERAPIST PROGRESS NOTE  Session Time: 8:00 AM to 8:50 AM  Participation Level: Active  Behavioral Response: CasualAlertDysphoric-has headache  Type of Therapy: Individual Therapy  Treatment Goals addressed:  Patient shares mood improves through interaction and therapy, this happens through positive topics, through working through stressors,   ProgressTowards Goals: Progressing-therapy helps as a distraction as well as supportive and strength-based interventions mood enhancement through talking about positive interest in activities  Interventions: Solution Focused, Strength-based, Supportive, and Other: coping  Summary: [name] is a 61 y.o. female who presents with patient has a headache and haven't been able to sleep a couple of nights. Even if go to sleep can't stay asleep. Wakes up 30 minutes or 45 minutes later doesn't get good sleep. Has had insomnia her whole life. This is from headache not insomnia.  Therapist explored what she does to cope and she has medicine but doesn't work. Great  medications sees on television when doctor tried to get insurance won't pay for it. Doesn't do any good to see the commercials. Went three nights and two days earlier this week without sleeping. Slept day before yesterday, yesterday got a nap or two nothing since the headache. Can't read when head hurts. Television helps get to sleep but can't stay asleep. Patient and [name] finally worked it out on the phone. Patient is going down Friday early. Patient is going to come home Christmas morning doesn't want [name] to spend the whole day on his own. Raford Pitcher will check on him maybe stay one night she is their friend and cleans for them. [name] has her doing a lot of their errands. Patient said she is family. [name] sleeps with patient snuggles up next to patient. Therapist notes very comforting. Going to pay Raford Pitcher to help wrap Christmas gifts. She likes one of patient's sweets and patient still doing that during the day actually help to lose weight having something in your mouth cuts down on eating and snaking. Having [name] is saving a lot of money. Told her she can move in anytime she wants to. Would love to have her cooking would love to have her own but can't stand up long enough to cook. Has been [employer] and [employer] a lot. Transitioned to talking about what she bought as presents. Last year bought a bunch of things to celebrate their Christmas [place]y [place] which is the winter solstice.  [name] is Writer provided space and support for patient to talk about thoughts and feelings in session.  Assess helpful to talk about mood enhancing subjects as distraction for patient as she deals with different medical  issues today headache and trouble sleeping.  We covered issues like person who is like family who comes to the house and does a lot of things for them that they need now.  Plans for Christmas.  Positive subject that patient has lost a lot of weight and she described tips to help.  Assessed patient isolated helps  to have connection and to share about things on her mind, things happening in her life.  Therapist provided active listening open questions supportive interventions. Suicidal/Homicidal: No  Plan: Return again in 4 weeks.2.Patient processed thoughts and feelings to help with coping  Diagnosis: major depressive disorder, recurrent, moderate, degenerative disc disease, chronic pain syndrome, generalized anxiety disorder  Collaboration of Care: Other none needed  Patient/Guardian was advised Release of Information must be obtained prior to any record release in order to collaborate their care with an outside provider. Patient/Guardian was advised if they have not already done so to contact the registration department to sign all necessary forms in order for [place] to release information regarding their care.   Consent: Patient/Guardian gives verbal consent for treatment and assignment of benefits for services provided during this visit. Patient/Guardian expressed understanding and agreed to proceed.   [name], LCSW 01/16/2022

## 2022-01-19 ENCOUNTER — Ambulatory Visit (INDEPENDENT_AMBULATORY_CARE_PROVIDER_SITE_OTHER): Payer: Medicare Other | Admitting: Sports Medicine

## 2022-01-19 DIAGNOSIS — Z0000 Encounter for general adult medical examination without abnormal findings: Secondary | ICD-10-CM

## 2022-01-19 NOTE — Patient Instructions (Signed)
[place] Maintenance Summary and Written Plan of Care  [name] ,  Thank you for allowing me to perform your Medicare Annual Wellness Visit and for your ongoing commitment to your health.   Health Maintenance & Immunization History Health Maintenance  Topic Date Due   DTaP/Tdap/Td (1 - Tdap) Never done   COVID-19 Vaccine (1) 02/04/2022 (Originally 04/26/1965)   Zoster Vaccines- Shingrix (1 of 2) 04/20/2022 (Originally 04/25/1977)   INFLUENZA VACCINE  05/07/2022 (Originally 09/06/2021)   MAMMOGRAM  01/20/2023 (Originally 11/08/2019)   COLONOSCOPY (Pts 45-65yr Insurance coverage will need to be confirmed)  01/20/2023 (Originally 04/25/2021)   HIV Screening  01/20/2023 (Originally 04/25/1973)   Medicare Annual Wellness (AWV)  01/20/2023   Hepatitis C Screening  Completed   Pneumococcal Vaccine [id number] old  Aged Out   HPV VACCINES  Aged Out   Immunization History  Administered Date(s) Administered   Pneumococcal Polysaccharide-23 04/20/2011    These are the patient goals that we discussed:  Goals Addressed               This Visit's Progress     Patient Stated (pt-stated)        Patient would like to loose 50 lbs.         This is a list of Health Maintenance Items that are overdue or due now: Health Maintenance Due  Topic Date Due   DTaP/Tdap/Td (1 - Tdap) Never done   Influenza vaccine Td vaccine Screening mammography Colorectal cancer screening Shingles vaccine  Patient declined influenza vaccine.  Orders/Referrals Placed Today: No orders of the defined types were placed in this encounter.  (Contact our referral department at [phone] you have not spoken with someone about your referral appointment within the next 5 days)    Follow-up Plan Follow-up with MHali Marry MD as planned Schedule tetanus and shingles vaccine at the pharmacy.  Patient would like to discuss mammogram and colorectal cancer screening  with PCP. Medicare wellness visit in one year. Patient will access AVS on my chart.      Health Maintenance, Female Adopting a healthy lifestyle and getting preventive care are important in promoting health and wellness. Ask your health care provider about: The right schedule for you to have regular tests and exams. Things you can do on your own to prevent diseases and keep yourself healthy. What should I know about diet, weight, and exercise? Eat a healthy diet  Eat a diet that includes plenty of vegetables, fruits, low-fat dairy products, and lean protein. Do not eat a lot of foods that are high in solid fats, added sugars, or sodium. Maintain a healthy weight Body mass index (BMI) is used to identify weight problems. It estimates body fat based on height and weight. Your health care provider can help determine your BMI and help you achieve or maintain a healthy weight. Get regular exercise Get regular exercise. This is one of the most important things you can do for your health. Most adults should: Exercise for at least 150 minutes each week. The exercise should increase your heart rate and make you sweat (moderate-intensity exercise). Do strengthening exercises at least twice a week. This is in addition to the moderate-intensity exercise. Spend less time sitting. Even light physical activity can be beneficial. Watch cholesterol and blood lipids Have your blood tested for lipids and cholesterol at 63years of age, then have this test every 5 years. Have your cholesterol levels checked more often if: Your  lipid or cholesterol levels are high. You are older than 63 years of age. You are at high risk for heart disease. What should I know about cancer screening? Depending on your health history and family history, you may need to have cancer screening at various ages. This may include screening for: Breast cancer. Cervical cancer. Colorectal cancer. Skin cancer. Lung cancer. What  should I know about heart disease, diabetes, and high blood pressure? Blood pressure and heart disease High blood pressure causes heart disease and increases the risk of stroke. This is more likely to develop in people who have high blood pressure readings or are overweight. Have your blood pressure checked: Every 3-5 years if you are 84-34 years of age. Every year if you are 46 years old or older. Diabetes Have regular diabetes screenings. This checks your fasting blood sugar level. Have the screening done: Once every three years after age 86 if you are at a normal weight and have a low risk for diabetes. More often and at a younger age if you are overweight or have a high risk for diabetes. What should I know about preventing infection? Hepatitis B If you have a higher risk for hepatitis B, you should be screened for this virus. Talk with your health care provider to find out if you are at risk for hepatitis B infection. Hepatitis C Testing is recommended for: Everyone born from [birth year] through 1967. Anyone with known risk factors for hepatitis C. Sexually transmitted infections (STIs) Get screened for STIs, including gonorrhea and chlamydia, if: You are sexually active and are younger than 63 years of age. You are older than 63 years of age and your health care provider tells you that you are at risk for this type of infection. Your sexual activity has changed since you were last screened, and you are at increased risk for chlamydia or gonorrhea. Ask your health care provider if you are at risk. Ask your health care provider about whether you are at high risk for HIV. Your health care provider may recommend a prescription medicine to help prevent HIV infection. If you choose to take medicine to prevent HIV, you should first get tested for HIV. You should then be tested every 3 months for as long as you are taking the medicine. Pregnancy If you are about to stop having your period  (premenopausal) and you may become pregnant, seek counseling before you get pregnant. Take 400 to 800 micrograms (mcg) of folic acid every day if you become pregnant. Ask for birth control (contraception) if you want to prevent pregnancy. Osteoporosis and menopause Osteoporosis is a disease in which the bones lose minerals and strength with aging. This can result in bone fractures. If you are 81 years old or older, or if you are at risk for osteoporosis and fractures, ask your health care provider if you should: Be screened for bone loss. Take a calcium or vitamin D supplement to lower your risk of fractures. Be given hormone replacement therapy (HRT) to treat symptoms of menopause. Follow these instructions at home: Alcohol use Do not drink alcohol if: Your health care provider tells you not to drink. You are pregnant, may be pregnant, or are planning to become pregnant. If you drink alcohol: Limit how much you have to: 0-1 drink a day. Know how much alcohol is in your drink. In the [place] one drink equals one 12 oz bottle of beer (355 mL), one 5 oz glass of wine (148 mL), or one 1  oz glass of hard liquor (44 mL). Lifestyle Do not use any products that contain nicotine or tobacco. These products include cigarettes, chewing tobacco, and vaping devices, such as e-cigarettes. If you need help quitting, ask your health care provider. Do not use street drugs. Do not share needles. Ask your health care provider for help if you need support or information about quitting drugs. General instructions Schedule regular health, dental, and eye exams. Stay current with your vaccines. Tell your health care provider if: You often feel depressed. You have ever been abused or do not feel safe at home. Summary Adopting a healthy lifestyle and getting preventive care are important in promoting health and wellness. Follow your health care provider's instructions about healthy diet, exercising, and getting  tested or screened for diseases. Follow your health care provider's instructions on monitoring your cholesterol and blood pressure. This information is not intended to replace advice given to you by your health care provider. Make sure you discuss any questions you have with your health care provider. Document Revised: 06/14/2020 Document Reviewed: 06/14/2020 Elsevier Patient Education  [place].

## 2022-01-19 NOTE — Progress Notes (Signed)
MEDICARE ANNUAL WELLNESS VISIT  01/19/2022  Telephone Visit Disclaimer This Medicare AWV was conducted by telephone due to national recommendations for restrictions regarding the COVID-19 Pandemic (e.g. social distancing).  I verified, using two identifiers, that I am speaking with [name] or their authorized healthcare agent. I discussed the limitations, risks, security, and privacy concerns of performing an evaluation and management service by telephone and the potential availability of an in-person appointment in the future. The patient expressed understanding and agreed to proceed.  Location of Patient: Home Location of Provider (nurse):  In the office.  Subjective:    [name] is a 61 y.o. female patient of Metheney, Rene Kocher, MD who had a Medicare Annual Wellness Visit today via telephone. [name] is Disabled and lives with their spouse. she has 2 children, one is adopted. she reports that she is socially active and does interact with friends/family regularly. she is minimally physically active and enjoys reading and adult coloring.  Patient Care Team: Hali Marry, MD as PCP - General (Family Medicine)     01/19/2022    2:10 PM 06/29/2021   10:37 AM 04/30/2020    8:18 AM 10/25/2016    9:02 AM 08/25/2016    8:52 AM 07/14/2016    9:18 AM 05/15/2016   10:50 AM  Advanced Directives  Does Patient Have a Medical Advance Directive? _0  Yes Yes  Type of Advance Directive Living will Healthcare Power of [place];Living will [place];Living will [place];Living will [place];Living will [place];Living will  Does patient want to make changes to medical advance directive? No - Patient declined  No - Patient declined      Copy of [place] in Chart?   No - copy requested No - copy requested No - copy requested No  - copy requested     Hospital Utilization Over the Past 12 Months: # of hospitalizations or ER visits: 1 # of surgeries: 0  Review of Systems    Patient reports that her overall health is unchanged compared to last year.  History obtained from chart review and the patient  Patient Reported Readings (BP, Pulse, CBG, Weight, etc) none  Pain Assessment Pain : 0-10 Pain Score: 8  Pain Type: Chronic pain Pain Location: Generalized Pain Descriptors / Indicators: Constant Pain Onset: More than a month ago Pain Frequency: Constant Pain Relieving Factors: medication  Pain Relieving Factors: medication  Current Medications & Allergies (verified) Allergies as of 01/19/2022       Reactions   Sumatriptan    Other reaction(s): Other Drops BP (does not take at home)   Aspirin Other (See Comments)   Abdominal bleeding   Ibuprofen Swelling   Imitrex [sumatriptan Base] Other (See Comments)   Drops BP   Norvasc [amlodipine Besylate] Swelling   Nsaids Swelling   Tape    Skin blisters under surgical tape   Topamax [topiramate] Other (See Comments)   Hair loss        Medication List        Accurate as of January 19, 2022  2:24 PM. If you have any questions, ask your nurse or doctor.          AMBULATORY NON FORMULARY MEDICATION Medication Name: Patient can safely propel the wheelchair in the home or has a caregiver who can provide assistance. Length of need Lifetime. [id number] suffers from reflex  sympathetic dystrophy, DDD, lumbar facet arthropathy, and degenerative arthritis of right knee   cefdinir 300 MG capsule Commonly known as: OMNICEF Take by mouth.   citalopram 40 MG tablet Commonly known as: CELEXA Take by mouth.   eletriptan 20 MG tablet Commonly known as: RELPAX TAKE 1 TABLET BY MOUTH AS NEEDED FOR MIGRAINE HEADACHE. MAY REPEAT IN 2 HOURS IF HEADACHE PERSISTS OR RECURS.   gabapentin 800 MG tablet Commonly known as: NEURONTIN Take by mouth.    lamoTRIgine 25 MG tablet Commonly known as: LAMICTAL Take by mouth.   levothyroxine 25 MCG tablet Commonly known as: SYNTHROID Take by mouth.   Linzess 145 MCG Caps capsule Generic drug: linaclotide TAKE 1 CAPSULE(145 MCG) BY MOUTH DAILY   morphine 30 MG 12 hr tablet Commonly known as: MS CONTIN Take 1 tablet (30 mg total) by mouth every 12 (twelve) hours.   morphine 60 MG 12 hr tablet Commonly known as: MS CONTIN Take 1 tablet (60 mg total) by mouth every 12 (twelve) hours.   morphine 15 MG tablet Commonly known as: MSIR Take 1 tablet (15 mg total) by mouth every 6 (six) hours as needed for severe pain.   naloxone 4 MG/0.1ML Liqd nasal spray kit Commonly known as: Acupuncturist or Respiratory Depression after opoid use   nortriptyline 50 MG capsule Commonly known as: PAMELOR Take by mouth.   promethazine 12.5 MG tablet Commonly known as: PHENERGAN Take by mouth.   tiZANidine 2 MG tablet Commonly known as: ZANAFLEX Take by mouth.   tolterodine 4 MG 24 hr capsule Commonly known as: DETROL LA TAKE 1 CAPSULE(4 MG) BY MOUTH DAILY. NO REFILLS. NEEDS AN APPT. LAST APPT WAS 08/2019.        History (reviewed): Past Medical History:  Diagnosis Date   Allergy    Anxiety    Cancer ([facility])    cancerous nodules on thyroid   Cancer (HCC)    basal cell Cancer-nose   Chronic pain syndrome    COPD (chronic obstructive pulmonary disease) (HCC)    Degeneration of lumbar or lumbosacral intervertebral disc    Depression    Facet syndrome, lumbar    GERD (gastroesophageal reflux disease)    Hyperlipidemia    Intervertebral lumbar disc disorder with myelopathy, lumbar region    Lumbosacral spondylosis without myelopathy    Migraine without aura, with intractable migraine, so stated, without mention of status migrainosus    Primary localized osteoarthrosis, lower leg    Thyroid disease    hypothyroid   Unspecified musculoskeletal disorders and symptoms referable to  neck    cervical/trapezius   Past Surgical History:  Procedure Laterality Date   ABDOMINAL HYSTERECTOMY  06/2002   Basel Cell Carcinoma  06/2005, 07/2005   nasal tip and reconstruction   BREAST CYST ASPIRATION     CARPAL TUNNEL RELEASE  07/1993   both hands   COLONOSCOPY     COSMETIC SURGERY  Basal cell carcinoma of nose   HEMORROIDECTOMY  08/2001   JOINT REPLACEMENT  Left knee replacement   KNEE ARTHROPLASTY  09/2001, 05/2003   left knee, chondromalasia   KNEE ARTHROSCOPY  02/2003   Right knee, tisse release, chrondromalacia   REPLACEMENT TOTAL KNEE  12/2003   Left    THYROID LOBECTOMY  01/2005   malignant area removed from right lobe   THYROID SURGERY     thyroid lobe removal   TONSILLECTOMY  09/1972   TUBAL LIGATION  10/1997   Ulnar nerve entrapment  03/1993  left elbow   Family History  Problem Relation Age of Onset   Depression Mother    Hypertension Mother    Anxiety disorder Mother    Obesity Mother    Varicose Veins Mother    Hypertension Father    Heart failure Father    Diabetes Father    Heart attack Father    Arthritis Father    Cancer Father    Obesity Father    Colon cancer Neg Hx    Esophageal cancer Neg Hx    Stomach cancer Neg Hx    Rectal cancer Neg Hx    Social History   Socioeconomic History   Marital status: Married    Spouse name: [name]   Number of children: 2   Years of education: some college   Highest education level: 12th grade  Occupational History   Occupation: On disability    Employer: UNEMPLOYED  Tobacco Use   Smoking status: Former    Packs/day: 0.00    Years: 43.00    Total pack years: 0.00    Types: Cigarettes    Quit date: 02/06/2016    Years since quitting: 5.9   Smokeless tobacco: Never  Vaping Use   Vaping Use: Never used  Substance and Sexual Activity   Alcohol use: No   Drug use: No   Sexual activity: Not Currently    Partners: Male    Birth control/protection: None  Other Topics Concern   Not on file   Social History Narrative   On disability. Lives with her husband. She has one adopted daughter. She likes to read in her free time. She does chair exercises for 30-45 mins everyday.   Social Determinants of Health   Financial Resource Strain: Low Risk  (01/16/2022)   Overall Financial Resource Strain (CARDIA)    Difficulty of Paying Living Expenses: Not very hard  Food Insecurity: No Food Insecurity (01/16/2022)   Hunger Vital Sign    Worried About Running Out of Food in the Last Year: Never true    Ran Out of Food in the Last Year: Never true  Transportation Needs: No Transportation Needs (01/16/2022)   PRAPARE - Hydrologist (Medical): No    Lack of Transportation (Non-Medical): No  Physical Activity: Inactive (01/16/2022)   Exercise Vital Sign    Days of Exercise per Week: 0 days    Minutes of Exercise per Session: 0 min  Stress: Stress Concern Present (01/16/2022)   [place]    Feeling of Stress : To some extent  Social Connections: Unknown (01/19/2022)   Social Connection and Isolation Panel [NHANES]    Frequency of Communication with Friends and Family: Patient refused    Frequency of Social Gatherings with Friends and Family: Patient refused    Attends Religious Services: Never    Marine scientist or Organizations: No    Attends Archivist Meetings: Never    Marital Status: Married    Activities of Daily Living    01/16/2022   11:22 AM  In your present state of health, do you have any difficulty performing the following activities:  Hearing? 0  Vision? 0  Difficulty concentrating or making decisions? 1  Walking or climbing stairs? 1  Dressing or bathing? 0  Doing errands, shopping? 1  Preparing Food and eating ? Y  Using the Toilet? N  In the past six months, have you accidently leaked urine? Frances Mclean  Do you have problems with loss of bowel control? N   Managing your Medications? N  Managing your Finances? N  Housekeeping or managing your Housekeeping? Y    Patient Education/ Literacy How often do you need to have someone help you when you read instructions, pamphlets, or other written materials from your doctor or pharmacy?: 1 - Never What is the last grade level you completed in school?: 12th grade  Exercise Current Exercise Habits: The patient does not participate in regular exercise at present, Exercise limited by: orthopedic condition(s)  Diet Patient reports consuming 1 meals a day and 1 snack(s) a day Patient reports that her primary diet is: Regular Patient reports that she does have regular access to food.   Depression Screen    01/19/2022    2:12 PM 01/10/2022    8:56 AM 12/15/2021    9:01 AM 11/16/2021    9:04 AM 09/21/2021    8:39 AM 08/24/2021    9:06 AM 07/27/2021   12:43 PM  PHQ 2/9 Scores  PHQ - 2 Score 6 0 _0 0 6  PHQ- 9 Score 16           Fall Risk    01/19/2022    2:12 PM 01/16/2022   11:22 AM 01/10/2022    8:56 AM 12/15/2021    9:01 AM 11/16/2021    9:04 AM  Fall Risk   Falls in the past year? 0 0 0 0 0  Number falls in past yr: 0  0  0  Injury with Fall? 0 0 0  0  Risk for fall due to : No Fall Risks      Follow up Falls evaluation completed         Objective:  [name] seemed alert and oriented and she participated appropriately during our telephone visit.  Blood Pressure Weight BMI  BP Readings from Last 3 Encounters:  01/10/22 122/77  12/15/21 109/75  11/16/21 121/82   Wt Readings from Last 3 Encounters:  01/10/22 224 lb (101.6 kg)  12/15/21 222 lb 12.8 oz (101.1 kg)  11/16/21 227 lb (103 kg)   BMI Readings from Last 1 Encounters:  01/10/22 35.08 kg/m    *Unable to obtain current vital signs, weight, and BMI due to telephone visit type  Hearing/Vision  [name] did not seem to have difficulty with hearing/understanding during the telephone conversation Reports  that she has had a formal eye exam by an eye care professional within the past year Reports that she has not had a formal hearing evaluation within the past year *Unable to fully assess hearing and vision during telephone visit type  Cognitive Function:    01/19/2022    2:16 PM 04/30/2020    8:34 AM  6CIT Screen  What Year? 0 points 0 points  What month? 0 points 0 points  What time? 0 points 3 points  Count back from 20 0 points 0 points  Months in reverse 0 points 0 points  Repeat phrase 0 points 0 points  Total Score 0 points 3 points   (Normal:0-7, Significant for Dysfunction: >8)  Normal Cognitive Function Screening: Yes   Immunization & Health Maintenance Record Immunization History  Administered Date(s) Administered   Pneumococcal Polysaccharide-23 04/20/2011    Health Maintenance  Topic Date Due   DTaP/Tdap/Td (1 - Tdap) Never done   COVID-19 Vaccine (1) 02/04/2022 (Originally 04/26/1965)   Zoster Vaccines- Shingrix (1 of 2) 04/20/2022 (Originally 04/25/1977)   INFLUENZA VACCINE  05/07/2022 (Originally 09/06/2021)   MAMMOGRAM  01/20/2023 (Originally 11/08/2019)   COLONOSCOPY (Pts 45-63yr Insurance coverage will need to be confirmed)  01/20/2023 (Originally 04/25/2021)   HIV Screening  01/20/2023 (Originally 04/25/1975)   Medicare Annual Wellness (AWV)  01/20/2023   Hepatitis C Screening  Completed   Pneumococcal Vaccine [id number] old  Aged Out   HPV VACCINES  Aged Out       Assessment  This is a routine wellness examination for [name]  Health Maintenance: Due or Overdue Health Maintenance Due  Topic Date Due   DTaP/Tdap/Td (1 - Tdap) Never done    CScot Jundoes not need a referral for Community Assistance: Care Management:   no Social Work:    no Prescription Assistance:  no Nutrition/Diabetes Education:  no   Plan:  Personalized Goals  Goals Addressed               This Visit's Progress     Patient Stated (pt-stated)         Patient would like to loose 50 lbs.       Personalized Health Maintenance & Screening Recommendations  Influenza vaccine Td vaccine Screening mammography Colorectal cancer screening Shingles vaccine  Patient declined influenza vaccine.  Lung Cancer Screening Recommended: no (Low Dose CT Chest recommended if Age 63-80years, 30 pack-year currently smoking OR have quit w/in past 15 years) Hepatitis C Screening recommended: no HIV Screening recommended: yes  Advanced Directives: Written information was not prepared per patient's request.  Referrals & Orders No orders of the defined types were placed in this encounter.   Follow-up Plan Follow-up with MHali Marry MD as planned Schedule tetanus and shingles vaccine at the pharmacy.  Patient would like to discuss mammogram and colorectal cancer screening with PCP. Medicare wellness visit in one year. Patient will access AVS on my chart.   I have personally reviewed and noted the following in the patient's chart:   Medical and social history Use of alcohol, tobacco or illicit drugs  Current medications and supplements Functional ability and status Nutritional status Physical activity Advanced directives List of other physicians Hospitalizations, surgeries, and ER visits in previous 12 months Vitals Screenings to include cognitive, depression, and falls Referrals and appointments  In addition, I have reviewed and discussed with CScot Juncertain preventive protocols, quality metrics, and best practice recommendations. A written personalized care plan for preventive services as well as general preventive health recommendations is available and can be mailed to the patient at her request.      BTinnie Gens RN BSN  01/19/2022

## 2022-02-07 ENCOUNTER — Ambulatory Visit (INDEPENDENT_AMBULATORY_CARE_PROVIDER_SITE_OTHER): Payer: Medicare Other | Admitting: Licensed Clinical Social Worker

## 2022-02-07 DIAGNOSIS — F331 Major depressive disorder, recurrent, moderate: Secondary | ICD-10-CM

## 2022-02-07 DIAGNOSIS — M5136 Other intervertebral disc degeneration, lumbar region: Secondary | ICD-10-CM | POA: Diagnosis not present

## 2022-02-07 DIAGNOSIS — F411 Generalized anxiety disorder: Secondary | ICD-10-CM | POA: Diagnosis not present

## 2022-02-07 DIAGNOSIS — G894 Chronic pain syndrome: Secondary | ICD-10-CM | POA: Diagnosis not present

## 2022-02-07 NOTE — Progress Notes (Signed)
Virtual Visit via Video Note  I connected with [name] on 02/07/22 at  8:00 AM EST by a video enabled telemedicine application and verified that I am speaking with the correct person using two identifiers.  Location: Patient: home Provider: office   I discussed the limitations of evaluation and management by telemedicine and the availability of in person appointments. The patient expressed understanding and agreed to proceed.   I discussed the assessment and treatment plan with the patient. The patient was provided an opportunity to ask questions and all were answered. The patient agreed with the plan and demonstrated an understanding of the instructions.   The patient was advised to call back or seek an in-person evaluation if the symptoms worsen or if the condition fails to improve as anticipated.  I provided 53 minutes of non-face-to-face time during this encounter.  THERAPIST PROGRESS NOTE  Session Time: 8:00 AM to 8:53 AM  Participation Level: Active  Behavioral Response: CasualAlertEuthymic  Type of Therapy: Individual Therapy  Treatment Goals addressed: Patient shares mood improves through interaction and therapy, this happens through positive topics, through working through stressors,   ProgressTowards Goals: Progressing-continue to work on treatment goals through processing of stressors through topics that are mood enhancing, worked on significant issue for patient to process feelings related to estrangement from daughter [name]  Interventions: Solution Focused, Strength-based, Supportive, and Other: coping  Summary: [name] is a 62 y.o. female who presents with went to [name]'s at [facility] they had three and half days which was nice. Came home on Christmas so [name] wouldn't be here on own. Always wore out with legs, knees feet swell up and takes a day and half to get them down. Spend most of the time on the chase. Two big dogs Elisha Headland and Daryll Brod and  Elisha Headland loves patient brought her every toy in the house. He is 923 lbs keeps licking her all over. They have new kitten the kitten driving the cat crazy. They do something when she is there that is go to [employer] don't go unless she is there. Got a huge blanket for Christmas so warm and soft. Dad said she was displaced mermaid got presents with that theme. Mermaid, polar bears and dolphins are her things. Dolphins are the [employer] creatures above [place]. Therapist talked about how interaction with animals is a nice experience. Talked about Christmas gifts they got each other. Stayed up for New Year's has insomnia more than sleeping pattern too tired to go to sleep. Talked about going to see [name] had a great time love to see her girls [name] is her bestfriend. Miss [name] but [name] wrote a horrible letter about not wanted to be in patient's life made [name] and her closer. Can't imagine not having [name] to get through [employer]. She comes to mind a lot during the holidays any time. Always get teary eyed like now. It is like she died never going to see her again might never talk to her again. Saying in letter trying to get away from her as kid still can't understand that.  Therapist talked about how that impacts the person who cannot forgive but patient says she is moving  on without patient. Doesn't make any sense. Therapist pointed out she won't even talk to her to get an explanation. Patient says can't imagine what did so bad shield against her father, sway things in her favor as far as twins keep the punishment down.  Life with  her stopped at certain point and like she died. [name] and [name] never close didn't get together often had a lot in common but never got together on stuff.  Therapist noted none of this with [name] in her control can only move on in her life at the same time therapist assesses psychologically it is in the research that lack of forgiveness does impact the person who does not forgive.        Therapist processed thoughts and feelings provided space and support talked about the holidays therapist feedback was proud of patient she drove to see family at [facility].  Asked her how it was she said she is always happy when she gets to see her girls.  Session also included processing feelings about estrangement from daughter [name].  Patient working to difficult feelings shares difficult for her therapist assesses allowing yourself to feel also helpful for coping even though difficult.  Patient shared helps to be around her girls helps with coping with this distance relationship with [name].  Therapist provided her perspective that lack of forgiveness also forms the person who does not forgive although validated patient she is the one who suffers from this estrangement.  We were able to talk about other things more positive such as the theme of mermaid patient's life she will get gifts and related her favorites are polar bear, dolphins and mermaid therapist in agreement that these 2 animal standout.  Therapist provided active listening open questions supportive interventions. Suicidal/Homicidal: No  Plan: Return again in 2 weeks.2.Patient processed thoughts and feelings to help with coping major depressive disorder, recurrent, moderate, degenerative disc disease, chronic pain syndrome, generalized anxiety disorder Diagnosis:   Collaboration of Care: Other none needed  Patient/Guardian was advised Release of Information must be obtained prior to any record release in order to collaborate their care with an outside provider. Patient/Guardian was advised if they have not already done so to contact the registration department to sign all necessary forms in order for [place] to release information regarding their care.   Consent: Patient/Guardian gives verbal consent for treatment and assignment of benefits for services provided during this visit. Patient/Guardian expressed understanding and agreed to  proceed.   [name], LCSW 02/07/2022

## 2022-02-08 ENCOUNTER — Encounter: Payer: Medicare Other | Attending: Physical Medicine and Rehabilitation | Admitting: Registered Nurse

## 2022-02-08 VITALS — BP 118/88 | HR 79 | Ht 67.0 in | Wt 237.0 lb

## 2022-02-08 DIAGNOSIS — M7061 Trochanteric bursitis, right hip: Secondary | ICD-10-CM | POA: Diagnosis not present

## 2022-02-08 DIAGNOSIS — G894 Chronic pain syndrome: Secondary | ICD-10-CM | POA: Diagnosis not present

## 2022-02-08 DIAGNOSIS — Z79899 Other long term (current) drug therapy: Secondary | ICD-10-CM | POA: Diagnosis not present

## 2022-02-08 DIAGNOSIS — M542 Cervicalgia: Secondary | ICD-10-CM | POA: Diagnosis not present

## 2022-02-08 DIAGNOSIS — M1711 Unilateral primary osteoarthritis, right knee: Secondary | ICD-10-CM

## 2022-02-08 DIAGNOSIS — G8929 Other chronic pain: Secondary | ICD-10-CM | POA: Insufficient documentation

## 2022-02-08 DIAGNOSIS — G9050 Complex regional pain syndrome I, unspecified: Secondary | ICD-10-CM | POA: Diagnosis not present

## 2022-02-08 DIAGNOSIS — M47816 Spondylosis without myelopathy or radiculopathy, lumbar region: Secondary | ICD-10-CM | POA: Diagnosis not present

## 2022-02-08 DIAGNOSIS — Z79891 Long term (current) use of opiate analgesic: Secondary | ICD-10-CM | POA: Insufficient documentation

## 2022-02-08 DIAGNOSIS — M7062 Trochanteric bursitis, left hip: Secondary | ICD-10-CM | POA: Diagnosis not present

## 2022-02-08 DIAGNOSIS — Z5181 Encounter for therapeutic drug level monitoring: Secondary | ICD-10-CM | POA: Diagnosis not present

## 2022-02-08 DIAGNOSIS — M546 Pain in thoracic spine: Secondary | ICD-10-CM | POA: Diagnosis not present

## 2022-02-08 MED ORDER — MORPHINE SULFATE ER 30 MG PO TBCR: 30.0000 mg | EXTENDED_RELEASE_TABLET | Freq: Two times a day (BID) | ORAL | 0 refills | Status: DC

## 2022-02-08 MED ORDER — MORPHINE SULFATE 15 MG PO TABS: 15.0000 mg | ORAL_TABLET | Freq: Four times a day (QID) | ORAL | 0 refills | Status: DC | PRN

## 2022-02-08 MED ORDER — MORPHINE SULFATE ER 60 MG PO TBCR: 60.0000 mg | EXTENDED_RELEASE_TABLET | Freq: Two times a day (BID) | ORAL | 0 refills | Status: DC

## 2022-02-08 NOTE — Progress Notes (Signed)
Subjective:    Patient ID: [name], female    DOB: 02-19-1960, 62 y.o.   MRN: [id number]  HPI: [name] is a 62 y.o. female who returns for follow up appointment for chronic pain and medication refill. She states her pain is located in her neck, mid- back mainly left side and lower back pain. She also reports right knee pain. She rates her pain 8. Her current exercise regime is walking and performing stretching exercises.  [name] Morphine equivalent is 230.00 MME.   Last Oral Swab was Performed on 12/15/2021, it was consistent.     Pain Inventory Average Pain 8 Pain Right Now 8 My pain is constant, sharp, burning, dull, stabbing, tingling, and aching  In the last 24 hours, has pain interfered with the following? General activity 10 Relation with others 10 Enjoyment of life 10 What TIME of day is your pain at its worst? varies Sleep (in general) Poor  Pain is worse with: walking, bending, standing, and some activites Pain improves with: rest, heat/ice, therapy/exercise, pacing activities, medication, TENS, and injections Relief from Meds: 6  Family History  Problem Relation Age of Onset   Depression Mother    Hypertension Mother    Anxiety disorder Mother    Obesity Mother    Varicose Veins Mother    Hypertension Father    Heart failure Father    Diabetes Father    Heart attack Father    Arthritis Father    Cancer Father    Obesity Father    Colon cancer Neg Hx    Esophageal cancer Neg Hx    Stomach cancer Neg Hx    Rectal cancer Neg Hx    Social History   Socioeconomic History   Marital status: Married    Spouse name: [name]   Number of children: 2   Years of education: some college   Highest education level: 12th grade  Occupational History   Occupation: On disability    Employer: UNEMPLOYED  Tobacco Use   Smoking status: Former    Packs/day: 0.00    Years: 43.00    Total pack years: 0.00    Types: Cigarettes    Quit  date: 02/06/2016    Years since quitting: 6.0   Smokeless tobacco: Never  Vaping Use   Vaping Use: Never used  Substance and Sexual Activity   Alcohol use: No   Drug use: No   Sexual activity: Not Currently    Partners: Male    Birth control/protection: None  Other Topics Concern   Not on file  Social History Narrative   On disability. Lives with her husband. She has one adopted daughter. She likes to read in her free time. She does chair exercises for 30-45 mins everyday.   Social Determinants of Health   Financial Resource Strain: Low Risk  (01/16/2022)   Overall Financial Resource Strain (CARDIA)    Difficulty of Paying Living Expenses: Not very hard  Food Insecurity: No Food Insecurity (01/16/2022)   Hunger Vital Sign    Worried About Running Out of Food in the Last Year: Never true    Ran Out of Food in the Last Year: Never true  Transportation Needs: No Transportation Needs (01/16/2022)   PRAPARE - Hydrologist (Medical): No    Lack of Transportation (Non-Medical): No  Physical Activity: Inactive (01/16/2022)   Exercise Vital Sign    Days of Exercise per Week: 0 days  Minutes of Exercise per Session: 0 min  Stress: Stress Concern Present (01/16/2022)   [name]    Feeling of Stress : To some extent  Social Connections: Unknown (01/19/2022)   Social Connection and Isolation Panel [NHANES]    Frequency of Communication with Friends and Family: Patient refused    Frequency of Social Gatherings with Friends and Family: Patient refused    Attends Religious Services: Never    Marine scientist or Organizations: No    Attends Music therapist: Never    Marital Status: Married   Past Surgical History:  Procedure Laterality Date   ABDOMINAL HYSTERECTOMY  06/2002   Basel Cell Carcinoma  06/2005, 07/2005   nasal tip and reconstruction   BREAST CYST ASPIRATION      CARPAL TUNNEL RELEASE  07/1993   both hands   COLONOSCOPY     COSMETIC SURGERY  Basal cell carcinoma of nose   HEMORROIDECTOMY  08/2001   JOINT REPLACEMENT  Left knee replacement   KNEE ARTHROPLASTY  09/2001, 05/2003   left knee, chondromalasia   KNEE ARTHROSCOPY  02/2003   Right knee, tisse release, chrondromalacia   REPLACEMENT TOTAL KNEE  12/2003   Left    THYROID LOBECTOMY  01/2005   malignant area removed from right lobe   THYROID SURGERY     thyroid lobe removal   TONSILLECTOMY  09/1972   TUBAL LIGATION  10/1997   Ulnar nerve entrapment  03/1993   left elbow   Past Surgical History:  Procedure Laterality Date   ABDOMINAL HYSTERECTOMY  06/2002   Basel Cell Carcinoma  06/2005, 07/2005   nasal tip and reconstruction   BREAST CYST ASPIRATION     CARPAL TUNNEL RELEASE  07/1993   both hands   COLONOSCOPY     COSMETIC SURGERY  Basal cell carcinoma of nose   HEMORROIDECTOMY  08/2001   JOINT REPLACEMENT  Left knee replacement   KNEE ARTHROPLASTY  09/2001, 05/2003   left knee, chondromalasia   KNEE ARTHROSCOPY  02/2003   Right knee, tisse release, chrondromalacia   REPLACEMENT TOTAL KNEE  12/2003   Left    THYROID LOBECTOMY  01/2005   malignant area removed from right lobe   THYROID SURGERY     thyroid lobe removal   TONSILLECTOMY  09/1972   TUBAL LIGATION  10/1997   Ulnar nerve entrapment  03/1993   left elbow   Past Medical History:  Diagnosis Date   Allergy    Anxiety    Cancer ([place])    cancerous nodules on thyroid   Cancer ([place])    basal cell Cancer-nose   Chronic pain syndrome    COPD (chronic obstructive pulmonary disease) ([place])    Degeneration of lumbar or lumbosacral intervertebral disc    Depression    Facet syndrome, lumbar    GERD (gastroesophageal reflux disease)    Hyperlipidemia    Intervertebral lumbar disc disorder with myelopathy, lumbar region    Lumbosacral spondylosis without myelopathy    Migraine without aura, with intractable migraine,  so stated, without mention of status migrainosus    Primary localized osteoarthrosis, lower leg    Thyroid disease    hypothyroid   Unspecified musculoskeletal disorders and symptoms referable to neck    cervical/trapezius   BP 118/88   Pulse 79   Ht '5\' 7"'$  (1.702 m)   Wt 237 lb (107.5 kg)   SpO2 92%   BMI 37.12 kg/m  Opioid Risk Score:   Fall Risk Score:  `1  Depression screen PHQ 2/9     01/19/2022    2:12 PM 01/10/2022    8:56 AM 12/15/2021    9:01 AM 11/16/2021    9:04 AM 09/21/2021    8:39 AM 08/24/2021    9:06 AM 07/27/2021   12:43 PM  Depression screen PHQ 2/9  Decreased Interest 3 0 '1 1 1 '$ 0 3  Down, Depressed, Hopeless 3 0 '1 1 1 '$ 0 3  PHQ - 2 Score 6 0 '2 2 2 '$ 0 6  Altered sleeping 3        Tired, decreased energy 3        Change in appetite 1        Feeling bad or failure about yourself  1        Trouble concentrating 1        Moving slowly or fidgety/restless 1        Suicidal thoughts 0        PHQ-9 Score 16        Difficult doing work/chores Extremely dIfficult           Review of Systems  Musculoskeletal:  Positive for gait problem and neck pain.       Bilateral joint pain  All other systems reviewed and are negative.     Objective:   Physical Exam Vitals and nursing note reviewed.  Constitutional:      Appearance: Normal appearance.  Cardiovascular:     Rate and Rhythm: Normal rate and regular rhythm.     Pulses: Normal pulses.     Heart sounds: Normal heart sounds.  Pulmonary:     Effort: Pulmonary effort is normal.     Breath sounds: Normal breath sounds.  Musculoskeletal:     Cervical back: Normal range of motion and neck supple.     Comments: Normal Muscle Bulk and Muscle Testing Reveals:  Upper Extremities: Full ROM and Muscle Strength 5/5 Thoracic  Paraspinal Tenderness: T-7-T-9 Mainly Left Side Lumbar Paraspinal Tenderness: L-3-L-5 Right Greater Trochanter Tenderness Lower Extremities : Full ROM and Muscle Strength 5/5 Right Lower  Extremity Flexion Produces Pain into her Right Patella Arises from Table slowly using cane for support Narrow Based Gait     Skin:    General: Skin is warm and dry.  Neurological:     Mental Status: She is alert and oriented to person, place, and time.  Psychiatric:        Mood and Affect: Mood normal.        Behavior: Behavior normal.         Assessment & Plan:  1.Chronic Bilateral Thoracic Back Pain/ Low Back Pain/ Lumbar Facet Arthropathy: Refilled MSIR 15 mg one tablet every 6 hours as needed #100 and   Refilled: MS Contin 60 mg and MS Contin  30 mg Q 12 hours to equal   90 mg  #60/60.Marland Kitchen Continue Gabapentin and Pamelor. 02/08/2022 We will continue the opioid monitoring program, this consists of regular clinic visits, examinations, urine drug screen, pill counts as well as use of [place] Controlled Substance Reporting system. A 12 month History has been reviewed on the [facility] on 02/08/2022. 2. Degenerative Disk Disease:Encouraged Continue HEP as Tolertated  and heat therapy. Continue Current Medication Regime.02/08/2022 3. Bilateral Greater Trochanteric Bursitis:  Continue current treatment regimen with Heat and Ice Therapy. 02/08/2022 4. Osteoarthritis of Bilateral Knees: R>L Continue current treatment modality  with home exercise program and heat therapy. 02/08/2022 5. Migraine Headaches: Continue current medication regimen  Continue with  Migraine Journal. Aimovig discontinued due to Hives. Continue Lamictal and Relpax. Continue to Monitor.  02/08/2022 6. Smoking Cessation: She has quit smoking since 08/25/2016. Continue to Monitor. 02/08/2022 7.Constipation: Continue  Current medication regime with Linzess. 02/08/2022 8. Muscle Spasm: Continue current medication regime with Tizanidine.02/08/2022 9. DepressionAnxiety/ Panic Attacks: Continue current medication regimen and treatment modality.  Celexa.Lamictal  and  Counseling with  [name] and  Dr. De Nurse Psychiatrist. 02/08/2022 10. RSD: Continue with current medication Regime with Gabapentin. 02/08/2022   F/U in 1 month

## 2022-02-12 ENCOUNTER — Encounter: Payer: Self-pay | Admitting: Registered Nurse

## 2022-02-14 ENCOUNTER — Encounter: Payer: Medicare Other | Admitting: Registered Nurse

## 2022-02-20 ENCOUNTER — Ambulatory Visit (INDEPENDENT_AMBULATORY_CARE_PROVIDER_SITE_OTHER): Payer: Medicare Other | Admitting: Licensed Clinical Social Worker

## 2022-02-20 DIAGNOSIS — F331 Major depressive disorder, recurrent, moderate: Secondary | ICD-10-CM | POA: Diagnosis not present

## 2022-02-20 DIAGNOSIS — F411 Generalized anxiety disorder: Secondary | ICD-10-CM

## 2022-02-20 DIAGNOSIS — G894 Chronic pain syndrome: Secondary | ICD-10-CM

## 2022-02-20 DIAGNOSIS — M5136 Other intervertebral disc degeneration, lumbar region: Secondary | ICD-10-CM

## 2022-02-20 NOTE — Progress Notes (Signed)
Virtual Visit via Video Note  I connected with [name] on 02/20/22 at  8:00 AM EST by a video enabled telemedicine application and verified that I am speaking with the correct person using two identifiers.  Location: Patient: home Provider: office   I discussed the limitations of evaluation and management by telemedicine and the availability of in person appointments. The patient expressed understanding and agreed to proceed.   I discussed the assessment and treatment plan with the patient. The patient was provided an opportunity to ask questions and all were answered. The patient agreed with the plan and demonstrated an understanding of the instructions.   The patient was advised to call back or seek an in-person evaluation if the symptoms worsen or if the condition fails to improve as anticipated.  I provided 50 minutes of non-face-to-face time during this encounter.   THERAPIST PROGRESS NOTE  Session Time: 8:00 AM to 8:50 AM  Participation Level: Active  Behavioral Response: CasualAlertappropriate  Type of Therapy: Individual Therapy  Treatment Goals addressed: Patient shares mood improves through interaction and therapy, this happens through positive topics, through working through stressors,   ProgressTowards Goals: Progressing-therapy if active for mood enhancement as well as processing thoughts and feelings related to stressors  Interventions: Solution Focused, Strength-based, Supportive, and Other: coping  Summary: [name] is a 63 y.o. female who presents with up all night so really late for her whereas for therapist its first thing in the morning. Patient says now more all nights than all days. Does things like on app for [employer], computer, television or read. But eyes get  tired but mind is going. Looking at [employer] she has something going on with it  this morning. A community of homeless   people asking people if they have jackets and things  they don't need. People going over every night where there is a community supposed to be really cold this week. People are donating and patient is going to get a bunch of things for them. Therapist noted that says a lot about people getting together to help them homeless. Patient made a Special educational needs teacher and made the comment in general should always try to be nice to neighbors. A girl saw a feed and that is what got her going. Put it together a bunch of people. Doesn't know why on mind maybe the holidays. Patient talks to people on this app to people everywhere. Just got it together yesterday afternoon and last night got this together. Although made the post before christmas a comment to patient is what got them going.  Transition to other topics talked about growing up having [place] in their [place], was on swim team. After school go swimming. Talked about [name] that helps them out like family. Stays with patient every now and then. Transitioned to talking about healthcare and fortunate with insurance has a secondary [employer]. Has to call PCP has a lot of lipoma coming up on arms and neck come up in clumps that take out if get big. Discussed gaining weight over christmas and have to take off. Quit the snacks and sucking on lollipop helps not eat as much.  Reviewed session therapist pointed out appreciate patient's kindness and patient shares that it makes her feel good.  Therapist provided space and support for patient to talk about thoughts and feelings noting patient's kindness looking out for homeless during the cold weather and her thoughtfulness to think about that.  Therapist expressed appreciation and  patient herself said helps her feel good as well.  Talked about [employer] as a good app for connection with people and it appears to be doing good work as well.  Session covered different topics assess positive for patient to talk about old memories as they enhance mood talked about appreciation for her  insurance also talked about needing to lose weight therapist pointed out she came up with a unique way that works for her.  Therapist provided active listening open questions supportive interventions.  Suicidal/Homicidal: No  Plan: Return again in 2 weeks.2.Patient processed thoughts and feelings to help with coping  Diagnosis: major depressive disorder, recurrent, moderate, degenerative disc disease, chronic pain syndrome, generalized anxiety disorder   Collaboration of Care: Other none needed  Patient/Guardian was advised Release of Information must be obtained prior to any record release in order to collaborate their care with an outside provider. Patient/Guardian was advised if they have not already done so to contact the registration department to sign all necessary forms in order for [place] to release information regarding their care.   Consent: Patient/Guardian gives verbal consent for treatment and assignment of benefits for services provided during this visit. Patient/Guardian expressed understanding and agreed to proceed.   [name], LCSW 02/20/2022

## 2022-03-06 ENCOUNTER — Ambulatory Visit (INDEPENDENT_AMBULATORY_CARE_PROVIDER_SITE_OTHER): Payer: Medicare Other | Admitting: Licensed Clinical Social Worker

## 2022-03-06 DIAGNOSIS — F411 Generalized anxiety disorder: Secondary | ICD-10-CM | POA: Diagnosis not present

## 2022-03-06 DIAGNOSIS — F331 Major depressive disorder, recurrent, moderate: Secondary | ICD-10-CM | POA: Diagnosis not present

## 2022-03-06 DIAGNOSIS — M5136 Other intervertebral disc degeneration, lumbar region: Secondary | ICD-10-CM | POA: Diagnosis not present

## 2022-03-06 DIAGNOSIS — G894 Chronic pain syndrome: Secondary | ICD-10-CM

## 2022-03-06 NOTE — Progress Notes (Signed)
Virtual Visit via Video Note  I connected with [name] on 03/06/22 at  8:00 AM EST by a video enabled telemedicine application and verified that I am speaking with the correct person using two identifiers.  Location: Patient: home Provider: office   I discussed the limitations of evaluation and management by telemedicine and the availability of in person appointments. The patient expressed understanding and agreed to proceed.    I discussed the assessment and treatment plan with the patient. The patient was provided an opportunity to ask questions and all were answered. The patient agreed with the plan and demonstrated an understanding of the instructions.   The patient was advised to call back or seek an in-person evaluation if the symptoms worsen or if the condition fails to improve as anticipated.  I provided 45 minutes of non-face-to-face time during this encounter.  THERAPIST PROGRESS NOTE  Session Time: 8:00 AM to 8:45 AM  Participation Level: Active  Behavioral Response: CasualAlertappropriate  Type of Therapy: Individual Therapy  Treatment Goals addressed: Patient shares mood improves through interaction and therapy, this happens through positive topics, through working through stressors,   ProgressTowards Goals: Progressing-mood enhancement through various topics as well as strength-based and supportive therapist pointing out different ways she and her husband have been generous and kind in different activities  Interventions: Solution Focused, Strength-based, Supportive, and Other: coping  Summary: [name] is a 62 y.o. female who presents with was up until about 4 AM. Patient said [name] doing a lot of weird things like early in the morning jumping on her chest. He is scaring her has seizures more fits incontinent poop just falling out like no control over it. Not going on papers, pees but not poop. Looked at incontinence for older dogs sign of  impending death didn't like hearing that. Little dogs can live to 20 years if taken care of. Was at the vet last week for shots found out hook worms gave him something for that. Weirdest dog with behaviors backwards to what is normal. Trying to get him to eat dog food mixing it with people food. Talked about their other dog as well. [name] loves to run and play and [name] takes them out and they love her. [name]  was [employer] and outside all the time she was in a mud hole every time it rained. [name] felt bad and wanted to take over her care. Patient said yes it was so sad what was going on with her. [name] knows she is in a good place. She loves [name]. [name] also feeds a bunch of feral cats. [name] wants to play with them and they scatter. Therapist pointed out they did a good thing rescuing her.  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  Returned to subject of last time and her efforts along with another woman from [employer] to get things together for the husband. There was a colony behind hardware store. A bunch of people donating. It worked out well they were very well.       Therapist assesses sessions enhance mood as patient shares about stories of her life about current issues.  Therapist provided space and support for patient to talk about thoughts and feelings to help her in managing current stressors which is concerned about her dog.  Therapist noted in session ways patient has been thoughtful and generous pointing out donating to homeless and getting the whole  event going adopting dog from [employer] who was not being treated well.  Therapist wanted to bring this to patient's attention to realize all the good she has been doing therapist provided active listening open questions supportive interventions Suicidal/Homicidal: No  Plan: Return again in 2 weeks.2.Patient process thoughts and feelings to help with coping  Diagnosis: major  depressive disorder, recurrent, moderate, degenerative disc disease, chronic pain syndrome, generalized anxiety disorder  Collaboration of Care: Other none needed  Patient/Guardian was advised Release of Information must be obtained prior to any record release in order to collaborate their care with an outside provider. Patient/Guardian was advised if they have not already done so to contact the registration department to sign all necessary forms in order for [place] to release information regarding their care.   Consent: Patient/Guardian gives verbal consent for treatment and assignment of benefits for services provided during this visit. Patient/Guardian expressed understanding and agreed to proceed.   [name], LCSW 03/06/2022

## 2022-03-10 ENCOUNTER — Encounter: Payer: Medicare Other | Attending: Physical Medicine and Rehabilitation | Admitting: Registered Nurse

## 2022-03-10 ENCOUNTER — Encounter: Payer: Self-pay | Admitting: Registered Nurse

## 2022-03-10 VITALS — BP 110/76 | HR 100 | Ht 67.0 in | Wt 234.0 lb

## 2022-03-10 DIAGNOSIS — M1712 Unilateral primary osteoarthritis, left knee: Secondary | ICD-10-CM | POA: Diagnosis not present

## 2022-03-10 DIAGNOSIS — G894 Chronic pain syndrome: Secondary | ICD-10-CM | POA: Insufficient documentation

## 2022-03-10 DIAGNOSIS — M1711 Unilateral primary osteoarthritis, right knee: Secondary | ICD-10-CM | POA: Diagnosis not present

## 2022-03-10 DIAGNOSIS — M47816 Spondylosis without myelopathy or radiculopathy, lumbar region: Secondary | ICD-10-CM | POA: Diagnosis not present

## 2022-03-10 DIAGNOSIS — Z79891 Long term (current) use of opiate analgesic: Secondary | ICD-10-CM | POA: Diagnosis not present

## 2022-03-10 DIAGNOSIS — M546 Pain in thoracic spine: Secondary | ICD-10-CM | POA: Diagnosis not present

## 2022-03-10 DIAGNOSIS — G8929 Other chronic pain: Secondary | ICD-10-CM | POA: Diagnosis not present

## 2022-03-10 DIAGNOSIS — M2550 Pain in unspecified joint: Secondary | ICD-10-CM

## 2022-03-10 DIAGNOSIS — Z79899 Other long term (current) drug therapy: Secondary | ICD-10-CM | POA: Diagnosis not present

## 2022-03-10 DIAGNOSIS — G9050 Complex regional pain syndrome I, unspecified: Secondary | ICD-10-CM

## 2022-03-10 DIAGNOSIS — Z5181 Encounter for therapeutic drug level monitoring: Secondary | ICD-10-CM | POA: Diagnosis not present

## 2022-03-10 MED ORDER — MORPHINE SULFATE ER 30 MG PO TBCR: 30.0000 mg | EXTENDED_RELEASE_TABLET | Freq: Two times a day (BID) | ORAL | 0 refills | Status: DC

## 2022-03-10 MED ORDER — MORPHINE SULFATE 15 MG PO TABS: 15.0000 mg | ORAL_TABLET | Freq: Four times a day (QID) | ORAL | 0 refills | Status: DC | PRN

## 2022-03-10 MED ORDER — MORPHINE SULFATE ER 60 MG PO TBCR: 60.0000 mg | EXTENDED_RELEASE_TABLET | Freq: Two times a day (BID) | ORAL | 0 refills | Status: DC

## 2022-03-10 NOTE — Progress Notes (Signed)
Subjective:    Patient ID: [name], female    DOB: Sep 04, 1960, 62 y.o.   MRN: [id number]  HPI: [name] is a 62 y.o. female who returns for follow up appointment for chronic pain and medication refill. She states her pain is located in her mid- lower back, bilateral knee pain and generalized joint pain.. She rates her pain 8. Her current exercise regime is walking and performing stretching exercises.  Ms. Medaglia Morphine equivalent is 240.00 MME.   Last oral swab was performed on 12/15/2021, it was consistent.    Pain Inventory Average Pain 8 Pain Right Now 8 My pain is constant, sharp, burning, dull, stabbing, tingling, and aching  In the last 24 hours, has pain interfered with the following? General activity 10 Relation with others 10 Enjoyment of life 10 What TIME of day is your pain at its worst? morning , daytime, evening, and night Sleep (in general) Poor  Pain is worse with: walking, bending, standing, and some activites Pain improves with: rest, medication, TENS, injections, and heat, therapy Relief from Meds: 6  Family History  Problem Relation Age of Onset   Depression Mother    Hypertension Mother    Anxiety disorder Mother    Obesity Mother    Varicose Veins Mother    Hypertension Father    Heart failure Father    Diabetes Father    Heart attack Father    Arthritis Father    Cancer Father    Obesity Father    Colon cancer Neg Hx    Esophageal cancer Neg Hx    Stomach cancer Neg Hx    Rectal cancer Neg Hx    Social History   Socioeconomic History   Marital status: Married    Spouse name: [name]   Number of children: 2   Years of education: some college   Highest education level: 12th grade  Occupational History   Occupation: On disability    Employer: UNEMPLOYED  Tobacco Use   Smoking status: Former    Packs/day: 0.00    Years: 43.00    Total pack years: 0.00    Types: Cigarettes    Quit date: 02/06/2016    Years  since quitting: 6.0   Smokeless tobacco: Never  Vaping Use   Vaping Use: Never used  Substance and Sexual Activity   Alcohol use: No   Drug use: No   Sexual activity: Not Currently    Partners: Male    Birth control/protection: None  Other Topics Concern   Not on file  Social History Narrative   On disability. Lives with her husband. She has one adopted daughter. She likes to read in her free time. She does chair exercises for 30-45 mins everyday.   Social Determinants of Health   Financial Resource Strain: Low Risk  (01/16/2022)   Overall Financial Resource Strain (CARDIA)    Difficulty of Paying Living Expenses: Not very hard  Food Insecurity: No Food Insecurity (01/16/2022)   Hunger Vital Sign    Worried About Running Out of Food in the Last Year: Never true    Ran Out of Food in the Last Year: Never true  Transportation Needs: No Transportation Needs (01/16/2022)   PRAPARE - Hydrologist (Medical): No    Lack of Transportation (Non-Medical): No  Physical Activity: Inactive (01/16/2022)   Exercise Vital Sign    Days of Exercise per Week: 0 days    Minutes of Exercise  per Session: 0 min  Stress: Stress Concern Present (01/16/2022)   [place]    Feeling of Stress : To some extent  Social Connections: Unknown (01/19/2022)   Social Connection and Isolation Panel [NHANES]    Frequency of Communication with Friends and Family: Patient refused    Frequency of Social Gatherings with Friends and Family: Patient refused    Attends Religious Services: Never    Marine scientist or Organizations: No    Attends Music therapist: Never    Marital Status: Married   Past Surgical History:  Procedure Laterality Date   ABDOMINAL HYSTERECTOMY  06/2002   Basel Cell Carcinoma  06/2005, 07/2005   nasal tip and reconstruction   BREAST CYST ASPIRATION     CARPAL TUNNEL RELEASE   07/1993   both hands   COLONOSCOPY     COSMETIC SURGERY  Basal cell carcinoma of nose   HEMORROIDECTOMY  08/2001   JOINT REPLACEMENT  Left knee replacement   KNEE ARTHROPLASTY  09/2001, 05/2003   left knee, chondromalasia   KNEE ARTHROSCOPY  02/2003   Right knee, tisse release, chrondromalacia   REPLACEMENT TOTAL KNEE  12/2003   Left    THYROID LOBECTOMY  01/2005   malignant area removed from right lobe   THYROID SURGERY     thyroid lobe removal   TONSILLECTOMY  09/1972   TUBAL LIGATION  10/1997   Ulnar nerve entrapment  03/1993   left elbow   Past Surgical History:  Procedure Laterality Date   ABDOMINAL HYSTERECTOMY  06/2002   Basel Cell Carcinoma  06/2005, 07/2005   nasal tip and reconstruction   BREAST CYST ASPIRATION     CARPAL TUNNEL RELEASE  07/1993   both hands   COLONOSCOPY     COSMETIC SURGERY  Basal cell carcinoma of nose   HEMORROIDECTOMY  08/2001   JOINT REPLACEMENT  Left knee replacement   KNEE ARTHROPLASTY  09/2001, 05/2003   left knee, chondromalasia   KNEE ARTHROSCOPY  02/2003   Right knee, tisse release, chrondromalacia   REPLACEMENT TOTAL KNEE  12/2003   Left    THYROID LOBECTOMY  01/2005   malignant area removed from right lobe   THYROID SURGERY     thyroid lobe removal   TONSILLECTOMY  09/1972   TUBAL LIGATION  10/1997   Ulnar nerve entrapment  03/1993   left elbow   Past Medical History:  Diagnosis Date   Allergy    Anxiety    Cancer ([place])    cancerous nodules on thyroid   Cancer ([place])    basal cell Cancer-nose   Chronic pain syndrome    COPD (chronic obstructive pulmonary disease) (HCC)    Degeneration of lumbar or lumbosacral intervertebral disc    Depression    Facet syndrome, lumbar    GERD (gastroesophageal reflux disease)    Hyperlipidemia    Intervertebral lumbar disc disorder with myelopathy, lumbar region    Lumbosacral spondylosis without myelopathy    Migraine without aura, with intractable migraine, so stated, without mention  of status migrainosus    Primary localized osteoarthrosis, lower leg    Thyroid disease    hypothyroid   Unspecified musculoskeletal disorders and symptoms referable to neck    cervical/trapezius   There were no vitals taken for this visit.  Opioid Risk Score:   Fall Risk Score:  `1  Depression screen [facility] 2/9     01/19/2022  2:12 PM 01/10/2022    8:56 AM 12/15/2021    9:01 AM 11/16/2021    9:04 AM 09/21/2021    8:39 AM 08/24/2021    9:06 AM 07/27/2021   12:43 PM  Depression screen PHQ 2/9  Decreased Interest 3 0 '1 1 1 '$ 0 3  Down, Depressed, Hopeless 3 0 '1 1 1 '$ 0 3  PHQ - 2 Score 6 0 '2 2 2 '$ 0 6  Altered sleeping 3        Tired, decreased energy 3        Change in appetite 1        Feeling bad or failure about yourself  1        Trouble concentrating 1        Moving slowly or fidgety/restless 1        Suicidal thoughts 0        PHQ-9 Score 16        Difficult doing work/chores Extremely dIfficult          Review of Systems  Musculoskeletal:  Positive for arthralgias, back pain and gait problem.       Pain in both knees, arms, shoulders, legs & feet  All other systems reviewed and are negative.      Objective:   Physical Exam Vitals and nursing note reviewed.  Constitutional:      Appearance: Normal appearance.  Cardiovascular:     Rate and Rhythm: Normal rate and regular rhythm.     Pulses: Normal pulses.     Heart sounds: Normal heart sounds.  Pulmonary:     Effort: Pulmonary effort is normal.     Breath sounds: Normal breath sounds.  Musculoskeletal:     Cervical back: Normal range of motion and neck supple.     Comments: Normal Muscle Bulk and Muscle Testing Reveals:  Upper Extremities: Full ROM and Muscle Strength 5/5  Thoracic Paraspinal Tenderness: T-7-T-9 Mainly Left  Side Lumbar Paraspinal Tenderness: L-4-L-5 Lower Extremities: Full ROM and Muscle Strength 5/5 Bilateral Lower Extremities Flexion Produces Pin into her Bilateral Patella's Arises from  Table slowly using cane for support Antalgic  Gait     Skin:    General: Skin is warm and dry.  Neurological:     Mental Status: She is alert and oriented to person, place, and time.  Psychiatric:        Mood and Affect: Mood normal.        Behavior: Behavior normal.         Assessment & Plan:  1.Chronic Bilateral Thoracic Back Pain/ Low Back Pain/ Lumbar Facet Arthropathy: Refilled MSIR 15 mg one tablet every 6 hours as needed #100 and   Refilled: MS Contin 60 mg and MS Contin  30 mg Q 12 hours to equal   90 mg  #60/60.Marland Kitchen Continue Gabapentin and Pamelor. 03/10/2022 We will continue the opioid monitoring program, this consists of regular clinic visits, examinations, urine drug screen, pill counts as well as use of [place] Controlled Substance Reporting system. A 12 month History has been reviewed on the [place] on 03/10/2022. 2. Degenerative Disk Disease:Encouraged Continue HEP as Tolertated  and heat therapy. Continue Current Medication Regime.03/10/2022 3. Bilateral Greater Trochanteric Bursitis:  Continue current treatment regimen with Heat and Ice Therapy. 03/10/2022 4. Osteoarthritis of Bilateral Knees: R>L Continue current treatment modality with home exercise program and heat therapy. 03/10/2022 5. Migraine Headaches: Continue current medication regimen  Continue with  Migraine  Journal. Aimovig discontinued due to Hives. Continue Lamictal and Relpax. Continue to Monitor.  03/10/2022 6. Smoking Cessation: She has quit smoking since 08/25/2016. Continue to Monitor. 03/10/2022 7.Constipation: Continue  Current medication regime with Linzess. 03/10/2022 8. Muscle Spasm: Continue current medication regime with Tizanidine.03/10/2022 9. DepressionAnxiety/ Panic Attacks: Continue current medication regimen and treatment modality.  Celexa.Lamictal  and  Counseling with [name] and  Dr. De Nurse Psychiatrist. 03/10/2022 10. RSD: Continue  with current medication Regime with Gabapentin. 03/10/2022   F/U in 1 month

## 2022-03-20 ENCOUNTER — Ambulatory Visit (INDEPENDENT_AMBULATORY_CARE_PROVIDER_SITE_OTHER): Payer: Medicare Other | Admitting: Licensed Clinical Social Worker

## 2022-03-20 DIAGNOSIS — F331 Major depressive disorder, recurrent, moderate: Secondary | ICD-10-CM | POA: Diagnosis not present

## 2022-03-20 DIAGNOSIS — F411 Generalized anxiety disorder: Secondary | ICD-10-CM | POA: Diagnosis not present

## 2022-03-20 DIAGNOSIS — G894 Chronic pain syndrome: Secondary | ICD-10-CM

## 2022-03-20 DIAGNOSIS — M5136 Other intervertebral disc degeneration, lumbar region: Secondary | ICD-10-CM | POA: Diagnosis not present

## 2022-03-20 NOTE — Progress Notes (Signed)
Virtual Visit via Video Note  I connected with [name] on 03/20/22 at  8:00 AM EST by a video enabled telemedicine application and verified that I am speaking with the correct person using two identifiers.  Location: Patient: home Provider: office   I discussed the limitations of evaluation and management by telemedicine and the availability of in person appointments. The patient expressed understanding and agreed to proceed.   I discussed the assessment and treatment plan with the patient. The patient was provided an opportunity to ask questions and all were answered. The patient agreed with the plan and demonstrated an understanding of the instructions.   The patient was advised to call back or seek an in-person evaluation if the symptoms worsen or if the condition fails to improve as anticipated.  I provided 16 minutes of non-face-to-face time during this encounter.  THERAPIST PROGRESS NOTE  Session Time: 8:00 AM to 8:16 AM  Participation Level: Active  Behavioral Response: CasualAlertDysphoric  Type of Therapy: Individual Therapy  Treatment Goals addressed:  Patient shares mood improves through interaction and therapy, this happens through positive topics, through working through stressors,   ProgressTowards Goals: Progressing-patient not feeling well today but still continues to come to therapy for support and managing stressors  Interventions: Solution Focused, Strength-based, Supportive, and Other: Coping  Summary: [name] is a 62 y.o. female who presents with hard to sleep and have a headache. Doesn't feel good. Kept the session short today Therapist provided support and space for patient to talk about thoughts and feelings    Suicidal/Homicidal: No  Plan: Return again in 2 weeks.2.Patient process thoughts and feelings to help with coping  Diagnosis: major depressive disorder, recurrent, moderate, degenerative disc disease, chronic pain  syndrome, generalized anxiety disorder  Collaboration of Care: Other none needed  Patient/Guardian was advised Release of Information must be obtained prior to any record release in order to collaborate their care with an outside provider. Patient/Guardian was advised if they have not already done so to contact the registration department to sign all necessary forms in order for [place] to release information regarding their care.   Consent: Patient/Guardian gives verbal consent for treatment and assignment of benefits for services provided during this visit. Patient/Guardian expressed understanding and agreed to proceed.   [name], LCSW 03/20/2022

## 2022-03-29 ENCOUNTER — Other Ambulatory Visit: Payer: Self-pay | Admitting: Registered Nurse

## 2022-03-30 ENCOUNTER — Other Ambulatory Visit: Payer: Self-pay | Admitting: Registered Nurse

## 2022-04-02 ENCOUNTER — Other Ambulatory Visit: Payer: Self-pay | Admitting: Registered Nurse

## 2022-04-03 ENCOUNTER — Ambulatory Visit (INDEPENDENT_AMBULATORY_CARE_PROVIDER_SITE_OTHER): Payer: Medicare Other | Admitting: Licensed Clinical Social Worker

## 2022-04-03 DIAGNOSIS — F331 Major depressive disorder, recurrent, moderate: Secondary | ICD-10-CM | POA: Diagnosis not present

## 2022-04-03 DIAGNOSIS — F411 Generalized anxiety disorder: Secondary | ICD-10-CM

## 2022-04-03 DIAGNOSIS — G894 Chronic pain syndrome: Secondary | ICD-10-CM

## 2022-04-03 DIAGNOSIS — M5136 Other intervertebral disc degeneration, lumbar region: Secondary | ICD-10-CM | POA: Diagnosis not present

## 2022-04-03 NOTE — Progress Notes (Signed)
Virtual Visit via Video Note  I connected with [name] on 04/03/22 at  8:00 AM EST by a video enabled telemedicine application and verified that I am speaking with the correct person using two identifiers.  Location: Patient: home Provider: office   I discussed the limitations of evaluation and management by telemedicine and the availability of in person appointments. The patient expressed understanding and agreed to proceed.   I discussed the assessment and treatment plan with the patient. The patient was provided an opportunity to ask questions and all were answered. The patient agreed with the plan and demonstrated an understanding of the instructions.   The patient was advised to call back or seek an in-person evaluation if the symptoms worsen or if the condition fails to improve as anticipated.  I provided 53 minutes of non-face-to-face time during this encounter.  THERAPIST PROGRESS NOTE  Session Time: 8:00 AM to 8:53 AM  Participation Level: Active  Behavioral Response: CasualAlertDepressed  Type of Therapy: Individual Therapy  Treatment Goals addressed:  Patient shares mood improves through interaction and therapy, this happens through positive topics, through working through stressors,   ProgressTowards Goals: Progressing-therapist assesses therapeutic for patient to talk about loss of her support animal having an outlet to process thoughts and feelings to help with grief  Interventions: Solution Focused, Strength-based, Supportive, and Other: grief  Summary: HIDIE TKACZYK is a 62 y.o. female who presents with [name] is gone had to put him down Wednesday couldn't go after him cleaning after him all day. Was incontinent on top of the peeing. Vet said nothing could do the medication for peeping too expensive. Patient didn't have a choice. It was all the time. Found a company that come to your house and do it. They cremated and brought back after a week  they were so nice "died in mommy's lap singing his songs". Patient was so mad with [name] he can't take it when animals are being put down. Patient stays by the pet doesn't want any of them to be scared with strangers and anaesthetic room not scared by be with mommy and daddy. Talked about memories of other dogs Tinkerbell was the sweetest dog. At one time had five animals. [name] spent the night the day came to put [place] down. Patient wasn't right hadn't slept and thinks took too much medicine [name] was going to call paramedics for patient he didn't wake up and last day to spend with her "boy" took away last few hours could spend with baby. Angry at [place] and don't know if can forgive him. Saw [name] and then he ran out won't let her talk about it need to talk about with somebody. Barbara spent the morning playing with [name] and taking pictures of him. She loved him along with her own pets. [name] like patient crying. Patient makes up nonsense songs for him sang to him at end. [name] with patient and during session petting her. [place] patient. Sweet and can only take her company for awhile. Sure want another dog someday has love for another dog. If got one now replacing [name] irreplaceable weirdest goofiest dog. Loved being near her and always near patient. Keep expecting him to be beside her put hand out not there and then remember. Still miss Redmond Pulling the best cat. Silvester sees his ghost around all the time. Always had psychic connection. [name] was getting older he was 64 years old. Got him when he was 4.  This one is really hard he  was everywhere with her. It has been the worse one ever. If get a dog would be smaller one. Talked about having to clean after [name]. Keeps smacking in her face guilty her fault couldn't help it. If normal could clean after him.  Therapist noted that signs ending of [name] life challenging her control of it. Patient did note with Silvester color changing yellow took to vet liver cancer.  Wish have remains of all of them she and [name] are going to be cremated and put all ashes put together go [place] to [place] where used to climb scatter them.  Made a post on [employer] most knew about him. They have been good to her 3000 looks. Got to talk about to somebody.  Just like kids would have a dozen [name] didn't want any besides herself.  Patient went on to say and she married a man with twins.  Therapist said did not want open up that subject at end of session and patient herself agrees does not want to talk about that now.  Patient shared having to put her dog down and the grief she is having with that.  Therapist noted that her dog was with her all the time so a difficult loss.  Patient has guilt feelings therapist reframe to guide patient that she had said last session when an animal becomes incontinent there toward the end of their life.  Had patient talk about some of the positive memories and with this dog there was a special relationship especially close since he was with her all the time.  Talked about always being important part of her lives and patient writing about him on [employer] commemorates him also a place to talk about him.  Talked about anger to [place] not having the support she needs helpful for patient to express this anger in session and be validated.  Therapist provided support and space for patient to talk about thoughts and feelings in session.  Suicidal/Homicidal: No  Plan: Return again in 2 weeks.2.  Patient utilized session to process thoughts and feelings use that for grief work  Diagnosis: major depressive disorder, recurrent, moderate, degenerative disc disease, chronic pain syndrome, generalized anxiety disorder  Collaboration of Care: Other none needed  Patient/Guardian was advised Release of Information must be obtained prior to any record release in order to collaborate their care with an outside provider. Patient/Guardian was advised if they have not already  done so to contact the registration department to sign all necessary forms in order for [place] to release information regarding their care.   Consent: Patient/Guardian gives verbal consent for treatment and assignment of benefits for services provided during this visit. Patient/Guardian expressed understanding and agreed to proceed.   [name], [place] 04/03/2022

## 2022-04-07 ENCOUNTER — Encounter: Payer: Self-pay | Admitting: Registered Nurse

## 2022-04-07 ENCOUNTER — Encounter: Payer: Medicare Other | Attending: Physical Medicine and Rehabilitation | Admitting: Registered Nurse

## 2022-04-07 VITALS — BP 124/78 | HR 99 | Ht 67.0 in | Wt 233.2 lb

## 2022-04-07 DIAGNOSIS — Z79891 Long term (current) use of opiate analgesic: Secondary | ICD-10-CM | POA: Diagnosis not present

## 2022-04-07 DIAGNOSIS — G894 Chronic pain syndrome: Secondary | ICD-10-CM

## 2022-04-07 DIAGNOSIS — M7062 Trochanteric bursitis, left hip: Secondary | ICD-10-CM | POA: Diagnosis not present

## 2022-04-07 DIAGNOSIS — G8929 Other chronic pain: Secondary | ICD-10-CM

## 2022-04-07 DIAGNOSIS — M546 Pain in thoracic spine: Secondary | ICD-10-CM | POA: Diagnosis not present

## 2022-04-07 DIAGNOSIS — M1712 Unilateral primary osteoarthritis, left knee: Secondary | ICD-10-CM | POA: Insufficient documentation

## 2022-04-07 DIAGNOSIS — G9050 Complex regional pain syndrome I, unspecified: Secondary | ICD-10-CM | POA: Insufficient documentation

## 2022-04-07 DIAGNOSIS — M7061 Trochanteric bursitis, right hip: Secondary | ICD-10-CM | POA: Insufficient documentation

## 2022-04-07 DIAGNOSIS — M47816 Spondylosis without myelopathy or radiculopathy, lumbar region: Secondary | ICD-10-CM | POA: Diagnosis not present

## 2022-04-07 DIAGNOSIS — M1711 Unilateral primary osteoarthritis, right knee: Secondary | ICD-10-CM | POA: Insufficient documentation

## 2022-04-07 DIAGNOSIS — Z5181 Encounter for therapeutic drug level monitoring: Secondary | ICD-10-CM

## 2022-04-07 DIAGNOSIS — M2550 Pain in unspecified joint: Secondary | ICD-10-CM

## 2022-04-07 DIAGNOSIS — G43119 Migraine with aura, intractable, without status migrainosus: Secondary | ICD-10-CM | POA: Diagnosis not present

## 2022-04-07 MED ORDER — MORPHINE SULFATE 15 MG PO TABS: 15.0000 mg | ORAL_TABLET | Freq: Four times a day (QID) | ORAL | 0 refills | Status: DC | PRN

## 2022-04-07 MED ORDER — MORPHINE SULFATE ER 60 MG PO TBCR: 60.0000 mg | EXTENDED_RELEASE_TABLET | Freq: Two times a day (BID) | ORAL | 0 refills | Status: DC

## 2022-04-07 MED ORDER — MORPHINE SULFATE ER 30 MG PO TBCR: 30.0000 mg | EXTENDED_RELEASE_TABLET | Freq: Two times a day (BID) | ORAL | 0 refills | Status: DC

## 2022-04-07 NOTE — Progress Notes (Signed)
Subjective:    Patient ID: [name], female    DOB: 01/24/1961, 62 y.o.   MRN: [id number]  HPI: [name] is a 62 y.o. female who returns for follow up appointment for chronic pain and medication refill. She states her pain is located in her mid- lower back pain, bilateral hip pain and bilateral knee pain. She also reports she has a migraine onset was on 04/06/2022, she states she took her medication, awaiting for the migraine to ease off. She rates her pain 9. Her current exercise regime is walking and performing chair  exercises 6 days a week  Ms. Mezera Morphine equivalent is 242.00 MME.   UDS ordered today.    Pain Inventory Average Pain 8 Pain Right Now 9 My pain is constant, sharp, burning, dull, stabbing, tingling, and aching  In the last 24 hours, has pain interfered with the following? General activity 10 Relation with others 10 Enjoyment of life 10 What TIME of day is your pain at its worst? morning , daytime, evening, and night Sleep (in general) Poor  Pain is worse with: walking, bending, standing, and some activites Pain improves with: rest, heat/ice, therapy/exercise, medication, TENS, and injections Relief from Meds: 5  Family History  Problem Relation Age of Onset   Depression Mother    Hypertension Mother    Anxiety disorder Mother    Obesity Mother    Varicose Veins Mother    Hypertension Father    Heart failure Father    Diabetes Father    Heart attack Father    Arthritis Father    Cancer Father    Obesity Father    Colon cancer Neg Hx    Esophageal cancer Neg Hx    Stomach cancer Neg Hx    Rectal cancer Neg Hx    Social History   Socioeconomic History   Marital status: Married    Spouse name: [name]   Number of children: 2   Years of education: some college   Highest education level: 12th grade  Occupational History   Occupation: On disability    Employer: UNEMPLOYED  Tobacco Use   Smoking status: Former     Packs/day: 0.00    Years: 43.00    Total pack years: 0.00    Types: Cigarettes    Quit date: 02/06/2016    Years since quitting: 6.1   Smokeless tobacco: Never  Vaping Use   Vaping Use: Never used  Substance and Sexual Activity   Alcohol use: No   Drug use: No   Sexual activity: Not Currently    Partners: Male    Birth control/protection: None  Other Topics Concern   Not on file  Social History Narrative   On disability. Lives with her husband. She has one adopted daughter. She likes to read in her free time. She does chair exercises for 30-45 mins everyday.   Social Determinants of Health   Financial Resource Strain: Low Risk  (01/16/2022)   Overall Financial Resource Strain (CARDIA)    Difficulty of Paying Living Expenses: Not very hard  Food Insecurity: No Food Insecurity (01/16/2022)   Hunger Vital Sign    Worried About Running Out of Food in the Last Year: Never true    Ran Out of Food in the Last Year: Never true  Transportation Needs: No Transportation Needs (01/16/2022)   PRAPARE - Hydrologist (Medical): No    Lack of Transportation (Non-Medical): No  Physical Activity:  Inactive (01/16/2022)   Exercise Vital Sign    Days of Exercise per Week: 0 days    Minutes of Exercise per Session: 0 min  Stress: Stress Concern Present (01/16/2022)   [place]    Feeling of Stress : To some extent  Social Connections: Unknown (01/19/2022)   Social Connection and Isolation Panel [NHANES]    Frequency of Communication with Friends and Family: Patient refused    Frequency of Social Gatherings with Friends and Family: Patient refused    Attends Religious Services: Never    Marine scientist or Organizations: No    Attends Music therapist: Never    Marital Status: Married   Past Surgical History:  Procedure Laterality Date   ABDOMINAL HYSTERECTOMY  06/2002    Basel Cell Carcinoma  06/2005, 07/2005   nasal tip and reconstruction   BREAST CYST ASPIRATION     CARPAL TUNNEL RELEASE  07/1993   both hands   COLONOSCOPY     COSMETIC SURGERY  Basal cell carcinoma of nose   HEMORROIDECTOMY  08/2001   JOINT REPLACEMENT  Left knee replacement   KNEE ARTHROPLASTY  09/2001, 05/2003   left knee, chondromalasia   KNEE ARTHROSCOPY  02/2003   Right knee, tisse release, chrondromalacia   REPLACEMENT TOTAL KNEE  12/2003   Left    THYROID LOBECTOMY  01/2005   malignant area removed from right lobe   THYROID SURGERY     thyroid lobe removal   TONSILLECTOMY  09/1972   TUBAL LIGATION  10/1997   Ulnar nerve entrapment  03/1993   left elbow   Past Surgical History:  Procedure Laterality Date   ABDOMINAL HYSTERECTOMY  06/2002   Basel Cell Carcinoma  06/2005, 07/2005   nasal tip and reconstruction   BREAST CYST ASPIRATION     CARPAL TUNNEL RELEASE  07/1993   both hands   COLONOSCOPY     COSMETIC SURGERY  Basal cell carcinoma of nose   HEMORROIDECTOMY  08/2001   JOINT REPLACEMENT  Left knee replacement   KNEE ARTHROPLASTY  09/2001, 05/2003   left knee, chondromalasia   KNEE ARTHROSCOPY  02/2003   Right knee, tisse release, chrondromalacia   REPLACEMENT TOTAL KNEE  12/2003   Left    THYROID LOBECTOMY  01/2005   malignant area removed from right lobe   THYROID SURGERY     thyroid lobe removal   TONSILLECTOMY  09/1972   TUBAL LIGATION  10/1997   Ulnar nerve entrapment  03/1993   left elbow   Past Medical History:  Diagnosis Date   Allergy    Anxiety    Cancer ([place])    cancerous nodules on thyroid   Cancer ([place])    basal cell Cancer-nose   Chronic pain syndrome    COPD (chronic obstructive pulmonary disease) (HCC)    Degeneration of lumbar or lumbosacral intervertebral disc    Depression    Facet syndrome, lumbar    GERD (gastroesophageal reflux disease)    Hyperlipidemia    Intervertebral lumbar disc disorder with myelopathy, lumbar region     Lumbosacral spondylosis without myelopathy    Migraine without aura, with intractable migraine, so stated, without mention of status migrainosus    Primary localized osteoarthrosis, lower leg    Thyroid disease    hypothyroid   Unspecified musculoskeletal disorders and symptoms referable to neck    cervical/trapezius   There were no vitals taken for this visit.  Opioid Risk Score:   Fall Risk Score:  `1  Depression screen [facility] 2/9     03/10/2022    8:13 AM 01/19/2022    2:12 PM 01/10/2022    8:56 AM 12/15/2021    9:01 AM 11/16/2021    9:04 AM 09/21/2021    8:39 AM 08/24/2021    9:06 AM  Depression screen PHQ 2/9  Decreased Interest 1 3 0 '1 1 1 '$ 0  Down, Depressed, Hopeless 1 3 0 '1 1 1 '$ 0  PHQ - 2 Score 2 6 0 '2 2 2 '$ 0  Altered sleeping  3       Tired, decreased energy  3       Change in appetite  1       Feeling bad or failure about yourself   1       Trouble concentrating  1       Moving slowly or fidgety/restless  1       Suicidal thoughts  0       PHQ-9 Score  16       Difficult doing work/chores  Extremely dIfficult           Review of Systems  Musculoskeletal:  Positive for back pain.       Bilateral elbow pain Right knee pain Bilateral ankle pain Bilateral wrist pain  All other systems reviewed and are negative.     Objective:   Physical Exam Vitals and nursing note reviewed.  Constitutional:      Appearance: Normal appearance.  Cardiovascular:     Rate and Rhythm: Normal rate and regular rhythm.     Pulses: Normal pulses.     Heart sounds: Normal heart sounds.  Pulmonary:     Effort: Pulmonary effort is normal.     Breath sounds: Normal breath sounds.  Musculoskeletal:     Cervical back: Normal range of motion and neck supple.     Comments: Normal Muscle Bulk and Muscle Testing Reveals:  Upper Extremities: Full ROM and Muscle Strength 5/5 Thoracic Paraspinal Tenderness: T-7-T-9 Mainly Left Side  Lumbar Paraspinal Tenderness: L-4-L-5 Bilateral Greater  Trochanter Tenderness Lower Extremities: Full ROM and Muscle Strength 5/5 Arises from Table slowly using cane for support Antalgic Gait     Skin:    General: Skin is warm and dry.  Neurological:     Mental Status: She is alert and oriented to person, place, and time.  Psychiatric:        Mood and Affect: Mood normal.        Behavior: Behavior normal.         Assessment & Plan:  1.Chronic Bilateral Thoracic Back Pain/ Low Back Pain/ Lumbar Facet Arthropathy: Refilled MSIR 15 mg one tablet every 6 hours as needed #100 and   Refilled: MS Contin 60 mg and MS Contin  30 mg Q 12 hours to equal   90 mg  #60/60.Marland Kitchen Continue Gabapentin and Pamelor. 04/07/2022 We will continue the opioid monitoring program, this consists of regular clinic visits, examinations, urine drug screen, pill counts as well as use of [place] Controlled Substance Reporting system. A 12 month History has been reviewed on the [place] on 04/07/2022. 2. Degenerative Disk Disease:Encouraged Continue HEP as Tolertated  and heat therapy. Continue Current Medication Regime.04/07/2022 3. Bilateral Greater Trochanteric Bursitis:  Continue current treatment regimen with Heat and Ice Therapy. 04/07/2022 4. Osteoarthritis of Bilateral Knees: R>L Continue current treatment modality with home exercise program  and heat therapy. 04/07/2022 5. Migraine Headaches: Continue current medication regimen  Continue with  Migraine Journal. Aimovig discontinued due to Hives. Continue Lamictal and Relpax. Continue to Monitor.  04/07/2022 6. Smoking Cessation: She has quit smoking since 08/25/2016. Continue to Monitor. 04/07/2022 7.Constipation: Continue  Current medication regime with Linzess. 04/07/2022 8. Muscle Spasm: Continue current medication regime with Tizanidine.04/07/2022 9. DepressionAnxiety/ Panic Attacks: Continue current medication regimen and treatment modality.  Celexa.Lamictal  and  Counseling with [name] and  Dr. De Nurse Psychiatrist. 04/07/2022 10. RSD: Continue with current medication Regime with Gabapentin. 04/07/2022   F/U in 1 month

## 2022-04-12 ENCOUNTER — Telehealth: Payer: Self-pay | Admitting: *Deleted

## 2022-04-12 LAB — TOXASSURE SELECT,+ANTIDEPR,UR

## 2022-04-12 NOTE — Telephone Encounter (Signed)
Urine drug screen for this encounter is consistent for prescribed medication 

## 2022-04-17 ENCOUNTER — Ambulatory Visit (INDEPENDENT_AMBULATORY_CARE_PROVIDER_SITE_OTHER): Payer: Medicare Other | Admitting: Licensed Clinical Social Worker

## 2022-04-17 DIAGNOSIS — G894 Chronic pain syndrome: Secondary | ICD-10-CM

## 2022-04-17 DIAGNOSIS — F411 Generalized anxiety disorder: Secondary | ICD-10-CM

## 2022-04-17 DIAGNOSIS — M5136 Other intervertebral disc degeneration, lumbar region: Secondary | ICD-10-CM

## 2022-04-17 DIAGNOSIS — F331 Major depressive disorder, recurrent, moderate: Secondary | ICD-10-CM | POA: Diagnosis not present

## 2022-04-17 NOTE — Progress Notes (Signed)
Virtual Visit via Video Note  I connected with [name] on 04/17/22 at  8:00 AM EDT by a video enabled telemedicine application and verified that I am speaking with the correct person using two identifiers.  Location: Patient: home Provider: office   I discussed the limitations of evaluation and management by telemedicine and the availability of in person appointments. The patient expressed understanding and agreed to proceed.    I discussed the assessment and treatment plan with the patient. The patient was provided an opportunity to ask questions and all were answered. The patient agreed with the plan and demonstrated an understanding of the instructions.   The patient was advised to call back or seek an in-person evaluation if the symptoms worsen or if the condition fails to improve as anticipated.  I provided 53 minutes of non-face-to-face time during this encounter.  THERAPIST PROGRESS NOTE  Session Time: 8:00 AM to 8:53 AM  Participation Level: Active  Behavioral Response: CasualAlertDepressed addressing grief of loss of her dog but the same time able to have brighter affect in session as well  Type of Therapy: Individual Therapy  Treatment Goals addressed: Patient shares mood improves through interaction and therapy, this happens through positive topics, through working through stressors,   ProgressTowards Goals: Progressing-patient working on grief of loss of her dog using therapy to help her therapeutically with coping  Interventions: Solution Focused, Strength-based, Supportive, and Other: Coping  Summary: [name] is a 62 y.o. female who presents with has been alright. At least not cry as much and talk about it calmed down a lot. Open to a new dog. So lonely in the back by herself. At least [name] comes back but overwhelming. Has to find something specific under 10 pounds. Preferably a young adult can't train a puppy can't run around and train it.  Two rescues looking for her.  Some friends who are on [employer] are connected and looking for a dog for her. Didn't make a general post she doesn't want to bash her they may something like she doesn't respect dog memory enough to wait a month. There are always haters. Therapist agrees. Thought making a general post but have to ready from the negative ones. She needs somebody back with her. [name] with her 98% of time only when went to [place] was not with him. If family doing something he stayed at home. Want a house trained dog that knows when has to go outside. Has the smell of animals need to replace the carpet and probably the wood floors with saturation of urine from lady who owned it before.  [name] has tons of fish finding equipment and doesn't use. He spend money on technology doesn't use or obsolete the next year. Gets her mad.  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 He faced combat. He loved the [employer]. They wanted to step him up but had just bought a house. He has PTSD. And from the animal shelter not from being a cop. He had to put down a lot of dogs. He cam home after 7 years and said not killing another puppy. He couldn't stay with [name] at the end. Understand not seeing it but won't let her talk about it. Patient needed to deal with it except [name] he couldn't be there. She stayed with patient through it all. Both cried. She took him outside all day that morning and she took pictures of him.  She loved him too. They gave her a little glass vial with his ashes. Through next door people are making jewelry, make a ring with ashes, something to wear around neck. Gave those to her she was touched. She is special friend. Hard because understand why he couldn't be there but she needed to talk about it and he couldn't talk about it either.    Talked about made for television that we buy and then don't use. Able to laugh about that.      Patient has been  grieving loss of dog and utilizes session to talk about [place] talk about the loss, the memories special relationship she had with the dog who she explains she spent 98% of time with her.  Therapist pointed out she feels good coping for patient to find another dog way to deal in a constructive way with loss by building another relationship.  Session also spent patient sharing about different subjects aspects of her life which help strengthen relationship and has positive impact on patient as value of relationship with somebody who knows and cares has a significant get therapeutic value.  Therapist provided space and support for patient to talk about thoughts and feelings in session. Suicidal/Homicidal: No  Plan: Return again in 2 weeks.2. Patient utilized session to process thoughts and feelings use that for grief work  Diagnosis: : major depressive disorder, recurrent, moderate, degenerative disc disease, chronic pain syndrome, generalized anxiety disorder   Collaboration of Care: Other none needed  Patient/Guardian was advised Release of Information must be obtained prior to any record release in order to collaborate their care with an outside provider. Patient/Guardian was advised if they have not already done so to contact the registration department to sign all necessary forms in order for [place] to release information regarding their care.   Consent: Patient/Guardian gives verbal consent for treatment and assignment of benefits for services provided during this visit. Patient/Guardian expressed understanding and agreed to proceed.   [name], LCSW 04/17/2022

## 2022-04-28 ENCOUNTER — Other Ambulatory Visit: Payer: Self-pay | Admitting: Registered Nurse

## 2022-05-01 ENCOUNTER — Ambulatory Visit (INDEPENDENT_AMBULATORY_CARE_PROVIDER_SITE_OTHER): Payer: Medicare Other | Admitting: Licensed Clinical Social Worker

## 2022-05-01 DIAGNOSIS — M51369 Other intervertebral disc degeneration, lumbar region without mention of lumbar back pain or lower extremity pain: Secondary | ICD-10-CM

## 2022-05-01 DIAGNOSIS — F331 Major depressive disorder, recurrent, moderate: Secondary | ICD-10-CM | POA: Diagnosis not present

## 2022-05-01 DIAGNOSIS — M5136 Other intervertebral disc degeneration, lumbar region: Secondary | ICD-10-CM

## 2022-05-01 DIAGNOSIS — F411 Generalized anxiety disorder: Secondary | ICD-10-CM | POA: Diagnosis not present

## 2022-05-01 DIAGNOSIS — G894 Chronic pain syndrome: Secondary | ICD-10-CM

## 2022-05-01 NOTE — Progress Notes (Signed)
Virtual Visit via Video Note  I connected with [name] on 05/01/22 at  8:00 AM EDT by a video enabled telemedicine application and verified that I am speaking with the correct person using two identifiers.  Location: Patient: home Provider: office   I discussed the limitations of evaluation and management by telemedicine and the availability of in person appointments. The patient expressed understanding and agreed to proceed.   I discussed the assessment and treatment plan with the patient. The patient was provided an opportunity to ask questions and all were answered. The patient agreed with the plan and demonstrated an understanding of the instructions.   The patient was advised to call back or seek an in-person evaluation if the symptoms worsen or if the condition fails to improve as anticipated.  I provided 52 minutes of non-face-to-face time during this encounter.  THERAPIST PROGRESS NOTE  Session Time: 8:00 AM to 8:52 AM  Participation Level: Active  Behavioral Response: CasualAlertDysphoric assess also positive as patient takes positive steps assess helpful for mood to have therapy session  Type of Therapy: Individual Therapy  Treatment Goals addressed:  Patient shares mood improves through interaction and therapy, this happens through positive topics, through working through stressors,   ProgressTowards Goals: Progressing-helpful for patient to process thoughts and feelings in session helpful as well that therapist encourages her with positive steps  Interventions: Solution Focused, Strength-based, Supportive, and Other: Coping  Summary: [name] is a 62 y.o. female who presents with up at night took naps don't get rested when sleep in little bits. Turned around with night and day. Had bronchitis last week chronic have every spring and fall. Problems with sleep always feel like crap and don't get a break. Last Wednesday birthday decided to make a post  on [employer] looking for another dog. There are so many nice people offering to help out. Put a picture of when [name] passed also her profile picture. Been looking 5 days straight so many dogs put application in different places. Happened three times found perfect dog and then got adopted before she could get it. Looking for a particular one doesn't care about the breed or sex but a small dog. Senior dog would be ok a lot of love for animals. [name] cuddling next to her.  [name] gave her a birthday presents and blew her mind hadn't given her anything in 12/13 years. Hair thinning looking for something that will help grow always been one of her best features. See regular doctor and see if she can do anything about it.  Plans on going to pool three days a week to lose some weight. Always been a "mermaid". She will go with friend [name]. When younger hard to get out of the water. Thinks the pool will help if gets going. They never met talked on [employer] for a long time. Therapist noted for pain issues the water would be good therapy. Hurts when move swimming pool takes that away. Has done dumbbell exercises and chair exercises. Not really burning a lot of calories they do help keep you limber.  Always feel better talking to therapist brings her up. Self-conscious of how look as older look different looks like Mom and doesn't like it. Looked the best and felt the best at 63 the year met [name]. She got her divorce kids grown and settled. Didn't leave [name] until girls settled last three years was just for that girls out of high school and working.  As patient notes feels better after session so has significant therapeutic value for her.  Assessed mood getting it down as she has limited mobility isolated at her house.  Note she is taking some positive steps that are significant to therapist she is going to start going to the pool and this will help with therapy for pain issues as well as helping her lose weight,  will have positive impact on mood.  Assess this helping her make good progress in treatment.  Also positive started looking for a small dog for companionship.  Assess healthy that patient is moving on and dealing with her grief.  Therapist noted comfort of dog they do have who is sitting right next to patient.  Therapist provided space and support for patient to talk about thoughts and feelings reviewed history also think this helps patient with mood we talked about things like how much she loves the water best time she felt in her life. Suicidal/Homicidal: No  Plan: Return again in 2 weeks.2.Patient utilized session to process thoughts and feelings use that for grief work  Diagnosis: major depressive disorder, recurrent, moderate, degenerative disc disease, chronic pain syndrome, generalized anxiety disorder  Collaboration of Care: Other none needed  Patient/Guardian was advised Release of Information must be obtained prior to any record release in order to collaborate their care with an outside provider. Patient/Guardian was advised if they have not already done so to contact the registration department to sign all necessary forms in order for [place] to release information regarding their care.   Consent: Patient/Guardian gives verbal consent for treatment and assignment of benefits for services provided during this visit. Patient/Guardian expressed understanding and agreed to proceed.   Frances Register, LCSW 05/01/2022

## 2022-05-09 ENCOUNTER — Encounter: Payer: Medicare Other | Attending: Physical Medicine and Rehabilitation | Admitting: Registered Nurse

## 2022-05-09 ENCOUNTER — Encounter: Payer: Self-pay | Admitting: Registered Nurse

## 2022-05-09 VITALS — BP 107/76 | HR 97 | Ht 67.0 in | Wt 237.0 lb

## 2022-05-09 DIAGNOSIS — M7062 Trochanteric bursitis, left hip: Secondary | ICD-10-CM | POA: Insufficient documentation

## 2022-05-09 DIAGNOSIS — G8929 Other chronic pain: Secondary | ICD-10-CM

## 2022-05-09 DIAGNOSIS — G9050 Complex regional pain syndrome I, unspecified: Secondary | ICD-10-CM

## 2022-05-09 DIAGNOSIS — G43119 Migraine with aura, intractable, without status migrainosus: Secondary | ICD-10-CM | POA: Diagnosis not present

## 2022-05-09 DIAGNOSIS — Z79891 Long term (current) use of opiate analgesic: Secondary | ICD-10-CM | POA: Diagnosis not present

## 2022-05-09 DIAGNOSIS — M2550 Pain in unspecified joint: Secondary | ICD-10-CM | POA: Diagnosis not present

## 2022-05-09 DIAGNOSIS — M7061 Trochanteric bursitis, right hip: Secondary | ICD-10-CM

## 2022-05-09 DIAGNOSIS — M546 Pain in thoracic spine: Secondary | ICD-10-CM | POA: Diagnosis not present

## 2022-05-09 DIAGNOSIS — G894 Chronic pain syndrome: Secondary | ICD-10-CM | POA: Diagnosis not present

## 2022-05-09 DIAGNOSIS — M1711 Unilateral primary osteoarthritis, right knee: Secondary | ICD-10-CM | POA: Diagnosis not present

## 2022-05-09 DIAGNOSIS — M1712 Unilateral primary osteoarthritis, left knee: Secondary | ICD-10-CM

## 2022-05-09 DIAGNOSIS — Z5181 Encounter for therapeutic drug level monitoring: Secondary | ICD-10-CM | POA: Diagnosis not present

## 2022-05-09 DIAGNOSIS — M47816 Spondylosis without myelopathy or radiculopathy, lumbar region: Secondary | ICD-10-CM

## 2022-05-09 NOTE — Progress Notes (Signed)
Subjective:    Patient ID: [name], female    DOB: 13-Mar-1960, 62 y.o.   MRN: [id number]  HPI: [name] is a 62 y.o. female who returns for follow up appointment for chronic pain and medication refill. She states her pain is located in her mid- back mainly left side, lower back pain and bilateral hip pain. She also reports bilateral knee pain R>L. She also asked about trigger point injection with Dr Naaman Plummer, she will be scheduled with Dr Naaman Plummer, she verbalizes understanding. She rates her pain 8. Her current exercise regime is walking and performing stretching exercises.  [name] Morphine equivalent is 212.00 MME.   Last UDS was Performed on 04/07/2022, it was consistent.    Pain Inventory Average Pain 8 Pain Right Now 8 My pain is constant, sharp, burning, dull, stabbing, tingling, and aching  In the last 24 hours, has pain interfered with the following? General activity 10 Relation with others 10 Enjoyment of life 10 What TIME of day is your pain at its worst? morning , daytime, evening, and night Sleep (in general) Poor  Pain is worse with: walking, bending, standing, and some activites Pain improves with: rest, heat/ice, therapy/exercise, medication, TENS, and injections Relief from Meds: 6  Family History  Problem Relation Age of Onset   Depression Mother    Hypertension Mother    Anxiety disorder Mother    Obesity Mother    Varicose Veins Mother    Hypertension Father    Heart failure Father    Diabetes Father    Heart attack Father    Arthritis Father    Cancer Father    Obesity Father    Colon cancer Neg Hx    Esophageal cancer Neg Hx    Stomach cancer Neg Hx    Rectal cancer Neg Hx    Social History   Socioeconomic History   Marital status: Married    Spouse name: [name]   Number of children: 2   Years of education: some college   Highest education level: 12th grade  Occupational History   Occupation: On disability     Employer: UNEMPLOYED  Tobacco Use   Smoking status: Former    Packs/day: 0.00    Years: 43.00    Additional pack years: 0.00    Total pack years: 0.00    Types: Cigarettes    Quit date: 02/06/2016    Years since quitting: 6.2   Smokeless tobacco: Never  Vaping Use   Vaping Use: Never used  Substance and Sexual Activity   Alcohol use: No   Drug use: No   Sexual activity: Not Currently    Partners: Male    Birth control/protection: None  Other Topics Concern   Not on file  Social History Narrative   On disability. Lives with her husband. She has one adopted daughter. She likes to read in her free time. She does chair exercises for 30-45 mins everyday.   Social Determinants of Health   Financial Resource Strain: Low Risk  (01/16/2022)   Overall Financial Resource Strain (CARDIA)    Difficulty of Paying Living Expenses: Not very hard  Food Insecurity: No Food Insecurity (01/16/2022)   Hunger Vital Sign    Worried About Running Out of Food in the Last Year: Never true    Ran Out of Food in the Last Year: Never true  Transportation Needs: No Transportation Needs (01/16/2022)   PRAPARE - Hydrologist (Medical):  No    Lack of Transportation (Non-Medical): No  Physical Activity: Inactive (01/16/2022)   Exercise Vital Sign    Days of Exercise per Week: 0 days    Minutes of Exercise per Session: 0 min  Stress: Stress Concern Present (01/16/2022)   [place]    Feeling of Stress : To some extent  Social Connections: Unknown (01/19/2022)   Social Connection and Isolation Panel [NHANES]    Frequency of Communication with Friends and Family: Patient declined    Frequency of Social Gatherings with Friends and Family: Patient declined    Attends Religious Services: Never    Marine scientist or Organizations: No    Attends Music therapist: Never    Marital Status:  Married   Past Surgical History:  Procedure Laterality Date   ABDOMINAL HYSTERECTOMY  06/2002   Basel Cell Carcinoma  06/2005, 07/2005   nasal tip and reconstruction   BREAST CYST ASPIRATION     CARPAL TUNNEL RELEASE  07/1993   both hands   COLONOSCOPY     COSMETIC SURGERY  Basal cell carcinoma of nose   HEMORROIDECTOMY  08/2001   JOINT REPLACEMENT  Left knee replacement   KNEE ARTHROPLASTY  09/2001, 05/2003   left knee, chondromalasia   KNEE ARTHROSCOPY  02/2003   Right knee, tisse release, chrondromalacia   REPLACEMENT TOTAL KNEE  12/2003   Left    THYROID LOBECTOMY  01/2005   malignant area removed from right lobe   THYROID SURGERY     thyroid lobe removal   TONSILLECTOMY  09/1972   TUBAL LIGATION  10/1997   Ulnar nerve entrapment  03/1993   left elbow   Past Surgical History:  Procedure Laterality Date   ABDOMINAL HYSTERECTOMY  06/2002   Basel Cell Carcinoma  06/2005, 07/2005   nasal tip and reconstruction   BREAST CYST ASPIRATION     CARPAL TUNNEL RELEASE  07/1993   both hands   COLONOSCOPY     COSMETIC SURGERY  Basal cell carcinoma of nose   HEMORROIDECTOMY  08/2001   JOINT REPLACEMENT  Left knee replacement   KNEE ARTHROPLASTY  09/2001, 05/2003   left knee, chondromalasia   KNEE ARTHROSCOPY  02/2003   Right knee, tisse release, chrondromalacia   REPLACEMENT TOTAL KNEE  12/2003   Left    THYROID LOBECTOMY  01/2005   malignant area removed from right lobe   THYROID SURGERY     thyroid lobe removal   TONSILLECTOMY  09/1972   TUBAL LIGATION  10/1997   Ulnar nerve entrapment  03/1993   left elbow   Past Medical History:  Diagnosis Date   Allergy    Anxiety    Cancer    cancerous nodules on thyroid   Cancer    basal cell Cancer-nose   Chronic pain syndrome    COPD (chronic obstructive pulmonary disease)    Degeneration of lumbar or lumbosacral intervertebral disc    Depression    Facet syndrome, lumbar    GERD (gastroesophageal reflux disease)     Hyperlipidemia    Intervertebral lumbar disc disorder with myelopathy, lumbar region    Lumbosacral spondylosis without myelopathy    Migraine without aura, with intractable migraine, so stated, without mention of status migrainosus    Primary localized osteoarthrosis, lower leg    Thyroid disease    hypothyroid   Unspecified musculoskeletal disorders and symptoms referable to neck    cervical/trapezius  BP 107/76   Pulse 97   Ht 5\' 7"  (1.702 m)   Wt 237 lb (107.5 kg)   SpO2 95%   BMI 37.12 kg/m   Opioid Risk Score:   Fall Risk Score:  `1  Depression screen [facility] 2/9     05/09/2022    8:21 AM 04/07/2022    8:52 AM 03/10/2022    8:13 AM 01/19/2022    2:12 PM 01/10/2022    8:56 AM 12/15/2021    9:01 AM 11/16/2021    9:04 AM  Depression screen PHQ 2/9  Decreased Interest 1 1 1 3  0 1 1  Down, Depressed, Hopeless 1 1 1 3  0 1 1  PHQ - 2 Score 2 2 2 6  0 2 2  Altered sleeping    3     Tired, decreased energy    3     Change in appetite    1     Feeling bad or failure about yourself     1     Trouble concentrating    1     Moving slowly or fidgety/restless    1     Suicidal thoughts    0     PHQ-9 Score    16     Difficult doing work/chores    Extremely dIfficult       Review of Systems  Musculoskeletal:  Positive for arthralgias, back pain and gait problem.       Right knee pain  All other systems reviewed and are negative.      Objective:   Physical Exam Vitals and nursing note reviewed.  Constitutional:      Appearance: Normal appearance.  Cardiovascular:     Rate and Rhythm: Normal rate and regular rhythm.     Pulses: Normal pulses.     Heart sounds: Normal heart sounds.  Pulmonary:     Effort: Pulmonary effort is normal.     Breath sounds: Normal breath sounds.  Musculoskeletal:     Cervical back: Normal range of motion and neck supple.     Comments: Normal Muscle Bulk and Muscle Testing Reveals:  Upper Extremities:Full  ROM and Muscle Strength 5/5  Thoracic  Paraspinal Tenderness: T-7-T-9 Mainly Left Side   Lumbar Paraspinal Tenderness: L-3-L-5 Bilateral Greater trochanter Tenderness Lower Extremities: Right: Decreased ROM and Muscle Strength 5/5 Right Lower Extremity Flexion Produces Pain into her Right Patella Left Lower Extremity: Full ROM and Muscle Strength 5/5 Arises from Table Slowly using cane for support Antalgic  Gait     Skin:    General: Skin is warm and dry.  Neurological:     Mental Status: She is alert and oriented to person, place, and time.  Psychiatric:        Mood and Affect: Mood normal.        Behavior: Behavior normal.         Assessment & Plan:  1.Chronic Bilateral Thoracic Back Pain: Schedule Trigger Point Injection with Dr Naaman Plummer.  Low Back Pain/ Lumbar Facet Arthropathy: Continue MSIR 15 mg one tablet every 6 hours as needed #100 and   Refilled: MS Contin 60 mg and MS Contin  30 mg Q 12 hours to equal   90 mg  #60/60.Marland Kitchen Continue Gabapentin and Pamelor. 05/09/2022 We will continue the opioid monitoring program, this consists of regular clinic visits, examinations, urine drug screen, pill counts as well as use of [place] Controlled Substance Reporting system. A 12 month History has been reviewed  on the [place] on 05/09/2022. 2. Degenerative Disk Disease:Encouraged Continue HEP as Tolertated  and heat therapy. Continue Current Medication Regime.05/09/2022 3. Bilateral Greater Trochanteric Bursitis:  Continue current treatment regimen with Heat and Ice Therapy. 05/09/2022 4. Osteoarthritis of Bilateral Knees: R>L Continue current treatment modality with home exercise program and heat therapy. 05/09/2022 5. Migraine Headaches: Continue current medication regimen  Continue with  Migraine Journal. Aimovig discontinued due to Hives. Continue Lamictal and Relpax. Continue to Monitor.  05/09/2022 6. Smoking Cessation: She has quit smoking since 08/25/2016. Continue to  Monitor. 05/09/2022 7.Constipation: Continue  Current medication regime with Linzess. 05/09/2022 8. Muscle Spasm: Continue current medication regime with Tizanidine.05/09/2022 9. DepressionAnxiety/ Panic Attacks: Continue current medication regimen and treatment modality.  Celexa.Lamictal  and Counseling with [name] and  Dr. De Nurse Psychiatrist. 05/09/2022 10. RSD: Continue with current medication Regime with Gabapentin. 05/09/2022   F/U in 1 month

## 2022-05-10 ENCOUNTER — Other Ambulatory Visit: Payer: Self-pay | Admitting: Family Medicine

## 2022-05-10 DIAGNOSIS — Z1231 Encounter for screening mammogram for malignant neoplasm of breast: Secondary | ICD-10-CM

## 2022-05-11 ENCOUNTER — Other Ambulatory Visit: Payer: Self-pay | Admitting: Registered Nurse

## 2022-05-15 ENCOUNTER — Ambulatory Visit (INDEPENDENT_AMBULATORY_CARE_PROVIDER_SITE_OTHER): Payer: Medicare Other | Admitting: Licensed Clinical Social Worker

## 2022-05-15 ENCOUNTER — Encounter (HOSPITAL_COMMUNITY): Payer: Self-pay

## 2022-05-15 DIAGNOSIS — F331 Major depressive disorder, recurrent, moderate: Secondary | ICD-10-CM

## 2022-05-15 DIAGNOSIS — F411 Generalized anxiety disorder: Secondary | ICD-10-CM

## 2022-05-15 NOTE — Progress Notes (Signed)
Therapist contacted patient through My Chart and she did not respond 

## 2022-05-26 ENCOUNTER — Other Ambulatory Visit: Payer: Self-pay | Admitting: Registered Nurse

## 2022-05-29 ENCOUNTER — Ambulatory Visit (INDEPENDENT_AMBULATORY_CARE_PROVIDER_SITE_OTHER): Payer: Medicare Other | Admitting: Licensed Clinical Social Worker

## 2022-05-29 DIAGNOSIS — G894 Chronic pain syndrome: Secondary | ICD-10-CM

## 2022-05-29 DIAGNOSIS — F331 Major depressive disorder, recurrent, moderate: Secondary | ICD-10-CM | POA: Diagnosis not present

## 2022-05-29 DIAGNOSIS — F411 Generalized anxiety disorder: Secondary | ICD-10-CM

## 2022-05-29 DIAGNOSIS — M5136 Other intervertebral disc degeneration, lumbar region: Secondary | ICD-10-CM

## 2022-05-29 NOTE — Progress Notes (Signed)
Virtual Visit via Video Note  I connected with [name] on 05/29/22 at  8:00 AM EDT by a video enabled telemedicine application and verified that I am speaking with the correct person using two identifiers.  Location: Patient: home Provider: office   I discussed the limitations of evaluation and management by telemedicine and the availability of in person appointments. The patient expressed understanding and agreed to proceed.   I discussed the assessment and treatment plan with the patient. The patient was provided an opportunity to ask questions and all were answered. The patient agreed with the plan and demonstrated an understanding of the instructions.   The patient was advised to call back or seek an in-person evaluation if the symptoms worsen or if the condition fails to improve as anticipated.  I provided 52 minutes of non-face-to-face time during this encounter.  THERAPIST PROGRESS NOTE  Session Time: 8:00 AM to 8:52 AM  Participation Level: Active  Behavioral Response: CasualAlertEuthymic  Type of Therapy: Individual Therapy  Treatment Goals addressed: Patient shares mood improves through interaction and therapy, this happens through positive topics, through working through stressors,   ProgressTowards Goals: Progressing-assess therapy helpful for mood enhancement coping noted as well patient having another support animal helps significantly with mood  Interventions: Solution Focused, Strength-based, Supportive, and Other: Coping  Summary: [name] is a 62 y.o. female who presents with got her dog 2 weeks ago with friend Britta Mccreedy who cleans for them. Up for adoption for months found in church by minister he was skinny and sick took to vet and miracle that survived. Had him at rescue for months and nobody wanted him. Pure bred toy poodle, has long legs for toy poodle. Call him [name] perfect for patient who lays around. He is black except lightening  bolt on chest, chin looks like put in a bowl of milk. White streaks on feet. Can't see his eyes they are as dark as he is. Learning to use pee pee pads. Bonded strong to patient doesn't want to know Bronson Curb or [employer]. Not sure if intimidated or just be with anyone but her. Settled in attuned with patient follows her around. She feels better. Not taking the place of [name] but helping her a lot. Give her something to focus on and gives her chores taking care of him.  Talked about different snacks like, likes honey wheat bread. If hungry and grab a candy, Smarties, tootsie roll pops, helps with appetite. Talked about adding flavors to water.  [place] Mom [place] their house. That was her dream house and [name] had it built. It was wonderful. In summer place to go they [place]. [place] if tired of [place] go to beach it was a great place to swim hit by [employer].  Learned something about each other with [name]. Patient doesn't sleep, he chews on her fingers light bites to get her attention. Finally realize what telling her put her down needs to poop. Both are on weird time schedule.        Patient introduced therapist to her new dog therapist noted already patient looks better and she says she feels better.  Noted that the dog is has benefited as well rescuing the dog and finding a home and seem a good fit for patient as the dog 2 does not need to be moving around but stays next to patient and lounge is.  Utilized session to  talk about positive memories from the past again helpful for enhancing mood when patient remembers some of the great experience that she had as a kid.  Talked more about dog some of his habits getting to know each other.  Noted gives her purpose which is significant in managing her mood.  Other topics came up that were fun talking about favorite snacks talking about patient strategy of losing weight that is helpful  by eating sweets.  Therapist provided space and support for patient to talk about thoughts and feelings in session. Suicidal/Homicidal: No  Plan: Return again in 2 weeks.2.Patient utilize session to process thoughts and feelings use that for grief work  Diagnosis: major depressive disorder, recurrent, moderate, degenerative disc disease, chronic pain syndrome, generalized anxiety disorder  Collaboration of Care: Other none needed  Patient/Guardian was advised Release of Information must be obtained prior to any record release in order to collaborate their care with an outside provider. Patient/Guardian was advised if they have not already done so to contact the registration department to sign all necessary forms in order for [place] to release information regarding their care.   Consent: Patient/Guardian gives verbal consent for treatment and assignment of benefits for services provided during this visit. Patient/Guardian expressed understanding and agreed to proceed.   Coolidge Breeze, [place] 05/29/2022

## 2022-06-02 ENCOUNTER — Telehealth: Payer: Self-pay | Admitting: Registered Nurse

## 2022-06-02 DIAGNOSIS — G894 Chronic pain syndrome: Secondary | ICD-10-CM

## 2022-06-02 DIAGNOSIS — M1711 Unilateral primary osteoarthritis, right knee: Secondary | ICD-10-CM

## 2022-06-02 MED ORDER — MORPHINE SULFATE 15 MG PO TABS: 15.0000 mg | ORAL_TABLET | Freq: Four times a day (QID) | ORAL | 0 refills | Status: DC | PRN

## 2022-06-02 MED ORDER — MORPHINE SULFATE ER 60 MG PO TBCR: 60.0000 mg | EXTENDED_RELEASE_TABLET | Freq: Two times a day (BID) | ORAL | 0 refills | Status: DC

## 2022-06-02 MED ORDER — MORPHINE SULFATE ER 30 MG PO TBCR: 30.0000 mg | EXTENDED_RELEASE_TABLET | Freq: Two times a day (BID) | ORAL | 0 refills | Status: DC

## 2022-06-02 NOTE — Telephone Encounter (Signed)
PMP was Reviewed.  Morphine prescriptions e-scribed today.  She has a scheduled appointment with Dr Riley Kill on 06/14/2022

## 2022-06-05 ENCOUNTER — Ambulatory Visit (INDEPENDENT_AMBULATORY_CARE_PROVIDER_SITE_OTHER): Payer: Self-pay | Admitting: Family Medicine

## 2022-06-05 DIAGNOSIS — L659 Nonscarring hair loss, unspecified: Secondary | ICD-10-CM

## 2022-06-05 DIAGNOSIS — E1142 Type 2 diabetes mellitus with diabetic polyneuropathy: Secondary | ICD-10-CM

## 2022-06-05 DIAGNOSIS — N3281 Overactive bladder: Secondary | ICD-10-CM

## 2022-06-05 DIAGNOSIS — E039 Hypothyroidism, unspecified: Secondary | ICD-10-CM

## 2022-06-05 NOTE — Progress Notes (Unsigned)
No show

## 2022-06-07 ENCOUNTER — Encounter: Payer: Medicare Other | Admitting: Registered Nurse

## 2022-06-12 ENCOUNTER — Ambulatory Visit (INDEPENDENT_AMBULATORY_CARE_PROVIDER_SITE_OTHER): Payer: Medicare Other | Admitting: Licensed Clinical Social Worker

## 2022-06-12 DIAGNOSIS — F411 Generalized anxiety disorder: Secondary | ICD-10-CM | POA: Diagnosis not present

## 2022-06-12 DIAGNOSIS — F331 Major depressive disorder, recurrent, moderate: Secondary | ICD-10-CM | POA: Diagnosis not present

## 2022-06-12 DIAGNOSIS — M5136 Other intervertebral disc degeneration, lumbar region: Secondary | ICD-10-CM

## 2022-06-12 DIAGNOSIS — G894 Chronic pain syndrome: Secondary | ICD-10-CM
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
## 2022-06-14 ENCOUNTER — Encounter: Payer: Medicare Other | Attending: Physical Medicine and Rehabilitation | Admitting: Physical Medicine & Rehabilitation

## 2022-06-14 ENCOUNTER — Encounter: Payer: Self-pay | Admitting: Physical Medicine & Rehabilitation

## 2022-06-14 VITALS — BP 132/71 | HR 103 | Ht 67.0 in | Wt 239.0 lb

## 2022-06-14 DIAGNOSIS — M7918 Myalgia, other site: Secondary | ICD-10-CM | POA: Insufficient documentation

## 2022-06-14 MED ORDER — LIDOCAINE HCL 1 % IJ SOLN
8.0000 mL | Freq: Once | INTRAMUSCULAR | Status: AC
Start: 2022-06-14 — End: 2022-06-14
  Administered 2022-06-14: 8 mL

## 2022-06-14 NOTE — Progress Notes (Signed)
Subjective:    Patient ID: [name], female    DOB: 03-Nov-1960, 62 y.o.   MRN: [id number]  HPI  [name] is here in follow up of her chronic pain. She has had increased spasms in her back from her lower neck to her lumbar spine.   She remains on MSIR 15mg  3-4 per day and MS CR 90mg  q12  She is using ice packs on her knees and a heating pad on her back.   [name] would like trigger point injections today.   She denies any new health issues. She jut acquired a new rescue dog.     Pain Inventory Average Pain 8 Pain Right Now 8 My pain is constant, sharp, burning, dull, stabbing, tingling, and aching  In the last 24 hours, has pain interfered with the following? General activity 10 Relation with others 10 Enjoyment of life 10 What TIME of day is your pain at its worst? morning , daytime, evening, night, and varies Sleep (in general) Poor  Pain is worse with: walking, bending, standing, and some activites Pain improves with: rest, heat/ice, therapy/exercise, TENS, and injections Relief from Meds: 4  Family History  Problem Relation Age of Onset   Depression Mother    Hypertension Mother    Anxiety disorder Mother    Obesity Mother    Varicose Veins Mother    Hypertension Father    Heart failure Father    Diabetes Father    Heart attack Father    Arthritis Father    Cancer Father    Obesity Father    Colon cancer Neg Hx    Esophageal cancer Neg Hx    Stomach cancer Neg Hx    Rectal cancer Neg Hx    Social History   Socioeconomic History   Marital status: Married    Spouse name: Sullivan Lone   Number of children: 2   Years of education: some college   Highest education level: 12th grade  Occupational History   Occupation: On disability    Employer: UNEMPLOYED  Tobacco Use   Smoking status: Former    Packs/day: 0.00    Years: 43.00    Additional pack years: 0.00    Total pack years: 0.00    Types: Cigarettes    Quit date: 02/06/2016    Years since  quitting: 6.3   Smokeless tobacco: Never  Vaping Use   Vaping Use: Never used  Substance and Sexual Activity   Alcohol use: No   Drug use: No   Sexual activity: Not Currently    Partners: Male    Birth control/protection: None  Other Topics Concern   Not on file  Social History Narrative   On disability. Lives with her husband. She has one adopted daughter. She likes to read in her free time. She does chair exercises for 30-45 mins everyday.   Social Determinants of Health   Financial Resource Strain: Low Risk  (01/16/2022)   Overall Financial Resource Strain (CARDIA)    Difficulty of Paying Living Expenses: Not very hard  Food Insecurity: No Food Insecurity (01/16/2022)   Hunger Vital Sign    Worried About Running Out of Food in the Last Year: Never true    Ran Out of Food in the Last Year: Never true  Transportation Needs: No Transportation Needs (01/16/2022)   PRAPARE - Administrator, Civil Service (Medical): No    Lack of Transportation (Non-Medical): No  Physical Activity: Inactive (01/16/2022)   Exercise Vital  Sign    Days of Exercise per Week: 0 days    Minutes of Exercise per Session: 0 min  Stress: Stress Concern Present (01/16/2022)   [employer] of Occupational Health - Occupational Stress Questionnaire    Feeling of Stress : To some extent  Social Connections: Unknown (01/19/2022)   Social Connection and Isolation Panel [NHANES]    Frequency of Communication with Friends and Family: Patient declined    Frequency of Social Gatherings with Friends and Family: Patient declined    Attends Religious Services: Never    Database administrator or Organizations: No    Attends Engineer, structural: Never    Marital Status: Married   Past Surgical History:  Procedure Laterality Date   ABDOMINAL HYSTERECTOMY  06/2002   Basel Cell Carcinoma  06/2005, 07/2005   nasal tip and reconstruction   BREAST CYST ASPIRATION     CARPAL TUNNEL RELEASE   07/1993   both hands   COLONOSCOPY     COSMETIC SURGERY  Basal cell carcinoma of nose   HEMORROIDECTOMY  08/2001   JOINT REPLACEMENT  Left knee replacement   KNEE ARTHROPLASTY  09/2001, 05/2003   left knee, chondromalasia   KNEE ARTHROSCOPY  02/2003   Right knee, tisse release, chrondromalacia   REPLACEMENT TOTAL KNEE  12/2003   Left    THYROID LOBECTOMY  01/2005   malignant area removed from right lobe   THYROID SURGERY     thyroid lobe removal   TONSILLECTOMY  09/1972   TUBAL LIGATION  10/1997   Ulnar nerve entrapment  03/1993   left elbow   Past Surgical History:  Procedure Laterality Date   ABDOMINAL HYSTERECTOMY  06/2002   Basel Cell Carcinoma  06/2005, 07/2005   nasal tip and reconstruction   BREAST CYST ASPIRATION     CARPAL TUNNEL RELEASE  07/1993   both hands   COLONOSCOPY     COSMETIC SURGERY  Basal cell carcinoma of nose   HEMORROIDECTOMY  08/2001   JOINT REPLACEMENT  Left knee replacement   KNEE ARTHROPLASTY  09/2001, 05/2003   left knee, chondromalasia   KNEE ARTHROSCOPY  02/2003   Right knee, tisse release, chrondromalacia   REPLACEMENT TOTAL KNEE  12/2003   Left    THYROID LOBECTOMY  01/2005   malignant area removed from right lobe   THYROID SURGERY     thyroid lobe removal   TONSILLECTOMY  09/1972   TUBAL LIGATION  10/1997   Ulnar nerve entrapment  03/1993   left elbow   Past Medical History:  Diagnosis Date   Allergy    Anxiety    Cancer (HCC)    cancerous nodules on thyroid   Cancer (HCC)    basal cell Cancer-nose   Chronic pain syndrome    COPD (chronic obstructive pulmonary disease) (HCC)    Degeneration of lumbar or lumbosacral intervertebral disc    Depression    Facet syndrome, lumbar    GERD (gastroesophageal reflux disease)    Hyperlipidemia    Intervertebral lumbar disc disorder with myelopathy, lumbar region    Lumbosacral spondylosis without myelopathy    Migraine without aura, with intractable migraine, so stated, without mention  of status migrainosus    Primary localized osteoarthrosis, lower leg    Thyroid disease    hypothyroid   Unspecified musculoskeletal disorders and symptoms referable to neck    cervical/trapezius   Ht 5\' 7"  (1.702 m)   Wt 239 lb (108.4 kg)   BMI  37.43 kg/m   Opioid Risk Score:   Fall Risk Score:  `1  Depression screen [facility] 2/9     05/09/2022    8:21 AM 04/07/2022    8:52 AM 03/10/2022    8:13 AM 01/19/2022    2:12 PM 01/10/2022    8:56 AM 12/15/2021    9:01 AM 11/16/2021    9:04 AM  Depression screen PHQ 2/9  Decreased Interest 1 1 1 3  0 1 1  Down, Depressed, Hopeless 1 1 1 3  0 1 1  PHQ - 2 Score 2 2 2 6  0 2 2  Altered sleeping    3     Tired, decreased energy    3     Change in appetite    1     Feeling bad or failure about yourself     1     Trouble concentrating    1     Moving slowly or fidgety/restless    1     Suicidal thoughts    0     PHQ-9 Score    16     Difficult doing work/chores    Extremely dIfficult        Review of Systems  Constitutional:  Positive for activity change.  Musculoskeletal:  Positive for back pain and neck pain.       B/L leg arm knee hip ankle pain  All other systems reviewed and are negative.     Objective:   Physical Exam  General: No acute distress HEENT: NCAT, EOMI, oral membranes moist Cards: reg rate  Chest: normal effort Abdomen: Soft, NT, ND Skin: dry, intact Extremities: no edema Psych: pleasant and appropriate  Skin: intact Neuro:Alert and oriented x 3. Normal insight and awareness. Intact Memory. Normal language and speech. Cranial nerve exam unremarkable. MMT: 5/5.   Musculoskeletal: right >left knee pain, associated swelling, crepitus. Trigger points along right trap, right rhomboid and lumbar paraspinals. Left hip tender.  Antalgic RLE. Uses cane          Assessment & Plan:  1.Chronic Bilateral Thoracic Back Pain/ Low Back Pain/ Lumbar Facet Arthropathy:  -Refilled MSIR 15 mg one tablet every 6 hours as needed  #100  -Slow weaning of Morphine.               Refilled: MS Contin 60 mg and 30 mg Q 12 hours to equal   90 mg  #60.  We will continue the controlled substance monitoring program, this consists of regular clinic visits, examinations, routine drug screening, pill counts as well as use of [place] Controlled Substance Reporting System. [place] today.  . Medication was refilled and a second prescription was sent to the patient's pharmacy for next month.       -Continue Gabapentin and Pamelor.    . 2. Degenerative Disk Disease:Encouraged Continue HEP as Tolertated  and heat therapy.               -HEP, ice and heat 3. Bilateral Greater Trochanteric Bursitis:   -ROM, ice/heat 4. Osteoarthritis of Bilateral Knees: R>L Continue current treatment modality with home exercise program and heat therapy.   5. Migraine Headaches: Increase Lamictal to 25 mg twice daily.  Likely will need further adjustment to consistently control headaches. -stable   -For breakthrough   Relpax 20 mg daily as needed.   -  6. Smoking Cessation: She has quit smoking since 08/25/2016.  I am pleased that she has stayed away from  cigarettes.   7.Constipation: Linzess has been effective.  Continue 8. Muscle Spasm: tizanidine and ROM  After informed consent and preparation of the skin with betadine and isopropyl alcohol, I injected 2cc of 1% lidocaine into the right/medial trap, right lower rhomboid and L5 paraspinals (2). . Additionally, aspiration was performed prior to injection. The patient tolerated well, and no complications were encountered. Afterward the area was cleaned and dressed. Post- injection instructions were provided.    -continue HEP, heat, ice 9. DepressionAnxiety/ Panic Attacks: Continue current medication regimen and treatment modality.  Celexa.Lamictal  and  Counseling with Coolidge Breeze and  Dr. Gilmore Laroche Psychiatrist.  2             -lamictal 10. RSD: Continue with current medication Regime with  Gabapentin.    Twenty minutes of face to face patient care time were spent during this visit. All questions were encouraged and answered.  Follow up with NP in 2 mos .

## 2022-06-14 NOTE — Addendum Note (Signed)
Addended by: Janean Sark on: 06/14/2022 11:00 AM   Modules accepted: Orders

## 2022-06-14 NOTE — Patient Instructions (Signed)
ALWAYS FEEL FREE TO CALL OUR OFFICE WITH ANY PROBLEMS OR QUESTIONS ([phone])  **PLEASE NOTE** ALL MEDICATION REFILL REQUESTS (INCLUDING CONTROLLED SUBSTANCES) NEED TO BE MADE AT LEAST 7 DAYS PRIOR TO REFILL BEING DUE. ANY REFILL REQUESTS INSIDE THAT TIME FRAME MAY RESULT IN DELAYS IN RECEIVING YOUR PRESCRIPTION.                    

## 2022-06-16 ENCOUNTER — Telehealth: Payer: Self-pay | Admitting: Family Medicine

## 2022-06-16 NOTE — Telephone Encounter (Signed)
I called pt to schedule her but she states she is not a diabetic and went and scheduled for May 28 to recheck moles and refill meds

## 2022-06-16 NOTE — Telephone Encounter (Signed)
Pt no showed last appt.  Needs to schedule /u has been more than 6 mo for diabetes etc. Also see if she wants to schedule her mammo

## 2022-06-26 ENCOUNTER — Ambulatory Visit (INDEPENDENT_AMBULATORY_CARE_PROVIDER_SITE_OTHER): Payer: Medicare Other | Admitting: Licensed Clinical Social Worker

## 2022-06-26 DIAGNOSIS — F331 Major depressive disorder, recurrent, moderate: Secondary | ICD-10-CM

## 2022-06-26 DIAGNOSIS — F411 Generalized anxiety disorder: Secondary | ICD-10-CM | POA: Diagnosis not present

## 2022-06-26 DIAGNOSIS — G894 Chronic pain syndrome: Secondary | ICD-10-CM

## 2022-06-26 NOTE — Progress Notes (Signed)
  Virtual Visit via Video Note  I connected with [name] on 06/26/22 at  8:00 AM EDT by a video enabled telemedicine application and verified that I am speaking with the correct person using two identifiers.  Location: Patient: home Provider:office   I discussed the limitations of evaluation and management by telemedicine and the availability of in person appointments. The patient expressed understanding and agreed to proceed.   I discussed the assessment and treatment plan with the patient. The patient was provided an opportunity to ask questions and all were answered. The patient agreed with the plan and demonstrated an understanding of the instructions.   The patient was advised to call back or seek an in-person evaluation if the symptoms worsen or if the condition fails to improve as anticipated.  I provided 16 minutes of non-face-to-face time during this encounter.  THERAPIST PROGRESS NOTE  Session Time: 8:00 AM to 8:16 AM  Participation Level: Active  Behavioral Response: CasualAlertDysphoric  Type of Therapy: Individual Therapy  Treatment Goals addressed:  Patient shares mood improves through interaction and therapy, this happens through positive topics, through working through stressors,   ProgressTowards Goals: Progressing-today patient not feeling well processed this provided supportive and strength-based intervention  Interventions: Solution Focused, Strength-based, Supportive, and Other: Coping  Summary: [name] is a 62 y.o. female who presents with not feeling well today has a bad headache and even the light is bothering her. Therapist noted that could tell not feeling well. Kept session short therapist provided active listening, open questions supportive and strength based interventions. .   Suicidal/Homicidal: No  Plan: Return again in 2 weeks.2.Patient utilize session to process thoughts and feelings use that for grief work  Diagnosis:  major depressive disorder, recurrent, moderate, degenerative disc disease, chronic pain syndrome, generalized anxiety disorder  Collaboration of Care: Other none needed  Patient/Guardian was advised Release of Information must be obtained prior to any record release in order to collaborate their care with an outside provider. Patient/Guardian was advised if they have not already done so to contact the registration department to sign all necessary forms in order for [place] to release information regarding their care.   Consent: Patient/Guardian gives verbal consent for treatment and assignment of benefits for services provided during this visit. Patient/Guardian expressed understanding and agreed to proceed.   Coolidge Breeze, LCSW 06/26/2022

## 2022-06-27 ENCOUNTER — Other Ambulatory Visit: Payer: Self-pay | Admitting: Registered Nurse

## 2022-06-30 ENCOUNTER — Encounter: Payer: Self-pay | Admitting: Physical Medicine & Rehabilitation

## 2022-06-30 DIAGNOSIS — G894 Chronic pain syndrome: Secondary | ICD-10-CM

## 2022-06-30 DIAGNOSIS — M1711 Unilateral primary osteoarthritis, right knee: Secondary | ICD-10-CM

## 2022-07-04 ENCOUNTER — Ambulatory Visit: Payer: Medicare Other | Admitting: Family Medicine

## 2022-07-04 MED ORDER — MORPHINE SULFATE 15 MG PO TABS
15.0000 mg | ORAL_TABLET | Freq: Four times a day (QID) | ORAL | 0 refills | Status: DC | PRN
Start: 2022-07-04 — End: 2022-08-02

## 2022-07-04 MED ORDER — MORPHINE SULFATE ER 30 MG PO TBCR
30.0000 mg | EXTENDED_RELEASE_TABLET | Freq: Two times a day (BID) | ORAL | 0 refills | Status: DC
Start: 2022-07-04 — End: 2022-08-02

## 2022-07-04 MED ORDER — MORPHINE SULFATE ER 60 MG PO TBCR: 60.0000 mg | EXTENDED_RELEASE_TABLET | Freq: Two times a day (BID) | ORAL | 0 refills | Status: DC

## 2022-07-04 NOTE — Telephone Encounter (Signed)
Rx's sent to pharmacy.  

## 2022-07-10 ENCOUNTER — Ambulatory Visit (INDEPENDENT_AMBULATORY_CARE_PROVIDER_SITE_OTHER): Payer: Medicare Other | Admitting: Licensed Clinical Social Worker

## 2022-07-10 DIAGNOSIS — G894 Chronic pain syndrome: Secondary | ICD-10-CM | POA: Diagnosis not present

## 2022-07-10 DIAGNOSIS — F411 Generalized anxiety disorder: Secondary | ICD-10-CM | POA: Diagnosis not present

## 2022-07-10 DIAGNOSIS — F331 Major depressive disorder, recurrent, moderate: Secondary | ICD-10-CM | POA: Diagnosis not present

## 2022-07-10 DIAGNOSIS — M5136 Other intervertebral disc degeneration, lumbar region: Secondary | ICD-10-CM

## 2022-07-10 NOTE — Progress Notes (Signed)
Virtual Visit via Video Note  I connected with [name] on 07/10/22 at 10:00 AM EDT by a video enabled telemedicine application and verified that I am speaking with the correct person using two identifiers.  Location: Patient: home Provider: office   I discussed the limitations of evaluation and management by telemedicine and the availability of in person appointments. The patient expressed understanding and agreed to proceed.   I discussed the assessment and treatment plan with the patient. The patient was provided an opportunity to ask questions and all were answered. The patient agreed with the plan and demonstrated an understanding of the instructions.   The patient was advised to call back or seek an in-person evaluation if the symptoms worsen or if the condition fails to improve as anticipated.  I provided 48 minutes of non-face-to-face time during this encounter.  THERAPIST PROGRESS NOTE  Session Time: 10:00 AM to 10:48 AM  Participation Level: Active  Behavioral Response: CasualAlertappropriate  Type of Therapy: Individual Therapy  Treatment Goals addressed:  Patient shares mood improves through interaction and therapy, this happens through positive topics, through working through stressors,   ProgressTowards Goals: Progressing-assess therapy continues to help patient mood enhancement and working through stressors  Interventions: Solution Focused, Strength-based, Supportive, and Other: Coping  Summary: [name] is a 62 y.o. female who presents with [name] just had a bath and trimming he is still chilly had hair cut really short. He has been cut before once knew what was going on he settled down. Talked about his name [name] patient explains a [name] to each other. Therapist noticed right by her a good companion.   Patient updated therapist on other news. [name] moving into house outside of [name].      Up and down last night. Patient says she  is vampire and mermaid during the day. Therapist knows the nickname comes from when a kid she loved the water. Would be nice to have a hot tub and therapist noted would be therapeutic. Holds up 90% of weight can do exercise can't do in a chair. Noted the water would help with pain issues and losing weight.    Talked about grocery stores goes to [employer] and shops online and [employer] goes or [place].  [name] younger sister shared room until 73. They are a year apart.  Talked about events since last session. Pays for siblings birthday all at one time. [name] and [name] see each other all the time patient is an hour away. Meet in the middle on [employer]. Like [employer], [employer]. Have fun catch up everyone's business.  [name] was master sergent of the guard taught patient learned a lot from him. On weekends he loved to cook. [name] could do no wrong first baby, [name], [name], patient was the "bad girl". Therapist wondered if that was the truth patient said depends on perspective never did anything right in mom's eyes. .      Talked about memories growing up therapist assesses helpful to focus on some positive conversation and memories for mood.  Talked about things such as her nickname being mermaid because she loves the water, learning about cooking from her dad, sleeping with her sister in the same room getting in trouble because they would be talking.  Nice memories therapist could relate from her own childhood as well so nice to share in the positive memories.  Talked about her dog what a comfort and support he is  therapist noted name of [name] is really nice and patient reinforces that its like that for both of them.  Therapist assesses helpful for patient to have an outlet as she is isolated helpful to have a place where she can process thoughts and feelings. Suicidal/Homicidal: No  Plan: Return again in 2 weeks.2.Patient utilize session to process thoughts and feelings use that for  grief work  Diagnosis: major depressive disorder, recurrent, moderate, degenerative disc disease, chronic pain syndrome, generalized anxiety disorder  Collaboration of Care: Other none needed  Patient/Guardian was advised Release of Information must be obtained prior to any record release in order to collaborate their care with an outside provider. Patient/Guardian was advised if they have not already done so to contact the registration department to sign all necessary forms in order for [place] to release information regarding their care.   Consent: Patient/Guardian gives verbal consent for treatment and assignment of benefits for services provided during this visit. Patient/Guardian expressed understanding and agreed to proceed.   Coolidge Breeze, LCSW 07/10/2022

## 2022-07-22 ENCOUNTER — Other Ambulatory Visit: Payer: Self-pay | Admitting: Registered Nurse

## 2022-07-24 ENCOUNTER — Ambulatory Visit (INDEPENDENT_AMBULATORY_CARE_PROVIDER_SITE_OTHER): Payer: Medicare Other | Admitting: Licensed Clinical Social Worker

## 2022-07-24 DIAGNOSIS — F331 Major depressive disorder, recurrent, moderate: Secondary | ICD-10-CM

## 2022-07-24 DIAGNOSIS — F411 Generalized anxiety disorder: Secondary | ICD-10-CM

## 2022-07-24 DIAGNOSIS — G894 Chronic pain syndrome: Secondary | ICD-10-CM

## 2022-07-24 DIAGNOSIS — M5136 Other intervertebral disc degeneration, lumbar region: Secondary | ICD-10-CM

## 2022-07-24 NOTE — Progress Notes (Signed)
   THERAPIST PROGRESS NOTE  Session Time: 10:00 AM to 10:16 AM  Participation Level: Active  Behavioral Response: CasualAlertDysphoric and not feeling well  Type of Therapy: Individual Therapy  Treatment Goals addressed:  Patient shares mood improves through interaction and therapy, this happens through positive topics, through working through stressors  ProgressTowards Goals: Progressing-assessed today patient not feeling well but still maintaining engagement in therapy by having session with therapist even though not feeling well  Interventions: Solution Focused, Strength-based, Supportive, and Other: Coping  Summary: [name] is a 62 y.o. female who presents with tired and headachy doesn't want to have a long session doesn't feel well.  Therapist validated patient could see patient presenting is not feeling well.  Session therapist processed thoughts and feelings provided space and support  Suicidal/Homicidal: No  Plan: Return again in 2 weeks.2.Patient utilize session to process thoughts and feelings use that for grief work  Diagnosis: major depressive disorder, recurrent, moderate, degenerative disc disease, chronic pain syndrome, generalized anxiety disorder  Collaboration of Care: Other none needed  Patient/Guardian was advised Release of Information must be obtained prior to any record release in order to collaborate their care with an outside provider. Patient/Guardian was advised if they have not already done so to contact the registration department to sign all necessary forms in order for [place] to release information regarding their care.   Consent: Patient/Guardian gives verbal consent for treatment and assignment of benefits for services provided during this visit. Patient/Guardian expressed understanding and agreed to proceed.   Coolidge Breeze, LCSW 07/24/2022

## 2022-07-25 ENCOUNTER — Other Ambulatory Visit: Payer: Self-pay | Admitting: Physical Medicine & Rehabilitation

## 2022-08-02 ENCOUNTER — Encounter: Payer: Self-pay | Admitting: Physical Medicine & Rehabilitation

## 2022-08-02 ENCOUNTER — Telehealth: Payer: Self-pay | Admitting: Registered Nurse

## 2022-08-02 DIAGNOSIS — G894 Chronic pain syndrome: Secondary | ICD-10-CM

## 2022-08-02 DIAGNOSIS — M1711 Unilateral primary osteoarthritis, right knee: Secondary | ICD-10-CM

## 2022-08-02 MED ORDER — MORPHINE SULFATE 15 MG PO TABS
15.0000 mg | ORAL_TABLET | Freq: Four times a day (QID) | ORAL | 0 refills | Status: DC | PRN
Start: 2022-08-02 — End: 2022-08-09

## 2022-08-02 MED ORDER — MORPHINE SULFATE ER 30 MG PO TBCR
30.0000 mg | EXTENDED_RELEASE_TABLET | Freq: Two times a day (BID) | ORAL | 0 refills | Status: DC
Start: 2022-08-02 — End: 2022-08-09

## 2022-08-02 MED ORDER — MORPHINE SULFATE ER 60 MG PO TBCR: 60.0000 mg | EXTENDED_RELEASE_TABLET | Freq: Two times a day (BID) | ORAL | 0 refills | Status: DC

## 2022-08-02 NOTE — Telephone Encounter (Signed)
PMP was Reviewed.  Prescription was sent to pharmacy.  MS Contin 60 mg  MS Contin 30 mg  MSIR 15 ng  Call placed to [name] regarding the above, she verbalizes understanding.

## 2022-08-04 ENCOUNTER — Telehealth: Payer: Self-pay | Admitting: Registered Nurse

## 2022-08-04 NOTE — Telephone Encounter (Signed)
Called pharmacy and  gave dx for morphine rx's. Pharmacist questioning about 240 mg daily such a high dose vs mg per day. Spoke [place] and she is aware.

## 2022-08-04 NOTE — Telephone Encounter (Signed)
Please call Toni Amend at [facility] needs further confirmation on morphine script. Per pharmacist they need clarification regarding dosage currently patient receiving 240 mg , 2 extended dosages 30 and 60, one immediate release 15.   Please call [phone]

## 2022-08-07 ENCOUNTER — Ambulatory Visit (INDEPENDENT_AMBULATORY_CARE_PROVIDER_SITE_OTHER): Payer: Medicare Other | Admitting: Licensed Clinical Social Worker

## 2022-08-07 DIAGNOSIS — G894 Chronic pain syndrome: Secondary | ICD-10-CM | POA: Diagnosis not present

## 2022-08-07 DIAGNOSIS — F411 Generalized anxiety disorder: Secondary | ICD-10-CM

## 2022-08-07 DIAGNOSIS — F331 Major depressive disorder, recurrent, moderate: Secondary | ICD-10-CM

## 2022-08-07 DIAGNOSIS — M5136 Other intervertebral disc degeneration, lumbar region: Secondary | ICD-10-CM

## 2022-08-07 NOTE — Progress Notes (Signed)
Virtual Visit via Video Note  I connected with [name] on 08/07/22 at  8:00 AM EDT by a video enabled telemedicine application and verified that I am speaking with the correct person using two identifiers.  Location: Patient: home Provider: office   I discussed the limitations of evaluation and management by telemedicine and the availability of in person appointments. The patient expressed understanding and agreed to proceed.   I discussed the assessment and treatment plan with the patient. The patient was provided an opportunity to ask questions and all were answered. The patient agreed with the plan and demonstrated an understanding of the instructions.   The patient was advised to call back or seek an in-person evaluation if the symptoms worsen or if the condition fails to improve as anticipated.  I provided 48 minutes of non-face-to-face time during this encounter.  THERAPIST PROGRESS NOTE  Session Time: 8:00 AM to 8:48 AM  Participation Level: Active  Behavioral Response: CasualAlertsubdued  Type of Therapy: Individual Therapy  Treatment Goals addressed: Patient shares mood improves through interaction and therapy, this happens through positive topics, through working through stressors  ProgressTowards Goals: Progressing-engaging in processing of emotions and therapy helpful for management of emotions  Interventions: Solution Focused, Strength-based, Supportive, and Morales: Coping  Summary: [name] is a 62 y.o. female who presents with doing alright when asked if she is feeling better after last session. Not sick doesn't know why hurting a lot lately her knees and back have really hurt not sure what it is. Rained last night. Hot weather doesn't help. Today is Mom's birthday makes her think of her a lot and [name] is the 4th of July celebrate together pissed her off because she wanted something on her birthday sometimes they did. Dad Morales on her  married an older woman. When patient started having problems in hands and nerves when conditions coming out seemed like her mom liked her better commiserate. Dad said to stop telling her or she would get it too. She wanted carpel tunnel surgery after patient had hers, she thought she had but didn't sign her up from surgery.  Explored underlying reason for patient physical issues could have been the physical aspect of her jobs. 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 Therapist said super woman and patient said had kids. Left him was hard to be alone. Was single three years. Worked harder needed extra money. Car was going to screw them up if didn't fix it so had to bring in extra money. 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   Talked about [name] right next to her. Plays with [name] and they are getting along. She is happy and loves everybody. Talked about [name] her cat was a Pharmacist, community.  Started talking about family and helping each Morales. Patient feels bad for people are are only child like [name]. Talked about family [name] was [name] by first wife. [name] the first wife. She was with him 17 years. He was with [name] 7 years with patient for almost 23 years. [name] was only child. Twins and Bert's older Morales, [name] she was 82 years younger than patient were stepchildren. Haven't seen in several years and when patient in picture they got together so worked out well. They included [name] in on everything. Doesn't have contact with them. Sister [name] talks to them once in awhile. Left him because of  patient good guy hurt him bad but couldn't stay. Stayed together for children therapist noted hard situation patient agrees and if only for children is miserable. Working all the time. No way to live. Didn't think about the kids being mad at her patient raised them they were hers when left "turned to shit". The oldest one  doesn't speak to patient.  [name] doesn't want to talk either has seen [name] used to see once in awhile. Didn't even think impact on the kids. [name] email each Morales she is her best friend. Don't know how to get through mess with [name] writing that nasty letter. Met [name] first time at 13 adopted at 56.  When younger was hell on patient working, cooking, going to grocery store helping kids with their homework. They were 7 and was with [name] 13 years.  Therapist wanted to know how long she had to do all that. Explored how it impacted her talking about memories if it was hard not having a great time anyway today still have their pictures in frames.   Today is anniversary of mom's birthday assists helpful to have a positive connection today as there were issues in the relationship impacts therapist assesses how she feels on the anniversary.  Train of conversation did review Morales aspects of patient's history was helpful for therapist to get a fuller background giving context to patient's life.  Difficulties such as when left part stepchildren became estranged.  At the same time closeness of her relationship with stepchild she adopted [name].  In the benefits of having this relationship that is helped her with coping.  Patient noted crucial for dealing with some of the difficulties in her life such as estrangement from her biological Morales [name].  Assess helpful for patient to process thoughts and feelings related to aspects of life history as well as dealing with mom's birthday anniversary.  Therapist provided active listening, open questions supportive interventions.  Suicidal/Homicidal: No  Plan: Return again in 4 weeks.2.Patient utilize session to process thoughts and feelings use that for grief work  Diagnosis: major depressive disorder, recurrent, moderate, degenerative disc disease, chronic pain syndrome, generalized anxiety disorder   Collaboration of Care: Morales none  needed  Patient/Guardian was advised Release of Information must be obtained prior to any record release in order to collaborate their care with an outside provider. Patient/Guardian was advised if they have not already done so to contact the registration department to sign all necessary forms in order for [place] to release information regarding their care.   Consent: Patient/Guardian gives verbal consent for treatment and assignment of benefits for services provided during this visit. Patient/Guardian expressed understanding and agreed to proceed.   Coolidge Breeze, LCSW 08/07/2022

## 2022-08-09 ENCOUNTER — Encounter: Payer: Self-pay | Admitting: Registered Nurse

## 2022-08-09 ENCOUNTER — Encounter: Payer: Medicare Other | Attending: Physical Medicine and Rehabilitation | Admitting: Registered Nurse

## 2022-08-09 VITALS — BP 106/64 | Temp 98.0°F | Ht 67.0 in | Wt 239.6 lb

## 2022-08-09 DIAGNOSIS — G9050 Complex regional pain syndrome I, unspecified: Secondary | ICD-10-CM | POA: Diagnosis not present

## 2022-08-09 DIAGNOSIS — M1712 Unilateral primary osteoarthritis, left knee: Secondary | ICD-10-CM | POA: Diagnosis not present

## 2022-08-09 DIAGNOSIS — G8929 Other chronic pain: Secondary | ICD-10-CM | POA: Diagnosis not present

## 2022-08-09 DIAGNOSIS — M7918 Myalgia, other site: Secondary | ICD-10-CM | POA: Diagnosis not present

## 2022-08-09 DIAGNOSIS — G894 Chronic pain syndrome: Secondary | ICD-10-CM | POA: Diagnosis not present

## 2022-08-09 DIAGNOSIS — M542 Cervicalgia: Secondary | ICD-10-CM | POA: Diagnosis not present

## 2022-08-09 DIAGNOSIS — M546 Pain in thoracic spine: Secondary | ICD-10-CM | POA: Insufficient documentation

## 2022-08-09 DIAGNOSIS — Z79891 Long term (current) use of opiate analgesic: Secondary | ICD-10-CM | POA: Diagnosis not present

## 2022-08-09 DIAGNOSIS — Z5181 Encounter for therapeutic drug level monitoring: Secondary | ICD-10-CM | POA: Diagnosis not present

## 2022-08-09 DIAGNOSIS — M1711 Unilateral primary osteoarthritis, right knee: Secondary | ICD-10-CM | POA: Insufficient documentation

## 2022-08-09 DIAGNOSIS — G43119 Migraine with aura, intractable, without status migrainosus: Secondary | ICD-10-CM | POA: Insufficient documentation

## 2022-08-09 DIAGNOSIS — M2550 Pain in unspecified joint: Secondary | ICD-10-CM | POA: Diagnosis not present

## 2022-08-09 DIAGNOSIS — M47816 Spondylosis without myelopathy or radiculopathy, lumbar region: Secondary | ICD-10-CM | POA: Insufficient documentation

## 2022-08-09 MED ORDER — MORPHINE SULFATE ER 30 MG PO TBCR
30.0000 mg | EXTENDED_RELEASE_TABLET | Freq: Two times a day (BID) | ORAL | 0 refills | Status: DC
Start: 2022-08-09 — End: 2022-09-27

## 2022-08-09 MED ORDER — MORPHINE SULFATE ER 60 MG PO TBCR: 60.0000 mg | EXTENDED_RELEASE_TABLET | Freq: Two times a day (BID) | ORAL | 0 refills | Status: DC

## 2022-08-09 MED ORDER — MORPHINE SULFATE 15 MG PO TABS
15.0000 mg | ORAL_TABLET | Freq: Four times a day (QID) | ORAL | 0 refills | Status: DC | PRN
Start: 2022-08-09 — End: 2022-09-27

## 2022-08-09 NOTE — Progress Notes (Signed)
Subjective:    Patient ID: [name], female    DOB: August 20, 1960, 62 y.o.   MRN: [id number]  HPI: [name] is a 62 y.o. female who returns for follow up appointment for chronic pain and medication refill. She states her pain is located in her neck, mid- back pain mainly left side and lower back pain. She also reports bilateral knee pain R>L. She rates her pain 8. Her current exercise regime is walking and performing stretching exercises.  Ms. Brodigan Morphine equivalent is 238.00 MME.   UDS ordered today.     Pain Inventory Average Pain 9 Pain Right Now 8 My pain is constant, sharp, burning, dull, stabbing, tingling, and aching  In the last 24 hours, has pain interfered with the following? General activity 10 Relation with others 10 Enjoyment of life 10 What TIME of day is your pain at its worst? morning , daytime, evening, and night Sleep (in general) Good  Pain is worse with: walking, bending, sitting, standing, and some activites Pain improves with: rest, heat/ice, therapy/exercise, pacing activities, medication, TENS, and injections Relief from Meds: 7  Family History  Problem Relation Age of Onset   Depression Mother    Hypertension Mother    Anxiety disorder Mother    Obesity Mother    Varicose Veins Mother    Hypertension Father    Heart failure Father    Diabetes Father    Heart attack Father    Arthritis Father    Cancer Father    Obesity Father    Colon cancer Neg Hx    Esophageal cancer Neg Hx    Stomach cancer Neg Hx    Rectal cancer Neg Hx    Social History   Socioeconomic History   Marital status: Married    Spouse name: Sullivan Lone   Number of children: 2   Years of education: some college   Highest education level: Some college, no degree  Occupational History   Occupation: On disability    Employer: UNEMPLOYED  Tobacco Use   Smoking status: Former    Packs/day: 0.00    Years: 43.00    Additional pack years: 0.00     Total pack years: 0.00    Types: Cigarettes    Quit date: 02/06/2016    Years since quitting: 6.5   Smokeless tobacco: Never  Vaping Use   Vaping Use: Never used  Substance and Sexual Activity   Alcohol use: No   Drug use: No   Sexual activity: Not Currently    Partners: Male    Birth control/protection: None  Other Topics Concern   Not on file  Social History Narrative   On disability. Lives with her husband. She has one adopted daughter. She likes to read in her free time. She does chair exercises for 30-45 mins everyday.   Social Determinants of Health   Financial Resource Strain: Low Risk  (06/30/2022)   Overall Financial Resource Strain (CARDIA)    Difficulty of Paying Living Expenses: Not hard at all  Food Insecurity: No Food Insecurity (06/30/2022)   Hunger Vital Sign    Worried About Running Out of Food in the Last Year: Never true    Ran Out of Food in the Last Year: Never true  Transportation Needs: No Transportation Needs (06/30/2022)   PRAPARE - Administrator, Civil Service (Medical): No    Lack of Transportation (Non-Medical): No  Physical Activity: Inactive (06/30/2022)   Exercise Vital Sign  Days of Exercise per Week: 0 days    Minutes of Exercise per Session: 0 min  Stress: Stress Concern Present (06/30/2022)   [employer] of Occupational Health - Occupational Stress Questionnaire    Feeling of Stress : Very much  Social Connections: Unknown (06/30/2022)   Social Connection and Isolation Panel [NHANES]    Frequency of Communication with Friends and Family: Patient declined    Frequency of Social Gatherings with Friends and Family: Patient declined    Attends Religious Services: Never    Database administrator or Organizations: No    Attends Engineer, structural: Never    Marital Status: Married   Past Surgical History:  Procedure Laterality Date   ABDOMINAL HYSTERECTOMY  06/2002   Basel Cell Carcinoma  06/2005, 07/2005   nasal  tip and reconstruction   BREAST CYST ASPIRATION     CARPAL TUNNEL RELEASE  07/1993   both hands   COLONOSCOPY     COSMETIC SURGERY  Basal cell carcinoma of nose   HEMORROIDECTOMY  08/2001   JOINT REPLACEMENT  Left knee replacement   KNEE ARTHROPLASTY  09/2001, 05/2003   left knee, chondromalasia   KNEE ARTHROSCOPY  02/2003   Right knee, tisse release, chrondromalacia   REPLACEMENT TOTAL KNEE  12/2003   Left    THYROID LOBECTOMY  01/2005   malignant area removed from right lobe   THYROID SURGERY     thyroid lobe removal   TONSILLECTOMY  09/1972   TUBAL LIGATION  10/1997   Ulnar nerve entrapment  03/1993   left elbow   Past Surgical History:  Procedure Laterality Date   ABDOMINAL HYSTERECTOMY  06/2002   Basel Cell Carcinoma  06/2005, 07/2005   nasal tip and reconstruction   BREAST CYST ASPIRATION     CARPAL TUNNEL RELEASE  07/1993   both hands   COLONOSCOPY     COSMETIC SURGERY  Basal cell carcinoma of nose   HEMORROIDECTOMY  08/2001   JOINT REPLACEMENT  Left knee replacement   KNEE ARTHROPLASTY  09/2001, 05/2003   left knee, chondromalasia   KNEE ARTHROSCOPY  02/2003   Right knee, tisse release, chrondromalacia   REPLACEMENT TOTAL KNEE  12/2003   Left    THYROID LOBECTOMY  01/2005   malignant area removed from right lobe   THYROID SURGERY     thyroid lobe removal   TONSILLECTOMY  09/1972   TUBAL LIGATION  10/1997   Ulnar nerve entrapment  03/1993   left elbow   Past Medical History:  Diagnosis Date   Allergy    Anxiety    Cancer (HCC)    cancerous nodules on thyroid   Cancer (HCC)    basal cell Cancer-nose   Chronic pain syndrome    COPD (chronic obstructive pulmonary disease) (HCC)    Degeneration of lumbar or lumbosacral intervertebral disc    Depression    Facet syndrome, lumbar    GERD (gastroesophageal reflux disease)    Hyperlipidemia    Intervertebral lumbar disc disorder with myelopathy, lumbar region    Lumbosacral spondylosis without myelopathy     Migraine without aura, with intractable migraine, so stated, without mention of status migrainosus    Primary localized osteoarthrosis, lower leg    Thyroid disease    hypothyroid   Unspecified musculoskeletal disorders and symptoms referable to neck    cervical/trapezius   BP 106/64   Temp 98 F (36.7 C)   Ht 5\' 7"  (1.702 m)   Wt 239  lb 9.6 oz (108.7 kg)   SpO2 96%   BMI 37.53 kg/m   Opioid Risk Score:   Fall Risk Score:  `1  Depression screen [facility] 2/9     08/09/2022    8:44 AM 06/14/2022    9:23 AM 05/09/2022    8:21 AM 04/07/2022    8:52 AM 03/10/2022    8:13 AM 01/19/2022    2:12 PM 01/10/2022    8:56 AM  Depression screen PHQ 2/9  Decreased Interest 3 3 1 1 1 3  0  Down, Depressed, Hopeless 3 3 1 1 1 3  0  PHQ - 2 Score 6 6 2 2 2 6  0  Altered sleeping      3   Tired, decreased energy      3   Change in appetite      1   Feeling bad or failure about yourself       1   Trouble concentrating      1   Moving slowly or fidgety/restless      1   Suicidal thoughts      0   PHQ-9 Score      16   Difficult doing work/chores      Extremely dIfficult      Review of Systems  Constitutional: Negative.   HENT: Negative.    Eyes: Negative.   Respiratory: Negative.    Cardiovascular: Negative.   Gastrointestinal: Negative.   Endocrine: Negative.   Genitourinary: Negative.   Musculoskeletal:  Positive for arthralgias, back pain, gait problem and myalgias.  Skin: Negative.   Allergic/Immunologic: Negative.   Hematological: Negative.   Psychiatric/Behavioral:  Positive for dysphoric mood.   All other systems reviewed and are negative.      Objective:   Physical Exam Vitals and nursing note reviewed.  Constitutional:      Appearance: Normal appearance.  Cardiovascular:     Rate and Rhythm: Normal rate and regular rhythm.     Pulses: Normal pulses.     Heart sounds: Normal heart sounds.  Pulmonary:     Effort: Pulmonary effort is normal.     Breath sounds: Normal breath  sounds.  Musculoskeletal:     Cervical back: Normal range of motion and neck supple.     Comments: Normal Muscle Bulk and Muscle Testing Reveals:  Upper Extremities: Full ROM and Muscle Strength 5/5 Thoracic Paraspinal Tenderness: T-7-T-9 Mainly Left Side  Lumbar Paraspinal Tenderness: L-3-L-5 Lower Extremities: Decreased ROM and Muscle Strength 5/5 Bilateral Lower Extremities Flexion Produces Pain into her Bilateral Patella's  Arises from Table slowly using cane for support Antalgic  Gait     Skin:    General: Skin is warm and dry.  Neurological:     Mental Status: She is alert and oriented to person, place, and time.  Psychiatric:        Mood and Affect: Mood normal.        Behavior: Behavior normal.         Assessment & Plan:  1.Chronic Bilateral Thoracic Back Pain: Schedule Trigger Point Injection with Dr Riley Kill.  Low Back Pain/ Lumbar Facet Arthropathy: Continue MSIR 15 mg one tablet every 6 hours as needed #100 and   Refilled: MS Contin 60 mg and MS Contin  30 mg Q 12 hours to equal   90 mg  #60/60.Marland Kitchen Continue Gabapentin and Pamelor. 08/09/2022 We will continue the opioid monitoring program, this consists of regular clinic visits, examinations, urine drug screen, pill counts as  well as use of [place] Controlled Substance Reporting system. A 12 month History has been reviewed on the [place] Controlled Substance Reporting System on 08/09/2022. 2. Degenerative Disk Disease:Encouraged Continue HEP as Tolertated  and heat therapy. Continue Current Medication Regime.08/09/2022 3. Bilateral Greater Trochanteric Bursitis:  Continue current treatment regimen with Heat and Ice Therapy. 08/09/2022 4. Osteoarthritis of Bilateral Knees: R>L Continue current treatment modality with home exercise program and heat therapy. 08/09/2022 5. Migraine Headaches: Continue current medication regimen  Continue with  Migraine Journal. Aimovig discontinued due to Hives. Continue Lamictal and  Relpax. Continue to Monitor.  08/09/2022 6. Smoking Cessation: She has quit smoking since 08/25/2016. Continue to Monitor. 08/09/2022 7.Constipation: Continue  Current medication regime with Linzess. 08/09/2022 8. Muscle Spasm: Continue current medication regime with Tizanidine.08/09/2022 9. DepressionAnxiety/ Panic Attacks: Continue current medication regimen and treatment modality.  Celexa.Lamictal  and Counseling with Coolidge Breeze and  Dr. Gilmore Laroche Psychiatrist. 08/09/2022 10. RSD: Continue with current medication Regime with Gabapentin. 08/09/2022   F/U in 1 month

## 2022-08-11 ENCOUNTER — Ambulatory Visit: Payer: Medicare Other | Admitting: Registered Nurse

## 2022-08-14 ENCOUNTER — Ambulatory Visit: Payer: Medicare Other | Admitting: Registered Nurse

## 2022-08-14 LAB — TOXASSURE SELECT,+ANTIDEPR,UR

## 2022-08-23 ENCOUNTER — Other Ambulatory Visit: Payer: Self-pay | Admitting: Family Medicine

## 2022-08-23 DIAGNOSIS — N3281 Overactive bladder: Secondary | ICD-10-CM

## 2022-08-30 ENCOUNTER — Telehealth: Payer: Self-pay | Admitting: Family Medicine

## 2022-08-31 ENCOUNTER — Other Ambulatory Visit: Payer: Self-pay | Admitting: Registered Nurse

## 2022-09-04 ENCOUNTER — Encounter (HOSPITAL_COMMUNITY): Payer: Self-pay

## 2022-09-04 ENCOUNTER — Ambulatory Visit (INDEPENDENT_AMBULATORY_CARE_PROVIDER_SITE_OTHER): Payer: Self-pay | Admitting: Licensed Clinical Social Worker

## 2022-09-04 DIAGNOSIS — F331 Major depressive disorder, recurrent, moderate: Secondary | ICD-10-CM

## 2022-09-04 DIAGNOSIS — F411 Generalized anxiety disorder: Secondary | ICD-10-CM

## 2022-09-04 NOTE — Progress Notes (Signed)
Therapist contacted patient through My Chart for session and she did not respond

## 2022-09-18 ENCOUNTER — Encounter (HOSPITAL_COMMUNITY): Payer: Self-pay

## 2022-09-18 ENCOUNTER — Ambulatory Visit (INDEPENDENT_AMBULATORY_CARE_PROVIDER_SITE_OTHER): Payer: Medicare Other | Admitting: Licensed Clinical Social Worker

## 2022-09-18 DIAGNOSIS — F331 Major depressive disorder, recurrent, moderate: Secondary | ICD-10-CM

## 2022-09-18 DIAGNOSIS — F411 Generalized anxiety disorder: Secondary | ICD-10-CM

## 2022-09-18 NOTE — Progress Notes (Signed)
Called patient for our session. Her husband related that she was not doing well, she had been up all night, knees were very painful and she had just gotten to sleep. Session cancelled due to patient not doing well today.

## 2022-09-19 NOTE — Telephone Encounter (Signed)
vom

## 2022-09-26 NOTE — Progress Notes (Unsigned)
Subjective:    Patient ID: [name], female    DOB: [birth year], 62 y.o.   MRN: [id number]  HPI: [name] is a 62 y.o. female who returns for follow up appointment for chronic pain and medication refill. She states her pain is located in her neck, mid- lower back, bilateral hip pain. Also reports bilateral knee pain R>L and generalized joint pain. She rates her pain 8. Her current exercise regime is walking and performing stretching exercises.  [name] Morphine equivalent is 230.00 MME.   Last UDS was Performed on 08/09/2022, it was consistent.    Pain Inventory Average Pain 9 Pain Right Now 8 My pain is constant, sharp, burning, dull, stabbing, tingling, and aching  In the last 24 hours, has pain interfered with the following? General activity 10 Relation with others 10 Enjoyment of life 10 What TIME of day is your pain at its worst? morning , daytime, evening, and night Sleep (in general) Poor  Pain is worse with: walking, bending, standing, and some activites Pain improves with: rest, heat/ice, therapy/exercise, and medication Relief from Meds: 5  Family History  Problem Relation Age of Onset   Depression Mother    Hypertension Mother    Anxiety disorder Mother    Obesity Mother    Varicose Veins Mother    Hypertension Father    Heart failure Father    Diabetes Father    Heart attack Father    Arthritis Father    Cancer Father    Obesity Father    Colon cancer Neg Hx    Esophageal cancer Neg Hx    Stomach cancer Neg Hx    Rectal cancer Neg Hx    Social History   Socioeconomic History   Marital status: Married    Spouse name: Sullivan Lone   Number of children: 2   Years of education: some college   Highest education level: Some college, no degree  Occupational History   Occupation: On disability    Employer: UNEMPLOYED  Tobacco Use   Smoking status: Former    Current packs/day: 0.00    Types: Cigarettes    Quit date: 02/05/1973     Years since quitting: 49.6   Smokeless tobacco: Never  Vaping Use   Vaping status: Never Used  Substance and Sexual Activity   Alcohol use: No   Drug use: No   Sexual activity: Not Currently    Partners: Male    Birth control/protection: None  Other Topics Concern   Not on file  Social History Narrative   On disability. Lives with her husband. She has one adopted daughter. She likes to read in her free time. She does chair exercises for 30-45 mins everyday.   Social Determinants of Health   Financial Resource Strain: Low Risk  (06/30/2022)   Overall Financial Resource Strain (CARDIA)    Difficulty of Paying Living Expenses: Not hard at all  Food Insecurity: No Food Insecurity (06/30/2022)   Hunger Vital Sign    Worried About Running Out of Food in the Last Year: Never true    Ran Out of Food in the Last Year: Never true  Transportation Needs: No Transportation Needs (06/30/2022)   PRAPARE - Administrator, Civil Service (Medical): No    Lack of Transportation (Non-Medical): No  Physical Activity: Inactive (06/30/2022)   Exercise Vital Sign    Days of Exercise per Week: 0 days    Minutes of Exercise per Session: 0 min  Stress: Stress Concern Present (06/30/2022)   [employer] of Occupational Health - Occupational Stress Questionnaire    Feeling of Stress : Very much  Social Connections: Unknown (06/30/2022)   Social Connection and Isolation Panel [NHANES]    Frequency of Communication with Friends and Family: Patient declined    Frequency of Social Gatherings with Friends and Family: Patient declined    Attends Religious Services: Never    Database administrator or Organizations: No    Attends Engineer, structural: Never    Marital Status: Married   Past Surgical History:  Procedure Laterality Date   ABDOMINAL HYSTERECTOMY  06/2002   Basel Cell Carcinoma  06/2005, 07/2005   nasal tip and reconstruction   BREAST CYST ASPIRATION     CARPAL TUNNEL  RELEASE  07/1993   both hands   COLONOSCOPY     COSMETIC SURGERY  Basal cell carcinoma of nose   HEMORROIDECTOMY  08/2001   JOINT REPLACEMENT  Left knee replacement   KNEE ARTHROPLASTY  09/2001, 05/2003   left knee, chondromalasia   KNEE ARTHROSCOPY  02/2003   Right knee, tisse release, chrondromalacia   REPLACEMENT TOTAL KNEE  12/2003   Left    THYROID LOBECTOMY  01/2005   malignant area removed from right lobe   THYROID SURGERY     thyroid lobe removal   TONSILLECTOMY  09/1972   TUBAL LIGATION  10/1997   Ulnar nerve entrapment  03/1993   left elbow   Past Surgical History:  Procedure Laterality Date   ABDOMINAL HYSTERECTOMY  06/2002   Basel Cell Carcinoma  06/2005, 07/2005   nasal tip and reconstruction   BREAST CYST ASPIRATION     CARPAL TUNNEL RELEASE  07/1993   both hands   COLONOSCOPY     COSMETIC SURGERY  Basal cell carcinoma of nose   HEMORROIDECTOMY  08/2001   JOINT REPLACEMENT  Left knee replacement   KNEE ARTHROPLASTY  09/2001, 05/2003   left knee, chondromalasia   KNEE ARTHROSCOPY  02/2003   Right knee, tisse release, chrondromalacia   REPLACEMENT TOTAL KNEE  12/2003   Left    THYROID LOBECTOMY  01/2005   malignant area removed from right lobe   THYROID SURGERY     thyroid lobe removal   TONSILLECTOMY  09/1972   TUBAL LIGATION  10/1997   Ulnar nerve entrapment  03/1993   left elbow   Past Medical History:  Diagnosis Date   Allergy    Anxiety    Cancer (HCC)    cancerous nodules on thyroid   Cancer (HCC)    basal cell Cancer-nose   Chronic pain syndrome    COPD (chronic obstructive pulmonary disease) (HCC)    Degeneration of lumbar or lumbosacral intervertebral disc    Depression    Facet syndrome, lumbar    GERD (gastroesophageal reflux disease)    Hyperlipidemia    Intervertebral lumbar disc disorder with myelopathy, lumbar region    Lumbosacral spondylosis without myelopathy    Migraine without aura, with intractable migraine, so stated,  without mention of status migrainosus    Primary localized osteoarthrosis, lower leg    Thyroid disease    hypothyroid   Unspecified musculoskeletal disorders and symptoms referable to neck    cervical/trapezius   BP 104/65   Pulse 92   Ht 5\' 7"  (1.702 m)   Wt 253 lb (114.8 kg)   SpO2 95%   BMI 39.63 kg/m   Opioid Risk Score:   Fall Risk  Score:  `1  Depression screen [facility] 2/9     09/27/2022    8:25 AM 08/09/2022    8:44 AM 06/14/2022    9:23 AM 05/09/2022    8:21 AM 04/07/2022    8:52 AM 03/10/2022    8:13 AM 01/19/2022    2:12 PM  Depression screen PHQ 2/9  Decreased Interest 1 3 3 1 1 1 3   Down, Depressed, Hopeless 1 3 3 1 1 1 3   PHQ - 2 Score 2 6 6 2 2 2 6   Altered sleeping       3  Tired, decreased energy       3  Change in appetite       1  Feeling bad or failure about yourself        1  Trouble concentrating       1  Moving slowly or fidgety/restless       1  Suicidal thoughts       0  PHQ-9 Score       16  Difficult doing work/chores       Extremely dIfficult    Review of Systems  Musculoskeletal:  Positive for back pain, gait problem and neck pain.       Pain in right knee, elbows, both upper legs & both wrist  All other systems reviewed and are negative.      Objective:   Physical Exam Vitals and nursing note reviewed.  Constitutional:      Appearance: Normal appearance.  Neck:     Comments: Cervical Paraspinal Tenderness: C-5-C-6 Cardiovascular:     Rate and Rhythm: Normal rate and regular rhythm.     Pulses: Normal pulses.     Heart sounds: Normal heart sounds.  Pulmonary:     Effort: Pulmonary effort is normal.     Breath sounds: Normal breath sounds.  Musculoskeletal:     Cervical back: Normal range of motion and neck supple.     Comments: Normal Muscle Bulk and Muscle Testing Reveals:  Upper Extremities: Full ROM and Muscle Strength 5/5 Thoracic Paraspinal Tenderness: T-7-T-9 Mainly Left Side Lumbar Paraspinal Tenderness: L-3-L-5 Lower  Extremities: Decreased ROM and Muscle Strength 5/5 Bilateral Lower Extremities Flexion Produces Pain into his Bilateral Patella's Arises from Table slowly using Cane for support Antalgic Gait     Skin:    General: Skin is warm and dry.  Neurological:     Mental Status: She is alert and oriented to person, place, and time.  Psychiatric:        Mood and Affect: Mood normal.        Behavior: Behavior normal.         Assessment & Plan:  1.Chronic Bilateral Thoracic Back Pain: Schedule Trigger Point Injection with Dr Riley Kill.  Low Back Pain/ Lumbar Facet Arthropathy: Continue MSIR 15 mg one tablet every 6 hours as needed #100 and   Refilled: MS Contin 60 mg and MS Contin  30 mg Q 12 hours to equal   90 mg  #60/60.Marland Kitchen Second script sent to pharmacy to accommodate to scheduled appointment.  Continue Gabapentin and Pamelor. 09/27/2022 We will continue the opioid monitoring program, this consists of regular clinic visits, examinations, urine drug screen, pill counts as well as use of [place] Controlled Substance Reporting system. A 12 month History has been reviewed on the [place] Controlled Substance Reporting System on 09/27/2022. 2. Degenerative Disk Disease:Encouraged Continue HEP as Tolertated  and heat therapy. Continue Current Medication Regime.09/27/2022  3. Bilateral Greater Trochanteric Bursitis:  Continue current treatment regimen with Heat and Ice Therapy. 09/27/2022 4. Osteoarthritis of Bilateral Knees: R>L Continue current treatment modality with home exercise program and heat therapy. 09/27/2022 5. Migraine Headaches: Continue current medication regimen  Continue with  Migraine Journal. Aimovig discontinued due to Hives. Continue Lamictal and Relpax. Continue to Monitor.  09/27/2022 6. Smoking Cessation: She has quit smoking since 08/25/2016. Continue to Monitor. 09/27/2022 7.Constipation: Continue  Current medication regime with Linzess. 09/27/2022 8. Muscle Spasm:  Continue current medication regime with Tizanidine.09/27/2022 9. DepressionAnxiety/ Panic Attacks: Continue current medication regimen and treatment modality.  Celexa.Lamictal  and Counseling with Coolidge Breeze and  Dr. Gilmore Laroche Psychiatrist. 09/27/2022 10. RSD: Continue with current medication Regime with Gabapentin. 09/27/2022   F/U in 2 months

## 2022-09-27 ENCOUNTER — Encounter: Payer: Medicare Other | Attending: Physical Medicine and Rehabilitation | Admitting: Registered Nurse

## 2022-09-27 ENCOUNTER — Encounter: Payer: Self-pay | Admitting: Registered Nurse

## 2022-09-27 VITALS — BP 104/65 | HR 92 | Ht 67.0 in | Wt 253.0 lb

## 2022-09-27 DIAGNOSIS — M1711 Unilateral primary osteoarthritis, right knee: Secondary | ICD-10-CM

## 2022-09-27 DIAGNOSIS — M7062 Trochanteric bursitis, left hip: Secondary | ICD-10-CM | POA: Diagnosis not present

## 2022-09-27 DIAGNOSIS — G8929 Other chronic pain: Secondary | ICD-10-CM

## 2022-09-27 DIAGNOSIS — Z5181 Encounter for therapeutic drug level monitoring: Secondary | ICD-10-CM

## 2022-09-27 DIAGNOSIS — M542 Cervicalgia: Secondary | ICD-10-CM | POA: Diagnosis not present

## 2022-09-27 DIAGNOSIS — G894 Chronic pain syndrome: Secondary | ICD-10-CM

## 2022-09-27 DIAGNOSIS — M1712 Unilateral primary osteoarthritis, left knee: Secondary | ICD-10-CM

## 2022-09-27 DIAGNOSIS — Z79891 Long term (current) use of opiate analgesic: Secondary | ICD-10-CM | POA: Diagnosis not present

## 2022-09-27 DIAGNOSIS — M7061 Trochanteric bursitis, right hip: Secondary | ICD-10-CM | POA: Diagnosis not present

## 2022-09-27 DIAGNOSIS — M47816 Spondylosis without myelopathy or radiculopathy, lumbar region: Secondary | ICD-10-CM | POA: Diagnosis not present

## 2022-09-27 DIAGNOSIS — M2550 Pain in unspecified joint: Secondary | ICD-10-CM

## 2022-09-27 DIAGNOSIS — M546 Pain in thoracic spine: Secondary | ICD-10-CM | POA: Diagnosis not present

## 2022-09-27 DIAGNOSIS — M7918 Myalgia, other site: Secondary | ICD-10-CM | POA: Diagnosis not present

## 2022-09-27 MED ORDER — MORPHINE SULFATE ER 30 MG PO TBCR
30.0000 mg | EXTENDED_RELEASE_TABLET | Freq: Two times a day (BID) | ORAL | 0 refills | Status: DC
Start: 2022-09-27 — End: 2022-11-28

## 2022-09-27 MED ORDER — LAMOTRIGINE 25 MG PO TABS: 25.0000 mg | ORAL_TABLET | Freq: Two times a day (BID) | ORAL | 2 refills | Status: DC

## 2022-09-27 MED ORDER — MORPHINE SULFATE 15 MG PO TABS
15.0000 mg | ORAL_TABLET | Freq: Four times a day (QID) | ORAL | 0 refills | Status: DC | PRN
Start: 2022-09-27 — End: 2022-11-28

## 2022-09-27 MED ORDER — MORPHINE SULFATE ER 30 MG PO TBCR
30.0000 mg | EXTENDED_RELEASE_TABLET | Freq: Two times a day (BID) | ORAL | 0 refills | Status: DC
Start: 2022-09-27 — End: 2022-09-27

## 2022-09-27 MED ORDER — MORPHINE SULFATE 15 MG PO TABS
15.0000 mg | ORAL_TABLET | Freq: Four times a day (QID) | ORAL | 0 refills | Status: DC | PRN
Start: 2022-09-27 — End: 2022-09-27

## 2022-09-27 MED ORDER — MORPHINE SULFATE ER 60 MG PO TBCR: 60.0000 mg | EXTENDED_RELEASE_TABLET | Freq: Two times a day (BID) | ORAL | 0 refills | Status: DC

## 2022-09-29 ENCOUNTER — Other Ambulatory Visit: Payer: Self-pay | Admitting: Registered Nurse

## 2022-09-29 ENCOUNTER — Telehealth: Payer: Self-pay | Admitting: *Deleted

## 2022-09-29 NOTE — Telephone Encounter (Signed)
Received a call from [facility] pharmacist with [facility] wanting to speak with a provider about her concerns with the amount of opioid medication that [name] is receiving. [name] is out of the office . [phone]

## 2022-10-01 ENCOUNTER — Other Ambulatory Visit: Payer: Self-pay | Admitting: Registered Nurse

## 2022-10-04 ENCOUNTER — Ambulatory Visit (HOSPITAL_COMMUNITY): Payer: Medicare Other | Admitting: Licensed Clinical Social Worker

## 2022-10-04 DIAGNOSIS — F411 Generalized anxiety disorder: Secondary | ICD-10-CM | POA: Diagnosis not present

## 2022-10-04 DIAGNOSIS — M5136 Other intervertebral disc degeneration, lumbar region: Secondary | ICD-10-CM

## 2022-10-04 DIAGNOSIS — G894 Chronic pain syndrome: Secondary | ICD-10-CM

## 2022-10-04 DIAGNOSIS — F331 Major depressive disorder, recurrent, moderate: Secondary | ICD-10-CM | POA: Diagnosis not present

## 2022-10-04 NOTE — Progress Notes (Signed)
Virtual Visit via Video Note  I connected with [name] on 10/04/22 at  8:00 AM EDT by a video enabled telemedicine application and verified that I am speaking with the correct person using two identifiers.  Location: Patient: home Provider: home office   I discussed the limitations of evaluation and management by telemedicine and the availability of in person appointments. The patient expressed understanding and agreed to proceed.   I discussed the assessment and treatment plan with the patient. The patient was provided an opportunity to ask questions and all were answered. The patient agreed with the plan and demonstrated an understanding of the instructions.   The patient was advised to call back or seek an in-person evaluation if the symptoms worsen or if the condition fails to improve as anticipated.  I provided 48 minutes of non-face-to-face time during this encounter.  THERAPIST PROGRESS NOTE  Session Time: 8:00 AM to 8:48 AM  Participation Level: Active  Behavioral Response: CasualAlertappropriate  Type of Therapy: Individual Therapy  Treatment Goals addressed: Patient shares mood improves through interaction and therapy, this happens through positive topics, through working through stressors  ProgressTowards Goals: Progressing-patient has medical issues including pain isolation therapeutic interventions help with her mental health  Interventions: Solution Focused, Strength-based, Supportive, and Other: coping  Summary: [name] is a 62 y.o. female who presents with lately been reading not on tablet. Read the last new thing yesterday now doesn't know what to read. Eight books this week only Wednesday. 7 are series one was add on to series she read before. Likes to read anything.  Therapist can see and she says she reads fast.  Talked more what the plots were for some the books. Talked about what she is watching on [place]. Watching [employer] has read the  book. Into her music as well as books likes a little bit of everything.  Therapist has that in common with her.  Transitioned to talking about Genevie Cheshire and Phil Dopp and how they are so helpful to patient. [place] will [place] in a year. They moved so many times. She is so happy don't know when get there to see her. Watch the food Channel and cooking network. Talked about a show where the kids were cooking and amazing what they were able to cook. They have the adult one spring championship, summer and holiday show. Deniece Ree has a bunch of shows. Love to cook can't cook miss her cooking. Cheese potatoes, creme gravy.  Also enjoys cooking shows shared has a [employer] with different cooks another avenue for looking at cooks and recipes.  Patient likes reading recipes two different recipes places in Pinterest one is sweets and one is everything. Close to 30,000 recipes. Talked about her dog who stays by her.  Drives her crazy but also a good support.  Patient to have sessions she is isolated because of medical issues helpful to connect in a therapeutic session, helpful to externalize managing stressors.  In this session highlighted some of the positive activities or engagement in different types that are good for mental health such as books, watching different shows.  Therapist enjoys this as well has that in common assist helpful for mood to talk about these different interests plots of different books favorite shows.  Notes at end of session assess mood is enhanced by engagement reticulate relationship and supportive and strength based interventions.   Suicidal/Homicidal: No  Plan: Return again in  2 weeks.2.  Work on Pharmacologist for stressors process thoughts and feelings in session  Diagnosis: major depressive disorder, recurrent, moderate, degenerative disc disease, chronic pain syndrome, generalized anxiety disorder  Collaboration of Care: Other none  needed  Patient/Guardian was advised Release of Information must be obtained prior to any record release in order to collaborate their care with an outside provider. Patient/Guardian was advised if they have not already done so to contact the registration department to sign all necessary forms in order for [place] to release information regarding their care.   Consent: Patient/Guardian gives verbal consent for treatment and assignment of benefits for services provided during this visit. Patient/Guardian expressed understanding and agreed to proceed.   Coolidge Breeze, [place] 10/04/2022

## 2022-10-16 ENCOUNTER — Ambulatory Visit (INDEPENDENT_AMBULATORY_CARE_PROVIDER_SITE_OTHER): Payer: Medicare Other | Admitting: Licensed Clinical Social Worker

## 2022-10-16 DIAGNOSIS — F411 Generalized anxiety disorder: Secondary | ICD-10-CM

## 2022-10-16 DIAGNOSIS — M5136 Other intervertebral disc degeneration, lumbar region: Secondary | ICD-10-CM

## 2022-10-16 DIAGNOSIS — G894 Chronic pain syndrome: Secondary | ICD-10-CM | POA: Diagnosis not present

## 2022-10-16 DIAGNOSIS — F331 Major depressive disorder, recurrent, moderate: Secondary | ICD-10-CM | POA: Diagnosis not present

## 2022-10-16 NOTE — Progress Notes (Signed)
Virtual Visit via Video Note  I connected with [name] on 10/16/22 at  8:00 AM EDT by a video enabled telemedicine application and verified that I am speaking with the correct person using two identifiers.  Location: Patient: home Provider: office   I discussed the limitations of evaluation and management by telemedicine and the availability of in person appointments. The patient expressed understanding and agreed to proceed.   I discussed the assessment and treatment plan with the patient. The patient was provided an opportunity to ask questions and all were answered. The patient agreed with the plan and demonstrated an understanding of the instructions.   The patient was advised to call back or seek an in-person evaluation if the symptoms worsen or if the condition fails to improve as anticipated.  I provided 30 minutes of non-face-to-face time during this encounter.  THERAPIST PROGRESS NOTE  Session Time: 8:00 AM to 8:30 AM  Participation Level: Active  Behavioral Response: CasualAlertDysphoric  Type of Therapy: Individual Therapy  Treatment Goals addressed: Patient shares mood improves through interaction and therapy, this happens through positive topics, through working through stressors  ProgressTowards Goals: Progressing-patient has significant medical issues therapist providing supportive and strength-based interventions assess helpful in managing her medical issues  Interventions: Solution Focused, Play Therapy, and Supportive  Summary: [name] is a 62 y.o. female who presents with patient not feeling well this morning. She was up all night with a headache. Therapist said this is to be expected with our work together given her medical issues. Patient did not feel like a long session given that she did not feel well. Therapist processed thoughts and feelings in session as well as providing strength based and supportive interventions. Therapist noted  patient's resources and resiliency.   Suicidal/Homicidal: No  Plan: Return again in 2 weeks.2.Work on Pharmacologist for stressors process thoughts and feelings in session   Diagnosis: major depressive disorder, recurrent, moderate, degenerative disc disease, chronic pain syndrome, generalized anxiety disorder  Collaboration of Care: Other none needed  Patient/Guardian was advised Release of Information must be obtained prior to any record release in order to collaborate their care with an outside provider. Patient/Guardian was advised if they have not already done so to contact the registration department to sign all necessary forms in order for [place] to release information regarding their care.   Consent: Patient/Guardian gives verbal consent for treatment and assignment of benefits for services provided during this visit. Patient/Guardian expressed understanding and agreed to proceed.   Frances Breeze, LCSW 10/16/2022

## 2022-10-23 ENCOUNTER — Other Ambulatory Visit: Payer: Self-pay | Admitting: Registered Nurse

## 2022-11-01 ENCOUNTER — Ambulatory Visit (INDEPENDENT_AMBULATORY_CARE_PROVIDER_SITE_OTHER): Payer: Medicare Other | Admitting: Licensed Clinical Social Worker

## 2022-11-01 DIAGNOSIS — F411 Generalized anxiety disorder: Secondary | ICD-10-CM | POA: Diagnosis not present

## 2022-11-01 DIAGNOSIS — F331 Major depressive disorder, recurrent, moderate: Secondary | ICD-10-CM

## 2022-11-01 DIAGNOSIS — G894 Chronic pain syndrome: Secondary | ICD-10-CM
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
## 2022-11-16 ENCOUNTER — Other Ambulatory Visit: Payer: Self-pay | Admitting: Registered Nurse

## 2022-11-20 ENCOUNTER — Ambulatory Visit (INDEPENDENT_AMBULATORY_CARE_PROVIDER_SITE_OTHER): Payer: Medicare Other | Admitting: Licensed Clinical Social Worker

## 2022-11-20 DIAGNOSIS — F411 Generalized anxiety disorder: Secondary | ICD-10-CM

## 2022-11-20 DIAGNOSIS — G894 Chronic pain syndrome: Secondary | ICD-10-CM

## 2022-11-20 DIAGNOSIS — F331 Major depressive disorder, recurrent, moderate: Secondary | ICD-10-CM | POA: Diagnosis not present

## 2022-11-20 NOTE — Progress Notes (Signed)
Virtual Visit via Video Note  I connected with [name] on 11/20/22 at  8:00 AM EDT by a video enabled telemedicine application and verified that I am speaking with the correct person using two identifiers.  Location: Patient: home Provider: office   I discussed the limitations of evaluation and management by telemedicine and the availability of in person appointments. The patient expressed understanding and agreed to proceed.  I discussed the assessment and treatment plan with the patient. The patient was provided an opportunity to ask questions and all were answered. The patient agreed with the plan and demonstrated an understanding of the instructions.   The patient was advised to call back or seek an in-person evaluation if the symptoms worsen or if the condition fails to improve as anticipated.  I provided 40 minutes of non-face-to-face time during this encounter.  THERAPIST PROGRESS NOTE  Session Time: 8:00 AM to 8:40 AM  Participation Level: Active  Behavioral Response: CasualAlertDysphoric  Type of Therapy: Individual Therapy  Treatment Goals addressed: Patient shares mood improves through interaction and therapy, this happens through positive topics, through working through stressors  ProgressTowards Goals: Progressing-patient not feeling well this morning has a headache tried to have session noted therapeutic value of having somebody who cares about her she realizes  Interventions: Solution Focused, Strength-based, Supportive, and Other: coping  Summary: LETANYA FROH is a 62 y.o. female who presents with headache up all night [name], her dog, kept her up all night. Just started out of nowhere yesterday afternoon all of sudden. If it comes on slow the medicine will stop it if doesn't stop it then then can take two pills in two hours can't have more than two pills stop it but then comes a couple days later. This time hit her all at once and then the  medication doesn't work. . Talked about various subjects during session helpful for interest and mood. Concerns about the current state of politics that what you hear is scary. Doesn't watch politics and the news watch things on home page articles and read that.  Therapist noted she is will inform when she does that.  Provider used empathetic listening direct questioning current and underlying problems which includes isolation and headache, response includes supportive intervention patient resting.  Noted identifying commonalities as supportive noting patient strengths her insights that therapist responded to and provided positive feedback about    Suicidal/Homicidal: No  Plan: Return again in 2 weeks.2.Work on Pharmacologist for stressors process thoughts and feelings in session  Diagnosis: major depressive disorder, recurrent, moderate, degenerative disc disease, chronic pain syndrome, generalized anxiety disorder  Collaboration of Care: Other none needed  Patient/Guardian was advised Release of Information must be obtained prior to any record release in order to collaborate their care with an outside provider. Patient/Guardian was advised if they have not already done so to contact the registration department to sign all necessary forms in order for [place] to release information regarding their care.   Consent: Patient/Guardian gives verbal consent for treatment and assignment of benefits for services provided during this visit. Patient/Guardian expressed understanding and agreed to proceed.   Coolidge Breeze, LCSW 11/20/2022

## 2022-11-28 ENCOUNTER — Encounter: Payer: Self-pay | Admitting: Registered Nurse

## 2022-11-28 ENCOUNTER — Encounter: Payer: Medicare Other | Attending: Physical Medicine and Rehabilitation | Admitting: Registered Nurse

## 2022-11-28 VITALS — BP 108/68 | HR 88 | Ht 67.0 in | Wt 254.0 lb

## 2022-11-28 DIAGNOSIS — M7062 Trochanteric bursitis, left hip: Secondary | ICD-10-CM | POA: Diagnosis not present

## 2022-11-28 DIAGNOSIS — Z79891 Long term (current) use of opiate analgesic: Secondary | ICD-10-CM | POA: Insufficient documentation

## 2022-11-28 DIAGNOSIS — M1712 Unilateral primary osteoarthritis, left knee: Secondary | ICD-10-CM | POA: Insufficient documentation

## 2022-11-28 DIAGNOSIS — G894 Chronic pain syndrome: Secondary | ICD-10-CM | POA: Diagnosis not present

## 2022-11-28 DIAGNOSIS — Z5181 Encounter for therapeutic drug level monitoring: Secondary | ICD-10-CM | POA: Diagnosis not present

## 2022-11-28 DIAGNOSIS — M1711 Unilateral primary osteoarthritis, right knee: Secondary | ICD-10-CM | POA: Insufficient documentation

## 2022-11-28 DIAGNOSIS — G8929 Other chronic pain: Secondary | ICD-10-CM | POA: Insufficient documentation

## 2022-11-28 DIAGNOSIS — M47816 Spondylosis without myelopathy or radiculopathy, lumbar region: Secondary | ICD-10-CM | POA: Insufficient documentation

## 2022-11-28 DIAGNOSIS — M2550 Pain in unspecified joint: Secondary | ICD-10-CM | POA: Diagnosis not present

## 2022-11-28 DIAGNOSIS — M546 Pain in thoracic spine: Secondary | ICD-10-CM | POA: Insufficient documentation

## 2022-11-28 DIAGNOSIS — M7918 Myalgia, other site: Secondary | ICD-10-CM | POA: Diagnosis not present

## 2022-11-28 DIAGNOSIS — M7061 Trochanteric bursitis, right hip: Secondary | ICD-10-CM | POA: Insufficient documentation

## 2022-11-28 MED ORDER — MORPHINE SULFATE ER 30 MG PO TBCR: 30.0000 mg | EXTENDED_RELEASE_TABLET | Freq: Two times a day (BID) | ORAL | 0 refills | Status: DC

## 2022-11-28 MED ORDER — MORPHINE SULFATE ER 60 MG PO TBCR: 60.0000 mg | EXTENDED_RELEASE_TABLET | Freq: Two times a day (BID) | ORAL | 0 refills | Status: DC

## 2022-11-28 MED ORDER — MORPHINE SULFATE 15 MG PO TABS: 15.0000 mg | ORAL_TABLET | Freq: Four times a day (QID) | ORAL | 0 refills | Status: DC | PRN

## 2022-11-28 NOTE — Progress Notes (Signed)
Subjective:    Patient ID: [name], female    DOB: 1961-01-27, 62 y.o.   MRN: [id number]  HPI: [name] is a 61 y.o. female who returns for follow up appointment for chronic pain and medication refill. She states her pain is located in her mid- lower back, bilateral hips and bilateral knees R>L. She also reports she has a migraine ( frontal) it began on yesterday and she reports she took her medication. She rates her  pain 8. Her current exercise regime is walking and performing stretching exercises.  [name] Morphine equivalent is 240.00 MME.   Last UDS was Performed on 08/09/2022, it was consistent.     Pain Inventory Average Pain 9 Pain Right Now 8 My pain is constant, sharp, burning, dull, stabbing, tingling, and aching  In the last 24 hours, has pain interfered with the following? General activity 10 Relation with others 10 Enjoyment of life 10 What TIME of day is your pain at its worst? morning , daytime, evening, and night Sleep (in general) Poor  Pain is worse with: walking, bending, standing, and some activites Pain improves with: rest, heat/ice, therapy/exercise, medication, TENS, and injections Relief from Meds: 6  Family History  Problem Relation Age of Onset   Depression Mother    Hypertension Mother    Anxiety disorder Mother    Obesity Mother    Varicose Veins Mother    Hypertension Father    Heart failure Father    Diabetes Father    Heart attack Father    Arthritis Father    Cancer Father    Obesity Father    Colon cancer Neg Hx    Esophageal cancer Neg Hx    Stomach cancer Neg Hx    Rectal cancer Neg Hx    Social History   Socioeconomic History   Marital status: Married    Spouse name: [name]   Number of children: 2   Years of education: some college   Highest education level: Some college, no degree  Occupational History   Occupation: On disability    Employer: UNEMPLOYED  Tobacco Use   Smoking status: Former     Current packs/day: 0.00    Types: Cigarettes    Quit date: 02/05/1973    Years since quitting: 49.8   Smokeless tobacco: Never  Vaping Use   Vaping status: Never Used  Substance and Sexual Activity   Alcohol use: No   Drug use: No   Sexual activity: Not Currently    Partners: Male    Birth control/protection: None  Other Topics Concern   Not on file  Social History Narrative   On disability. Lives with her husband. She has one adopted daughter. She likes to read in her free time. She does chair exercises for 30-45 mins everyday.   Social Determinants of Health   Financial Resource Strain: Low Risk  (06/30/2022)   Overall Financial Resource Strain (CARDIA)    Difficulty of Paying Living Expenses: Not hard at all  Food Insecurity: No Food Insecurity (06/30/2022)   Hunger Vital Sign    Worried About Running Out of Food in the Last Year: Never true    Ran Out of Food in the Last Year: Never true  Transportation Needs: No Transportation Needs (06/30/2022)   PRAPARE - Administrator, Civil Service (Medical): No    Lack of Transportation (Non-Medical): No  Physical Activity: Inactive (06/30/2022)   Exercise Vital Sign    Days  of Exercise per Week: 0 days    Minutes of Exercise per Session: 0 min  Stress: Stress Concern Present (06/30/2022)   [employer] of Occupational Health - Occupational Stress Questionnaire    Feeling of Stress : Very much  Social Connections: Unknown (06/30/2022)   Social Connection and Isolation Panel [NHANES]    Frequency of Communication with Friends and Family: Patient declined    Frequency of Social Gatherings with Friends and Family: Patient declined    Attends Religious Services: Never    Database administrator or Organizations: No    Attends Engineer, structural: Never    Marital Status: Married   Past Surgical History:  Procedure Laterality Date   ABDOMINAL HYSTERECTOMY  06/2002   Basel Cell Carcinoma  06/2005, 07/2005    nasal tip and reconstruction   BREAST CYST ASPIRATION     CARPAL TUNNEL RELEASE  07/1993   both hands   COLONOSCOPY     COSMETIC SURGERY  Basal cell carcinoma of nose   HEMORROIDECTOMY  08/2001   JOINT REPLACEMENT  Left knee replacement   KNEE ARTHROPLASTY  09/2001, 05/2003   left knee, chondromalasia   KNEE ARTHROSCOPY  02/2003   Right knee, tisse release, chrondromalacia   REPLACEMENT TOTAL KNEE  12/2003   Left    THYROID LOBECTOMY  01/2005   malignant area removed from right lobe   THYROID SURGERY     thyroid lobe removal   TONSILLECTOMY  09/1972   TUBAL LIGATION  10/1997   Ulnar nerve entrapment  03/1993   left elbow   Past Surgical History:  Procedure Laterality Date   ABDOMINAL HYSTERECTOMY  06/2002   Basel Cell Carcinoma  06/2005, 07/2005   nasal tip and reconstruction   BREAST CYST ASPIRATION     CARPAL TUNNEL RELEASE  07/1993   both hands   COLONOSCOPY     COSMETIC SURGERY  Basal cell carcinoma of nose   HEMORROIDECTOMY  08/2001   JOINT REPLACEMENT  Left knee replacement   KNEE ARTHROPLASTY  09/2001, 05/2003   left knee, chondromalasia   KNEE ARTHROSCOPY  02/2003   Right knee, tisse release, chrondromalacia   REPLACEMENT TOTAL KNEE  12/2003   Left    THYROID LOBECTOMY  01/2005   malignant area removed from right lobe   THYROID SURGERY     thyroid lobe removal   TONSILLECTOMY  09/1972   TUBAL LIGATION  10/1997   Ulnar nerve entrapment  03/1993   left elbow   Past Medical History:  Diagnosis Date   Allergy    Anxiety    Cancer (HCC)    cancerous nodules on thyroid   Cancer (HCC)    basal cell Cancer-nose   Chronic pain syndrome    COPD (chronic obstructive pulmonary disease) (HCC)    Degeneration of lumbar or lumbosacral intervertebral disc    Depression    Facet syndrome, lumbar    GERD (gastroesophageal reflux disease)    Hyperlipidemia    Intervertebral lumbar disc disorder with myelopathy, lumbar region    Lumbosacral spondylosis without  myelopathy    Migraine without aura, with intractable migraine, so stated, without mention of status migrainosus    Primary localized osteoarthrosis, lower leg    Thyroid disease    hypothyroid   Unspecified musculoskeletal disorders and symptoms referable to neck    cervical/trapezius   There were no vitals taken for this visit.  Opioid Risk Score:   Fall Risk Score:  `1  Depression  screen PHQ 2/9     09/27/2022    8:25 AM 08/09/2022    8:44 AM 06/14/2022    9:23 AM 05/09/2022    8:21 AM 04/07/2022    8:52 AM 03/10/2022    8:13 AM 01/19/2022    2:12 PM  Depression screen PHQ 2/9  Decreased Interest 1 3 3 1 1 1 3   Down, Depressed, Hopeless 1 3 3 1 1 1 3   PHQ - 2 Score 2 6 6 2 2 2 6   Altered sleeping       3  Tired, decreased energy       3  Change in appetite       1  Feeling bad or failure about yourself        1  Trouble concentrating       1  Moving slowly or fidgety/restless       1  Suicidal thoughts       0  PHQ-9 Score       16  Difficult doing work/chores       Extremely dIfficult    Review of Systems  Musculoskeletal:  Positive for back pain, gait problem and neck pain.       Pain in the right knee, right hip pain, pain in both elbows  Neurological:  Positive for seizures.  All other systems reviewed and are negative.     Objective:   Physical Exam Vitals and nursing note reviewed.  Constitutional:      Appearance: Normal appearance.  Cardiovascular:     Rate and Rhythm: Normal rate and regular rhythm.     Pulses: Normal pulses.     Heart sounds: Normal heart sounds.  Pulmonary:     Effort: Pulmonary effort is normal.     Breath sounds: Normal breath sounds.  Musculoskeletal:     Cervical back: Normal range of motion and neck supple.     Comments: Normal Muscle Bulk and Muscle Testing Reveals:  Upper Extremities: Full ROM and Muscle Strength 5/5  Thoracic Paraspinal Tenderness: T-4-T-7 Lumbar Paraspinal Tenderness: L-4-L-5 Bilateral Greater Trochanter  Tenderness Lower Extremities: Decreased ROM and Muscle Strength 5/5 Bilateral Lower Extremity Flexion Produces Pain into his Bilateral Patella's  Arises from Table slowly using cane for support Antalgic  Gait     Skin:    General: Skin is warm and dry.  Neurological:     Mental Status: She is alert and oriented to person, place, and time.  Psychiatric:        Mood and Affect: Mood normal.        Behavior: Behavior normal.         Assessment & Plan:  1.Chronic Bilateral Thoracic Back Pain:  Continue HEP as Tolerated. Continue to Monitor. 11/28/2022 2.  Low Back Pain/ Lumbar Facet Arthropathy: Refilled: MSIR 15 mg one tablet every 6 hours as needed #100 and   Refilled: MS Contin 60 mg and MS Contin  30 mg Q 12 hours to equal   90 mg  #60/60.Marland Kitchen Second script sent to pharmacy to accommodate to scheduled appointment.  Continue Gabapentin and Pamelor. 11/28/2022 We will continue the opioid monitoring program, this consists of regular clinic visits, examinations, urine drug screen, pill counts as well as use of [place] Controlled Substance Reporting system. A 12 month History has been reviewed on the [place] Controlled Substance Reporting System on 11/28/2022. 3. Degenerative Disk Disease:Encouraged Continue HEP as Tolertated  and heat therapy. Continue Current Medication Regime.11/28/2022 4. Bilateral  Greater Trochanteric Bursitis:  Continue current treatment regimen with Heat and Ice Therapy. 11/28/2022 5. Osteoarthritis of Bilateral Knees: R>L Continue current treatment modality with home exercise program and heat therapy. 11/28/2022 6. Migraine Headaches: Continue current medication regimen  Continue with  Migraine Journal. Aimovig discontinued due to Hives. Continue Lamictal and Relpax. Continue to Monitor.  11/28/2022 7. Smoking Cessation: She has quit smoking since 08/25/2016. Continue to Monitor. 11/28/2022 8.Constipation: Continue  Current medication regime with Linzess.  11/28/2022 9. Muscle Spasm: Continue current medication regime with Tizanidine.11/28/2022 10. DepressionAnxiety/ Panic Attacks: Continue current medication regimen and treatment modality.  Celexa.Lamictal  and Counseling with Coolidge Breeze and  Dr. Gilmore Laroche Psychiatrist. 11/28/2022 11. RSD: Continue with current medication Regime with Gabapentin. 11/28/2022   F/U in 1 months

## 2022-12-04 ENCOUNTER — Ambulatory Visit (INDEPENDENT_AMBULATORY_CARE_PROVIDER_SITE_OTHER): Payer: Medicare Other | Admitting: Licensed Clinical Social Worker

## 2022-12-04 DIAGNOSIS — G894 Chronic pain syndrome: Secondary | ICD-10-CM

## 2022-12-04 DIAGNOSIS — F411 Generalized anxiety disorder: Secondary | ICD-10-CM | POA: Diagnosis not present

## 2022-12-04 DIAGNOSIS — F331 Major depressive disorder, recurrent, moderate: Secondary | ICD-10-CM | POA: Diagnosis not present

## 2022-12-04 NOTE — Progress Notes (Signed)
Virtual Visit via Video Note  I connected with [name] on 12/04/22 at  8:00 AM EDT by a video enabled telemedicine application and verified that I am speaking with the correct person using two identifiers.  Location: Patient: home Provider: office   I discussed the limitations of evaluation and management by telemedicine and the availability of in person appointments. The patient expressed understanding and agreed to proceed.   I discussed the assessment and treatment plan with the patient. The patient was provided an opportunity to ask questions and all were answered. The patient agreed with the plan and demonstrated an understanding of the instructions.   The patient was advised to call back or seek an in-person evaluation if the symptoms worsen or if the condition fails to improve as anticipated.  I provided 40 minutes of non-face-to-face time during this encounter.  THERAPIST PROGRESS NOTE  Session Time: 8:00 AM to 8:40 AM  Participation Level: Active  Behavioral Response: CasualAlertsubdued  Type of Therapy: Individual Therapy  Treatment Goals addressed:  Patient shares mood improves through interaction and therapy, this happens through positive topics, through working through stressors  ProgressTowards Goals: Progressing-reviewed treatment goals therapy continues to be helpful therapist processed thoughts and feelings in session to help with coping finding supportive and strength-based intervention  Interventions: Solution Focused, Strength-based, Supportive, and Other: coping  Summary: [name] is a 62 y.o. female who presents with says doing ok giving a hand gesture of so-so sign. Therapist noted day to day can get old being at the house a lot and patient said yes. Would go more places if not so much effort. Hasn't seen primary care doctor for three years make appointments and then cancel it takes so much effort to get ready. Walking any distance  knows going to hurt. Put it off and procrastinate. Two days to get ready to go to doctor's. Washes her hair, take a bath wash clothes do as much as can the day before so don't have as much to do the day of. If do in one day time ready ringing from sweat, face hot exhausted just to get ready. That one has to go to. Thyroid scanned hasn't done, mammogram, colonoscopy. Immunizations don't get flu shot because will get flu.  Therapist could relate to this being behind and doctors appointments Therapist shared to think she is good about dying her hair and vomiting patient says doesn't have to dye hair some grey. Talked about [name] sleeping beside her plays will jump on her and wants to play in the middle of the night wake her up. Showed therapist his toys. They all have their favorites [name] likes the squeakers and likes to chew on things. Raw hide bones shows therapist showed all chewed up.  Talked about politics concerns about the upcoming election.  What makes her feel good mostly [name].  Talked about Mom a lot of psychological problems had a nervous breakdown at 9-10. She was addicted to prescription drugs.  At that point session got cut off therapist attempted to call house number again was cut off hung up attempted to call mobile number Bronson Curb, her husband, is at  [employer] office and will check with [name]  Therapist reviewed treatment plan patient gave consent to complete virtually checked in with patient relates she is doing so-so assess helpful to have an outlet to talk as we explored being at home, not being able to get out has an impact.  Even getting to the doctors appointment is  a challenge.  Behind on appointments.  Talked about various subjects such as her support animal shared some things about him showing therapist's choice talked about current events politics, shared memories of the past both therapist and patient when younger struggles but also some good memories.  Therapist used empathetic  listening discussed current and underlying problems particularly mood helpful for patient have connections through relationships such as therapy.  Therapist provided space and support for patient to talk about thoughts and feelings in session.  Suicidal/Homicidal: No  Plan: Return again in 2 weeks.2.Work on Pharmacologist for stressors process thoughts and feelings in session  Diagnosis: major depressive disorder, recurrent, moderate, degenerative disc disease, chronic pain syndrome, generalized anxiety disorder  Collaboration of Care: Other none needed  Patient/Guardian was advised Release of Information must be obtained prior to any record release in order to collaborate their care with an outside provider. Patient/Guardian was advised if they have not already done so to contact the registration department to sign all necessary forms in order for [place] to release information regarding their care.   Consent: Patient/Guardian gives verbal consent for treatment and assignment of benefits for services provided during this visit. Patient/Guardian expressed understanding and agreed to proceed.   Coolidge Breeze, LCSW 12/04/2022

## 2022-12-18 ENCOUNTER — Ambulatory Visit (INDEPENDENT_AMBULATORY_CARE_PROVIDER_SITE_OTHER): Payer: Medicare Other | Admitting: Licensed Clinical Social Worker

## 2022-12-18 DIAGNOSIS — F331 Major depressive disorder, recurrent, moderate: Secondary | ICD-10-CM

## 2022-12-18 DIAGNOSIS — F411 Generalized anxiety disorder: Secondary | ICD-10-CM | POA: Diagnosis not present

## 2022-12-18 DIAGNOSIS — G894 Chronic pain syndrome: Secondary | ICD-10-CM
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
## 2022-12-26 ENCOUNTER — Encounter: Payer: Medicare Other | Attending: Physical Medicine and Rehabilitation | Admitting: Registered Nurse

## 2022-12-26 ENCOUNTER — Other Ambulatory Visit: Payer: Self-pay | Admitting: Physician Assistant

## 2022-12-26 ENCOUNTER — Encounter: Payer: Self-pay | Admitting: Registered Nurse

## 2022-12-26 ENCOUNTER — Encounter: Payer: Self-pay | Admitting: Physician Assistant

## 2022-12-26 ENCOUNTER — Ambulatory Visit (INDEPENDENT_AMBULATORY_CARE_PROVIDER_SITE_OTHER): Payer: Medicare Other | Admitting: Physician Assistant

## 2022-12-26 VITALS — BP 111/74 | HR 86 | Ht 67.0 in | Wt 257.0 lb

## 2022-12-26 VITALS — BP 134/79 | HR 90 | Temp 98.8°F | Wt 257.0 lb

## 2022-12-26 DIAGNOSIS — M546 Pain in thoracic spine: Secondary | ICD-10-CM | POA: Diagnosis not present

## 2022-12-26 DIAGNOSIS — I83893 Varicose veins of bilateral lower extremities with other complications: Secondary | ICD-10-CM | POA: Insufficient documentation

## 2022-12-26 DIAGNOSIS — Z5181 Encounter for therapeutic drug level monitoring: Secondary | ICD-10-CM | POA: Diagnosis not present

## 2022-12-26 DIAGNOSIS — G8929 Other chronic pain: Secondary | ICD-10-CM | POA: Diagnosis not present

## 2022-12-26 DIAGNOSIS — R609 Edema, unspecified: Secondary | ICD-10-CM | POA: Diagnosis not present

## 2022-12-26 DIAGNOSIS — R7309 Other abnormal glucose: Secondary | ICD-10-CM | POA: Insufficient documentation

## 2022-12-26 DIAGNOSIS — G894 Chronic pain syndrome: Secondary | ICD-10-CM | POA: Insufficient documentation

## 2022-12-26 DIAGNOSIS — L819 Disorder of pigmentation, unspecified: Secondary | ICD-10-CM | POA: Diagnosis not present

## 2022-12-26 DIAGNOSIS — E1165 Type 2 diabetes mellitus with hyperglycemia: Secondary | ICD-10-CM | POA: Diagnosis not present

## 2022-12-26 DIAGNOSIS — M654 Radial styloid tenosynovitis [de Quervain]: Secondary | ICD-10-CM | POA: Diagnosis not present

## 2022-12-26 DIAGNOSIS — M2550 Pain in unspecified joint: Secondary | ICD-10-CM | POA: Diagnosis not present

## 2022-12-26 DIAGNOSIS — M7062 Trochanteric bursitis, left hip: Secondary | ICD-10-CM | POA: Diagnosis not present

## 2022-12-26 DIAGNOSIS — Z79891 Long term (current) use of opiate analgesic: Secondary | ICD-10-CM | POA: Insufficient documentation

## 2022-12-26 DIAGNOSIS — M7918 Myalgia, other site: Secondary | ICD-10-CM | POA: Diagnosis not present

## 2022-12-26 DIAGNOSIS — M1711 Unilateral primary osteoarthritis, right knee: Secondary | ICD-10-CM | POA: Insufficient documentation

## 2022-12-26 DIAGNOSIS — M1712 Unilateral primary osteoarthritis, left knee: Secondary | ICD-10-CM | POA: Insufficient documentation

## 2022-12-26 DIAGNOSIS — M7061 Trochanteric bursitis, right hip: Secondary | ICD-10-CM | POA: Diagnosis not present

## 2022-12-26 DIAGNOSIS — M47816 Spondylosis without myelopathy or radiculopathy, lumbar region: Secondary | ICD-10-CM | POA: Insufficient documentation

## 2022-12-26 LAB — POCT GLYCOSYLATED HEMOGLOBIN (HGB A1C): Hemoglobin A1C: 8.1 % — AB (ref 4.0–5.6)

## 2022-12-26 MED ORDER — MORPHINE SULFATE 15 MG PO TABS: 15.0000 mg | ORAL_TABLET | Freq: Four times a day (QID) | ORAL | 0 refills | Status: DC | PRN

## 2022-12-26 MED ORDER — MORPHINE SULFATE ER 60 MG PO TBCR: 60.0000 mg | EXTENDED_RELEASE_TABLET | Freq: Two times a day (BID) | ORAL | 0 refills | Status: DC

## 2022-12-26 MED ORDER — HYDROCHLOROTHIAZIDE 12.5 MG PO TABS: ORAL_TABLET | ORAL | 0 refills | Status: DC

## 2022-12-26 MED ORDER — DAPAGLIFLOZIN PRO-METFORMIN ER 10-1000 MG PO TB24: 1.0000 | ORAL_TABLET | Freq: Every day | ORAL | 2 refills | Status: DC

## 2022-12-26 MED ORDER — MORPHINE SULFATE ER 30 MG PO TBCR: 30.0000 mg | EXTENDED_RELEASE_TABLET | Freq: Two times a day (BID) | ORAL | 0 refills | Status: DC

## 2022-12-26 NOTE — Patient Instructions (Addendum)
Purchase Thumb Spica Splint :  Send a My Char Message in two weeks with update

## 2022-12-26 NOTE — Progress Notes (Deleted)
   Acute Office Visit  Subjective:     Patient ID: [name], female    DOB: 1960/03/27, 62 y.o.   MRN: [id number]  No chief complaint on file.   HPI Patient is in today for ***  Smoker diabetiec   Pain doc for purple legs  ROS      Objective:    There were no vitals taken for this visit. {Vitals History (Optional):23777}  Physical Exam  No results found for any visits on 12/26/22.      Assessment & Plan:   Problem List Items Addressed This Visit   None   No orders of the defined types were placed in this encounter.   No follow-ups on file.  Tandy Gaw, PA-C

## 2022-12-26 NOTE — Progress Notes (Signed)
Subjective:    Patient ID: [name], female    DOB: 10-27-1960, 62 y.o.   MRN: [id number]  HPI: [name] is a 62 y.o. female who returns for follow up appointment for chronic pain and medication refill. She states her pain is located in her right thumb, especially with repetitive  movements, mid- lower back, bilateral hips and bilateral knee pain. She rates her pain 8. Her current exercise regime is walking and performing stretching exercises.  [name] arrived to office with purplish discoloration to her lower extremities, with pitting edema, she denies any pain in her lower extremities or calves. She reports she will call her PCP today and scheduled an appointment with her PCP. [name] also states her granddaughter noticed her lower extremities were purplish when she came for a visit last week, she didn't call her PCP regarding the above, she denies any pain in her lower extremities or calves. . [name] was instructed to follow up with her PCP, she verbalizes understanding.    [name] Morphine equivalent is 240.00  MME.   UDS ordered today.     Pain Inventory Average Pain 9 Pain Right Now 8 My pain is constant, sharp, burning, dull, stabbing, tingling, and aching  In the last 24 hours, has pain interfered with the following? General activity 10 Relation with others 10 Enjoyment of life 10 What TIME of day is your pain at its worst? varies Sleep (in general) Poor  Pain is worse with: walking, bending, standing, and some activites Pain improves with: rest, heat/ice, therapy/exercise, medication, TENS, and injections Relief from Meds: 6  Family History  Problem Relation Age of Onset   Depression Mother    Hypertension Mother    Anxiety disorder Mother    Obesity Mother    Varicose Veins Mother    Hypertension Father    Heart failure Father    Diabetes Father    Heart attack Father    Arthritis Father    Cancer Father    Obesity  Father    Colon cancer Neg Hx    Esophageal cancer Neg Hx    Stomach cancer Neg Hx    Rectal cancer Neg Hx    Social History   Socioeconomic History   Marital status: Married    Spouse name: Sullivan Lone   Number of children: 2   Years of education: some college   Highest education level: Some college, no degree  Occupational History   Occupation: On disability    Employer: UNEMPLOYED  Tobacco Use   Smoking status: Former    Current packs/day: 0.00    Types: Cigarettes    Quit date: 02/05/1973    Years since quitting: 49.9   Smokeless tobacco: Never  Vaping Use   Vaping status: Never Used  Substance and Sexual Activity   Alcohol use: No   Drug use: No   Sexual activity: Not Currently    Partners: Male    Birth control/protection: None  Other Topics Concern   Not on file  Social History Narrative   On disability. Lives with her husband. She has one adopted daughter. She likes to read in her free time. She does chair exercises for 30-45 mins everyday.   Social Determinants of Health   Financial Resource Strain: Low Risk  (06/30/2022)   Overall Financial Resource Strain (CARDIA)    Difficulty of Paying Living Expenses: Not hard at all  Food Insecurity: No Food Insecurity (06/30/2022)   Hunger Vital  Sign    Worried About Programme researcher, broadcasting/film/video in the Last Year: Never true    Ran Out of Food in the Last Year: Never true  Transportation Needs: No Transportation Needs (06/30/2022)   PRAPARE - Administrator, Civil Service (Medical): No    Lack of Transportation (Non-Medical): No  Physical Activity: Inactive (06/30/2022)   Exercise Vital Sign    Days of Exercise per Week: 0 days    Minutes of Exercise per Session: 0 min  Stress: Stress Concern Present (06/30/2022)   [employer] of Occupational Health - Occupational Stress Questionnaire    Feeling of Stress : Very much  Social Connections: Unknown (06/30/2022)   Social Connection and Isolation Panel [NHANES]     Frequency of Communication with Friends and Family: Patient declined    Frequency of Social Gatherings with Friends and Family: Patient declined    Attends Religious Services: Never    Database administrator or Organizations: No    Attends Engineer, structural: Never    Marital Status: Married   Past Surgical History:  Procedure Laterality Date   ABDOMINAL HYSTERECTOMY  06/2002   Basel Cell Carcinoma  06/2005, 07/2005   nasal tip and reconstruction   BREAST CYST ASPIRATION     CARPAL TUNNEL RELEASE  07/1993   both hands   COLONOSCOPY     COSMETIC SURGERY  Basal cell carcinoma of nose   HEMORROIDECTOMY  08/2001   JOINT REPLACEMENT  Left knee replacement   KNEE ARTHROPLASTY  09/2001, 05/2003   left knee, chondromalasia   KNEE ARTHROSCOPY  02/2003   Right knee, tisse release, chrondromalacia   REPLACEMENT TOTAL KNEE  12/2003   Left    THYROID LOBECTOMY  01/2005   malignant area removed from right lobe   THYROID SURGERY     thyroid lobe removal   TONSILLECTOMY  09/1972   TUBAL LIGATION  10/1997   Ulnar nerve entrapment  03/1993   left elbow   Past Surgical History:  Procedure Laterality Date   ABDOMINAL HYSTERECTOMY  06/2002   Basel Cell Carcinoma  06/2005, 07/2005   nasal tip and reconstruction   BREAST CYST ASPIRATION     CARPAL TUNNEL RELEASE  07/1993   both hands   COLONOSCOPY     COSMETIC SURGERY  Basal cell carcinoma of nose   HEMORROIDECTOMY  08/2001   JOINT REPLACEMENT  Left knee replacement   KNEE ARTHROPLASTY  09/2001, 05/2003   left knee, chondromalasia   KNEE ARTHROSCOPY  02/2003   Right knee, tisse release, chrondromalacia   REPLACEMENT TOTAL KNEE  12/2003   Left    THYROID LOBECTOMY  01/2005   malignant area removed from right lobe   THYROID SURGERY     thyroid lobe removal   TONSILLECTOMY  09/1972   TUBAL LIGATION  10/1997   Ulnar nerve entrapment  03/1993   left elbow   Past Medical History:  Diagnosis Date   Allergy    Anxiety    Cancer  (HCC)    cancerous nodules on thyroid   Cancer (HCC)    basal cell Cancer-nose   Chronic pain syndrome    COPD (chronic obstructive pulmonary disease) (HCC)    Degeneration of lumbar or lumbosacral intervertebral disc    Depression    Facet syndrome, lumbar    GERD (gastroesophageal reflux disease)    Hyperlipidemia    Intervertebral lumbar disc disorder with myelopathy, lumbar region    Lumbosacral spondylosis without  myelopathy    Migraine without aura, with intractable migraine, so stated, without mention of status migrainosus    Primary localized osteoarthrosis, lower leg    Thyroid disease    hypothyroid   Unspecified musculoskeletal disorders and symptoms referable to neck    cervical/trapezius   BP 111/74   Pulse 86   Ht 5\' 7"  (1.702 m)   Wt 257 lb (116.6 kg)   SpO2 92%   BMI 40.25 kg/m   Opioid Risk Score:   Fall Risk Score:  `1  Depression screen [facility] 2/9     11/28/2022    8:24 AM 09/27/2022    8:25 AM 08/09/2022    8:44 AM 06/14/2022    9:23 AM 05/09/2022    8:21 AM 04/07/2022    8:52 AM 03/10/2022    8:13 AM  Depression screen PHQ 2/9  Decreased Interest 1 1 3 3 1 1 1   Down, Depressed, Hopeless 1 1 3 3 1 1 1   PHQ - 2 Score 2 2 6 6 2 2 2      Review of Systems  Musculoskeletal:  Positive for back pain.       Right leg pain  All other systems reviewed and are negative.     Objective:   Physical Exam Vitals and nursing note reviewed.  Constitutional:      Appearance: Normal appearance. She is obese.  Cardiovascular:     Rate and Rhythm: Normal rate and regular rhythm.     Pulses: Normal pulses.     Heart sounds: Normal heart sounds.  Pulmonary:     Effort: Pulmonary effort is normal.     Breath sounds: Normal breath sounds.  Musculoskeletal:     Right lower leg: Edema present.     Left lower leg: Edema present.     Comments: Normal Muscle Bulk and Muscle Testing Reveals:  Upper Extremities: Full ROM and Muscle Strength on Right 4/5 and Left   5/5 Thoracic Paraspinal Tenderness: T-4-T-6 Mainly Left Side  Lumbar Paraspinal Tenderness: L-3-L-5 Lower Extremities: Decreased ROM and Muscle Strength 5/5 Arises from Table Slowly using cane fpr support Antalgic Gait     Skin:    General: Skin is warm and dry.     Comments: Purplish skin discoloration of lower extremities.   Neurological:     Mental Status: She is alert and oriented to person, place, and time.  Psychiatric:        Mood and Affect: Mood normal.        Behavior: Behavior normal.         Assessment & Plan:  1.Chronic Bilateral Thoracic Back Pain:  Continue HEP as Tolerated. Continue to Monitor. 12/26/2022 2.  Low Back Pain/ Lumbar Facet Arthropathy: Refilled: MSIR 15 mg one tablet every 6 hours as needed #100 and   Refilled: MS Contin 60 mg and MS Contin  30 mg Q 12 hours to equal   90 mg  #60/60.Marland Kitchen Second script sent to pharmacy to accommodate to scheduled appointment.  Continue Gabapentin and Pamelor. 12/26/2022 We will continue the opioid monitoring program, this consists of regular clinic visits, examinations, urine drug screen, pill counts as well as use of [place] Controlled Substance Reporting system. A 12 month History has been reviewed on the [place] Controlled Substance Reporting System on 12/26/2022. 3. Degenerative Disk Disease:Encouraged Continue HEP as Tolertated  and heat therapy. Continue Current Medication Regime.12/26/2022 4. Bilateral Greater Trochanteric Bursitis:  Continue current treatment regimen with Heat and Ice Therapy. 12/26/2022  5. Osteoarthritis of Bilateral Knees: R>L Continue current treatment modality with home exercise program and heat therapy. 12/26/2022 6. Migraine Headaches: Continue current medication regimen  Continue with  Migraine Journal. Aimovig discontinued due to Hives. Continue Lamictal and Relpax. Continue to Monitor.  12/26/2022 7. Smoking Cessation: She has quit smoking since 08/25/2016. Continue to Monitor.  12/26/2022 8.Constipation: Continue  Current medication regime with Linzess. 12/26/2022 9. Muscle Spasm: Continue current medication regime with Tizanidine.12/26/2022 10. DepressionAnxiety/ Panic Attacks: Continue current medication regimen and treatment modality.  Celexa.Lamictal  and Counseling with Coolidge Breeze and  Dr. Gilmore Laroche Psychiatrist. 11/192024 11. RSD: Continue with current medication Regime with Gabapentin. 12/26/2022  12. 2+ Pitting Edema/ Purplish Discoloration to Lower Extremities: Ms. Organ denies any pain in her lower extremities or calves. She states she will call her PCP today  and scheduled an appointment with her PCP.  13. DeQuervain's Tenosynovitis: Ms. Iyengar will purchased a Thumb Spica Splint and avoid repetitive thumb movements as much as possible.  She will send a My-Chart message or call office in two weeks with update on the above, she verbalizes understanding.  F/U in 1 months

## 2022-12-26 NOTE — Progress Notes (Signed)
Acute Office Visit  Subjective:     Patient ID: [name], female    DOB: 01-22-1961, 62 y.o.   MRN: [id number]  No chief complaint on file.   HPI Patient is in today for lower leg swelling and purple discoloration that her daughter noticed Friday, 12/22/22. She says her legs feel tight. She saw pain management this morning and they told her it could be a blood clot and advised her to see her PCP. PMH of elevated A1c on 08/26/21 of 6.8. She smokes cigarettes. Patient denies pain, fever, SOB, neuropathy, and recent travel or surgery.   She also complains of toenail fungus for which she has tried [employer] which does not work.  .. Active Ambulatory Problems    Diagnosis Date Noted   Malignant basal cell neoplasm of skin 04/05/2011   History of tobacco abuse 04/05/2011   RSD (reflex sympathetic dystrophy) 04/05/2011   Intractable migraine with aura 04/05/2011   History of thyroid cancer 04/05/2011   Hyperlipidemia 04/06/2011   Depression 04/20/2011   Lipoma of colon 04/26/2011   Lumbar facet arthropathy 05/02/2011   History of arthroplasty of left knee 05/02/2011   Degenerative disc disease, lumbar 05/02/2011   Myofascial muscle pain 01/24/2012   Complex regional pain syndrome of left lower extremity 08/02/2012   Degenerative arthritis of right knee 03/26/2013   Diabetes type 2, controlled (HCC) 06/02/2015   Hypothyroid 10/11/2017   Hair loss 07/25/2019   Onychomycosis 07/25/2019   OAB (overactive bladder) 07/25/2019   Lipoma 07/25/2019   Elevated hemoglobin A1c 12/26/2022   Varicose veins of leg with swelling, bilateral 12/26/2022   Discoloration of skin of lower leg 12/26/2022   Resolved Ambulatory Problems    Diagnosis Date Noted   Special screening for malignant neoplasms, colon 04/26/2011   Past Medical History:  Diagnosis Date   Allergy    Anxiety    Cancer (HCC)    Cancer (HCC)    Chronic pain syndrome    COPD (chronic obstructive pulmonary  disease) (HCC)    Degeneration of lumbar or lumbosacral intervertebral disc    Facet syndrome, lumbar    GERD (gastroesophageal reflux disease)    Intervertebral lumbar disc disorder with myelopathy, lumbar region    Lumbosacral spondylosis without myelopathy    Migraine without aura, with intractable migraine, so stated, without mention of status migrainosus    Primary localized osteoarthrosis, lower leg    Thyroid disease    Unspecified musculoskeletal disorders and symptoms referable to neck     Review of Systems  Constitutional:  Negative for fever.  Respiratory:  Negative for shortness of breath.   Cardiovascular:  Negative for chest pain.  Musculoskeletal:  Negative for myalgias.  Neurological:  Negative for tingling and sensory change.        Objective:    BP 134/79   Pulse 90   Temp 98.8 F (37.1 C) (Oral)   Wt 257 lb (116.6 kg)   SpO2 95%   BMI 40.25 kg/m  BP Readings from Last 3 Encounters:  12/26/22 134/79  12/26/22 111/74  11/28/22 108/68   Wt Readings from Last 3 Encounters:  12/26/22 257 lb (116.6 kg)  12/26/22 257 lb (116.6 kg)  11/28/22 254 lb (115.2 kg)      Physical Exam Cardiovascular:     Rate and Rhythm: Normal rate and regular rhythm.     Pulses: Normal pulses.     Heart sounds: Normal heart sounds.  Pulmonary:     Effort:  Pulmonary effort is normal.     Breath sounds: Normal breath sounds.  Skin:    Comments: Purple discoloration bilaterally, worse on L side, with some evidence of superficial varicosities. Small areas of hypopigmentation. Not warm or cold to touch. No TTP. No erythema.    .. Diabetic foot exam was performed with the following findings:   Normal sensation of 10g monofilament Intact posterior tibialis and dorsalis pedis pulses Bilateral yellow, thickened toe nails. No lacerations or ulcerations present.      Results for orders placed or performed in visit on 12/26/22  POCT HgB A1C  Result Value Ref Range    Hemoglobin A1C 8.1 (A) 4.0 - 5.6 %   HbA1c POC (<> result, manual entry)     HbA1c, POC (prediabetic range)     HbA1c, POC (controlled diabetic range)          Assessment & Plan:  Marland KitchenMarland KitchenDiagnoses and all orders for this visit:  Varicose veins of leg with swelling, bilateral -     D-Dimer, Quantitative -     CMP14+EGFR -     TSH -     CBC w/Diff/Platelet -     hydrochlorothiazide (HYDRODIURIL) 12.5 MG tablet; Take one tablet as needed for lower leg swelling.  Elevated hemoglobin A1c  Discoloration of skin of lower leg -     D-Dimer, Quantitative -     CMP14+EGFR -     TSH -     CBC w/Diff/Platelet  Type 2 diabetes mellitus with hyperglycemia, without long-term current use of insulin (HCC) -     Dapagliflozin Pro-metFORMIN ER (XIGDUO XR) 11-998 MG TB24; Take 1 tablet by mouth daily. -     POCT HgB A1C   Pt is not having any pain in her legs and swelling is bilateral. She is not SOB. Her tachycardia resolved with rest.  I will order a D-dimer to increase the likelyhood of DVT and thus order doppler of bilateral legs.  If D-dimer is elevated, doppler. If D-dimer normal, compression stockings and diuretic PRN for leg swelling. Hydrochlorothiazide 12.5mg  as needed sent to pharmacy.   Discussed diagnosis of diabetes due to two A1c readings in diabetic range.  Discussed with patient risks of uncontrolled diabetes. Discussed dietary and exercise recommendations including decreasing carbohydrate intake and increasing protein intake and exercising 147min/wk.  Recommended water aerobics due to limited mobility. Started Xigduo for blood glucose management. See PCP in 1 month for diabetic management.   Goals discussed for diabetics: BP< 130/80 A1c < 7 FBG < 90-120 LDL < 70  Spent 50 minutes with patient reviewing chart, discussing diabetes and management, discussing bilateral leg swelling and work up.    Return in about 4 weeks (around 01/23/2023), or if symptoms worsen or fail to  improve, for with PCP.  Tandy Gaw, PA-C

## 2022-12-26 NOTE — Patient Instructions (Addendum)
If normal d-dimer then ok for compression.  Keep legs elevated.  Ok to start diuretic as needed for lower leg swelling.  Start xigduo for diabetes management Follow up with PCP in 1 month  Diabetes Mellitus and Nutrition, Adult When you have diabetes, or diabetes mellitus, it is very important to have healthy eating habits because your blood sugar (glucose) levels are greatly affected by what you eat and drink. Eating healthy foods in the right amounts, at about the same times every day, can help you: Manage your blood glucose. Lower your risk of heart disease. Improve your blood pressure. Reach or maintain a healthy weight. What can affect my meal plan? Every person with diabetes is different, and each person has different needs for a meal plan. Your health care provider may recommend that you work with a dietitian to make a meal plan that is best for you. Your meal plan may vary depending on factors such as: The calories you need. The medicines you take. Your weight. Your blood glucose, blood pressure, and cholesterol levels. Your activity level. Other health conditions you have, such as heart or kidney disease. How do carbohydrates affect me? Carbohydrates, also called carbs, affect your blood glucose level more than any other type of food. Eating carbs raises the amount of glucose in your blood. It is important to know how many carbs you can safely have in each meal. This is different for every person. Your dietitian can help you calculate how many carbs you should have at each meal and for each snack. How does alcohol affect me? Alcohol can cause a decrease in blood glucose (hypoglycemia), especially if you use insulin or take certain diabetes medicines by mouth. Hypoglycemia can be a life-threatening condition. Symptoms of hypoglycemia, such as sleepiness, dizziness, and confusion, are similar to symptoms of having too much alcohol. Do not drink alcohol if: Your health care provider  tells you not to drink. You are pregnant, may be pregnant, or are planning to become pregnant. If you drink alcohol: Limit how much you have to: 0-1 drink a day for women. 0-2 drinks a day for men. Know how much alcohol is in your drink. In the [place] one drink equals one 12 oz bottle of beer (355 mL), one 5 oz glass of wine (148 mL), or one 1 oz glass of hard liquor (44 mL). Keep yourself hydrated with water, diet soda, or unsweetened iced tea. Keep in mind that regular soda, juice, and other mixers may contain a lot of sugar and must be counted as carbs. What are tips for following this plan?  Reading food labels Start by checking the serving size on the Nutrition Facts label of packaged foods and drinks. The number of calories and the amount of carbs, fats, and other nutrients listed on the label are based on one serving of the item. Many items contain more than one serving per package. Check the total grams (g) of carbs in one serving. Check the number of grams of saturated fats and trans fats in one serving. Choose foods that have a low amount or none of these fats. Check the number of milligrams (mg) of salt (sodium) in one serving. Most people should limit total sodium intake to less than 2,300 mg per day. Always check the nutrition information of foods labeled as "low-fat" or "nonfat." These foods may be higher in added sugar or refined carbs and should be avoided. Talk to your dietitian to identify your daily goals for nutrients listed  on the label. Shopping Avoid buying canned, pre-made, or processed foods. These foods tend to be high in fat, sodium, and added sugar. Shop around the outside edge of the grocery store. This is where you will most often find fresh fruits and vegetables, bulk grains, fresh meats, and fresh dairy products. Cooking Use low-heat cooking methods, such as baking, instead of high-heat cooking methods, such as deep frying. Cook using healthy oils, such as olive,  canola, or sunflower oil. Avoid cooking with butter, cream, or high-fat meats. Meal planning Eat meals and snacks regularly, preferably at the same times every day. Avoid going long periods of time without eating. Eat foods that are high in fiber, such as fresh fruits, vegetables, beans, and whole grains. Eat 4-6 oz (112-168 g) of lean protein each day, such as lean meat, chicken, fish, eggs, or tofu. One ounce (oz) (28 g) of lean protein is equal to: 1 oz (28 g) of meat, chicken, or fish. 1 egg.  cup (62 g) of tofu. Eat some foods each day that contain healthy fats, such as avocado, nuts, seeds, and fish. What foods should I eat? Fruits Berries. Apples. Oranges. Peaches. Apricots. Plums. Grapes. Mangoes. Papayas. Pomegranates. Kiwi. Cherries. Vegetables Leafy greens, including lettuce, spinach, kale, chard, collard greens, mustard greens, and cabbage. Beets. Cauliflower. Broccoli. Carrots. Green beans. Tomatoes. Peppers. Onions. Cucumbers. Brussels sprouts. Grains Whole grains, such as whole-wheat or whole-grain bread, crackers, tortillas, cereal, and pasta. Unsweetened oatmeal. Quinoa. Brown or wild rice. Meats and other proteins Seafood. Poultry without skin. Lean cuts of poultry and beef. Tofu. Nuts. Seeds. Dairy Low-fat or fat-free dairy products such as milk, yogurt, and cheese. The items listed above may not be a complete list of foods and beverages you can eat and drink. Contact a dietitian for more information. What foods should I avoid? Fruits Fruits canned with syrup. Vegetables Canned vegetables. Frozen vegetables with butter or cream sauce. Grains Refined white flour and flour products such as bread, pasta, snack foods, and cereals. Avoid all processed foods. Meats and other proteins Fatty cuts of meat. Poultry with skin. Breaded or fried meats. Processed meat. Avoid saturated fats. Dairy Full-fat yogurt, cheese, or milk. Beverages Sweetened drinks, such as soda or  iced tea. The items listed above may not be a complete list of foods and beverages you should avoid. Contact a dietitian for more information. Questions to ask a health care provider Do I need to meet with a certified diabetes care and education specialist? Do I need to meet with a dietitian? What number can I call if I have questions? When are the best times to check my blood glucose? Where to find more information: American Diabetes Association: diabetes.org Academy of Nutrition and Dietetics: eatright.[employer] of Diabetes and Digestive and Kidney Diseases: StageSync.si Association of Diabetes Care & Education Specialists: diabeteseducator.org Summary It is important to have healthy eating habits because your blood sugar (glucose) levels are greatly affected by what you eat and drink. It is important to use alcohol carefully. A healthy meal plan will help you manage your blood glucose and lower your risk of heart disease. Your health care provider may recommend that you work with a dietitian to make a meal plan that is best for you. This information is not intended to replace advice given to you by your health care provider. Make sure you discuss any questions you have with your health care provider. Document Revised: 08/27/2019 Document Reviewed: 08/27/2019 Elsevier Patient Education  2024 [employer].

## 2022-12-27 LAB — CMP14+EGFR
ALT: 16 [IU]/L (ref 0–32)
AST: 19 [IU]/L (ref 0–40)
Albumin: 4 g/dL (ref 3.9–4.9)
Alkaline Phosphatase: 129 [IU]/L — ABNORMAL HIGH (ref 44–121)
BUN/Creatinine Ratio: 14 (ref 12–28)
BUN: 11 mg/dL (ref 8–27)
Bilirubin Total: 0.6 mg/dL (ref 0.0–1.2)
CO2: 22 mmol/L (ref 20–29)
Calcium: 9.7 mg/dL (ref 8.7–10.3)
Chloride: 101 mmol/L (ref 96–106)
Creatinine, Ser: 0.81 mg/dL (ref 0.57–1.00)
Globulin, Total: 2.1 g/dL (ref 1.5–4.5)
Glucose: 138 mg/dL — ABNORMAL HIGH (ref 70–99)
Potassium: 4.4 mmol/L (ref 3.5–5.2)
Sodium: 139 mmol/L (ref 134–144)
Total Protein: 6.1 g/dL (ref 6.0–8.5)
eGFR: 81 mL/min/{1.73_m2} (ref >59)

## 2022-12-27 LAB — CBC WITH DIFFERENTIAL/PLATELET
Basophils Absolute: 0 x10E3/uL (ref 0.0–0.2)
Basos: 0 %
EOS (ABSOLUTE): 0.1 x10E3/uL (ref 0.0–0.4)
Eos: 1 %
Hematocrit: 44.8 % (ref 34.0–46.6)
Hemoglobin: 15 g/dL (ref 11.1–15.9)
Immature Grans (Abs): 0 x10E3/uL (ref 0.0–0.1)
Immature Granulocytes: 0 %
Lymphocytes Absolute: 2.2 x10E3/uL (ref 0.7–3.1)
Lymphs: 24 %
MCH: 30.9 pg (ref 26.6–33.0)
MCHC: 33.5 g/dL (ref 31.5–35.7)
MCV: 92 fL (ref 79–97)
Monocytes Absolute: 0.7 x10E3/uL (ref 0.1–0.9)
Monocytes: 7 %
Neutrophils Absolute: 6.2 x10E3/uL (ref 1.4–7.0)
Neutrophils: 68 %
Platelets: 285 x10E3/uL (ref 150–450)
RBC: 4.86 x10E6/uL (ref 3.77–5.28)
RDW: 12 % (ref 11.7–15.4)
WBC: 9.3 x10E3/uL (ref 3.4–10.8)

## 2022-12-27 LAB — TSH: TSH: 2.6 u[IU]/mL (ref 0.450–4.500)

## 2022-12-27 NOTE — Progress Notes (Signed)
D-dimer is in normal range and we do not need to proceed with doppler imaging. I do want you to start diuretic and start wearing compression stockings.

## 2022-12-27 NOTE — Progress Notes (Signed)
[name],   WBC and hemoglobin normal.  Thyroid looks great! Kidney function looks good.  One liver enzyme a little elevated. Will monitor.   Marylene Land,   What about the STAT d-dimer??

## 2022-12-28 LAB — TOXASSURE SELECT,+ANTIDEPR,UR

## 2022-12-31 ENCOUNTER — Other Ambulatory Visit: Payer: Self-pay | Admitting: Registered Nurse

## 2023-01-01 ENCOUNTER — Ambulatory Visit (INDEPENDENT_AMBULATORY_CARE_PROVIDER_SITE_OTHER): Payer: Medicare Other | Admitting: Licensed Clinical Social Worker

## 2023-01-01 DIAGNOSIS — F411 Generalized anxiety disorder: Secondary | ICD-10-CM

## 2023-01-01 DIAGNOSIS — F331 Major depressive disorder, recurrent, moderate: Secondary | ICD-10-CM

## 2023-01-01 DIAGNOSIS — G894 Chronic pain syndrome: Secondary | ICD-10-CM

## 2023-01-01 NOTE — Progress Notes (Signed)
Virtual Visit via Video Note  I connected with [name] on 01/01/23 at  8:00 AM EST by a video enabled telemedicine application and verified that I am speaking with the correct person using two identifiers.  Location: Patient: home Provider: office   I discussed the limitations of evaluation and management by telemedicine and the availability of in person appointments. The patient expressed understanding and agreed to proceed.    I discussed the assessment and treatment plan with the patient. The patient was provided an opportunity to ask questions and all were answered. The patient agreed with the plan and demonstrated an understanding of the instructions.   The patient was advised to call back or seek an in-person evaluation if the symptoms worsen or if the condition fails to improve as anticipated.  I provided 20 minutes of non-face-to-face time during this encounter.  THERAPIST PROGRESS NOTE  Session Time: 8:00 AM to 8:20 AM  Participation Level: Active  Behavioral Response: CasualAlertsubdued  Type of Therapy: Individual Therapy  Treatment Goals addressed: Patient shares mood improves through interaction and therapy, this happens through positive topics, through working through stressors  ProgressTowards Goals: Progressing-patient not feeling well therapist utilized supportive and strength-based interventions  Interventions: Solution Focused, Strength-based, Supportive, and Other: coping  Summary: [name] is a 62 y.o. female who presents with not doing well head hurts. Therapist made the point that wished didn't feel bad happening more lately. Talked about waking up early in general. Had three different alarms had to sleep as long as could late at night to get chores done.  Therapist also pointed out she usually had 2 or 3 jobs as well. Wedding anniversary 23 years tomorrow. Just another day. Sometimes celebrated with Thanksgiving.  Therapist pointed  out on to feel bad she is not the only 1 patient said she figures she is not the only one.  Therapist wished patient happy Thanksgiving head hurts wants to turn off the light does not feel well so we had a shorter session today therapist provided support and space for patient to talk about thoughts and feelings in session.  Provider used empathetic listening direct questioning talked about relisted coping skills.  Suicidal/Homicidal: No  Plan: Return again in 2 weeks.2.Work on Pharmacologist for stressors process thoughts and feelings in session  Diagnosis: major depressive disorder, recurrent, moderate, degenerative disc disease, chronic pain syndrome, generalized anxiety disorder  Collaboration of Care: Other none needed  Patient/Guardian was advised Release of Information must be obtained prior to any record release in order to collaborate their care with an outside provider. Patient/Guardian was advised if they have not already done so to contact the registration department to sign all necessary forms in order for [place] to release information regarding their care.   Consent: Patient/Guardian gives verbal consent for treatment and assignment of benefits for services provided during this visit. Patient/Guardian expressed understanding and agreed to proceed.   Coolidge Breeze, LCSW 01/01/2023

## 2023-01-08 LAB — D-DIMER, QUANTITATIVE: D-DIMER: 0.28 ug{FEU}/mL (ref <=0.56)

## 2023-01-15 ENCOUNTER — Encounter (HOSPITAL_COMMUNITY): Payer: Self-pay

## 2023-01-15 ENCOUNTER — Ambulatory Visit (INDEPENDENT_AMBULATORY_CARE_PROVIDER_SITE_OTHER): Payer: Medicare Other | Admitting: Licensed Clinical Social Worker

## 2023-01-15 DIAGNOSIS — F411 Generalized anxiety disorder: Secondary | ICD-10-CM

## 2023-01-15 DIAGNOSIS — G894 Chronic pain syndrome: Secondary | ICD-10-CM

## 2023-01-15 DIAGNOSIS — F331 Major depressive disorder, recurrent, moderate: Secondary | ICD-10-CM

## 2023-01-15 NOTE — Progress Notes (Addendum)
Therapist attempted to contact patient when she did not show on video calling her home number says connectivity is down also tried calling cousins MR did not get any answer session and no-show Husband called and reported that she had a bad migraine so didn't wake her up. This session will be no charge based on medical issues.

## 2023-01-15 NOTE — Addendum Note (Signed)
Addended by: Coolidge Breeze A on: 01/15/2023 08:49 AM   Modules accepted: Level of Service

## 2023-01-17 NOTE — Progress Notes (Signed)
Normal D-dimer.

## 2023-01-23 ENCOUNTER — Other Ambulatory Visit: Payer: Self-pay | Admitting: Physician Assistant

## 2023-01-23 ENCOUNTER — Telehealth: Payer: Self-pay | Admitting: Family Medicine

## 2023-01-23 ENCOUNTER — Ambulatory Visit (INDEPENDENT_AMBULATORY_CARE_PROVIDER_SITE_OTHER): Payer: Medicare Other | Admitting: Family Medicine

## 2023-01-23 ENCOUNTER — Encounter: Payer: Self-pay | Admitting: Family Medicine

## 2023-01-23 VITALS — Ht 68.5 in | Wt 255.0 lb

## 2023-01-23 VITALS — BP 117/45 | HR 94 | Ht 67.0 in | Wt 255.0 lb

## 2023-01-23 DIAGNOSIS — Z23 Encounter for immunization: Secondary | ICD-10-CM | POA: Diagnosis not present

## 2023-01-23 DIAGNOSIS — F331 Major depressive disorder, recurrent, moderate: Secondary | ICD-10-CM | POA: Diagnosis not present

## 2023-01-23 DIAGNOSIS — I83893 Varicose veins of bilateral lower extremities with other complications: Secondary | ICD-10-CM | POA: Diagnosis not present

## 2023-01-23 DIAGNOSIS — E1142 Type 2 diabetes mellitus with diabetic polyneuropathy: Secondary | ICD-10-CM

## 2023-01-23 DIAGNOSIS — E039 Hypothyroidism, unspecified: Secondary | ICD-10-CM | POA: Diagnosis not present

## 2023-01-23 DIAGNOSIS — Z0000 Encounter for general adult medical examination without abnormal findings: Secondary | ICD-10-CM

## 2023-01-23 MED ORDER — LEVOTHYROXINE SODIUM 25 MCG PO TABS: 25.0000 ug | ORAL_TABLET | Freq: Every day | ORAL | 0 refills | Status: DC

## 2023-01-23 MED ORDER — LEVOTHYROXINE SODIUM 25 MCG PO TABS: 25.0000 ug | ORAL_TABLET | Freq: Every day | ORAL | 1 refills | Status: DC

## 2023-01-23 NOTE — Assessment & Plan Note (Signed)
A1c uncontrolled at 8.7.  She is tolerating the medication well she has been on it for about 3-1/2 weeks and so far has not had any problems or complications.  Will continue for another 2 months and then repeat A1c and lipids at that time.  1 to get her glucose down to 40 recheck lipids.

## 2023-01-23 NOTE — Progress Notes (Signed)
Established Patient Office Visit  Subjective  Patient ID: [name], female    DOB: September 24, 1960  Age: 62 y.o. MRN: [id number]  Chief Complaint  Patient presents with   Diabetes    HPI  Diabetes - no hypoglycemic events. No wounds or sores that are not healing well. No increased thirst or urination. Checking glucose at home. Taking medications as prescribed without any side effects.  She recently followed up for some darkened skin on her lower legs and the provider did an A1c and it was quite elevated.  He has been wearing compression stockings on her legs.  She says they are really not painful or bothersome really her granddaughter initially noticed the darkened skin and then that is when she actually noticed it.  F/U thyroid      ROS    Objective:     BP (!) 117/45   Pulse 94   Ht 5\' 7"  (1.702 m)   Wt 255 lb (115.7 kg)   BMI 39.94 kg/m    Physical Exam Vitals and nursing note reviewed.  Constitutional:      Appearance: Normal appearance.  HENT:     Head: Normocephalic and atraumatic.  Eyes:     Conjunctiva/sclera: Conjunctivae normal.  Cardiovascular:     Rate and Rhythm: Normal rate and regular rhythm.  Pulmonary:     Effort: Pulmonary effort is normal.     Breath sounds: Normal breath sounds.  Skin:    General: Skin is warm and dry.  Neurological:     Mental Status: She is alert.  Psychiatric:        Mood and Affect: Mood normal.      No results found for any visits on 01/23/23.    The ASCVD Risk score (Arnett DK, et al., 2019) failed to calculate for the following reasons:   Cannot find a previous HDL lab   Cannot find a previous total cholesterol lab    Assessment & Plan:   Problem List Items Addressed This Visit       Cardiovascular and Mediastinum   Varicose veins of leg with swelling, bilateral   Continue wearing compression stockings.  Really has not had any pain or discomfort.  We were hoping the Marcelline Deist might also help  pull little bit of the fluid out as she does have a little bit of pitting edema.        Endocrine   Hypothyroid   Lab Results  Component Value Date   TSH 2.600 12/26/2022   Also recently had TSH updated.  TSH at goal.  Will make sure that she has refills.      Relevant Medications   levothyroxine (SYNTHROID) 25 MCG tablet   Diabetes type 2, controlled (HCC) - Primary   A1c uncontrolled at 8.7.  She is tolerating the medication well she has been on it for about 3-1/2 weeks and so far has not had any problems or complications.  Will continue for another 2 months and then repeat A1c and lipids at that time.  1 to get her glucose down to 40 recheck lipids.        Other   MDD (major depressive disorder), recurrent episode (HCC)   She reports that she is doing okay on her current regimen.  Currently on Celexa, Lamictal and follows with psychiatry.        Other Visit Diagnoses       Encounter for immunization       Relevant Orders  Pneumococcal conjugate vaccine 20-valent (Completed)   Varicella-zoster vaccine IM (Completed)       Return in about 2 months (around 03/26/2023) for Diabetes follow-up, check cholesterol .    Frances Gasser, MD

## 2023-01-23 NOTE — Assessment & Plan Note (Signed)
She reports that she is doing okay on her current regimen.  Currently on Celexa, Lamictal and follows with psychiatry.

## 2023-01-23 NOTE — Assessment & Plan Note (Signed)
Continue wearing compression stockings.  Really has not had any pain or discomfort.  We were hoping the Marcelline Deist might also help pull little bit of the fluid out as she does have a little bit of pitting edema.

## 2023-01-23 NOTE — Telephone Encounter (Signed)
PLease sall pt and see if can do a urine for urine micro

## 2023-01-23 NOTE — Progress Notes (Signed)
Subjective:   [name] is a 62 y.o. female who presents for Medicare Annual (Subsequent) preventive examination.  Visit Complete: Virtual I connected with  [name] on 01/23/23 by a audio enabled telemedicine application and verified that I am speaking with the correct person using two identifiers.  Patient Location: Home  Provider Location: Office/Clinic  I discussed the limitations of evaluation and management by telemedicine. The patient expressed understanding and agreed to proceed.  Vital Signs: Because this visit was a virtual/telehealth visit, some criteria may be missing or patient reported. Any vitals not documented were not able to be obtained and vitals that have been documented are patient reported.  Patient Medicare AWV questionnaire was completed by the patient on n/a; I have confirmed that all information answered by patient is correct and no changes since this date.  Cardiac Risk Factors include: diabetes mellitus;sedentary lifestyle;obesity (BMI >30kg/m2)     Objective:    Today's Vitals   01/23/23 1454 01/23/23 1455  Weight: 255 lb (115.7 kg)   Height: 5' 8.5" (1.74 m)   PainSc:  8    Body mass index is 38.21 kg/m.     01/23/2023    3:12 PM 01/19/2022    2:10 PM 06/29/2021   10:37 AM 04/30/2020    8:18 AM 10/25/2016    9:02 AM 08/25/2016    8:52 AM 07/14/2016    9:18 AM  Advanced Directives  Does Patient Have a Medical Advance Directive? Yes Yes Yes Yes Yes Yes Yes  Type of Estate agent of [place];Living will Living will Healthcare Power of [employer] Power of [place];Living will Healthcare Power of [place];Living will Healthcare Power of [place];Living will Healthcare Power of [place];Living will  Does patient want to make changes to medical advance directive? No - Patient declined No - Patient declined  No - Patient declined     Copy of Healthcare Power of Attorney in Chart?    No - copy requested  No - copy requested No - copy requested No - copy requested    Current Medications (verified) Outpatient Encounter Medications as of 01/23/2023  Medication Sig   citalopram (CELEXA) 40 MG tablet TAKE 1 TABLET(40 MG) BY MOUTH AT BEDTIME   Dapagliflozin Pro-metFORMIN ER (XIGDUO XR) 11-998 MG TB24 Take 1 tablet by mouth daily.   eletriptan (RELPAX) 20 MG tablet TAKE 1 TABLET BY MOUTH AS NEEDED FOR MIGRAINE HEADACHE. MAY REPEAT IN 2 HOURS IF HEADACHE PERSISTS OR RECURS.   gabapentin (NEURONTIN) 800 MG tablet TAKE 1 TABLET(800 MG) BY MOUTH FOUR TIMES DAILY   hydrochlorothiazide (HYDRODIURIL) 12.5 MG tablet Take one tablet as needed for lower leg swelling.   lamoTRIgine (LAMICTAL) 25 MG tablet TAKE 1 TABLET(25 MG) BY MOUTH TWICE DAILY   levothyroxine (SYNTHROID) 25 MCG tablet Take 1 tablet (25 mcg total) by mouth daily before breakfast.   LINZESS 145 MCG CAPS capsule TAKE 1 CAPSULE(145 MCG) BY MOUTH DAILY   morphine (MS CONTIN) 30 MG 12 hr tablet Take 1 tablet (30 mg total) by mouth every 12 (twelve) hours. Do Not Fill Before 12/26/2022   morphine (MS CONTIN) 60 MG 12 hr tablet Take 1 tablet (60 mg total) by mouth every 12 (twelve) hours. Do Not Fill Before 12/26/2022   morphine (MSIR) 15 MG tablet Take 1 tablet (15 mg total) by mouth every 6 (six) hours as needed for severe pain (pain score 7-10).   naloxone (NARCAN) nasal spray 4 mg/0.1 mL Lethargic or Respiratory Depression after  opoid use   nortriptyline (PAMELOR) 50 MG capsule TAKE 2 CAPSULES BY MOUTH AT BEDTIME   promethazine (PHENERGAN) 12.5 MG tablet TAKE 1 TABLET(12.5 MG) BY MOUTH EVERY 12 HOURS   tiZANidine (ZANAFLEX) 2 MG tablet TAKE 1 TABLET(2 MG) BY MOUTH THREE TIMES DAILY AS NEEDED FOR MUSCLE SPASMS   tolterodine (DETROL LA) 4 MG 24 hr capsule TAKE 1 CAPSULE(4 MG) BY MOUTH DAILY. NO REFILLS. NEEDS AN APPT. LAST APPT WAS 08/2019.   [DISCONTINUED] levothyroxine (SYNTHROID) 25 MCG tablet Take by mouth.   No facility-administered  encounter medications on file as of 01/23/2023.    Allergies (verified) Sumatriptan, Aspirin, Ibuprofen, Imitrex [sumatriptan base], Norvasc [amlodipine besylate], Nsaids, Tape, and Topamax [topiramate]   History: Past Medical History:  Diagnosis Date   Allergy    Anxiety    Cancer (HCC)    cancerous nodules on thyroid   Cancer (HCC)    basal cell Cancer-nose   Chronic pain syndrome    COPD (chronic obstructive pulmonary disease) (HCC)    Degeneration of lumbar or lumbosacral intervertebral disc    Depression    Facet syndrome, lumbar    GERD (gastroesophageal reflux disease)    Hyperlipidemia    Intervertebral lumbar disc disorder with myelopathy, lumbar region    Lumbosacral spondylosis without myelopathy    Migraine without aura, with intractable migraine, so stated, without mention of status migrainosus    Primary localized osteoarthrosis, lower leg    Thyroid disease    hypothyroid   Unspecified musculoskeletal disorders and symptoms referable to neck    cervical/trapezius   Past Surgical History:  Procedure Laterality Date   ABDOMINAL HYSTERECTOMY  06/2002   Basel Cell Carcinoma  06/2005, 07/2005   nasal tip and reconstruction   BREAST CYST ASPIRATION     CARPAL TUNNEL RELEASE  07/1993   both hands   COLONOSCOPY     COSMETIC SURGERY  Basal cell carcinoma of nose   HEMORROIDECTOMY  08/2001   JOINT REPLACEMENT  Left knee replacement   KNEE ARTHROPLASTY  09/2001, 05/2003   left knee, chondromalasia   KNEE ARTHROSCOPY  02/2003   Right knee, tisse release, chrondromalacia   REPLACEMENT TOTAL KNEE  12/2003   Left    THYROID LOBECTOMY  01/2005   malignant area removed from right lobe   THYROID SURGERY     thyroid lobe removal   TONSILLECTOMY  09/1972   TUBAL LIGATION  10/1997   Ulnar nerve entrapment  03/1993   left elbow   Family History  Problem Relation Age of Onset   Depression Mother    Hypertension Mother    Anxiety disorder Mother    Obesity Mother     Varicose Veins Mother    Hypertension Father    Heart failure Father    Diabetes Father    Heart attack Father    Arthritis Father    Cancer Father    Obesity Father    Colon cancer Neg Hx    Esophageal cancer Neg Hx    Stomach cancer Neg Hx    Rectal cancer Neg Hx    Social History   Socioeconomic History   Marital status: Married    Spouse name: Sullivan Lone   Number of children: 2   Years of education: some college   Highest education level: Some college, no degree  Occupational History   Occupation: On disability    Employer: UNEMPLOYED  Tobacco Use   Smoking status: Some Days    Current packs/day: 0.00  Types: Cigarettes    Last attempt to quit: 02/05/1973    Years since quitting: 49.9   Smokeless tobacco: Never  Vaping Use   Vaping status: Never Used  Substance and Sexual Activity   Alcohol use: No   Drug use: No   Sexual activity: Not Currently    Partners: Male    Birth control/protection: None  Other Topics Concern   Not on file  Social History Narrative   On disability. Lives with her husband. She has one adopted daughter. She likes to read in her free time. She does chair exercises for 30-45 mins everyday.   Social Drivers of Health   Financial Resource Strain: Medium Risk (01/23/2023)   Overall Financial Resource Strain (CARDIA)    Difficulty of Paying Living Expenses: Somewhat hard  Food Insecurity: No Food Insecurity (01/22/2023)   Hunger Vital Sign    Worried About Running Out of Food in the Last Year: Never true    Ran Out of Food in the Last Year: Never true  Transportation Needs: No Transportation Needs (01/23/2023)   PRAPARE - Administrator, Civil Service (Medical): No    Lack of Transportation (Non-Medical): No  Physical Activity: Unknown (01/22/2023)   Exercise Vital Sign    Days of Exercise per Week: 0 days    Minutes of Exercise per Session: Not on file  Stress: Stress Concern Present (01/23/2023)   [employer] of  Occupational Health - Occupational Stress Questionnaire    Feeling of Stress : To some extent  Social Connections: Unknown (01/23/2023)   Social Connection and Isolation Panel [NHANES]    Frequency of Communication with Friends and Family: Patient declined    Frequency of Social Gatherings with Friends and Family: Patient declined    Attends Religious Services: Never    Database administrator or Organizations: No    Attends Engineer, structural: Never    Marital Status: Married    Tobacco Counseling Ready to quit: Not Answered Counseling given: Not Answered   Clinical Intake:  Pre-visit preparation completed: No  Pain : 0-10 Pain Score: 8  Pain Type: Chronic pain Pain Location: Knee (hips, tail bone, low back, left shoulder blade) Pain Orientation: Other (Comment) Pain Radiating Towards: does not radiate Pain Descriptors / Indicators: Constant Pain Onset: More than a month ago Pain Frequency: Constant Pain Relieving Factors: sees pain medicine Effect of Pain on Daily Activities: decreases your activity level  Pain Relieving Factors: sees pain medicine  BMI - recorded: 38.2 Nutritional Status: BMI > 30  Obese Nutritional Risks: None Diabetes: Yes CBG done?: No (virtual) Did pt. bring in CBG monitor from home?: No  How often do you need to have someone help you when you read instructions, pamphlets, or other written materials from your doctor or pharmacy?: 1 - Never What is the last grade level you completed in school?: 13  Interpreter Needed?: No      Activities of Daily Living    01/23/2023    2:58 PM  In your present state of health, do you have any difficulty performing the following activities:  Hearing? 0  Vision? 0  Difficulty concentrating or making decisions? 1  Comment forgets some times  Walking or climbing stairs? 1  Comment avoids stairs  Dressing or bathing? 0  Doing errands, shopping? 1  Comment a friend runs errands, husband helps   Quarry manager and eating ? Y  Comment someone assist with this  Using the Toilet? N  In the past six months, have you accidently leaked urine? Y  Do you have problems with loss of bowel control? N  Managing your Medications? N  Managing your Finances? N  Housekeeping or managing your Housekeeping? Y  Comment has help with home management    Patient Care Team: Agapito Games, MD as PCP - General (Family Medicine) Faith Rogue, pain management  Coolidge Breeze, counselor     Indicate any recent Medical Services you may have received from other than [facility] providers in the past year (date may be approximate).     Assessment:   This is a routine wellness examination for Saraphina.  Hearing/Vision screen Hearing Screening - Comments:: Unable to test, grossly intact.  Vision Screening - Comments:: Unable to test, wears glasses    Goals Addressed             This Visit's Progress    Mobility and Independence Optimized       Evidence-based guidance:  Refer to physical therapy or occupational therapy for assessment and individualized program.  Provide therapy that may include functional task training, balance, active or passive exercise, spasticity management, assistive device training, cardiorespiratory fitness, Pilates and telerehabilitation services.  Identify barriers to participation in therapy or exercise such as pain with activity, anticipated or imagined pain, transportation, depression or fear.  Work with patient and family to develop self-management plan to remove barriers.  Refer to speech/language pathologist to assess and treat speech/language deficit and swallowing impairment.  Periodically review ability to perform activities of daily living and required amount of assistance needed for safety, optimal independence and self-care.  Encourage appropriate vocational or educational counseling for re-entering the community, workplace or school; consider a driving  evaluation.  Assist patient to advocate for necessary adaptations to the work or school environment.  Refer to employer for information about FMLA (Family and Medical Leave Act), Short or Long-Term Disability and options for adjustments to work schedule, assignment or hours.   Notes: prevent falls.       Depression Screen    01/23/2023    3:08 PM 11/28/2022    8:24 AM 09/27/2022    8:25 AM 08/09/2022    8:44 AM 06/14/2022    9:23 AM 05/09/2022    8:21 AM 04/07/2022    8:52 AM  PHQ 2/9 Scores  PHQ - 2 Score 2 2 2 6 6 2 2   PHQ- 9 Score 10          Fall Risk    01/23/2023    3:13 PM 01/23/2023   12:18 PM 12/26/2022    8:56 AM 11/28/2022    8:24 AM 09/27/2022    8:25 AM  Fall Risk   Falls in the past year? 0 1 0 0 0  Number falls in past yr: 0 1  0 0  Injury with Fall? 0 1  0 0  Risk for fall due to : History of fall(s) Impaired mobility;History of fall(s)     Risk for fall due to: Comment has fallen 3 times in past 5 years.      Follow up  Falls evaluation completed       MEDICARE RISK AT HOME: Medicare Risk at Home Any stairs in or around the home?: Yes If so, are there any without handrails?: Yes Home free of loose throw rugs in walkways, pet beds, electrical cords, etc?: Yes Adequate lighting in your home to reduce risk of falls?: Yes Life alert?: No Use of a cane, walker  or w/c?: Yes Grab bars in the bathroom?: Yes Shower chair or bench in shower?: Yes Elevated toilet seat or a handicapped toilet?: Yes  TIMED UP AND GO:  Was the test performed?  No    Cognitive Function:        01/23/2023    3:15 PM 01/19/2022    2:16 PM 04/30/2020    8:34 AM  6CIT Screen  What Year? 0 points 0 points 0 points  What month?  0 points 0 points  What time? 0 points 0 points 3 points  Count back from 20 0 points 0 points 0 points  Months in reverse 0 points 0 points 0 points  Repeat phrase 0 points 0 points 0 points  Total Score  0 points 3 points     Immunizations Immunization History  Administered Date(s) Administered   PNEUMOCOCCAL CONJUGATE-20 01/23/2023   Pneumococcal Polysaccharide-23 04/20/2011   Zoster Recombinant(Shingrix) 01/23/2023    TDAP status: Up to date  Flu Vaccine status: Declined, Education has been provided regarding the importance of this vaccine but patient still declined. Advised may receive this vaccine at local pharmacy or Health Dept. Aware to provide a copy of the vaccination record if obtained from local pharmacy or Health Dept. Verbalized acceptance and understanding.  Pneumococcal vaccine status: Up to date  Covid-19 vaccine status: Declined, Education has been provided regarding the importance of this vaccine but patient still declined. Advised may receive this vaccine at local pharmacy or Health Dept.or vaccine clinic. Aware to provide a copy of the vaccination record if obtained from local pharmacy or Health Dept. Verbalized acceptance and understanding.  Qualifies for Shingles Vaccine? Yes   Zostavax completed No   Shingrix Completed?: Yes  Screening Tests Health Maintenance  Topic Date Due   COVID-19 Vaccine (1) Never done   OPHTHALMOLOGY EXAM  Never done   HIV Screening  Never done   Diabetic kidney evaluation - Urine ACR  Never done   DTaP/Tdap/Td (1 - Tdap) Never done   MAMMOGRAM  11/08/2019   Colonoscopy  04/25/2021   INFLUENZA VACCINE  05/07/2023 (Originally 09/07/2022)   Zoster Vaccines- Shingrix (2 of 2) 03/20/2023   HEMOGLOBIN A1C  06/25/2023   Diabetic kidney evaluation - eGFR measurement  12/26/2023   FOOT EXAM  12/26/2023   Medicare Annual Wellness (AWV)  01/23/2024   Pneumococcal Vaccine 37-67 Years old  Completed   Hepatitis C Screening  Completed   HPV VACCINES  Aged Out    Health Maintenance  Health Maintenance Due  Topic Date Due   COVID-19 Vaccine (1) Never done   OPHTHALMOLOGY EXAM  Never done   HIV Screening  Never done   Diabetic kidney evaluation - Urine  ACR  Never done   DTaP/Tdap/Td (1 - Tdap) Never done   MAMMOGRAM  11/08/2019   Colonoscopy  04/25/2021    Colorectal cancer screening: Type of screening: Colonoscopy. Completed 04/26/2011. Repeat every 10 years  Mammogram status: Ordered 05/10/2022. Pt provided with contact info and advised to call to schedule appt.   Start bone density at age 34  Lung Cancer Screening: (Low Dose CT Chest recommended if Age 54-80 years, 20 pack-year currently smoking OR have quit w/in 15years.) does qualify.   Lung Cancer Screening Referral: will call insurance to see if it is covered.   Additional Screening:  Hepatitis C Screening: does qualify; Completed 10/11/2017  Vision Screening: Recommended annual ophthalmology exams for early detection of glaucoma and other disorders of the eye. Is the patient  up to date with their annual eye exam?  Yes  Who is the provider or what is the name of the office in which the patient attends annual eye exams? [facility]  If pt is not established with a provider, would they like to be referred to a provider to establish care? No .   Dental Screening: Recommended annual dental exams for proper oral hygiene  Diabetic Foot Exam: Diabetic Foot Exam: Completed 12/26/2022  Community Resource Referral / Chronic Care Management: CRR required this visit?  No   CCM required this visit?  No     Plan:     I have personally reviewed and noted the following in the patient's chart:   Medical and social history Use of alcohol, tobacco or illicit drugs  Current medications and supplements including opioid prescriptions. Patient is currently taking opioid prescriptions. Information provided to patient regarding non-opioid alternatives. Patient advised to discuss non-opioid treatment plan with their provider. Functional ability and status Nutritional status Physical activity Advanced directives List of other physicians Hospitalizations, surgeries, and ER visits in  previous 12 months Vitals Screenings to include cognitive, depression, and falls Referrals and appointments  In addition, I have reviewed and discussed with patient certain preventive protocols, quality metrics, and best practice recommendations. A written personalized care plan for preventive services as well as general preventive health recommendations were provided to patient.     Novella Olive, FNP   01/23/2023   After Visit Summary: (MyChart) Due to this being a telephonic visit, the after visit summary with patients personalized plan was offered to patient via MyChart   Follow-up with PCP as scheduled. [facility] colonoscopy with [facility] Discuss with PCP about low dose lung cancer screening and check with insurance for coverage.  Get your mammogram that was ordered in April 2024.  Due for eye exam this month.

## 2023-01-23 NOTE — Assessment & Plan Note (Signed)
Lab Results  Component Value Date   TSH 2.600 12/26/2022   Also recently had TSH updated.  TSH at goal.  Will make sure that she has refills.

## 2023-01-25 ENCOUNTER — Encounter: Payer: Self-pay | Admitting: Registered Nurse

## 2023-01-25 ENCOUNTER — Encounter: Payer: Medicare Other | Attending: Physical Medicine and Rehabilitation | Admitting: Registered Nurse

## 2023-01-25 ENCOUNTER — Other Ambulatory Visit: Payer: Self-pay

## 2023-01-25 VITALS — BP 123/55 | HR 96 | Ht 68.5 in | Wt 255.0 lb

## 2023-01-25 DIAGNOSIS — Z5181 Encounter for therapeutic drug level monitoring: Secondary | ICD-10-CM | POA: Diagnosis not present

## 2023-01-25 DIAGNOSIS — M1712 Unilateral primary osteoarthritis, left knee: Secondary | ICD-10-CM

## 2023-01-25 DIAGNOSIS — M2550 Pain in unspecified joint: Secondary | ICD-10-CM | POA: Diagnosis not present

## 2023-01-25 DIAGNOSIS — M47816 Spondylosis without myelopathy or radiculopathy, lumbar region: Secondary | ICD-10-CM | POA: Diagnosis not present

## 2023-01-25 DIAGNOSIS — M7061 Trochanteric bursitis, right hip: Secondary | ICD-10-CM

## 2023-01-25 DIAGNOSIS — Z79891 Long term (current) use of opiate analgesic: Secondary | ICD-10-CM

## 2023-01-25 DIAGNOSIS — G8929 Other chronic pain: Secondary | ICD-10-CM | POA: Insufficient documentation

## 2023-01-25 DIAGNOSIS — G894 Chronic pain syndrome: Secondary | ICD-10-CM | POA: Diagnosis not present

## 2023-01-25 DIAGNOSIS — E1142 Type 2 diabetes mellitus with diabetic polyneuropathy: Secondary | ICD-10-CM

## 2023-01-25 DIAGNOSIS — M7062 Trochanteric bursitis, left hip: Secondary | ICD-10-CM | POA: Diagnosis not present

## 2023-01-25 DIAGNOSIS — M1711 Unilateral primary osteoarthritis, right knee: Secondary | ICD-10-CM

## 2023-01-25 DIAGNOSIS — M546 Pain in thoracic spine: Secondary | ICD-10-CM | POA: Diagnosis not present

## 2023-01-25 MED ORDER — MORPHINE SULFATE 15 MG PO TABS: 15.0000 mg | ORAL_TABLET | Freq: Four times a day (QID) | ORAL | 0 refills | Status: DC | PRN

## 2023-01-25 MED ORDER — MORPHINE SULFATE ER 60 MG PO TBCR: 60.0000 mg | EXTENDED_RELEASE_TABLET | Freq: Two times a day (BID) | ORAL | 0 refills | Status: DC

## 2023-01-25 MED ORDER — MORPHINE SULFATE ER 30 MG PO TBCR: 30.0000 mg | EXTENDED_RELEASE_TABLET | Freq: Two times a day (BID) | ORAL | 0 refills | Status: DC

## 2023-01-25 NOTE — Progress Notes (Signed)
Subjective:    Patient ID: [name], female    DOB: 14-Sep-1960, 62 y.o.   MRN: [id number]  HPI: [name] is a 62 y.o. female who returns for follow up appointment for chronic pain and medication refill. She states her pain is located in her mid- lower back, bilateral hips and bilateral knees R>L. She also reports generalized joint pain. She rates her pain 8. Her current exercise regime is walking and performing stretching exercises.  Ms.  Morales Morphine equivalent is 230.00 MME.   Last UDS was Performed on 12/26/2022, it was consistent.     Pain Inventory Average Pain 9 Pain Right Now 8 My pain is constant, sharp, burning, dull, stabbing, tingling, and aching  In the last 24 hours, has pain interfered with the following? General activity 10 Relation with others 10 Enjoyment of life 10 What TIME of day is your pain at its worst? varies Sleep (in general) Poor  Pain is worse with: walking, bending, standing, and some activites Pain improves with: rest, heat/ice, therapy/exercise, pacing activities, medication, TENS, and injections Relief from Meds: 6  Family History  Problem Relation Age of Onset   Depression Mother    Hypertension Mother    Anxiety disorder Mother    Obesity Mother    Varicose Veins Mother    Hypertension Father    Heart failure Father    Diabetes Father    Heart attack Father    Arthritis Father    Cancer Father    Obesity Father    Colon cancer Neg Hx    Esophageal cancer Neg Hx    Stomach cancer Neg Hx    Rectal cancer Neg Hx    Social History   Socioeconomic History   Marital status: Married    Spouse name: Sullivan Lone   Number of children: 2   Years of education: some college   Highest education level: Some college, no degree  Occupational History   Occupation: On disability    Employer: UNEMPLOYED  Tobacco Use   Smoking status: Some Days    Current packs/day: 0.00    Types: Cigarettes    Last attempt to quit:  02/05/1973    Years since quitting: 50.0   Smokeless tobacco: Never  Vaping Use   Vaping status: Never Used  Substance and Sexual Activity   Alcohol use: No   Drug use: No   Sexual activity: Not Currently    Partners: Male    Birth control/protection: None  Other Topics Concern   Not on file  Social History Narrative   On disability. Lives with her husband. She has one adopted daughter. She likes to read in her free time. She does chair exercises for 30-45 mins everyday.   Social Drivers of Health   Financial Resource Strain: Medium Risk (01/23/2023)   Overall Financial Resource Strain (CARDIA)    Difficulty of Paying Living Expenses: Somewhat hard  Food Insecurity: No Food Insecurity (01/22/2023)   Hunger Vital Sign    Worried About Running Out of Food in the Last Year: Never true    Ran Out of Food in the Last Year: Never true  Transportation Needs: No Transportation Needs (01/23/2023)   PRAPARE - Administrator, Civil Service (Medical): No    Lack of Transportation (Non-Medical): No  Physical Activity: Unknown (01/22/2023)   Exercise Vital Sign    Days of Exercise per Week: 0 days    Minutes of Exercise per Session: Not on file  Stress: Stress Concern Present (01/23/2023)   [employer] of Occupational Health - Occupational Stress Questionnaire    Feeling of Stress : To some extent  Social Connections: Unknown (01/23/2023)   Social Connection and Isolation Panel [NHANES]    Frequency of Communication with Friends and Family: Patient declined    Frequency of Social Gatherings with Friends and Family: Patient declined    Attends Religious Services: Never    Database administrator or Organizations: No    Attends Engineer, structural: Never    Marital Status: Married   Past Surgical History:  Procedure Laterality Date   ABDOMINAL HYSTERECTOMY  06/2002   Basel Cell Carcinoma  06/2005, 07/2005   nasal tip and reconstruction   BREAST CYST  ASPIRATION     CARPAL TUNNEL RELEASE  07/1993   both hands   COLONOSCOPY     COSMETIC SURGERY  Basal cell carcinoma of nose   HEMORROIDECTOMY  08/2001   JOINT REPLACEMENT  Left knee replacement   KNEE ARTHROPLASTY  09/2001, 05/2003   left knee, chondromalasia   KNEE ARTHROSCOPY  02/2003   Right knee, tisse release, chrondromalacia   REPLACEMENT TOTAL KNEE  12/2003   Left    THYROID LOBECTOMY  01/2005   malignant area removed from right lobe   THYROID SURGERY     thyroid lobe removal   TONSILLECTOMY  09/1972   TUBAL LIGATION  10/1997   Ulnar nerve entrapment  03/1993   left elbow   Past Surgical History:  Procedure Laterality Date   ABDOMINAL HYSTERECTOMY  06/2002   Basel Cell Carcinoma  06/2005, 07/2005   nasal tip and reconstruction   BREAST CYST ASPIRATION     CARPAL TUNNEL RELEASE  07/1993   both hands   COLONOSCOPY     COSMETIC SURGERY  Basal cell carcinoma of nose   HEMORROIDECTOMY  08/2001   JOINT REPLACEMENT  Left knee replacement   KNEE ARTHROPLASTY  09/2001, 05/2003   left knee, chondromalasia   KNEE ARTHROSCOPY  02/2003   Right knee, tisse release, chrondromalacia   REPLACEMENT TOTAL KNEE  12/2003   Left    THYROID LOBECTOMY  01/2005   malignant area removed from right lobe   THYROID SURGERY     thyroid lobe removal   TONSILLECTOMY  09/1972   TUBAL LIGATION  10/1997   Ulnar nerve entrapment  03/1993   left elbow   Past Medical History:  Diagnosis Date   Allergy    Anxiety    Cancer (HCC)    cancerous nodules on thyroid   Cancer (HCC)    basal cell Cancer-nose   Chronic pain syndrome    COPD (chronic obstructive pulmonary disease) (HCC)    Degeneration of lumbar or lumbosacral intervertebral disc    Depression    Facet syndrome, lumbar    GERD (gastroesophageal reflux disease)    Hyperlipidemia    Intervertebral lumbar disc disorder with myelopathy, lumbar region    Lumbosacral spondylosis without myelopathy    Migraine without aura, with  intractable migraine, so stated, without mention of status migrainosus    Primary localized osteoarthrosis, lower leg    Thyroid disease    hypothyroid   Unspecified musculoskeletal disorders and symptoms referable to neck    cervical/trapezius   BP (!) 123/55   Pulse 96   Ht 5' 8.5" (1.74 m)   Wt 255 lb (115.7 kg)   SpO2 92%   BMI 38.21 kg/m   Opioid Risk Score:  Fall Risk Score:  `1  Depression screen [facility] 2/9     01/23/2023    3:08 PM 11/28/2022    8:24 AM 09/27/2022    8:25 AM 08/09/2022    8:44 AM 06/14/2022    9:23 AM 05/09/2022    8:21 AM 04/07/2022    8:52 AM  Depression screen PHQ 2/9  Decreased Interest 1 1 1 3 3 1 1   Down, Depressed, Hopeless 1 1 1 3 3 1 1   PHQ - 2 Score 2 2 2 6 6 2 2   Altered sleeping 3        Tired, decreased energy 3        Change in appetite 1        Feeling bad or failure about yourself  1        Trouble concentrating 0        Moving slowly or fidgety/restless 0        Suicidal thoughts 0        PHQ-9 Score 10        Difficult doing work/chores Somewhat difficult           Review of Systems  Musculoskeletal:  Positive for back pain.       Bilateral hip pain Right knee pain Bilateral elbow pain  All other systems reviewed and are negative.     Objective:   Physical Exam Vitals and nursing note reviewed.  Cardiovascular:     Rate and Rhythm: Normal rate and regular rhythm.     Pulses: Normal pulses.     Heart sounds: Normal heart sounds.  Pulmonary:     Effort: Pulmonary effort is normal.     Breath sounds: Normal breath sounds.  Musculoskeletal:     Comments: Normal Muscle Bulk and Muscle Testing Reveals:  Upper Extremities: Full ROM and Muscle Strength 5/5 Wearing Right wrist spica splint.   Thoracic Paraspinal Tenderness: T-1-T-7 Mainly Left Side  Lumbar Hypersensitivity Lower Extremities: Decreased ROM and Muscle Strength 5/5 Bilateral Lower Extremities Flexion Produces Pain into her bilateral knees R.L Arises from Table  slowly  Antalgic   Gait     Skin:    General: Skin is warm and dry.  Neurological:     Mental Status: She is alert and oriented to person, place, and time.  Psychiatric:        Mood and Affect: Mood normal.        Behavior: Behavior normal.           Assessment & Plan:  1.Chronic Bilateral Thoracic Back Pain:  Continue HEP as Tolerated. Continue to Monitor. 01/25/2023 2.  Low Back Pain/ Lumbar Facet Arthropathy: Refilled: MSIR 15 mg one tablet every 6 hours as needed #100 and   Refilled: MS Contin 60 mg and MS Contin  30 mg Q 12 hours to equal   90 mg  #60/60.Marland Kitchen Second script sent to pharmacy to accommodate to scheduled appointment.  Continue Gabapentin and Pamelor. 01/25/2023 We will continue the opioid monitoring program, this consists of regular clinic visits, examinations, urine drug screen, pill counts as well as use of [place] Controlled Substance Reporting system. A 12 month History has been reviewed on the [place] Controlled Substance Reporting System on 01/25/2023. 3. Degenerative Disk Disease:Encouraged Continue HEP as Tolertated  and heat therapy. Continue Current Medication Regime.01/25/2023 4. Bilateral Greater Trochanteric Bursitis:  Continue current treatment regimen with Heat and Ice Therapy. 01/25/2023 5. Osteoarthritis of Bilateral Knees: R>L Continue current treatment modality with  home exercise program and heat therapy. 01/25/2023 6. Migraine Headaches: Continue current medication regimen  Continue with  Migraine Journal. Aimovig discontinued due to Hives. Continue Lamictal and Relpax. Continue to Monitor.  01/25/2023 7. Smoking Cessation: She has quit smoking since 08/25/2016. Continue to Monitor. 01/25/2023 8.Constipation: Continue  Current medication regime with Linzess. 01/25/2023 9. Muscle Spasm: Schedule for Trigger Point Injection with Dr Riley Kill.Continue current medication regime with Tizanidine.01/25/2023 10. DepressionAnxiety/ Panic Attacks:  Continue current medication regimen and treatment modality.  Celexa.Lamictal  and Counseling with Coolidge Breeze and  Dr. Gilmore Laroche Psychiatrist. 12/192024 11. RSD: Continue with current medication Regime with Gabapentin. 01/25/2023  12. 2+ Pitting Edema/ Purplish Discoloration to Lower Extremities: Wearing Compression Stockings. PCP following.  13. DeQuervain's Tenosynovitis: Ms. Amon wearing Thumb Spica Splint and avoid repetitive thumb movements as much as possible.    F/U in 1 months

## 2023-01-25 NOTE — Telephone Encounter (Signed)
Spoke with patient. Will stop by office tomorrow for the urine as lab order.

## 2023-01-29 ENCOUNTER — Other Ambulatory Visit: Payer: Self-pay | Admitting: Physician Assistant

## 2023-01-29 ENCOUNTER — Ambulatory Visit (HOSPITAL_COMMUNITY): Payer: Medicare Other | Admitting: Licensed Clinical Social Worker

## 2023-01-29 DIAGNOSIS — F331 Major depressive disorder, recurrent, moderate: Secondary | ICD-10-CM | POA: Diagnosis not present

## 2023-01-29 DIAGNOSIS — I83893 Varicose veins of bilateral lower extremities with other complications: Secondary | ICD-10-CM

## 2023-01-29 DIAGNOSIS — G894 Chronic pain syndrome: Secondary | ICD-10-CM | POA: Diagnosis not present

## 2023-01-29 DIAGNOSIS — F411 Generalized anxiety disorder: Secondary | ICD-10-CM | POA: Diagnosis not present

## 2023-01-29 NOTE — Progress Notes (Signed)
Virtual Visit via Video Note  I connected with [name] on 01/29/23 at  9:00 AM EST by a video enabled telemedicine application and verified that I am speaking with Frances correct person using two identifiers.  Location: Patient: home Provider: office   I discussed Frances limitations of evaluation and management by telemedicine and Frances availability of in person appointments. Frances patient expressed understanding and agreed to proceed.   I discussed Frances assessment and treatment plan with Frances patient. Frances patient was provided an opportunity to ask questions and all were answered. Frances patient agreed with Frances plan and demonstrated an understanding of Frances instructions.   Frances patient was advised to call back or seek an in-person evaluation if Frances symptoms worsen or if Frances condition fails to improve as anticipated.  I provided 50 minutes of non-face-to-face time during this encounter.  THERAPIST PROGRESS NOTE  Session Time: 8:00 AM to 8:50 AM  Participation Level: Active  Behavioral Response: CasualAlertappropriate  Type of Therapy: Individual Therapy  Treatment Goals addressed: Patient shares mood improves through interaction and therapy, this happens through positive topics, through working through stressors  ProgressTowards Goals: Progressing-patient talks about various issues helps to process thoughts and feelings helps with coping  Interventions: Solution Focused, Strength-based, Supportive, and Other: coping  Summary: [name] is a 62 y.o. female who presents with today is ok. Talked about Christmas not giving presents but bought something for their meal on Christmas. Ordering online he goes out for basics. Neither can stand long enough to cook between her back and knees. Lean on counter to butter a bagel try to stay on feel long enough to put Frances cream cheese on. Described that food just heat up and Frances food is done. [name] likes t-shirts anything to do with marines, Public house manager. Didn't even shop for [name]. [name]. Bought books for [name]. She likes books now. Patient likes comedy fantasy. Patient described some of Frances books she likes.  Reads everywhere.        Therapist shared looking back at Frances year enjoyed that she traveled noted that [name] has been everywhere patient said he loved [place] love Frances bizarre is there then showed therapist necklace Frances he gave her a ceremonial swords from [place] he was in [name] for 18 months when [name] and [name]. President [name] was there 6 months and he would have to guard him. Pictures of him with [name], [name], spend time with [name] in [place] he was entertaining and in charge of his safety. He is really proud of Frances pictures.   Doctor has on dieretic weird swelling. It is her shins skin is really red found out has varicose veins popping out in Frances front Frances purple with red made it look really purple. Checked on blood clots and doesn't have any now wearing compression sock and dieretic. Found out about diabetes. Patient sarcastically said lucky her another health issue. Diabetes runs in Frances family. Grandfather [name] was cool had fun together he had type I diabetes. Go on every other Saturday stay when grandmother go for groceries. He would ask patient for cigarettes both not supposed to be smoking go out behind chicken house and smoke. Told her Frances best place to hide Frances cigarettes was mailbox. Picked cigarettes up a couple of months ago quit 7 years quit for gil. Wants to quit for New Years. Doesn't smoke a lot not even a pack a day. [name] smokes  as well smell her and patient would take a couple from her why picked up. Smoked because of depression does calm her down and helps with anxiety.  Discussed trying to different ways to manage pain and nothing ever worked. Allergic to anything that helps like NSAIDS allergic to a lot of migraine medicine. Allergic reaction scary as hell  blocks Frances breathing passage. Imitrex blood pressure went down. Rheumatoid and osteoarthritis have both kind where pain comes from. Doesn't understand why got all of it.       Assessed patient isolated helpful to have an outlet to talk about stressors to talk about thoughts and feelings be provided with supportive strength-based interventions as well as feedback that could be helpful helps with coping.  Patient shared about her Christmas plans how she is putting together Christmas and away that is not too difficult by ordering an precook food, subject turn to other things such as Christmas present she is giving to adopted daughter and family both of [place] talked about her love of reading patient talking about some of Frances author she likes.  Patient updated therapist started smoking we explored why patient was triggered by worker who comes plus also recognizes that it helps with with depression and anxiety but plans to quit in Frances new year has quit for 7 years.  We talked as well as about new medical conditions patient making Frances comment that why she always Frances 1 in Frances family with all Frances medical conditions.  We talked about strategies for pain management and a lot have not worked for patient as she is allergic to a lot of medications.  Assessed nice to connect with patient during holiday season as we have build a relationship over time and have a strong connection. Suicidal/Homicidal: No  Plan: Return again in 2 weeks.2.Work on Pharmacologist for stressors process thoughts and feelings in session  Diagnosis: major depressive disorder, recurrent, moderate, degenerative disc disease, chronic pain syndrome, generalized anxiety disorder  Collaboration of Care: Other none needed  Patient/Guardian was advised Release of Information must be obtained prior to any record release in order to collaborate their care with an outside provider. Patient/Guardian was advised if they have not already done so to contact Frances  registration department to sign all necessary forms in order for [place] to release information regarding their care.   Consent: Patient/Guardian gives verbal consent for treatment and assignment of benefits for services provided during this visit. Patient/Guardian expressed understanding and agreed to proceed.   Coolidge Breeze, LCSW 01/29/2023

## 2023-02-12 ENCOUNTER — Ambulatory Visit (HOSPITAL_COMMUNITY): Payer: Medicare Other | Admitting: Licensed Clinical Social Worker

## 2023-02-12 DIAGNOSIS — F331 Major depressive disorder, recurrent, moderate: Secondary | ICD-10-CM

## 2023-02-12 DIAGNOSIS — F411 Generalized anxiety disorder: Secondary | ICD-10-CM | POA: Diagnosis not present

## 2023-02-12 DIAGNOSIS — G894 Chronic pain syndrome: Secondary | ICD-10-CM | POA: Diagnosis not present

## 2023-02-12 NOTE — Progress Notes (Signed)
 Virtual Visit via Video Note  I connected with [name] on 02/12/23 at  8:00 AM EST by a video enabled telemedicine application and verified that I am speaking with the correct person using two identifiers.  Location: Patient: home Provider: home office   I discussed the limitations of evaluation and management by telemedicine and the availability of in person appointments. The patient expressed understanding and agreed to proceed.   I discussed the assessment and treatment plan with the patient. The patient was provided an opportunity to ask questions and all were answered. The patient agreed with the plan and demonstrated an understanding of the instructions.   The patient was advised to call back or seek an in-person evaluation if the symptoms worsen or if the condition fails to improve as anticipated.  I provided 45 minutes of non-face-to-face time during this encounter.  THERAPIST PROGRESS NOTE  Session Time: 8:00 AM to 8:45 AM  Participation Level: Active  Behavioral Response: CasualAlertappropriate  Type of Therapy: Individual Therapy  Treatment Goals addressed:  Patient shares mood improves through interaction and therapy, this happens through positive topics, through working through stressors  ProgressTowards Goals: Progressing-processing thoughts and feelings talk getting about different subjects help with mood and coping  Interventions: Solution Focused, Supportive, and Other: coping  Summary: [name] is a 63 y.o. female who presents with ok can't breath can't get air in nose thinks it is allergies. [name] is right beside her sleeping. He is mad he got his clipping and cleaning. He is good when it is being done. How she knows how mad the way behaving.  Face hurts sinus can feel it is like face bruised sinus and behind her eyes. Talked about watching things on TV for New Year's most times after an hour and half up and down all day and night. Not  watching anything special for New Year's was asleep. [name] sometimes keeps her up. Talked about our pets and their quirky behaviors that we remember.  Reading a series that has read many times there is some funny things by Oris Faden.SABRA Spear out a book to [place] help her cope as a young person. Thirteen Reasons Why Recommend to therapist. Patient says can feel things intensely therapist noted that helps to reach out to young people. Patient can remember sitting there and waiting for somebody to call. [name] father's can remember him stringing her along. Patient was 6 months pregnant and then he admitted he was not going to be with her. Paid his bills suckered her there too.What broke them up that he was screwing her best girlfriend. Friends for three years knew was he was baby's daddy.  Patient said she can remember thoughts of  suicide remembers the taste of gun on mouth therapist asked when?. When lost, Norleen, [name]'s dad. Later on realize the one to be upset with is him.      Therapist noted today session was nostalgic we talked about various topics such as pets we used to have an they are funny behaviors, how we were is teenagers and commonalities as young people that we experience things like crushes liking someone who does not return feelings.  The feeling of sharing these experiences provides connection and helpful.  Patient shared what other things such as rereading a series that she is read a bunch of times relating some of the things the dino says a really funny to her.  She reaches out to [place] with books that can help her  as she is a young person therapist reinforced helpful to do that that it can be lonely as a kid so helpful that people reach out to her and patient said her mom will talk to her about anything.  As we talked about experiences therapist pointed the small things can end up being the big things the day today things.  Talked about different areas of her life such as miracle her pet  and some of the things he is up to.  Assess helpful for patient to have an outlet to talk about areas of her life to process thoughts and feelings. Suicidal/Homicidal: No  Plan: Return again in 2 weeks.2.Work on pharmacologist for stressors process thoughts and feelings in session  Diagnosis: major depressive disorder, recurrent, moderate, degenerative disc disease, chronic pain syndrome, generalized anxiety disorder  Collaboration of Care: Other none needed  Patient/Guardian was advised Release of Information must be obtained prior to any record release in order to collaborate their care with an outside provider. Patient/Guardian was advised if they have not already done so to contact the registration department to sign all necessary forms in order for us  to release information regarding their care.   Consent: Patient/Guardian gives verbal consent for treatment and assignment of benefits for services provided during this visit. Patient/Guardian expressed understanding and agreed to proceed.   Ronal Sink, LCSW 02/12/2023

## 2023-02-26 ENCOUNTER — Encounter (HOSPITAL_COMMUNITY): Payer: Self-pay

## 2023-02-26 ENCOUNTER — Encounter (HOSPITAL_COMMUNITY): Payer: Self-pay | Admitting: Licensed Clinical Social Worker

## 2023-02-26 ENCOUNTER — Ambulatory Visit (INDEPENDENT_AMBULATORY_CARE_PROVIDER_SITE_OTHER): Payer: Self-pay | Admitting: Licensed Clinical Social Worker

## 2023-02-26 DIAGNOSIS — F411 Generalized anxiety disorder: Secondary | ICD-10-CM

## 2023-02-26 DIAGNOSIS — G894 Chronic pain syndrome: Secondary | ICD-10-CM

## 2023-02-26 DIAGNOSIS — F331 Major depressive disorder, recurrent, moderate: Secondary | ICD-10-CM

## 2023-02-26 NOTE — Progress Notes (Signed)
Patient did now show for appointment.

## 2023-03-07 ENCOUNTER — Encounter: Payer: Self-pay | Admitting: Physical Medicine & Rehabilitation

## 2023-03-07 ENCOUNTER — Encounter: Payer: Medicare Other | Attending: Physical Medicine and Rehabilitation | Admitting: Physical Medicine & Rehabilitation

## 2023-03-07 VITALS — BP 113/77 | HR 103 | Ht 68.5 in | Wt 250.0 lb

## 2023-03-07 DIAGNOSIS — G894 Chronic pain syndrome: Secondary | ICD-10-CM | POA: Diagnosis not present

## 2023-03-07 DIAGNOSIS — K5903 Drug induced constipation: Secondary | ICD-10-CM | POA: Diagnosis not present

## 2023-03-07 DIAGNOSIS — F329 Major depressive disorder, single episode, unspecified: Secondary | ICD-10-CM | POA: Diagnosis present

## 2023-03-07 DIAGNOSIS — T402X5A Adverse effect of other opioids, initial encounter: Secondary | ICD-10-CM | POA: Diagnosis not present

## 2023-03-07 DIAGNOSIS — M7918 Myalgia, other site: Secondary | ICD-10-CM | POA: Insufficient documentation

## 2023-03-07 DIAGNOSIS — M1711 Unilateral primary osteoarthritis, right knee: Secondary | ICD-10-CM | POA: Diagnosis not present

## 2023-03-07 MED ORDER — LIDOCAINE HCL 1 % IJ SOLN: 5.0000 mL | Freq: Once | INTRAMUSCULAR | Status: AC

## 2023-03-07 MED ORDER — CITALOPRAM HYDROBROMIDE 20 MG PO TABS: 20.0000 mg | ORAL_TABLET | Freq: Every day | ORAL | 7 refills | Status: AC

## 2023-03-07 MED ORDER — MORPHINE SULFATE ER 30 MG PO TBCR: 30.0000 mg | EXTENDED_RELEASE_TABLET | Freq: Two times a day (BID) | ORAL | 0 refills | Status: DC

## 2023-03-07 MED ORDER — MORPHINE SULFATE 15 MG PO TABS: 15.0000 mg | ORAL_TABLET | Freq: Four times a day (QID) | ORAL | 0 refills | Status: DC | PRN

## 2023-03-07 MED ORDER — MORPHINE SULFATE ER 60 MG PO TBCR
60.0000 mg | EXTENDED_RELEASE_TABLET | Freq: Two times a day (BID) | ORAL | 0 refills | Status: DC
Start: 2023-03-07 — End: 2023-03-22

## 2023-03-07 MED ORDER — LINACLOTIDE 145 MCG PO CAPS: 145.0000 ug | ORAL_CAPSULE | Freq: Every day | ORAL | 11 refills | Status: DC

## 2023-03-07 NOTE — Patient Instructions (Signed)
ALWAYS FEEL FREE TO CALL OUR OFFICE WITH ANY PROBLEMS OR QUESTIONS [phone])  **PLEASE NOTE** ALL MEDICATION REFILL REQUESTS (INCLUDING CONTROLLED SUBSTANCES) NEED TO BE MADE AT LEAST 7 DAYS PRIOR TO REFILL BEING DUE. ANY REFILL REQUESTS INSIDE THAT TIME FRAME MAY RESULT IN DELAYS IN RECEIVING YOUR PRESCRIPTION.

## 2023-03-07 NOTE — Progress Notes (Signed)
Procedure Note Dx: myofascial pain Trigger point injection:   After informed consent and preparation of the skin with isopropyl alcohol, I injected the the left lower rhomboid and left lower lumbar trigger points each with 2cc of 1% lidocaine. The patient tolerated well, and no complications were experienced. Post-injection instructions were provided.   Additionally, we adjusted celexa to 60mg  daily for reactive depression  Refilled morphine and linzess rx's also   Ranelle Oyster, MD, [facility] Physical Medicine & Rehabilitation Medical Director Rehabilitation Services 03/07/2023

## 2023-03-14 ENCOUNTER — Ambulatory Visit (HOSPITAL_COMMUNITY): Payer: Medicare Other | Admitting: Licensed Clinical Social Worker

## 2023-03-22 ENCOUNTER — Encounter: Payer: Medicare Other | Attending: Physical Medicine and Rehabilitation | Admitting: Registered Nurse

## 2023-03-22 ENCOUNTER — Encounter: Payer: Self-pay | Admitting: Registered Nurse

## 2023-03-22 VITALS — BP 96/67 | HR 81 | Ht 68.5 in | Wt 249.0 lb

## 2023-03-22 DIAGNOSIS — M1712 Unilateral primary osteoarthritis, left knee: Secondary | ICD-10-CM | POA: Diagnosis not present

## 2023-03-22 DIAGNOSIS — M7061 Trochanteric bursitis, right hip: Secondary | ICD-10-CM | POA: Diagnosis not present

## 2023-03-22 DIAGNOSIS — M25512 Pain in left shoulder: Secondary | ICD-10-CM | POA: Insufficient documentation

## 2023-03-22 DIAGNOSIS — M7062 Trochanteric bursitis, left hip: Secondary | ICD-10-CM | POA: Insufficient documentation

## 2023-03-22 DIAGNOSIS — M7918 Myalgia, other site: Secondary | ICD-10-CM

## 2023-03-22 DIAGNOSIS — G894 Chronic pain syndrome: Secondary | ICD-10-CM

## 2023-03-22 DIAGNOSIS — M546 Pain in thoracic spine: Secondary | ICD-10-CM | POA: Diagnosis not present

## 2023-03-22 DIAGNOSIS — G8929 Other chronic pain: Secondary | ICD-10-CM

## 2023-03-22 DIAGNOSIS — Z5181 Encounter for therapeutic drug level monitoring: Secondary | ICD-10-CM

## 2023-03-22 DIAGNOSIS — M1711 Unilateral primary osteoarthritis, right knee: Secondary | ICD-10-CM

## 2023-03-22 DIAGNOSIS — M47816 Spondylosis without myelopathy or radiculopathy, lumbar region: Secondary | ICD-10-CM | POA: Diagnosis not present

## 2023-03-22 DIAGNOSIS — M2550 Pain in unspecified joint: Secondary | ICD-10-CM | POA: Diagnosis not present

## 2023-03-22 DIAGNOSIS — F329 Major depressive disorder, single episode, unspecified: Secondary | ICD-10-CM

## 2023-03-22 DIAGNOSIS — Z79891 Long term (current) use of opiate analgesic: Secondary | ICD-10-CM

## 2023-03-22 MED ORDER — MORPHINE SULFATE 15 MG PO TABS: 15.0000 mg | ORAL_TABLET | Freq: Four times a day (QID) | ORAL | 0 refills | Status: DC | PRN

## 2023-03-22 MED ORDER — MORPHINE SULFATE ER 30 MG PO TBCR: 30.0000 mg | EXTENDED_RELEASE_TABLET | Freq: Two times a day (BID) | ORAL | 0 refills | Status: DC

## 2023-03-22 MED ORDER — MORPHINE SULFATE ER 60 MG PO TBCR: 60.0000 mg | EXTENDED_RELEASE_TABLET | Freq: Two times a day (BID) | ORAL | 0 refills | Status: DC

## 2023-03-22 NOTE — Progress Notes (Signed)
Subjective:    Patient ID: [name], female    DOB: 27-Sep-1960, 63 y.o.   MRN: [id number]  HPI: [name] is a 63 y.o. female who returns for follow up appointment for chronic pain and medication refill. She states her pain is located in her left shoulder, mid- back mainly left side, lower back, bilateral hips and bilateral knee pain R>L. She also reports generalized joint pain. She rates her pain 9. Her current exercise regime is walking and performing stretching exercises.  [name] Morphine equivalent is 180.00 MME.   Last UDS was Performed on 12/26/2022, it was consistent.    Pain Inventory Average Pain 8 Pain Right Now 9 My pain is stabbing  In the last 24 hours, has pain interfered with the following? General activity 8 Relation with others 0 Enjoyment of life 8 What TIME of day is your pain at its worst? varies Sleep (in general) Poor  Pain is worse with: walking and standing Pain improves with: heat/ice and medication Relief from Meds: 6  Family History  Problem Relation Age of Onset   Depression Mother    Hypertension Mother    Anxiety disorder Mother    Obesity Mother    Varicose Veins Mother    Hypertension Father    Heart failure Father    Diabetes Father    Heart attack Father    Arthritis Father    Cancer Father    Obesity Father    Colon cancer Neg Hx    Esophageal cancer Neg Hx    Stomach cancer Neg Hx    Rectal cancer Neg Hx    Social History   Socioeconomic History   Marital status: Married    Spouse name: Sullivan Lone   Number of children: 2   Years of education: some college   Highest education level: Some college, no degree  Occupational History   Occupation: On disability    Employer: UNEMPLOYED  Tobacco Use   Smoking status: Some Days    Current packs/day: 0.00    Types: Cigarettes    Last attempt to quit: 02/05/1973    Years since quitting: 50.1   Smokeless tobacco: Never  Vaping Use   Vaping status: Never  Used  Substance and Sexual Activity   Alcohol use: No   Drug use: No   Sexual activity: Not Currently    Partners: Male    Birth control/protection: None  Other Topics Concern   Not on file  Social History Narrative   On disability. Lives with her husband. She has one adopted daughter. She likes to read in her free time. She does chair exercises for 30-45 mins everyday.   Social Drivers of Health   Financial Resource Strain: Medium Risk (01/23/2023)   Overall Financial Resource Strain (CARDIA)    Difficulty of Paying Living Expenses: Somewhat hard  Food Insecurity: No Food Insecurity (01/22/2023)   Hunger Vital Sign    Worried About Running Out of Food in the Last Year: Never true    Ran Out of Food in the Last Year: Never true  Transportation Needs: No Transportation Needs (01/23/2023)   PRAPARE - Administrator, Civil Service (Medical): No    Lack of Transportation (Non-Medical): No  Physical Activity: Unknown (01/22/2023)   Exercise Vital Sign    Days of Exercise per Week: 0 days    Minutes of Exercise per Session: Not on file  Stress: Stress Concern Present (01/23/2023)   [employer] of  Occupational Health - Occupational Stress Questionnaire    Feeling of Stress : To some extent  Social Connections: Unknown (01/23/2023)   Social Connection and Isolation Panel [NHANES]    Frequency of Communication with Friends and Family: Patient declined    Frequency of Social Gatherings with Friends and Family: Patient declined    Attends Religious Services: Never    Database administrator or Organizations: No    Attends Engineer, structural: Never    Marital Status: Married   Past Surgical History:  Procedure Laterality Date   ABDOMINAL HYSTERECTOMY  06/2002   Basel Cell Carcinoma  06/2005, 07/2005   nasal tip and reconstruction   BREAST CYST ASPIRATION     CARPAL TUNNEL RELEASE  07/1993   both hands   COLONOSCOPY     COSMETIC SURGERY  Basal cell  carcinoma of nose   HEMORROIDECTOMY  08/2001   JOINT REPLACEMENT  Left knee replacement   KNEE ARTHROPLASTY  09/2001, 05/2003   left knee, chondromalasia   KNEE ARTHROSCOPY  02/2003   Right knee, tisse release, chrondromalacia   REPLACEMENT TOTAL KNEE  12/2003   Left    THYROID LOBECTOMY  01/2005   malignant area removed from right lobe   THYROID SURGERY     thyroid lobe removal   TONSILLECTOMY  09/1972   TUBAL LIGATION  10/1997   Ulnar nerve entrapment  03/1993   left elbow   Past Surgical History:  Procedure Laterality Date   ABDOMINAL HYSTERECTOMY  06/2002   Basel Cell Carcinoma  06/2005, 07/2005   nasal tip and reconstruction   BREAST CYST ASPIRATION     CARPAL TUNNEL RELEASE  07/1993   both hands   COLONOSCOPY     COSMETIC SURGERY  Basal cell carcinoma of nose   HEMORROIDECTOMY  08/2001   JOINT REPLACEMENT  Left knee replacement   KNEE ARTHROPLASTY  09/2001, 05/2003   left knee, chondromalasia   KNEE ARTHROSCOPY  02/2003   Right knee, tisse release, chrondromalacia   REPLACEMENT TOTAL KNEE  12/2003   Left    THYROID LOBECTOMY  01/2005   malignant area removed from right lobe   THYROID SURGERY     thyroid lobe removal   TONSILLECTOMY  09/1972   TUBAL LIGATION  10/1997   Ulnar nerve entrapment  03/1993   left elbow   Past Medical History:  Diagnosis Date   Allergy    Anxiety    Cancer (HCC)    cancerous nodules on thyroid   Cancer (HCC)    basal cell Cancer-nose   Chronic pain syndrome    COPD (chronic obstructive pulmonary disease) (HCC)    Degeneration of lumbar or lumbosacral intervertebral disc    Depression    Facet syndrome, lumbar    GERD (gastroesophageal reflux disease)    Hyperlipidemia    Intervertebral lumbar disc disorder with myelopathy, lumbar region    Lumbosacral spondylosis without myelopathy    Migraine without aura, with intractable migraine, so stated, without mention of status migrainosus    Primary localized osteoarthrosis, lower leg     Thyroid disease    hypothyroid   Unspecified musculoskeletal disorders and symptoms referable to neck    cervical/trapezius   BP 96/67   Pulse 81   Ht 5' 8.5" (1.74 m)   Wt 249 lb (112.9 kg)   SpO2 98%   BMI 37.31 kg/m   Opioid Risk Score:  Fall Risk Score: 1  Depression screen [facility] 2/9  01/23/2023    3:08 PM 11/28/2022    8:24 AM 09/27/2022    8:25 AM 08/09/2022    8:44 AM 06/14/2022    9:23 AM 05/09/2022    8:21 AM 04/07/2022    8:52 AM  Depression screen PHQ 2/9  Decreased Interest 1 1 1 3 3 1 1   Down, Depressed, Hopeless 1 1 1 3 3 1 1   PHQ - 2 Score 2 2 2 6 6 2 2   Altered sleeping 3        Tired, decreased energy 3        Change in appetite 1        Feeling bad or failure about yourself  1        Trouble concentrating 0        Moving slowly or fidgety/restless 0        Suicidal thoughts 0        PHQ-9 Score 10        Difficult doing work/chores Somewhat difficult          Review of Systems  Musculoskeletal:  Positive for back pain and gait problem.       Left shoulder pain Bilateral hip pain Bilateral knee pain  All other systems reviewed and are negative.      Objective:   Physical Exam Vitals and nursing note reviewed.  Constitutional:      Appearance: Normal appearance.  Cardiovascular:     Rate and Rhythm: Normal rate and regular rhythm.     Pulses: Normal pulses.     Heart sounds: Normal heart sounds.  Pulmonary:     Effort: Pulmonary effort is normal.     Breath sounds: Normal breath sounds.  Musculoskeletal:     Comments: Normal Muscle Bulk and Muscle Testing Reveals:  Upper Extremities: Full ROM and Muscle Strength 5/5  Thoracic Paraspinal Tenderness: T-2-T-6 Mainly Left Side  Lumbar Paraspinal Tenderness: L-3-L-5 Bilateral Greater Trochanter Tenderness Lower Extremities: Decreased ROM and Muscle Strength 5/5 Bilateral Lower Extremities Flexion Produces Pain into her Bilateral Patella's Arises from Table slowly using cane for  support Antalgic  Gait     Skin:    General: Skin is warm and dry.  Neurological:     Mental Status: She is alert and oriented to person, place, and time.  Psychiatric:        Mood and Affect: Mood normal.        Behavior: Behavior normal.         Assessment & Plan:  1.Chronic Bilateral Thoracic Back Pain:  Continue HEP as Tolerated. Continue to Monitor. 03/22/2023 2.  Low Back Pain/ Lumbar Facet Arthropathy: Refilled: MSIR 15 mg one tablet every 6 hours as needed #100 and   Refilled: MS Contin 60 mg and MS Contin  30 mg Q 12 hours to equal   90 mg  #60/60.Marland Kitchen Second script sent to pharmacy to accommodate to scheduled appointment.  Continue Gabapentin and Pamelor. 01/25/2023 We will continue the opioid monitoring program, this consists of regular clinic visits, examinations, urine drug screen, pill counts as well as use of [place] Controlled Substance Reporting system. A 12 month History has been reviewed on the [place] Controlled Substance Reporting System on 03/22/2023. 3. Degenerative Disk Disease:Encouraged Continue HEP as Tolertated  and heat therapy. Continue Current Medication Regime.03/22/2023 4. Bilateral Greater Trochanteric Bursitis:  Continue current treatment regimen with Heat and Ice Therapy. 03/22/2023 5. Osteoarthritis of Bilateral Knees: R>L Continue current treatment modality with home exercise  program and heat therapy. 03/22/2023 6. Migraine Headaches: Continue current medication regimen  Continue with  Migraine Journal. Aimovig discontinued due to Hives. Continue Lamictal and Relpax. Continue to Monitor.  03/22/2023 7. Smoking Cessation: She has quit smoking since 08/25/2016. Continue to Monitor. 03/22/2023 8.Constipation: Continue  Current medication regime with Linzess. 03/22/2023 9. Muscle Spasm: S/P Trigger Point Injection with Dr Riley Kill.Continue current medication regime with Tizanidine.03/22/2023 10. DepressionAnxiety/ Panic Attacks: Continue current  medication regimen and treatment modality.  Celexa.Lamictal  and Counseling with [place] Breeze and  Dr. Gilmore Laroche Psychiatrist. 03/22/2023 11. RSD: Continue with current medication Regime with Gabapentin. 03/22/2023 12. DeQuervain's Tenosynovitis: No complaints today. Contiue to Monitor.    F/U in 1 months

## 2023-03-26 ENCOUNTER — Telehealth: Payer: Self-pay

## 2023-03-26 NOTE — Telephone Encounter (Signed)
 Note from pharmacy "Please be aware, patient is taking 28 tabs due to low stock-did not want to wait for full amount on Monday, forfeits remainder."

## 2023-03-27 ENCOUNTER — Encounter: Payer: Self-pay | Admitting: Family Medicine

## 2023-03-27 ENCOUNTER — Ambulatory Visit (INDEPENDENT_AMBULATORY_CARE_PROVIDER_SITE_OTHER): Payer: Medicare Other | Admitting: Family Medicine

## 2023-03-27 VITALS — BP 137/69 | HR 95 | Ht 68.5 in | Wt 249.0 lb

## 2023-03-27 DIAGNOSIS — R221 Localized swelling, mass and lump, neck: Secondary | ICD-10-CM

## 2023-03-27 DIAGNOSIS — Z23 Encounter for immunization: Secondary | ICD-10-CM

## 2023-03-27 DIAGNOSIS — M51369 Other intervertebral disc degeneration, lumbar region without mention of lumbar back pain or lower extremity pain: Secondary | ICD-10-CM

## 2023-03-27 DIAGNOSIS — M7918 Myalgia, other site: Secondary | ICD-10-CM | POA: Diagnosis not present

## 2023-03-27 DIAGNOSIS — E1142 Type 2 diabetes mellitus with diabetic polyneuropathy: Secondary | ICD-10-CM | POA: Diagnosis not present

## 2023-03-27 DIAGNOSIS — B351 Tinea unguium: Secondary | ICD-10-CM | POA: Diagnosis not present

## 2023-03-27 DIAGNOSIS — N3281 Overactive bladder: Secondary | ICD-10-CM | POA: Diagnosis not present

## 2023-03-27 DIAGNOSIS — L918 Other hypertrophic disorders of the skin: Secondary | ICD-10-CM

## 2023-03-27 LAB — POCT UA - MICROALBUMIN
Albumin/Creatinine Ratio, Urine, POC: <30
Creatinine, POC: 100 mg/dL
Microalbumin Ur, POC: 30 mg/L

## 2023-03-27 LAB — POCT GLYCOSYLATED HEMOGLOBIN (HGB A1C): Hemoglobin A1C: 7 % — AB (ref 4.0–5.6)

## 2023-03-27 MED ORDER — PRAVASTATIN SODIUM 40 MG PO TABS: 40.0000 mg | ORAL_TABLET | Freq: Every day | ORAL | 2 refills | Status: DC

## 2023-03-27 MED ORDER — AMBULATORY NON FORMULARY MEDICATION: 0 refills | Status: AC

## 2023-03-27 MED ORDER — TOLTERODINE TARTRATE ER 4 MG PO CP24: ORAL_CAPSULE | ORAL | 3 refills | Status: AC

## 2023-03-27 MED ORDER — TERBINAFINE HCL 250 MG PO TABS: 250.0000 mg | ORAL_TABLET | Freq: Every day | ORAL | 1 refills | Status: DC

## 2023-03-27 NOTE — Progress Notes (Signed)
 Established Patient Office Visit  Subjective  Patient ID: [name], female    DOB: [birth year]  Age: 63 y.o. MRN: [id number]  Chief Complaint  Patient presents with   Diabetes    HPI  Last time I saw her her A1C was uncontrolled and she had been out of her meds for almost a month. She is doing well on medications.   Woulld like to have 2 skin tags removed from the left side of neck.   Says topical are not helping her toes that are getting more thick and difficult to trim: both great nails and 4th nails on each foot.    C/O lumps on both sides of jaw at neckline. Says getting larger esp on right. She thinks they mah be lipomas.   ROS    Objective:     BP 137/69   Pulse 95   Ht 5' 8.5" (1.74 m)   Wt 249 lb (112.9 kg)   SpO2 95%   BMI 37.31 kg/m     Physical Exam Vitals and nursing note reviewed.  Constitutional:      Appearance: Normal appearance.  HENT:     Head: Normocephalic and atraumatic.  Eyes:     Conjunctiva/sclera: Conjunctivae normal.  Cardiovascular:     Rate and Rhythm: Normal rate and regular rhythm.  Pulmonary:     Effort: Pulmonary effort is normal.     Breath sounds: Normal breath sounds.  Skin:    General: Skin is warm and dry.     Comments: Palpable soft smooth lump st the corner of jaw and ear.  Several skin tags arend the neck but she has 2 in particular she would like removed.    Neurological:     Mental Status: She is alert.  Psychiatric:        Mood and Affect: Mood normal.      Results for orders placed or performed in visit on 03/27/23  POCT HgB A1C  Result Value Ref Range   Hemoglobin A1C 7.0 (A) 4.0 - 5.6 %   HbA1c POC (<> result, manual entry)     HbA1c, POC (prediabetic range)     HbA1c, POC (controlled diabetic range)    POCT UA - Microalbumin  Result Value Ref Range   Microalbumin Ur, POC 30 mg/L   Creatinine, POC 100 mg/dL   Albumin/Creatinine Ratio, Urine, POC <30        The ASCVD Risk score (Arnett  DK, et al., 2019) failed to calculate for the following reasons:   Cannot find a previous HDL lab   Cannot find a previous total cholesterol lab    Assessment & Plan:   Problem List Items Addressed This Visit       Endocrine   Diabetes type 2, controlled (HCC) - Primary   A1c looks better today at 7.0.  Great work and tolerating oral agent well without any side effects or problems continue to work on Therapist, music.  Follow-up in 3 months.      Relevant Medications   pravastatin (PRAVACHOL) 40 MG tablet   Other Relevant Orders   POCT HgB A1C (Completed)   POCT UA - Microalbumin (Completed)     Musculoskeletal and Integument   Onychomycosis   No good response with topical so will change to oral lamisil. Recheck liver in one months bc she is also on narcotics. If not improvement after 4-6 month will refer to Podiatry for further workup.       Relevant  Medications   terbinafine (LAMISIL) 250 MG tablet   Degenerative disc disease, lumbar   Relevant Medications   AMBULATORY NON FORMULARY MEDICATION     Genitourinary   OAB (overactive bladder)   Needs refill on the Detrol LA      Relevant Medications   tolterodine (DETROL LA) 4 MG 24 hr capsule     Other   Myofascial muscle pain   Follows with pian management.       Other Visit Diagnoses       Encounter for immunization       Relevant Orders   Varicella-zoster vaccine IM (Completed)     Skin tag         Neck mass       Relevant Orders   CT Soft Tissue Neck W Contrast      Eval bilateral at edge of jawline near the ear for mass vs lipoma vs salivary gland mass, etc.  Skin Tag Removal Procedure Note Diagnosis: inflamed skin tags Location: neck Informed Consent: Discussed risks (permanent scarring, infection, pain, bleeding, bruising, redness, and recurrence of the lesion) and benefits of the procedure, as well as the alternatives. She is aware that skin tags are benign lesions, and their removal is often not  considered medically necessary. Informed consent was obtained. Preparation: The area was prepared in a standard fashion. Anesthesia: not required Procedure Details: Iris scissors were used to perform sharp removal. Aluminum chloride was applied for hemostasis. Ointment and bandage were applied where needed. The patient tolerated the procedure well. Total number of lesions treated: 2 Plan: The patient was instructed on post-op care. Recommend OTC analgesia as needed for pain.  Return in about 3 months (around 06/24/2023) for Diabetes follow-up.    [name], MD

## 2023-03-27 NOTE — Assessment & Plan Note (Signed)
 Needs refill on the Detrol LA

## 2023-03-27 NOTE — Assessment & Plan Note (Signed)
 A1c looks better today at 7.0.  Great work and tolerating oral agent well without any side effects or problems continue to work on Therapist, music.  Follow-up in 3 months.

## 2023-03-27 NOTE — Assessment & Plan Note (Signed)
 Follows with pian management.

## 2023-03-27 NOTE — Assessment & Plan Note (Signed)
 No good response with topical so will change to oral lamisil. Recheck liver in one months bc she is also on narcotics. If not improvement after 4-6 month will refer to Podiatry for further workup.

## 2023-03-28 ENCOUNTER — Ambulatory Visit (HOSPITAL_COMMUNITY): Payer: Medicare Other | Admitting: Licensed Clinical Social Worker

## 2023-04-02 ENCOUNTER — Telehealth: Payer: Self-pay | Admitting: Registered Nurse

## 2023-04-02 ENCOUNTER — Other Ambulatory Visit: Payer: Self-pay | Admitting: *Deleted

## 2023-04-02 DIAGNOSIS — G894 Chronic pain syndrome: Secondary | ICD-10-CM

## 2023-04-02 DIAGNOSIS — M1711 Unilateral primary osteoarthritis, right knee: Secondary | ICD-10-CM

## 2023-04-02 DIAGNOSIS — E1165 Type 2 diabetes mellitus with hyperglycemia: Secondary | ICD-10-CM

## 2023-04-02 MED ORDER — DAPAGLIFLOZIN PRO-METFORMIN ER 10-1000 MG PO TB24: 1.0000 | ORAL_TABLET | Freq: Every day | ORAL | 6 refills | Status: DC

## 2023-04-02 MED ORDER — MORPHINE SULFATE ER 30 MG PO TBCR: 30.0000 mg | EXTENDED_RELEASE_TABLET | Freq: Two times a day (BID) | ORAL | 0 refills | Status: DC

## 2023-04-02 NOTE — Telephone Encounter (Signed)
 PMP was Reviewed.  MS Contin e-scribed to pharmacy.  Ms. Beedy is aware via My-Chart.

## 2023-04-09 ENCOUNTER — Other Ambulatory Visit: Payer: Medicare Other

## 2023-04-11 ENCOUNTER — Ambulatory Visit (HOSPITAL_COMMUNITY): Payer: Medicare Other | Admitting: Licensed Clinical Social Worker

## 2023-04-12 ENCOUNTER — Other Ambulatory Visit

## 2023-04-12 ENCOUNTER — Other Ambulatory Visit: Payer: Self-pay | Admitting: Physical Medicine & Rehabilitation

## 2023-04-17 NOTE — Progress Notes (Unsigned)
 Subjective:    Patient ID: [name], female    DOB: February 24, 1960, 63 y.o.   MRN: [id number]  HPI: [name] is a 62 y.o. female who returns for follow up appointment for chronic pain and medication refill. She states her pain is located in her lower back, bilateral hips and bilateral knee pain R>L. She rates her pain 8. Her current exercise regime is walking and performing stretching exercises.  [name] Morphine equivalent is 220.00 MME.   Oral Swab was Performed today.    Pain Inventory Average Pain 8 Pain Right Now 8 My pain is constant, sharp, burning, dull, stabbing, tingling, and aching  In the last 24 hours, has pain interfered with the following? General activity 8 Relation with others 0 Enjoyment of life 8 What TIME of day is your pain at its worst? morning , daytime, evening, and night Sleep (in general) Poor  Pain is worse with: walking, bending, sitting, standing, and some activites Pain improves with: rest, heat/ice, and medication Relief from Meds: 6  Family History  Problem Relation Age of Onset   Depression Mother    Hypertension Mother    Anxiety disorder Mother    Obesity Mother    Varicose Veins Mother    Hypertension Father    Heart failure Father    Diabetes Father    Heart attack Father    Arthritis Father    Cancer Father    Obesity Father    Colon cancer Neg Hx    Esophageal cancer Neg Hx    Stomach cancer Neg Hx    Rectal cancer Neg Hx    Social History   Socioeconomic History   Marital status: Married    Spouse name: Sullivan Lone   Number of children: 2   Years of education: some college   Highest education level: Some college, no degree  Occupational History   Occupation: On disability    Employer: UNEMPLOYED  Tobacco Use   Smoking status: Some Days    Current packs/day: 0.00    Types: Cigarettes    Last attempt to quit: 02/05/1973    Years since quitting: 50.2   Smokeless tobacco: Never  Vaping Use    Vaping status: Never Used  Substance and Sexual Activity   Alcohol use: No   Drug use: No   Sexual activity: Not Currently    Partners: Male    Birth control/protection: None  Other Topics Concern   Not on file  Social History Narrative   On disability. Lives with her husband. She has one adopted daughter. She likes to read in her free time. She does chair exercises for 30-45 mins everyday.   Social Drivers of Health   Financial Resource Strain: Medium Risk (01/23/2023)   Overall Financial Resource Strain (CARDIA)    Difficulty of Paying Living Expenses: Somewhat hard  Food Insecurity: No Food Insecurity (01/22/2023)   Hunger Vital Sign    Worried About Running Out of Food in the Last Year: Never true    Ran Out of Food in the Last Year: Never true  Transportation Needs: No Transportation Needs (01/23/2023)   PRAPARE - Administrator, Civil Service (Medical): No    Lack of Transportation (Non-Medical): No  Physical Activity: Unknown (01/22/2023)   Exercise Vital Sign    Days of Exercise per Week: 0 days    Minutes of Exercise per Session: Not on file  Stress: Stress Concern Present (01/23/2023)   [employer] of  Occupational Health - Occupational Stress Questionnaire    Feeling of Stress : To some extent  Social Connections: Unknown (01/23/2023)   Social Connection and Isolation Panel [NHANES]    Frequency of Communication with Friends and Family: Patient declined    Frequency of Social Gatherings with Friends and Family: Patient declined    Attends Religious Services: Never    Database administrator or Organizations: No    Attends Engineer, structural: Never    Marital Status: Married   Past Surgical History:  Procedure Laterality Date   ABDOMINAL HYSTERECTOMY  06/2002   Basel Cell Carcinoma  06/2005, 07/2005   nasal tip and reconstruction   BREAST CYST ASPIRATION     CARPAL TUNNEL RELEASE  07/1993   both hands   COLONOSCOPY     COSMETIC  SURGERY  Basal cell carcinoma of nose   HEMORROIDECTOMY  08/2001   JOINT REPLACEMENT  Left knee replacement   KNEE ARTHROPLASTY  09/2001, 05/2003   left knee, chondromalasia   KNEE ARTHROSCOPY  02/2003   Right knee, tisse release, chrondromalacia   REPLACEMENT TOTAL KNEE  12/2003   Left    THYROID LOBECTOMY  01/2005   malignant area removed from right lobe   THYROID SURGERY     thyroid lobe removal   TONSILLECTOMY  09/1972   TUBAL LIGATION  10/1997   Ulnar nerve entrapment  03/1993   left elbow   Past Surgical History:  Procedure Laterality Date   ABDOMINAL HYSTERECTOMY  06/2002   Basel Cell Carcinoma  06/2005, 07/2005   nasal tip and reconstruction   BREAST CYST ASPIRATION     CARPAL TUNNEL RELEASE  07/1993   both hands   COLONOSCOPY     COSMETIC SURGERY  Basal cell carcinoma of nose   HEMORROIDECTOMY  08/2001   JOINT REPLACEMENT  Left knee replacement   KNEE ARTHROPLASTY  09/2001, 05/2003   left knee, chondromalasia   KNEE ARTHROSCOPY  02/2003   Right knee, tisse release, chrondromalacia   REPLACEMENT TOTAL KNEE  12/2003   Left    THYROID LOBECTOMY  01/2005   malignant area removed from right lobe   THYROID SURGERY     thyroid lobe removal   TONSILLECTOMY  09/1972   TUBAL LIGATION  10/1997   Ulnar nerve entrapment  03/1993   left elbow   Past Medical History:  Diagnosis Date   Allergy    Anxiety    Cancer (HCC)    cancerous nodules on thyroid   Cancer (HCC)    basal cell Cancer-nose   Chronic pain syndrome    COPD (chronic obstructive pulmonary disease) (HCC)    Degeneration of lumbar or lumbosacral intervertebral disc    Depression    Facet syndrome, lumbar    GERD (gastroesophageal reflux disease)    Hyperlipidemia    Intervertebral lumbar disc disorder with myelopathy, lumbar region    Lumbosacral spondylosis without myelopathy    Migraine without aura, with intractable migraine, so stated, without mention of status migrainosus    Primary localized  osteoarthrosis, lower leg    Thyroid disease    hypothyroid   Unspecified musculoskeletal disorders and symptoms referable to neck    cervical/trapezius   BP 115/78   Pulse 96   Ht 5' 8.5" (1.74 m)   SpO2 90%   BMI 37.31 kg/m   Opioid Risk Score:   Fall Risk Score:  `1  Depression screen [facility] 2/9     04/18/2023  8:57 AM 03/27/2023   10:46 AM 01/23/2023    3:08 PM 11/28/2022    8:24 AM 09/27/2022    8:25 AM 08/09/2022    8:44 AM 06/14/2022    9:23 AM  Depression screen PHQ 2/9  Decreased Interest 1 3 1 1 1 3 3   Down, Depressed, Hopeless 1 3 1 1 1 3 3   PHQ - 2 Score 2 6 2 2 2 6 6   Altered sleeping  3 3      Tired, decreased energy  3 3      Change in appetite  2 1      Feeling bad or failure about yourself   2 1      Trouble concentrating  3 0      Moving slowly or fidgety/restless  0 0      Suicidal thoughts  1 0      PHQ-9 Score  20 10      Difficult doing work/chores  Somewhat difficult Somewhat difficult        Review of Systems  All other systems reviewed and are negative.      Objective:   Physical Exam Vitals and nursing note reviewed.  Constitutional:      Appearance: Normal appearance.  Cardiovascular:     Rate and Rhythm: Normal rate and regular rhythm.     Pulses: Normal pulses.     Heart sounds: Normal heart sounds.  Pulmonary:     Effort: Pulmonary effort is normal.     Breath sounds: Normal breath sounds.  Musculoskeletal:     Comments: Normal Muscle Bulk and Muscle Testing Reveals:  Upper Extremities: Full ROM and Muscle Strength 5/5  Lumbar Paraspinal Tenderness: L-3-L-5 Bilateral Greater Trochanter Tenderness Lower Extremities: Decreased ROM and Muscle Strength 5/5 Bilateral Lower Extremities Flexion Produces Pain into her Bilateral Patella's Arises from Table slowly using cane for support Antalgic Gait     Skin:    General: Skin is warm and dry.  Neurological:     Mental Status: She is alert and oriented to person, place, and time.   Psychiatric:        Mood and Affect: Mood normal.        Behavior: Behavior normal.         Assessment & Plan:  1.Chronic Bilateral Thoracic Back Pain:  No complaints today. Continue HEP as Tolerated. Continue to Monitor. 04/18/2023 2.  Low Back Pain/ Lumbar Facet Arthropathy: Refilled: MSIR 15 mg one tablet every 6 hours as needed #100 and   Refilled: MS Contin 60 mg and MS Contin  30 mg Q 12 hours to equal   90 mg  #60/60.Marland Kitchen Second script sent to pharmacy to accommodate to scheduled appointment.  Continue Gabapentin and Pamelor. 04/18/2023 We will continue the opioid monitoring program, this consists of regular clinic visits, examinations, urine drug screen, pill counts as well as use of [place] Controlled Substance Reporting system. A 12 month History has been reviewed on the [place] Controlled Substance Reporting System on 04/18/2023. 3. Degenerative Disk Disease:Encouraged Continue HEP as Tolertated  and heat therapy. Continue Current Medication Regime.04/18/2023 4. Bilateral Greater Trochanteric Bursitis:  Continue current treatment regimen with Heat and Ice Therapy. 04/18/2023 5. Osteoarthritis of Bilateral Knees: R>L Continue current treatment modality with home exercise program and heat therapy. 04/17/2023 6. Migraine Headaches: Continue current medication regimen  Continue with  Migraine Journal. Aimovig discontinued due to Hives. Continue Lamictal and Relpax. Continue to Monitor.  04/18/2023 7. Smoking Cessation:  She has quit smoking since 08/25/2016. Continue to Monitor. 04/18/2023 8.Constipation: Continue  Current medication regime with Linzess. 04/18/2023 9. Muscle Spasm: S/P Trigger Point Injection with Dr Riley Kill on 03/07/2023.Continue current medication regime with Tizanidine.04/18/2023 10. DepressionAnxiety/ Panic Attacks: Continue current medication regimen and treatment modality.  Celexa.Lamictal  and Counseling with [place] Breeze and  Dr. Gilmore Laroche Psychiatrist.  04/18/2023 11. RSD: Continue with current medication Regime with Gabapentin. 04/18/2023 12. DeQuervain's Tenosynovitis: No complaints today. Contiue to Monitor. 04/18/2023   F/U in 1 months

## 2023-04-18 ENCOUNTER — Encounter: Payer: Self-pay | Admitting: Registered Nurse

## 2023-04-18 ENCOUNTER — Encounter: Payer: Medicare Other | Attending: Physical Medicine and Rehabilitation | Admitting: Registered Nurse

## 2023-04-18 VITALS — BP 115/78 | HR 96 | Ht 68.5 in

## 2023-04-18 DIAGNOSIS — G894 Chronic pain syndrome: Secondary | ICD-10-CM | POA: Diagnosis not present

## 2023-04-18 DIAGNOSIS — M47816 Spondylosis without myelopathy or radiculopathy, lumbar region: Secondary | ICD-10-CM | POA: Insufficient documentation

## 2023-04-18 DIAGNOSIS — M7062 Trochanteric bursitis, left hip: Secondary | ICD-10-CM | POA: Diagnosis not present

## 2023-04-18 DIAGNOSIS — Z79891 Long term (current) use of opiate analgesic: Secondary | ICD-10-CM | POA: Diagnosis not present

## 2023-04-18 DIAGNOSIS — Z5181 Encounter for therapeutic drug level monitoring: Secondary | ICD-10-CM | POA: Diagnosis not present

## 2023-04-18 DIAGNOSIS — M1711 Unilateral primary osteoarthritis, right knee: Secondary | ICD-10-CM | POA: Insufficient documentation

## 2023-04-18 DIAGNOSIS — M1712 Unilateral primary osteoarthritis, left knee: Secondary | ICD-10-CM | POA: Insufficient documentation

## 2023-04-18 DIAGNOSIS — M7061 Trochanteric bursitis, right hip: Secondary | ICD-10-CM | POA: Insufficient documentation

## 2023-04-18 MED ORDER — MORPHINE SULFATE ER 30 MG PO TBCR: 30.0000 mg | EXTENDED_RELEASE_TABLET | Freq: Two times a day (BID) | ORAL | 0 refills | Status: DC

## 2023-04-18 MED ORDER — MORPHINE SULFATE ER 60 MG PO TBCR
60.0000 mg | EXTENDED_RELEASE_TABLET | Freq: Two times a day (BID) | ORAL | 0 refills | Status: DC
Start: 2023-04-18 — End: 2023-05-15

## 2023-04-18 MED ORDER — MORPHINE SULFATE 15 MG PO TABS: 15.0000 mg | ORAL_TABLET | Freq: Four times a day (QID) | ORAL | 0 refills | Status: DC | PRN

## 2023-04-21 ENCOUNTER — Other Ambulatory Visit: Payer: Self-pay | Admitting: Family Medicine

## 2023-04-21 DIAGNOSIS — E039 Hypothyroidism, unspecified: Secondary | ICD-10-CM

## 2023-04-23 ENCOUNTER — Other Ambulatory Visit

## 2023-04-25 ENCOUNTER — Ambulatory Visit: Payer: Medicare Other

## 2023-04-25 LAB — DRUG TOX MONITOR 1 W/CONF, ORAL FLD
Amphetamines: NEGATIVE ng/mL (ref <10)
Barbiturates: NEGATIVE ng/mL (ref <10)
Benzodiazepines: NEGATIVE ng/mL (ref <0.50)
Buprenorphine: NEGATIVE ng/mL (ref <0.10)
Cocaine: NEGATIVE ng/mL (ref <5.0)
Codeine: NEGATIVE ng/mL (ref <2.5)
Cotinine: 105.5 ng/mL — ABNORMAL HIGH (ref <5.0)
Dihydrocodeine: NEGATIVE ng/mL (ref <2.5)
Fentanyl: NEGATIVE ng/mL (ref <0.10)
Heroin Metabolite: NEGATIVE ng/mL (ref <1.0)
Hydrocodone: NEGATIVE ng/mL (ref <2.5)
Hydromorphone: NEGATIVE ng/mL (ref <2.5)
MARIJUANA: NEGATIVE ng/mL (ref <2.5)
MDMA: NEGATIVE ng/mL (ref <10)
Meprobamate: NEGATIVE ng/mL (ref <2.5)
Methadone: NEGATIVE ng/mL (ref <5.0)
Morphine: 29.7 ng/mL — ABNORMAL HIGH (ref <2.5)
Nicotine Metabolite: POSITIVE ng/mL — AB (ref <5.0)
Norhydrocodone: NEGATIVE ng/mL (ref <2.5)
Noroxycodone: NEGATIVE ng/mL (ref <2.5)
Opiates: POSITIVE ng/mL — AB (ref <2.5)
Oxycodone: NEGATIVE ng/mL (ref <2.5)
Oxymorphone: NEGATIVE ng/mL (ref <2.5)
Phencyclidine: NEGATIVE ng/mL (ref <10)
Tapentadol: NEGATIVE ng/mL (ref <5.0)
Tramadol: NEGATIVE ng/mL (ref <5.0)
Zolpidem: NEGATIVE ng/mL (ref <5.0)

## 2023-04-25 LAB — DRUG TOX ALC METAB W/CON, ORAL FLD: Alcohol Metabolite: NEGATIVE ng/mL (ref <25)

## 2023-04-30 ENCOUNTER — Other Ambulatory Visit

## 2023-05-01 ENCOUNTER — Other Ambulatory Visit: Payer: Self-pay | Admitting: Physical Medicine & Rehabilitation

## 2023-05-02 ENCOUNTER — Other Ambulatory Visit: Payer: Self-pay | Admitting: *Deleted

## 2023-05-02 MED ORDER — LAMOTRIGINE 25 MG PO TABS: 25.0000 mg | ORAL_TABLET | Freq: Two times a day (BID) | ORAL | 2 refills | Status: DC

## 2023-05-03 ENCOUNTER — Encounter: Payer: Self-pay | Admitting: Physical Medicine & Rehabilitation

## 2023-05-10 ENCOUNTER — Ambulatory Visit

## 2023-05-10 ENCOUNTER — Other Ambulatory Visit: Payer: Self-pay | Admitting: Family Medicine

## 2023-05-10 DIAGNOSIS — Z1231 Encounter for screening mammogram for malignant neoplasm of breast: Secondary | ICD-10-CM

## 2023-05-11 MED ORDER — LAMOTRIGINE 25 MG PO TABS: 50.0000 mg | ORAL_TABLET | Freq: Two times a day (BID) | ORAL | 4 refills | Status: DC

## 2023-05-11 NOTE — Telephone Encounter (Signed)
 We increased lamictal to 50mg . New rx sent to pharmacy

## 2023-05-12 ENCOUNTER — Other Ambulatory Visit: Payer: Self-pay | Admitting: Physical Medicine & Rehabilitation

## 2023-05-14 ENCOUNTER — Ambulatory Visit

## 2023-05-14 DIAGNOSIS — R221 Localized swelling, mass and lump, neck: Secondary | ICD-10-CM | POA: Diagnosis not present

## 2023-05-14 LAB — I-STAT CREATININE (MANUAL ENTRY): Creatinine, Ser: 0.9 (ref 0.50–1.10)

## 2023-05-14 MED ORDER — IOHEXOL 300 MG/ML  SOLN
100.0000 mL | Freq: Once | INTRAMUSCULAR | Status: AC | PRN
  Administered 2023-05-14: 75 mL via INTRAVENOUS

## 2023-05-15 ENCOUNTER — Encounter: Payer: Self-pay | Admitting: Registered Nurse

## 2023-05-15 ENCOUNTER — Encounter: Attending: Physical Medicine and Rehabilitation | Admitting: Registered Nurse

## 2023-05-15 VITALS — BP 130/85 | HR 89 | Ht 68.5 in | Wt 248.0 lb

## 2023-05-15 DIAGNOSIS — Z5181 Encounter for therapeutic drug level monitoring: Secondary | ICD-10-CM | POA: Diagnosis not present

## 2023-05-15 DIAGNOSIS — M1711 Unilateral primary osteoarthritis, right knee: Secondary | ICD-10-CM | POA: Insufficient documentation

## 2023-05-15 DIAGNOSIS — G8929 Other chronic pain: Secondary | ICD-10-CM | POA: Diagnosis not present

## 2023-05-15 DIAGNOSIS — Z79891 Long term (current) use of opiate analgesic: Secondary | ICD-10-CM | POA: Insufficient documentation

## 2023-05-15 DIAGNOSIS — M1712 Unilateral primary osteoarthritis, left knee: Secondary | ICD-10-CM | POA: Diagnosis not present

## 2023-05-15 DIAGNOSIS — M542 Cervicalgia: Secondary | ICD-10-CM | POA: Diagnosis not present

## 2023-05-15 DIAGNOSIS — M7061 Trochanteric bursitis, right hip: Secondary | ICD-10-CM | POA: Diagnosis not present

## 2023-05-15 DIAGNOSIS — G894 Chronic pain syndrome: Secondary | ICD-10-CM | POA: Diagnosis not present

## 2023-05-15 DIAGNOSIS — M7062 Trochanteric bursitis, left hip: Secondary | ICD-10-CM | POA: Insufficient documentation

## 2023-05-15 DIAGNOSIS — M546 Pain in thoracic spine: Secondary | ICD-10-CM | POA: Insufficient documentation

## 2023-05-15 DIAGNOSIS — M47816 Spondylosis without myelopathy or radiculopathy, lumbar region: Secondary | ICD-10-CM | POA: Insufficient documentation

## 2023-05-15 MED ORDER — MORPHINE SULFATE ER 30 MG PO TBCR
30.0000 mg | EXTENDED_RELEASE_TABLET | Freq: Two times a day (BID) | ORAL | 0 refills | Status: DC
Start: 2023-06-03 — End: 2023-06-29

## 2023-05-15 MED ORDER — MORPHINE SULFATE 15 MG PO TABS: 15.0000 mg | ORAL_TABLET | Freq: Four times a day (QID) | ORAL | 0 refills | Status: DC | PRN

## 2023-05-15 MED ORDER — MORPHINE SULFATE ER 60 MG PO TBCR: 60.0000 mg | EXTENDED_RELEASE_TABLET | Freq: Two times a day (BID) | ORAL | 0 refills | Status: DC

## 2023-05-15 MED ORDER — MORPHINE SULFATE ER 60 MG PO TBCR
60.0000 mg | EXTENDED_RELEASE_TABLET | Freq: Two times a day (BID) | ORAL | 0 refills | Status: DC
Start: 2023-05-26 — End: 2023-05-15

## 2023-05-15 NOTE — Progress Notes (Signed)
 Subjective:    Patient ID: [name], female    DOB: 08/26/1960, 63 y.o.   MRN: [id number]  HPI: [name] is a 63 y.o. female who returns for follow up appointment for chronic pain and medication refill. She states her pain is located in her neck, mid- back mainly left side, lower back, bilateral hips and bilateral knee pain. She  rates her pain 9. Her current exercise regime is walking and performing stretching exercises.  Ms. [place] Morphine equivalent is 230.00 MME.   Last Oral Swab was Performed on 04/18/2023, it was consistent.      Pain Inventory Average Pain 8 Pain Right Now 9 My pain is constant, sharp, burning, dull, stabbing, tingling, and aching  In the last 24 hours, has pain interfered with the following? General activity 10 Relation with others 10 Enjoyment of life 10 What TIME of day is your pain at its worst? morning , daytime, evening, night, and varies Sleep (in general) Poor  Pain is worse with: walking, bending, sitting, standing, and some activites Pain improves with: rest, heat/ice, pacing activities, medication, TENS, and injections Relief from Meds: 7  Family History  Problem Relation Age of Onset   Depression Mother    Hypertension Mother    Anxiety disorder Mother    Obesity Mother    Varicose Veins Mother    Hypertension Father    Heart failure Father    Diabetes Father    Heart attack Father    Arthritis Father    Cancer Father    Obesity Father    Colon cancer Neg Hx    Esophageal cancer Neg Hx    Stomach cancer Neg Hx    Rectal cancer Neg Hx    Social History   Socioeconomic History   Marital status: Married    Spouse name: [name]   Number of children: 2   Years of education: some college   Highest education level: Some college, no degree  Occupational History   Occupation: On disability    Employer: UNEMPLOYED  Tobacco Use   Smoking status: Some Days    Current packs/day: 0.00    Types: Cigarettes     Last attempt to quit: 02/05/1973    Years since quitting: 50.3   Smokeless tobacco: Never  Vaping Use   Vaping status: Never Used  Substance and Sexual Activity   Alcohol use: No   Drug use: No   Sexual activity: Not Currently    Partners: Male    Birth control/protection: None  Other Topics Concern   Not on file  Social History Narrative   On disability. Lives with her husband. She has one adopted daughter. She likes to read in her free time. She does chair exercises for 30-45 mins everyday.   Social Drivers of Health   Financial Resource Strain: Medium Risk (01/23/2023)   Overall Financial Resource Strain (CARDIA)    Difficulty of Paying Living Expenses: Somewhat hard  Food Insecurity: No Food Insecurity (01/22/2023)   Hunger Vital Sign    Worried About Running Out of Food in the Last Year: Never true    Ran Out of Food in the Last Year: Never true  Transportation Needs: No Transportation Needs (01/23/2023)   PRAPARE - Administrator, Civil Service (Medical): No    Lack of Transportation (Non-Medical): No  Physical Activity: Unknown (01/22/2023)   Exercise Vital Sign    Days of Exercise per Week: 0 days    Minutes  of Exercise per Session: Not on file  Stress: Stress Concern Present (01/23/2023)   [employer] of Occupational Health - Occupational Stress Questionnaire    Feeling of Stress : To some extent  Social Connections: Unknown (01/23/2023)   Social Connection and Isolation Panel [NHANES]    Frequency of Communication with Friends and Family: Patient declined    Frequency of Social Gatherings with Friends and Family: Patient declined    Attends Religious Services: Never    Database administrator or Organizations: No    Attends Engineer, structural: Never    Marital Status: Married   Past Surgical History:  Procedure Laterality Date   ABDOMINAL HYSTERECTOMY  06/2002   Basel Cell Carcinoma  06/2005, 07/2005   nasal tip and reconstruction    BREAST CYST ASPIRATION     CARPAL TUNNEL RELEASE  07/1993   both hands   COLONOSCOPY     COSMETIC SURGERY  Basal cell carcinoma of nose   HEMORROIDECTOMY  08/2001   JOINT REPLACEMENT  Left knee replacement   KNEE ARTHROPLASTY  09/2001, 05/2003   left knee, chondromalasia   KNEE ARTHROSCOPY  02/2003   Right knee, tisse release, chrondromalacia   REPLACEMENT TOTAL KNEE  12/2003   Left    THYROID LOBECTOMY  01/2005   malignant area removed from right lobe   THYROID SURGERY     thyroid lobe removal   TONSILLECTOMY  09/1972   TUBAL LIGATION  10/1997   Ulnar nerve entrapment  03/1993   left elbow   Past Surgical History:  Procedure Laterality Date   ABDOMINAL HYSTERECTOMY  06/2002   Basel Cell Carcinoma  06/2005, 07/2005   nasal tip and reconstruction   BREAST CYST ASPIRATION     CARPAL TUNNEL RELEASE  07/1993   both hands   COLONOSCOPY     COSMETIC SURGERY  Basal cell carcinoma of nose   HEMORROIDECTOMY  08/2001   JOINT REPLACEMENT  Left knee replacement   KNEE ARTHROPLASTY  09/2001, 05/2003   left knee, chondromalasia   KNEE ARTHROSCOPY  02/2003   Right knee, tisse release, chrondromalacia   REPLACEMENT TOTAL KNEE  12/2003   Left    THYROID LOBECTOMY  01/2005   malignant area removed from right lobe   THYROID SURGERY     thyroid lobe removal   TONSILLECTOMY  09/1972   TUBAL LIGATION  10/1997   Ulnar nerve entrapment  03/1993   left elbow   Past Medical History:  Diagnosis Date   Allergy    Anxiety    Cancer (HCC)    cancerous nodules on thyroid   Cancer (HCC)    basal cell Cancer-nose   Chronic pain syndrome    COPD (chronic obstructive pulmonary disease) (HCC)    Degeneration of lumbar or lumbosacral intervertebral disc    Depression    Facet syndrome, lumbar    GERD (gastroesophageal reflux disease)    Hyperlipidemia    Intervertebral lumbar disc disorder with myelopathy, lumbar region    Lumbosacral spondylosis without myelopathy    Migraine without aura,  with intractable migraine, so stated, without mention of status migrainosus    Primary localized osteoarthrosis, lower leg    Thyroid disease    hypothyroid   Unspecified musculoskeletal disorders and symptoms referable to neck    cervical/trapezius   Wt 248 lb (112.5 kg)   BMI 37.16 kg/m   Opioid Risk Score:   Fall Risk Score:  `1  Depression screen [facility] 2/9  05/15/2023    9:34 AM 04/18/2023    8:57 AM 03/27/2023   10:46 AM 01/23/2023    3:08 PM 11/28/2022    8:24 AM 09/27/2022    8:25 AM 08/09/2022    8:44 AM  Depression screen PHQ 2/9  Decreased Interest 1 1 3 1 1 1 3   Down, Depressed, Hopeless 1 1 3 1 1 1 3   PHQ - 2 Score 2 2 6 2 2 2 6   Altered sleeping   3 3     Tired, decreased energy   3 3     Change in appetite   2 1     Feeling bad or failure about yourself    2 1     Trouble concentrating   3 0     Moving slowly or fidgety/restless   0 0     Suicidal thoughts   1 0     PHQ-9 Score   20 10     Difficult doing work/chores   Somewhat difficult Somewhat difficult       Review of Systems  Musculoskeletal:  Positive for back pain, gait problem and neck pain.       Left shoulder, hips  All other systems reviewed and are negative.      Objective:   Physical Exam Vitals and nursing note reviewed.  Constitutional:      Appearance: Normal appearance.  Neck:     Comments: Cervical Paraspinal Tenderness: C-5-C-6 Cardiovascular:     Rate and Rhythm: Normal rate and regular rhythm.     Pulses: Normal pulses.     Heart sounds: Normal heart sounds.  Pulmonary:     Effort: Pulmonary effort is normal.     Breath sounds: Normal breath sounds.  Musculoskeletal:     Comments: Normal Muscle Bulk and Muscle Testing Reveals:  Upper Extremities: Full ROM and Muscle Strength 5/5 Thoracic Paraspinal Tenderness: T-4-T-6: Mainly Left Side   Lumbar Paraspinal Tenderness: L-4-L-5 Bilateral Greater Trochanter Tenderness Lower Extremities: Decreased ROM and Muscle Strength  5/5 Bilateral Lower Extremities Flexion Produces Pain into her Bilateral Patella's Arises from Table slowly, using cane for support Antalgic  Gait     Skin:    General: Skin is warm and dry.  Neurological:     Mental Status: She is alert and oriented to person, place, and time.  Psychiatric:        Mood and Affect: Mood normal.        Behavior: Behavior normal.         Assessment & Plan:  1.Chronic Bilateral Thoracic Back Pain:  No complaints today. Continue HEP as Tolerated. Continue to Monitor. 05/15/2023 2.  Low Back Pain/ Lumbar Facet Arthropathy: Refilled: MSIR 15 mg one tablet every 6 hours as needed #100 and   Refilled: MS Contin 60 mg and MS Contin  30 mg Q 12 hours to equal   90 mg  #60/60.Marland Kitchen Second script sent to pharmacy to accommodate to scheduled appointment.  Continue Gabapentin and Pamelor. 05/15/2023 We will continue the opioid monitoring program, this consists of regular clinic visits, examinations, urine drug screen, pill counts as well as use of [place] Controlled Substance Reporting system. A 12 month History has been reviewed on the [place] Controlled Substance Reporting System on 05/15/2023. 3. Degenerative Disk Disease:Encouraged Continue HEP as Tolertated  and heat therapy. Continue Current Medication Regime.05/15/2023 4. Bilateral Greater Trochanteric Bursitis:  Continue current treatment regimen with Heat and Ice Therapy. 05/15/2023 5. Osteoarthritis of Bilateral  Knees: R>L Continue current treatment modality with home exercise program and heat therapy. 05/15/2023 6. Migraine Headaches: Continue current medication regimen  Continue with  Migraine Journal. Aimovig discontinued due to Hives. Continue Lamictal and Relpax. Continue to Monitor.  05/15/2023 7. Smoking Cessation: She has quit smoking since 08/25/2016. Continue to Monitor. 05/15/2023 8.Constipation: Continue  Current medication regime with Linzess. 05/15/2023 9. Muscle Spasm: S/P Trigger  Point Injection with Dr Riley Kill on 03/07/2023.Continue current medication regime with Tizanidine.05/15/2023 10. DepressionAnxiety/ Panic Attacks: Continue current medication regimen and treatment modality.  Celexa.Lamictal  and Counseling with [place] Breeze and  Dr. Gilmore Laroche Psychiatrist. 05/15/2023 11. RSD: Continue with current medication Regime with Gabapentin. 05/15/2023 12. DeQuervain's Tenosynovitis: No complaints today. Contiue to Monitor. 05/15/2023   F/U in 1 months

## 2023-05-16 ENCOUNTER — Other Ambulatory Visit: Payer: Self-pay | Admitting: Registered Nurse

## 2023-05-23 ENCOUNTER — Telehealth: Payer: Self-pay

## 2023-05-23 NOTE — Telephone Encounter (Signed)
 I called patient and advised her to reach out to [facility] to schedule another colonoscopy.

## 2023-05-23 NOTE — Progress Notes (Signed)
 [facility] Quality Team Note  Name: [name] Date of Birth: 1960-08-25 MRN: [id number] Date: 05/23/2023  [facility] Quality Team has reviewed this patient's chart, please see recommendations below:  [facility] Quality Other; (PATIENT DUE FOR eGFR, COLON CANCER SCREENING, PLEASE ADDRESS AT UPCOMING OFFICE VISIT WITH PCP. I HAVE FAXED [facility] FOR EYE EXAM RECORDS.)

## 2023-06-06 ENCOUNTER — Other Ambulatory Visit: Payer: Self-pay | Admitting: Registered Nurse

## 2023-06-06 DIAGNOSIS — T402X5A Adverse effect of other opioids, initial encounter: Secondary | ICD-10-CM

## 2023-06-07 ENCOUNTER — Encounter: Payer: Self-pay | Admitting: Family Medicine

## 2023-06-07 NOTE — Progress Notes (Signed)
 Hi [name], we got your CT of the neck back.  What they did note is that your parotid glands are a little bit more prominent so they are a little bit on the larger side and they have a little bit more fatty infiltration in the gland.  But no sign of a mass or calcification and no abnormal lymph nodes which is fantastic.  So nothing worrisome on the scan.  Let us  know to when she gets her eye exam so we can get that updated in the chart or if you have already had 1 this year please let us  know.

## 2023-06-08 NOTE — Progress Notes (Signed)
 I think at this point she will probably have to live with it they do not usually take a parotid gland out unless it is growing some type of tumor.  The only thing that will help would be avoiding smoking which has been known to cause inflammation in the parotid glands and some weight loss would help decrease the fat content of the gland as well.

## 2023-06-11 ENCOUNTER — Telehealth: Payer: Self-pay | Admitting: Registered Nurse

## 2023-06-11 DIAGNOSIS — M1711 Unilateral primary osteoarthritis, right knee: Secondary | ICD-10-CM

## 2023-06-11 DIAGNOSIS — G894 Chronic pain syndrome: Secondary | ICD-10-CM

## 2023-06-11 MED ORDER — MORPHINE SULFATE 15 MG PO TABS: 15.0000 mg | ORAL_TABLET | Freq: Four times a day (QID) | ORAL | 0 refills | Status: DC | PRN

## 2023-06-11 MED ORDER — MORPHINE SULFATE ER 60 MG PO TBCR: 60.0000 mg | EXTENDED_RELEASE_TABLET | Freq: Two times a day (BID) | ORAL | 0 refills | Status: DC

## 2023-06-11 NOTE — Telephone Encounter (Signed)
 PMP was Reviewed.  MS Contin  60 mg and MSIR e-scribed to pharmacy.  [name] is aware via My-Chart message.

## 2023-06-25 ENCOUNTER — Ambulatory Visit: Payer: Medicare Other | Admitting: Family Medicine

## 2023-06-25 NOTE — Progress Notes (Deleted)
   Established Patient Office Visit  Subjective  Patient ID: [name], female    DOB: 07-11-1960  Age: 63 y.o. MRN: [id number]  No chief complaint on file.   HPI  Diabetes - no hypoglycemic events. No wounds or sores that are not healing well. No increased thirst or urination. Checking glucose at home. Taking medications as prescribed without any side effects.   {History (Optional):23778}  ROS    Objective:     There were no vitals taken for this visit. {Vitals History (Optional):23777}  Physical Exam   No results found for any visits on 06/25/23.  {Labs (Optional):23779}  The ASCVD Risk score (Arnett DK, et al., 2019) failed to calculate for the following reasons:   Cannot find a previous HDL lab   Cannot find a previous total cholesterol lab    Assessment & Plan:   Problem List Items Addressed This Visit       Endocrine   Diabetes type 2, controlled (HCC) - Primary    No follow-ups on file.    Duaine German, MD

## 2023-06-26 ENCOUNTER — Encounter: Admitting: Registered Nurse

## 2023-06-28 NOTE — Progress Notes (Signed)
 Subjective:    Patient ID: [name], female    DOB: [birth year], 63 y.o.   MRN: [id number]  HPI: [name] is a 63 y.o. female who returns for follow up appointment for chronic pain and medication refill. She states her pain is located in her neck, mid- back mainly left side, lower back and bilateral knee pain R>L. She rates her pain 8. Her current exercise regime is walking and performing stretching exercises.  Ms. Subramanian Morphine  equivalent is 240.00 MME.   Last Oral Swab was Performed on 04/18/2023, it was consistent.     Pain Inventory Average Pain 8 Pain Right Now 8 My pain is constant, sharp, burning, dull, stabbing, tingling, and aching  In the last 24 hours, has pain interfered with the following? General activity 10 Relation with others 10 Enjoyment of life 10 What TIME of day is your pain at its worst? morning , daytime, evening, and night Sleep (in general) Poor  Pain is worse with: walking, bending, standing, and some activites Pain improves with: rest, heat/ice, therapy/exercise, pacing activities, medication, TENS, and injections Relief from Meds: 5  Family History  Problem Relation Age of Onset   Depression Mother    Hypertension Mother    Anxiety disorder Mother    Obesity Mother    Varicose Veins Mother    Hypertension Father    Heart failure Father    Diabetes Father    Heart attack Father    Arthritis Father    Cancer Father    Obesity Father    Colon cancer Neg Hx    Esophageal cancer Neg Hx    Stomach cancer Neg Hx    Rectal cancer Neg Hx    Social History   Socioeconomic History   Marital status: Married    Spouse name: Oletta Berry   Number of children: 2   Years of education: some college   Highest education level: Some college, no degree  Occupational History   Occupation: On disability    Employer: UNEMPLOYED  Tobacco Use   Smoking status: Some Days    Current packs/day: 0.00    Types: Cigarettes    Last attempt  to quit: 02/05/1973    Years since quitting: 50.4   Smokeless tobacco: Never  Vaping Use   Vaping status: Never Used  Substance and Sexual Activity   Alcohol  use: No   Drug use: No   Sexual activity: Not Currently    Partners: Male    Birth control/protection: None  Other Topics Concern   Not on file  Social History Narrative   On disability. Lives with her husband. She has one adopted daughter. She likes to read in her free time. She does chair exercises for 30-45 mins everyday.   Social Drivers of Health   Financial Resource Strain: Medium Risk (01/23/2023)   Overall Financial Resource Strain (CARDIA)    Difficulty of Paying Living Expenses: Somewhat hard  Food Insecurity: No Food Insecurity (01/22/2023)   Hunger Vital Sign    Worried About Running Out of Food in the Last Year: Never true    Ran Out of Food in the Last Year: Never true  Transportation Needs: No Transportation Needs (01/23/2023)   PRAPARE - Administrator, Civil Service (Medical): No    Lack of Transportation (Non-Medical): No  Physical Activity: Unknown (01/22/2023)   Exercise Vital Sign    Days of Exercise per Week: 0 days    Minutes of Exercise per Session:  Not on file  Stress: Stress Concern Present (01/23/2023)   [employer] of Occupational Health - Occupational Stress Questionnaire    Feeling of Stress : To some extent  Social Connections: Unknown (01/23/2023)   Social Connection and Isolation Panel [NHANES]    Frequency of Communication with Friends and Family: Patient declined    Frequency of Social Gatherings with Friends and Family: Patient declined    Attends Religious Services: Never    Database administrator or Organizations: No    Attends Engineer, structural: Never    Marital Status: Married   Past Surgical History:  Procedure Laterality Date   ABDOMINAL HYSTERECTOMY  06/2002   Basel Cell Carcinoma  06/2005, 07/2005   nasal tip and reconstruction   BREAST CYST  ASPIRATION     CARPAL TUNNEL RELEASE  07/1993   both hands   COLONOSCOPY     COSMETIC SURGERY  Basal cell carcinoma of nose   HEMORROIDECTOMY  08/2001   JOINT REPLACEMENT  Left knee replacement   KNEE ARTHROPLASTY  09/2001, 05/2003   left knee, chondromalasia   KNEE ARTHROSCOPY  02/2003   Right knee, tisse release, chrondromalacia   REPLACEMENT TOTAL KNEE  12/2003   Left    THYROID  LOBECTOMY  01/2005   malignant area removed from right lobe   THYROID  SURGERY     thyroid  lobe removal   TONSILLECTOMY  09/1972   TUBAL LIGATION  10/1997   Ulnar nerve entrapment  03/1993   left elbow   Past Surgical History:  Procedure Laterality Date   ABDOMINAL HYSTERECTOMY  06/2002   Basel Cell Carcinoma  06/2005, 07/2005   nasal tip and reconstruction   BREAST CYST ASPIRATION     CARPAL TUNNEL RELEASE  07/1993   both hands   COLONOSCOPY     COSMETIC SURGERY  Basal cell carcinoma of nose   HEMORROIDECTOMY  08/2001   JOINT REPLACEMENT  Left knee replacement   KNEE ARTHROPLASTY  09/2001, 05/2003   left knee, chondromalasia   KNEE ARTHROSCOPY  02/2003   Right knee, tisse release, chrondromalacia   REPLACEMENT TOTAL KNEE  12/2003   Left    THYROID  LOBECTOMY  01/2005   malignant area removed from right lobe   THYROID  SURGERY     thyroid  lobe removal   TONSILLECTOMY  09/1972   TUBAL LIGATION  10/1997   Ulnar nerve entrapment  03/1993   left elbow   Past Medical History:  Diagnosis Date   Allergy    Anxiety    Cancer (HCC)    cancerous nodules on thyroid    Cancer (HCC)    basal cell Cancer-nose   Chronic pain syndrome    COPD (chronic obstructive pulmonary disease) (HCC)    Degeneration of lumbar or lumbosacral intervertebral disc    Depression    Facet syndrome, lumbar    GERD (gastroesophageal reflux disease)    Hyperlipidemia    Intervertebral lumbar disc disorder with myelopathy, lumbar region    Lumbosacral spondylosis without myelopathy    Migraine without aura, with  intractable migraine, so stated, without mention of status migrainosus    Primary localized osteoarthrosis, lower leg    Thyroid  disease    hypothyroid   Unspecified musculoskeletal disorders and symptoms referable to neck    cervical/trapezius   BP 127/70   Pulse 93   Ht 5' 8.5" (1.74 m)   Wt 241 lb 6.4 oz (109.5 kg)   SpO2 91%   BMI 36.17 kg/m  Opioid Risk Score:   Fall Risk Score:  `1  Depression screen [facility] 2/9     06/29/2023    9:15 AM 05/15/2023    9:34 AM 04/18/2023    8:57 AM 03/27/2023   10:46 AM 01/23/2023    3:08 PM 11/28/2022    8:24 AM 09/27/2022    8:25 AM  Depression screen PHQ 2/9  Decreased Interest 1 1 1 3 1 1 1   Down, Depressed, Hopeless 1 1 1 3 1 1 1   PHQ - 2 Score 2 2 2 6 2 2 2   Altered sleeping    3 3    Tired, decreased energy    3 3    Change in appetite    2 1    Feeling bad or failure about yourself     2 1    Trouble concentrating    3 0    Moving slowly or fidgety/restless    0 0    Suicidal thoughts    1 0    PHQ-9 Score    20 10    Difficult doing work/chores    Somewhat difficult Somewhat difficult       Review of Systems  Musculoskeletal:  Positive for arthralgias, back pain, gait problem, myalgias and neck pain.       Right knee and elbows bilateral  All other systems reviewed and are negative.      Objective:   Physical Exam Vitals and nursing note reviewed.  Constitutional:      Appearance: Normal appearance.  Neck:     Comments: Cervical Paraspinal tenderness: C-5-C-6 Cardiovascular:     Rate and Rhythm: Normal rate and regular rhythm.     Pulses: Normal pulses.     Heart sounds: Normal heart sounds.  Pulmonary:     Effort: Pulmonary effort is normal.     Breath sounds: Normal breath sounds.  Musculoskeletal:     Comments: Normal Muscle Bulk and Muscle Testing Reveals:  Upper Extremities: Full ROM and Muscle Strength 5/5 Thoracic Paraspinal Tenderness: T-4-T-6 Mainly Left Side  Lumbar Paraspinal Tenderness:  L-3-L-5 Lower Extremities : Decreased ROM and Muscle Strength 5/5 Bilateral Lower Extremities Flexion Produces Pain into her Bilateral Patella's Arises from Table slowly Antalgic Gait     Skin:    General: Skin is warm and dry.  Neurological:     Mental Status: She is alert and oriented to Morales, place, and time.  Psychiatric:        Mood and Affect: Mood normal.        Behavior: Behavior normal.         Assessment & Plan:  1.Chronic Let Thoracic Back Pain:  Continue HEP as Tolerated. Continue to Monitor. 06/29/2023 2.  Low Back Pain/ Lumbar Facet Arthropathy: Refilled: MSIR 15 mg one tablet every 6 hours as needed #100 and   Refilled: MS Contin  60 mg and MS Contin   30 mg Q 12 hours to equal   90 mg  #60/60.Aaron Aas  Continue Gabapentin  and Pamelor . 06/29/2023 We will continue the opioid monitoring program, this consists of regular clinic visits, examinations, urine drug screen, pill counts as well as use of [place]  Controlled Substance Reporting system. A 12 month History has been reviewed on the [place]  Controlled Substance Reporting System on 06/29/2023. 3. Degenerative Disk Disease:Encouraged Continue HEP as Tolertated  and heat therapy. Continue Current Medication Regime.06/29/2023 4. Bilateral Greater Trochanteric Bursitis:  No complaints today. Continue current treatment regimen with Heat and Ice Therapy.  06/29/2023 5. Osteoarthritis of Bilateral Knees: R>L Continue current treatment modality with home exercise program and heat therapy. 06/29/2023 6. Migraine Headaches: Continue current medication regimen  Continue with  Migraine Journal. Aimovig  discontinued due to Hives. Continue Lamictal  and Relpax . Continue to Monitor.  06/29/2023 7. Smoking Cessation: She has resume smoking, she was educated on smoking cessation, she verbalizes understanding. She had quit smoking on 08/25/2016. Continue to Monitor. 06/29/2023 8.Constipation: Continue  Current medication regime with  Linzess . 06/29/2023 9. Muscle Spasm: S/P Trigger Point Injection with Dr Rachel Budds on 03/07/2023.Continue current medication regime with Tizanidine .06/29/2023 10. DepressionAnxiety/ Panic Attacks: Continue current medication regimen and treatment modality.  Celexa .Lamictal   and Counseling with Dallie Duel and  Dr. Aram Knights Psychiatrist. 06/29/2023 11. RSD: Continue with current medication Regime with Gabapentin . 06/29/2023 12. DeQuervain's Tenosynovitis: No complaints today. Contiue to Monitor. 06/29/2023   F/U in 1 months

## 2023-06-29 ENCOUNTER — Encounter: Payer: Self-pay | Admitting: Registered Nurse

## 2023-06-29 ENCOUNTER — Encounter: Attending: Physical Medicine and Rehabilitation | Admitting: Registered Nurse

## 2023-06-29 VITALS — BP 127/70 | HR 93 | Ht 68.5 in | Wt 241.4 lb

## 2023-06-29 DIAGNOSIS — G894 Chronic pain syndrome: Secondary | ICD-10-CM | POA: Insufficient documentation

## 2023-06-29 DIAGNOSIS — Z79891 Long term (current) use of opiate analgesic: Secondary | ICD-10-CM | POA: Insufficient documentation

## 2023-06-29 DIAGNOSIS — M1712 Unilateral primary osteoarthritis, left knee: Secondary | ICD-10-CM | POA: Diagnosis not present

## 2023-06-29 DIAGNOSIS — G8929 Other chronic pain: Secondary | ICD-10-CM | POA: Insufficient documentation

## 2023-06-29 DIAGNOSIS — M7918 Myalgia, other site: Secondary | ICD-10-CM | POA: Diagnosis not present

## 2023-06-29 DIAGNOSIS — M542 Cervicalgia: Secondary | ICD-10-CM | POA: Diagnosis not present

## 2023-06-29 DIAGNOSIS — Z5181 Encounter for therapeutic drug level monitoring: Secondary | ICD-10-CM | POA: Diagnosis not present

## 2023-06-29 DIAGNOSIS — M47816 Spondylosis without myelopathy or radiculopathy, lumbar region: Secondary | ICD-10-CM | POA: Diagnosis not present

## 2023-06-29 DIAGNOSIS — M546 Pain in thoracic spine: Secondary | ICD-10-CM | POA: Diagnosis not present

## 2023-06-29 DIAGNOSIS — M1711 Unilateral primary osteoarthritis, right knee: Secondary | ICD-10-CM | POA: Insufficient documentation

## 2023-06-29 MED ORDER — MORPHINE SULFATE ER 30 MG PO TBCR: 30.0000 mg | EXTENDED_RELEASE_TABLET | Freq: Two times a day (BID) | ORAL | 0 refills | Status: DC

## 2023-06-29 MED ORDER — MORPHINE SULFATE ER 60 MG PO TBCR: 60.0000 mg | EXTENDED_RELEASE_TABLET | Freq: Two times a day (BID) | ORAL | 0 refills | Status: DC

## 2023-06-29 MED ORDER — MORPHINE SULFATE 15 MG PO TABS: 15.0000 mg | ORAL_TABLET | Freq: Four times a day (QID) | ORAL | 0 refills | Status: DC | PRN

## 2023-07-24 NOTE — Progress Notes (Signed)
 Subjective:    Patient ID: [name], female    DOB: [birth year], 63 y.o.   MRN: [id number]  HPI: [name] is a 63 y.o. female who returns for follow up appointment for chronic pain and medication refill. She states her pain is located in her mid-back mainly left side, lower back and bilateral knee pain. She rates her pain 8. Her current exercise regime is walking and performing stretching exercises.  [name] Morphine  equivalent is 240.00 MME.   Last Oral Swab was Performed on 04/18/2023, it was consistent.      Pain Inventory Average Pain 8 Pain Right Now 8 My pain is constant, sharp, burning, dull, stabbing, tingling, and aching  In the last 24 hours, has pain interfered with the following? General activity 10 Relation with others 10 Enjoyment of life 10 What TIME of day is your pain at its worst? morning , daytime, evening, and night Sleep (in general) Poor  Pain is worse with: walking, bending, standing, and some activites Pain improves with: rest, heat/ice, medication, TENS, injections, and therapy Relief from Meds: 7  Family History  Problem Relation Age of Onset   Depression Mother    Hypertension Mother    Anxiety disorder Mother    Obesity Mother    Varicose Veins Mother    Hypertension Father    Heart failure Father    Diabetes Father    Heart attack Father    Arthritis Father    Cancer Father    Obesity Father    Colon cancer Neg Hx    Esophageal cancer Neg Hx    Stomach cancer Neg Hx    Rectal cancer Neg Hx    Social History   Socioeconomic History   Marital status: Married    Spouse name: [name]   Number of children: 2   Years of education: some college   Highest education level: Some college, no degree  Occupational History   Occupation: On disability    Employer: UNEMPLOYED  Tobacco Use   Smoking status: Some Days    Current packs/day: 0.00    Types: Cigarettes    Last attempt to quit: 02/05/1973    Years since  quitting: 50.4   Smokeless tobacco: Never  Vaping Use   Vaping status: Never Used  Substance and Sexual Activity   Alcohol  use: No   Drug use: No   Sexual activity: Not Currently    Partners: Male    Birth control/protection: None  Other Topics Concern   Not on file  Social History Narrative   On disability. Lives with her husband. She has one adopted daughter. She likes to read in her free time. She does chair exercises for 30-45 mins everyday.   Social Drivers of Health   Financial Resource Strain: Medium Risk (01/23/2023)   Overall Financial Resource Strain (CARDIA)    Difficulty of Paying Living Expenses: Somewhat hard  Food Insecurity: No Food Insecurity (01/22/2023)   Hunger Vital Sign    Worried About Running Out of Food in the Last Year: Never true    Ran Out of Food in the Last Year: Never true  Transportation Needs: No Transportation Needs (01/23/2023)   PRAPARE - Administrator, Civil Service (Medical): No    Lack of Transportation (Non-Medical): No  Physical Activity: Unknown (01/22/2023)   Exercise Vital Sign    Days of Exercise per Week: 0 days    Minutes of Exercise per Session: Not on file  Stress: Stress Concern Present (01/23/2023)   [employer] of Occupational Health - Occupational Stress Questionnaire    Feeling of Stress : To some extent  Social Connections: Unknown (01/23/2023)   Social Connection and Isolation Panel    Frequency of Communication with Friends and Family: Patient declined    Frequency of Social Gatherings with Friends and Family: Patient declined    Attends Religious Services: Never    Database administrator or Organizations: No    Attends Engineer, structural: Never    Marital Status: Married   Past Surgical History:  Procedure Laterality Date   ABDOMINAL HYSTERECTOMY  06/2002   Basel Cell Carcinoma  06/2005, 07/2005   nasal tip and reconstruction   BREAST CYST ASPIRATION     CARPAL TUNNEL RELEASE   07/1993   both hands   COLONOSCOPY     COSMETIC SURGERY  Basal cell carcinoma of nose   HEMORROIDECTOMY  08/2001   JOINT REPLACEMENT  Left knee replacement   KNEE ARTHROPLASTY  09/2001, 05/2003   left knee, chondromalasia   KNEE ARTHROSCOPY  02/2003   Right knee, tisse release, chrondromalacia   REPLACEMENT TOTAL KNEE  12/2003   Left    THYROID  LOBECTOMY  01/2005   malignant area removed from right lobe   THYROID  SURGERY     thyroid  lobe removal   TONSILLECTOMY  09/1972   TUBAL LIGATION  10/1997   Ulnar nerve entrapment  03/1993   left elbow   Past Surgical History:  Procedure Laterality Date   ABDOMINAL HYSTERECTOMY  06/2002   Basel Cell Carcinoma  06/2005, 07/2005   nasal tip and reconstruction   BREAST CYST ASPIRATION     CARPAL TUNNEL RELEASE  07/1993   both hands   COLONOSCOPY     COSMETIC SURGERY  Basal cell carcinoma of nose   HEMORROIDECTOMY  08/2001   JOINT REPLACEMENT  Left knee replacement   KNEE ARTHROPLASTY  09/2001, 05/2003   left knee, chondromalasia   KNEE ARTHROSCOPY  02/2003   Right knee, tisse release, chrondromalacia   REPLACEMENT TOTAL KNEE  12/2003   Left    THYROID  LOBECTOMY  01/2005   malignant area removed from right lobe   THYROID  SURGERY     thyroid  lobe removal   TONSILLECTOMY  09/1972   TUBAL LIGATION  10/1997   Ulnar nerve entrapment  03/1993   left elbow   Past Medical History:  Diagnosis Date   Allergy    Anxiety    Cancer (HCC)    cancerous nodules on thyroid    Cancer (HCC)    basal cell Cancer-nose   Chronic pain syndrome    COPD (chronic obstructive pulmonary disease) (HCC)    Degeneration of lumbar or lumbosacral intervertebral disc    Depression    Facet syndrome, lumbar    GERD (gastroesophageal reflux disease)    Hyperlipidemia    Intervertebral lumbar disc disorder with myelopathy, lumbar region    Lumbosacral spondylosis without myelopathy    Migraine without aura, with intractable migraine, so stated, without mention  of status migrainosus    Primary localized osteoarthrosis, lower leg    Thyroid  disease    hypothyroid   Unspecified musculoskeletal disorders and symptoms referable to neck    cervical/trapezius   There were no vitals taken for this visit.  Opioid Risk Score:   Fall Risk Score:  `1  Depression screen [facility] 2/9     06/29/2023    9:15 AM 05/15/2023  9:34 AM 04/18/2023    8:57 AM 03/27/2023   10:46 AM 01/23/2023    3:08 PM 11/28/2022    8:24 AM 09/27/2022    8:25 AM  Depression screen PHQ 2/9  Decreased Interest 1 1 1 3 1 1 1   Down, Depressed, Hopeless 1 1 1 3 1 1 1   PHQ - 2 Score 2 2 2 6 2 2 2   Altered sleeping    3 3    Tired, decreased energy    3 3    Change in appetite    2 1    Feeling bad or failure about yourself     2 1    Trouble concentrating    3 0    Moving slowly or fidgety/restless    0 0    Suicidal thoughts    1 0    PHQ-9 Score    20 10    Difficult doing work/chores    Somewhat difficult Somewhat difficult      Review of Systems  Musculoskeletal:  Positive for back pain and gait problem. Negative for neck pain.       Pain in both elbows, left knee, pain in both legs  All other systems reviewed and are negative.       Objective:   Physical Exam Vitals and nursing note reviewed.  Constitutional:      Appearance: Normal appearance.  Cardiovascular:     Rate and Rhythm: Normal rate and regular rhythm.     Pulses: Normal pulses.     Heart sounds: Normal heart sounds.  Pulmonary:     Effort: Pulmonary effort is normal.     Breath sounds: Normal breath sounds.  Musculoskeletal:     Comments: Normal Muscle Bulk and Muscle Testing Reveals:  Upper Extremities: Full ROM and Muscle Strength 5/5  Thoracic Paraspinal Tenderness: T-4-T-t Mainly Left Side Lumbar Paraspinal Tenderness: L-4-L-5         Lower Extremities: Decreased ROM and Muscle Strength 5/5 Bilateral Lower Extremities Flexion Produces Pain into her Bilateral Patella's Arises from Chair slowly  using cane for support Antalgic Gait     Skin:    General: Skin is warm and dry.  Neurological:     Mental Status: She is alert and oriented to person, place, and time.  Psychiatric:        Mood and Affect: Mood normal.        Behavior: Behavior normal.          Assessment & Plan:  1.Chronic Let Thoracic Back Pain:  Continue HEP as Tolerated. Continue to Monitor. 07/25/2023 2.  Low Back Pain/ Lumbar Facet Arthropathy: Refilled: MSIR 15 mg one tablet every 6 hours as needed #100 and   Refilled: MS Contin  60 mg and MS Contin   30 mg Q 12 hours to equal   90 mg  #60/60.SABRA  Continue Gabapentin  and Pamelor . 07/25/2023 We will continue the opioid monitoring program, this consists of regular clinic visits, examinations, urine drug screen, pill counts as well as use of [place]  Controlled Substance Reporting system. A 12 month History has been reviewed on the [place]  Controlled Substance Reporting System on 07/25/2023. 3. Degenerative Disk Disease:Encouraged Continue HEP as Tolertated  and heat therapy. Continue Current Medication Regime.07/25/2023 4. Bilateral Greater Trochanteric Bursitis:  No complaints today. Continue current treatment regimen with Heat and Ice Therapy. 07/25/2023 5. Osteoarthritis of Bilateral Knees: R>L Continue current treatment modality with home exercise program and heat therapy. 07/25/2023 6. Migraine Headaches:  Continue current medication regimen  Continue with  Migraine Journal. Aimovig  discontinued due to Hives. Continue Lamictal  and Relpax . Continue to Monitor.  07/25/2023 7. Smoking Cessation: She has resume smoking, she was educated on smoking cessation, she verbalizes understanding. She had quit smoking on 08/25/2016. Continue to Monitor. 07/25/2023 8.Constipation: Continue  Current medication regime with Linzess . 07/25/2023 9. Muscle Spasm: S/P Trigger Point Injection with Dr Babs on 03/07/2023.Continue current medication regime with  Tizanidine .07/25/2023 10. DepressionAnxiety/ Panic Attacks: Continue current medication regimen and treatment modality.  Celexa .Lamictal   and Counseling with Ronal Sink and  Dr. Geralene Psychiatrist. 07/25/2023 11. RSD: Continue with current medication Regime with Gabapentin . 07/25/2023 12. DeQuervain's Tenosynovitis: No complaints today. Contiue to Monitor. 07/25/2023   F/U in 1 months

## 2023-07-25 ENCOUNTER — Encounter: Payer: Self-pay | Admitting: Registered Nurse

## 2023-07-25 ENCOUNTER — Encounter: Attending: Physical Medicine and Rehabilitation | Admitting: Registered Nurse

## 2023-07-25 VITALS — BP 124/80 | HR 103 | Ht 68.5 in | Wt 239.0 lb

## 2023-07-25 DIAGNOSIS — G8929 Other chronic pain: Secondary | ICD-10-CM | POA: Diagnosis not present

## 2023-07-25 DIAGNOSIS — M1711 Unilateral primary osteoarthritis, right knee: Secondary | ICD-10-CM | POA: Insufficient documentation

## 2023-07-25 DIAGNOSIS — M47816 Spondylosis without myelopathy or radiculopathy, lumbar region: Secondary | ICD-10-CM | POA: Diagnosis not present

## 2023-07-25 DIAGNOSIS — M546 Pain in thoracic spine: Secondary | ICD-10-CM | POA: Diagnosis not present

## 2023-07-25 DIAGNOSIS — Z79891 Long term (current) use of opiate analgesic: Secondary | ICD-10-CM | POA: Diagnosis not present

## 2023-07-25 DIAGNOSIS — M1712 Unilateral primary osteoarthritis, left knee: Secondary | ICD-10-CM | POA: Insufficient documentation

## 2023-07-25 DIAGNOSIS — G894 Chronic pain syndrome: Secondary | ICD-10-CM | POA: Diagnosis not present

## 2023-07-25 DIAGNOSIS — M7918 Myalgia, other site: Secondary | ICD-10-CM | POA: Diagnosis not present

## 2023-07-25 DIAGNOSIS — Z5181 Encounter for therapeutic drug level monitoring: Secondary | ICD-10-CM | POA: Diagnosis not present

## 2023-07-25 MED ORDER — MORPHINE SULFATE ER 30 MG PO TBCR: 30.0000 mg | EXTENDED_RELEASE_TABLET | Freq: Two times a day (BID) | ORAL | 0 refills | Status: DC

## 2023-07-25 MED ORDER — MORPHINE SULFATE ER 60 MG PO TBCR: 60.0000 mg | EXTENDED_RELEASE_TABLET | Freq: Two times a day (BID) | ORAL | 0 refills | Status: DC

## 2023-07-25 MED ORDER — MORPHINE SULFATE 15 MG PO TABS: 15.0000 mg | ORAL_TABLET | Freq: Four times a day (QID) | ORAL | 0 refills | Status: DC | PRN

## 2023-08-15 ENCOUNTER — Telehealth: Payer: Self-pay | Admitting: Registered Nurse

## 2023-08-15 DIAGNOSIS — M1711 Unilateral primary osteoarthritis, right knee: Secondary | ICD-10-CM

## 2023-08-15 DIAGNOSIS — G894 Chronic pain syndrome: Secondary | ICD-10-CM

## 2023-08-15 MED ORDER — OXYCODONE HCL 10 MG PO TABS: 10.0000 mg | ORAL_TABLET | Freq: Four times a day (QID) | ORAL | 0 refills | Status: DC | PRN

## 2023-08-15 MED ORDER — MORPHINE SULFATE ER 60 MG PO TBCR: 60.0000 mg | EXTENDED_RELEASE_TABLET | Freq: Two times a day (BID) | ORAL | 0 refills | Status: DC

## 2023-08-15 NOTE — Telephone Encounter (Signed)
 PMP was Reviewed.  MS Contin  60mg  e-scribed  Oxycodone  10 mg E-scribed  MSIR: Not available Call placed to Ms. Herrnann regarding the above, she verbalizes understanding.

## 2023-08-21 NOTE — Progress Notes (Unsigned)
 Subjective:    Patient ID: [name], female    DOB: 04/04/1960, 63 y.o.   MRN: [id number]  HPI: [name] is a 63 y.o. female who returns for follow up appointment for chronic pain and medication refill. She states her pain is located in  mid- lower back and bilateral knee pain. She rates her pain 8. Her current exercise regime is walking and performing stretching exercises.  [name] Morphine  equivalent is 240.00 MME.  Oral Swab was Performed Today.      Pain Inventory Average Pain 9 Pain Right Now 8 My pain is constant, sharp, burning, dull, stabbing, tingling, and aching  In the last 24 hours, has pain interfered with the following? General activity 10 Relation with others 10 Enjoyment of life 10 What TIME of day is your pain at its worst? morning , daytime, evening, night, and varies Sleep (in general) Poor  Pain is worse with: walking, bending, standing, and some activites Pain improves with: rest, heat/ice, pacing activities, medication, and injections Relief from Meds: 4  Family History  Problem Relation Age of Onset   Depression Mother    Hypertension Mother    Anxiety disorder Mother    Obesity Mother    Varicose Veins Mother    Hypertension Father    Heart failure Father    Diabetes Father    Heart attack Father    Arthritis Father    Cancer Father    Obesity Father    Colon cancer Neg Hx    Esophageal cancer Neg Hx    Stomach cancer Neg Hx    Rectal cancer Neg Hx    Social History   Socioeconomic History   Marital status: Married    Spouse name: [name]   Number of children: 2   Years of education: some college   Highest education level: Some college, no degree  Occupational History   Occupation: On disability    Employer: UNEMPLOYED  Tobacco Use   Smoking status: Some Days    Current packs/day: 0.00    Types: Cigarettes    Last attempt to quit: 02/05/1973    Years since quitting: 50.5   Smokeless tobacco: Never   Vaping Use   Vaping status: Never Used  Substance and Sexual Activity   Alcohol  use: No   Drug use: No   Sexual activity: Not Currently    Partners: Male    Birth control/protection: None  Other Topics Concern   Not on file  Social History Narrative   On disability. Lives with her husband. She has one adopted daughter. She likes to read in her free time. She does chair exercises for 30-45 mins everyday.   Social Drivers of Health   Financial Resource Strain: Medium Risk (01/23/2023)   Overall Financial Resource Strain (CARDIA)    Difficulty of Paying Living Expenses: Somewhat hard  Food Insecurity: No Food Insecurity (01/22/2023)   Hunger Vital Sign    Worried About Running Out of Food in the Last Year: Never true    Ran Out of Food in the Last Year: Never true  Transportation Needs: No Transportation Needs (01/23/2023)   PRAPARE - Administrator, Civil Service (Medical): No    Lack of Transportation (Non-Medical): No  Physical Activity: Unknown (01/22/2023)   Exercise Vital Sign    Days of Exercise per Week: 0 days    Minutes of Exercise per Session: Not on file  Stress: Stress Concern Present (01/23/2023)   [place]  [employer] - Occupational Stress Questionnaire    Feeling of Stress : To some extent  Social Connections: Unknown (01/23/2023)   Social Connection and Isolation Panel    Frequency of Communication with Friends and Family: Patient declined    Frequency of Social Gatherings with Friends and Family: Patient declined    Attends Religious Services: Never    Database administrator or Organizations: No    Attends Engineer, structural: Never    Marital Status: Married   Past Surgical History:  Procedure Laterality Date   ABDOMINAL HYSTERECTOMY  06/2002   Basel Cell Carcinoma  06/2005, 07/2005   nasal tip and reconstruction   BREAST CYST ASPIRATION     CARPAL TUNNEL RELEASE  07/1993   both hands   COLONOSCOPY      COSMETIC SURGERY  Basal cell carcinoma of nose   HEMORROIDECTOMY  08/2001   JOINT REPLACEMENT  Left knee replacement   KNEE ARTHROPLASTY  09/2001, 05/2003   left knee, chondromalasia   KNEE ARTHROSCOPY  02/2003   Right knee, tisse release, chrondromalacia   REPLACEMENT TOTAL KNEE  12/2003   Left    THYROID  LOBECTOMY  01/2005   malignant area removed from right lobe   THYROID  SURGERY     thyroid  lobe removal   TONSILLECTOMY  09/1972   TUBAL LIGATION  10/1997   Ulnar nerve entrapment  03/1993   left elbow   Past Surgical History:  Procedure Laterality Date   ABDOMINAL HYSTERECTOMY  06/2002   Basel Cell Carcinoma  06/2005, 07/2005   nasal tip and reconstruction   BREAST CYST ASPIRATION     CARPAL TUNNEL RELEASE  07/1993   both hands   COLONOSCOPY     COSMETIC SURGERY  Basal cell carcinoma of nose   HEMORROIDECTOMY  08/2001   JOINT REPLACEMENT  Left knee replacement   KNEE ARTHROPLASTY  09/2001, 05/2003   left knee, chondromalasia   KNEE ARTHROSCOPY  02/2003   Right knee, tisse release, chrondromalacia   REPLACEMENT TOTAL KNEE  12/2003   Left    THYROID  LOBECTOMY  01/2005   malignant area removed from right lobe   THYROID  SURGERY     thyroid  lobe removal   TONSILLECTOMY  09/1972   TUBAL LIGATION  10/1997   Ulnar nerve entrapment  03/1993   left elbow   Past Medical History:  Diagnosis Date   Allergy    Anxiety    Cancer (HCC)    cancerous nodules on thyroid    Cancer (HCC)    basal cell Cancer-nose   Chronic pain syndrome    COPD (chronic obstructive pulmonary disease) (HCC)    Degeneration of lumbar or lumbosacral intervertebral disc    Depression    Facet syndrome, lumbar    GERD (gastroesophageal reflux disease)    Hyperlipidemia    Intervertebral lumbar disc disorder with myelopathy, lumbar region    Lumbosacral spondylosis without myelopathy    Migraine without aura, with intractable migraine, so stated, without mention of status migrainosus    Primary  localized osteoarthrosis, lower leg    Thyroid  disease    hypothyroid   Unspecified musculoskeletal disorders and symptoms referable to neck    cervical/trapezius   There were no vitals taken for this visit.  Opioid Risk Score:   Fall Risk Score:  `1  Depression screen [facility] 2/9     07/25/2023    9:45 AM 06/29/2023    9:15 AM 05/15/2023    9:34 AM  04/18/2023    8:57 AM 03/27/2023   10:46 AM 01/23/2023    3:08 PM 11/28/2022    8:24 AM  Depression screen PHQ 2/9  Decreased Interest 1 1 1 1 3 1 1   Down, Depressed, Hopeless 1 1 1 1 3 1 1   PHQ - 2 Score 2 2 2 2 6 2 2   Altered sleeping     3 3   Tired, decreased energy     3 3   Change in appetite     2 1   Feeling bad or failure about yourself      2 1   Trouble concentrating     3 0   Moving slowly or fidgety/restless     0 0   Suicidal thoughts     1 0   PHQ-9 Score     20 10   Difficult doing work/chores     Somewhat difficult Somewhat difficult     Review of Systems  Musculoskeletal:  Positive for back pain, gait problem and neck pain.       Tailbone pain, right knee pain, pain in both legs  All other systems reviewed and are negative.      Objective:   Physical Exam Vitals and nursing note reviewed.  Constitutional:      Appearance: Normal appearance.  Neck:     Comments: Cervical Paraspinal Tenderness: C-5-C-6 Cardiovascular:     Rate and Rhythm: Normal rate and regular rhythm.     Pulses: Normal pulses.     Heart sounds: Normal heart sounds.  Pulmonary:     Effort: Pulmonary effort is normal.     Breath sounds: Normal breath sounds.  Musculoskeletal:     Comments: Normal Muscle Bulk and Muscle Testing Reveals:  Upper Extremities: Full ROM and Muscle Strength 5/5  Thoracic Paraspinal Tenderness: T-4-T-6 Mainly Left Side  Lumbar Paraspinal Tenderness : L-3-L-5 Lower Extremities: Decreased ROM and Muscle Strength 5/5 Bilateral Lower Extremities: Decreased ROM and Muscle Strength 5/5 Bilateral Lower Extremities  Flexion Produces Pain into her Bilateral Patella's Arises from Table slowly, using cane for support Antalgic  Gait     Skin:    General: Skin is warm and dry.  Neurological:     Mental Status: She is alert and oriented to person, place, and time.  Psychiatric:        Mood and Affect: Mood normal.        Behavior: Behavior normal.           Assessment & Plan:  1.Chronic Left Thoracic Back Pain:  Continue HEP as Tolerated. Continue to Monitor. 08/23/2023 2.  Low Back Pain/ Lumbar Facet Arthropathy: Refilled: Oxycodone   10 mg one tablet every 6 hours as needed #100 and   Refilled: MS Contin  60 mg and MS Contin   30 mg Q 12 hours to equal   90 mg  #60/60.SABRA  Continue Gabapentin  and Pamelor . 08/23/2023 We will continue the opioid monitoring program, this consists of regular clinic visits, examinations, urine drug screen, pill counts as well as use of [place]  Controlled Substance Reporting system. A 12 month History has been reviewed on the [place]  Controlled Substance Reporting System on 08/23/2023. 3. Degenerative Disk Disease:Encouraged Continue HEP as Tolertated  and heat therapy. Continue Current Medication Regime.08/23/2023 4. Bilateral Greater Trochanteric Bursitis:  No complaints today. Continue current treatment regimen with Heat and Ice Therapy. 08/23/2023 5. Osteoarthritis of Bilateral Knees: R>L Continue current treatment modality with home exercise program and heat therapy.  08/23/2023 6. Migraine Headaches: Continue current medication regimen  Continue with  Migraine Journal. Aimovig  discontinued due to Hives. Continue Lamictal  and Relpax . Continue to Monitor.  08/23/2023 7. Smoking Cessation: She has resume smoking, she was educated on smoking cessation, she verbalizes understanding. She had quit smoking on 08/25/2016. Continue to Monitor. 08/23/2023 8.Constipation: Continue  Current medication regime with Linzess . 08/23/2023 9. Muscle Spasm: S/P Trigger Point  Injection with Dr Babs on 03/07/2023.Continue current medication regime with Tizanidine .08/23/2023 10. DepressionAnxiety/ Panic Attacks: Continue current medication regimen and treatment modality.  Celexa .Lamictal   and Counseling with Ronal Sink and  Dr. Geralene Psychiatrist. 08/23/2023 11. RSD: Continue with current medication Regime with Gabapentin . 08/23/2023 12. DeQuervain's Tenosynovitis: No complaints today. Contiue to Monitor. 08/23/2023   F/U in 1 months

## 2023-08-23 ENCOUNTER — Encounter: Payer: Self-pay | Admitting: Registered Nurse

## 2023-08-23 ENCOUNTER — Encounter: Attending: Physical Medicine and Rehabilitation | Admitting: Registered Nurse

## 2023-08-23 VITALS — BP 118/78 | HR 100 | Ht 68.5 in | Wt 241.0 lb

## 2023-08-23 DIAGNOSIS — G8929 Other chronic pain: Secondary | ICD-10-CM | POA: Insufficient documentation

## 2023-08-23 DIAGNOSIS — M546 Pain in thoracic spine: Secondary | ICD-10-CM | POA: Insufficient documentation

## 2023-08-23 DIAGNOSIS — M1712 Unilateral primary osteoarthritis, left knee: Secondary | ICD-10-CM | POA: Insufficient documentation

## 2023-08-23 DIAGNOSIS — Z5181 Encounter for therapeutic drug level monitoring: Secondary | ICD-10-CM | POA: Diagnosis not present

## 2023-08-23 DIAGNOSIS — M1711 Unilateral primary osteoarthritis, right knee: Secondary | ICD-10-CM | POA: Insufficient documentation

## 2023-08-23 DIAGNOSIS — M2550 Pain in unspecified joint: Secondary | ICD-10-CM | POA: Diagnosis not present

## 2023-08-23 DIAGNOSIS — G894 Chronic pain syndrome: Secondary | ICD-10-CM | POA: Diagnosis not present

## 2023-08-23 DIAGNOSIS — Z79891 Long term (current) use of opiate analgesic: Secondary | ICD-10-CM | POA: Diagnosis not present

## 2023-08-23 DIAGNOSIS — M47816 Spondylosis without myelopathy or radiculopathy, lumbar region: Secondary | ICD-10-CM | POA: Insufficient documentation

## 2023-08-23 MED ORDER — MORPHINE SULFATE ER 30 MG PO TBCR: 30.0000 mg | EXTENDED_RELEASE_TABLET | Freq: Two times a day (BID) | ORAL | 0 refills | Status: DC

## 2023-08-23 MED ORDER — OXYCODONE HCL 10 MG PO TABS: 10.0000 mg | ORAL_TABLET | Freq: Four times a day (QID) | ORAL | 0 refills | Status: DC | PRN

## 2023-08-23 MED ORDER — MORPHINE SULFATE ER 60 MG PO TBCR: 60.0000 mg | EXTENDED_RELEASE_TABLET | Freq: Two times a day (BID) | ORAL | 0 refills | Status: DC

## 2023-08-24 ENCOUNTER — Telehealth: Payer: Self-pay | Admitting: Registered Nurse

## 2023-08-24 DIAGNOSIS — G894 Chronic pain syndrome: Secondary | ICD-10-CM

## 2023-08-24 DIAGNOSIS — M1711 Unilateral primary osteoarthritis, right knee: Secondary | ICD-10-CM

## 2023-08-24 MED ORDER — MORPHINE SULFATE ER 30 MG PO TBCR: 30.0000 mg | EXTENDED_RELEASE_TABLET | Freq: Two times a day (BID) | ORAL | 0 refills | Status: DC

## 2023-08-24 NOTE — Telephone Encounter (Signed)
 PMP was Reviewed.  MS Contin  30 mg e-scribed to pharmacy.  Ms. Dano is aware via My-Chart

## 2023-08-24 NOTE — Telephone Encounter (Signed)
 PMP was Reviewed.  MS Contin  30 mg e-scribed to pharmacy.  [name] is aware via My-Chart

## 2023-08-28 LAB — DRUG TOX MONITOR 1 W/CONF, ORAL FLD
Amphetamines: NEGATIVE ng/mL (ref <10)
Barbiturates: NEGATIVE ng/mL (ref <10)
Benzodiazepines: NEGATIVE ng/mL (ref <0.50)
Buprenorphine: NEGATIVE ng/mL (ref <0.10)
Cocaine: NEGATIVE ng/mL (ref <5.0)
Codeine: NEGATIVE ng/mL (ref <2.5)
Cotinine: 96.4 ng/mL — ABNORMAL HIGH (ref <5.0)
Dihydrocodeine: NEGATIVE ng/mL (ref <2.5)
Fentanyl: NEGATIVE ng/mL (ref <0.10)
Heroin Metabolite: NEGATIVE ng/mL (ref <1.0)
Hydrocodone: NEGATIVE ng/mL (ref <2.5)
Hydromorphone: NEGATIVE ng/mL (ref <2.5)
MARIJUANA: NEGATIVE ng/mL (ref <2.5)
MDMA: NEGATIVE ng/mL (ref <10)
Meprobamate: NEGATIVE ng/mL (ref <2.5)
Methadone: NEGATIVE ng/mL (ref <5.0)
Morphine: 18.7 ng/mL — ABNORMAL HIGH (ref <2.5)
Nicotine Metabolite: POSITIVE ng/mL — AB (ref <5.0)
Norhydrocodone: NEGATIVE ng/mL (ref <2.5)
Noroxycodone: 35.7 ng/mL — ABNORMAL HIGH (ref <2.5)
Opiates: POSITIVE ng/mL — AB (ref <2.5)
Oxycodone: 15.4 ng/mL — ABNORMAL HIGH (ref <2.5)
Oxymorphone: NEGATIVE ng/mL (ref <2.5)
Phencyclidine: NEGATIVE ng/mL (ref <10)
Tapentadol: NEGATIVE ng/mL (ref <5.0)
Tramadol: NEGATIVE ng/mL (ref <5.0)
Zolpidem: NEGATIVE ng/mL (ref <5.0)

## 2023-08-28 LAB — DRUG TOX ALC METAB W/CON, ORAL FLD: Alcohol Metabolite: NEGATIVE ng/mL (ref <25)

## 2023-09-13 ENCOUNTER — Other Ambulatory Visit: Payer: Self-pay | Admitting: Family Medicine

## 2023-09-13 DIAGNOSIS — B351 Tinea unguium: Secondary | ICD-10-CM

## 2023-09-17 NOTE — Progress Notes (Deleted)
 Subjective:    Patient ID: [name], female    DOB: 1960/04/27, 63 y.o.   MRN: [id number]  HPI   Pain Inventory Average Pain {NUMBERS; 0-10:5044} Pain Right Now {NUMBERS; 0-10:5044} My pain is {PAIN DESCRIPTION:21022940}  In the last 24 hours, has pain interfered with the following? General activity {NUMBERS; 0-10:5044} Relation with others {NUMBERS; 0-10:5044} Enjoyment of life {NUMBERS; 0-10:5044} What TIME of day is your pain at its worst? {time of day:24191} Sleep (in general) {BHH GOOD/FAIR/POOR:22877}  Pain is worse with: {ACTIVITIES:21022942} Pain improves with: {PAIN IMPROVES TPUY:78977056} Relief from Meds: {NUMBERS; 0-10:5044}  Family History  Problem Relation Age of Onset   Depression Mother    Hypertension Mother    Anxiety disorder Mother    Obesity Mother    Varicose Veins Mother    Hypertension Father    Heart failure Father    Diabetes Father    Heart attack Father    Arthritis Father    Cancer Father    Obesity Father    Colon cancer Neg Hx    Esophageal cancer Neg Hx    Stomach cancer Neg Hx    Rectal cancer Neg Hx    Social History   Socioeconomic History   Marital status: Married    Spouse name: [name]   Number of children: 2   Years of education: some college   Highest education level: Some college, no degree  Occupational History   Occupation: On disability    Employer: UNEMPLOYED  Tobacco Use   Smoking status: Some Days    Current packs/day: 0.00    Types: Cigarettes    Last attempt to quit: 02/05/1973    Years since quitting: 50.6   Smokeless tobacco: Never  Vaping Use   Vaping status: Never Used  Substance and Sexual Activity   Alcohol  use: No   Drug use: No   Sexual activity: Not Currently    Partners: Male    Birth control/protection: None  Other Topics Concern   Not on file  Social History Narrative   On disability. Lives with her husband. She has one adopted daughter. She likes to read in her free time.  She does chair exercises for 30-45 mins everyday.   Social Drivers of Health   Financial Resource Strain: Medium Risk (01/23/2023)   Overall Financial Resource Strain (CARDIA)    Difficulty of Paying Living Expenses: Somewhat hard  Food Insecurity: No Food Insecurity (01/22/2023)   Hunger Vital Sign    Worried About Running Out of Food in the Last Year: Never true    Ran Out of Food in the Last Year: Never true  Transportation Needs: No Transportation Needs (01/23/2023)   PRAPARE - Administrator, Civil Service (Medical): No    Lack of Transportation (Non-Medical): No  Physical Activity: Unknown (01/22/2023)   Exercise Vital Sign    Days of Exercise per Week: 0 days    Minutes of Exercise per Session: Not on file  Stress: Stress Concern Present (01/23/2023)   [employer] - Occupational Stress Questionnaire    Feeling of Stress : To some extent  Social Connections: Unknown (01/23/2023)   Social Connection and Isolation Panel    Frequency of Communication with Friends and Family: Patient declined    Frequency of Social Gatherings with Friends and Family: Patient declined    Attends Religious Services: Never    Database administrator or Organizations: No    Attends Banker  Meetings: Never    Marital Status: Married   Past Surgical History:  Procedure Laterality Date   ABDOMINAL HYSTERECTOMY  06/2002   Basel Cell Carcinoma  06/2005, 07/2005   nasal tip and reconstruction   BREAST CYST ASPIRATION     CARPAL TUNNEL RELEASE  07/1993   both hands   COLONOSCOPY     COSMETIC SURGERY  Basal cell carcinoma of nose   HEMORROIDECTOMY  08/2001   JOINT REPLACEMENT  Left knee replacement   KNEE ARTHROPLASTY  09/2001, 05/2003   left knee, chondromalasia   KNEE ARTHROSCOPY  02/2003   Right knee, tisse release, chrondromalacia   REPLACEMENT TOTAL KNEE  12/2003   Left    THYROID  LOBECTOMY  01/2005   malignant area removed from right lobe    THYROID  SURGERY     thyroid  lobe removal   TONSILLECTOMY  09/1972   TUBAL LIGATION  10/1997   Ulnar nerve entrapment  03/1993   left elbow   Past Surgical History:  Procedure Laterality Date   ABDOMINAL HYSTERECTOMY  06/2002   Basel Cell Carcinoma  06/2005, 07/2005   nasal tip and reconstruction   BREAST CYST ASPIRATION     CARPAL TUNNEL RELEASE  07/1993   both hands   COLONOSCOPY     COSMETIC SURGERY  Basal cell carcinoma of nose   HEMORROIDECTOMY  08/2001   JOINT REPLACEMENT  Left knee replacement   KNEE ARTHROPLASTY  09/2001, 05/2003   left knee, chondromalasia   KNEE ARTHROSCOPY  02/2003   Right knee, tisse release, chrondromalacia   REPLACEMENT TOTAL KNEE  12/2003   Left    THYROID  LOBECTOMY  01/2005   malignant area removed from right lobe   THYROID  SURGERY     thyroid  lobe removal   TONSILLECTOMY  09/1972   TUBAL LIGATION  10/1997   Ulnar nerve entrapment  03/1993   left elbow   Past Medical History:  Diagnosis Date   Allergy    Anxiety    Cancer (HCC)    cancerous nodules on thyroid    Cancer (HCC)    basal cell Cancer-nose   Chronic pain syndrome    COPD (chronic obstructive pulmonary disease) (HCC)    Degeneration of lumbar or lumbosacral intervertebral disc    Depression    Facet syndrome, lumbar    GERD (gastroesophageal reflux disease)    Hyperlipidemia    Intervertebral lumbar disc disorder with myelopathy, lumbar region    Lumbosacral spondylosis without myelopathy    Migraine without aura, with intractable migraine, so stated, without mention of status migrainosus    Primary localized osteoarthrosis, lower leg    Thyroid  disease    hypothyroid   Unspecified musculoskeletal disorders and symptoms referable to neck    cervical/trapezius   There were no vitals taken for this visit.  Opioid Risk Score:   Fall Risk Score:  `1  Depression screen [facility] 2/9     08/23/2023    9:48 AM 07/25/2023    9:45 AM 06/29/2023    9:15 AM 05/15/2023    9:34 AM  04/18/2023    8:57 AM 03/27/2023   10:46 AM 01/23/2023    3:08 PM  Depression screen PHQ 2/9  Decreased Interest 1 1 1 1 1 3 1   Down, Depressed, Hopeless 1 1 1 1 1 3 1   PHQ - 2 Score 2 2 2 2 2 6 2   Altered sleeping      3 3  Tired, decreased energy  3 3  Change in appetite      2 1  Feeling bad or failure about yourself       2 1  Trouble concentrating      3 0  Moving slowly or fidgety/restless      0 0  Suicidal thoughts      1 0  PHQ-9 Score      20 10  Difficult doing work/chores      Somewhat difficult Somewhat difficult    Review of Systems     Objective:   Physical Exam        Assessment & Plan:

## 2023-09-18 ENCOUNTER — Encounter: Admitting: Registered Nurse

## 2023-10-03 ENCOUNTER — Encounter: Attending: Physical Medicine and Rehabilitation | Admitting: Registered Nurse

## 2023-10-03 VITALS — BP 130/85 | HR 93 | Ht 68.5 in | Wt 238.0 lb

## 2023-10-03 DIAGNOSIS — Z5181 Encounter for therapeutic drug level monitoring: Secondary | ICD-10-CM

## 2023-10-03 DIAGNOSIS — Z79891 Long term (current) use of opiate analgesic: Secondary | ICD-10-CM | POA: Diagnosis not present

## 2023-10-03 DIAGNOSIS — M2550 Pain in unspecified joint: Secondary | ICD-10-CM

## 2023-10-03 DIAGNOSIS — M7918 Myalgia, other site: Secondary | ICD-10-CM

## 2023-10-03 DIAGNOSIS — M7062 Trochanteric bursitis, left hip: Secondary | ICD-10-CM | POA: Diagnosis not present

## 2023-10-03 DIAGNOSIS — M7061 Trochanteric bursitis, right hip: Secondary | ICD-10-CM | POA: Diagnosis not present

## 2023-10-03 DIAGNOSIS — G8929 Other chronic pain: Secondary | ICD-10-CM | POA: Insufficient documentation

## 2023-10-03 DIAGNOSIS — M47816 Spondylosis without myelopathy or radiculopathy, lumbar region: Secondary | ICD-10-CM | POA: Diagnosis not present

## 2023-10-03 DIAGNOSIS — G894 Chronic pain syndrome: Secondary | ICD-10-CM

## 2023-10-03 DIAGNOSIS — M546 Pain in thoracic spine: Secondary | ICD-10-CM | POA: Diagnosis not present

## 2023-10-03 DIAGNOSIS — M1712 Unilateral primary osteoarthritis, left knee: Secondary | ICD-10-CM | POA: Diagnosis not present

## 2023-10-03 DIAGNOSIS — M1711 Unilateral primary osteoarthritis, right knee: Secondary | ICD-10-CM

## 2023-10-03 MED ORDER — MORPHINE SULFATE ER 30 MG PO TBCR: 30.0000 mg | EXTENDED_RELEASE_TABLET | Freq: Two times a day (BID) | ORAL | 0 refills | Status: DC

## 2023-10-03 MED ORDER — MORPHINE SULFATE 15 MG PO TABS: 15.0000 mg | ORAL_TABLET | Freq: Four times a day (QID) | ORAL | 0 refills | Status: DC | PRN

## 2023-10-03 MED ORDER — GABAPENTIN 800 MG PO TABS
800.0000 mg | ORAL_TABLET | Freq: Four times a day (QID) | ORAL | 5 refills | Status: DC
Start: 2023-10-03 — End: 2023-10-31

## 2023-10-03 MED ORDER — MORPHINE SULFATE ER 60 MG PO TBCR: 60.0000 mg | EXTENDED_RELEASE_TABLET | Freq: Two times a day (BID) | ORAL | 0 refills | Status: DC

## 2023-10-03 NOTE — Progress Notes (Signed)
 Subjective:    Patient ID: [name], female    DOB: [birth year], 63 y.o.   MRN: [id number]  HPI: [name] is a 63 y.o. female who returns for follow up appointment for chronic pain and medication refill.She  states her pain is located I her mid- lower back, bilaeral hips and bilateral knee pain R>L. She also reports generalized joint pain. She rates her pain 8. Her current exercise regime is walking and performing stretching exercises.  [name] Morphine  equivalent is 240.00 MME.   Last oral Swab was performed on 08/23/2023, it was consistent.    Pain Inventory Average Pain 7 Pain Right Now 8 My pain is constant, sharp, burning, dull, stabbing, tingling, and aching  In the last 24 hours, has pain interfered with the following? General activity 10 Relation with others 10 Enjoyment of life 10 What TIME of day is your pain at its worst? varies Sleep (in general) Poor  Pain is worse with: walking, bending, standing, and some activites Pain improves with: rest, heat/ice, therapy/exercise, medication, TENS, and injections Relief from Meds: 6  Family History  Problem Relation Age of Onset   Depression Mother    Hypertension Mother    Anxiety disorder Mother    Obesity Mother    Varicose Veins Mother    Hypertension Father    Heart failure Father    Diabetes Father    Heart attack Father    Arthritis Father    Cancer Father    Obesity Father    Colon cancer Neg Hx    Esophageal cancer Neg Hx    Stomach cancer Neg Hx    Rectal cancer Neg Hx    Social History   Socioeconomic History   Marital status: Married    Spouse name: [name]   Number of children: 2   Years of education: some college   Highest education level: Some college, no degree  Occupational History   Occupation: On disability    Employer: UNEMPLOYED  Tobacco Use   Smoking status: Some Days    Current packs/day: 0.00    Types: Cigarettes    Last attempt to quit: 02/05/1973     Years since quitting: 50.6   Smokeless tobacco: Never  Vaping Use   Vaping status: Never Used  Substance and Sexual Activity   Alcohol  use: No   Drug use: No   Sexual activity: Not Currently    Partners: Male    Birth control/protection: None  Other Topics Concern   Not on file  Social History Narrative   On disability. Lives with her husband. She has one adopted daughter. She likes to read in her free time. She does chair exercises for 30-45 mins everyday.   Social Drivers of Health   Financial Resource Strain: Medium Risk (01/23/2023)   Overall Financial Resource Strain (CARDIA)    Difficulty of Paying Living Expenses: Somewhat hard  Food Insecurity: No Food Insecurity (01/22/2023)   Hunger Vital Sign    Worried About Running Out of Food in the Last Year: Never true    Ran Out of Food in the Last Year: Never true  Transportation Needs: No Transportation Needs (01/23/2023)   PRAPARE - Administrator, Civil Service (Medical): No    Lack of Transportation (Non-Medical): No  Physical Activity: Unknown (01/22/2023)   Exercise Vital Sign    Days of Exercise per Week: 0 days    Minutes of Exercise per Session: Not on file  Stress:  Stress Concern Present (01/23/2023)   [employer] of Occupational Health - Occupational Stress Questionnaire    Feeling of Stress : To some extent  Social Connections: Unknown (01/23/2023)   Social Connection and Isolation Panel    Frequency of Communication with Friends and Family: Patient declined    Frequency of Social Gatherings with Friends and Family: Patient declined    Attends Religious Services: Never    Database administrator or Organizations: No    Attends Engineer, structural: Never    Marital Status: Married   Past Surgical History:  Procedure Laterality Date   ABDOMINAL HYSTERECTOMY  06/2002   Basel Cell Carcinoma  06/2005, 07/2005   nasal tip and reconstruction   BREAST CYST ASPIRATION     CARPAL TUNNEL  RELEASE  07/1993   both hands   COLONOSCOPY     COSMETIC SURGERY  Basal cell carcinoma of nose   HEMORROIDECTOMY  08/2001   JOINT REPLACEMENT  Left knee replacement   KNEE ARTHROPLASTY  09/2001, 05/2003   left knee, chondromalasia   KNEE ARTHROSCOPY  02/2003   Right knee, tisse release, chrondromalacia   REPLACEMENT TOTAL KNEE  12/2003   Left    THYROID  LOBECTOMY  01/2005   malignant area removed from right lobe   THYROID  SURGERY     thyroid  lobe removal   TONSILLECTOMY  09/1972   TUBAL LIGATION  10/1997   Ulnar nerve entrapment  03/1993   left elbow   Past Surgical History:  Procedure Laterality Date   ABDOMINAL HYSTERECTOMY  06/2002   Basel Cell Carcinoma  06/2005, 07/2005   nasal tip and reconstruction   BREAST CYST ASPIRATION     CARPAL TUNNEL RELEASE  07/1993   both hands   COLONOSCOPY     COSMETIC SURGERY  Basal cell carcinoma of nose   HEMORROIDECTOMY  08/2001   JOINT REPLACEMENT  Left knee replacement   KNEE ARTHROPLASTY  09/2001, 05/2003   left knee, chondromalasia   KNEE ARTHROSCOPY  02/2003   Right knee, tisse release, chrondromalacia   REPLACEMENT TOTAL KNEE  12/2003   Left    THYROID  LOBECTOMY  01/2005   malignant area removed from right lobe   THYROID  SURGERY     thyroid  lobe removal   TONSILLECTOMY  09/1972   TUBAL LIGATION  10/1997   Ulnar nerve entrapment  03/1993   left elbow   Past Medical History:  Diagnosis Date   Allergy    Anxiety    Cancer (HCC)    cancerous nodules on thyroid    Cancer (HCC)    basal cell Cancer-nose   Chronic pain syndrome    COPD (chronic obstructive pulmonary disease) (HCC)    Degeneration of lumbar or lumbosacral intervertebral disc    Depression    Facet syndrome, lumbar    GERD (gastroesophageal reflux disease)    Hyperlipidemia    Intervertebral lumbar disc disorder with myelopathy, lumbar region    Lumbosacral spondylosis without myelopathy    Migraine without aura, with intractable migraine, so stated,  without mention of status migrainosus    Primary localized osteoarthrosis, lower leg    Thyroid  disease    hypothyroid   Unspecified musculoskeletal disorders and symptoms referable to neck    cervical/trapezius   BP 130/85   Pulse 93   Ht 5' 8.5 (1.74 m)   Wt 238 lb (108 kg)   SpO2 94%   BMI 35.66 kg/m   Opioid Risk Score:   Fall Risk Score:  `  1  Depression screen [facility] 2/9     08/23/2023    9:48 AM 07/25/2023    9:45 AM 06/29/2023    9:15 AM 05/15/2023    9:34 AM 04/18/2023    8:57 AM 03/27/2023   10:46 AM 01/23/2023    3:08 PM  Depression screen PHQ 2/9  Decreased Interest 1 1 1 1 1 3 1   Down, Depressed, Hopeless 1 1 1 1 1 3 1   PHQ - 2 Score 2 2 2 2 2 6 2   Altered sleeping      3 3  Tired, decreased energy      3 3  Change in appetite      2 1  Feeling bad or failure about yourself       2 1  Trouble concentrating      3 0  Moving slowly or fidgety/restless      0 0  Suicidal thoughts      1 0  PHQ-9 Score      20 10  Difficult doing work/chores      Somewhat difficult Somewhat difficult      Review of Systems  Musculoskeletal:  Positive for back pain.       B/L hip pain B/L lower leg pain  All other systems reviewed and are negative.      Objective:   Physical Exam Vitals and nursing note reviewed.  Constitutional:      Appearance: Normal appearance.  Cardiovascular:     Rate and Rhythm: Normal rate and regular rhythm.     Pulses: Normal pulses.     Heart sounds: Normal heart sounds.  Pulmonary:     Effort: Pulmonary effort is normal.     Breath sounds: Normal breath sounds.  Musculoskeletal:     Comments: Normal Muscle Bulk and Muscle Testing Reveals:  Upper Extremities: Full ROM and Muscle Strength 5/5  Thoracic Paraspinal Tenderness: T-4-T-6  mainly Left Side   Lumbar Paraspinal Tenderness: L-4-L-5 Bilateral Greater Trochanter Tenderness Lower Extremities: Full ROM and Muscle Strength 5/5 Arises from table slowly using cane for support Antalgic   Gait     Skin:    General: Skin is warm and dry.  Neurological:     Mental Status: She is alert and oriented to person, place, and time.  Psychiatric:        Mood and Affect: Mood normal.        Behavior: Behavior normal.          Assessment & Plan:  1.Chronic Left Thoracic Back Pain:  Continue HEP as Tolerated. Continue to Monitor. 10/03/2023 2.  Low Back Pain/ Lumbar Facet Arthropathy: Refilled: Oxycodone   10 mg one tablet every 6 hours as needed #100 and   Refilled: MS Contin  60 mg and MS Contin   30 mg Q 12 hours to equal   90 mg  #60/60.SABRA  Continue Gabapentin  and Pamelor . 10/03/2023 We will continue the opioid monitoring program, this consists of regular clinic visits, examinations, urine drug screen, pill counts as well as use of [place]  Controlled Substance Reporting system. A 12 month History has been reviewed on the [place]  Controlled Substance Reporting System on 10/03/2023. 3. Degenerative Disk Disease:Encouraged Continue HEP as Tolertated  and heat therapy. Continue Current Medication Regime.10/03/2023 4. Bilateral Greater Trochanteric Bursitis:  No complaints today. Continue current treatment regimen with Heat and Ice Therapy. 10/03/2023 5. Osteoarthritis of Bilateral Knees: R>L Continue current treatment modality with home exercise program and heat therapy. 10/03/2023 6.  Migraine Headaches: Continue current medication regimen  Continue with  Migraine Journal. Aimovig  discontinued due to Hives. Continue Lamictal  and Relpax . Continue to Monitor.  10/03/2023 7. Smoking Cessation: She has resume smoking, she was educated on smoking cessation, she verbalizes understanding. She had quit smoking on 08/25/2016. Continue to Monitor. 10/03/2023 8.Constipation: Continue  Current medication regime with Linzess . 10/03/2023 9. Muscle Spasm: S/P Trigger Point Injection with Dr Babs on 03/07/2023.Continue current medication regime with Tizanidine .10/03/2023 10.  DepressionAnxiety/ Panic Attacks: Continue current medication regimen and treatment modality.  Celexa .Lamictal   and Counseling with Ronal Sink and  Dr. Geralene Psychiatrist. 10/03/2023 11. RSD: Continue with current medication Regime with Gabapentin . 10/03/2023 12. DeQuervain's Tenosynovitis: No complaints today. Contiue to Monitor. 10/03/2023   F/U in 1 months

## 2023-10-08 ENCOUNTER — Encounter: Payer: Self-pay | Admitting: Registered Nurse

## 2023-10-09 ENCOUNTER — Encounter: Payer: Self-pay | Admitting: Sports Medicine

## 2023-10-12 ENCOUNTER — Other Ambulatory Visit: Payer: Self-pay | Admitting: Physical Medicine & Rehabilitation

## 2023-10-16 ENCOUNTER — Encounter: Payer: Self-pay | Admitting: Physical Medicine & Rehabilitation

## 2023-10-16 ENCOUNTER — Telehealth: Payer: Self-pay | Admitting: Registered Nurse

## 2023-10-16 DIAGNOSIS — G8929 Other chronic pain: Secondary | ICD-10-CM

## 2023-10-16 DIAGNOSIS — M47816 Spondylosis without myelopathy or radiculopathy, lumbar region: Secondary | ICD-10-CM

## 2023-10-16 DIAGNOSIS — G894 Chronic pain syndrome: Secondary | ICD-10-CM

## 2023-10-16 DIAGNOSIS — M1711 Unilateral primary osteoarthritis, right knee: Secondary | ICD-10-CM

## 2023-10-16 DIAGNOSIS — M1712 Unilateral primary osteoarthritis, left knee: Secondary | ICD-10-CM

## 2023-10-16 MED ORDER — MORPHINE SULFATE ER 60 MG PO TBCR: 60.0000 mg | EXTENDED_RELEASE_TABLET | Freq: Two times a day (BID) | ORAL | 0 refills | Status: DC

## 2023-10-16 NOTE — Telephone Encounter (Signed)
 PMP was Reviewed.  MS Contin  60 mg e-scribed to pharmacy.  Placed a call to Ms. Moultry regarding the above, she verbalizes understanding.

## 2023-10-18 ENCOUNTER — Telehealth: Payer: Self-pay

## 2023-10-18 DIAGNOSIS — Z1211 Encounter for screening for malignant neoplasm of colon: Secondary | ICD-10-CM

## 2023-10-18 NOTE — Telephone Encounter (Signed)
 [name] would like to know if she can do a Cologuard instead of a colonoscopy?

## 2023-10-19 NOTE — Telephone Encounter (Signed)
 Ok for [employer]. We can order

## 2023-10-31 ENCOUNTER — Encounter: Attending: Physical Medicine and Rehabilitation | Admitting: Registered Nurse

## 2023-10-31 ENCOUNTER — Encounter: Payer: Self-pay | Admitting: Registered Nurse

## 2023-10-31 VITALS — BP 122/82 | HR 98 | Ht 68.5 in | Wt 230.2 lb

## 2023-10-31 DIAGNOSIS — M7918 Myalgia, other site: Secondary | ICD-10-CM | POA: Insufficient documentation

## 2023-10-31 DIAGNOSIS — M1711 Unilateral primary osteoarthritis, right knee: Secondary | ICD-10-CM | POA: Diagnosis not present

## 2023-10-31 DIAGNOSIS — G894 Chronic pain syndrome: Secondary | ICD-10-CM | POA: Diagnosis not present

## 2023-10-31 DIAGNOSIS — M1712 Unilateral primary osteoarthritis, left knee: Secondary | ICD-10-CM | POA: Insufficient documentation

## 2023-10-31 DIAGNOSIS — Z5181 Encounter for therapeutic drug level monitoring: Secondary | ICD-10-CM | POA: Insufficient documentation

## 2023-10-31 DIAGNOSIS — M7062 Trochanteric bursitis, left hip: Secondary | ICD-10-CM | POA: Insufficient documentation

## 2023-10-31 DIAGNOSIS — G8929 Other chronic pain: Secondary | ICD-10-CM | POA: Insufficient documentation

## 2023-10-31 DIAGNOSIS — M546 Pain in thoracic spine: Secondary | ICD-10-CM | POA: Diagnosis not present

## 2023-10-31 DIAGNOSIS — M47816 Spondylosis without myelopathy or radiculopathy, lumbar region: Secondary | ICD-10-CM | POA: Insufficient documentation

## 2023-10-31 DIAGNOSIS — M7061 Trochanteric bursitis, right hip: Secondary | ICD-10-CM | POA: Diagnosis not present

## 2023-10-31 DIAGNOSIS — Z79891 Long term (current) use of opiate analgesic: Secondary | ICD-10-CM | POA: Diagnosis not present

## 2023-10-31 DIAGNOSIS — M2550 Pain in unspecified joint: Secondary | ICD-10-CM | POA: Diagnosis not present

## 2023-10-31 MED ORDER — GABAPENTIN 800 MG PO TABS: 800.0000 mg | ORAL_TABLET | Freq: Four times a day (QID) | ORAL | 5 refills | Status: AC

## 2023-10-31 MED ORDER — MORPHINE SULFATE 15 MG PO TABS: 15.0000 mg | ORAL_TABLET | Freq: Four times a day (QID) | ORAL | 0 refills | Status: DC | PRN

## 2023-10-31 MED ORDER — MORPHINE SULFATE ER 60 MG PO TBCR: 60.0000 mg | EXTENDED_RELEASE_TABLET | Freq: Two times a day (BID) | ORAL | 0 refills | Status: DC

## 2023-10-31 MED ORDER — MORPHINE SULFATE ER 30 MG PO TBCR: 30.0000 mg | EXTENDED_RELEASE_TABLET | Freq: Two times a day (BID) | ORAL | 0 refills | Status: DC

## 2023-10-31 NOTE — Progress Notes (Signed)
 Subjective:    Patient ID: [name], female    DOB: 06/30/1960, 63 y.o.   MRN: [id number]  HPI: [name] is a 63 y.o. female who returns for follow up appointment for chronic pain and medication refill. She states her pain is located in her mid- back, bilateral hips, bilateral knee pain R>L and generalized joint pain. She rates her pain 8. Her current exercise regime is walking and performing stretching exercises.  [name] Morphine  equivalent is 180.00 MME.   Last Oral Swab was Performed on 08/23/2023, it was consistent.      Pain Inventory Average Pain 9 Pain Right Now 8 My pain is constant, sharp, burning, dull, stabbing, tingling, and aching  In the last 24 hours, has pain interfered with the following? General activity 10 Relation with others 10 Enjoyment of life 10 What TIME of day is your pain at its worst? morning , daytime, evening, and night Sleep (in general) Poor  Pain is worse with: walking, bending, standing, and some activites Pain improves with: rest, heat/ice, therapy/exercise, pacing activities, medication, TENS, and injections Relief from Meds: 6  Family History  Problem Relation Age of Onset   Depression Mother    Hypertension Mother    Anxiety disorder Mother    Obesity Mother    Varicose Veins Mother    Hypertension Father    Heart failure Father    Diabetes Father    Heart attack Father    Arthritis Father    Cancer Father    Obesity Father    Colon cancer Neg Hx    Esophageal cancer Neg Hx    Stomach cancer Neg Hx    Rectal cancer Neg Hx    Social History   Socioeconomic History   Marital status: Married    Spouse name: [name]   Number of children: 2   Years of education: some college   Highest education level: Some college, no degree  Occupational History   Occupation: On disability    Employer: UNEMPLOYED  Tobacco Use   Smoking status: Some Days    Current packs/day: 0.00    Types: Cigarettes    Last  attempt to quit: 02/05/1973    Years since quitting: 50.7   Smokeless tobacco: Never  Vaping Use   Vaping status: Never Used  Substance and Sexual Activity   Alcohol  use: No   Drug use: No   Sexual activity: Not Currently    Partners: Male    Birth control/protection: None  Other Topics Concern   Not on file  Social History Narrative   On disability. Lives with her husband. She has one adopted daughter. She likes to read in her free time. She does chair exercises for 30-45 mins everyday.   Social Drivers of Health   Financial Resource Strain: Medium Risk (01/23/2023)   Overall Financial Resource Strain (CARDIA)    Difficulty of Paying Living Expenses: Somewhat hard  Food Insecurity: No Food Insecurity (01/22/2023)   Hunger Vital Sign    Worried About Running Out of Food in the Last Year: Never true    Ran Out of Food in the Last Year: Never true  Transportation Needs: No Transportation Needs (01/23/2023)   PRAPARE - Administrator, Civil Service (Medical): No    Lack of Transportation (Non-Medical): No  Physical Activity: Unknown (01/22/2023)   Exercise Vital Sign    Days of Exercise per Week: 0 days    Minutes of Exercise per Session:  Not on file  Stress: Stress Concern Present (01/23/2023)   [employer] of Occupational Health - Occupational Stress Questionnaire    Feeling of Stress : To some extent  Social Connections: Unknown (01/23/2023)   Social Connection and Isolation Panel    Frequency of Communication with Friends and Family: Patient declined    Frequency of Social Gatherings with Friends and Family: Patient declined    Attends Religious Services: Never    Database administrator or Organizations: No    Attends Engineer, structural: Never    Marital Status: Married   Past Surgical History:  Procedure Laterality Date   ABDOMINAL HYSTERECTOMY  06/2002   Basel Cell Carcinoma  06/2005, 07/2005   nasal tip and reconstruction   BREAST CYST  ASPIRATION     CARPAL TUNNEL RELEASE  07/1993   both hands   COLONOSCOPY     COSMETIC SURGERY  Basal cell carcinoma of nose   HEMORROIDECTOMY  08/2001   JOINT REPLACEMENT  Left knee replacement   KNEE ARTHROPLASTY  09/2001, 05/2003   left knee, chondromalasia   KNEE ARTHROSCOPY  02/2003   Right knee, tisse release, chrondromalacia   REPLACEMENT TOTAL KNEE  12/2003   Left    THYROID  LOBECTOMY  01/2005   malignant area removed from right lobe   THYROID  SURGERY     thyroid  lobe removal   TONSILLECTOMY  09/1972   TUBAL LIGATION  10/1997   Ulnar nerve entrapment  03/1993   left elbow   Past Surgical History:  Procedure Laterality Date   ABDOMINAL HYSTERECTOMY  06/2002   Basel Cell Carcinoma  06/2005, 07/2005   nasal tip and reconstruction   BREAST CYST ASPIRATION     CARPAL TUNNEL RELEASE  07/1993   both hands   COLONOSCOPY     COSMETIC SURGERY  Basal cell carcinoma of nose   HEMORROIDECTOMY  08/2001   JOINT REPLACEMENT  Left knee replacement   KNEE ARTHROPLASTY  09/2001, 05/2003   left knee, chondromalasia   KNEE ARTHROSCOPY  02/2003   Right knee, tisse release, chrondromalacia   REPLACEMENT TOTAL KNEE  12/2003   Left    THYROID  LOBECTOMY  01/2005   malignant area removed from right lobe   THYROID  SURGERY     thyroid  lobe removal   TONSILLECTOMY  09/1972   TUBAL LIGATION  10/1997   Ulnar nerve entrapment  03/1993   left elbow   Past Medical History:  Diagnosis Date   Allergy    Anxiety    Cancer (HCC)    cancerous nodules on thyroid    Cancer (HCC)    basal cell Cancer-nose   Chronic pain syndrome    COPD (chronic obstructive pulmonary disease) (HCC)    Degeneration of lumbar or lumbosacral intervertebral disc    Depression    Facet syndrome, lumbar    GERD (gastroesophageal reflux disease)    Hyperlipidemia    Intervertebral lumbar disc disorder with myelopathy, lumbar region    Lumbosacral spondylosis without myelopathy    Migraine without aura, with  intractable migraine, so stated, without mention of status migrainosus    Primary localized osteoarthrosis, lower leg    Thyroid  disease    hypothyroid   Unspecified musculoskeletal disorders and symptoms referable to neck    cervical/trapezius   BP 122/82   Pulse 98   Ht 5' 8.5 (1.74 m)   Wt 230 lb 3.2 oz (104.4 kg)   SpO2 95%   BMI 34.49 kg/m   Opioid  Risk Score:   Fall Risk Score:  `1  Depression screen PHQ 2/9     10/31/2023    9:00 AM 08/23/2023    9:48 AM 07/25/2023    9:45 AM 06/29/2023    9:15 AM 05/15/2023    9:34 AM 04/18/2023    8:57 AM 03/27/2023   10:46 AM  Depression screen PHQ 2/9  Decreased Interest 1 1 1 1 1 1 3   Down, Depressed, Hopeless 1 1 1 1 1 1 3   PHQ - 2 Score 2 2 2 2 2 2 6   Altered sleeping       3  Tired, decreased energy       3  Change in appetite       2  Feeling bad or failure about yourself        2  Trouble concentrating       3  Moving slowly or fidgety/restless       0  Suicidal thoughts       1  PHQ-9 Score       20  Difficult doing work/chores       Somewhat difficult    Review of Systems  Musculoskeletal:  Positive for back pain and gait problem.       Right knee pain  All other systems reviewed and are negative.      Objective:   Physical Exam Vitals and nursing note reviewed.  Constitutional:      Appearance: Normal appearance.  Cardiovascular:     Rate and Rhythm: Normal rate and regular rhythm.  Pulmonary:     Effort: Pulmonary effort is normal.     Breath sounds: Normal breath sounds.  Musculoskeletal:     Comments: Normal Muscle Bulk and Muscle Testing Reveals:  Upper Extremities: Full ROM and Muscle Strength 5/5  Thoracic Paraspinal Tenderness: T-4- T-6  Lumbar Paraspinal Tenderness: L-4-L-5 Bilateral Greater Trochanter Tenderness Lower Extremities: Decreased ROM and Muscle Strength 5/5 Bilateral Lower Extremities Flexion Produces Pain into her Bilateral Patella's Arises from Table Slowly using cane for  support Antalgic  Gait     Skin:    General: Skin is warm and dry.  Neurological:     Mental Status: She is alert and oriented to person, place, and time.          Assessment & Plan:  1.Chronic Left Thoracic Back Pain:  Continue HEP as Tolerated. Continue to Monitor. 10/31/2023 2.  Low Back Pain/ Lumbar Facet Arthropathy: Refilled: Oxycodone   10 mg one tablet every 6 hours as needed #100 and   Refilled: MS Contin  60 mg and MS Contin   30 mg Q 12 hours to equal   90 mg  #60/60.SABRA  Continue Gabapentin  and Pamelor . 10/31/2023 We will continue the opioid monitoring program, this consists of regular clinic visits, examinations, urine drug screen, pill counts as well as use of [place]  Controlled Substance Reporting system. A 12 month History has been reviewed on the [place]  Controlled Substance Reporting System on 10/31/2023. 3. Degenerative Disk Disease:Encouraged Continue HEP as Tolertated  and heat therapy. Continue Current Medication Regime.10/31/2023 4. Bilateral Greater Trochanteric Bursitis:  No complaints today. Continue current treatment regimen with Heat and Ice Therapy. 10/31/2023 5. Osteoarthritis of Bilateral Knees: R>L Continue current treatment modality with home exercise program and heat therapy. 10/31/2023 6. Migraine Headaches: Continue current medication regimen  Continue with  Migraine Journal. Aimovig  discontinued due to Hives. Continue Lamictal  and Relpax . Continue to Monitor.  10/31/2023 7. Smoking Cessation: She  has resume smoking, she was educated on smoking cessation, she verbalizes understanding. She had quit smoking on 08/25/2016. Continue to Monitor. 10/31/2023 8.Constipation: Continue  Current medication regime with Linzess . 10/31/2023 9. Muscle Spasm: S/P Trigger Point Injection with Dr Babs on 03/07/2023.Continue current medication regime with Tizanidine .10/31/2023 10. DepressionAnxiety/ Panic Attacks: Continue current medication regimen and treatment  modality.  Celexa .Lamictal   and Counseling with Ronal Sink and  Dr. Geralene Psychiatrist. 10/31/2023 11. RSD: Continue with current medication Regime with Gabapentin . 10/31/2023 12. DeQuervain's Tenosynovitis: No complaints today. Contiue to Monitor. 10/31/2023   F/U in 1 months

## 2023-11-04 ENCOUNTER — Other Ambulatory Visit: Payer: Self-pay | Admitting: Family Medicine

## 2023-11-04 DIAGNOSIS — E1165 Type 2 diabetes mellitus with hyperglycemia: Secondary | ICD-10-CM

## 2023-11-05 NOTE — Telephone Encounter (Signed)
 Pt is overdue for DM follow up. Please call and schedule

## 2023-11-09 DIAGNOSIS — Z1211 Encounter for screening for malignant neoplasm of colon: Secondary | ICD-10-CM | POA: Diagnosis not present

## 2023-11-12 ENCOUNTER — Ambulatory Visit: Admitting: Family Medicine

## 2023-11-12 DIAGNOSIS — E039 Hypothyroidism, unspecified: Secondary | ICD-10-CM

## 2023-11-12 DIAGNOSIS — E7800 Pure hypercholesterolemia, unspecified: Secondary | ICD-10-CM

## 2023-11-12 DIAGNOSIS — E1165 Type 2 diabetes mellitus with hyperglycemia: Secondary | ICD-10-CM

## 2023-11-15 ENCOUNTER — Ambulatory Visit: Payer: Self-pay | Admitting: Family Medicine

## 2023-11-15 LAB — COLOGUARD: COLOGUARD: POSITIVE — AB

## 2023-11-15 NOTE — Progress Notes (Signed)
 Hi [name], wanted to let you know that your Cologuard came back positive which means we need to try to get you scheduled for colonoscopy.  Do you have a preference for provider or location in regards to a gastroenterologist?

## 2023-11-16 ENCOUNTER — Other Ambulatory Visit: Payer: Self-pay | Admitting: Family Medicine

## 2023-11-16 DIAGNOSIS — R195 Other fecal abnormalities: Secondary | ICD-10-CM

## 2023-11-16 NOTE — Progress Notes (Signed)
 Forwarded message to referral team to be sure that goes to patient requested location.

## 2023-11-20 ENCOUNTER — Other Ambulatory Visit: Payer: Self-pay | Admitting: Physical Medicine & Rehabilitation

## 2023-11-20 NOTE — Telephone Encounter (Signed)
 Noted and received. Thank you for the update. You're the best. Have a great day, too.

## 2023-11-20 NOTE — Telephone Encounter (Signed)
 The patient is requesting a location change for their referral. Thanks in advance.

## 2023-11-27 NOTE — Progress Notes (Unsigned)
 Subjective:    Patient ID: [name], female    DOB: 1961-01-23, 63 y.o.   MRN: [id number]  HPI: [name] is a 63 y.o. female who returns for follow up appointment for chronic pain and medication refill. She states her pain is located in her mid- back mainly left side, lower back, bilateral hips and bilateral knees R>L. Also reports bilateral lower extremities with burning sensation. He rates his pain 8. His current exercise regime is walking and performing stretching exercises.  [name] Morphine  equivalent is 230.00 MME.   Last Oral Swab was Performed on 08/23/2023, it was consistent.    Pain Inventory Average Pain 8 Pain Right Now 8 My pain is constant, sharp, burning, dull, stabbing, tingling, and aching  In the last 24 hours, has pain interfered with the following? General activity 10 Relation with others 10 Enjoyment of life 10 What TIME of day is your pain at its worst? morning , daytime, evening, and night Sleep (in general) Poor  Pain is worse with: walking, bending, and some activites Pain improves with: rest, heat/ice, therapy/exercise, pacing activities, medication, TENS, and injections Relief from Meds: 6  Family History  Problem Relation Age of Onset   Depression Mother    Hypertension Mother    Anxiety disorder Mother    Obesity Mother    Varicose Veins Mother    Hypertension Father    Heart failure Father    Diabetes Father    Heart attack Father    Arthritis Father    Cancer Father    Obesity Father    Colon cancer Neg Hx    Esophageal cancer Neg Hx    Stomach cancer Neg Hx    Rectal cancer Neg Hx    Social History   Socioeconomic History   Marital status: Married    Spouse name: [name]   Number of children: 2   Years of education: some college   Highest education level: Some college, no degree  Occupational History   Occupation: On disability    Employer: UNEMPLOYED  Tobacco Use   Smoking status: Some Days     Current packs/day: 0.00    Types: Cigarettes    Last attempt to quit: 02/05/1973    Years since quitting: 50.8   Smokeless tobacco: Never  Vaping Use   Vaping status: Never Used  Substance and Sexual Activity   Alcohol  use: No   Drug use: No   Sexual activity: Not Currently    Partners: Male    Birth control/protection: None  Other Topics Concern   Not on file  Social History Narrative   On disability. Lives with her husband. She has one adopted daughter. She likes to read in her free time. She does chair exercises for 30-45 mins everyday.   Social Drivers of Health   Financial Resource Strain: Medium Risk (01/23/2023)   Overall Financial Resource Strain (CARDIA)    Difficulty of Paying Living Expenses: Somewhat hard  Food Insecurity: No Food Insecurity (01/22/2023)   Hunger Vital Sign    Worried About Running Out of Food in the Last Year: Never true    Ran Out of Food in the Last Year: Never true  Transportation Needs: No Transportation Needs (01/23/2023)   PRAPARE - Administrator, Civil Service (Medical): No    Lack of Transportation (Non-Medical): No  Physical Activity: Unknown (01/22/2023)   Exercise Vital Sign    Days of Exercise per Week: 0 days  Minutes of Exercise per Session: Not on file  Stress: Stress Concern Present (01/23/2023)   [employer] of Occupational Health - Occupational Stress Questionnaire    Feeling of Stress : To some extent  Social Connections: Unknown (01/23/2023)   Social Connection and Isolation Panel    Frequency of Communication with Friends and Family: Patient declined    Frequency of Social Gatherings with Friends and Family: Patient declined    Attends Religious Services: Never    Database administrator or Organizations: No    Attends Engineer, structural: Never    Marital Status: Married   Past Surgical History:  Procedure Laterality Date   ABDOMINAL HYSTERECTOMY  06/2002   Basel Cell Carcinoma  06/2005,  07/2005   nasal tip and reconstruction   BREAST CYST ASPIRATION     CARPAL TUNNEL RELEASE  07/1993   both hands   COLONOSCOPY     COSMETIC SURGERY  Basal cell carcinoma of nose   HEMORROIDECTOMY  08/2001   JOINT REPLACEMENT  Left knee replacement   KNEE ARTHROPLASTY  09/2001, 05/2003   left knee, chondromalasia   KNEE ARTHROSCOPY  02/2003   Right knee, tisse release, chrondromalacia   REPLACEMENT TOTAL KNEE  12/2003   Left    THYROID  LOBECTOMY  01/2005   malignant area removed from right lobe   THYROID  SURGERY     thyroid  lobe removal   TONSILLECTOMY  09/1972   TUBAL LIGATION  10/1997   Ulnar nerve entrapment  03/1993   left elbow   Past Surgical History:  Procedure Laterality Date   ABDOMINAL HYSTERECTOMY  06/2002   Basel Cell Carcinoma  06/2005, 07/2005   nasal tip and reconstruction   BREAST CYST ASPIRATION     CARPAL TUNNEL RELEASE  07/1993   both hands   COLONOSCOPY     COSMETIC SURGERY  Basal cell carcinoma of nose   HEMORROIDECTOMY  08/2001   JOINT REPLACEMENT  Left knee replacement   KNEE ARTHROPLASTY  09/2001, 05/2003   left knee, chondromalasia   KNEE ARTHROSCOPY  02/2003   Right knee, tisse release, chrondromalacia   REPLACEMENT TOTAL KNEE  12/2003   Left    THYROID  LOBECTOMY  01/2005   malignant area removed from right lobe   THYROID  SURGERY     thyroid  lobe removal   TONSILLECTOMY  09/1972   TUBAL LIGATION  10/1997   Ulnar nerve entrapment  03/1993   left elbow   Past Medical History:  Diagnosis Date   Allergy    Anxiety    Cancer (HCC)    cancerous nodules on thyroid    Cancer (HCC)    basal cell Cancer-nose   Chronic pain syndrome    COPD (chronic obstructive pulmonary disease) (HCC)    Degeneration of lumbar or lumbosacral intervertebral disc    Depression    Facet syndrome, lumbar    GERD (gastroesophageal reflux disease)    Hyperlipidemia    Intervertebral lumbar disc disorder with myelopathy, lumbar region    Lumbosacral spondylosis  without myelopathy    Migraine without aura, with intractable migraine, so stated, without mention of status migrainosus    Primary localized osteoarthrosis, lower leg    Thyroid  disease    hypothyroid   Unspecified musculoskeletal disorders and symptoms referable to neck    cervical/trapezius   There were no vitals taken for this visit.  Opioid Risk Score:   Fall Risk Score:  `1  Depression screen [facility] 2/9     10/31/2023  9:00 AM 08/23/2023    9:48 AM 07/25/2023    9:45 AM 06/29/2023    9:15 AM 05/15/2023    9:34 AM 04/18/2023    8:57 AM 03/27/2023   10:46 AM  Depression screen PHQ 2/9  Decreased Interest 1 1 1 1 1 1 3   Down, Depressed, Hopeless 1 1 1 1 1 1 3   PHQ - 2 Score 2 2 2 2 2 2 6   Altered sleeping       3  Tired, decreased energy       3  Change in appetite       2  Feeling bad or failure about yourself        2  Trouble concentrating       3  Moving slowly or fidgety/restless       0  Suicidal thoughts       1  PHQ-9 Score       20  Difficult doing work/chores       Somewhat difficult    Review of Systems  Musculoskeletal:  Positive for back pain, gait problem and neck pain.       Pain in both legs and hips  All other systems reviewed and are negative.      Objective:   Physical Exam Vitals and nursing note reviewed.  Constitutional:      Appearance: Normal appearance.  Cardiovascular:     Rate and Rhythm: Normal rate and regular rhythm.     Pulses: Normal pulses.     Heart sounds: Normal heart sounds.  Pulmonary:     Effort: Pulmonary effort is normal.     Breath sounds: Normal breath sounds.  Musculoskeletal:     Comments: Normal Muscle Bulk and Muscle Testing Reveals:  Upper Extremities:Full  ROM and Muscle Strength 5/5  Thoracic Paraspinal Tenderness: T-4-T-6 Mainly Left Side Lumbar Paraspinal Tenderness: L-3-L-5 Bilateral Greater Trochanter Tenderness Lower Extremities: Right: Decreased ROM and Muscle Strength 5/5 Right Lower Extremity Flexion  Produces Pain into her Right Patella Left Lower Extremity: Full ROM and Muscle Strength 5/5 Left Lower Extremity Flexion Produces Pain into her Left Patella Arises from Table slowly using cane for support Antalgic  Gait     Skin:    General: Skin is warm and dry.  Neurological:     Mental Status: She is alert and oriented to person, place, and time.  Psychiatric:        Mood and Affect: Mood normal.        Behavior: Behavior normal.          Assessment & Plan:  1.Chronic Left Thoracic Back Pain:  Continue HEP as Tolerated. Continue to Monitor. 11/28/2023 2.  Low Back Pain/ Lumbar Facet Arthropathy: Refilled: MSIR 15 mg one tablet every 6 hours as needed #100 and  MS Contin  60 mg and MS Contin   30 mg Q 12 hours to equal   90 mg  #60/60.SABRA  Continue Gabapentin  and Pamelor . 11/28/2023 We will continue the opioid monitoring program, this consists of regular clinic visits, examinations, urine drug screen, pill counts as well as use of [place]  Controlled Substance Reporting system. A 12 month History has been reviewed on the [place]  Controlled Substance Reporting System on 11/28/2023. 3. Degenerative Disk Disease:Encouraged Continue HEP as Tolertated  and heat therapy. Continue Current Medication Regime.11/28/2023 4. Bilateral Greater Trochanteric Bursitis:  No complaints today. Continue current treatment regimen with Heat and Ice Therapy. 11/28/2023 5. Osteoarthritis of Bilateral Knees: R>L Continue current  treatment modality with home exercise program and heat therapy. 11/28/2023 6. Migraine Headaches: Continue current medication regimen  Continue with  Migraine Journal. Aimovig  discontinued due to Hives. Continue Lamictal  and Relpax . Continue to Monitor.  11/28/2023 7. Smoking Cessation: She has resume smoking, she was educated on smoking cessation, she verbalizes understanding. She had quit smoking on 08/25/2016. Continue to Monitor. 11/28/2023 8.Constipation: Continue  Current  medication regime with Linzess . 11/28/2023 9. Muscle Spasm: S/P Trigger Point Injection with Dr Babs on 03/07/2023.Continue current medication regime with Tizanidine .11/28/2023 10. DepressionAnxiety/ Panic Attacks: Continue current medication regimen and treatment modality.  Celexa .Lamictal   and Counseling with Ronal Sink and  Dr. Geralene Psychiatrist. 11/28/2023 11. RSD: Continue with current medication Regime with Gabapentin . 11/28/2023 12. DeQuervain's Tenosynovitis: No complaints today. Contiue to Monitor. 11/28/2023 13. Polyneuropathy: Continue Gabapentin . Continue to monitor.   F/U in 1 months

## 2023-11-28 ENCOUNTER — Encounter: Attending: Physical Medicine and Rehabilitation | Admitting: Registered Nurse

## 2023-11-28 ENCOUNTER — Encounter: Payer: Self-pay | Admitting: Registered Nurse

## 2023-11-28 VITALS — BP 111/75 | HR 90 | Ht 68.5 in | Wt 236.0 lb

## 2023-11-28 DIAGNOSIS — G8929 Other chronic pain: Secondary | ICD-10-CM | POA: Insufficient documentation

## 2023-11-28 DIAGNOSIS — Z5181 Encounter for therapeutic drug level monitoring: Secondary | ICD-10-CM | POA: Diagnosis not present

## 2023-11-28 DIAGNOSIS — M2550 Pain in unspecified joint: Secondary | ICD-10-CM | POA: Diagnosis not present

## 2023-11-28 DIAGNOSIS — M7918 Myalgia, other site: Secondary | ICD-10-CM | POA: Diagnosis not present

## 2023-11-28 DIAGNOSIS — M7062 Trochanteric bursitis, left hip: Secondary | ICD-10-CM | POA: Insufficient documentation

## 2023-11-28 DIAGNOSIS — M1712 Unilateral primary osteoarthritis, left knee: Secondary | ICD-10-CM | POA: Insufficient documentation

## 2023-11-28 DIAGNOSIS — M7061 Trochanteric bursitis, right hip: Secondary | ICD-10-CM | POA: Insufficient documentation

## 2023-11-28 DIAGNOSIS — Z79891 Long term (current) use of opiate analgesic: Secondary | ICD-10-CM | POA: Diagnosis not present

## 2023-11-28 DIAGNOSIS — M546 Pain in thoracic spine: Secondary | ICD-10-CM | POA: Diagnosis not present

## 2023-11-28 DIAGNOSIS — G629 Polyneuropathy, unspecified: Secondary | ICD-10-CM | POA: Insufficient documentation

## 2023-11-28 DIAGNOSIS — G894 Chronic pain syndrome: Secondary | ICD-10-CM | POA: Diagnosis not present

## 2023-11-28 DIAGNOSIS — M47816 Spondylosis without myelopathy or radiculopathy, lumbar region: Secondary | ICD-10-CM | POA: Insufficient documentation

## 2023-11-28 DIAGNOSIS — M1711 Unilateral primary osteoarthritis, right knee: Secondary | ICD-10-CM | POA: Diagnosis not present

## 2023-11-28 MED ORDER — MORPHINE SULFATE ER 60 MG PO TBCR: 60.0000 mg | EXTENDED_RELEASE_TABLET | Freq: Two times a day (BID) | ORAL | 0 refills | Status: DC

## 2023-11-28 MED ORDER — MORPHINE SULFATE ER 30 MG PO TBCR: 30.0000 mg | EXTENDED_RELEASE_TABLET | Freq: Two times a day (BID) | ORAL | 0 refills | Status: AC

## 2023-11-28 MED ORDER — MORPHINE SULFATE 15 MG PO TABS: 15.0000 mg | ORAL_TABLET | Freq: Four times a day (QID) | ORAL | 0 refills | Status: DC | PRN

## 2023-12-02 LAB — DRUG TOX MONITOR 1 W/CONF, ORAL FLD
Amphetamines: NEGATIVE ng/mL (ref <10)
Barbiturates: NEGATIVE ng/mL (ref <10)
Benzodiazepines: NEGATIVE ng/mL (ref <0.50)
Buprenorphine: NEGATIVE ng/mL (ref <0.10)
Cocaine: NEGATIVE ng/mL (ref <5.0)
Codeine: NEGATIVE ng/mL (ref <2.5)
Cotinine: 50.7 ng/mL — ABNORMAL HIGH (ref <5.0)
Dihydrocodeine: NEGATIVE ng/mL (ref <2.5)
Fentanyl: NEGATIVE ng/mL (ref <0.10)
Heroin Metabolite: NEGATIVE ng/mL (ref <1.0)
Hydrocodone: NEGATIVE ng/mL (ref <2.5)
Hydromorphone: NEGATIVE ng/mL (ref <2.5)
MARIJUANA: NEGATIVE ng/mL (ref <2.5)
MDMA: NEGATIVE ng/mL (ref <10)
Meprobamate: NEGATIVE ng/mL (ref <2.5)
Methadone: NEGATIVE ng/mL (ref <5.0)
Morphine: 59.9 ng/mL — ABNORMAL HIGH (ref <2.5)
Nicotine Metabolite: POSITIVE ng/mL — AB (ref <5.0)
Norhydrocodone: NEGATIVE ng/mL (ref <2.5)
Noroxycodone: NEGATIVE ng/mL (ref <2.5)
Opiates: POSITIVE ng/mL — AB (ref <2.5)
Oxycodone: NEGATIVE ng/mL (ref <2.5)
Oxymorphone: NEGATIVE ng/mL (ref <2.5)
Phencyclidine: NEGATIVE ng/mL (ref <10)
Tapentadol: NEGATIVE ng/mL (ref <5.0)
Tramadol: NEGATIVE ng/mL (ref <5.0)
Zolpidem: NEGATIVE ng/mL (ref <5.0)

## 2023-12-02 LAB — DRUG TOX ALC METAB W/CON, ORAL FLD: Alcohol Metabolite: NEGATIVE ng/mL (ref <25)

## 2023-12-03 ENCOUNTER — Other Ambulatory Visit: Payer: Self-pay | Admitting: Family Medicine

## 2023-12-03 DIAGNOSIS — E1165 Type 2 diabetes mellitus with hyperglycemia: Secondary | ICD-10-CM

## 2023-12-05 NOTE — Telephone Encounter (Signed)
 Call pt she will need an appointment for DM and refills. TY

## 2023-12-26 ENCOUNTER — Encounter: Attending: Physical Medicine and Rehabilitation | Admitting: Registered Nurse

## 2023-12-26 ENCOUNTER — Encounter: Payer: Self-pay | Admitting: Registered Nurse

## 2023-12-26 VITALS — BP 136/73 | HR 91 | Ht 68.5 in | Wt 236.0 lb

## 2023-12-26 DIAGNOSIS — M7062 Trochanteric bursitis, left hip: Secondary | ICD-10-CM | POA: Insufficient documentation

## 2023-12-26 DIAGNOSIS — M2550 Pain in unspecified joint: Secondary | ICD-10-CM | POA: Insufficient documentation

## 2023-12-26 DIAGNOSIS — M7918 Myalgia, other site: Secondary | ICD-10-CM | POA: Diagnosis present

## 2023-12-26 DIAGNOSIS — G629 Polyneuropathy, unspecified: Secondary | ICD-10-CM | POA: Insufficient documentation

## 2023-12-26 DIAGNOSIS — M1712 Unilateral primary osteoarthritis, left knee: Secondary | ICD-10-CM | POA: Diagnosis present

## 2023-12-26 DIAGNOSIS — M546 Pain in thoracic spine: Secondary | ICD-10-CM | POA: Diagnosis not present

## 2023-12-26 DIAGNOSIS — M1711 Unilateral primary osteoarthritis, right knee: Secondary | ICD-10-CM | POA: Diagnosis not present

## 2023-12-26 DIAGNOSIS — Z79891 Long term (current) use of opiate analgesic: Secondary | ICD-10-CM | POA: Diagnosis present

## 2023-12-26 DIAGNOSIS — G8929 Other chronic pain: Secondary | ICD-10-CM | POA: Diagnosis present

## 2023-12-26 DIAGNOSIS — Z5181 Encounter for therapeutic drug level monitoring: Secondary | ICD-10-CM | POA: Diagnosis present

## 2023-12-26 DIAGNOSIS — M47816 Spondylosis without myelopathy or radiculopathy, lumbar region: Secondary | ICD-10-CM | POA: Insufficient documentation

## 2023-12-26 DIAGNOSIS — M7061 Trochanteric bursitis, right hip: Secondary | ICD-10-CM | POA: Insufficient documentation

## 2023-12-26 DIAGNOSIS — G894 Chronic pain syndrome: Secondary | ICD-10-CM | POA: Insufficient documentation

## 2023-12-26 MED ORDER — MORPHINE SULFATE ER 30 MG PO TBCR: 30.0000 mg | EXTENDED_RELEASE_TABLET | Freq: Two times a day (BID) | ORAL | 0 refills | Status: DC

## 2023-12-26 MED ORDER — MORPHINE SULFATE 15 MG PO TABS: 15.0000 mg | ORAL_TABLET | Freq: Four times a day (QID) | ORAL | 0 refills | Status: DC | PRN

## 2023-12-26 MED ORDER — MORPHINE SULFATE ER 60 MG PO TBCR: 60.0000 mg | EXTENDED_RELEASE_TABLET | Freq: Two times a day (BID) | ORAL | 0 refills | Status: DC

## 2023-12-26 MED ORDER — MORPHINE SULFATE ER 30 MG PO TBCR
30.0000 mg | EXTENDED_RELEASE_TABLET | Freq: Two times a day (BID) | ORAL | 0 refills | Status: DC
Start: 2023-12-26 — End: 2023-12-26

## 2023-12-26 NOTE — Progress Notes (Signed)
 Subjective:    Patient ID: FEY COGHILL, female    DOB: Dec 28, 1960, 63 y.o.   MRN: [id number]  HPI: [name] is a 63 y.o. female who returns for follow up appointment for chronic pain and medication refill. She states her pain is located in her left- mid back, lower back, bilateral hip pain and bilateral knee pain R>L. Also reports bilateral hands with tingling.  [name] reports she has a migraine, onset was 12/23/2023, she is compliant with her medication she reports.   She rates her pain 8. Her current exercise regime is walking and performing stretching exercises.  [name] Morphine  equivalent is 240.00 MME.   Last Oral Swab was Performed on 11/28/2023, it was consistent.      Pain Inventory Average Pain 8 Pain Right Now 8 My pain is constant, sharp, burning, dull, stabbing, tingling, and aching  In the last 24 hours, has pain interfered with the following? General activity 10 Relation with others 10 Enjoyment of life 10 What TIME of day is your pain at its worst? morning , daytime, evening, and night Sleep (in general) Poor  Pain is worse with: walking, bending, sitting, standing, and some activites Pain improves with: rest, heat/ice, therapy/exercise, pacing activities, medication, TENS, and injections Relief from Meds: 6  Family History  Problem Relation Age of Onset   Depression Mother    Hypertension Mother    Anxiety disorder Mother    Obesity Mother    Varicose Veins Mother    Hypertension Father    Heart failure Father    Diabetes Father    Heart attack Father    Arthritis Father    Cancer Father    Obesity Father    Colon cancer Neg Hx    Esophageal cancer Neg Hx    Stomach cancer Neg Hx    Rectal cancer Neg Hx    Social History   Socioeconomic History   Marital status: Married    Spouse name: [name]   Number of children: 2   Years of education: some college   Highest education level: Some college, no degree   Occupational History   Occupation: On disability    Employer: UNEMPLOYED  Tobacco Use   Smoking status: Some Days    Current packs/day: 0.00    Types: Cigarettes    Last attempt to quit: 02/05/1973    Years since quitting: 50.9   Smokeless tobacco: Never  Vaping Use   Vaping status: Never Used  Substance and Sexual Activity   Alcohol  use: No   Drug use: No   Sexual activity: Not Currently    Partners: Male    Birth control/protection: None  Other Topics Concern   Not on file  Social History Narrative   On disability. Lives with her husband. She has one adopted daughter. She likes to read in her free time. She does chair exercises for 30-45 mins everyday.   Social Drivers of Health   Financial Resource Strain: Medium Risk (01/23/2023)   Overall Financial Resource Strain (CARDIA)    Difficulty of Paying Living Expenses: Somewhat hard  Food Insecurity: No Food Insecurity (01/22/2023)   Hunger Vital Sign    Worried About Running Out of Food in the Last Year: Never true    Ran Out of Food in the Last Year: Never true  Transportation Needs: No Transportation Needs (01/23/2023)   PRAPARE - Administrator, Civil Service (Medical): No    Lack of Transportation (Non-Medical):  No  Physical Activity: Unknown (01/22/2023)   Exercise Vital Sign    Days of Exercise per Week: 0 days    Minutes of Exercise per Session: Not on file  Stress: Stress Concern Present (01/23/2023)   [employer] of Occupational Health - Occupational Stress Questionnaire    Feeling of Stress : To some extent  Social Connections: Unknown (01/23/2023)   Social Connection and Isolation Panel    Frequency of Communication with Friends and Family: Patient declined    Frequency of Social Gatherings with Friends and Family: Patient declined    Attends Religious Services: Never    Database Administrator or Organizations: No    Attends Engineer, Structural: Never    Marital Status: Married    Past Surgical History:  Procedure Laterality Date   ABDOMINAL HYSTERECTOMY  06/2002   Basel Cell Carcinoma  06/2005, 07/2005   nasal tip and reconstruction   BREAST CYST ASPIRATION     CARPAL TUNNEL RELEASE  07/1993   both hands   COLONOSCOPY     COSMETIC SURGERY  Basal cell carcinoma of nose   HEMORROIDECTOMY  08/2001   JOINT REPLACEMENT  Left knee replacement   KNEE ARTHROPLASTY  09/2001, 05/2003   left knee, chondromalasia   KNEE ARTHROSCOPY  02/2003   Right knee, tisse release, chrondromalacia   REPLACEMENT TOTAL KNEE  12/2003   Left    THYROID  LOBECTOMY  01/2005   malignant area removed from right lobe   THYROID  SURGERY     thyroid  lobe removal   TONSILLECTOMY  09/1972   TUBAL LIGATION  10/1997   Ulnar nerve entrapment  03/1993   left elbow   Past Surgical History:  Procedure Laterality Date   ABDOMINAL HYSTERECTOMY  06/2002   Basel Cell Carcinoma  06/2005, 07/2005   nasal tip and reconstruction   BREAST CYST ASPIRATION     CARPAL TUNNEL RELEASE  07/1993   both hands   COLONOSCOPY     COSMETIC SURGERY  Basal cell carcinoma of nose   HEMORROIDECTOMY  08/2001   JOINT REPLACEMENT  Left knee replacement   KNEE ARTHROPLASTY  09/2001, 05/2003   left knee, chondromalasia   KNEE ARTHROSCOPY  02/2003   Right knee, tisse release, chrondromalacia   REPLACEMENT TOTAL KNEE  12/2003   Left    THYROID  LOBECTOMY  01/2005   malignant area removed from right lobe   THYROID  SURGERY     thyroid  lobe removal   TONSILLECTOMY  09/1972   TUBAL LIGATION  10/1997   Ulnar nerve entrapment  03/1993   left elbow   Past Medical History:  Diagnosis Date   Allergy    Anxiety    Cancer (HCC)    cancerous nodules on thyroid    Cancer (HCC)    basal cell Cancer-nose   Chronic pain syndrome    COPD (chronic obstructive pulmonary disease) (HCC)    Degeneration of lumbar or lumbosacral intervertebral disc    Depression    Facet syndrome, lumbar    GERD (gastroesophageal reflux disease)     Hyperlipidemia    Intervertebral lumbar disc disorder with myelopathy, lumbar region    Lumbosacral spondylosis without myelopathy    Migraine without aura, with intractable migraine, so stated, without mention of status migrainosus    Primary localized osteoarthrosis, lower leg    Thyroid  disease    hypothyroid   Unspecified musculoskeletal disorders and symptoms referable to neck    cervical/trapezius   There were no vitals taken  for this visit.  Opioid Risk Score:   Fall Risk Score:  `1  Depression screen [facility] 2/9     11/28/2023    8:42 AM 10/31/2023    9:00 AM 08/23/2023    9:48 AM 07/25/2023    9:45 AM 06/29/2023    9:15 AM 05/15/2023    9:34 AM 04/18/2023    8:57 AM  Depression screen PHQ 2/9  Decreased Interest 1 1 1 1 1 1 1   Down, Depressed, Hopeless 1 1 1 1 1 1 1   PHQ - 2 Score 2 2 2 2 2 2 2     Review of Systems  Musculoskeletal:  Positive for back pain and gait problem.       Pain in both hips, both knees  Neurological:  Positive for headaches.  All other systems reviewed and are negative.      Objective:   Physical Exam Vitals and nursing note reviewed.  Constitutional:      Appearance: Normal appearance.  Cardiovascular:     Rate and Rhythm: Normal rate and regular rhythm.     Pulses: Normal pulses.     Heart sounds: Normal heart sounds.  Pulmonary:     Effort: Pulmonary effort is normal.     Breath sounds: Normal breath sounds.  Musculoskeletal:     Comments: Normal Muscle Bulk and Muscle Testing Reveals:  Upper Extremities: Full ROM and Muscle Strength 5/5  Thoracic Paraspinal Tenderness: T-4-T-6 Mainly Left Side  Lumbar Paraspinal Tenderness: L-4-L-5 Bilateral Greater Trochanter Tenderness Lower Extremities: Decreased ROM and Muscle Strength 5/5 Bilateral Lower extremities Flexion Produces Pain into her Bilateral Patella's Arises from Table slowly using cane for support Antalgic  Gait     Skin:    General: Skin is warm and dry.  Neurological:      Mental Status: She is alert and oriented to person, place, and time.  Psychiatric:        Mood and Affect: Mood normal.        Behavior: Behavior normal.          Assessment & Plan:  1.Chronic Left Thoracic Back Pain:  Continue HEP as Tolerated. Continue to Monitor. 12/26/2023 2.  Low Back Pain/ Lumbar Facet Arthropathy: Refilled: MSIR 15 mg one tablet every 6 hours as needed #100 and  MS Contin  60 mg and MS Contin   30 mg Q 12 hours to equal   90 mg  #60/60.SABRA  Continue Gabapentin  and Pamelor . 12/26/2023 We will continue the opioid monitoring program, this consists of regular clinic visits, examinations, urine drug screen, pill counts as well as use of [place]  Controlled Substance Reporting system. A 12 month History has been reviewed on the [place]  Controlled Substance Reporting System on 12/26/2023. 3. Degenerative Disk Disease:Encouraged Continue HEP as Tolertated  and heat therapy. Continue Current Medication Regime.12/26/2023 4. Bilateral Greater Trochanteric Bursitis:  No complaints today. Continue current treatment regimen with Heat and Ice Therapy. 12/26/2023 5. Osteoarthritis of Bilateral Knees: R>L Continue current treatment modality with home exercise program and heat therapy. 12/26/2023 6. Migraine Headaches: Continue current medication regimen  Continue with  Migraine Journal. Aimovig  discontinued due to Hives. Continue Lamictal  and Relpax . Continue to Monitor.  12/26/2023 7. Smoking Cessation: She has resume smoking, she was educated on smoking cessation, she verbalizes understanding. She had quit smoking on 08/25/2016. Continue to Monitor. 12/26/2023 8.Constipation: Continue  Current medication regime with Linzess . 12/26/2023 9. Muscle Spasm: S/P Trigger Point Injection with Dr Babs on 03/07/2023.Continue current medication regime with  Tizanidine .12/26/2023 10. DepressionAnxiety/ Panic Attacks: Continue current medication regimen and treatment modality.   Celexa .Lamictal   and Counseling with Ronal Sink and  Dr. Geralene Psychiatrist. 12/26/2023 11. RSD: Continue with current medication Regime with Gabapentin . 12/26/2023 12. DeQuervain's Tenosynovitis: No complaints today. Contiue to Monitor. 12/26/2023 13. Polyneuropathy: Continue Gabapentin . Continue to monitor. 12/26/2023   F/U in 1 month

## 2023-12-31 ENCOUNTER — Other Ambulatory Visit: Payer: Self-pay | Admitting: Family Medicine

## 2023-12-31 DIAGNOSIS — E1165 Type 2 diabetes mellitus with hyperglycemia: Secondary | ICD-10-CM

## 2024-01-01 ENCOUNTER — Other Ambulatory Visit: Payer: Self-pay | Admitting: Family Medicine

## 2024-01-01 DIAGNOSIS — B351 Tinea unguium: Secondary | ICD-10-CM

## 2024-01-08 ENCOUNTER — Other Ambulatory Visit: Payer: Self-pay | Admitting: *Deleted

## 2024-01-08 DIAGNOSIS — B351 Tinea unguium: Secondary | ICD-10-CM

## 2024-01-08 MED ORDER — TERBINAFINE HCL 250 MG PO TABS: 250.0000 mg | ORAL_TABLET | Freq: Every day | ORAL | 0 refills | Status: AC

## 2024-01-10 ENCOUNTER — Ambulatory Visit: Admitting: Family Medicine

## 2024-01-15 ENCOUNTER — Other Ambulatory Visit: Payer: Self-pay | Admitting: Family Medicine

## 2024-01-15 DIAGNOSIS — E1165 Type 2 diabetes mellitus with hyperglycemia: Secondary | ICD-10-CM

## 2024-01-16 ENCOUNTER — Encounter: Payer: Self-pay | Admitting: Family Medicine

## 2024-01-16 DIAGNOSIS — E1165 Type 2 diabetes mellitus with hyperglycemia: Secondary | ICD-10-CM

## 2024-01-16 MED ORDER — DAPAGLIFLOZIN PRO-METFORMIN ER 10-1000 MG PO TB24: 1.0000 | ORAL_TABLET | Freq: Every day | ORAL | 0 refills | Status: DC

## 2024-01-20 ENCOUNTER — Other Ambulatory Visit: Payer: Self-pay | Admitting: Registered Nurse

## 2024-01-20 ENCOUNTER — Other Ambulatory Visit: Payer: Self-pay | Admitting: Family Medicine

## 2024-01-20 DIAGNOSIS — E039 Hypothyroidism, unspecified: Secondary | ICD-10-CM

## 2024-01-21 ENCOUNTER — Telehealth: Payer: Self-pay | Admitting: Registered Nurse

## 2024-01-21 ENCOUNTER — Other Ambulatory Visit: Payer: Self-pay | Admitting: *Deleted

## 2024-01-21 DIAGNOSIS — G8929 Other chronic pain: Secondary | ICD-10-CM

## 2024-01-21 DIAGNOSIS — G894 Chronic pain syndrome: Secondary | ICD-10-CM

## 2024-01-21 DIAGNOSIS — M1712 Unilateral primary osteoarthritis, left knee: Secondary | ICD-10-CM

## 2024-01-21 DIAGNOSIS — M47816 Spondylosis without myelopathy or radiculopathy, lumbar region: Secondary | ICD-10-CM

## 2024-01-21 DIAGNOSIS — M1711 Unilateral primary osteoarthritis, right knee: Secondary | ICD-10-CM

## 2024-01-21 MED ORDER — PRAVASTATIN SODIUM 40 MG PO TABS: 40.0000 mg | ORAL_TABLET | Freq: Every day | ORAL | 0 refills | Status: AC

## 2024-01-21 NOTE — Telephone Encounter (Signed)
 Requested Prescriptions   Pending Prescriptions Disp Refills   morphine  (MS CONTIN ) 30 MG 12 hr tablet 60 tablet 0    Sig: Take 1 tablet (30 mg total) by mouth every 12 (twelve) hours. Do Not Fill before 01/26/2024   morphine  (MSIR) 15 MG tablet 100 tablet 0    Sig: Take 1 tablet (15 mg total) by mouth every 6 (six) hours as needed for severe pain (pain score 7-10).     Date of patient request: 01/21/2024 Last office visit: 12/26/2023 Upcoming visit: 01/29/2024 Date of last refill: 12/26/2023 Last refill amount: #60 and #100

## 2024-01-21 NOTE — Telephone Encounter (Signed)
 PDPM  was reviewed.  [facility] was called spoke with pharmacist .  [place] Ms. Bohnet regarding the above, she verbalizes understanding.

## 2024-01-29 ENCOUNTER — Encounter: Attending: Physical Medicine and Rehabilitation | Admitting: Registered Nurse

## 2024-01-29 VITALS — BP 117/68 | HR 94 | Ht 68.5 in | Wt 237.0 lb

## 2024-01-29 DIAGNOSIS — M2550 Pain in unspecified joint: Secondary | ICD-10-CM | POA: Diagnosis present

## 2024-01-29 DIAGNOSIS — M546 Pain in thoracic spine: Secondary | ICD-10-CM | POA: Diagnosis not present

## 2024-01-29 DIAGNOSIS — G894 Chronic pain syndrome: Secondary | ICD-10-CM | POA: Diagnosis present

## 2024-01-29 DIAGNOSIS — M1711 Unilateral primary osteoarthritis, right knee: Secondary | ICD-10-CM | POA: Insufficient documentation

## 2024-01-29 DIAGNOSIS — M7062 Trochanteric bursitis, left hip: Secondary | ICD-10-CM | POA: Insufficient documentation

## 2024-01-29 DIAGNOSIS — M47816 Spondylosis without myelopathy or radiculopathy, lumbar region: Secondary | ICD-10-CM | POA: Diagnosis not present

## 2024-01-29 DIAGNOSIS — M1712 Unilateral primary osteoarthritis, left knee: Secondary | ICD-10-CM | POA: Diagnosis not present

## 2024-01-29 DIAGNOSIS — G629 Polyneuropathy, unspecified: Secondary | ICD-10-CM | POA: Diagnosis present

## 2024-01-29 DIAGNOSIS — Z5181 Encounter for therapeutic drug level monitoring: Secondary | ICD-10-CM | POA: Diagnosis present

## 2024-01-29 DIAGNOSIS — M7061 Trochanteric bursitis, right hip: Secondary | ICD-10-CM | POA: Diagnosis present

## 2024-01-29 DIAGNOSIS — G8929 Other chronic pain: Secondary | ICD-10-CM | POA: Insufficient documentation

## 2024-01-29 DIAGNOSIS — M7918 Myalgia, other site: Secondary | ICD-10-CM | POA: Diagnosis present

## 2024-01-29 DIAGNOSIS — Z79891 Long term (current) use of opiate analgesic: Secondary | ICD-10-CM | POA: Insufficient documentation

## 2024-01-29 MED ORDER — MORPHINE SULFATE ER 60 MG PO TBCR: 60.0000 mg | EXTENDED_RELEASE_TABLET | Freq: Two times a day (BID) | ORAL | 0 refills | Status: DC

## 2024-01-29 MED ORDER — MORPHINE SULFATE 15 MG PO TABS: 15.0000 mg | ORAL_TABLET | Freq: Four times a day (QID) | ORAL | 0 refills | Status: DC | PRN

## 2024-01-29 MED ORDER — MORPHINE SULFATE ER 30 MG PO TBCR: 30.0000 mg | EXTENDED_RELEASE_TABLET | Freq: Two times a day (BID) | ORAL | 0 refills | Status: DC

## 2024-01-29 NOTE — Progress Notes (Signed)
 "  Subjective:    Patient ID: [name], female    DOB: 1960-03-25, 63 y.o.   MRN: [id number]  HPI: [name] is a 63 y.o. female who returns for follow up appointment for chronic pain and medication refill. She states her pain is located in her upper back mainly left side, lower back pain, bilateral hips and bilateral knee pain. She rates her pain 8. Her current exercise regime is walking and performing stretching exercises.  [name] Morphine  equivalent is 180.00 MME.   Last oral swab was performed on 11/28/2023, it was consistent.    Pain Inventory Average Pain 9 Pain Right Now 8 My pain is constant, sharp, burning, dull, stabbing, tingling, and aching  In the last 24 hours, has pain interfered with the following? General activity 10 Relation with others 10 Enjoyment of life 10 What TIME of day is your pain at its worst? varies Sleep (in general) Poor  Pain is worse with: walking, bending, standing, and some activites Pain improves with: rest, heat/ice, pacing activities, medication, TENS, and injections Relief from Meds: 5  Family History  Problem Relation Age of Onset   Depression Mother    Hypertension Mother    Anxiety disorder Mother    Obesity Mother    Varicose Veins Mother    Hypertension Father    Heart failure Father    Diabetes Father    Heart attack Father    Arthritis Father    Cancer Father    Obesity Father    Colon cancer Neg Hx    Esophageal cancer Neg Hx    Stomach cancer Neg Hx    Rectal cancer Neg Hx    Social History   Socioeconomic History   Marital status: Married    Spouse name: [name]   Number of children: 2   Years of education: some college   Highest education level: Some college, no degree  Occupational History   Occupation: On disability    Employer: UNEMPLOYED  Tobacco Use   Smoking status: Some Days    Current packs/day: 0.00    Types: Cigarettes    Last attempt to quit: 02/05/1973    Years since  quitting: 51.0   Smokeless tobacco: Never  Vaping Use   Vaping status: Never Used  Substance and Sexual Activity   Alcohol  use: No   Drug use: No   Sexual activity: Not Currently    Partners: Male    Birth control/protection: None  Other Topics Concern   Not on file  Social History Narrative   On disability. Lives with her husband. She has one adopted daughter. She likes to read in her free time. She does chair exercises for 30-45 mins everyday.   Social Drivers of Health   Tobacco Use: High Risk (12/26/2023)   Patient History    Smoking Tobacco Use: Some Days    Smokeless Tobacco Use: Never    Passive Exposure: Not on file  Financial Resource Strain: Medium Risk (01/23/2023)   Overall Financial Resource Strain (CARDIA)    Difficulty of Paying Living Expenses: Somewhat hard  Food Insecurity: No Food Insecurity (01/22/2023)   Hunger Vital Sign    Worried About Running Out of Food in the Last Year: Never true    Ran Out of Food in the Last Year: Never true  Transportation Needs: No Transportation Needs (01/23/2023)   PRAPARE - Administrator, Civil Service (Medical): No    Lack of Transportation (Non-Medical): No  Physical Activity: Unknown (01/22/2023)   Exercise Vital Sign    Days of Exercise per Week: 0 days    Minutes of Exercise per Session: Not on file  Stress: Stress Concern Present (01/23/2023)   [employer] of Occupational Health - Occupational Stress Questionnaire    Feeling of Stress : To some extent  Social Connections: Unknown (01/23/2023)   Social Connection and Isolation Panel    Frequency of Communication with Friends and Family: Patient declined    Frequency of Social Gatherings with Friends and Family: Patient declined    Attends Religious Services: Never    Database Administrator or Organizations: No    Attends Banker Meetings: Never    Marital Status: Married  Depression (PHQ2-9): Low Risk (11/28/2023)   Depression  (PHQ2-9)    PHQ-2 Score: 2  Alcohol  Screen: Low Risk (01/23/2023)   Alcohol  Screen    Last Alcohol  Screening Score (AUDIT): 0  Housing: Low Risk (01/22/2023)   Housing Stability Vital Sign    Unable to Pay for Housing in the Last Year: No    Number of Times Moved in the Last Year: 0    Homeless in the Last Year: No  Utilities: Not At Risk (01/23/2023)   AHC Utilities    Threatened with loss of utilities: No  Health Literacy: Adequate Health Literacy (01/23/2023)   B1300 Health Literacy    Frequency of need for help with medical instructions: Never   Past Surgical History:  Procedure Laterality Date   ABDOMINAL HYSTERECTOMY  06/2002   Basel Cell Carcinoma  06/2005, 07/2005   nasal tip and reconstruction   BREAST CYST ASPIRATION     CARPAL TUNNEL RELEASE  07/1993   both hands   COLONOSCOPY     COSMETIC SURGERY  Basal cell carcinoma of nose   HEMORROIDECTOMY  08/2001   JOINT REPLACEMENT  Left knee replacement   KNEE ARTHROPLASTY  09/2001, 05/2003   left knee, chondromalasia   KNEE ARTHROSCOPY  02/2003   Right knee, tisse release, chrondromalacia   REPLACEMENT TOTAL KNEE  12/2003   Left    THYROID  LOBECTOMY  01/2005   malignant area removed from right lobe   THYROID  SURGERY     thyroid  lobe removal   TONSILLECTOMY  09/1972   TUBAL LIGATION  10/1997   Ulnar nerve entrapment  03/1993   left elbow   Past Surgical History:  Procedure Laterality Date   ABDOMINAL HYSTERECTOMY  06/2002   Basel Cell Carcinoma  06/2005, 07/2005   nasal tip and reconstruction   BREAST CYST ASPIRATION     CARPAL TUNNEL RELEASE  07/1993   both hands   COLONOSCOPY     COSMETIC SURGERY  Basal cell carcinoma of nose   HEMORROIDECTOMY  08/2001   JOINT REPLACEMENT  Left knee replacement   KNEE ARTHROPLASTY  09/2001, 05/2003   left knee, chondromalasia   KNEE ARTHROSCOPY  02/2003   Right knee, tisse release, chrondromalacia   REPLACEMENT TOTAL KNEE  12/2003   Left    THYROID  LOBECTOMY  01/2005    malignant area removed from right lobe   THYROID  SURGERY     thyroid  lobe removal   TONSILLECTOMY  09/1972   TUBAL LIGATION  10/1997   Ulnar nerve entrapment  03/1993   left elbow   Past Medical History:  Diagnosis Date   Allergy    Anxiety    Cancer (HCC)    cancerous nodules on thyroid    Cancer (HCC)  basal cell Cancer-nose   Chronic pain syndrome    COPD (chronic obstructive pulmonary disease) (HCC)    Degeneration of lumbar or lumbosacral intervertebral disc    Depression    Facet syndrome, lumbar    GERD (gastroesophageal reflux disease)    Hyperlipidemia    Intervertebral lumbar disc disorder with myelopathy, lumbar region    Lumbosacral spondylosis without myelopathy    Migraine without aura, with intractable migraine, so stated, without mention of status migrainosus    Primary localized osteoarthrosis, lower leg    Thyroid  disease    hypothyroid   Unspecified musculoskeletal disorders and symptoms referable to neck    cervical/trapezius   BP 117/68   Pulse 94   Ht 5' 8.5 (1.74 m)   Wt 237 lb (107.5 kg)   SpO2 94%   BMI 35.51 kg/m   Opioid Risk Score:   Fall Risk Score:  `1  Depression screen [facility] 2/9     11/28/2023    8:42 AM 10/31/2023    9:00 AM 08/23/2023    9:48 AM 07/25/2023    9:45 AM 06/29/2023    9:15 AM 05/15/2023    9:34 AM 04/18/2023    8:57 AM  Depression screen PHQ 2/9  Decreased Interest 1 1 1 1 1 1 1   Down, Depressed, Hopeless 1 1 1 1 1 1 1   PHQ - 2 Score 2 2 2 2 2 2 2       Review of Systems  Musculoskeletal:  Positive for back pain.       Right knee pain B/L leg pain  All other systems reviewed and are negative.      Objective:   Physical Exam Vitals and nursing note reviewed.  Constitutional:      Appearance: Normal appearance.  Cardiovascular:     Rate and Rhythm: Normal rate and regular rhythm.     Pulses: Normal pulses.     Heart sounds: Normal heart sounds.  Pulmonary:     Effort: Pulmonary effort is normal.      Breath sounds: Normal breath sounds.  Musculoskeletal:     Comments: Normal Muscle Bulk and Muscle Testing Reveals:  Upper Extremities: Full ROM and Muscle Strength 5/5  Thoracic Paraspinal Tenderness: T-4- T-6 Mainly Left Side  Lumbar Paraspinal Tenderness: L-4-L-5 Bilateral Greater Trochanter Tenderness Lower Extremities: Decreased ROM and Muscle Strength 5/5 Bilateral Lower Extremities Flexion Produces Pain into her Bilateral Patella's Arises from Table slowly using cane for support Antalgic  Gait     Skin:    General: Skin is warm and dry.  Neurological:     Mental Status: She is alert and oriented to person, place, and time.  Psychiatric:        Mood and Affect: Mood normal.        Behavior: Behavior normal.          Assessment & Plan:  1.Chronic Left Thoracic Back Pain:  Continue HEP as Tolerated. Continue to Monitor. 01/29/2024 2.  Low Back Pain/ Lumbar Facet Arthropathy: Refilled: MSIR 15 mg one tablet every 6 hours as needed #100 and  MS Contin  60 mg and MS Contin   30 mg Q 12 hours to equal   90 mg  #60/60.SABRA  Continue Gabapentin  and Pamelor . 01/29/2024 We will continue the opioid monitoring program, this consists of regular clinic visits, examinations, urine drug screen, pill counts as well as use of [place]  Controlled Substance Reporting system. A 12 month History has been reviewed on the [place]  Controlled  Substance Reporting System on 01/29/2024. 3. Degenerative Disk Disease:Encouraged Continue HEP as Tolertated  and heat therapy. Continue Current Medication Regime.01/29/2024 4. Bilateral Greater Trochanteric Bursitis:  No complaints today. Continue current treatment regimen with Heat and Ice Therapy. 01/29/2024 5. Osteoarthritis of Bilateral Knees: R>L Continue current treatment modality with home exercise program and heat therapy. 01/29/2024 6. Migraine Headaches: Continue current medication regimen  Continue with  Migraine Journal. Aimovig  discontinued due  to Hives. Continue Lamictal  and Relpax . Continue to Monitor.  01/29/2024 7. Smoking Cessation: She has resume smoking, she was educated on smoking cessation, she verbalizes understanding. She had quit smoking on 08/25/2016. Continue to Monitor. 01/29/2024 8.Constipation: Continue  Current medication regime with Linzess . 01/29/2024 9. Muscle Spasm: S/P Trigger Point Injection with Dr Babs on 03/07/2023.Continue current medication regime with Tizanidine .01/29/2024 10. DepressionAnxiety/ Panic Attacks: Continue current medication regimen and treatment modality.  Celexa .Lamictal   and Counseling with Ronal Sink and  Dr. Geralene Psychiatrist. 01/29/2024 11. RSD: Continue with current medication Regime with Gabapentin . 01/29/2024 12. DeQuervain's Tenosynovitis: No complaints today. Contiue to Monitor. 01/29/2024 13. Polyneuropathy: Continue Gabapentin . Continue to monitor. 01/29/2024   F/U in 1 month      "

## 2024-02-02 ENCOUNTER — Other Ambulatory Visit: Payer: Self-pay | Admitting: Physical Medicine & Rehabilitation

## 2024-02-11 ENCOUNTER — Other Ambulatory Visit: Payer: Self-pay | Admitting: Physical Medicine & Rehabilitation

## 2024-02-15 ENCOUNTER — Other Ambulatory Visit: Payer: Self-pay | Admitting: Family Medicine

## 2024-02-15 DIAGNOSIS — E1165 Type 2 diabetes mellitus with hyperglycemia: Secondary | ICD-10-CM

## 2024-03-01 ENCOUNTER — Other Ambulatory Visit: Payer: Self-pay | Admitting: Registered Nurse

## 2024-03-03 ENCOUNTER — Ambulatory Visit: Admitting: Family Medicine

## 2024-03-06 ENCOUNTER — Other Ambulatory Visit: Payer: Self-pay | Admitting: Physical Medicine & Rehabilitation

## 2024-03-06 DIAGNOSIS — T402X5A Adverse effect of other opioids, initial encounter: Secondary | ICD-10-CM

## 2024-03-11 ENCOUNTER — Encounter: Admitting: Registered Nurse

## 2024-03-11 ENCOUNTER — Encounter: Payer: Self-pay | Admitting: Registered Nurse

## 2024-03-11 ENCOUNTER — Encounter: Payer: Self-pay | Admitting: *Deleted

## 2024-03-11 DIAGNOSIS — M1712 Unilateral primary osteoarthritis, left knee: Secondary | ICD-10-CM

## 2024-03-11 DIAGNOSIS — M1711 Unilateral primary osteoarthritis, right knee: Secondary | ICD-10-CM

## 2024-03-11 DIAGNOSIS — G8929 Other chronic pain: Secondary | ICD-10-CM

## 2024-03-11 DIAGNOSIS — M47816 Spondylosis without myelopathy or radiculopathy, lumbar region: Secondary | ICD-10-CM

## 2024-03-11 DIAGNOSIS — G894 Chronic pain syndrome: Secondary | ICD-10-CM

## 2024-03-11 MED ORDER — MORPHINE SULFATE ER 60 MG PO TBCR: 60.0000 mg | EXTENDED_RELEASE_TABLET | Freq: Two times a day (BID) | ORAL | 0 refills | Status: DC

## 2024-03-11 NOTE — Progress Notes (Unsigned)
 [name] was not seen today due to Fidela having to be out today.

## 2024-03-11 NOTE — Telephone Encounter (Signed)
 Rx for 60mg  msCR sent

## 2024-03-12 ENCOUNTER — Other Ambulatory Visit: Payer: Self-pay | Admitting: Family Medicine

## 2024-03-12 ENCOUNTER — Encounter: Attending: Physical Medicine and Rehabilitation | Admitting: Registered Nurse

## 2024-03-12 VITALS — Ht 68.5 in | Wt 237.0 lb

## 2024-03-12 DIAGNOSIS — G8929 Other chronic pain: Secondary | ICD-10-CM

## 2024-03-12 DIAGNOSIS — M546 Pain in thoracic spine: Secondary | ICD-10-CM | POA: Diagnosis not present

## 2024-03-12 DIAGNOSIS — M7061 Trochanteric bursitis, right hip: Secondary | ICD-10-CM | POA: Diagnosis not present

## 2024-03-12 DIAGNOSIS — Z5181 Encounter for therapeutic drug level monitoring: Secondary | ICD-10-CM

## 2024-03-12 DIAGNOSIS — M1711 Unilateral primary osteoarthritis, right knee: Secondary | ICD-10-CM

## 2024-03-12 DIAGNOSIS — Z79891 Long term (current) use of opiate analgesic: Secondary | ICD-10-CM

## 2024-03-12 DIAGNOSIS — M47816 Spondylosis without myelopathy or radiculopathy, lumbar region: Secondary | ICD-10-CM | POA: Diagnosis not present

## 2024-03-12 DIAGNOSIS — G894 Chronic pain syndrome: Secondary | ICD-10-CM

## 2024-03-12 DIAGNOSIS — M7062 Trochanteric bursitis, left hip: Secondary | ICD-10-CM

## 2024-03-12 DIAGNOSIS — M1712 Unilateral primary osteoarthritis, left knee: Secondary | ICD-10-CM | POA: Diagnosis not present

## 2024-03-12 DIAGNOSIS — M2550 Pain in unspecified joint: Secondary | ICD-10-CM | POA: Diagnosis not present

## 2024-03-12 DIAGNOSIS — G629 Polyneuropathy, unspecified: Secondary | ICD-10-CM | POA: Diagnosis not present

## 2024-03-12 DIAGNOSIS — E1165 Type 2 diabetes mellitus with hyperglycemia: Secondary | ICD-10-CM

## 2024-03-12 MED ORDER — MORPHINE SULFATE 15 MG PO TABS: 15.0000 mg | ORAL_TABLET | Freq: Four times a day (QID) | ORAL | 0 refills | Status: AC | PRN

## 2024-03-12 MED ORDER — MORPHINE SULFATE ER 30 MG PO TBCR: 30.0000 mg | EXTENDED_RELEASE_TABLET | Freq: Two times a day (BID) | ORAL | 0 refills | Status: AC

## 2024-03-12 MED ORDER — MORPHINE SULFATE ER 60 MG PO TBCR: 60.0000 mg | EXTENDED_RELEASE_TABLET | Freq: Two times a day (BID) | ORAL | 0 refills | Status: AC
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
## 2024-03-13 ENCOUNTER — Other Ambulatory Visit: Payer: Self-pay | Admitting: Physical Medicine & Rehabilitation

## 2024-03-17 ENCOUNTER — Ambulatory Visit: Admitting: Family Medicine

## 2024-04-14 ENCOUNTER — Encounter: Admitting: Registered Nurse
